# Patient Record
Sex: Female | Born: 1935 | Race: White | Hispanic: No | Smoking: Former smoker
Health system: Southern US, Community
[De-identification: ages and names within clinical notes are randomized; demographics above are authoritative.]

## PROBLEM LIST (undated history)

## (undated) DIAGNOSIS — I10 Essential (primary) hypertension: Secondary | ICD-10-CM

## (undated) DIAGNOSIS — M1990 Unspecified osteoarthritis, unspecified site: Secondary | ICD-10-CM

## (undated) DIAGNOSIS — E785 Hyperlipidemia, unspecified: Secondary | ICD-10-CM

## (undated) DIAGNOSIS — E119 Type 2 diabetes mellitus without complications: Secondary | ICD-10-CM

## (undated) DIAGNOSIS — R079 Chest pain, unspecified: Secondary | ICD-10-CM

## (undated) DIAGNOSIS — K529 Noninfective gastroenteritis and colitis, unspecified: Secondary | ICD-10-CM

## (undated) DIAGNOSIS — Z9889 Other specified postprocedural states: Secondary | ICD-10-CM

## (undated) DIAGNOSIS — R112 Nausea with vomiting, unspecified: Secondary | ICD-10-CM

## (undated) DIAGNOSIS — M109 Gout, unspecified: Secondary | ICD-10-CM

## (undated) HISTORY — DX: Chest pain, unspecified: R07.9

## (undated) HISTORY — PX: PARATHYROIDECTOMY: SHX19

## (undated) HISTORY — PX: VAGINAL HYSTERECTOMY: SUR661

## (undated) HISTORY — PX: APPENDECTOMY: SHX54

## (undated) HISTORY — PX: CHOLECYSTECTOMY: SHX55

## (undated) HISTORY — DX: Hyperlipidemia, unspecified: E78.5

## (undated) HISTORY — PX: CARPAL TUNNEL RELEASE: SHX101

## (undated) HISTORY — PX: TONSILLECTOMY: SUR1361

## (undated) HISTORY — PX: CATARACT EXTRACTION W/ INTRAOCULAR LENS  IMPLANT, BILATERAL: SHX1307

## (undated) HISTORY — PX: KNEE ARTHROSCOPY: SHX127

---

## 1997-07-05 ENCOUNTER — Other Ambulatory Visit: Admission: RE | Admit: 1997-07-05 | Discharge: 1997-07-05 | Payer: Self-pay | Admitting: Internal Medicine

## 1997-07-26 ENCOUNTER — Ambulatory Visit (HOSPITAL_COMMUNITY): Admission: RE | Admit: 1997-07-26 | Discharge: 1997-07-26 | Payer: Self-pay | Admitting: Gastroenterology

## 1998-04-03 ENCOUNTER — Encounter: Payer: Self-pay | Admitting: Surgery

## 1998-04-05 ENCOUNTER — Ambulatory Visit (HOSPITAL_COMMUNITY): Admission: RE | Admit: 1998-04-05 | Discharge: 1998-04-06 | Payer: Self-pay | Admitting: Surgery

## 1998-04-05 ENCOUNTER — Encounter: Payer: Self-pay | Admitting: Surgery

## 1998-04-07 ENCOUNTER — Emergency Department (HOSPITAL_COMMUNITY): Admission: EM | Admit: 1998-04-07 | Discharge: 1998-04-07 | Payer: Self-pay | Admitting: Internal Medicine

## 1999-05-01 ENCOUNTER — Other Ambulatory Visit: Admission: RE | Admit: 1999-05-01 | Discharge: 1999-05-01 | Payer: Self-pay | Admitting: Internal Medicine

## 1999-07-04 ENCOUNTER — Inpatient Hospital Stay (HOSPITAL_COMMUNITY): Admission: RE | Admit: 1999-07-04 | Discharge: 1999-07-05 | Payer: Self-pay | Admitting: Orthopedic Surgery

## 2000-08-22 ENCOUNTER — Emergency Department (HOSPITAL_COMMUNITY): Admission: EM | Admit: 2000-08-22 | Discharge: 2000-08-22 | Payer: Self-pay

## 2000-12-10 ENCOUNTER — Encounter: Payer: Self-pay | Admitting: Orthopedic Surgery

## 2000-12-17 ENCOUNTER — Inpatient Hospital Stay (HOSPITAL_COMMUNITY): Admission: RE | Admit: 2000-12-17 | Discharge: 2000-12-18 | Payer: Self-pay | Admitting: Orthopedic Surgery

## 2001-01-11 ENCOUNTER — Encounter: Admission: RE | Admit: 2001-01-11 | Discharge: 2001-02-24 | Payer: Self-pay | Admitting: Orthopedic Surgery

## 2008-01-14 ENCOUNTER — Emergency Department (HOSPITAL_COMMUNITY): Admission: EM | Admit: 2008-01-14 | Discharge: 2008-01-14 | Payer: Self-pay | Admitting: Emergency Medicine

## 2008-06-16 ENCOUNTER — Encounter: Payer: Self-pay | Admitting: Gastroenterology

## 2010-06-14 NOTE — H&P (Signed)
[facility]  Patient:    Allison Duffy, Allison Duffy. Visit Number: [id number] MRN: [id number]          Service Type: Attending:  Georges Lynch. Darrelyn Hillock, M.D. Dictated by:   Druscilla Brownie. Shela Nevin, P.A. Adm. Date:  12/17/00   CC:         Richard A. Jacky Kindle, M.D.   History and Physical  DATE OF BIRTH:  10-08-1926. DATE OF HISTORY AND PHYSICAL:  December 10, 2000.  CHIEF COMPLAINT:  "Pain in my right shoulder."  HISTORY OF PRESENT ILLNESS:  This 77 year old lady has been seen by Dr. Darrelyn Hillock for continuing and progressive problems concerning the right shoulder. She has had a fall some time ago and has had persistent pain to the right shoulder.  MRI has shown a severe impingement syndrome.  She has limitation of the range of motion, particularly on abduction.  She has had previous left shoulder surgery.  She also has knee problems.  We plan to do a total knee replacement arthroplasty in the future; however, she will need to have her shoulders in good shape in order to walk on a walker postoperatively, so it was decided to go ahead with decompression of the impingement of the right rotator cuff and examination of the cuff during surgery and then decide if repair is indicated.  The patient has been cleared preoperatively by Dr. Geoffry Paradise for surgery.  PAST MEDICAL HISTORY:  Hysterectomy in 1969.  Cholecystectomy in 1975. Parathyroid excision in 1995.  Medically, she has hypertension.  ALLERGIES:  CODEINE and SULFA.  MEDICATIONS:  Lotrel 5/10 one q.d., Premarin 0.625 mg one q.d., calcium, magnesium, and zinc 2000 units per day, Vioxx 25 mg one q.d.  SOCIAL HISTORY:  The patient neither smokes nor drinks.  FAMILY HISTORY:  Positive for heart disease in her mother and father, diabetes in her mother and father, emphysema in the father.  REVIEW OF SYSTEMS:  CENTRAL NERVOUS SYSTEM:  No seizure disorder, paralysis, numbness, or double vision.  RESPIRATORY:  No  productive cough or hemoptysis, no shortness of breath.  CARDIOVASCULAR:  No chest pain, no angina, no orthopnea.  GASTROINTESTINAL:  No nausea, vomiting, melena, or bloody stools. GENITOURINARY:  No discharge, dysuria, or hematuria.  MUSCULOSKELETAL: Primarily in present illness with her right shoulder and knee.  PHYSICAL EXAMINATION:  VITAL SIGNS:  Blood pressure 150/82, pulse 82, respirations are 12.  GENERAL:  Alert and cooperative, friendly 77 year old white female.  HEENT:  Normocephalic, PERRLA, EOMs intact.  Oropharynx is clear.  CHEST:  Clear to auscultation.  No rhonchi, no rales.  CARDIAC:  Regular rate and rhythm.  No murmurs are heard.  ABDOMEN:  Soft, nontender.  Liver and spleen not felt.  GENITALIA, RECTAL, PELVIC, BREASTS:  Not done, not pertinent to present illness.  EXTREMITIES:  Right shoulder as in present illness above.  ADMITTING DIAGNOSES: 1. Impingement, right shoulder, possible tear of the rotator cuff. 2. Hypertension.  PLAN:  The patient will undergo subacromial decompression with possible repair of the right rotator cuff.  If she has any medical problems, we will certainly ask Dr. Jacky Kindle to follow along with [place] during this patients hospitalization. Dictated by:   Druscilla Brownie. Shela Nevin, P.A. Attending:  Georges Lynch. Darrelyn Hillock, M.D. DD:  12/10/00 TD:  12/10/00 Job: [id number]

## 2010-06-14 NOTE — Op Note (Signed)
[facility]  Patient:    MAXINE, FREDMAN [facility] Visit Number: [id number] MRN: [id number]          Service Type: SUR Location: 4W [id number] 01 Attending Physician:  Skip Mayer Dictated by:   Georges Lynch Darrelyn Hillock, M.D. Proc. Date: 12/17/00 Admit Date:  12/17/2000                             Operative Report  PREOPERATIVE DIAGNOSES: 1. Severe impingement syndrome, right shoulder. 2. Rule out possibility of a tear of the rotator cuff. 3. Chronic subdeltoid bursitis, right shoulder.  POSTOPERATIVE DIAGNOSES: 1. Severe impingement syndrome, right shoulder. 2. Rule out possibility of tear of rotator cuff, rotator cuff intact. 3. Chronic subdeltoid bursitis, right shoulder.  OPERATION: 1. Open decompression of the right shoulder by performing an acromionectomy    and acromioplasty. 2. Exploration of rotator cuff on the right. 3. Excision of subdeltoid bursa on the right.  SURGEON:  Georges Lynch. Darrelyn Hillock, M.D.  ASSISTANTAlinda Money ________, P.A.  DESCRIPTION OF PROCEDURE:  Under general anesthesia, routine orthopedic prep and draping of the right shoulder was carried out.  The patient had 1 g of IV Ancef preoperative.  At this time, the incision was made over the anterior aspect of the right shoulder.  Bleeders were identified and cauterized.  I then stripped the deltoid tendon from the acromion in the usual fashion.  I split the deltoid muscle proximally.  I then went down and inserted my self-retaining retractors and cauterized the bleeders.  At this time, I noted that she had severe down sloping of her acromion.  There literally was no space between the acromion and the shoulder per se.  I then inserted a Bennett retractor and did a partial acromionectomy utilizing the oscillating saw.  I then utilized the bur to even out the acromion cut.  I thoroughly irrigated out the area.  I bone waxed the raw end of the acromion where we made our bone cut.  I  thoroughly irrigated out the area again.  I inserted some Gelfoam posteriorly.  I ______ the subdeltoid bursa and I did explore the rotator cuff and it was intact.  It was slightly abraded, but intact.  I then reapproximated the deltoid tendon and muscle in the usual fashion.  The remaining part of the wound was closed in the usual fashion.  Sterile dressings were applied and the patient was placed in a sling. Dictated by:   Georges Lynch Darrelyn Hillock, M.D. Attending Physician:  Skip Mayer DD:  12/17/00 TD:  12/18/00 Job: [id number]

## 2010-06-14 NOTE — Discharge Summary (Signed)
[facility]  Patient:    Allison Duffy, Allison Duffy                     MRN: [id number] Adm. Date:  [id number] Disc. Date: [id number] Attending:  Skip Mayer                           Discharge Summary  ADMISSION DIAGNOSES: 1.  Left rotator cuff tear. 2.  Hypertension. 3.  Osteoporosis.  DISCHARGE DIAGNOSES: 1.  Severe impingement syndrome, left shoulder with abrasion rotator cuff     tear. 2.  Severe chronic subdeltoid bursitis of the shoulder, status post partial     acromionectomy and acromioplasty and decompression of the left shoulder     and excision of large chronic subdeltoid bursa. 3.  Hypertension. 4.  Osteoporosis.  PROCEDURE:  The patient was taken to the operating room on July 04, 1999 and underwent a partial acromionectomy and acromioplasty in order to decompress the left shoulder.  She also underwent an excision of a large chronic subdeltoid bursa.  The surgeon was Dr. Worthy Rancher and assistant was Maud Deed, P.A.C.  The surgery was done under general anesthesia.  CONSULTS:  None.  BRIEF HISTORY:  The patient is a 77 year old female who has been having approximately one month history of left shoulder pain and dysfunction.  She denies any injury, accident or trauma.  She has complained of difficulty getting dressed with overhead activities.  MRI showed small tears of the rotator cuff.  It was felt that she would benefit from surgical intervention. The risks and benefits of the procedure were discussed with the patient.  She was subsequently admitted to [facility].  LABORATORY:  CBC on admission showed a hemoglobin 12.8, hematocrit 37.7, white blood cell count of 9.7, red blood cell count 3.97.  Differential showed monos slightly low at 5, otherwise differential was within normal limits.  PT and PTT on admission were 13.6 and 24 respectively with an INR of 1.1.  Chem panel on admission revealed a slightly elevated  glucose at 126, slightly decreased potassium at 3.2.  The remaining chem panel was all within normal limits.  Urinalysis was negative.  I do not see a chest x-ray or EKG report on this chart at this time.  HOSPITAL COURSE:  The patient was admitted to the [facility] and taken to the operating room and underwent the above stated procedure without complications.  The patient tolerated the procedure well.  Later, she was transferred to the recovery room and then to the orthopedic floor to continue postoperative care.  The patient was placed on p.o. and intramuscularly analgesics for pain control.  The patient was feeling quite comfortable following surgery.  On postoperative day number one, the patient was doing well, had been up and about, voiding okay and is ready for discharge home.  DISCHARGE PLAN:  The patient was discharged home on July 05, 1999.  MEDICATIONS ON DISCHARGE: 1.  Percocet, #40, p.r.n. pain. 2.  Robaxin 500 mg, #40, p.r.n. spasm.  DIET:  As tolerated.  ACTIVITY:  Up ad-lib.  Shower five days after surgery.  FOLLOW-UP:  The patient is to follow-up in two weeks.  DISPOSITION:  Home.  CONDITION ON DISCHARGE:  Improved. DD:  07/29/99 TD:  07/29/99 Job: [id number]

## 2010-06-14 NOTE — Op Note (Signed)
[name]  Patient:    Allison Duffy, Allison Duffy                     MRN: [id number] Proc. Date: 07/04/99 Adm. Date:  [id number] Disc. Date: [id number] Attending:  Skip Mayer CC:         Georges Lynch. Darrelyn Hillock, M.D.                           Operative Report  SURGEON:  Georges Lynch. Darrelyn Hillock, M.D.  ASSISTANT:  Della Goo, P.A.  PREOPERATIVE DIAGNOSES: 1. Severe impingement syndrome of the left shoulder with abrasion of her    rotator cuff tendon. 2. Severe chronic subdeltoid bursitis - left shoulder.  POSTOPERATIVE DIAGNOSES: 1. Severe impingement syndrome of the left shoulder with abrasion of her    rotator cuff tendon. 2. Severe chronic subdeltoid bursitis - left shoulder.  OPERATION: 1. Partial acromionectomy and acromioplasty in order to decompress her left    shoulder. 2. Excision of a large chronic subdeltoid bursa.  PROCEDURE:  Under general anesthesia, a routine orthopedic prep and draping of the left shoulder was carried out.  At this time, a incision was made over the anterior aspect of the left shoulder.  Bleeders were identified and cauterized.  She had 1 g of IV Ancef preoperatively.  Following this, I stripped the deltoid tendon by sharp dissection from the acromion.  At this particular time, we noted there was a severe impingement syndrome and she had literally abraded her rotator cuff, but did not tear the cuff.  So, we then protected the cuff, first identified the subdeltoid bursa.  She had a chronic bursitis.  I excised the subdeltoid bursa.  The bursa was then removed as I stated.  We then inserted the Bennett retractor.  Once the [facility] retractor was inserted up under the acromion, I did a partial acromionectomy with the oscillating saw.  I then utilized the bur to complete the acromioplasty.   We now had good freedom of motion of the shoulder.  The tendon was slightly abraded, but not torn and no repair was necessary.   Thoroughly irrigated out the area.  We bone waxed the acromion.  We then reapproximated the deltoid tendon and the muscle in the usual fashion.  Subcu was closed with 0 Vicryl and skin was closed with metal staples.  I injected about 10 cc of 0.5% Marcaine and epinephrine into the shoulder.  After sterile dressing was applied, she was placed in a sling. DD:  07/04/99 TD:  07/08/99 Job: [id number]

## 2010-06-14 NOTE — H&P (Signed)
[facility]  Patient:    Allison Duffy, Allison Duffy                       MRN: [id number] Adm. Date:  07/04/99 Attending:  Georges Lynch. Darrelyn Hillock, M.D. Dictator:   Grayland Jack, P.A.                         History and Physical  DATE OF BIRTH:  [birth year]  CHIEF COMPLAINT:  Left shoulder pain.  HISTORY OF PRESENT ILLNESS:  Allison Duffy is a 77 year old female with an approximately one-month history of left shoulder pain and dysfunction.  She denies any injury or trauma to the area.  She does complain of difficulty getting dressed and any overhead activities.  MRIs reveal multiple small tears of the rotator cuff. It is felt that the patient would best benefit from surgical intervention.  The  risks, benefits, and complications of surgery were discussed with the patient in detail, and she agreed to proceed.  ALLERGIES:  CODEINE.  CURRENT MEDICATIONS: 1. Lotrel 5/10 one tablet p.o. q.d. 2. Premarin 0.625 mg p.o. q.d. 3. Celebrex 200 mg p.o. q.d. 4. Fosamax 70 mg p.o. once q. week. 5. Calcium 1200 mg p.o. q.d. 6. Vitamin A, E, and D p.o. q.d. 7. Please note, the patient was recently on a dosepak for poison oak, taking    her last dose on Tuesday, Jun 25, 1999.  PAST MEDICAL HISTORY: 1. Significant for hypertension. 2. Osteoporosis.  PAST SURGICAL HISTORY: 1. Significant for a hysterectomy. 2. Cholecystectomy. 3. Parathyroid surgery. 4. A bladder tack.  SOCIAL HISTORY:  The patient is a widow.  She currently lives alone.  She states that her daughter will come and stay with her over the weekend.  She denies any  tobacco or alcohol use.  FAMILY HISTORY:  Significant diabetes mellitus and heart disease.  REVIEW OF SYSTEMS:   GENERAL:  The patient denies fever, chills, weight changes, or malaise.  HEENT:  The patient denies headaches, visual changes, ear pain or pressure, nasal congestion or discharge, sore throats, or dysphagia. RESPIRATORY: The  patient denies a productive cough, hemoptysis, or shortness of breath. CARDIAC:  The patient denies chest pain, palpitations, angina, or dyspnea on exertion.  GI:  The patient denies abdominal pain, nausea, vomiting, constipation, diarrhea, or bloody stools.  GU:  The patient denies dysuria, hematuria, increase in frequency or hesitancy.  MUSCULOSKELETAL:  See the HPI.  HEMATOLOGIC:  The patient denies anemia or bleeding tendencies.  NEURO:  The patient denies a history of TIAs or CVAs.  PHYSICAL EXAMINATION:  VITAL SIGNS:  Temperature afebrile, pulse 78, respirations 18, blood pressure 148/90.  GENERAL:  This is a well-developed, well-nourished 77 year old female, in no acute distress.  HEENT:  The patient is normocephalic, atraumatic.  The patient does use corrective lenses.  Pupils equal, round, reactive to light.  Extraocular muscles intact.  NECK:  Supple, no jugular venous distention, no lymphadenopathy, no carotid bruits appreciated.  CHEST:  Clear to auscultation.  No rales, no rhonchi, no wheezing bilaterally.  HEART:  A regular rate and rhythm, no rubs, gallops, or murmurs appreciated.  ABDOMEN:  Positive bowel sounds, soft, nontender.  No organomegaly appreciated.  BREASTS/RECTAL/GU:  Not done, not pertinent to the present illness.  EXTREMITIES:  Sensation intact to bilateral upper extremities.  Radial pulses 2+ bilaterally.  The patient has an extremely decreased active range of motion of he left shoulder, secondary  to pain.  IMPRESSION: 1. Left rotator cuff tear, multiple. 2. Hypertension. 3. Osteoporosis.  PLAN:  This patient will be admitted to [facility] on June , 2001, to undergo a left rotator cuff repair with Dr. Georges Lynch. Gioffre.  The postoperative course, including physical therapy, has been discussed with the patient in detail.  All questions have been encouraged and answered. DD:  06/27/99 TD:  06/27/99 Job:  [id number]

## 2010-07-22 NOTE — Procedures (Signed)
Summary: Recall / [place] [facility]  Recall / [facility]   Imported By: Lennie Odor 06/29/2009 15:48:12  _____________________________________________________________________  External Attachment:    Type:   Image     Comment:   External Document

## 2010-08-07 ENCOUNTER — Other Ambulatory Visit: Payer: Self-pay | Admitting: Dermatology

## 2010-11-01 LAB — DIFFERENTIAL
Eosinophils Absolute: 0.1 10*3/uL (ref 0.0–0.7)
Lymphs Abs: 1.7 10*3/uL (ref 0.7–4.0)
Neutro Abs: 5.3 10*3/uL (ref 1.7–7.7)
Neutrophils Relative %: 70 % (ref 43–77)

## 2010-11-01 LAB — POCT CARDIAC MARKERS
CKMB, poc: 6.8 ng/mL (ref 1.0–8.0)
Myoglobin, poc: 116 ng/mL (ref 12–200)

## 2010-11-01 LAB — POCT I-STAT, CHEM 8
Creatinine, Ser: 0.9 mg/dL (ref 0.4–1.2)
HCT: 44 % (ref 36.0–46.0)
Hemoglobin: 15 g/dL (ref 12.0–15.0)
Potassium: 3.5 mEq/L (ref 3.5–5.1)
Sodium: 140 mEq/L (ref 135–145)

## 2010-11-01 LAB — URINALYSIS, ROUTINE W REFLEX MICROSCOPIC
Hgb urine dipstick: NEGATIVE
Nitrite: NEGATIVE
Protein, ur: NEGATIVE mg/dL
Urobilinogen, UA: 0.2 mg/dL (ref 0.0–1.0)

## 2010-11-01 LAB — CBC
MCV: 98.2 fL (ref 78.0–100.0)
Platelets: 214 10*3/uL (ref 150–400)
WBC: 7.6 10*3/uL (ref 4.0–10.5)

## 2011-02-03 DIAGNOSIS — IMO0002 Reserved for concepts with insufficient information to code with codable children: Secondary | ICD-10-CM | POA: Diagnosis not present

## 2011-02-03 DIAGNOSIS — M5126 Other intervertebral disc displacement, lumbar region: Secondary | ICD-10-CM | POA: Diagnosis not present

## 2011-02-03 DIAGNOSIS — I10 Essential (primary) hypertension: Secondary | ICD-10-CM | POA: Diagnosis not present

## 2011-02-03 DIAGNOSIS — M549 Dorsalgia, unspecified: Secondary | ICD-10-CM | POA: Diagnosis not present

## 2011-02-03 DIAGNOSIS — N182 Chronic kidney disease, stage 2 (mild): Secondary | ICD-10-CM | POA: Diagnosis not present

## 2011-02-03 DIAGNOSIS — E1129 Type 2 diabetes mellitus with other diabetic kidney complication: Secondary | ICD-10-CM | POA: Diagnosis not present

## 2011-02-25 ENCOUNTER — Ambulatory Visit: Payer: BC Managed Care – PPO | Attending: Internal Medicine | Admitting: Physical Therapy

## 2011-02-25 DIAGNOSIS — M545 Low back pain, unspecified: Secondary | ICD-10-CM | POA: Insufficient documentation

## 2011-02-25 DIAGNOSIS — IMO0001 Reserved for inherently not codable concepts without codable children: Secondary | ICD-10-CM | POA: Insufficient documentation

## 2011-03-03 ENCOUNTER — Ambulatory Visit: Payer: Medicare Other | Attending: Internal Medicine | Admitting: Physical Therapy

## 2011-03-03 DIAGNOSIS — M545 Low back pain, unspecified: Secondary | ICD-10-CM | POA: Insufficient documentation

## 2011-03-03 DIAGNOSIS — IMO0001 Reserved for inherently not codable concepts without codable children: Secondary | ICD-10-CM | POA: Diagnosis not present

## 2011-03-05 ENCOUNTER — Ambulatory Visit: Payer: Medicare Other | Admitting: Physical Therapy

## 2011-03-10 ENCOUNTER — Ambulatory Visit: Payer: Medicare Other | Admitting: Physical Therapy

## 2011-03-12 ENCOUNTER — Encounter: Payer: BC Managed Care – PPO | Admitting: Physical Therapy

## 2011-05-05 DIAGNOSIS — E1129 Type 2 diabetes mellitus with other diabetic kidney complication: Secondary | ICD-10-CM | POA: Diagnosis not present

## 2011-05-05 DIAGNOSIS — N182 Chronic kidney disease, stage 2 (mild): Secondary | ICD-10-CM | POA: Diagnosis not present

## 2011-05-05 DIAGNOSIS — E785 Hyperlipidemia, unspecified: Secondary | ICD-10-CM | POA: Diagnosis not present

## 2011-05-05 DIAGNOSIS — I10 Essential (primary) hypertension: Secondary | ICD-10-CM | POA: Diagnosis not present

## 2011-05-12 DIAGNOSIS — Z1231 Encounter for screening mammogram for malignant neoplasm of breast: Secondary | ICD-10-CM | POA: Diagnosis not present

## 2011-09-22 DIAGNOSIS — E1129 Type 2 diabetes mellitus with other diabetic kidney complication: Secondary | ICD-10-CM | POA: Diagnosis not present

## 2011-09-22 DIAGNOSIS — E669 Obesity, unspecified: Secondary | ICD-10-CM | POA: Diagnosis not present

## 2011-09-22 DIAGNOSIS — N182 Chronic kidney disease, stage 2 (mild): Secondary | ICD-10-CM | POA: Diagnosis not present

## 2011-09-22 DIAGNOSIS — E785 Hyperlipidemia, unspecified: Secondary | ICD-10-CM | POA: Diagnosis not present

## 2011-09-30 DIAGNOSIS — H04129 Dry eye syndrome of unspecified lacrimal gland: Secondary | ICD-10-CM | POA: Diagnosis not present

## 2011-09-30 DIAGNOSIS — H3530 Unspecified macular degeneration: Secondary | ICD-10-CM | POA: Diagnosis not present

## 2011-09-30 DIAGNOSIS — Z961 Presence of intraocular lens: Secondary | ICD-10-CM | POA: Diagnosis not present

## 2011-11-18 DIAGNOSIS — Z23 Encounter for immunization: Secondary | ICD-10-CM | POA: Diagnosis not present

## 2012-01-27 DIAGNOSIS — E1129 Type 2 diabetes mellitus with other diabetic kidney complication: Secondary | ICD-10-CM | POA: Diagnosis not present

## 2012-01-27 DIAGNOSIS — E785 Hyperlipidemia, unspecified: Secondary | ICD-10-CM | POA: Diagnosis not present

## 2012-01-27 DIAGNOSIS — I10 Essential (primary) hypertension: Secondary | ICD-10-CM | POA: Diagnosis not present

## 2012-02-02 DIAGNOSIS — Z0000 Encounter for general adult medical examination without abnormal findings: Secondary | ICD-10-CM | POA: Diagnosis not present

## 2012-02-02 DIAGNOSIS — I10 Essential (primary) hypertension: Secondary | ICD-10-CM | POA: Diagnosis not present

## 2012-02-02 DIAGNOSIS — Z1212 Encounter for screening for malignant neoplasm of rectum: Secondary | ICD-10-CM | POA: Diagnosis not present

## 2012-02-02 DIAGNOSIS — E1129 Type 2 diabetes mellitus with other diabetic kidney complication: Secondary | ICD-10-CM | POA: Diagnosis not present

## 2012-02-02 DIAGNOSIS — E669 Obesity, unspecified: Secondary | ICD-10-CM | POA: Diagnosis not present

## 2012-04-30 DIAGNOSIS — M19039 Primary osteoarthritis, unspecified wrist: Secondary | ICD-10-CM | POA: Diagnosis not present

## 2012-05-08 ENCOUNTER — Emergency Department (HOSPITAL_COMMUNITY): Payer: Medicare Other

## 2012-05-08 ENCOUNTER — Emergency Department (HOSPITAL_COMMUNITY)
Admission: EM | Admit: 2012-05-08 | Discharge: 2012-05-09 | Disposition: A | Discharge status: Home / Self Care | Payer: Medicare Other | Attending: Emergency Medicine | Admitting: Emergency Medicine

## 2012-05-08 ENCOUNTER — Encounter (HOSPITAL_COMMUNITY): Payer: Self-pay | Admitting: Emergency Medicine

## 2012-05-08 DIAGNOSIS — I10 Essential (primary) hypertension: Secondary | ICD-10-CM | POA: Diagnosis not present

## 2012-05-08 DIAGNOSIS — E119 Type 2 diabetes mellitus without complications: Secondary | ICD-10-CM | POA: Diagnosis not present

## 2012-05-08 DIAGNOSIS — M79609 Pain in unspecified limb: Secondary | ICD-10-CM | POA: Diagnosis not present

## 2012-05-08 DIAGNOSIS — L03116 Cellulitis of left lower limb: Secondary | ICD-10-CM

## 2012-05-08 DIAGNOSIS — L03119 Cellulitis of unspecified part of limb: Secondary | ICD-10-CM | POA: Diagnosis not present

## 2012-05-08 DIAGNOSIS — S99919A Unspecified injury of unspecified ankle, initial encounter: Secondary | ICD-10-CM | POA: Diagnosis not present

## 2012-05-08 DIAGNOSIS — L02619 Cutaneous abscess of unspecified foot: Secondary | ICD-10-CM | POA: Diagnosis not present

## 2012-05-08 HISTORY — DX: Essential (primary) hypertension: I10

## 2012-05-08 LAB — CBC WITH DIFFERENTIAL/PLATELET
Basophils Absolute: 0.1 10*3/uL (ref 0.0–0.1)
Basophils Relative: 1 % (ref 0–1)
Eosinophils Absolute: 0.2 10*3/uL (ref 0.0–0.7)
Eosinophils Relative: 2 % (ref 0–5)
HCT: 40 % (ref 36.0–46.0)
Hemoglobin: 14.3 g/dL (ref 12.0–15.0)
Lymphocytes Relative: 37 % (ref 12–46)
Lymphs Abs: 3.5 10*3/uL (ref 0.7–4.0)
MCH: 33.3 pg (ref 26.0–34.0)
MCHC: 35.8 g/dL (ref 30.0–36.0)
MCV: 93 fL (ref 78.0–100.0)
Monocytes Absolute: 0.8 10*3/uL (ref 0.1–1.0)
Monocytes Relative: 9 % (ref 3–12)
Neutro Abs: 4.9 10*3/uL (ref 1.7–7.7)
Neutrophils Relative %: 52 % (ref 43–77)
Platelets: 230 10*3/uL (ref 150–400)
RBC: 4.3 MIL/uL (ref 3.87–5.11)
RDW: 12.1 % (ref 11.5–15.5)
WBC: 9.5 10*3/uL (ref 4.0–10.5)

## 2012-05-08 LAB — BASIC METABOLIC PANEL
BUN: 21 mg/dL (ref 6–23)
CO2: 25 mEq/L (ref 19–32)
Calcium: 9.8 mg/dL (ref 8.4–10.5)
Chloride: 98 mEq/L (ref 96–112)
Creatinine, Ser: 0.73 mg/dL (ref 0.50–1.10)
GFR calc Af Amer: 89 mL/min — ABNORMAL LOW (ref >90)
GFR calc non Af Amer: 77 mL/min — ABNORMAL LOW (ref >90)
Glucose, Bld: 156 mg/dL — ABNORMAL HIGH (ref 70–99)
Potassium: 4 mEq/L (ref 3.5–5.1)
Sodium: 138 mEq/L (ref 135–145)

## 2012-05-08 MED ORDER — CLINDAMYCIN PHOSPHATE 600 MG/50ML IV SOLN
600.0000 mg | Freq: Once | INTRAVENOUS | Status: AC
  Administered 2012-05-08: 600 mg via INTRAVENOUS
  Filled 2012-05-08: qty 50

## 2012-05-08 MED ORDER — ACETAMINOPHEN 325 MG PO TABS
650.0000 mg | ORAL_TABLET | Freq: Once | ORAL | Status: AC
  Administered 2012-05-08: 650 mg via ORAL
  Filled 2012-05-08: qty 2

## 2012-05-08 NOTE — ED Provider Notes (Signed)
History     CSN: [id number]  Arrival date & time 05/08/12  4098   First MD Initiated Contact with Patient 05/08/12 1859      Chief Complaint  Patient presents with  . Foot Injury    (Consider location/radiation/quality/duration/timing/severity/associated sxs/prior treatment) HPI Patient presents emergency department with left heel pain.  Patient, states, that she had a neighbor looked at her heel and there was a reddened area with a center area of fluid.  Patient called her doctor, who advised her to come to the emergency Department for evaluation.  Patient is a diabetic.  Patient denies chest pain, shortness of breath, fever, abdominal pain, nausea, vomiting, diarrhea, numbness, weakness, headache, blurred vision, or syncope.  Patient does not recall an injury to her foot.  She states that she does not recall stepping on any objects.  Patient, states, that palpation makes the pain, worse in standing on the area. Past Medical History  Diagnosis Date  . Hypertension   . Diabetes mellitus without complication     History reviewed. No pertinent past surgical history.  History reviewed. No pertinent family history.  History  Substance Use Topics  . Smoking status: Never Smoker   . Smokeless tobacco: Not on file  . Alcohol Use: No    OB History   Grav Para Term Preterm Abortions TAB SAB Ect Mult Living                  Review of Systems All other systems negative except as documented in the HPI. All pertinent positives and negatives as reviewed in the HPI. Allergies  Review of patient's allergies indicates not on file.  Home Medications  No current outpatient prescriptions on file.  BP 192/80  Pulse 82  Temp(Src) 99.3 F (37.4 C) (Oral)  Resp 20  SpO2 94%  Physical Exam  Nursing note and vitals reviewed. Constitutional: She is oriented to person, place, and time. She appears well-developed and well-nourished.  HENT:  Head: Normocephalic and atraumatic.  Eyes:  Pupils are equal, round, and reactive to light.  Neck: Normal range of motion. Neck supple.  Cardiovascular: Normal rate, regular rhythm and normal heart sounds.   Pulmonary/Chest: Effort normal and breath sounds normal.  Musculoskeletal:       Feet:  Neurological: She is alert and oriented to person, place, and time.  Skin: Skin is warm and dry.    ED Course  Procedures (including critical care time) Patient retreated for cellulitis of her left heel.  The area of the center portion that is somewhat fluid filled has drained out.  The patient will be advised to followup with her primary care as soon as possible.  Told to return here for any worsening in her condition.  Patient is advised of her heel in warm water.  Patient is a fairly healthy 79 year old lady.  She is agreeable to the plan and all questions were answered   MDM          Carlyle Dolly, PA-C 05/10/12 0123

## 2012-05-08 NOTE — ED Notes (Signed)
Pt states she has had pain on bottom of L heel for one week.  Pt states she had a friend look at the bottom of her foot.  Friend called Dr. Evlyn Kanner and was told to come here.  Pt has 1cm ulceration to bottom of L heel draining clear drainage.  Pt states she has hx of diabetes and has a hard time looking at the bottom of her foot.

## 2012-05-09 MED ORDER — CLINDAMYCIN HCL 300 MG PO CAPS
300.0000 mg | ORAL_CAPSULE | Freq: Three times a day (TID) | ORAL | Status: DC
Start: 2012-05-09 — End: 2013-07-05

## 2012-05-10 DIAGNOSIS — L089 Local infection of the skin and subcutaneous tissue, unspecified: Secondary | ICD-10-CM | POA: Diagnosis not present

## 2012-05-10 DIAGNOSIS — T148XXA Other injury of unspecified body region, initial encounter: Secondary | ICD-10-CM | POA: Diagnosis not present

## 2012-05-10 DIAGNOSIS — Z6831 Body mass index (BMI) 31.0-31.9, adult: Secondary | ICD-10-CM | POA: Diagnosis not present

## 2012-05-10 DIAGNOSIS — E1129 Type 2 diabetes mellitus with other diabetic kidney complication: Secondary | ICD-10-CM | POA: Diagnosis not present

## 2012-05-11 NOTE — ED Provider Notes (Signed)
Medical screening examination/treatment/procedure(s) were performed by non-physician practitioner and as supervising physician I was immediately available for consultation/collaboration.  Takiera Mayo, MD 05/11/12 1107 

## 2012-05-24 DIAGNOSIS — L84 Corns and callosities: Secondary | ICD-10-CM | POA: Diagnosis not present

## 2012-05-24 DIAGNOSIS — Z6831 Body mass index (BMI) 31.0-31.9, adult: Secondary | ICD-10-CM | POA: Diagnosis not present

## 2012-05-24 DIAGNOSIS — E1129 Type 2 diabetes mellitus with other diabetic kidney complication: Secondary | ICD-10-CM | POA: Diagnosis not present

## 2012-05-28 DIAGNOSIS — L84 Corns and callosities: Secondary | ICD-10-CM | POA: Diagnosis not present

## 2012-05-28 DIAGNOSIS — E1129 Type 2 diabetes mellitus with other diabetic kidney complication: Secondary | ICD-10-CM | POA: Diagnosis not present

## 2012-05-28 DIAGNOSIS — Z6831 Body mass index (BMI) 31.0-31.9, adult: Secondary | ICD-10-CM | POA: Diagnosis not present

## 2012-06-07 DIAGNOSIS — E1129 Type 2 diabetes mellitus with other diabetic kidney complication: Secondary | ICD-10-CM | POA: Diagnosis not present

## 2012-06-07 DIAGNOSIS — E785 Hyperlipidemia, unspecified: Secondary | ICD-10-CM | POA: Diagnosis not present

## 2012-06-07 DIAGNOSIS — I10 Essential (primary) hypertension: Secondary | ICD-10-CM | POA: Diagnosis not present

## 2012-06-07 DIAGNOSIS — M1990 Unspecified osteoarthritis, unspecified site: Secondary | ICD-10-CM | POA: Diagnosis not present

## 2012-06-07 DIAGNOSIS — N182 Chronic kidney disease, stage 2 (mild): Secondary | ICD-10-CM | POA: Diagnosis not present

## 2012-06-07 DIAGNOSIS — Z6831 Body mass index (BMI) 31.0-31.9, adult: Secondary | ICD-10-CM | POA: Diagnosis not present

## 2012-06-07 DIAGNOSIS — E213 Hyperparathyroidism, unspecified: Secondary | ICD-10-CM | POA: Diagnosis not present

## 2012-06-07 DIAGNOSIS — E669 Obesity, unspecified: Secondary | ICD-10-CM | POA: Diagnosis not present

## 2012-06-14 DIAGNOSIS — Z1231 Encounter for screening mammogram for malignant neoplasm of breast: Secondary | ICD-10-CM | POA: Diagnosis not present

## 2012-10-01 DIAGNOSIS — H18419 Arcus senilis, unspecified eye: Secondary | ICD-10-CM | POA: Diagnosis not present

## 2012-10-01 DIAGNOSIS — Z961 Presence of intraocular lens: Secondary | ICD-10-CM | POA: Diagnosis not present

## 2012-10-01 DIAGNOSIS — E119 Type 2 diabetes mellitus without complications: Secondary | ICD-10-CM | POA: Diagnosis not present

## 2012-10-01 DIAGNOSIS — H3530 Unspecified macular degeneration: Secondary | ICD-10-CM | POA: Diagnosis not present

## 2012-10-15 DIAGNOSIS — E213 Hyperparathyroidism, unspecified: Secondary | ICD-10-CM | POA: Diagnosis not present

## 2012-10-15 DIAGNOSIS — J309 Allergic rhinitis, unspecified: Secondary | ICD-10-CM | POA: Diagnosis not present

## 2012-10-15 DIAGNOSIS — Z1331 Encounter for screening for depression: Secondary | ICD-10-CM | POA: Diagnosis not present

## 2012-10-15 DIAGNOSIS — N182 Chronic kidney disease, stage 2 (mild): Secondary | ICD-10-CM | POA: Diagnosis not present

## 2012-10-15 DIAGNOSIS — E785 Hyperlipidemia, unspecified: Secondary | ICD-10-CM | POA: Diagnosis not present

## 2012-10-15 DIAGNOSIS — Z23 Encounter for immunization: Secondary | ICD-10-CM | POA: Diagnosis not present

## 2012-10-15 DIAGNOSIS — E1129 Type 2 diabetes mellitus with other diabetic kidney complication: Secondary | ICD-10-CM | POA: Diagnosis not present

## 2012-10-15 DIAGNOSIS — I10 Essential (primary) hypertension: Secondary | ICD-10-CM | POA: Diagnosis not present

## 2012-11-04 DIAGNOSIS — I10 Essential (primary) hypertension: Secondary | ICD-10-CM | POA: Diagnosis not present

## 2012-11-04 DIAGNOSIS — N182 Chronic kidney disease, stage 2 (mild): Secondary | ICD-10-CM | POA: Diagnosis not present

## 2012-11-04 DIAGNOSIS — E1129 Type 2 diabetes mellitus with other diabetic kidney complication: Secondary | ICD-10-CM | POA: Diagnosis not present

## 2012-11-04 DIAGNOSIS — Z6832 Body mass index (BMI) 32.0-32.9, adult: Secondary | ICD-10-CM | POA: Diagnosis not present

## 2013-02-04 DIAGNOSIS — I10 Essential (primary) hypertension: Secondary | ICD-10-CM | POA: Diagnosis not present

## 2013-02-04 DIAGNOSIS — E1129 Type 2 diabetes mellitus with other diabetic kidney complication: Secondary | ICD-10-CM | POA: Diagnosis not present

## 2013-02-04 DIAGNOSIS — E785 Hyperlipidemia, unspecified: Secondary | ICD-10-CM | POA: Diagnosis not present

## 2013-02-07 DIAGNOSIS — E213 Hyperparathyroidism, unspecified: Secondary | ICD-10-CM | POA: Diagnosis not present

## 2013-02-07 DIAGNOSIS — I10 Essential (primary) hypertension: Secondary | ICD-10-CM | POA: Diagnosis not present

## 2013-02-07 DIAGNOSIS — Z6832 Body mass index (BMI) 32.0-32.9, adult: Secondary | ICD-10-CM | POA: Diagnosis not present

## 2013-02-07 DIAGNOSIS — E669 Obesity, unspecified: Secondary | ICD-10-CM | POA: Diagnosis not present

## 2013-02-07 DIAGNOSIS — E1129 Type 2 diabetes mellitus with other diabetic kidney complication: Secondary | ICD-10-CM | POA: Diagnosis not present

## 2013-02-07 DIAGNOSIS — Z0000 Encounter for general adult medical examination without abnormal findings: Secondary | ICD-10-CM | POA: Diagnosis not present

## 2013-02-07 DIAGNOSIS — E785 Hyperlipidemia, unspecified: Secondary | ICD-10-CM | POA: Diagnosis not present

## 2013-02-07 DIAGNOSIS — Z1212 Encounter for screening for malignant neoplasm of rectum: Secondary | ICD-10-CM | POA: Diagnosis not present

## 2013-02-07 DIAGNOSIS — N182 Chronic kidney disease, stage 2 (mild): Secondary | ICD-10-CM | POA: Diagnosis not present

## 2013-03-03 ENCOUNTER — Other Ambulatory Visit: Payer: Self-pay | Admitting: Dermatology

## 2013-03-03 DIAGNOSIS — D485 Neoplasm of uncertain behavior of skin: Secondary | ICD-10-CM | POA: Diagnosis not present

## 2013-03-03 DIAGNOSIS — C4441 Basal cell carcinoma of skin of scalp and neck: Secondary | ICD-10-CM | POA: Diagnosis not present

## 2013-06-22 DIAGNOSIS — Z803 Family history of malignant neoplasm of breast: Secondary | ICD-10-CM | POA: Diagnosis not present

## 2013-06-22 DIAGNOSIS — Z1231 Encounter for screening mammogram for malignant neoplasm of breast: Secondary | ICD-10-CM | POA: Diagnosis not present

## 2013-06-27 DIAGNOSIS — Z6831 Body mass index (BMI) 31.0-31.9, adult: Secondary | ICD-10-CM | POA: Diagnosis not present

## 2013-06-27 DIAGNOSIS — E1129 Type 2 diabetes mellitus with other diabetic kidney complication: Secondary | ICD-10-CM | POA: Diagnosis not present

## 2013-06-27 DIAGNOSIS — M1990 Unspecified osteoarthritis, unspecified site: Secondary | ICD-10-CM | POA: Diagnosis not present

## 2013-06-27 DIAGNOSIS — E669 Obesity, unspecified: Secondary | ICD-10-CM | POA: Diagnosis not present

## 2013-06-27 DIAGNOSIS — N182 Chronic kidney disease, stage 2 (mild): Secondary | ICD-10-CM | POA: Diagnosis not present

## 2013-06-27 DIAGNOSIS — I10 Essential (primary) hypertension: Secondary | ICD-10-CM | POA: Diagnosis not present

## 2013-06-27 DIAGNOSIS — E785 Hyperlipidemia, unspecified: Secondary | ICD-10-CM | POA: Diagnosis not present

## 2013-06-27 DIAGNOSIS — E213 Hyperparathyroidism, unspecified: Secondary | ICD-10-CM | POA: Diagnosis not present

## 2013-06-28 DIAGNOSIS — Z803 Family history of malignant neoplasm of breast: Secondary | ICD-10-CM | POA: Diagnosis not present

## 2013-06-28 DIAGNOSIS — N6489 Other specified disorders of breast: Secondary | ICD-10-CM | POA: Diagnosis not present

## 2013-07-05 ENCOUNTER — Encounter: Payer: Self-pay | Admitting: Cardiovascular Disease

## 2013-07-05 ENCOUNTER — Ambulatory Visit (INDEPENDENT_AMBULATORY_CARE_PROVIDER_SITE_OTHER): Payer: Medicare Other | Admitting: Cardiovascular Disease

## 2013-07-05 VITALS — BP 142/82 | HR 70 | Ht 66.0 in | Wt 194.0 lb

## 2013-07-05 DIAGNOSIS — I10 Essential (primary) hypertension: Secondary | ICD-10-CM | POA: Insufficient documentation

## 2013-07-05 DIAGNOSIS — R0602 Shortness of breath: Secondary | ICD-10-CM

## 2013-07-05 DIAGNOSIS — E785 Hyperlipidemia, unspecified: Secondary | ICD-10-CM

## 2013-07-05 DIAGNOSIS — E119 Type 2 diabetes mellitus without complications: Secondary | ICD-10-CM

## 2013-07-05 DIAGNOSIS — R079 Chest pain, unspecified: Secondary | ICD-10-CM | POA: Insufficient documentation

## 2013-07-05 DIAGNOSIS — R0789 Other chest pain: Secondary | ICD-10-CM | POA: Diagnosis not present

## 2013-07-05 DIAGNOSIS — E118 Type 2 diabetes mellitus with unspecified complications: Secondary | ICD-10-CM | POA: Insufficient documentation

## 2013-07-05 DIAGNOSIS — E1169 Type 2 diabetes mellitus with other specified complication: Secondary | ICD-10-CM | POA: Insufficient documentation

## 2013-07-05 MED ORDER — ISOSORBIDE MONONITRATE ER 30 MG PO TB24: 15.0000 mg | ORAL_TABLET | Freq: Every day | ORAL | Status: DC

## 2013-07-05 NOTE — Progress Notes (Signed)
07/05/2013 Allison Duffy   [birth year]  [id number]  Primary Physician ARONSON,RICHARD A, MD Primary Cardiologist: Lorretta Harp MD [name]   HPI:  Ms. Borror is a playful 80 year old mildly overweight widowed Caucasian female mother of 3 living daughters (one deceased), grandmother a grandchildren. Her daughter Allison Duffy accompanies her today who I have seen this patient remotely as well. She was referred by Dr. Reynaldo Duffy for cardiovascular evaluation because of new onset effort angina or shortness of breath and fatigue.her cardiac risk factor profile is notable for treated hypertension, diabetes and hyperlipidemia. She does not smoke. There is no family history. She has never had a heart attack or stroke. Over the last 6 months she's noticed new onset effort angina, dyspnea and fatigue. The symptoms do not occur at rest. They do not awaken her from sleep.   Current Outpatient Prescriptions  Medication Sig Dispense Refill  . acetaminophen (TYLENOL) 325 MG tablet Take 650 mg by mouth every 6 (six) hours as needed for pain.      Marland Kitchen amLODipine (NORVASC) 10 MG tablet Take 10 mg by mouth daily.      Marland Kitchen aspirin 81 MG chewable tablet Chew 81 mg by mouth daily.      . Calcium-Magnesium-Vitamin D (CALCIUM 500 PO) Take 1 tablet by mouth 2 (two) times daily. Vitality Calcium      . glipiZIDE (GLUCOTROL XL) 10 MG 24 hr tablet Take 10 mg by mouth 2 (two) times daily.       . Glucosamine-Chondroitin (GLUCOSAMINE CHONDR COMPLEX PO) Take 2,000 mg by mouth daily.      Marland Kitchen lisinopril (PRINIVIL,ZESTRIL) 40 MG tablet Take 40 mg by mouth daily.      . metFORMIN (GLUCOPHAGE) 500 MG tablet Take 500 mg by mouth daily with breakfast.       . metoprolol tartrate (LOPRESSOR) 25 MG tablet Take 25 mg by mouth 2 (two) times daily.      . Multiple Vitamin (MULTIVITAMIN WITH MINERALS) TABS Take 1 tablet by mouth 2 (two) times daily. Vitality Multivitamin & mineral      . pravastatin (PRAVACHOL) 20 MG tablet  Take 20 mg by mouth daily.      Marland Kitchen Propylene Glycol (SYSTANE BALANCE OP) Apply 1 drop to eye daily as needed. For dry eyes as needed      . sitaGLIPtin (JANUVIA) 100 MG tablet Take 100 mg by mouth at bedtime.      . isosorbide mononitrate (IMDUR) 30 MG 24 hr tablet Take 0.5 tablets (15 mg total) by mouth daily.  15 tablet  6   No current facility-administered medications for this visit.    Allergies  Allergen Reactions  . Codeine Nausea Only  . Sulfa Antibiotics Itching and Nausea Only    History   Social History  . Marital Status: Widowed    Spouse Name: N/A    Number of Children: N/A  . Years of Education: N/A   Occupational History  . Not on file.   Social History Main Topics  . Smoking status: Former Smoker    Quit date: 07/05/1953  . Smokeless tobacco: Not on file  . Alcohol Use: No  . Drug Use: Not on file  . Sexual Activity: Not on file   Other Topics Concern  . Not on file   Social History Narrative  . No narrative on file     Review of Systems: General: negative for chills, fever, night sweats or weight changes.  Cardiovascular: negative for chest  pain, dyspnea on exertion, edema, orthopnea, palpitations, paroxysmal nocturnal dyspnea or shortness of breath Dermatological: negative for rash Respiratory: negative for cough or wheezing Urologic: negative for hematuria Abdominal: negative for nausea, vomiting, diarrhea, bright red blood per rectum, melena, or hematemesis Neurologic: negative for visual changes, syncope, or dizziness All other systems reviewed and are otherwise negative except as noted above.    Blood pressure 142/82, pulse 70, height 5\' 6"  (1.676 m), weight 194 lb (87.998 kg).  General appearance: alert and no distress Neck: no adenopathy, no carotid bruit, no JVD, supple, symmetrical, trachea midline and thyroid not enlarged, symmetric, no tenderness/mass/nodules Lungs: clear to auscultation bilaterally Heart: regular rate and rhythm, S1,  S2 normal, no murmur, click, rub or gallop Extremities: extremities normal, atraumatic, no cyanosis or edema and 2+ pedal pulses bilaterally  EKG normal sinus rhythm at 70 without ST or T wave changes  ASSESSMENT AND PLAN:   Chest pain 80 year old female with multiple cardiac risk factors notable for hypertension, hyperlipidemia and type 2 diabetes all medically treated. She had new onset exertional chest pain of the left 6 months associated with dyspnea and fatigue. These episodes occur weekly and go away with rest. She is already on a beta blocker and a calcium channel blocker. I'm going to put her on a low-dose oral long-acting nitrate and obtain a dermatologic Myoview stress test and 2-D echocardiogram. I will see her back after that for further evaluation  Hyperlipidemia On statin therapy followed by her PCP  Essential hypertension Controlled on current medications      Lorretta Harp MD [facility] 07/05/2013 8:24 AM

## 2013-07-05 NOTE — Patient Instructions (Signed)
  We will see you back in follow up after the tests.  Dr Gwenlyn Found has ordered : 1.  Echocardiogram. Echocardiography is a painless test that uses sound waves to create images of your heart. It provides your doctor with information about the size and shape of your heart and how well your heart's chambers and valves are working. This procedure takes approximately one hour. There are no restrictions for this procedure.   2. Lexiscan Myoview- this is a test that looks at the blood flow to your heart muscle.  It takes approximately 2 1/2 hours. Please follow instruction sheet, as given.   Start Imdur (isosorbide mononitrate) 30mg  1/2 tablet daily

## 2013-07-05 NOTE — Assessment & Plan Note (Signed)
Controlled on current medications 

## 2013-07-05 NOTE — Assessment & Plan Note (Signed)
81 year old female with multiple cardiac risk factors notable for hypertension, hyperlipidemia and type 2 diabetes all medically treated. She had new onset exertional chest pain of the left 6 months associated with dyspnea and fatigue. These episodes occur weekly and go away with rest. She is already on a beta blocker and a calcium channel blocker. I'm going to put her on a low-dose oral long-acting nitrate and obtain a dermatologic Myoview stress test and 2-D echocardiogram. I will see her back after that for further evaluation

## 2013-07-05 NOTE — Assessment & Plan Note (Signed)
On statin therapy followed by her PCP 

## 2013-07-06 ENCOUNTER — Encounter: Payer: Self-pay | Admitting: Cardiovascular Disease

## 2013-07-07 ENCOUNTER — Ambulatory Visit: Payer: Medicare Other | Admitting: Cardiovascular Disease

## 2013-07-15 ENCOUNTER — Telehealth (HOSPITAL_COMMUNITY): Payer: Self-pay

## 2013-07-19 ENCOUNTER — Telehealth (HOSPITAL_COMMUNITY): Payer: Self-pay

## 2013-07-20 ENCOUNTER — Ambulatory Visit (HOSPITAL_COMMUNITY)
Admission: RE | Admit: 2013-07-20 | Discharge: 2013-07-20 | Disposition: A | Discharge status: Home / Self Care | Payer: Medicare Other | Source: Ambulatory Visit | Attending: Cardiology | Admitting: Cardiology

## 2013-07-20 ENCOUNTER — Ambulatory Visit (HOSPITAL_BASED_OUTPATIENT_CLINIC_OR_DEPARTMENT_OTHER)
Admission: RE | Admit: 2013-07-20 | Discharge: 2013-07-20 | Disposition: A | Discharge status: Home / Self Care | Payer: Medicare Other | Source: Ambulatory Visit | Attending: Cardiovascular Disease | Admitting: Cardiovascular Disease

## 2013-07-20 DIAGNOSIS — R42 Dizziness and giddiness: Secondary | ICD-10-CM | POA: Insufficient documentation

## 2013-07-20 DIAGNOSIS — R5383 Other fatigue: Secondary | ICD-10-CM | POA: Diagnosis not present

## 2013-07-20 DIAGNOSIS — R0789 Other chest pain: Secondary | ICD-10-CM | POA: Diagnosis not present

## 2013-07-20 DIAGNOSIS — I517 Cardiomegaly: Secondary | ICD-10-CM | POA: Diagnosis not present

## 2013-07-20 DIAGNOSIS — R5381 Other malaise: Secondary | ICD-10-CM | POA: Insufficient documentation

## 2013-07-20 DIAGNOSIS — R0602 Shortness of breath: Secondary | ICD-10-CM | POA: Insufficient documentation

## 2013-07-20 MED ORDER — AMINOPHYLLINE 25 MG/ML IV SOLN
75.0000 mg | Freq: Once | INTRAVENOUS | Status: AC
  Administered 2013-07-20: 75 mg via INTRAVENOUS

## 2013-07-20 MED ORDER — REGADENOSON 0.4 MG/5ML IV SOLN
0.4000 mg | Freq: Once | INTRAVENOUS | Status: AC
  Administered 2013-07-20: 0.4 mg via INTRAVENOUS

## 2013-07-20 MED ORDER — TECHNETIUM TC 99M SESTAMIBI GENERIC - CARDIOLITE
10.5000 | Freq: Once | INTRAVENOUS | Status: AC | PRN
  Administered 2013-07-20: 11 via INTRAVENOUS

## 2013-07-20 MED ORDER — TECHNETIUM TC 99M SESTAMIBI GENERIC - CARDIOLITE
30.4000 | Freq: Once | INTRAVENOUS | Status: AC | PRN
  Administered 2013-07-20: 30 via INTRAVENOUS

## 2013-07-20 NOTE — Progress Notes (Signed)
2D Echo Performed 07/20/2013    Tammie Crouch, RCS  

## 2013-07-20 NOTE — Procedures (Addendum)
[facility] [address] [place] [address] [phone]  Cardiology Nuclear Med Study  Allison Duffy is a 80 y.o. female     MRN : [id number]     DOB: 1926/12/16  Procedure Date: 07/20/2013  Nuclear Med Background Indication for Stress Test:  Evaluation for Ischemia History:  No prior cardiac or respiratory history reported;No prior NUC MPI for comparison. Cardiac Risk Factors: Family History - CAD, History of Smoking, Hypertension, Lipids, NIDDM and Overweight  Symptoms:  Chest Pain, Dizziness, DOE, Fatigue, Light-Headedness and SOB   Nuclear Pre-Procedure Caffeine/Decaff Intake:  12:00am NPO After: 10am   IV Site: R Forearm  IV 0.9% NS with Angio Cath:  22g  Chest Size (in):  n/a IV Started by: Rolene Course, RN  Height: 5\' 6"  (1.676 m)  Cup Size: D  BMI:  Body mass index is 31.33 kg/(m^2). Weight:  194 lb (87.998 kg)   Tech Comments:  n/a    Nuclear Med Study 1 or 2 day study: 1 day  Stress Test Type:  [place]  Order Authorizing Provider:  Quay Burow, MD   Resting Radionuclide: Technetium 33m Sestamibi  Resting Radionuclide Dose: 10.5 mCi   Stress Radionuclide:  Technetium 50m Sestamibi  Stress Radionuclide Dose: 30.4 mCi           Stress Protocol Rest HR: 65 Stress HR: 72  Rest BP: 190/87 Stress BP: 165/74  Exercise Time (min): n/a METS: n/a   Predicted Max HR: 135 bpm % Max HR: 60.74 bpm Rate Pressure Product: 15580  Dose of Adenosine (mg):  n/a Dose of Lexiscan: 0.4 mg  Dose of Atropine (mg): n/a Dose of Dobutamine: n/a mcg/kg/min (at max HR)  Stress Test Technologist: Leane Para, CCT Nuclear Technologist: Imagene Riches, CNMT   Rest Procedure:  Myocardial perfusion imaging was performed at rest 45 minutes following the intravenous administration of Technetium 32m Sestamibi. Stress Procedure:  The patient received IV Lexiscan 0.4 mg over 15-seconds.  Technetium 42m Sestamibi injected IV at  30-seconds. Patient experienced SOB and 75 mg of Aminophylline IV was administered. There were no significant changes with Lexiscan.  Quantitative spect images were obtained after a 45 minute delay.  Transient Ischemic Dilatation (Normal <1.22):  1.18  QGS EDV:  85 ml QGS ESV:  32 ml LV Ejection Fraction: 62%    Rest ECG: NSR; Q wave in avL  Stress ECG: No significant change from baseline ECG  QPS Raw Data Images:  Normal; no motion artifact; normal heart/lung ratio. Stress Images:  Normal homogeneous uptake in all areas of the myocardium. Rest Images:  Normal homogeneous uptake in all areas of the myocardium. Subtraction (SDS):  Normal  Impression Exercise Capacity:  Lexiscan with no exercise. BP Response:  Normal blood pressure response. Clinical Symptoms:  No significant symptoms noted. ECG Impression:  No significant ST segment change suggestive of ischemia. Comparison with Prior Nuclear Study: No previous nuclear study performed  Overall Impression:  Normal stress nuclear study.  LV Wall Motion:  NL LV Function, EF 62%; NL Wall Motion   Allison Duffy A, MD  07/21/2013 7:48 AM

## 2013-07-28 NOTE — Telephone Encounter (Signed)
Encounter complete. 

## 2013-07-29 ENCOUNTER — Ambulatory Visit (INDEPENDENT_AMBULATORY_CARE_PROVIDER_SITE_OTHER): Payer: Medicare Other | Admitting: Family Medicine

## 2013-07-29 ENCOUNTER — Emergency Department (HOSPITAL_COMMUNITY): Payer: Medicare Other

## 2013-07-29 ENCOUNTER — Inpatient Hospital Stay (HOSPITAL_COMMUNITY)
Admission: EM | Admit: 2013-07-29 | Discharge: 2013-08-02 | DRG: 501 | Disposition: A | Discharge status: Skilled Nursing Facility | Payer: Medicare Other | Attending: Internal Medicine | Admitting: Internal Medicine

## 2013-07-29 ENCOUNTER — Encounter (HOSPITAL_COMMUNITY): Payer: Self-pay | Admitting: Emergency Medicine

## 2013-07-29 VITALS — BP 160/80 | HR 71 | Temp 97.9°F | Resp 16 | Ht 65.5 in | Wt 194.0 lb

## 2013-07-29 DIAGNOSIS — IMO0002 Reserved for concepts with insufficient information to code with codable children: Secondary | ICD-10-CM | POA: Diagnosis present

## 2013-07-29 DIAGNOSIS — Z87891 Personal history of nicotine dependence: Secondary | ICD-10-CM | POA: Diagnosis not present

## 2013-07-29 DIAGNOSIS — I10 Essential (primary) hypertension: Secondary | ICD-10-CM | POA: Diagnosis not present

## 2013-07-29 DIAGNOSIS — M71122 Other infective bursitis, left elbow: Secondary | ICD-10-CM

## 2013-07-29 DIAGNOSIS — Z79899 Other long term (current) drug therapy: Secondary | ICD-10-CM | POA: Diagnosis not present

## 2013-07-29 DIAGNOSIS — M25521 Pain in right elbow: Secondary | ICD-10-CM

## 2013-07-29 DIAGNOSIS — L03119 Cellulitis of unspecified part of limb: Secondary | ICD-10-CM

## 2013-07-29 DIAGNOSIS — M7020 Olecranon bursitis, unspecified elbow: Principal | ICD-10-CM | POA: Diagnosis present

## 2013-07-29 DIAGNOSIS — R5381 Other malaise: Secondary | ICD-10-CM | POA: Diagnosis present

## 2013-07-29 DIAGNOSIS — M25529 Pain in unspecified elbow: Secondary | ICD-10-CM

## 2013-07-29 DIAGNOSIS — M71129 Other infective bursitis, unspecified elbow: Secondary | ICD-10-CM | POA: Diagnosis present

## 2013-07-29 DIAGNOSIS — S51009A Unspecified open wound of unspecified elbow, initial encounter: Secondary | ICD-10-CM | POA: Diagnosis not present

## 2013-07-29 DIAGNOSIS — Z7982 Long term (current) use of aspirin: Secondary | ICD-10-CM | POA: Diagnosis not present

## 2013-07-29 DIAGNOSIS — M6281 Muscle weakness (generalized): Secondary | ICD-10-CM | POA: Diagnosis not present

## 2013-07-29 DIAGNOSIS — E785 Hyperlipidemia, unspecified: Secondary | ICD-10-CM | POA: Diagnosis present

## 2013-07-29 DIAGNOSIS — E119 Type 2 diabetes mellitus without complications: Secondary | ICD-10-CM | POA: Diagnosis not present

## 2013-07-29 DIAGNOSIS — E1129 Type 2 diabetes mellitus with other diabetic kidney complication: Secondary | ICD-10-CM | POA: Diagnosis not present

## 2013-07-29 DIAGNOSIS — I2510 Atherosclerotic heart disease of native coronary artery without angina pectoris: Secondary | ICD-10-CM | POA: Diagnosis not present

## 2013-07-29 DIAGNOSIS — R262 Difficulty in walking, not elsewhere classified: Secondary | ICD-10-CM | POA: Diagnosis not present

## 2013-07-29 HISTORY — DX: Nausea with vomiting, unspecified: Z98.890

## 2013-07-29 HISTORY — DX: Nausea with vomiting, unspecified: R11.2

## 2013-07-29 HISTORY — DX: Gout, unspecified: M10.9

## 2013-07-29 HISTORY — PX: OLECRANON BURSECTOMY: SHX2097

## 2013-07-29 HISTORY — DX: Type 2 diabetes mellitus without complications: E11.9

## 2013-07-29 HISTORY — DX: Unspecified osteoarthritis, unspecified site: M19.90

## 2013-07-29 LAB — BASIC METABOLIC PANEL
ANION GAP: 15 (ref 5–15)
BUN: 20 mg/dL (ref 6–23)
CHLORIDE: 99 meq/L (ref 96–112)
CO2: 25 mEq/L (ref 19–32)
Calcium: 10 mg/dL (ref 8.4–10.5)
Creatinine, Ser: 0.81 mg/dL (ref 0.50–1.10)
GFR calc non Af Amer: 64 mL/min — ABNORMAL LOW (ref >90)
GFR, EST AFRICAN AMERICAN: 75 mL/min — AB (ref >90)
Glucose, Bld: 216 mg/dL — ABNORMAL HIGH (ref 70–99)
Potassium: 3.8 mEq/L (ref 3.7–5.3)
Sodium: 139 mEq/L (ref 137–147)

## 2013-07-29 LAB — GLUCOSE, CAPILLARY: Glucose-Capillary: 272 mg/dL — ABNORMAL HIGH (ref 70–99)

## 2013-07-29 LAB — CBC
HCT: 41.2 % (ref 36.0–46.0)
HEMOGLOBIN: 15 g/dL (ref 12.0–15.0)
MCH: 33.6 pg (ref 26.0–34.0)
MCHC: 36.4 g/dL — ABNORMAL HIGH (ref 30.0–36.0)
MCV: 92.2 fL (ref 78.0–100.0)
Platelets: 217 10*3/uL (ref 150–400)
RBC: 4.47 MIL/uL (ref 3.87–5.11)
RDW: 11.9 % (ref 11.5–15.5)
WBC: 8.6 10*3/uL (ref 4.0–10.5)

## 2013-07-29 LAB — GRAM STAIN

## 2013-07-29 LAB — PHOSPHORUS: Phosphorus: 3.6 mg/dL (ref 2.3–4.6)

## 2013-07-29 LAB — CBG MONITORING, ED: GLUCOSE-CAPILLARY: 186 mg/dL — AB (ref 70–99)

## 2013-07-29 LAB — MAGNESIUM: Magnesium: 1.9 mg/dL (ref 1.5–2.5)

## 2013-07-29 MED ORDER — ASPIRIN EC 81 MG PO TBEC
81.0000 mg | DELAYED_RELEASE_TABLET | Freq: Every day | ORAL | Status: DC
  Administered 2013-07-29 – 2013-08-02 (×5): 81 mg via ORAL
  Filled 2013-07-29 (×6): qty 1

## 2013-07-29 MED ORDER — ENOXAPARIN SODIUM 40 MG/0.4ML ~~LOC~~ SOLN
40.0000 mg | SUBCUTANEOUS | Status: DC
  Administered 2013-07-29 – 2013-08-01 (×4): 40 mg via SUBCUTANEOUS
  Filled 2013-07-29 (×5): qty 0.4

## 2013-07-29 MED ORDER — VANCOMYCIN HCL IN DEXTROSE 750-5 MG/150ML-% IV SOLN
750.0000 mg | Freq: Two times a day (BID) | INTRAVENOUS | Status: DC
  Administered 2013-07-29 – 2013-08-02 (×8): 750 mg via INTRAVENOUS
  Filled 2013-07-29 (×9): qty 150

## 2013-07-29 MED ORDER — ISOSORBIDE MONONITRATE 15 MG HALF TABLET
15.0000 mg | ORAL_TABLET | Freq: Every day | ORAL | Status: DC
  Administered 2013-07-29 – 2013-08-02 (×5): 15 mg via ORAL
  Filled 2013-07-29 (×5): qty 1

## 2013-07-29 MED ORDER — ACETAMINOPHEN 325 MG PO TABS
650.0000 mg | ORAL_TABLET | Freq: Four times a day (QID) | ORAL | Status: DC | PRN
  Administered 2013-07-29 – 2013-08-01 (×4): 650 mg via ORAL
  Filled 2013-07-29 (×4): qty 2

## 2013-07-29 MED ORDER — AMLODIPINE BESYLATE 10 MG PO TABS
10.0000 mg | ORAL_TABLET | Freq: Every day | ORAL | Status: DC
  Administered 2013-07-29 – 2013-08-02 (×5): 10 mg via ORAL
  Filled 2013-07-29 (×5): qty 1

## 2013-07-29 MED ORDER — DEXTROSE 5 % IV SOLN
1.0000 g | INTRAVENOUS | Status: DC
Start: 2013-07-29 — End: 2013-07-31
  Administered 2013-07-29 – 2013-07-30 (×2): 1 g via INTRAVENOUS
  Filled 2013-07-29 (×3): qty 10

## 2013-07-29 MED ORDER — ASPIRIN 81 MG PO CHEW: 81.0000 mg | CHEWABLE_TABLET | Freq: Every day | ORAL | Status: DC

## 2013-07-29 MED ORDER — PROPYLENE GLYCOL 0.6 % OP SOLN: 1.0000 [drp] | Freq: Two times a day (BID) | OPHTHALMIC | Status: DC

## 2013-07-29 MED ORDER — ONDANSETRON HCL 4 MG PO TABS
4.0000 mg | ORAL_TABLET | Freq: Four times a day (QID) | ORAL | Status: DC | PRN
  Administered 2013-07-31 – 2013-08-02 (×2): 4 mg via ORAL
  Filled 2013-07-29 (×2): qty 1

## 2013-07-29 MED ORDER — SIMVASTATIN 10 MG PO TABS
10.0000 mg | ORAL_TABLET | Freq: Every day | ORAL | Status: DC
  Administered 2013-07-31 – 2013-08-01 (×2): 10 mg via ORAL
  Filled 2013-07-29 (×4): qty 1

## 2013-07-29 MED ORDER — ASPIRIN EC 81 MG PO TBEC: 81.0000 mg | DELAYED_RELEASE_TABLET | Freq: Every day | ORAL | Status: DC

## 2013-07-29 MED ORDER — ALPRAZOLAM 0.5 MG PO TABS
0.5000 mg | ORAL_TABLET | Freq: Four times a day (QID) | ORAL | Status: DC | PRN
  Administered 2013-07-29 – 2013-07-31 (×2): 0.5 mg via ORAL
  Filled 2013-07-29 (×2): qty 1

## 2013-07-29 MED ORDER — ONDANSETRON HCL 4 MG/2ML IJ SOLN
4.0000 mg | Freq: Four times a day (QID) | INTRAMUSCULAR | Status: DC | PRN
  Administered 2013-07-30 – 2013-07-31 (×2): 4 mg via INTRAVENOUS
  Filled 2013-07-29 (×2): qty 2

## 2013-07-29 MED ORDER — METOPROLOL TARTRATE 25 MG PO TABS
25.0000 mg | ORAL_TABLET | Freq: Two times a day (BID) | ORAL | Status: DC
  Administered 2013-07-29 – 2013-08-02 (×7): 25 mg via ORAL
  Filled 2013-07-29 (×10): qty 1

## 2013-07-29 MED ORDER — POLYVINYL ALCOHOL 1.4 % OP SOLN
1.0000 [drp] | Freq: Two times a day (BID) | OPHTHALMIC | Status: DC
  Administered 2013-07-29 – 2013-08-01 (×6): 1 [drp] via OPHTHALMIC
  Filled 2013-07-29 (×2): qty 15

## 2013-07-29 MED ORDER — CLINDAMYCIN PHOSPHATE 900 MG/50ML IV SOLN
900.0000 mg | Freq: Once | INTRAVENOUS | Status: AC
  Administered 2013-07-29: 900 mg via INTRAVENOUS
  Filled 2013-07-29: qty 50

## 2013-07-29 MED ORDER — INSULIN ASPART 100 UNIT/ML ~~LOC~~ SOLN
0.0000 [IU] | Freq: Three times a day (TID) | SUBCUTANEOUS | Status: DC
  Administered 2013-07-30: 2 [IU] via SUBCUTANEOUS
  Administered 2013-07-30: 3 [IU] via SUBCUTANEOUS
  Administered 2013-07-30 – 2013-07-31 (×3): 2 [IU] via SUBCUTANEOUS
  Administered 2013-07-31: 3 [IU] via SUBCUTANEOUS
  Administered 2013-08-01 (×2): 2 [IU] via SUBCUTANEOUS
  Administered 2013-08-01 – 2013-08-02 (×3): 3 [IU] via SUBCUTANEOUS

## 2013-07-29 MED ORDER — METHOCARBAMOL 500 MG PO TABS
500.0000 mg | ORAL_TABLET | Freq: Four times a day (QID) | ORAL | Status: DC | PRN
  Administered 2013-07-29 – 2013-07-30 (×2): 500 mg via ORAL
  Filled 2013-07-29 (×2): qty 1

## 2013-07-29 MED ORDER — LISINOPRIL 40 MG PO TABS
40.0000 mg | ORAL_TABLET | Freq: Every day | ORAL | Status: DC
  Administered 2013-07-29 – 2013-08-02 (×5): 40 mg via ORAL
  Filled 2013-07-29 (×5): qty 1

## 2013-07-29 MED ORDER — LINAGLIPTIN 5 MG PO TABS
5.0000 mg | ORAL_TABLET | Freq: Every day | ORAL | Status: DC
  Administered 2013-07-29 – 2013-08-02 (×5): 5 mg via ORAL
  Filled 2013-07-29 (×5): qty 1

## 2013-07-29 MED ORDER — INSULIN ASPART 100 UNIT/ML ~~LOC~~ SOLN
0.0000 [IU] | Freq: Every day | SUBCUTANEOUS | Status: DC
  Administered 2013-07-29 – 2013-07-31 (×2): 3 [IU] via SUBCUTANEOUS
  Administered 2013-08-01: 2 [IU] via SUBCUTANEOUS

## 2013-07-29 MED ORDER — HYDROCODONE-ACETAMINOPHEN 5-325 MG PO TABS
2.0000 | ORAL_TABLET | ORAL | Status: DC | PRN
  Administered 2013-07-29 – 2013-07-30 (×5): 2 via ORAL
  Filled 2013-07-29 (×5): qty 2

## 2013-07-29 MED ORDER — METFORMIN HCL 500 MG PO TABS
500.0000 mg | ORAL_TABLET | Freq: Every day | ORAL | Status: DC
  Administered 2013-07-30 – 2013-08-02 (×4): 500 mg via ORAL
  Filled 2013-07-29 (×6): qty 1

## 2013-07-29 MED ORDER — SODIUM CHLORIDE 0.9 % IJ SOLN
3.0000 mL | Freq: Two times a day (BID) | INTRAMUSCULAR | Status: DC
  Administered 2013-07-30 – 2013-08-01 (×3): 3 mL via INTRAVENOUS

## 2013-07-29 NOTE — ED Notes (Signed)
Pt, sent by [facility], c/o L elbow infection.

## 2013-07-29 NOTE — ED Provider Notes (Signed)
Medical screening examination/treatment/procedure(s) were conducted as a shared visit with non-physician practitioner(s) and myself.  I personally evaluated the patient during the encounter.  Atraumatic L elbow redness and pain.  FROM elbow joint, does not appear septic.  Redness and edema consistent with septic bursitis and surrounding erythema, edema, and induration.   EKG Interpretation None       Ezequiel Essex, MD 07/29/13 1919

## 2013-07-29 NOTE — ED Notes (Signed)
Per pt, states left elbow started itching and went to [facility] today and they said sore was infected and needed IV antibiotics

## 2013-07-29 NOTE — Consult Note (Signed)
Reason for Consult: Infection left elbow Referring Physician: Zoraida Havrilla is an 80 y.o. female.  HPI: A 55-year-old female with a 3 day history of left elbow infection.  She is left elbow with ascending lymphangitis/cellulitis. She has an area of skin breakdown which is early in nature over the olecranon bursa.  She's been seen and evaluated by emergency room staff as well as Dr. Joylene Draft She notes no other complaints.  She is a diabetic  She denies neck back chest or lower extremity pain. She denies right arm pain. I have reviewed the chart at length. Past Medical History  Diagnosis Date  . Hypertension   . Diabetes mellitus without complication   . Hyperlipidemia   . Chest pain     History reviewed. No pertinent past surgical history.  Family History  Problem Relation Age of Onset  . Heart disease Mother   . Diabetes Mother   . Hypertension Mother   . Hypertension Father   . Heart disease Father   . Heart disease Brother     both brothers  . Diabetes Brother   . Hypertension Brother     Social History:  reports that she quit smoking about 60 years ago. She does not have any smokeless tobacco history on file. She reports that she does not drink alcohol. Her drug history is not on file.  Allergies:  Allergies  Allergen Reactions  . Codeine Nausea And Vomiting  . Sulfa Antibiotics Itching and Nausea Only    Medications: I have reviewed the patient's current medications.  Results for orders placed during the hospital encounter of 07/29/13 (from the past 48 hour(s))  CBG MONITORING, ED     Status: Abnormal   Collection Time    07/29/13  1:11 PM      Result Value Ref Range   Glucose-Capillary 186 (*) 70 - 99 mg/dL  BASIC METABOLIC PANEL     Status: Abnormal   Collection Time    07/29/13  1:31 PM      Result Value Ref Range   Sodium 139  137 - 147 mEq/L   Potassium 3.8  3.7 - 5.3 mEq/L   Chloride 99  96 - 112 mEq/L   CO2 25  19 - 32 mEq/L   Glucose,  Bld 216 (*) 70 - 99 mg/dL   BUN 20  6 - 23 mg/dL   Creatinine, Ser 0.81  0.50 - 1.10 mg/dL   Calcium 10.0  8.4 - 10.5 mg/dL   GFR calc non Af Amer 64 (*) >90 mL/min   GFR calc Af Amer 75 (*) >90 mL/min   Comment: (NOTE)     The eGFR has been calculated using the CKD EPI equation.     This calculation has not been validated in all clinical situations.     eGFR's persistently <90 mL/min signify possible Chronic Kidney     Disease.   Anion gap 15  5 - 15  CBC     Status: Abnormal   Collection Time    07/29/13  1:31 PM      Result Value Ref Range   WBC 8.6  4.0 - 10.5 K/uL   RBC 4.47  3.87 - 5.11 MIL/uL   Hemoglobin 15.0  12.0 - 15.0 g/dL   HCT 41.2  36.0 - 46.0 %   MCV 92.2  78.0 - 100.0 fL   MCH 33.6  26.0 - 34.0 pg   MCHC 36.4 (*) 30.0 - 36.0 g/dL  RDW 11.9  11.5 - 15.5 %   Platelets 217  150 - 400 K/uL  MAGNESIUM     Status: None   Collection Time    07/29/13  1:31 PM      Result Value Ref Range   Magnesium 1.9  1.5 - 2.5 mg/dL  PHOSPHORUS     Status: None   Collection Time    07/29/13  1:31 PM      Result Value Ref Range   Phosphorus 3.6  2.3 - 4.6 mg/dL    Dg Elbow Complete Left  07/29/2013   CLINICAL DATA:  Localized wound infection  EXAM: LEFT ELBOW - COMPLETE 3+ VIEW  COMPARISON:  None.  FINDINGS: Generalized soft tissue swelling is noted. The underlying bony structures are within normal limits. No acute fracture is seen.  IMPRESSION: No acute bony abnormality is noted.  Generalized soft tissue changes are noted with focal swelling over the olecranon consistent with the given clinical history.   Electronically Signed   By: Inez Catalina M.D.   On: 07/29/2013 14:33    Review of Systems  HENT: Negative.   Eyes: Negative.   Cardiovascular: Negative.   Gastrointestinal: Negative.   Neurological: Negative.   Endo/Heme/Allergies: Negative.   Psychiatric/Behavioral: Negative.    Blood pressure 123/88, pulse 70, temperature 98.4 F (36.9 C), temperature source Oral,  resp. rate 20, height 5' 5.5" (1.664 m), weight 87.998 kg (194 lb), SpO2 98.00%. Physical Exam left elbow has a painful area over the olecranon. She has thin skin and early de-epithelialized skin tissue with breakdown. She is appears to be a small area of fluctuance over the posterior elbow. I've reviewed this. Chest the ascending cellulitis/lymphangitis. She is intact her vascular exam. Her pulses normal radial median and ulnar nerve function is intact.  She notes no locking popping catching or mechanical symptoms in the elbow. There is no evidence of a septic joint her x-rays are reviewed.  Wrist and shoulder are stable.  The patient is alert and oriented in no acute distress the patient complains of pain in the affected upper extremity.  The patient is noted to have a normal HEENT exam.  Lung fields show equal chest expansion and no shortness of breath  abdomen exam is nontender without distention.  Lower extremity examination does not show any fracture dislocation or blood clot symptoms.  Pelvis is stable neck and back are stable and nontender    Assessment/Plan: Infected left elbow/olecranon bursa with ascending cellulitis  We are planning surgery for your upper extremity. The risk and benefits of surgery include risk of bleeding infection anesthesia damage to normal structures and failure of the surgery to accomplish its intended goals of relieving symptoms and restoring function with this in mind we'll going to proceed. I have specifically discussed with the patient the pre-and postoperative regime and the does and don'ts and risk and benefits in great detail. Risk and benefits of surgery also include risk of dystrophy chronic nerve pain failure of the healing process to go onto completion and other inherent risks of surgery The relavent the pathophysiology of the disease/injury process, as well as the alternatives for treatment and postoperative course of action has been discussed in  great detail with the patient who desires to proceed.  We will do everything in our power to help you (the patient) restore function to the upper extremity. Is a pleasure to see this patient today.   Procedure note:  patient was consented following this she underwent a  block anesthetic with lidocaine with epinephrine. I was very careful to pre-scrub the area allowed Betadine to dry then anesthetized region.  Following this or if it was secured.  Following this final timeout was called and I made 2 incisions one 1/2 cm above and below the bursal pad. Following this with very careful dissection I performed decompression of the abscess and  cultures were taken.  Following this we performed a very careful olecranon bursectomy.  The olecranon bursa was removed via a window technique. This was done to my satisfaction.  Following bursectomy we then irrigated with a liter of saline followed by placement of a Penrose drain followed by placement of Adaptic Xeroform gauze Kerlix and a compressive bandage.  She tolerated she is well no complicating features   She'll be admitted for IV antibiotics general wound care. We'll see her daily and perform daily lavages until the processes stable. She understands this and the plans. She's delightful female in our quite well from prior experience in my office  Paulene Floor 07/29/2013, 8:26 PM

## 2013-07-29 NOTE — ED Notes (Signed)
Admitting doctor would like for Dr. Jonna Munro to see the pt in the ED before going to floor.

## 2013-07-29 NOTE — Progress Notes (Signed)
[facility] [address] [place] [address] [phone]  Date:  07/29/2013   Name:  Allison Duffy   DOB:  [birth year]   MRN:  [id number]  PCP:  Geoffery Lyons, MD    Chief Complaint: Insect Bite   History of Present Illness:  Allison Duffy is a 80 y.o. very pleasant female patient who presents with the following:  She is here today with a possible infection of her left elbow.  She noted an itchy spot on her elbow 2 days ago.  Yesterday it started to hurt and swell, and now is more painful  She has not noted a definite fever but has felt tired and slightly ill overall.  She is not sure if she might have had an injury to her elbow.    Patient Active Problem List   Diagnosis Date Noted  . Essential hypertension 07/05/2013  . Hyperlipidemia 07/05/2013  . Type 2 diabetes mellitus 07/05/2013  . Chest pain 07/05/2013    Past Medical History  Diagnosis Date  . Hypertension   . Diabetes mellitus without complication   . Hyperlipidemia   . Chest pain     History reviewed. No pertinent past surgical history.  History  Substance Use Topics  . Smoking status: Former Smoker    Quit date: 07/05/1953  . Smokeless tobacco: Not on file  . Alcohol Use: No    Family History  Problem Relation Age of Onset  . Heart disease Mother   . Diabetes Mother   . Hypertension Mother   . Hypertension Father   . Heart disease Father   . Heart disease Brother     both brothers  . Diabetes Brother   . Hypertension Brother     Allergies  Allergen Reactions  . Codeine Nausea Only  . Sulfa Antibiotics Itching and Nausea Only    Medication list has been reviewed and updated.  Current Outpatient Prescriptions on File Prior to Visit  Medication Sig Dispense Refill  . acetaminophen (TYLENOL) 325 MG tablet Take 650 mg by mouth every 6 (six) hours as needed for pain.      Marland Kitchen amLODipine (NORVASC) 10 MG tablet Take 10 mg by mouth daily.      Marland Kitchen aspirin 81 MG  chewable tablet Chew 81 mg by mouth daily.      . Calcium-Magnesium-Vitamin D (CALCIUM 500 PO) Take 1 tablet by mouth 2 (two) times daily. Vitality Calcium      . glipiZIDE (GLUCOTROL XL) 10 MG 24 hr tablet Take 10 mg by mouth 2 (two) times daily.       . Glucosamine-Chondroitin (GLUCOSAMINE CHONDR COMPLEX PO) Take 2,000 mg by mouth daily.      . isosorbide mononitrate (IMDUR) 30 MG 24 hr tablet Take 0.5 tablets (15 mg total) by mouth daily.  15 tablet  6  . lisinopril (PRINIVIL,ZESTRIL) 40 MG tablet Take 40 mg by mouth daily.      . metFORMIN (GLUCOPHAGE) 500 MG tablet Take 500 mg by mouth daily with breakfast.       . metoprolol tartrate (LOPRESSOR) 25 MG tablet Take 25 mg by mouth 2 (two) times daily.      . Multiple Vitamin (MULTIVITAMIN WITH MINERALS) TABS Take 1 tablet by mouth 2 (two) times daily. Vitality Multivitamin & mineral      . pravastatin (PRAVACHOL) 20 MG tablet Take 20 mg by mouth daily.      Marland Kitchen Propylene Glycol (SYSTANE BALANCE OP) Apply 1  drop to eye daily as needed. For dry eyes as needed      . sitaGLIPtin (JANUVIA) 100 MG tablet Take 100 mg by mouth at bedtime.       No current facility-administered medications on file prior to visit.    Review of Systems:  As per HPI- otherwise negative.   Physical Examination: Filed Vitals:   07/29/13 1131  BP: 160/80  Pulse: 71  Temp: 97.9 F (36.6 C)  Resp: 16   Filed Vitals:   07/29/13 1131  Height: 5' 5.5" (1.664 m)  Weight: 194 lb (87.998 kg)   Body mass index is 31.78 kg/(m^2). Ideal Body Weight: Weight in (lb) to have BMI = 25: 152.2  GEN: WDWN, NAD, Non-toxic, A & O x 3, overweight, looks well HEENT: Atraumatic, Normocephalic. Neck supple. No masses, No LAD. Ears and Nose: No external deformity. CV: RRR, No M/G/R. No JVD. No thrill. No extra heart sounds. PULM: CTA B, no wheezes, crackles, rhonchi. No retractions. No resp. distress. No accessory muscle use. EXTR: No c/c/e NEURO Normal gait.  PSYCH: Normally  interactive. Conversant. Not depressed or anxious appearing.  Calm demeanor.  Left elbow: she has an area of draining cellulitis over the left point of the elbow.  She has normal ROM with movement of the elbow joint but she does have pain with ROM.  Significant swelling, warmth, and tenderness over the posterior arm/ elbow.    Assessment and Plan: Cellulitis of elbow  Pain, elbow, right  Allison Duffy is here today with a 2 day history of left elbow pain and cellulitis.  I am concerned about the severity of her infection after such a short period and possible involvement of the joint.  Will have her proceed to the ER for further treatment- may need IV abx or admission. We are not able to do a CBC here today because our machine is out of service.  Appreciate care by ED staff.    Signed Lamar Blinks, MD

## 2013-07-29 NOTE — Progress Notes (Signed)
ANTIBIOTIC CONSULT NOTE - INITIAL  Pharmacy Consult for Vancomycin Indication: Septic olecranon bursitis  Allergies  Allergen Reactions  . Codeine Nausea And Vomiting  . Sulfa Antibiotics Itching and Nausea Only    Patient Measurements: Wt=88 kg  Vital Signs: Temp: 98.4 F (36.9 C) (07/03 1257) Temp src: Oral (07/03 1257) BP: 123/88 mmHg (07/03 1628) Pulse Rate: 70 (07/03 1628) Intake/Output from previous day:   Intake/Output from this shift:    Labs:  Recent Labs  07/29/13 1331  WBC 8.6  HGB 15.0  PLT 217  CREATININE 0.81   The CrCl is unknown because both a height and weight (above a minimum accepted value) are required for this calculation. No results found for this basename: VANCOTROUGH, VANCOPEAK, VANCORANDOM, GENTTROUGH, GENTPEAK, GENTRANDOM, TOBRATROUGH, TOBRAPEAK, TOBRARND, AMIKACINPEAK, AMIKACINTROU, AMIKACIN,  in the last 72 hours   Microbiology: No results found for this or any previous visit (from the past 720 hour(s)).  Medical History: Past Medical History  Diagnosis Date  . Hypertension   . Diabetes mellitus without complication   . Hyperlipidemia   . Chest pain     Medications:  Scheduled:  . amLODipine  10 mg Oral Daily  . aspirin  81 mg Oral Daily  . aspirin EC  81 mg Oral Daily  . enoxaparin (LOVENOX) injection  40 mg Subcutaneous Q24H  . insulin aspart  0-5 Units Subcutaneous QHS  . [START ON 07/30/2013] insulin aspart  0-9 Units Subcutaneous TID WC  . isosorbide mononitrate  15 mg Oral Daily  . linagliptin  5 mg Oral Daily  . lisinopril  40 mg Oral Daily  . [START ON 07/30/2013] metFORMIN  500 mg Oral Q breakfast  . metoprolol tartrate  25 mg Oral BID  . Propylene Glycol  1 drop Both Eyes BID  . [START ON 07/30/2013] simvastatin  10 mg Oral q1800  . sodium chloride  3 mL Intravenous Q12H  . vancomycin  750 mg Intravenous Q12H   Infusions:  . cefTRIAXone (ROCEPHIN)  IV     Assessment: 7 yoF with hx of HTN, DM, hyperlipidemia and  chest pain c/o left elbow itching and pain. Vancomycin per Rx/Rocephin per MD for probable septic olecranon bursitis.   Goal of Therapy:  Vancomycin trough level 15-20 mcg/ml  Plan:   Vancomycin 750mg  IV q12h  F/U SCr/levels/cultures as needed  Dorrene German 07/29/2013,6:24 PM

## 2013-07-29 NOTE — Patient Instructions (Signed)
Please go to the [facility] for further evaluation and treatment.

## 2013-07-29 NOTE — ED Notes (Signed)
Pt Daughter Glenice Bow contact # [phone] home [phone].

## 2013-07-29 NOTE — H&P (Signed)
Allison Duffy is an 80 y.o. female.   Chief Complaint: painful left elbow. HPI: Allison Duffy is a pleasant 80 yo female with pmhx as below.  2 days ago she noticed the left elbow was itching and painful.  She put some anti-itch cream on it.  Yesterday she put neosporin on it.  But, it kept getting worse. Last night it was very painful.  She feels nauseated.   No fever but maybe some subjective temp last night.  She has cp and sob a lot but she says this is no different than usual.  She has seen cards Dr. Gwenlyn Found for this in the past.   The elbow became more swollen yesterday and even more swollen today.  Past Medical History  Diagnosis Date  . Hypertension   . Diabetes mellitus without complication   . Hyperlipidemia   . Chest pain     History reviewed. S/p gb out, s/p uterus out,  S/p parathyroidectomy one out,  S/p surgery on both shoulders.  Multiple hand surgeries in the past.   She had arthroscopy to her right knee.  Family History  Problem Relation Age of Onset  . Heart disease Mother   . Diabetes Mother   . Hypertension Mother   . Hypertension Father   . Heart disease Father   . Heart disease Brother     both brothers  . Diabetes Brother   . Hypertension Brother    Social History:  reports that she quit smoking about 60 years ago. She does not have any smokeless tobacco history on file. She reports that she does not drink alcohol. Her drug history is not on file.  Widow 26 years ago,  Molli Knock.  Children 4,  Gc  8, GGC  3 and 2/3.  Lives by herself.  Lynnell Dike daughter.  Allergies:  Allergies  Allergen Reactions  . Codeine Nausea And Vomiting  . Sulfa Antibiotics Itching and Nausea Only    Home meds:   tylenol  325, 650 mg q6 hrs prn Amlodipine 10 mg one daily Asa 81 mg daily Calcium with d bid Glucotrol xL  10  Mg po bid Glucosamine Isosorbide  30  Mg daily Lisinopril  40 mg daily Metformin 500 mg once daily in a.m. Metoprolol 25 mg  bid mvi Pravastatin 20 mg daily systane eye gtt januvia 100 mg po daily at bedtime.   Results for orders placed during the hospital encounter of 07/29/13 (from the past 48 hour(s))  CBG MONITORING, ED     Status: Abnormal   Collection Time    07/29/13  1:11 PM      Result Value Ref Range   Glucose-Capillary 186 (*) 70 - 99 mg/dL  BASIC METABOLIC PANEL     Status: Abnormal   Collection Time    07/29/13  1:31 PM      Result Value Ref Range   Sodium 139  137 - 147 mEq/L   Potassium 3.8  3.7 - 5.3 mEq/L   Chloride 99  96 - 112 mEq/L   CO2 25  19 - 32 mEq/L   Glucose, Bld 216 (*) 70 - 99 mg/dL   BUN 20  6 - 23 mg/dL   Creatinine, Ser 0.81  0.50 - 1.10 mg/dL   Calcium 10.0  8.4 - 10.5 mg/dL   GFR calc non Af Amer 64 (*) >90 mL/min   GFR calc Af Amer 75 (*) >90 mL/min   Comment: (NOTE)     The eGFR  has been calculated using the CKD EPI equation.     This calculation has not been validated in all clinical situations.     eGFR's persistently <90 mL/min signify possible Chronic Kidney     Disease.   Anion gap 15  5 - 15  CBC     Status: Abnormal   Collection Time    07/29/13  1:31 PM      Result Value Ref Range   WBC 8.6  4.0 - 10.5 K/uL   RBC 4.47  3.87 - 5.11 MIL/uL   Hemoglobin 15.0  12.0 - 15.0 g/dL   HCT 41.2  36.0 - 46.0 %   MCV 92.2  78.0 - 100.0 fL   MCH 33.6  26.0 - 34.0 pg   MCHC 36.4 (*) 30.0 - 36.0 g/dL   RDW 11.9  11.5 - 15.5 %   Platelets 217  150 - 400 K/uL   Dg Elbow Complete Left  07/29/2013   CLINICAL DATA:  Localized wound infection  EXAM: LEFT ELBOW - COMPLETE 3+ VIEW  COMPARISON:  None.  FINDINGS: Generalized soft tissue swelling is noted. The underlying bony structures are within normal limits. No acute fracture is seen.  IMPRESSION: No acute bony abnormality is noted.  Generalized soft tissue changes are noted with focal swelling over the olecranon consistent with the given clinical history.   Electronically Signed   By: Inez Catalina M.D.   On: 07/29/2013  14:33    ROS:as per hpi   Blood pressure 123/88, pulse 70, temperature 98.4 F (36.9 C), temperature source Oral, resp. rate 20, SpO2 98.00%.  age appropriate female. semi-supine in bed.  alert and oriented times 4.  she has no jvd. lungs are cta bilat. no w/r/r.   heart is rrr no m/r/g.  abd is soft, nt, nd, no mass , no hsm.   LE normal, no c/c/e.  moe x4 and she is grossly neurologically intact. the left elbow is swollen , warm , there is redness and blistering over the olecranon bursa.  Assessment/Plan 80 yo female with dm2 presenting with what appears to be septic olecranon bursitis.  We will admit.  We will give vanco and rocephin iv.  We will await UE ortho consult.   Continue home meds.  Full code status.  Jerlyn Ly, MD 07/29/2013, 5:38 PM

## 2013-07-29 NOTE — ED Provider Notes (Signed)
CSN: [id number]     Arrival date & time 07/29/13  1245 History   First MD Initiated Contact with Patient 07/29/13 1304     Chief Complaint  Patient presents with  . Wound Infection     (Consider location/radiation/quality/duration/timing/severity/associated sxs/prior Treatment) HPI Patient presents to the emergency department with left elbow infection from urgent care the patient has a swollen area to her left posterior elbow with redness surrounding the area.  The patient, states she did not have any trauma or wounds to the area.  The patient, states, that nothing seems to make her condition, better.  Palpation makes the pain, worse patient denies fever, nausea, vomiting, weakness, dizziness, headache, blurred vision rash, chest pain, shortness of breath, fever, back pain, neck pain, or syncope. Past Medical History  Diagnosis Date  . Hypertension   . Diabetes mellitus without complication   . Hyperlipidemia   . Chest pain    History reviewed. No pertinent past surgical history. Family History  Problem Relation Age of Onset  . Heart disease Mother   . Diabetes Mother   . Hypertension Mother   . Hypertension Father   . Heart disease Father   . Heart disease Brother     both brothers  . Diabetes Brother   . Hypertension Brother    History  Substance Use Topics  . Smoking status: Former Smoker    Quit date: 07/05/1953  . Smokeless tobacco: Not on file  . Alcohol Use: No   OB History   Grav Para Term Preterm Abortions TAB SAB Ect Mult Living                 Review of Systems  All other systems negative except as documented in the HPI. All pertinent positives and negatives as reviewed in the HPI.  Allergies  Codeine and Sulfa antibiotics  Home Medications   Prior to Admission medications   Medication Sig Start Date End Date Taking? Authorizing Provider  acetaminophen (TYLENOL) 325 MG tablet Take 650 mg by mouth every 6 (six) hours as needed for pain.   Yes Historical  Provider, MD  amLODipine (NORVASC) 10 MG tablet Take 10 mg by mouth daily.   Yes Historical Provider, MD  aspirin 81 MG chewable tablet Chew 81 mg by mouth daily.   Yes Historical Provider, MD  Calcium-Magnesium-Vitamin D (CALCIUM 500 PO) Take 1 tablet by mouth 2 (two) times daily. Vitality Calcium   Yes Historical Provider, MD  glipiZIDE (GLUCOTROL XL) 10 MG 24 hr tablet Take 10 mg by mouth 2 (two) times daily.    Yes Historical Provider, MD  Glucosamine-Chondroitin (GLUCOSAMINE CHONDR COMPLEX PO) Take 2,000 mg by mouth daily.   Yes Historical Provider, MD  isosorbide mononitrate (IMDUR) 30 MG 24 hr tablet Take 0.5 tablets (15 mg total) by mouth daily. 07/05/13  Yes Lorretta Harp, MD  lisinopril (PRINIVIL,ZESTRIL) 40 MG tablet Take 40 mg by mouth daily.   Yes Historical Provider, MD  metFORMIN (GLUCOPHAGE) 500 MG tablet Take 500 mg by mouth daily with breakfast.    Yes Historical Provider, MD  metoprolol tartrate (LOPRESSOR) 25 MG tablet Take 25 mg by mouth 2 (two) times daily.   Yes Historical Provider, MD  Multiple Vitamin (MULTIVITAMIN WITH MINERALS) TABS Take 1 tablet by mouth 2 (two) times daily. Vitality Multivitamin & mineral   Yes Historical Provider, MD  pravastatin (PRAVACHOL) 20 MG tablet Take 20 mg by mouth daily.   Yes Historical Provider, MD  Propylene Glycol (SYSTANE BALANCE  OP) Apply 1 drop to eye 2 (two) times daily as needed (dry eyes). For dry eyes as needed   Yes Historical Provider, MD  sitaGLIPtin (JANUVIA) 100 MG tablet Take 100 mg by mouth at bedtime.   Yes Historical Provider, MD   BP 200/79  Pulse 63  Temp(Src) 98.4 F (36.9 C) (Oral)  Resp 16  SpO2 96% Physical Exam  Nursing note and vitals reviewed. Constitutional: She is oriented to person, place, and time. She appears well-developed and well-nourished. No distress.  HENT:  Head: Normocephalic and atraumatic.  Mouth/Throat: Oropharynx is clear and moist.  Eyes: Pupils are equal, round, and reactive to light.    Neck: Normal range of motion. Neck supple.  Cardiovascular: Normal rate, regular rhythm and normal heart sounds.  Exam reveals no gallop and no friction rub.   No murmur heard. Pulmonary/Chest: Effort normal and breath sounds normal. No respiratory distress. She has no wheezes.  Musculoskeletal:       Arms: Neurological: She is alert and oriented to person, place, and time. She exhibits normal muscle tone. Coordination normal.  Skin: Skin is warm and dry.    ED Course  Procedures (including critical care time) Labs Review Labs Reviewed  CBC - Abnormal; Notable for the following:    MCHC 36.4 (*)    All other components within normal limits  CBG MONITORING, ED - Abnormal; Notable for the following:    Glucose-Capillary 186 (*)    All other components within normal limits  [place], PA-C 07/29/13 1659

## 2013-07-29 NOTE — ED Notes (Signed)
[facility] called  

## 2013-07-30 LAB — GLUCOSE, CAPILLARY
GLUCOSE-CAPILLARY: 169 mg/dL — AB (ref 70–99)
GLUCOSE-CAPILLARY: 191 mg/dL — AB (ref 70–99)
Glucose-Capillary: 173 mg/dL — ABNORMAL HIGH (ref 70–99)
Glucose-Capillary: 216 mg/dL — ABNORMAL HIGH (ref 70–99)

## 2013-07-30 LAB — HEMOGLOBIN A1C
HEMOGLOBIN A1C: 7.5 % — AB (ref <5.7)
MEAN PLASMA GLUCOSE: 169 mg/dL — AB (ref <117)

## 2013-07-30 NOTE — Progress Notes (Signed)
Subjective: Feels a bit dizzy.  Pain adequately controlled. Ate well for breakfast.  Objective: Vital signs in last 24 hours: Temp:  [97.9 F (36.6 C)-98.4 F (36.9 C)] 98 F (36.7 C) (07/04 0627) Pulse Rate:  [63-80] 75 (07/04 0627) Resp:  [16-20] 20 (07/04 0627) BP: (123-200)/(58-88) 145/64 mmHg (07/04 0627) SpO2:  [92 %-98 %] 94 % (07/04 0627) Weight:  [87.998 kg (194 lb)] 87.998 kg (194 lb) (07/03 1700) Weight change:     Intake/Output from previous day: 07/03 0701 - 07/04 0700 In: 480 [P.O.:480] Out: -  Intake/Output this shift:    General appearance: alert and cooperative Resp: clear to auscultation bilaterally Cardio: regular rate and rhythm, S1, S2 normal, no murmur, click, rub or gallop GI: soft, non-tender; bowel sounds normal; no masses,  no organomegaly Extremities: extremities normal, atraumatic, no cyanosis or edema, Left arm dressed. Neurologic: Grossly normal   Lab Results:  Recent Labs  07/29/13 1331  WBC 8.6  HGB 15.0  HCT 41.2  PLT 217   BMET  Recent Labs  07/29/13 1331  NA 139  K 3.8  CL 99  CO2 25  GLUCOSE 216*  BUN 20  CREATININE 0.81  CALCIUM 10.0   CMET CMP     Component Value Date/Time   NA 139 07/29/2013 1331   K 3.8 07/29/2013 1331   CL 99 07/29/2013 1331   CO2 25 07/29/2013 1331   GLUCOSE 216* 07/29/2013 1331   BUN 20 07/29/2013 1331   CREATININE 0.81 07/29/2013 1331   CALCIUM 10.0 07/29/2013 1331   GFRNONAA 64* 07/29/2013 1331   GFRAA 75* 07/29/2013 1331     Studies/Results: Dg Elbow Complete Left  07/29/2013   CLINICAL DATA:  Localized wound infection  EXAM: LEFT ELBOW - COMPLETE 3+ VIEW  COMPARISON:  None.  FINDINGS: Generalized soft tissue swelling is noted. The underlying bony structures are within normal limits. No acute fracture is seen.  IMPRESSION: No acute bony abnormality is noted.  Generalized soft tissue changes are noted with focal swelling over the olecranon consistent with the given clinical history.   Electronically  Signed   By: Inez Catalina M.D.   On: 07/29/2013 14:33    Medications: I have reviewed the patient's current medications.  Marland Kitchen amLODipine  10 mg Oral Daily  . aspirin EC  81 mg Oral Daily  . cefTRIAXone (ROCEPHIN)  IV  1 g Intravenous Q24H  . enoxaparin (LOVENOX) injection  40 mg Subcutaneous Q24H  . insulin aspart  0-5 Units Subcutaneous QHS  . insulin aspart  0-9 Units Subcutaneous TID WC  . isosorbide mononitrate  15 mg Oral Daily  . linagliptin  5 mg Oral Daily  . lisinopril  40 mg Oral Daily  . metFORMIN  500 mg Oral Q breakfast  . metoprolol tartrate  25 mg Oral BID  . polyvinyl alcohol  1 drop Both Eyes BID  . simvastatin  10 mg Oral q1800  . sodium chloride  3 mL Intravenous Q12H  . vancomycin  750 mg Intravenous Q12H   CBG (last 3)   Recent Labs  07/29/13 1311 07/29/13 2202 07/30/13 0640  GLUCAP 186* 272* 173*      Assessment/Plan:  Active Problems:   Septic olecranon bursitis-s/p I and D.  On IV antibiotics.  Continue.   Appreciate Ortho help/care. DM2-stable overall HTN:  Cont. Current meds. Progressing as expected so far.   LOS: 1 day   Briya Lookabaugh A, MD 07/30/2013, 10:00 AM

## 2013-07-30 NOTE — Progress Notes (Signed)
Patient ID: Allison Duffy, female   DOB: October 15, 1926, 80 y.o.   MRN: [id number] We have discussed with the patient the issues regarding their infection to the extremity. We will continue antibiotics and await culture results. Often times it will take 3-5 days for cultures to become final. During this time we will typically have patients on intravenous antibiotics until we can find a parenteral route of antibiotic regime specific for the bacteria or organism isolated. We have discussed with the patient the need for daily irrigation and debridement as well as therapy to the area. We have discussed with the patient the necessity of range of motion to the involved joints as discussed today. We have discussed with the patient the unpredictability of infections at times. We'll continue to work towards good pain control and restoration of function. The patient understands the need for meticulous wound care and the necessity of proper followup.  The possible complications of stiffness (loss of motion), resistant infection, possible deep bone infection, possible chronic pain issues, possible need for multiple surgeries and even amputation.  With this in mind the patient understands our goal is to eradicate the infection to quiesence. We will continue to work towards these goals.   Procedure: Patient underwent irrigation debridement skin subcutaneous tissue with scissor and curet and knife blade this was an excisional debridement skin is a chance tissue at bedside with multiple rounds of saline lavage place. Following this she had a dressing placed similar to yesterday including Adaptic Xeroform gauze Kerlix and Ace wrap.  Overall she looks much better.  Cellulitis is still present but improved.  We'll continue IV antibiotics observation daily lavages and other measures to afford her the most aggressive infection care possible.  All questions have been encouraged and answered  Sharifah Champine M.D.

## 2013-07-31 LAB — COMPREHENSIVE METABOLIC PANEL
ALK PHOS: 87 U/L (ref 39–117)
ALT: 516 U/L — AB (ref 0–35)
AST: 747 U/L — ABNORMAL HIGH (ref 0–37)
Albumin: 3.3 g/dL — ABNORMAL LOW (ref 3.5–5.2)
Anion gap: 13 (ref 5–15)
BILIRUBIN TOTAL: 0.7 mg/dL (ref 0.3–1.2)
BUN: 17 mg/dL (ref 6–23)
CO2: 24 meq/L (ref 19–32)
Calcium: 8.6 mg/dL (ref 8.4–10.5)
Chloride: 97 mEq/L (ref 96–112)
Creatinine, Ser: 0.76 mg/dL (ref 0.50–1.10)
GFR calc Af Amer: 87 mL/min — ABNORMAL LOW (ref >90)
GFR, EST NON AFRICAN AMERICAN: 75 mL/min — AB (ref >90)
GLUCOSE: 225 mg/dL — AB (ref 70–99)
POTASSIUM: 4.3 meq/L (ref 3.7–5.3)
SODIUM: 134 meq/L — AB (ref 137–147)
Total Protein: 6.4 g/dL (ref 6.0–8.3)

## 2013-07-31 LAB — CBC WITH DIFFERENTIAL/PLATELET
BASOS PCT: 0 % (ref 0–1)
Basophils Absolute: 0 10*3/uL (ref 0.0–0.1)
EOS ABS: 0.1 10*3/uL (ref 0.0–0.7)
Eosinophils Relative: 1 % (ref 0–5)
HCT: 36.3 % (ref 36.0–46.0)
Hemoglobin: 12.3 g/dL (ref 12.0–15.0)
Lymphocytes Relative: 34 % (ref 12–46)
Lymphs Abs: 1.9 10*3/uL (ref 0.7–4.0)
MCH: 32 pg (ref 26.0–34.0)
MCHC: 33.9 g/dL (ref 30.0–36.0)
MCV: 94.5 fL (ref 78.0–100.0)
Monocytes Absolute: 0.3 10*3/uL (ref 0.1–1.0)
Monocytes Relative: 6 % (ref 3–12)
Neutro Abs: 3.4 10*3/uL (ref 1.7–7.7)
Neutrophils Relative %: 59 % (ref 43–77)
PLATELETS: 178 10*3/uL (ref 150–400)
RBC: 3.84 MIL/uL — ABNORMAL LOW (ref 3.87–5.11)
RDW: 12.2 % (ref 11.5–15.5)
WBC: 5.7 10*3/uL (ref 4.0–10.5)

## 2013-07-31 LAB — GLUCOSE, CAPILLARY
GLUCOSE-CAPILLARY: 253 mg/dL — AB (ref 70–99)
Glucose-Capillary: 228 mg/dL — ABNORMAL HIGH (ref 70–99)
Glucose-Capillary: 196 mg/dL — ABNORMAL HIGH (ref 70–99)
Glucose-Capillary: 192 mg/dL — ABNORMAL HIGH (ref 70–99)

## 2013-07-31 MED ORDER — SODIUM CHLORIDE 0.9 % IV SOLN
500.0000 mg | Freq: Three times a day (TID) | INTRAVENOUS | Status: DC
  Administered 2013-07-31 – 2013-08-02 (×6): 500 mg via INTRAVENOUS
  Filled 2013-07-31 (×8): qty 500

## 2013-07-31 MED ORDER — TRAMADOL HCL 50 MG PO TABS
50.0000 mg | ORAL_TABLET | Freq: Four times a day (QID) | ORAL | Status: DC | PRN
  Administered 2013-08-02: 50 mg via ORAL
  Filled 2013-07-31: qty 1

## 2013-07-31 NOTE — Progress Notes (Signed)
Patient ID: Allison Duffy, female   DOB: [birth year], 80 y.o.   MRN: [id number] Patient seen and examined at bedside.  Patient feels a bit tired today she states  She states her arm feels better and his pruritic  Procedure I've removed her dressing I performed irrigation and debridement of skin 16 his tissue this was an I&D at bedside excisional nature with curette knife blade and scissor. After lavage and remove the last bit of the Penrose drain. She tolerated the procedure well no comp dating features.  Overall wound conditions are much improved. Adaptic Xeroform and compressive wraps were placed.  I recommend elevation move and massage fingers  We'll check on her tomorrow hopefully home in the next day or so. She's made remarkable improvement but given her diabetes age and other factors this is usually a slow course in terms of complete resolution. Nevertheless I do feel we are head again now.  We have discussed with the patient the issues regarding their infection to the extremity. We will continue antibiotics and await culture results. Often times it will take 3-5 days for cultures to become final. During this time we will typically have patients on intravenous antibiotics until we can find a parenteral route of antibiotic regime specific for the bacteria or organism isolated. We have discussed with the patient the need for daily irrigation and debridement as well as therapy to the area. We have discussed with the patient the necessity of range of motion to the involved joints as discussed today. We have discussed with the patient the unpredictability of infections at times. We'll continue to work towards good pain control and restoration of function. The patient understands the need for meticulous wound care and the necessity of proper followup.  The possible complications of stiffness (loss of motion), resistant infection, possible deep bone infection, possible chronic pain issues, possible  need for multiple surgeries and even amputation.  With this in mind the patient understands our goal is to eradicate the infection to quiesence. We will continue to work towards these goals.  Results for orders placed during the hospital encounter of 07/29/13  GRAM STAIN     Status: None   Collection Time    07/29/13  8:38 PM      Result Value Ref Range Status   Specimen Description ABSCESS LEFT ELBOW BURSA   Final   Special Requests NONE   Final   Gram Stain     Final   Value: FEW WBC PRESENT, PREDOMINANTLY PMN     NO ORGANISMS SEEN     Gram Stain Report Called to,Read Back By and Verified With: ASHELL RN AT 2140 ON [id number] BY DLONG   Report Status 07/29/2013 FINAL   Final  ANAEROBIC CULTURE     Status: None   Collection Time    07/29/13  8:38 PM      Result Value Ref Range Status   Specimen Description ABSCESS LEFT ELBOW BURSA   Final   Special Requests NONE   Final   Gram Stain     Final   Value: FEW WBC PRESENT, PREDOMINANTLY PMN     NO ORGANISMS SEEN     Gram Stain Report Called to,Read Back By and Verified With: Gram Stain Report Called to,Read Back By and Verified With: ASHELL RN AT 2140 ON [id number] BY DLONG Performed by [facility]     Performed at [facility]   Culture     Final   Value: NO ANAEROBES  ISOLATED; CULTURE IN PROGRESS FOR 5 DAYS     Performed at [facility]   Report Status PENDING   Incomplete  CULTURE, ROUTINE-ABSCESS     Status: None   Collection Time    07/29/13  8:38 PM      Result Value Ref Range Status   Specimen Description ABSCESS   Final   Special Requests NONE LEFT ELBOW BURSA   Final   Gram Stain     Final   Value: FEW WBC PRESENT, PREDOMINANTLY PMN     NO ORGANISMS SEEN     Gram Stain Report Called to,Read Back By and Verified With: Gram Stain Report Called to,Read Back By and Verified With: ASHELL RN AT 2140 ON [id number] BY DLONG Performed by [facility]     Performed at [facility]   Culture      Final   Value: NO GROWTH 1 DAY     Performed at [facility]   Report Status PENDING   Incomplete

## 2013-07-31 NOTE — Progress Notes (Signed)
Subjective: Feels nauseated and not well.  Objective: Vital signs in last 24 hours: Temp:  [98.1 F (36.7 C)-99.7 F (37.6 C)] 99.3 F (37.4 C) (07/05 0552) Pulse Rate:  [59-86] 86 (07/05 0552) Resp:  [20] 20 (07/05 0552) BP: (111-152)/(36-59) 152/59 mmHg (07/05 0552) SpO2:  [92 %-95 %] 95 % (07/05 0552) FiO2 (%):  [1.5 %] 1.5 % (07/04 1457) Weight:  [88.179 kg (194 lb 6.4 oz)] 88.179 kg (194 lb 6.4 oz) (07/05 0552) Weight change: 0.181 kg (6.4 oz)    Intake/Output from previous day: 07/04 0701 - 07/05 0700 In: 890 [P.O.:840; IV Piggyback:50] Out: -  Intake/Output this shift: Total I/O In: 240 [P.O.:240] Out: -   General appearance: alert, cooperative and appears stated age Resp: clear to auscultation bilaterally Cardio: regular rate and rhythm, S1, S2 normal, no murmur, click, rub or gallop GI: soft, non-tender; bowel sounds normal; no masses,  no organomegaly Extremities: extremities normal, atraumatic, no cyanosis or edema Neurologic: Alert and oriented X 3, normal strength and tone. Normal symmetric reflexes. Normal coordination and gait   Lab Results:  Recent Labs  07/29/13 1331 07/31/13 0410  WBC 8.6 5.7  HGB 15.0 12.3  HCT 41.2 36.3  PLT 217 178   BMET  Recent Labs  07/29/13 1331 07/31/13 0410  NA 139 134*  K 3.8 4.3  CL 99 97  CO2 25 24  GLUCOSE 216* 225*  BUN 20 17  CREATININE 0.81 0.76  CALCIUM 10.0 8.6   CMET CMP     Component Value Date/Time   NA 134* 07/31/2013 0410   K 4.3 07/31/2013 0410   CL 97 07/31/2013 0410   CO2 24 07/31/2013 0410   GLUCOSE 225* 07/31/2013 0410   BUN 17 07/31/2013 0410   CREATININE 0.76 07/31/2013 0410   CALCIUM 8.6 07/31/2013 0410   PROT 6.4 07/31/2013 0410   ALBUMIN 3.3* 07/31/2013 0410   AST 747* 07/31/2013 0410   ALT 516* 07/31/2013 0410   ALKPHOS 87 07/31/2013 0410   BILITOT 0.7 07/31/2013 0410   GFRNONAA 75* 07/31/2013 0410   GFRAA 87* 07/31/2013 0410     Studies/Results: Dg Elbow Complete Left  07/29/2013   CLINICAL  DATA:  Localized wound infection  EXAM: LEFT ELBOW - COMPLETE 3+ VIEW  COMPARISON:  None.  FINDINGS: Generalized soft tissue swelling is noted. The underlying bony structures are within normal limits. No acute fracture is seen.  IMPRESSION: No acute bony abnormality is noted.  Generalized soft tissue changes are noted with focal swelling over the olecranon consistent with the given clinical history.   Electronically Signed   By: Inez Catalina M.D.   On: 07/29/2013 14:33    Medications: I have reviewed the patient's current medications.  Marland Kitchen amLODipine  10 mg Oral Daily  . aspirin EC  81 mg Oral Daily  . enoxaparin (LOVENOX) injection  40 mg Subcutaneous Q24H  . insulin aspart  0-5 Units Subcutaneous QHS  . insulin aspart  0-9 Units Subcutaneous TID WC  . isosorbide mononitrate  15 mg Oral Daily  . linagliptin  5 mg Oral Daily  . lisinopril  40 mg Oral Daily  . metFORMIN  500 mg Oral Q breakfast  . metoprolol tartrate  25 mg Oral BID  . polyvinyl alcohol  1 drop Both Eyes BID  . simvastatin  10 mg Oral q1800  . sodium chloride  3 mL Intravenous Q12H  . vancomycin  750 mg Intravenous Q12H   CBG (last 3)   Recent  Labs  07/30/13 1635 07/30/13 2232 07/31/13 0636  GLUCAP 169* 191* 228*      Assessment/Plan:  Active Problems:   Septic olecranon bursitis-change the rocephin to primaxin given her nausea.  Cont. vanco too for now.  We will see what further direction ortho gives. Not ready for d/c home today. Maybe in next day or too. DM2-stable. HTN-stable. Pain:  Take vicodin off her list. Does not tolerate vicodin or codeine. She has tylenol available. i have added tramadol at lower dose to see if she can tolerate that if needed.    LOS: 2 days   Jerlyn Ly, MD 07/31/2013, 12:20 PM

## 2013-07-31 NOTE — Progress Notes (Signed)
ANTIBIOTIC CONSULT NOTE - INITIAL  Pharmacy Consult for Primaxin Indication: wound infection  Allergies  Allergen Reactions  . Codeine Nausea And Vomiting  . Hydrocodone     Nausea and vomiting  . Sulfa Antibiotics Itching and Nausea Only    Patient Measurements: Height: 5' 5.5" (166.4 cm) (taken 07/20/13) Weight: 194 lb 6.4 oz (88.179 kg) IBW/kg (Calculated) : 58.15  Vital Signs: Temp: 99.3 F (37.4 C) (07/05 0552) Temp src: Oral (07/05 0552) BP: 152/59 mmHg (07/05 0552) Pulse Rate: 86 (07/05 0552) Intake/Output from previous day: 07/04 0701 - 07/05 0700 In: 890 [P.O.:840; IV Piggyback:50] Out: -  Intake/Output from this shift: Total I/O In: 240 [P.O.:240] Out: -   Labs:  Recent Labs  07/29/13 1331 07/31/13 0410  WBC 8.6 5.7  HGB 15.0 12.3  PLT 217 178  CREATININE 0.81 0.76   Estimated Creatinine Clearance: 57 ml/min (by C-G formula based on Cr of 0.76).    Microbiology: Recent Results (from the past 720 hour(s))  GRAM STAIN     Status: None   Collection Time    07/29/13  8:38 PM      Result Value Ref Range Status   Specimen Description ABSCESS LEFT ELBOW BURSA   Final   Special Requests NONE   Final   Gram Stain     Final   Value: FEW WBC PRESENT, PREDOMINANTLY PMN     NO ORGANISMS SEEN     Gram Stain Report Called to,Read Back By and Verified With: ASHELL RN AT 2140 ON [id number] BY DLONG   Report Status 07/29/2013 FINAL   Final  ANAEROBIC CULTURE     Status: None   Collection Time    07/29/13  8:38 PM      Result Value Ref Range Status   Specimen Description ABSCESS LEFT ELBOW BURSA   Final   Special Requests NONE   Final   Gram Stain     Final   Value: FEW WBC PRESENT, PREDOMINANTLY PMN     NO ORGANISMS SEEN     Gram Stain Report Called to,Read Back By and Verified With: Gram Stain Report Called to,Read Back By and Verified With: ASHELL RN AT 2140 ON [id number] BY DLONG Performed by [facility]     Performed at [facility]   Culture     Final   Value: NO ANAEROBES ISOLATED; CULTURE IN PROGRESS FOR 5 DAYS     Performed at [facility]   Report Status PENDING   Incomplete  CULTURE, ROUTINE-ABSCESS     Status: None   Collection Time    07/29/13  8:38 PM      Result Value Ref Range Status   Specimen Description ABSCESS   Final   Special Requests NONE LEFT ELBOW BURSA   Final   Gram Stain     Final   Value: FEW WBC PRESENT, PREDOMINANTLY PMN     NO ORGANISMS SEEN     Gram Stain Report Called to,Read Back By and Verified With: Gram Stain Report Called to,Read Back By and Verified With: ASHELL RN AT 2140 ON [id number] BY DLONG Performed by [facility]     Performed at [facility]   Culture     Final   Value: NO GROWTH 1 DAY     Performed at [facility]   Report Status PENDING   Incomplete    Medical History: Past Medical History  Diagnosis Date  . Hypertension   . Hyperlipidemia   .  Chest pain   . PONV (postoperative nausea and vomiting)   . Type II diabetes mellitus   . Arthritis     "scattered joints" (07/29/2013)  . Gout attack ?1980's    Medications:  Scheduled:  . amLODipine  10 mg Oral Daily  . aspirin EC  81 mg Oral Daily  . enoxaparin (LOVENOX) injection  40 mg Subcutaneous Q24H  . insulin aspart  0-5 Units Subcutaneous QHS  . insulin aspart  0-9 Units Subcutaneous TID WC  . isosorbide mononitrate  15 mg Oral Daily  . linagliptin  5 mg Oral Daily  . lisinopril  40 mg Oral Daily  . metFORMIN  500 mg Oral Q breakfast  . metoprolol tartrate  25 mg Oral BID  . polyvinyl alcohol  1 drop Both Eyes BID  . simvastatin  10 mg Oral q1800  . sodium chloride  3 mL Intravenous Q12H  . vancomycin  750 mg Intravenous Q12H   Assessment: 80 yo F on day #3 of IV antibiotics for septic olecranon bursitis and cellulitis.  Pt has been receiving Vancomycin and Rocephin but recently developed some nausea.  MD has asked pharmacy to change Rocephin to Primaxin.  SCr 0.8 with  CrCl ~ 60 ml/min.  WBC wnl.  Awaiting ortho recommendations re: further I&D.  Goal of Therapy:  Renal dose adjustment of medication  Plan:  Primaxin 500 mg IV q8h  [employer], Pharm.D., BCPS Clinical Pharmacist Pager [phone] 07/31/2013 1:07 PM

## 2013-08-01 LAB — CBC WITH DIFFERENTIAL/PLATELET
BASOS ABS: 0 10*3/uL (ref 0.0–0.1)
Basophils Relative: 0 % (ref 0–1)
EOS ABS: 0.2 10*3/uL (ref 0.0–0.7)
EOS PCT: 2 % (ref 0–5)
HCT: 34.8 % — ABNORMAL LOW (ref 36.0–46.0)
Hemoglobin: 11.8 g/dL — ABNORMAL LOW (ref 12.0–15.0)
LYMPHS ABS: 2 10*3/uL (ref 0.7–4.0)
Lymphocytes Relative: 24 % (ref 12–46)
MCH: 32.3 pg (ref 26.0–34.0)
MCHC: 33.9 g/dL (ref 30.0–36.0)
MCV: 95.3 fL (ref 78.0–100.0)
Monocytes Absolute: 0.7 10*3/uL (ref 0.1–1.0)
Monocytes Relative: 8 % (ref 3–12)
NEUTROS PCT: 66 % (ref 43–77)
Neutro Abs: 5.6 10*3/uL (ref 1.7–7.7)
Platelets: 167 10*3/uL (ref 150–400)
RBC: 3.65 MIL/uL — AB (ref 3.87–5.11)
RDW: 12.1 % (ref 11.5–15.5)
WBC: 8.6 10*3/uL (ref 4.0–10.5)

## 2013-08-01 LAB — COMPREHENSIVE METABOLIC PANEL
ALT: 285 U/L — AB (ref 0–35)
AST: 153 U/L — ABNORMAL HIGH (ref 0–37)
Albumin: 3.3 g/dL — ABNORMAL LOW (ref 3.5–5.2)
Alkaline Phosphatase: 71 U/L (ref 39–117)
Anion gap: 13 (ref 5–15)
BUN: 13 mg/dL (ref 6–23)
CALCIUM: 8.5 mg/dL (ref 8.4–10.5)
CHLORIDE: 101 meq/L (ref 96–112)
CO2: 23 meq/L (ref 19–32)
Creatinine, Ser: 0.71 mg/dL (ref 0.50–1.10)
GFR, EST AFRICAN AMERICAN: 89 mL/min — AB (ref >90)
GFR, EST NON AFRICAN AMERICAN: 76 mL/min — AB (ref >90)
GLUCOSE: 212 mg/dL — AB (ref 70–99)
Potassium: 4 mEq/L (ref 3.7–5.3)
SODIUM: 137 meq/L (ref 137–147)
Total Bilirubin: 0.5 mg/dL (ref 0.3–1.2)
Total Protein: 6.5 g/dL (ref 6.0–8.3)

## 2013-08-01 LAB — GLUCOSE, CAPILLARY
Glucose-Capillary: 221 mg/dL — ABNORMAL HIGH (ref 70–99)
Glucose-Capillary: 193 mg/dL — ABNORMAL HIGH (ref 70–99)
Glucose-Capillary: 189 mg/dL — ABNORMAL HIGH (ref 70–99)
Glucose-Capillary: 237 mg/dL — ABNORMAL HIGH (ref 70–99)

## 2013-08-01 LAB — VANCOMYCIN, TROUGH: VANCOMYCIN TR: 11.3 ug/mL (ref 10.0–20.0)

## 2013-08-01 MED ORDER — DOXYCYCLINE HYCLATE 100 MG PO TABS
100.0000 mg | ORAL_TABLET | Freq: Two times a day (BID) | ORAL | Status: DC
  Administered 2013-08-01 – 2013-08-02 (×2): 100 mg via ORAL
  Filled 2013-08-01 (×3): qty 1

## 2013-08-01 NOTE — Progress Notes (Signed)
Patient ID: Allison Duffy, female   DOB: [birth year], 80 y.o.   MRN: [id number] Patient is doing well.  I changed her bandage she looks quite well.  We can go ahead and water should the elbow with foam soap and water followed by Neosporin and protective wrap.  Elevate move and massage will be the goals. She is in a keep her elbow off hard surfaces  I discussed her all issues.  Dr. Reynaldo Minium was here and is going over all the planning.  If any problems arise I'll be immediately available.  W Nataliee Shurtz M.D.

## 2013-08-01 NOTE — Discharge Summary (Signed)
DISCHARGE SUMMARY  Allison Duffy  MR#: [id number]  DOB:1926-05-08  Date of Admission: 07/29/2013 Date of Discharge: 08/01/2013  Attending Physician:Aleese Kamps A  Patient's KDX:[id number] A, MD  Consults:  ortho, dr Amedeo Plenty  Discharge Diagnoses: Active Problems:   Septic olecranon bursitis   DM2   Essential hypertension   CAD   hyperlipidemia  Discharge Medications:   Medication List    ASK your doctor about these medications       acetaminophen 325 MG tablet  Commonly known as:  TYLENOL  Take 650 mg by mouth every 6 (six) hours as needed for pain.     amLODipine 10 MG tablet  Commonly known as:  NORVASC  Take 10 mg by mouth daily.     aspirin 81 MG chewable tablet  Chew 81 mg by mouth daily.     CALCIUM 500 PO  Take 1 tablet by mouth 2 (two) times daily. Vitality Calcium     glipiZIDE 10 MG 24 hr tablet  Commonly known as:  GLUCOTROL XL  Take 10 mg by mouth 2 (two) times daily.     GLUCOSAMINE CHONDR COMPLEX PO  Take 2,000 mg by mouth daily.     isosorbide mononitrate 30 MG 24 hr tablet  Commonly known as:  IMDUR  Take 0.5 tablets (15 mg total) by mouth daily.     lisinopril 40 MG tablet  Commonly known as:  PRINIVIL,ZESTRIL  Take 40 mg by mouth daily.     metFORMIN 500 MG tablet  Commonly known as:  GLUCOPHAGE  Take 500 mg by mouth daily with breakfast.     metoprolol tartrate 25 MG tablet  Commonly known as:  LOPRESSOR  Take 25 mg by mouth 2 (two) times daily.     multivitamin with minerals Tabs tablet  Take 1 tablet by mouth 2 (two) times daily. Vitality Multivitamin & mineral     pravastatin 20 MG tablet  Commonly known as:  PRAVACHOL  Take 20 mg by mouth daily.     sitaGLIPtin 100 MG tablet  Commonly known as:  JANUVIA  Take 100 mg by mouth at bedtime.     SYSTANE BALANCE OP  Apply 1 drop to eye 2 (two) times daily as needed (dry eyes). For dry eyes as needed        Hospital Procedures: Dg Elbow Complete Left  07/29/2013    CLINICAL DATA:  Localized wound infection  EXAM: LEFT ELBOW - COMPLETE 3+ VIEW  COMPARISON:  None.  FINDINGS: Generalized soft tissue swelling is noted. The underlying bony structures are within normal limits. No acute fracture is seen.  IMPRESSION: No acute bony abnormality is noted.  Generalized soft tissue changes are noted with focal swelling over the olecranon consistent with the given clinical history.   Electronically Signed   By: Inez Catalina M.D.   On: 07/29/2013 14:33    History of Present Illness:  Allison Duffy is a pleasant 80 yo female with pmhx as below. 2 days ago she noticed the left elbow was itching and painful. She put some anti-itch cream on it. Yesterday she put neosporin on it. But, it kept getting worse. Last night it was very painful. She feels nauseated. No fever but maybe some subjective temp last night. She has cp and sob a lot but she says this is no different than usual. She has seen cards Dr. Gwenlyn Found for this in the past.  The elbow became more swollen yesterday and even more swollen today  Hospital Course: Admitted  and placed on broad spectrum antibiotics.  Dr. Amedeo Plenty consulted, performed I$D and daily dressing changes.  Elbow improved. At discharge minimal drainiage, stil residual induration and erythema--no fluctuance.  Stable medically.  Severely limited mobility with one arm, unable to get up and down and lives alone.  Gait unstable--weak and deconditioned, two person assist. D/C to snf for wound care and PT/OT.  Diet is regular. Elbow is cleansed with soapy water daily, neorsporin applied, bulk protective wrap daily  Day of Discharge Exam BP 111/83  Pulse 79  Temp(Src) 98.1 F (36.7 C) (Oral)  Resp 18  Ht 5' 5.5" (1.664 m)  Wt 87.454 kg (192 lb 12.8 oz)  BMI 31.58 kg/m2  SpO2 92%  Physical Exam: General appearance: alert, cooperative and no distress Eyes: no scleral icterus Throat: oropharynx moist without erythema Resp: clear to auscultation  bilaterally Cardio: regular rate and rhythm Extremities: no clubbing, cyanosis or edema Neuro normal Left elbow, eryhtema, warnth, induration  Discharge Labs:  Recent Labs  07/31/13 0410 08/01/13 0424  NA 134* 137  K 4.3 4.0  CL 97 101  CO2 24 23  GLUCOSE 225* 212*  BUN 17 13  CREATININE 0.76 0.71  CALCIUM 8.6 8.5    Recent Labs  07/31/13 0410 08/01/13 0424  AST 747* 153*  ALT 516* 285*  ALKPHOS 87 71  BILITOT 0.7 0.5  PROT 6.4 6.5  ALBUMIN 3.3* 3.3*    Recent Labs  07/31/13 0410 08/01/13 0424  WBC 5.7 8.6  NEUTROABS 3.4 5.6  HGB 12.3 11.8*  HCT 36.3 34.8*  MCV 94.5 95.3  PLT 178 167   No results found for this basename: CKTOTAL, CKMB, CKMBINDEX, TROPONINI,  in the last 72 hours No results found for this basename: TSH, T4TOTAL, FREET3, T3FREE, THYROIDAB,  in the last 72 hours No results found for this basename: VITAMINB12, FOLATE, FERRITIN, TIBC, IRON, RETICCTPCT,  in the last 72 hours  Discharge instructions:   Disposition: snd  Follow-up Appts: Follow-up with Dr. Reynaldo Minium at [facility] in 1.  Call for appointment.  Condition on Discharge: stable  Tests Needing Follow-up: none  Signed: Syriana Croslin A 08/01/2013, 7:03 PM

## 2013-08-01 NOTE — Progress Notes (Signed)
ANTIBIOTIC CONSULT NOTE   Pharmacy Consult for Vancomycin Indication: Septic olecranon bursitis  Allergies  Allergen Reactions  . Codeine Nausea And Vomiting  . Hydrocodone     Nausea and vomiting  . Sulfa Antibiotics Itching and Nausea Only    Patient Measurements: Wt=88 kg  Vital Signs: Temp: 98.9 F (37.2 C) (07/06 0601) Temp src: Oral (07/06 0601) BP: 152/100 mmHg (07/06 0601) Pulse Rate: 89 (07/06 0601) Intake/Output from previous day: 07/05 0701 - 07/06 0700 In: 720 [P.O.:720] Out: -  Intake/Output from this shift:    Labs:  Recent Labs  07/29/13 1331 07/31/13 0410 08/01/13 0424  WBC 8.6 5.7 8.6  HGB 15.0 12.3 11.8*  PLT 217 178 167  CREATININE 0.81 0.76 0.71   Estimated Creatinine Clearance: 56.7 ml/min (by C-G formula based on Cr of 0.71).  Recent Labs  08/01/13 0424  [place] 11.3     Microbiology: Recent Results (from the past 720 hour(s))  GRAM STAIN     Status: None   Collection Time    07/29/13  8:38 PM      Result Value Ref Range Status   Specimen Description ABSCESS LEFT ELBOW BURSA   Final   Special Requests NONE   Final   Gram Stain     Final   Value: FEW WBC PRESENT, PREDOMINANTLY PMN     NO ORGANISMS SEEN     Gram Stain Report Called to,Read Back By and Verified With: ASHELL RN AT 2140 ON [id number] BY DLONG   Report Status 07/29/2013 FINAL   Final  ANAEROBIC CULTURE     Status: None   Collection Time    07/29/13  8:38 PM      Result Value Ref Range Status   Specimen Description ABSCESS LEFT ELBOW BURSA   Final   Special Requests NONE   Final   Gram Stain     Final   Value: FEW WBC PRESENT, PREDOMINANTLY PMN     NO ORGANISMS SEEN     Gram Stain Report Called to,Read Back By and Verified With: Gram Stain Report Called to,Read Back By and Verified With: ASHELL RN AT 2140 ON [id number] BY DLONG Performed by [facility]     Performed at [facility]   Culture     Final   Value: NO ANAEROBES ISOLATED; CULTURE IN  PROGRESS FOR 5 DAYS     Performed at [facility]   Report Status PENDING   Incomplete  CULTURE, ROUTINE-ABSCESS     Status: None   Collection Time    07/29/13  8:38 PM      Result Value Ref Range Status   Specimen Description ABSCESS   Final   Special Requests NONE LEFT ELBOW BURSA   Final   Gram Stain     Final   Value: FEW WBC PRESENT, PREDOMINANTLY PMN     NO ORGANISMS SEEN     Gram Stain Report Called to,Read Back By and Verified With: Gram Stain Report Called to,Read Back By and Verified With: ASHELL RN AT 2140 ON [id number] BY DLONG Performed by [facility]     Performed at [facility]   Culture     Final   Value: NO GROWTH 1 DAY     Performed at [facility]   Report Status PENDING   Incomplete    Medical History: Past Medical History  Diagnosis Date  . Hypertension   . Hyperlipidemia   . Chest pain   .  PONV (postoperative nausea and vomiting)   . Type II diabetes mellitus   . Arthritis     "scattered joints" (07/29/2013)  . Gout attack ?1980's    Medications:  Scheduled:  . amLODipine  10 mg Oral Daily  . aspirin EC  81 mg Oral Daily  . enoxaparin (LOVENOX) injection  40 mg Subcutaneous Q24H  . imipenem-cilastatin  500 mg Intravenous 3 times per day  . insulin aspart  0-5 Units Subcutaneous QHS  . insulin aspart  0-9 Units Subcutaneous TID WC  . isosorbide mononitrate  15 mg Oral Daily  . linagliptin  5 mg Oral Daily  . lisinopril  40 mg Oral Daily  . metFORMIN  500 mg Oral Q breakfast  . metoprolol tartrate  25 mg Oral BID  . polyvinyl alcohol  1 drop Both Eyes BID  . simvastatin  10 mg Oral q1800  . sodium chloride  3 mL Intravenous Q12H  . vancomycin  750 mg Intravenous Q12H   Infusions:    Assessment: 29 yoF with hx of HTN, DM, hyperlipidemia and chest pain c/o left elbow itching and pain. Vancomycin and primaxin  per Rx for probable septic olecranon bursitis. Vanc trough this am less than goal 15-20 mg/L but level is in  therapeutic range of 10-20 mg/L.  Given advanced age and risk for accumulation and significant improvement on current dose, will not change dose at this time.  Goal of Therapy:  Vancomycin trough level 15-20 mcg/ml  Plan:   Cont Vancomycin 750mg  IV q12h  F/U SCr/levels/cultures as needed  Allison Duffy 08/01/2013,7:45 AM

## 2013-08-01 NOTE — Progress Notes (Signed)
Inpatient Diabetes Program Recommendations  AACE/ADA: New Consensus Statement on Inpatient Glycemic Control (2013)  Target Ranges:  Prepandial:   less than 140 mg/dL      Peak postprandial:   less than 180 mg/dL (1-2 hours)      Critically ill patients:  140 - 180 mg/dL   Reason for Assessment:  Results for Allison Duffy, [name] (MRN [id number]) as of 08/01/2013 11:00  Ref. Range 07/31/2013 06:36 07/31/2013 12:04 07/31/2013 16:23 07/31/2013 21:48 08/01/2013 07:04  Glucose-Capillary Latest Range: 70-99 mg/dL 228 (H) 196 (H) 192 (H) 253 (H) 221 (H)   Diabetes history: Type 2 diabetes Outpatient Diabetes medications:  Glipizide 10 mg bid, Metformin 500 mg daily, and Januvia 100 mg q HS Current orders for Inpatient glycemic control:  Consider adding low dose basal insulin, while patient is in the hospital.  Consider Levemir 8 units q HS.    Adah Perl, RN, BC-ADM Inpatient Diabetes Coordinator Pager [phone]

## 2013-08-02 DIAGNOSIS — K5900 Constipation, unspecified: Secondary | ICD-10-CM | POA: Diagnosis not present

## 2013-08-02 DIAGNOSIS — I2510 Atherosclerotic heart disease of native coronary artery without angina pectoris: Secondary | ICD-10-CM | POA: Diagnosis not present

## 2013-08-02 DIAGNOSIS — E785 Hyperlipidemia, unspecified: Secondary | ICD-10-CM | POA: Diagnosis not present

## 2013-08-02 DIAGNOSIS — M25429 Effusion, unspecified elbow: Secondary | ICD-10-CM | POA: Diagnosis not present

## 2013-08-02 DIAGNOSIS — L089 Local infection of the skin and subcutaneous tissue, unspecified: Secondary | ICD-10-CM | POA: Diagnosis not present

## 2013-08-02 DIAGNOSIS — R262 Difficulty in walking, not elsewhere classified: Secondary | ICD-10-CM | POA: Diagnosis not present

## 2013-08-02 DIAGNOSIS — M7020 Olecranon bursitis, unspecified elbow: Secondary | ICD-10-CM | POA: Diagnosis not present

## 2013-08-02 DIAGNOSIS — E119 Type 2 diabetes mellitus without complications: Secondary | ICD-10-CM | POA: Diagnosis not present

## 2013-08-02 DIAGNOSIS — M25529 Pain in unspecified elbow: Secondary | ICD-10-CM | POA: Diagnosis not present

## 2013-08-02 DIAGNOSIS — M6281 Muscle weakness (generalized): Secondary | ICD-10-CM | POA: Diagnosis not present

## 2013-08-02 DIAGNOSIS — IMO0002 Reserved for concepts with insufficient information to code with codable children: Secondary | ICD-10-CM | POA: Diagnosis not present

## 2013-08-02 DIAGNOSIS — E1129 Type 2 diabetes mellitus with other diabetic kidney complication: Secondary | ICD-10-CM | POA: Diagnosis not present

## 2013-08-02 DIAGNOSIS — T148XXA Other injury of unspecified body region, initial encounter: Secondary | ICD-10-CM | POA: Diagnosis not present

## 2013-08-02 DIAGNOSIS — R269 Unspecified abnormalities of gait and mobility: Secondary | ICD-10-CM | POA: Diagnosis not present

## 2013-08-02 DIAGNOSIS — I10 Essential (primary) hypertension: Secondary | ICD-10-CM | POA: Diagnosis not present

## 2013-08-02 LAB — COMPREHENSIVE METABOLIC PANEL
ALK PHOS: 68 U/L (ref 39–117)
ALT: 185 U/L — AB (ref 0–35)
ANION GAP: 16 — AB (ref 5–15)
AST: 58 U/L — ABNORMAL HIGH (ref 0–37)
Albumin: 3.4 g/dL — ABNORMAL LOW (ref 3.5–5.2)
BILIRUBIN TOTAL: 0.6 mg/dL (ref 0.3–1.2)
BUN: 8 mg/dL (ref 6–23)
CHLORIDE: 99 meq/L (ref 96–112)
CO2: 22 mEq/L (ref 19–32)
Calcium: 8.6 mg/dL (ref 8.4–10.5)
Creatinine, Ser: 0.56 mg/dL (ref 0.50–1.10)
GFR calc non Af Amer: 83 mL/min — ABNORMAL LOW (ref >90)
GLUCOSE: 247 mg/dL — AB (ref 70–99)
POTASSIUM: 3.7 meq/L (ref 3.7–5.3)
Sodium: 137 mEq/L (ref 137–147)
Total Protein: 6.7 g/dL (ref 6.0–8.3)

## 2013-08-02 LAB — CULTURE, ROUTINE-ABSCESS: CULTURE: NO GROWTH

## 2013-08-02 LAB — CBC WITH DIFFERENTIAL/PLATELET
BASOS ABS: 0 10*3/uL (ref 0.0–0.1)
BASOS PCT: 0 % (ref 0–1)
EOS ABS: 0.2 10*3/uL (ref 0.0–0.7)
Eosinophils Relative: 2 % (ref 0–5)
HCT: 36.5 % (ref 36.0–46.0)
Hemoglobin: 12.5 g/dL (ref 12.0–15.0)
Lymphocytes Relative: 18 % (ref 12–46)
Lymphs Abs: 1.6 10*3/uL (ref 0.7–4.0)
MCH: 33.2 pg (ref 26.0–34.0)
MCHC: 34.2 g/dL (ref 30.0–36.0)
MCV: 96.8 fL (ref 78.0–100.0)
MONO ABS: 0.8 10*3/uL (ref 0.1–1.0)
Monocytes Relative: 9 % (ref 3–12)
NEUTROS PCT: 71 % (ref 43–77)
Neutro Abs: 6.5 10*3/uL (ref 1.7–7.7)
Platelets: 172 10*3/uL (ref 150–400)
RBC: 3.77 MIL/uL — ABNORMAL LOW (ref 3.87–5.11)
RDW: 12.2 % (ref 11.5–15.5)
WBC: 9.2 10*3/uL (ref 4.0–10.5)

## 2013-08-02 LAB — GLUCOSE, CAPILLARY
GLUCOSE-CAPILLARY: 246 mg/dL — AB (ref 70–99)
Glucose-Capillary: 208 mg/dL — ABNORMAL HIGH (ref 70–99)

## 2013-08-02 MED ORDER — DOXYCYCLINE HYCLATE 100 MG PO TABS: 100.0000 mg | ORAL_TABLET | Freq: Two times a day (BID) | ORAL | Status: DC

## 2013-08-02 MED ORDER — INSULIN ASPART 100 UNIT/ML ~~LOC~~ SOLN: 0.0000 [IU] | Freq: Three times a day (TID) | SUBCUTANEOUS | Status: DC

## 2013-08-02 MED ORDER — ENOXAPARIN SODIUM 40 MG/0.4ML ~~LOC~~ SOLN: 40.0000 mg | SUBCUTANEOUS | Status: DC

## 2013-08-02 MED ORDER — TRAMADOL HCL 50 MG PO TABS: 50.0000 mg | ORAL_TABLET | Freq: Four times a day (QID) | ORAL | Status: DC | PRN

## 2013-08-02 NOTE — Progress Notes (Addendum)
Patient's O2 sats range from 88-92 percent on RA. IM notified ([facility].) No new orders.

## 2013-08-02 NOTE — Progress Notes (Signed)
Clinical Social Work Department BRIEF PSYCHOSOCIAL ASSESSMENT 08/02/2013  Patient:  Allison Duffy, Allison Duffy     Account Number:  [id number]     [place] date:  07/29/2013  Clinical Social Worker:  Valda Lamb  Date/Time:  08/02/2013 10:11 AM  Referred by:  Physician  Date Referred:  08/02/2013 Referred for  SNF Placement   Other Referral:   Interview type:  Patient Other interview type:    PSYCHOSOCIAL DATA Living Status:  ALONE Admitted from facility:   Level of care:   Primary support name:  Glenice Bow [phone] Primary support relationship to patient:  CHILD, ADULT Degree of support available:   Pt lives home alone however appears to have significant family support.    CURRENT CONCERNS Current Concerns  Post-Acute Placement   Other Concerns:    SOCIAL WORK ASSESSMENT / PLAN CSW informed that pt is ready for discharge to [facility] today. Pt MD has assisted in pt being admitted into the facility, facility aware and agreeable.    CSW confirmed dc plans with pt who is agreeable and appreciative of plan. No additional concerns.   Assessment/plan status:  Psychosocial Support/Ongoing Assessment of Needs Other assessment/ plan:   Information/referral to community resources:   No resources needed as pt and MD planned for pt to dc to [facility].    PATIENT'S/FAMILY'S RESPONSE TO PLAN OF CARE: Pt presents alert and oriented and agreeable to dc to [facility] today.    No additional needs, CSW signing off.

## 2013-08-02 NOTE — Progress Notes (Signed)
Subjective: Awake, restless, no concerns  Objective: Vital signs in last 24 hours: Temp:  [98.1 F (36.7 C)-98.9 F (37.2 C)] 98.9 F (37.2 C) (07/07 0513) Pulse Rate:  [79-85] 81 (07/07 0513) Resp:  [18] 18 (07/07 0513) BP: (111-163)/(71-83) 159/71 mmHg (07/07 0513) SpO2:  [91 %-100 %] 91 % (07/07 0513) Weight:  [92.897 kg (204 lb 12.8 oz)] 92.897 kg (204 lb 12.8 oz) (07/07 0413) Weight change: 5.443 kg (12 lb)  CBG (last 3)   Recent Labs  08/01/13 1147 08/01/13 1641 08/01/13 2111  GLUCAP 193* 189* 237*    Intake/Output from previous day: 07/06 0701 - 07/07 0700 In: 2230 [P.O.:680; IV Piggyback:1550] Out: -   Physical Exam: Awake, alert No jvd, good facial symmetry Chest clear RRR, normal s1s2, 1/6 SEM Abdomen soft nontender Calves soft, no edema, good pul;ses Neuro normal Left elbow no drain no fluctuance, minimal warmth induration all improved   Lab Results:  Recent Labs  07/31/13 0410 08/01/13 0424  NA 134* 137  K 4.3 4.0  CL 97 101  CO2 24 23  GLUCOSE 225* 212*  BUN 17 13  CREATININE 0.76 0.71  CALCIUM 8.6 8.5    Recent Labs  07/31/13 0410 08/01/13 0424  AST 747* 153*  ALT 516* 285*  ALKPHOS 87 71  BILITOT 0.7 0.5  PROT 6.4 6.5  ALBUMIN 3.3* 3.3*    Recent Labs  08/01/13 0424 08/02/13 0520  WBC 8.6 9.2  NEUTROABS 5.6 6.5  HGB 11.8* 12.5  HCT 34.8* 36.5  MCV 95.3 96.8  PLT 167 172   No results found for this basename: INR, PROTIME   No results found for this basename: CKTOTAL, CKMB, CKMBINDEX, TROPONINI,  in the last 72 hours No results found for this basename: TSH, T4TOTAL, FREET3, T3FREE, THYROIDAB,  in the last 72 hours No results found for this basename: VITAMINB12, FOLATE, FERRITIN, TIBC, IRON, RETICCTPCT,  in the last 72 hours  Studies/Results: No results found.   Assessment/Plan: 1. Olecranon bursitis, infected-improved will need 3 weeks doxy 2. DM2 stable 3. CAD stable 4. HTN stable  To snf today    LOS: 4  days   Allison Duffy A 08/02/2013, 6:54 AM

## 2013-08-02 NOTE — Progress Notes (Signed)
CSW spoke with pt daughter [name] who confirmed that she would like to take pt to the facility by car. CSW has placed dc packet in pt shadow chart. RN and daughter aware that pt will need to have packet with her when she dc's. No additional needs, CSW signing off.  Hunt Oris, MSW, [place]

## 2013-08-03 LAB — ANAEROBIC CULTURE

## 2013-08-04 ENCOUNTER — Telehealth: Payer: Self-pay | Admitting: Clinical

## 2013-08-04 DIAGNOSIS — T148XXA Other injury of unspecified body region, initial encounter: Secondary | ICD-10-CM | POA: Diagnosis not present

## 2013-08-04 DIAGNOSIS — M6281 Muscle weakness (generalized): Secondary | ICD-10-CM | POA: Diagnosis not present

## 2013-08-04 DIAGNOSIS — R269 Unspecified abnormalities of gait and mobility: Secondary | ICD-10-CM | POA: Diagnosis not present

## 2013-08-04 DIAGNOSIS — L089 Local infection of the skin and subcutaneous tissue, unspecified: Secondary | ICD-10-CM | POA: Diagnosis not present

## 2013-08-04 DIAGNOSIS — E1129 Type 2 diabetes mellitus with other diabetic kidney complication: Secondary | ICD-10-CM | POA: Diagnosis not present

## 2013-08-04 NOTE — Telephone Encounter (Signed)
CSW received a call from pt daughter Jacqualine Code. Daughter expressed concern that her mother was not discharged with an antibiotic that she was told pt would need at [facility]. CSW informed daughter that CSW would notify Dr. Reynaldo Minium of daughter's concern. Daughter appreciative and states she too has left a message for MD at his clinic and will await a returned call. Daughter confirmed that pt is now receiving a/b after call was made to MD's office.  CSW has left a message for MD to contact daughter.  Hunt Oris, MSW, [place]

## 2013-08-04 NOTE — Telephone Encounter (Signed)
Encounter complete. 

## 2013-08-05 DIAGNOSIS — K5900 Constipation, unspecified: Secondary | ICD-10-CM | POA: Diagnosis not present

## 2013-08-05 DIAGNOSIS — M6281 Muscle weakness (generalized): Secondary | ICD-10-CM | POA: Diagnosis not present

## 2013-08-05 DIAGNOSIS — M7020 Olecranon bursitis, unspecified elbow: Secondary | ICD-10-CM | POA: Diagnosis not present

## 2013-08-05 DIAGNOSIS — L089 Local infection of the skin and subcutaneous tissue, unspecified: Secondary | ICD-10-CM | POA: Diagnosis not present

## 2013-08-05 DIAGNOSIS — M25529 Pain in unspecified elbow: Secondary | ICD-10-CM | POA: Diagnosis not present

## 2013-08-05 DIAGNOSIS — E1129 Type 2 diabetes mellitus with other diabetic kidney complication: Secondary | ICD-10-CM | POA: Diagnosis not present

## 2013-08-05 DIAGNOSIS — R269 Unspecified abnormalities of gait and mobility: Secondary | ICD-10-CM | POA: Diagnosis not present

## 2013-08-11 DIAGNOSIS — M25529 Pain in unspecified elbow: Secondary | ICD-10-CM | POA: Diagnosis not present

## 2013-08-11 DIAGNOSIS — M25429 Effusion, unspecified elbow: Secondary | ICD-10-CM | POA: Diagnosis not present

## 2013-08-11 DIAGNOSIS — IMO0002 Reserved for concepts with insufficient information to code with codable children: Secondary | ICD-10-CM | POA: Diagnosis not present

## 2013-08-11 DIAGNOSIS — M7020 Olecranon bursitis, unspecified elbow: Secondary | ICD-10-CM | POA: Diagnosis not present

## 2013-08-18 DIAGNOSIS — I2510 Atherosclerotic heart disease of native coronary artery without angina pectoris: Secondary | ICD-10-CM | POA: Diagnosis not present

## 2013-08-18 DIAGNOSIS — Z5189 Encounter for other specified aftercare: Secondary | ICD-10-CM | POA: Diagnosis not present

## 2013-08-18 DIAGNOSIS — E785 Hyperlipidemia, unspecified: Secondary | ICD-10-CM | POA: Diagnosis not present

## 2013-08-18 DIAGNOSIS — R269 Unspecified abnormalities of gait and mobility: Secondary | ICD-10-CM | POA: Diagnosis not present

## 2013-08-18 DIAGNOSIS — M6281 Muscle weakness (generalized): Secondary | ICD-10-CM | POA: Diagnosis not present

## 2013-08-18 DIAGNOSIS — I10 Essential (primary) hypertension: Secondary | ICD-10-CM | POA: Diagnosis not present

## 2013-08-18 DIAGNOSIS — E119 Type 2 diabetes mellitus without complications: Secondary | ICD-10-CM | POA: Diagnosis not present

## 2013-08-18 DIAGNOSIS — M7020 Olecranon bursitis, unspecified elbow: Secondary | ICD-10-CM | POA: Diagnosis not present

## 2013-08-18 DIAGNOSIS — G609 Hereditary and idiopathic neuropathy, unspecified: Secondary | ICD-10-CM | POA: Diagnosis not present

## 2013-08-23 DIAGNOSIS — M7020 Olecranon bursitis, unspecified elbow: Secondary | ICD-10-CM | POA: Diagnosis not present

## 2013-08-23 DIAGNOSIS — E119 Type 2 diabetes mellitus without complications: Secondary | ICD-10-CM | POA: Diagnosis not present

## 2013-08-23 DIAGNOSIS — Z5189 Encounter for other specified aftercare: Secondary | ICD-10-CM | POA: Diagnosis not present

## 2013-08-23 DIAGNOSIS — I10 Essential (primary) hypertension: Secondary | ICD-10-CM | POA: Diagnosis not present

## 2013-08-23 DIAGNOSIS — G609 Hereditary and idiopathic neuropathy, unspecified: Secondary | ICD-10-CM | POA: Diagnosis not present

## 2013-08-23 DIAGNOSIS — R269 Unspecified abnormalities of gait and mobility: Secondary | ICD-10-CM | POA: Diagnosis not present

## 2013-08-29 DIAGNOSIS — M7020 Olecranon bursitis, unspecified elbow: Secondary | ICD-10-CM | POA: Diagnosis not present

## 2013-08-29 DIAGNOSIS — G609 Hereditary and idiopathic neuropathy, unspecified: Secondary | ICD-10-CM | POA: Diagnosis not present

## 2013-08-29 DIAGNOSIS — E119 Type 2 diabetes mellitus without complications: Secondary | ICD-10-CM | POA: Diagnosis not present

## 2013-08-29 DIAGNOSIS — I10 Essential (primary) hypertension: Secondary | ICD-10-CM | POA: Diagnosis not present

## 2013-08-29 DIAGNOSIS — R269 Unspecified abnormalities of gait and mobility: Secondary | ICD-10-CM | POA: Diagnosis not present

## 2013-08-29 DIAGNOSIS — Z5189 Encounter for other specified aftercare: Secondary | ICD-10-CM | POA: Diagnosis not present

## 2013-08-31 DIAGNOSIS — Z5189 Encounter for other specified aftercare: Secondary | ICD-10-CM | POA: Diagnosis not present

## 2013-08-31 DIAGNOSIS — R269 Unspecified abnormalities of gait and mobility: Secondary | ICD-10-CM | POA: Diagnosis not present

## 2013-08-31 DIAGNOSIS — E119 Type 2 diabetes mellitus without complications: Secondary | ICD-10-CM | POA: Diagnosis not present

## 2013-08-31 DIAGNOSIS — M7020 Olecranon bursitis, unspecified elbow: Secondary | ICD-10-CM | POA: Diagnosis not present

## 2013-08-31 DIAGNOSIS — I10 Essential (primary) hypertension: Secondary | ICD-10-CM | POA: Diagnosis not present

## 2013-08-31 DIAGNOSIS — G609 Hereditary and idiopathic neuropathy, unspecified: Secondary | ICD-10-CM | POA: Diagnosis not present

## 2013-09-05 DIAGNOSIS — E119 Type 2 diabetes mellitus without complications: Secondary | ICD-10-CM | POA: Diagnosis not present

## 2013-09-05 DIAGNOSIS — I10 Essential (primary) hypertension: Secondary | ICD-10-CM | POA: Diagnosis not present

## 2013-09-05 DIAGNOSIS — G609 Hereditary and idiopathic neuropathy, unspecified: Secondary | ICD-10-CM | POA: Diagnosis not present

## 2013-09-05 DIAGNOSIS — R269 Unspecified abnormalities of gait and mobility: Secondary | ICD-10-CM | POA: Diagnosis not present

## 2013-09-05 DIAGNOSIS — M7020 Olecranon bursitis, unspecified elbow: Secondary | ICD-10-CM | POA: Diagnosis not present

## 2013-09-05 DIAGNOSIS — Z5189 Encounter for other specified aftercare: Secondary | ICD-10-CM | POA: Diagnosis not present

## 2013-09-06 DIAGNOSIS — Z85828 Personal history of other malignant neoplasm of skin: Secondary | ICD-10-CM | POA: Diagnosis not present

## 2013-09-15 DIAGNOSIS — I10 Essential (primary) hypertension: Secondary | ICD-10-CM | POA: Diagnosis not present

## 2013-09-15 DIAGNOSIS — R269 Unspecified abnormalities of gait and mobility: Secondary | ICD-10-CM | POA: Diagnosis not present

## 2013-09-15 DIAGNOSIS — Z6830 Body mass index (BMI) 30.0-30.9, adult: Secondary | ICD-10-CM | POA: Diagnosis not present

## 2013-09-15 DIAGNOSIS — E1129 Type 2 diabetes mellitus with other diabetic kidney complication: Secondary | ICD-10-CM | POA: Diagnosis not present

## 2013-09-15 DIAGNOSIS — M7020 Olecranon bursitis, unspecified elbow: Secondary | ICD-10-CM | POA: Diagnosis not present

## 2013-09-26 DIAGNOSIS — M7020 Olecranon bursitis, unspecified elbow: Secondary | ICD-10-CM | POA: Diagnosis not present

## 2013-09-26 DIAGNOSIS — E1129 Type 2 diabetes mellitus with other diabetic kidney complication: Secondary | ICD-10-CM | POA: Diagnosis not present

## 2013-09-26 DIAGNOSIS — Z6829 Body mass index (BMI) 29.0-29.9, adult: Secondary | ICD-10-CM | POA: Diagnosis not present

## 2013-09-26 DIAGNOSIS — I10 Essential (primary) hypertension: Secondary | ICD-10-CM | POA: Diagnosis not present

## 2013-11-10 DIAGNOSIS — Z23 Encounter for immunization: Secondary | ICD-10-CM | POA: Diagnosis not present

## 2013-11-29 DIAGNOSIS — Z961 Presence of intraocular lens: Secondary | ICD-10-CM | POA: Diagnosis not present

## 2013-11-29 DIAGNOSIS — H04121 Dry eye syndrome of right lacrimal gland: Secondary | ICD-10-CM | POA: Diagnosis not present

## 2013-11-29 DIAGNOSIS — H04122 Dry eye syndrome of left lacrimal gland: Secondary | ICD-10-CM | POA: Diagnosis not present

## 2013-11-29 DIAGNOSIS — H3531 Nonexudative age-related macular degeneration: Secondary | ICD-10-CM | POA: Diagnosis not present

## 2013-12-26 DIAGNOSIS — E1129 Type 2 diabetes mellitus with other diabetic kidney complication: Secondary | ICD-10-CM | POA: Diagnosis not present

## 2013-12-26 DIAGNOSIS — E785 Hyperlipidemia, unspecified: Secondary | ICD-10-CM | POA: Diagnosis not present

## 2013-12-26 DIAGNOSIS — I10 Essential (primary) hypertension: Secondary | ICD-10-CM | POA: Diagnosis not present

## 2013-12-26 DIAGNOSIS — Z6830 Body mass index (BMI) 30.0-30.9, adult: Secondary | ICD-10-CM | POA: Diagnosis not present

## 2013-12-26 DIAGNOSIS — E669 Obesity, unspecified: Secondary | ICD-10-CM | POA: Diagnosis not present

## 2013-12-26 DIAGNOSIS — Z1389 Encounter for screening for other disorder: Secondary | ICD-10-CM | POA: Diagnosis not present

## 2013-12-26 DIAGNOSIS — N182 Chronic kidney disease, stage 2 (mild): Secondary | ICD-10-CM | POA: Diagnosis not present

## 2014-02-09 DIAGNOSIS — E785 Hyperlipidemia, unspecified: Secondary | ICD-10-CM | POA: Diagnosis not present

## 2014-02-09 DIAGNOSIS — I10 Essential (primary) hypertension: Secondary | ICD-10-CM | POA: Diagnosis not present

## 2014-02-09 DIAGNOSIS — R8299 Other abnormal findings in urine: Secondary | ICD-10-CM | POA: Diagnosis not present

## 2014-02-09 DIAGNOSIS — E119 Type 2 diabetes mellitus without complications: Secondary | ICD-10-CM | POA: Diagnosis not present

## 2014-02-09 DIAGNOSIS — R8290 Unspecified abnormal findings in urine: Secondary | ICD-10-CM | POA: Diagnosis not present

## 2014-02-15 DIAGNOSIS — I10 Essential (primary) hypertension: Secondary | ICD-10-CM | POA: Diagnosis not present

## 2014-02-15 DIAGNOSIS — E1129 Type 2 diabetes mellitus with other diabetic kidney complication: Secondary | ICD-10-CM | POA: Diagnosis not present

## 2014-02-15 DIAGNOSIS — E785 Hyperlipidemia, unspecified: Secondary | ICD-10-CM | POA: Diagnosis not present

## 2014-02-15 DIAGNOSIS — N182 Chronic kidney disease, stage 2 (mild): Secondary | ICD-10-CM | POA: Diagnosis not present

## 2014-02-15 DIAGNOSIS — E669 Obesity, unspecified: Secondary | ICD-10-CM | POA: Diagnosis not present

## 2014-02-15 DIAGNOSIS — Z6830 Body mass index (BMI) 30.0-30.9, adult: Secondary | ICD-10-CM | POA: Diagnosis not present

## 2014-02-15 DIAGNOSIS — Z23 Encounter for immunization: Secondary | ICD-10-CM | POA: Diagnosis not present

## 2014-02-15 DIAGNOSIS — Z0000 Encounter for general adult medical examination without abnormal findings: Secondary | ICD-10-CM | POA: Diagnosis not present

## 2014-02-15 DIAGNOSIS — E213 Hyperparathyroidism, unspecified: Secondary | ICD-10-CM | POA: Diagnosis not present

## 2014-02-15 DIAGNOSIS — E119 Type 2 diabetes mellitus without complications: Secondary | ICD-10-CM | POA: Diagnosis not present

## 2014-02-15 DIAGNOSIS — Z1389 Encounter for screening for other disorder: Secondary | ICD-10-CM | POA: Diagnosis not present

## 2014-02-21 DIAGNOSIS — Z1212 Encounter for screening for malignant neoplasm of rectum: Secondary | ICD-10-CM | POA: Diagnosis not present

## 2014-04-11 DIAGNOSIS — Z6829 Body mass index (BMI) 29.0-29.9, adult: Secondary | ICD-10-CM | POA: Diagnosis not present

## 2014-04-11 DIAGNOSIS — R05 Cough: Secondary | ICD-10-CM | POA: Diagnosis not present

## 2014-04-11 DIAGNOSIS — J209 Acute bronchitis, unspecified: Secondary | ICD-10-CM | POA: Diagnosis not present

## 2014-06-16 DIAGNOSIS — E785 Hyperlipidemia, unspecified: Secondary | ICD-10-CM | POA: Diagnosis not present

## 2014-06-16 DIAGNOSIS — Z6830 Body mass index (BMI) 30.0-30.9, adult: Secondary | ICD-10-CM | POA: Diagnosis not present

## 2014-06-16 DIAGNOSIS — N182 Chronic kidney disease, stage 2 (mild): Secondary | ICD-10-CM | POA: Diagnosis not present

## 2014-06-16 DIAGNOSIS — M1990 Unspecified osteoarthritis, unspecified site: Secondary | ICD-10-CM | POA: Diagnosis not present

## 2014-06-16 DIAGNOSIS — E119 Type 2 diabetes mellitus without complications: Secondary | ICD-10-CM | POA: Diagnosis not present

## 2014-06-16 DIAGNOSIS — I10 Essential (primary) hypertension: Secondary | ICD-10-CM | POA: Diagnosis not present

## 2014-06-16 DIAGNOSIS — E1129 Type 2 diabetes mellitus with other diabetic kidney complication: Secondary | ICD-10-CM | POA: Diagnosis not present

## 2014-06-23 ENCOUNTER — Emergency Department (HOSPITAL_COMMUNITY): Payer: Medicare Other

## 2014-06-23 ENCOUNTER — Inpatient Hospital Stay (HOSPITAL_COMMUNITY)
Admission: EM | Admit: 2014-06-23 | Discharge: 2014-06-26 | DRG: 392 | Disposition: A | Discharge status: Home / Self Care | Payer: Medicare Other | Attending: Internal Medicine | Admitting: Internal Medicine

## 2014-06-23 ENCOUNTER — Encounter (HOSPITAL_COMMUNITY): Payer: Self-pay | Admitting: Emergency Medicine

## 2014-06-23 DIAGNOSIS — Z9049 Acquired absence of other specified parts of digestive tract: Secondary | ICD-10-CM | POA: Diagnosis present

## 2014-06-23 DIAGNOSIS — R911 Solitary pulmonary nodule: Secondary | ICD-10-CM | POA: Diagnosis not present

## 2014-06-23 DIAGNOSIS — R112 Nausea with vomiting, unspecified: Secondary | ICD-10-CM | POA: Diagnosis not present

## 2014-06-23 DIAGNOSIS — E119 Type 2 diabetes mellitus without complications: Secondary | ICD-10-CM | POA: Diagnosis present

## 2014-06-23 DIAGNOSIS — E118 Type 2 diabetes mellitus with unspecified complications: Secondary | ICD-10-CM

## 2014-06-23 DIAGNOSIS — Z7982 Long term (current) use of aspirin: Secondary | ICD-10-CM

## 2014-06-23 DIAGNOSIS — Z9841 Cataract extraction status, right eye: Secondary | ICD-10-CM

## 2014-06-23 DIAGNOSIS — Z66 Do not resuscitate: Secondary | ICD-10-CM | POA: Diagnosis present

## 2014-06-23 DIAGNOSIS — A084 Viral intestinal infection, unspecified: Secondary | ICD-10-CM | POA: Diagnosis not present

## 2014-06-23 DIAGNOSIS — R1110 Vomiting, unspecified: Secondary | ICD-10-CM

## 2014-06-23 DIAGNOSIS — Z87891 Personal history of nicotine dependence: Secondary | ICD-10-CM

## 2014-06-23 DIAGNOSIS — Z9842 Cataract extraction status, left eye: Secondary | ICD-10-CM

## 2014-06-23 DIAGNOSIS — E860 Dehydration: Secondary | ICD-10-CM | POA: Diagnosis present

## 2014-06-23 DIAGNOSIS — E785 Hyperlipidemia, unspecified: Secondary | ICD-10-CM | POA: Diagnosis present

## 2014-06-23 DIAGNOSIS — M1990 Unspecified osteoarthritis, unspecified site: Secondary | ICD-10-CM | POA: Diagnosis present

## 2014-06-23 DIAGNOSIS — I10 Essential (primary) hypertension: Secondary | ICD-10-CM | POA: Diagnosis not present

## 2014-06-23 DIAGNOSIS — Z882 Allergy status to sulfonamides status: Secondary | ICD-10-CM

## 2014-06-23 DIAGNOSIS — E1169 Type 2 diabetes mellitus with other specified complication: Secondary | ICD-10-CM | POA: Diagnosis present

## 2014-06-23 DIAGNOSIS — Z961 Presence of intraocular lens: Secondary | ICD-10-CM | POA: Diagnosis present

## 2014-06-23 DIAGNOSIS — K838 Other specified diseases of biliary tract: Secondary | ICD-10-CM | POA: Diagnosis not present

## 2014-06-23 DIAGNOSIS — K2970 Gastritis, unspecified, without bleeding: Secondary | ICD-10-CM | POA: Diagnosis present

## 2014-06-23 DIAGNOSIS — Z885 Allergy status to narcotic agent status: Secondary | ICD-10-CM

## 2014-06-23 DIAGNOSIS — M109 Gout, unspecified: Secondary | ICD-10-CM | POA: Diagnosis present

## 2014-06-23 DIAGNOSIS — R197 Diarrhea, unspecified: Secondary | ICD-10-CM | POA: Diagnosis not present

## 2014-06-23 DIAGNOSIS — Z794 Long term (current) use of insulin: Secondary | ICD-10-CM

## 2014-06-23 LAB — COMPREHENSIVE METABOLIC PANEL
ALK PHOS: 59 U/L (ref 38–126)
ALT: 43 U/L (ref 14–54)
AST: 42 U/L — AB (ref 15–41)
Albumin: 4.2 g/dL (ref 3.5–5.0)
Anion gap: 13 (ref 5–15)
BUN: 15 mg/dL (ref 6–20)
CO2: 23 mmol/L (ref 22–32)
Calcium: 9.1 mg/dL (ref 8.9–10.3)
Chloride: 104 mmol/L (ref 101–111)
Creatinine, Ser: 0.76 mg/dL (ref 0.44–1.00)
GFR calc non Af Amer: >60 mL/min (ref >60)
Glucose, Bld: 159 mg/dL — ABNORMAL HIGH (ref 65–99)
POTASSIUM: 3.8 mmol/L (ref 3.5–5.1)
Sodium: 140 mmol/L (ref 135–145)
Total Bilirubin: 0.4 mg/dL (ref 0.3–1.2)
Total Protein: 7.7 g/dL (ref 6.5–8.1)

## 2014-06-23 LAB — CBC WITH DIFFERENTIAL/PLATELET
BASOS ABS: 0 10*3/uL (ref 0.0–0.1)
BASOS PCT: 1 % (ref 0–1)
Eosinophils Absolute: 0.1 10*3/uL (ref 0.0–0.7)
Eosinophils Relative: 2 % (ref 0–5)
HCT: 42.5 % (ref 36.0–46.0)
HEMOGLOBIN: 14.8 g/dL (ref 12.0–15.0)
Lymphocytes Relative: 34 % (ref 12–46)
Lymphs Abs: 2.3 10*3/uL (ref 0.7–4.0)
MCH: 32.7 pg (ref 26.0–34.0)
MCHC: 34.8 g/dL (ref 30.0–36.0)
MCV: 93.8 fL (ref 78.0–100.0)
MONOS PCT: 8 % (ref 3–12)
Monocytes Absolute: 0.5 10*3/uL (ref 0.1–1.0)
NEUTROS ABS: 3.7 10*3/uL (ref 1.7–7.7)
Neutrophils Relative %: 55 % (ref 43–77)
PLATELETS: 206 10*3/uL (ref 150–400)
RBC: 4.53 MIL/uL (ref 3.87–5.11)
RDW: 12.3 % (ref 11.5–15.5)
WBC: 6.6 10*3/uL (ref 4.0–10.5)

## 2014-06-23 LAB — URINALYSIS, ROUTINE W REFLEX MICROSCOPIC
Bilirubin Urine: NEGATIVE
Glucose, UA: NEGATIVE mg/dL
Hgb urine dipstick: NEGATIVE
Ketones, ur: NEGATIVE mg/dL
Leukocytes, UA: NEGATIVE
NITRITE: NEGATIVE
PROTEIN: NEGATIVE mg/dL
SPECIFIC GRAVITY, URINE: 1.006 (ref 1.005–1.030)
Urobilinogen, UA: 0.2 mg/dL (ref 0.0–1.0)
pH: 6 (ref 5.0–8.0)

## 2014-06-23 LAB — GLUCOSE, CAPILLARY: Glucose-Capillary: 81 mg/dL (ref 65–99)

## 2014-06-23 LAB — I-STAT CG4 LACTIC ACID, ED
LACTIC ACID, VENOUS: 2.23 mmol/L — AB (ref 0.5–2.0)
LACTIC ACID, VENOUS: 1.2 mmol/L (ref 0.5–2.0)

## 2014-06-23 LAB — MAGNESIUM: Magnesium: 1.6 mg/dL — ABNORMAL LOW (ref 1.7–2.4)

## 2014-06-23 LAB — LIPASE, BLOOD: Lipase: 25 U/L (ref 22–51)

## 2014-06-23 LAB — PHOSPHORUS: PHOSPHORUS: 2.6 mg/dL (ref 2.5–4.6)

## 2014-06-23 MED ORDER — ONDANSETRON HCL 4 MG PO TABS
4.0000 mg | ORAL_TABLET | Freq: Four times a day (QID) | ORAL | Status: DC | PRN
  Administered 2014-06-24 – 2014-06-25 (×3): 4 mg via ORAL
  Filled 2014-06-23 (×3): qty 1

## 2014-06-23 MED ORDER — HEPARIN SODIUM (PORCINE) 5000 UNIT/ML IJ SOLN
5000.0000 [IU] | Freq: Three times a day (TID) | INTRAMUSCULAR | Status: DC
  Administered 2014-06-23 – 2014-06-26 (×8): 5000 [IU] via SUBCUTANEOUS
  Filled 2014-06-23 (×6): qty 1

## 2014-06-23 MED ORDER — KCL IN DEXTROSE-NACL 20-5-0.45 MEQ/L-%-% IV SOLN
INTRAVENOUS | Status: DC
  Administered 2014-06-23 – 2014-06-25 (×4): via INTRAVENOUS
  Filled 2014-06-23 (×5): qty 1000

## 2014-06-23 MED ORDER — ASPIRIN 81 MG PO CHEW
81.0000 mg | CHEWABLE_TABLET | Freq: Every day | ORAL | Status: DC
  Administered 2014-06-24 – 2014-06-26 (×3): 81 mg via ORAL
  Filled 2014-06-23 (×3): qty 1

## 2014-06-23 MED ORDER — SODIUM CHLORIDE 0.9 % IV SOLN
INTRAVENOUS | Status: DC
  Administered 2014-06-23: 15:00:00 via INTRAVENOUS

## 2014-06-23 MED ORDER — ONDANSETRON HCL 4 MG/2ML IJ SOLN
4.0000 mg | Freq: Once | INTRAMUSCULAR | Status: AC
  Administered 2014-06-23: 4 mg via INTRAVENOUS
  Filled 2014-06-23: qty 2

## 2014-06-23 MED ORDER — AMLODIPINE BESYLATE 10 MG PO TABS
10.0000 mg | ORAL_TABLET | Freq: Every day | ORAL | Status: DC
  Administered 2014-06-24 – 2014-06-26 (×3): 10 mg via ORAL
  Filled 2014-06-23 (×3): qty 1

## 2014-06-23 MED ORDER — SODIUM CHLORIDE 0.9 % IV BOLUS (SEPSIS)
500.0000 mL | Freq: Once | INTRAVENOUS | Status: AC
  Administered 2014-06-23: 500 mL via INTRAVENOUS

## 2014-06-23 MED ORDER — PRAVASTATIN SODIUM 10 MG PO TABS
20.0000 mg | ORAL_TABLET | Freq: Every day | ORAL | Status: DC
  Administered 2014-06-24 – 2014-06-26 (×3): 20 mg via ORAL
  Filled 2014-06-23 (×4): qty 2

## 2014-06-23 MED ORDER — IOHEXOL 300 MG/ML  SOLN
100.0000 mL | Freq: Once | INTRAMUSCULAR | Status: AC | PRN
  Administered 2014-06-23: 100 mL via INTRAVENOUS

## 2014-06-23 MED ORDER — ONDANSETRON HCL 4 MG/2ML IJ SOLN
4.0000 mg | Freq: Four times a day (QID) | INTRAMUSCULAR | Status: DC | PRN
  Administered 2014-06-23 – 2014-06-25 (×2): 4 mg via INTRAVENOUS
  Filled 2014-06-23 (×2): qty 2

## 2014-06-23 MED ORDER — IOHEXOL 300 MG/ML  SOLN: 25.0000 mL | Freq: Once | INTRAMUSCULAR | Status: DC | PRN

## 2014-06-23 MED ORDER — METOPROLOL TARTRATE 25 MG PO TABS
25.0000 mg | ORAL_TABLET | Freq: Two times a day (BID) | ORAL | Status: DC
  Administered 2014-06-23 – 2014-06-26 (×6): 25 mg via ORAL
  Filled 2014-06-23 (×6): qty 1

## 2014-06-23 MED ORDER — INSULIN ASPART 100 UNIT/ML ~~LOC~~ SOLN
0.0000 [IU] | Freq: Three times a day (TID) | SUBCUTANEOUS | Status: DC
  Administered 2014-06-24: 2 [IU] via SUBCUTANEOUS
  Administered 2014-06-24 (×2): 3 [IU] via SUBCUTANEOUS
  Administered 2014-06-25: 2 [IU] via SUBCUTANEOUS
  Administered 2014-06-25 (×2): 3 [IU] via SUBCUTANEOUS
  Administered 2014-06-26: 2 [IU] via SUBCUTANEOUS
  Administered 2014-06-26: 3 [IU] via SUBCUTANEOUS

## 2014-06-23 NOTE — ED Notes (Signed)
Pt c/o N/V/D onset Monday. Pt has vomited x 2 today and had 2 episodes of diarrhea.

## 2014-06-23 NOTE — ED Notes (Signed)
Attempted report 

## 2014-06-23 NOTE — H&P (Signed)
[facility] History and Physical  Allison Duffy OQH:[id number] DOB: Sep 19, 1926 DOA: 06/23/2014  Referring physician: Dr. Audie Pinto PCP: Geoffery Lyons, MD   Chief Complaint: Nausea, vomiting, diarrhea  HPI: Allison Duffy is a 81 y.o. female  With history of hypertension, hyperlipidemia, and diabetes mellitus. Who presents to the hospital complaining of one-day complaint of nausea vomiting and diarrhea. She reports that she was around sick contacts and has had fevers and chills. Denies any hematemesis. She did say that her emesis was a green color and ER physician subsequently obtained CT scan which did not report any gut obstruction.  The patient does report dizziness with standing. Initially had elevated lactic acid which improved after IV fluid hydration. We were consulted for further medical evaluation recommendations   Review of Systems:  Constitutional:  No weight loss, night sweats, Fevers, + chills, fatigue.  HEENT:  No headaches, Difficulty swallowing,Tooth/dental problems,Sore throat,  No sneezing, itching, ear ache, nasal congestion, post nasal drip,  Cardio-vascular:  No chest pain,  PND, swelling in lower extremities, anasarca, dizziness, palpitations  GI:  No heartburn, indigestion, abdominal pain, nausea, vomiting, diarrhea, change in bowel habits, loss of appetite  Resp:  No shortness of breath with exertion or at rest. No excess mucus, no productive cough, No non-productive cough, No coughing up of blood.No change in color of mucus.No wheezing.No chest wall deformity  Skin:  no rash or lesions.  GU:  no dysuria, change in color of urine, no urgency or frequency. No flank pain.  Musculoskeletal:  No joint pain or swelling. No decreased range of motion. No back pain.  Psych:  No change in mood or affect. No depression or anxiety. No memory loss.   Past Medical History  Diagnosis Date  . Hypertension   . Hyperlipidemia   . Chest pain   . PONV  (postoperative nausea and vomiting)   . Type II diabetes mellitus   . Arthritis     "scattered joints" (07/29/2013)  . Gout attack ?5465'K   Past Surgical History  Procedure Laterality Date  . Olecranon bursectomy Left 07/29/2013    "in ER"  . Tonsillectomy    . Appendectomy    . Cholecystectomy    . Carpal tunnel release Right   . Knee arthroscopy Right   . Cataract extraction w/ intraocular lens  implant, bilateral Bilateral   . Vaginal hysterectomy    . Parathyroidectomy     Social History:  reports that she quit smoking about 61 years ago. Her smoking use included Cigarettes. She quit after 5 years of use. She has never used smokeless tobacco. She reports that she does not drink alcohol or use illicit drugs.  Allergies  Allergen Reactions  . Codeine Nausea And Vomiting  . Hydrocodone     Nausea and vomiting  . Sulfa Antibiotics Itching and Nausea Only    Family History  Problem Relation Age of Onset  . Heart disease Mother   . Diabetes Mother   . Hypertension Mother   . Hypertension Father   . Heart disease Father   . Heart disease Brother     both brothers  . Diabetes Brother   . Hypertension Brother     Prior to Admission medications   Medication Sig Start Date End Date Taking? Authorizing Provider  acetaminophen (TYLENOL) 325 MG tablet Take 650 mg by mouth every 6 (six) hours as needed for pain.   Yes Historical Provider, MD  amLODipine (NORVASC) 10 MG tablet Take 10 mg by  mouth daily.   Yes Historical Provider, MD  aspirin 81 MG chewable tablet Chew 81 mg by mouth daily.   Yes Historical Provider, MD  CALCIUM PO Take 1 tablet by mouth daily.    Yes Historical Provider, MD  furosemide (LASIX) 40 MG tablet Take 40 mg by mouth.   Yes Historical Provider, MD  glipiZIDE (GLUCOTROL XL) 10 MG 24 hr tablet Take 10 mg by mouth 2 (two) times daily.    Yes Historical Provider, MD  lisinopril (PRINIVIL,ZESTRIL) 40 MG tablet Take 40 mg by mouth daily.   Yes Historical  Provider, MD  metFORMIN (GLUCOPHAGE) 500 MG tablet Take 500 mg by mouth daily with breakfast.    Yes Historical Provider, MD  metoprolol tartrate (LOPRESSOR) 25 MG tablet Take 25 mg by mouth 2 (two) times daily.   Yes Historical Provider, MD  Multiple Vitamin (MULTIVITAMIN WITH MINERALS) TABS Take 1 tablet by mouth 2 (two) times daily. Vitality Multivitamin & mineral   Yes Historical Provider, MD  pravastatin (PRAVACHOL) 20 MG tablet Take 20 mg by mouth daily.   Yes Historical Provider, MD  Propylene Glycol (SYSTANE BALANCE OP) Apply 1 drop to eye 2 (two) times daily as needed (dry eyes). For dry eyes as needed   Yes Historical Provider, MD  sitaGLIPtin (JANUVIA) 100 MG tablet Take 100 mg by mouth at bedtime.   Yes Historical Provider, MD  doxycycline (VIBRA-TABS) 100 MG tablet Take 1 tablet (100 mg total) by mouth 2 (two) times daily. Patient not taking: Reported on 06/23/2014 08/02/13   Burnard Bunting, MD  enoxaparin (LOVENOX) 40 MG/0.4ML injection Inject 0.4 mLs (40 mg total) into the skin daily. Patient not taking: Reported on 06/23/2014 08/02/13   Burnard Bunting, MD  insulin aspart (NOVOLOG) 100 UNIT/ML injection Inject 0-9 Units into the skin 3 (three) times daily with meals. Patient not taking: Reported on 06/23/2014 08/02/13   Burnard Bunting, MD  isosorbide mononitrate (IMDUR) 30 MG 24 hr tablet Take 0.5 tablets (15 mg total) by mouth daily. Patient not taking: Reported on 06/23/2014 07/05/13   Lorretta Harp, MD  traMADol (ULTRAM) 50 MG tablet Take 1 tablet (50 mg total) by mouth every 6 (six) hours as needed for moderate pain or severe pain. Patient not taking: Reported on 06/23/2014 08/02/13   Burnard Bunting, MD   Physical Exam: Filed Vitals:   06/23/14 1300 06/23/14 1440 06/23/14 1530 06/23/14 1600  BP: 161/80 155/61 150/75 138/78  Pulse: 71 70 71 71  Temp:      Resp:  16    Height:      Weight:      SpO2: 93% 99% 94% 91%    Wt Readings from Last 3 Encounters:  06/23/14 86.183 kg  (190 lb)  08/02/13 92.897 kg (204 lb 12.8 oz)  07/29/13 87.998 kg (194 lb)    General:  Appears calm and comfortable Eyes: PERRL, normal lids, irises & conjunctiva ENT: grossly normal hearing, lips & tongue, dry mucous membranes Neck: no LAD, masses or thyromegaly Cardiovascular: RRR, no m/r/g.  Respiratory: CTA bilaterally, no w/r/r. Normal respiratory effort. Abdomen: soft, nt,nd, negative Murphy sign Skin: no rash or induration seen on limited exam Musculoskeletal: grossly normal tone BUE/BLE Psychiatric: grossly normal mood and affect, speech fluent and appropriate Neurologic: grossly non-focal.          Labs on Admission:  Basic Metabolic Panel:  Recent Labs Lab 06/23/14 1138  NA 140  K 3.8  CL 104  CO2 23  GLUCOSE 159*  BUN 15  CREATININE 0.76  CALCIUM 9.1   Liver Function Tests:  Recent Labs Lab 06/23/14 1138  AST 42*  ALT 43  ALKPHOS 59  BILITOT 0.4  PROT 7.7  ALBUMIN 4.2    Recent Labs Lab 06/23/14 1138  LIPASE 25   No results for input(s): AMMONIA in the last 168 hours. CBC:  Recent Labs Lab 06/23/14 1138  WBC 6.6  NEUTROABS 3.7  HGB 14.8  HCT 42.5  MCV 93.8  PLT 206   Cardiac Enzymes: No results for input(s): CKTOTAL, CKMB, CKMBINDEX, TROPONINI in the last 168 hours.  BNP (last 3 results) No results for input(s): BNP in the last 8760 hours.  ProBNP (last 3 results) No results for input(s): PROBNP in the last 8760 hours.  CBG: No results for input(s): GLUCAP in the last 168 hours.  Radiological Exams on Admission: Ct Abdomen Pelvis W Contrast  06/23/2014   CLINICAL DATA:  81 yr old female that has been having nausea, vomiting, and diarrhea x 5 days. Today she has vomited in ER. Tenderness to RLQ. Hx: HTN, DM Surg. Hx: GB, Hysterectomy, appyInstructed to stop Metformin 48 hours.  EXAM: CT ABDOMEN AND PELVIS WITH CONTRAST  TECHNIQUE: Multidetector CT imaging of the abdomen and pelvis was performed using the standard protocol  following bolus administration of intravenous contrast.  CONTRAST:  132mL OMNIPAQUE IOHEXOL 300 MG/ML  SOLN  COMPARISON:  None.  FINDINGS: Lung bases: 11 mm nodule at the base of the right lower lobe. Smaller nodular opacity lies just lateral to this. Mild lung base reticular scarring and/ or subsegmental atelectasis. Heart is mildly enlarged.  Liver: There is no mass or focal lesion. There is intrahepatic bile duct dilation.  Gallbladder: Surgically absent. There is dilation of common bile duct with distal tapering. There is mild dilation of the distal pancreatic duct. No evidence of a duct stone or pancreatic mass.  Spleen: Multiple calcifications consistent with healed granulomatous disease. No other abnormality.  Pancreas: No mass or inflammation.  Adrenal glands: No masses.  Kidneys, ureters and bladder: No renal masses or stones. No hydronephrosis. Ureters normal course and in caliber. Bladder is unremarkable.  Uterus and ovaries:  Uterus surgically absent.  No adnexal mass.  Lymph nodes:  No pathologically enlarged lymph nodes.  Ascites:  None  Bowel: Scattered colonic diverticula mostly along the left colon. No colon wall thickening or mesenteric inflammation. No bowel dilation seen to suggest ileus or obstruction. Small bowel is unremarkable. Stomach is unremarkable. Short portion of the appendix is seen, normal in size.  Musculoskeletal: Bones are demineralized. There are degenerative changes of the spine most evident at L4-L5. No osteoblastic or osteolytic lesions.  IMPRESSION: 1. No acute findings. No findings to explain this patient's symptoms. 2. The short portion of a normal-sized appendix is seen. This may be an appendiceal stump. 3. Intra and extrahepatic bile duct dilation, presumed chronic. Status post cholecystectomy and hysterectomy. 4. 11 mm nodule in the right lower lobe. The chronicity of this is unclear. Given the size, biopsy or follow-up PET-CT is suggested.   Electronically Signed   By:  Lajean Manes M.D.   On: 06/23/2014 14:03     Assessment/Plan  Viral gastroenteritis -I suspect this is most likely cause of principal problem - Reportedly patient was around sick contacts - GI pathogen panel - Continue supportive therapy and we'll place on maintenance IV fluids - Place on clear liquid diet   Active Problems:   Essential hypertension - We'll hold  furosemide and lisinopril - Continue metoprolol    Hyperlipidemia - No chest pain reported continue aspirin - Continue statin    Type 2 diabetes mellitus - We'll hold oral hypoglycemic agents - Place on sliding scale insulin    Pulmonary nodule - We'll discuss with family to see if this is a known problem. If not patient will require further workup most likely as an outpatient should patient wish to pursue further workup.   Code Status: DNR DVT Prophylaxis: Heparin Family Communication: discussed with daughter at bedside Disposition Plan: Pending improvement in condition. Observation. With improvement in condition may consider d/c in 1-2 weeks  Time spent: > 55 minutes  Velvet Bathe [facility] Pager [phone]

## 2014-06-23 NOTE — ED Provider Notes (Signed)
CSN: [id number]     Arrival date & time 06/23/14  1045 History   First MD Initiated Contact with Patient 06/23/14 1102     Chief Complaint  Patient presents with  . Nausea  . Emesis  . Diarrhea     HPI  Patient presents with nausea vomiting and some diarrhea.  Diarrhea ended on Monday.  Vomiting and nausea discontinued.  Patient states that the vomitus smells like feces.  Denies any blood.  Past   surgical history includes cholecystectomy appendectomy and hysterectomy. Past Medical History  Diagnosis Date  . Hypertension   . Hyperlipidemia   . Chest pain   . PONV (postoperative nausea and vomiting)   . Type II diabetes mellitus   . Arthritis     "scattered joints" (07/29/2013)  . Gout attack ?6144'R   Past Surgical History  Procedure Laterality Date  . Olecranon bursectomy Left 07/29/2013    "in ER"  . Tonsillectomy    . Appendectomy    . Cholecystectomy    . Carpal tunnel release Right   . Knee arthroscopy Right   . Cataract extraction w/ intraocular lens  implant, bilateral Bilateral   . Vaginal hysterectomy    . Parathyroidectomy     Family History  Problem Relation Age of Onset  . Heart disease Mother   . Diabetes Mother   . Hypertension Mother   . Hypertension Father   . Heart disease Father   . Heart disease Brother     both brothers  . Diabetes Brother   . Hypertension Brother    History  Substance Use Topics  . Smoking status: Former Smoker -- 5 years    Types: Cigarettes    Quit date: 07/05/1953  . Smokeless tobacco: Never Used  . Alcohol Use: No   OB History    No data available     Review of Systems  Constitutional: Negative for fever and unexpected weight change.  Gastrointestinal: Positive for nausea, vomiting and diarrhea. Negative for blood in stool.      Allergies  Codeine; Hydrocodone; and Sulfa antibiotics  Home Medications   Prior to Admission medications   Medication Sig Start Date End Date Taking? Authorizing Provider   acetaminophen (TYLENOL) 325 MG tablet Take 650 mg by mouth every 6 (six) hours as needed for pain.    Historical Provider, MD  amLODipine (NORVASC) 10 MG tablet Take 10 mg by mouth daily.    Historical Provider, MD  aspirin 81 MG chewable tablet Chew 81 mg by mouth daily.    Historical Provider, MD  doxycycline (VIBRA-TABS) 100 MG tablet Take 1 tablet (100 mg total) by mouth 2 (two) times daily. 08/02/13   Burnard Bunting, MD  enoxaparin (LOVENOX) 40 MG/0.4ML injection Inject 0.4 mLs (40 mg total) into the skin daily. 08/02/13   Burnard Bunting, MD  glipiZIDE (GLUCOTROL XL) 10 MG 24 hr tablet Take 10 mg by mouth 2 (two) times daily.     Historical Provider, MD  insulin aspart (NOVOLOG) 100 UNIT/ML injection Inject 0-9 Units into the skin 3 (three) times daily with meals. 08/02/13   Burnard Bunting, MD  isosorbide mononitrate (IMDUR) 30 MG 24 hr tablet Take 0.5 tablets (15 mg total) by mouth daily. 07/05/13   Lorretta Harp, MD  lisinopril (PRINIVIL,ZESTRIL) 40 MG tablet Take 40 mg by mouth daily.    Historical Provider, MD  metFORMIN (GLUCOPHAGE) 500 MG tablet Take 500 mg by mouth daily with breakfast.     Historical Provider, MD  metoprolol tartrate (LOPRESSOR) 25 MG tablet Take 25 mg by mouth 2 (two) times daily.    Historical Provider, MD  Multiple Vitamin (MULTIVITAMIN WITH MINERALS) TABS Take 1 tablet by mouth 2 (two) times daily. Vitality Multivitamin & mineral    Historical Provider, MD  Propylene Glycol (SYSTANE BALANCE OP) Apply 1 drop to eye 2 (two) times daily as needed (dry eyes). For dry eyes as needed    Historical Provider, MD  sitaGLIPtin (JANUVIA) 100 MG tablet Take 100 mg by mouth at bedtime.    Historical Provider, MD  traMADol (ULTRAM) 50 MG tablet Take 1 tablet (50 mg total) by mouth every 6 (six) hours as needed for moderate pain or severe pain. 08/02/13   Burnard Bunting, MD   BP 155/61 mmHg  Pulse 70  Temp(Src) 97.8 F (36.6 C)  Resp 16  Ht 5\' 6"  (1.676 m)  Wt 190 lb (86.183  kg)  BMI 30.68 kg/m2  SpO2 99% Physical Exam  Constitutional: She is oriented to person, place, and time. She appears well-developed and well-nourished. No distress.  HENT:  Head: Normocephalic and atraumatic.  Eyes: Pupils are equal, round, and reactive to light.  Neck: Normal range of motion.  Cardiovascular: Normal rate and intact distal pulses.   Pulmonary/Chest: No respiratory distress.  Abdominal: Normal appearance. She exhibits no distension. There is generalized tenderness. There is no rigidity, no rebound and no guarding.  Musculoskeletal: Normal range of motion.  Neurological: She is alert and oriented to person, place, and time. No cranial nerve deficit.  Skin: Skin is warm and dry. No rash noted.  Psychiatric: She has a normal mood and affect. Her behavior is normal.  Nursing note and vitals reviewed.   ED Course  Procedures (including critical care time) Medications  iohexol (OMNIPAQUE) 300 MG/ML solution 25 mL (not administered)  0.9 %  sodium chloride infusion ( Intravenous New Bag/Given 06/23/14 1508)  sodium chloride 0.9 % bolus 500 mL (0 mLs Intravenous Stopped 06/23/14 1230)  ondansetron (ZOFRAN) injection 4 mg (4 mg Intravenous Given 06/23/14 1144)  iohexol (OMNIPAQUE) 300 MG/ML solution 100 mL (100 mLs Intravenous Contrast Given 06/23/14 1333)    Labs Review Labs Reviewed  COMPREHENSIVE METABOLIC PANEL - Abnormal; Notable for the following:    Glucose, Bld 159 (*)    AST 42 (*)    All other components within normal limits  I-STAT CG4 LACTIC ACID, ED - Abnormal; Notable for the following:    Lactic Acid, Venous 2.23 (*)    All other components within normal limits  CBC WITH DIFFERENTIAL/PLATELET  LIPASE, BLOOD  URINALYSIS, ROUTINE W REFLEX MICROSCOPIC (NOT AT [facility])  I-STAT CG4 LACTIC ACID, ED  I-STAT CG4 LACTIC ACID, ED    Imaging Review Ct Abdomen Pelvis W Contrast  06/23/2014   CLINICAL DATA:  81 yr old female that has been having nausea, vomiting, and  diarrhea x 5 days. Today she has vomited in ER. Tenderness to RLQ. Hx: HTN, DM Surg. Hx: GB, Hysterectomy, appyInstructed to stop Metformin 48 hours.  EXAM: CT ABDOMEN AND PELVIS WITH CONTRAST  TECHNIQUE: Multidetector CT imaging of the abdomen and pelvis was performed using the standard protocol following bolus administration of intravenous contrast.  CONTRAST:  134mL OMNIPAQUE IOHEXOL 300 MG/ML  SOLN  COMPARISON:  None.  FINDINGS: Lung bases: 11 mm nodule at the base of the right lower lobe. Smaller nodular opacity lies just lateral to this. Mild lung base reticular scarring and/ or subsegmental atelectasis. Heart is mildly enlarged.  Liver: There is no mass or focal lesion. There is intrahepatic bile duct dilation.  Gallbladder: Surgically absent. There is dilation of common bile duct with distal tapering. There is mild dilation of the distal pancreatic duct. No evidence of a duct stone or pancreatic mass.  Spleen: Multiple calcifications consistent with healed granulomatous disease. No other abnormality.  Pancreas: No mass or inflammation.  Adrenal glands: No masses.  Kidneys, ureters and bladder: No renal masses or stones. No hydronephrosis. Ureters normal course and in caliber. Bladder is unremarkable.  Uterus and ovaries:  Uterus surgically absent.  No adnexal mass.  Lymph nodes:  No pathologically enlarged lymph nodes.  Ascites:  None  Bowel: Scattered colonic diverticula mostly along the left colon. No colon wall thickening or mesenteric inflammation. No bowel dilation seen to suggest ileus or obstruction. Small bowel is unremarkable. Stomach is unremarkable. Short portion of the appendix is seen, normal in size.  Musculoskeletal: Bones are demineralized. There are degenerative changes of the spine most evident at L4-L5. No osteoblastic or osteolytic lesions.  IMPRESSION: 1. No acute findings. No findings to explain this patient's symptoms. 2. The short portion of a normal-sized appendix is seen. This may  be an appendiceal stump. 3. Intra and extrahepatic bile duct dilation, presumed chronic. Status post cholecystectomy and hysterectomy. 4. 11 mm nodule in the right lower lobe. The chronicity of this is unclear. Given the size, biopsy or follow-up PET-CT is suggested.   Electronically Signed   By: Lajean Manes M.D.   On: 06/23/2014 14:03     Patient was given IV fluids and Zofran.  The hospitalist was contacted and will come to evaluate the patient. MDM   Final diagnoses:  Vomiting        Leonard Schwartz, MD 07/05/14 1312

## 2014-06-24 DIAGNOSIS — K2970 Gastritis, unspecified, without bleeding: Secondary | ICD-10-CM | POA: Diagnosis not present

## 2014-06-24 DIAGNOSIS — Z882 Allergy status to sulfonamides status: Secondary | ICD-10-CM | POA: Diagnosis not present

## 2014-06-24 DIAGNOSIS — I10 Essential (primary) hypertension: Secondary | ICD-10-CM | POA: Diagnosis not present

## 2014-06-24 DIAGNOSIS — Z9842 Cataract extraction status, left eye: Secondary | ICD-10-CM | POA: Diagnosis not present

## 2014-06-24 DIAGNOSIS — Z66 Do not resuscitate: Secondary | ICD-10-CM | POA: Diagnosis present

## 2014-06-24 DIAGNOSIS — E119 Type 2 diabetes mellitus without complications: Secondary | ICD-10-CM

## 2014-06-24 DIAGNOSIS — Z9049 Acquired absence of other specified parts of digestive tract: Secondary | ICD-10-CM | POA: Diagnosis present

## 2014-06-24 DIAGNOSIS — M109 Gout, unspecified: Secondary | ICD-10-CM | POA: Diagnosis present

## 2014-06-24 DIAGNOSIS — Z87891 Personal history of nicotine dependence: Secondary | ICD-10-CM | POA: Diagnosis not present

## 2014-06-24 DIAGNOSIS — Z7982 Long term (current) use of aspirin: Secondary | ICD-10-CM | POA: Diagnosis not present

## 2014-06-24 DIAGNOSIS — A084 Viral intestinal infection, unspecified: Principal | ICD-10-CM

## 2014-06-24 DIAGNOSIS — Z885 Allergy status to narcotic agent status: Secondary | ICD-10-CM | POA: Diagnosis not present

## 2014-06-24 DIAGNOSIS — E785 Hyperlipidemia, unspecified: Secondary | ICD-10-CM | POA: Diagnosis not present

## 2014-06-24 DIAGNOSIS — R112 Nausea with vomiting, unspecified: Secondary | ICD-10-CM | POA: Diagnosis not present

## 2014-06-24 DIAGNOSIS — Z9841 Cataract extraction status, right eye: Secondary | ICD-10-CM | POA: Diagnosis not present

## 2014-06-24 DIAGNOSIS — M1990 Unspecified osteoarthritis, unspecified site: Secondary | ICD-10-CM | POA: Diagnosis present

## 2014-06-24 DIAGNOSIS — Z961 Presence of intraocular lens: Secondary | ICD-10-CM | POA: Diagnosis present

## 2014-06-24 DIAGNOSIS — Z794 Long term (current) use of insulin: Secondary | ICD-10-CM | POA: Diagnosis not present

## 2014-06-24 DIAGNOSIS — E860 Dehydration: Secondary | ICD-10-CM | POA: Diagnosis present

## 2014-06-24 DIAGNOSIS — R911 Solitary pulmonary nodule: Secondary | ICD-10-CM | POA: Diagnosis not present

## 2014-06-24 LAB — BASIC METABOLIC PANEL
ANION GAP: 7 (ref 5–15)
BUN: 9 mg/dL (ref 6–20)
CO2: 26 mmol/L (ref 22–32)
CREATININE: 0.76 mg/dL (ref 0.44–1.00)
Calcium: 8.1 mg/dL — ABNORMAL LOW (ref 8.9–10.3)
Chloride: 107 mmol/L (ref 101–111)
GFR calc Af Amer: >60 mL/min (ref >60)
GFR calc non Af Amer: >60 mL/min (ref >60)
GLUCOSE: 147 mg/dL — AB (ref 65–99)
POTASSIUM: 3.4 mmol/L — AB (ref 3.5–5.1)
Sodium: 140 mmol/L (ref 135–145)

## 2014-06-24 LAB — GLUCOSE, CAPILLARY
GLUCOSE-CAPILLARY: 165 mg/dL — AB (ref 65–99)
GLUCOSE-CAPILLARY: 134 mg/dL — AB (ref 65–99)
GLUCOSE-CAPILLARY: 140 mg/dL — AB (ref 65–99)
Glucose-Capillary: 185 mg/dL — ABNORMAL HIGH (ref 65–99)

## 2014-06-24 LAB — CBC
HCT: 36.4 % (ref 36.0–46.0)
Hemoglobin: 12.5 g/dL (ref 12.0–15.0)
MCH: 32.4 pg (ref 26.0–34.0)
MCHC: 34.3 g/dL (ref 30.0–36.0)
MCV: 94.3 fL (ref 78.0–100.0)
Platelets: 186 10*3/uL (ref 150–400)
RBC: 3.86 MIL/uL — ABNORMAL LOW (ref 3.87–5.11)
RDW: 12.4 % (ref 11.5–15.5)
WBC: 5.8 10*3/uL (ref 4.0–10.5)

## 2014-06-24 LAB — CLOSTRIDIUM DIFFICILE BY PCR: CDIFFPCR: NEGATIVE

## 2014-06-24 MED ORDER — POTASSIUM CHLORIDE CRYS ER 20 MEQ PO TBCR
40.0000 meq | EXTENDED_RELEASE_TABLET | Freq: Once | ORAL | Status: AC
  Administered 2014-06-24: 40 meq via ORAL
  Filled 2014-06-24: qty 2

## 2014-06-24 MED ORDER — LOPERAMIDE HCL 1 MG/5ML PO LIQD
2.0000 mg | ORAL | Status: DC | PRN
  Administered 2014-06-25: 2 mg via ORAL
  Filled 2014-06-24 (×3): qty 10

## 2014-06-24 NOTE — Evaluation (Signed)
Physical Therapy Evaluation Patient Details Name: Allison Duffy MRN: [id number] DOB: Mar 04, 1926 Today's Date: 06/24/2014   History of Present Illness  Patient is an 81 yo female admitted 06/23/14 with nausea, vomiting, diarrhea.  Patient with viral gastroenteritis.  PMH:  HTN, HLD, DM  Clinical Impression  Patient presents with problems listed below.  Will benefit from acute PT to maximize functional independence prior to discharge.  Patient was very independent prior to admission.  Anticipate she will progress well with PT.     Follow Up Recommendations Home health PT;Supervision - Intermittent    Equipment Recommendations  None recommended by PT    Recommendations for Other Services       Precautions / Restrictions Precautions Precautions: Fall Restrictions Weight Bearing Restrictions: No      Mobility  Bed Mobility               General bed mobility comments: Patient in chair as PT entered room  Transfers Overall transfer level: Needs assistance Equipment used: Rolling walker (2 wheeled) Transfers: Sit to/from Stand Sit to Stand: Min guard         General transfer comment: Verbal cues for hand placement.  Assist for safety only.  Ambulation/Gait Ambulation/Gait assistance: Min guard Ambulation Distance (Feet): 60 Feet Assistive device: Rolling walker (2 wheeled) Gait Pattern/deviations: Step-through pattern;Decreased stride length;Trunk flexed Gait velocity: Decreased Gait velocity interpretation: Below normal speed for age/gender General Gait Details: Verbal cues for safe use of RW.  Patient with slow gait, with flexed posture.  Cues to stand upright.  Stairs            Wheelchair Mobility    Modified Rankin (Stroke Patients Only)       Balance                                             Pertinent Vitals/Pain Pain Assessment: 0-10 Pain Score: 2  Pain Location: abdomen Pain Descriptors / Indicators: Cramping Pain  Intervention(s): Monitored during session;Repositioned    Home Living Family/patient expects to be discharged to:: Private residence Living Arrangements: Alone Available Help at Discharge: Family;Available PRN/intermittently Type of Home: House Home Access: Stairs to enter Entrance Stairs-Rails: Right Entrance Stairs-Number of Steps: 3 Home Layout: One level Home Equipment: Cane - single point      Prior Function Level of Independence: Independent with assistive device(s)         Comments: Uses cane for ambulation     Hand Dominance        Extremity/Trunk Assessment   Upper Extremity Assessment: Generalized weakness;RUE deficits/detail RUE Deficits / Details: Decreased shoulder ROM - < 90* flexion         Lower Extremity Assessment: Generalized weakness (History of neuropathy bilaterally)         Communication   Communication: No difficulties  Cognition Arousal/Alertness: Awake/alert Behavior During Therapy: WFL for tasks assessed/performed Overall Cognitive Status: Within Functional Limits for tasks assessed                      General Comments      Exercises        Assessment/Plan    PT Assessment Patient needs continued PT services  PT Diagnosis Difficulty walking;Generalized weakness;Acute pain   PT Problem List Decreased strength;Decreased activity tolerance;Decreased balance;Decreased mobility;Decreased knowledge of use of DME;Impaired sensation;Pain  PT Treatment Interventions DME  instruction;Gait training;Functional mobility training;Therapeutic activities;Patient/family education   PT Goals (Current goals can be found in the Care Plan section) Acute Rehab PT Goals Patient Stated Goal: To return home PT Goal Formulation: With patient Time For Goal Achievement: 07/01/14 Potential to Achieve Goals: Good    Frequency Min 3X/week   Barriers to discharge Decreased caregiver support Patient lives alone    Co-evaluation                End of Session Equipment Utilized During Treatment: Gait belt Activity Tolerance: Patient tolerated treatment well;Patient limited by fatigue Patient left: in chair;with call bell/phone within reach;with family/visitor present Nurse Communication: Mobility status         Time: 1204-1219 PT Time Calculation (min) (ACUTE ONLY): 15 min   Charges:   PT Evaluation $Initial PT Evaluation Tier I: 1 Procedure     PT G CodesDespina Pole 07/14/14, 9:02 PM Carita Pian. Sanjuana Kava, [place] Pager [phone]

## 2014-06-24 NOTE — Progress Notes (Addendum)
[facility] PROGRESS NOTE  Allison Duffy YBW:[id number] DOB: 15-Jul-1926 DOA: 06/23/2014 PCP: Geoffery Lyons, MD  Assessment/Plan: 1. Probable viral gastroenteritis -Patient presenting with nausea, vomiting, diarrhea, dehydration likely secondary to viral syndrome. She reports being in contact with members of her church having similar symptoms. -CT scan of abdomen and pelvis did not show acute changes/intra-abdominal pathology -She continued to have multiple episodes of nausea vomiting diarrhea overnight however this morning think she's feeling a little better -Will continue providing IV fluid resuscitation and supportive care -Repeat labs in morning  2.  Pulmonary nodule -CT scan of lungs showing an 11 mm nodule at the base of the right lower lobe. She does not have a significant smoking history.  -Radiology recommending biopsy or follow-up PET CT -This was discussed with patient and family who expressed wishes to consider further workup in the outpatient setting. She will follow-up with her primary care provider.   3.  Diabetes mellitus -Her Januvia was held due to decreased by mouth intake from nausea and vomiting. -Will continue Accu-Cheks with sliding scale coverage  4.  Hypertension. -Blood pressures are elevated with systolic blood pressures in the 160 range, will continue metoprolol 25 mg by mouth twice a day and Norvasc 10 mg by mouth daily, elevated blood pressures may be pain driven, will follow blood pressures over the course of the day  Code Status: DO NOT RESUSCITATE Family Communication: Spoke with her daughter was present at bedside Disposition Plan: Anticipate discharge on 4 hours if she continues to improve    HPI/Subjective: Patient is a pleasant 81 year old female with a past medical history of hypertension, dyslipidemia, diabetes mellitus, who was admitted to the medicine service on 06/23/2014 presenting with complaints of nausea vomiting and diarrhea.  She reported having sick contacts from members of her church who had similar symptoms. Initial workup included a CT scan of abdomen and pelvis that did not show acute findings. CT did reveal an 11 mm nodule in the right lower lobe. This was discussed with patient and her daughter who were aware of pulmonary nodule. Expressed wishes to pursue further workup in the outpatient setting.  Objective: Filed Vitals:   06/24/14 0549  BP: 169/63  Pulse: 68  Temp: 97.7 F (36.5 C)  Resp: 16    Intake/Output Summary (Last 24 hours) at 06/24/14 1338 Last data filed at 06/24/14 0600  Gross per 24 hour  Intake   1190 ml  Output      0 ml  Net   1190 ml   Filed Weights   06/23/14 1052 06/23/14 2036  Weight: 86.183 kg (190 lb) 83 kg (182 lb 15.7 oz)    Exam:   General:  Patient states feeling a little better this morning however continues to feel sick, she is awake and alert  Cardiovascular: Regular rate and rhythm normal S1-S2  Respiratory: Normal respiratory effort lungs are clear to auscultation bilaterally  Abdomen: Soft nontender nondistended  Musculoskeletal: No edema  Data Reviewed: Basic Metabolic Panel:  Recent Labs Lab 06/23/14 1138 06/23/14 2119 06/24/14 0352  NA 140  --  140  K 3.8  --  3.4*  CL 104  --  107  CO2 23  --  26  GLUCOSE 159*  --  147*  BUN 15  --  9  CREATININE 0.76  --  0.76  CALCIUM 9.1  --  8.1*  MG  --  1.6*  --   PHOS  --  2.6  --  Liver Function Tests:  Recent Labs Lab 06/23/14 1138  AST 42*  ALT 43  ALKPHOS 59  BILITOT 0.4  PROT 7.7  ALBUMIN 4.2    Recent Labs Lab 06/23/14 1138  LIPASE 25   No results for input(s): AMMONIA in the last 168 hours. CBC:  Recent Labs Lab 06/23/14 1138 06/24/14 0352  WBC 6.6 5.8  NEUTROABS 3.7  --   HGB 14.8 12.5  HCT 42.5 36.4  MCV 93.8 94.3  PLT 206 186   Cardiac Enzymes: No results for input(s): CKTOTAL, CKMB, CKMBINDEX, TROPONINI in the last 168 hours. BNP (last 3 results) No  results for input(s): BNP in the last 8760 hours.  ProBNP (last 3 results) No results for input(s): PROBNP in the last 8760 hours.  CBG:  Recent Labs Lab 06/23/14 2142 06/24/14 0756 06/24/14 1218  GLUCAP 81 185* 165*    Recent Results (from the past 240 hour(s))  Clostridium Difficile by PCR     Status: None   Collection Time: 06/23/14 11:52 PM  Result Value Ref Range Status   C difficile by pcr NEGATIVE NEGATIVE Final     Studies: Ct Abdomen Pelvis W Contrast  06/23/2014   CLINICAL DATA:  81 yr old female that has been having nausea, vomiting, and diarrhea x 5 days. Today she has vomited in ER. Tenderness to RLQ. Hx: HTN, DM Surg. Hx: GB, Hysterectomy, appyInstructed to stop Metformin 48 hours.  EXAM: CT ABDOMEN AND PELVIS WITH CONTRAST  TECHNIQUE: Multidetector CT imaging of the abdomen and pelvis was performed using the standard protocol following bolus administration of intravenous contrast.  CONTRAST:  1106mL OMNIPAQUE IOHEXOL 300 MG/ML  SOLN  COMPARISON:  None.  FINDINGS: Lung bases: 11 mm nodule at the base of the right lower lobe. Smaller nodular opacity lies just lateral to this. Mild lung base reticular scarring and/ or subsegmental atelectasis. Heart is mildly enlarged.  Liver: There is no mass or focal lesion. There is intrahepatic bile duct dilation.  Gallbladder: Surgically absent. There is dilation of common bile duct with distal tapering. There is mild dilation of the distal pancreatic duct. No evidence of a duct stone or pancreatic mass.  Spleen: Multiple calcifications consistent with healed granulomatous disease. No other abnormality.  Pancreas: No mass or inflammation.  Adrenal glands: No masses.  Kidneys, ureters and bladder: No renal masses or stones. No hydronephrosis. Ureters normal course and in caliber. Bladder is unremarkable.  Uterus and ovaries:  Uterus surgically absent.  No adnexal mass.  Lymph nodes:  No pathologically enlarged lymph nodes.  Ascites:  None   Bowel: Scattered colonic diverticula mostly along the left colon. No colon wall thickening or mesenteric inflammation. No bowel dilation seen to suggest ileus or obstruction. Small bowel is unremarkable. Stomach is unremarkable. Short portion of the appendix is seen, normal in size.  Musculoskeletal: Bones are demineralized. There are degenerative changes of the spine most evident at L4-L5. No osteoblastic or osteolytic lesions.  IMPRESSION: 1. No acute findings. No findings to explain this patient's symptoms. 2. The short portion of a normal-sized appendix is seen. This may be an appendiceal stump. 3. Intra and extrahepatic bile duct dilation, presumed chronic. Status post cholecystectomy and hysterectomy. 4. 11 mm nodule in the right lower lobe. The chronicity of this is unclear. Given the size, biopsy or follow-up PET-CT is suggested.   Electronically Signed   By: Lajean Manes M.D.   On: 06/23/2014 14:03    Scheduled Meds: . amLODipine  10 mg  Oral Daily  . aspirin  81 mg Oral Daily  . heparin  5,000 Units Subcutaneous 3 times per day  . insulin aspart  0-15 Units Subcutaneous TID WC  . metoprolol tartrate  25 mg Oral BID  . pravastatin  20 mg Oral Daily   Continuous Infusions: . dextrose 5 % and 0.45 % NaCl with KCl 20 mEq/L 100 mL/hr at 06/24/14 4854    Active Problems:   Essential hypertension   Hyperlipidemia   Type 2 diabetes mellitus   Pulmonary nodule   Viral gastroenteritis   Viral gastritis    Time spent: 55 min    Kelvin Cellar  [facility] Pager [phone]. If 7PM-7AM, please contact night-coverage at [email or web address], password [email or web address] 06/24/2014, 1:38 PM  LOS: 1 day

## 2014-06-25 DIAGNOSIS — K2970 Gastritis, unspecified, without bleeding: Secondary | ICD-10-CM

## 2014-06-25 DIAGNOSIS — E785 Hyperlipidemia, unspecified: Secondary | ICD-10-CM

## 2014-06-25 LAB — GLUCOSE, CAPILLARY
GLUCOSE-CAPILLARY: 179 mg/dL — AB (ref 65–99)
GLUCOSE-CAPILLARY: 130 mg/dL — AB (ref 65–99)
Glucose-Capillary: 178 mg/dL — ABNORMAL HIGH (ref 65–99)
Glucose-Capillary: 92 mg/dL (ref 65–99)

## 2014-06-25 LAB — CBC
HCT: 36.9 % (ref 36.0–46.0)
Hemoglobin: 12.5 g/dL (ref 12.0–15.0)
MCH: 32.4 pg (ref 26.0–34.0)
MCHC: 33.9 g/dL (ref 30.0–36.0)
MCV: 95.6 fL (ref 78.0–100.0)
Platelets: 180 10*3/uL (ref 150–400)
RBC: 3.86 MIL/uL — ABNORMAL LOW (ref 3.87–5.11)
RDW: 12.4 % (ref 11.5–15.5)
WBC: 5.6 10*3/uL (ref 4.0–10.5)

## 2014-06-25 LAB — BASIC METABOLIC PANEL
Anion gap: 6 (ref 5–15)
BUN: <5 mg/dL — ABNORMAL LOW (ref 6–20)
CALCIUM: 7.9 mg/dL — AB (ref 8.9–10.3)
CO2: 22 mmol/L (ref 22–32)
Chloride: 112 mmol/L — ABNORMAL HIGH (ref 101–111)
Creatinine, Ser: 0.69 mg/dL (ref 0.44–1.00)
GFR calc Af Amer: >60 mL/min (ref >60)
GFR calc non Af Amer: >60 mL/min (ref >60)
Glucose, Bld: 161 mg/dL — ABNORMAL HIGH (ref 65–99)
Potassium: 3.8 mmol/L (ref 3.5–5.1)
Sodium: 140 mmol/L (ref 135–145)

## 2014-06-25 MED ORDER — ISOSORBIDE MONONITRATE ER 30 MG PO TB24
15.0000 mg | ORAL_TABLET | Freq: Every day | ORAL | Status: DC
  Administered 2014-06-25 – 2014-06-26 (×2): 15 mg via ORAL
  Filled 2014-06-25 (×2): qty 1

## 2014-06-25 MED ORDER — SODIUM CHLORIDE 0.9 % IV SOLN
INTRAVENOUS | Status: DC
  Administered 2014-06-25: 14:00:00 via INTRAVENOUS

## 2014-06-25 MED ORDER — ACETAMINOPHEN 325 MG PO TABS
650.0000 mg | ORAL_TABLET | Freq: Four times a day (QID) | ORAL | Status: DC | PRN
  Administered 2014-06-25 – 2014-06-26 (×3): 650 mg via ORAL
  Filled 2014-06-25 (×2): qty 2

## 2014-06-25 NOTE — Progress Notes (Signed)
[facility] PROGRESS NOTE  Allison Duffy [id number]:[id number] DOB: [birth year] DOA: 06/23/2014 PCP: Geoffery Lyons, MD  Assessment/Plan: 1. Probable viral gastroenteritis -Patient presenting with nausea, vomiting, diarrhea, dehydration likely secondary to viral syndrome. She reports being in contact with members of her church having similar symptoms. -CT scan of abdomen and pelvis did not show acute changes/intra-abdominal pathology -She reports having nausea although has not vomited. Diarrhea slowing down as well -Will advance diet to full liquids today   2.  Pulmonary nodule -CT scan of lungs showing an 11 mm nodule at the base of the right lower lobe. She does not have a significant smoking history.  -Radiology recommending biopsy or follow-up PET CT -This was discussed with patient and family who expressed wishes to consider further workup in the outpatient setting. She will follow-up with her primary care provider.   3.  Diabetes mellitus -Her Januvia was held due to decreased by mouth intake from nausea and vomiting. -Continue Accu-Cheks with sliding scale coverage, place her back on her diabetic regime once tolerating PO   4.  Hypertension. -Blood pressures are elevated with systolic blood pressures in the 160's range. HR's remain in the 60's range thus will keep metoprolol at the present dose. Continue Norvasc at 10 mg PO q daily.  -Will add Imdur 15 mg PO q daily  Code Status: DO NOT RESUSCITATE Family Communication: Spoke with her daughter was present at bedside Disposition Plan: Anticipate discharge in the next 24 -48 hours if she continues to improve    HPI/Subjective: Patient is a pleasant 81 year old female with a past medical history of hypertension, dyslipidemia, diabetes mellitus, who was admitted to the medicine service on 06/23/2014 presenting with complaints of nausea vomiting and diarrhea. She reported having sick contacts from members of her church who had  similar symptoms. Initial workup included a CT scan of abdomen and pelvis that did not show acute findings. CT did reveal an 11 mm nodule in the right lower lobe. This was discussed with patient and her daughter who were aware of pulmonary nodule. Expressed wishes to pursue further workup in the outpatient setting.  Objective: Filed Vitals:   06/25/14 0600  BP: 147/67  Pulse: 65  Temp: 97.7 F (36.5 C)  Resp: 18    Intake/Output Summary (Last 24 hours) at 06/25/14 1133 Last data filed at 06/25/14 0825  Gross per 24 hour  Intake    980 ml  Output      0 ml  Net    980 ml   Filed Weights   06/23/14 1052 06/23/14 2036  Weight: 86.183 kg (190 lb) 83 kg (182 lb 15.7 oz)    Exam:   General:  She was ambulated down the hall way today, states having nausea  Cardiovascular: Regular rate and rhythm normal S1-S2  Respiratory: Normal respiratory effort lungs are clear to auscultation bilaterally  Abdomen: Soft nontender nondistended  Musculoskeletal: No edema  Data Reviewed: Basic Metabolic Panel:  Recent Labs Lab 06/23/14 1138 06/23/14 2119 06/24/14 0352 06/25/14 0412  NA 140  --  140 140  K 3.8  --  3.4* 3.8  CL 104  --  107 112*  CO2 23  --  26 22  GLUCOSE 159*  --  147* 161*  BUN 15  --  9 <5*  CREATININE 0.76  --  0.76 0.69  CALCIUM 9.1  --  8.1* 7.9*  MG  --  1.6*  --   --   PHOS  --  2.6  --   --    Liver Function Tests:  Recent Labs Lab 06/23/14 1138  AST 42*  ALT 43  ALKPHOS 59  BILITOT 0.4  PROT 7.7  ALBUMIN 4.2    Recent Labs Lab 06/23/14 1138  LIPASE 25   No results for input(s): AMMONIA in the last 168 hours. CBC:  Recent Labs Lab 06/23/14 1138 06/24/14 0352 06/25/14 0412  WBC 6.6 5.8 5.6  NEUTROABS 3.7  --   --   HGB 14.8 12.5 12.5  HCT 42.5 36.4 36.9  MCV 93.8 94.3 95.6  PLT 206 186 180   Cardiac Enzymes: No results for input(s): CKTOTAL, CKMB, CKMBINDEX, TROPONINI in the last 168 hours. BNP (last 3 results) No results  for input(s): BNP in the last 8760 hours.  ProBNP (last 3 results) No results for input(s): PROBNP in the last 8760 hours.  CBG:  Recent Labs Lab 06/24/14 0756 06/24/14 1218 06/24/14 1725 06/24/14 2156 06/25/14 0819  GLUCAP 185* 165* 134* 140* 179*    Recent Results (from the past 240 hour(s))  Clostridium Difficile by PCR     Status: None   Collection Time: 06/23/14 11:52 PM  Result Value Ref Range Status   C difficile by pcr NEGATIVE NEGATIVE Final     Studies: Ct Abdomen Pelvis W Contrast  06/23/2014   CLINICAL DATA:  81 yr old female that has been having nausea, vomiting, and diarrhea x 5 days. Today she has vomited in ER. Tenderness to RLQ. Hx: HTN, DM Surg. Hx: GB, Hysterectomy, appyInstructed to stop Metformin 48 hours.  EXAM: CT ABDOMEN AND PELVIS WITH CONTRAST  TECHNIQUE: Multidetector CT imaging of the abdomen and pelvis was performed using the standard protocol following bolus administration of intravenous contrast.  CONTRAST:  110mL OMNIPAQUE IOHEXOL 300 MG/ML  SOLN  COMPARISON:  None.  FINDINGS: Lung bases: 11 mm nodule at the base of the right lower lobe. Smaller nodular opacity lies just lateral to this. Mild lung base reticular scarring and/ or subsegmental atelectasis. Heart is mildly enlarged.  Liver: There is no mass or focal lesion. There is intrahepatic bile duct dilation.  Gallbladder: Surgically absent. There is dilation of common bile duct with distal tapering. There is mild dilation of the distal pancreatic duct. No evidence of a duct stone or pancreatic mass.  Spleen: Multiple calcifications consistent with healed granulomatous disease. No other abnormality.  Pancreas: No mass or inflammation.  Adrenal glands: No masses.  Kidneys, ureters and bladder: No renal masses or stones. No hydronephrosis. Ureters normal course and in caliber. Bladder is unremarkable.  Uterus and ovaries:  Uterus surgically absent.  No adnexal mass.  Lymph nodes:  No pathologically enlarged  lymph nodes.  Ascites:  None  Bowel: Scattered colonic diverticula mostly along the left colon. No colon wall thickening or mesenteric inflammation. No bowel dilation seen to suggest ileus or obstruction. Small bowel is unremarkable. Stomach is unremarkable. Short portion of the appendix is seen, normal in size.  Musculoskeletal: Bones are demineralized. There are degenerative changes of the spine most evident at L4-L5. No osteoblastic or osteolytic lesions.  IMPRESSION: 1. No acute findings. No findings to explain this patient's symptoms. 2. The short portion of a normal-sized appendix is seen. This may be an appendiceal stump. 3. Intra and extrahepatic bile duct dilation, presumed chronic. Status post cholecystectomy and hysterectomy. 4. 11 mm nodule in the right lower lobe. The chronicity of this is unclear. Given the size, biopsy or follow-up PET-CT is suggested.  Electronically Signed   By: Lajean Manes M.D.   On: 06/23/2014 14:03    Scheduled Meds: . amLODipine  10 mg Oral Daily  . aspirin  81 mg Oral Daily  . heparin  5,000 Units Subcutaneous 3 times per day  . insulin aspart  0-15 Units Subcutaneous TID WC  . isosorbide mononitrate  15 mg Oral Daily  . metoprolol tartrate  25 mg Oral BID  . pravastatin  20 mg Oral Daily   Continuous Infusions: . sodium chloride      Active Problems:   Essential hypertension   Hyperlipidemia   Type 2 diabetes mellitus   Pulmonary nodule   Viral gastroenteritis   Viral gastritis    Time spent: 25 min    Kelvin Cellar  [facility] Pager [phone]. If 7PM-7AM, please contact night-coverage at [email or web address], password [facility] 06/25/2014, 11:33 AM  LOS: 2 days

## 2014-06-26 LAB — GLUCOSE, CAPILLARY
GLUCOSE-CAPILLARY: 148 mg/dL — AB (ref 65–99)
Glucose-Capillary: 152 mg/dL — ABNORMAL HIGH (ref 65–99)

## 2014-06-26 MED ORDER — MAGNESIUM SULFATE IN D5W 10-5 MG/ML-% IV SOLN
1.0000 g | Freq: Once | INTRAVENOUS | Status: AC
  Administered 2014-06-26: 1 g via INTRAVENOUS
  Filled 2014-06-26: qty 100

## 2014-06-26 NOTE — Discharge Summary (Signed)
Physician Discharge Summary  Allison Duffy TFT:[id number] DOB: 26-Jul-1926 DOA: 06/23/2014  PCP: Geoffery Lyons, MD  Admit date: 06/23/2014 Discharge date: 06/26/2014  Time spent: 35 minutes  Recommendations for Outpatient Follow-up:   1. Please follow up on her blood sugars, Januvia was stopped at discharge as I was concerned for hypoglycemia. Blood sugars ranged between 92 and 140. She was discharged on Metformin and Glypizide   2. Please follow up on blood pressures, she had a blood pressure of 118/53 on morning of discharge. Lisinopril was discontinued on discharge 3. Please follow up on Pulmonary Nodule. Radiology recommending PET CT 4. Home Health Services were set up for patient prior to discharge for PT  Discharge Diagnoses:  Active Problems:   Essential hypertension   Hyperlipidemia   Type 2 diabetes mellitus   Pulmonary nodule   Viral gastroenteritis   Viral gastritis   Discharge Condition: Stable  Diet recommendation: Carb Mod Diet  Filed Weights   06/23/14 1052 06/23/14 2036  Weight: 86.183 kg (190 lb) 83 kg (182 lb 15.7 oz)    History of present illness:  Allison Duffy is a 81 y.o. female  With history of hypertension, hyperlipidemia, and diabetes mellitus. Who presents to the hospital complaining of one-day complaint of nausea vomiting and diarrhea. She reports that she was around sick contacts and has had fevers and chills. Denies any hematemesis. She did say that her emesis was a green color and ER physician subsequently obtained CT scan which did not report any gut obstruction.  The patient does report dizziness with standing. Initially had elevated lactic acid which improved after IV fluid hydration. We were consulted for further medical evaluation recommendations  Hospital Course:  Patient is a pleasant 81 year old female with a past medical history of hypertension, dyslipidemia, diabetes mellitus, who was admitted to the medicine service on  06/23/2014 presenting with complaints of nausea vomiting and diarrhea. She reported having sick contacts from members of her church who had similar symptoms. Initial workup included a CT scan of abdomen and pelvis that did not show acute findings. CT did reveal an 11 mm nodule in the right lower lobe. This was discussed with patient and her daughter who were aware of pulmonary nodule. Expressed wishes to pursue further workup in the outpatient setting.  1. Probable viral gastroenteritis -Patient presenting with nausea, vomiting, diarrhea, dehydration likely secondary to viral syndrome. She reports being in contact with members of her church having similar symptoms. -CT scan of abdomen and pelvis did not show acute changes/intra-abdominal pathology -Patient having clinical improvement, by 06/26/2014 was tolerating regular diet  2. Pulmonary nodule -CT scan of lungs showing an 11 mm nodule at the base of the right lower lobe. She does not have a significant smoking history.  -Radiology recommending biopsy or follow-up PET CT -This was discussed with patient and family who expressed wishes to consider further workup in the outpatient setting. She will follow-up with her primary care provider.   3. Diabetes mellitus -Will discharge on Metformin and Glypizide, will hold Januvia for now given concerns for precipitating hypoglycemia -Please follow up on her blood sugars.   4. Hypertension. -Will discharge on Metoprolol, Imdur and Norvasc, lisinopril was discontinued on discharge given low blood pressures, had a BP of  118/53 on morning of discharge.    Discharge Exam: Filed Vitals:   06/26/14 0522  BP: 118/53  Pulse: 61  Temp: 98.4 F (36.9 C)  Resp: 18    General: Patient was ambulated down  the hallway with her walker, looks better Cardiovascular: RRR normal S1S2, no edema to extremities Respiratory: Normal Inspiratory effort Abdomen: Soft, nontender nondistended  Discharge  Instructions   Discharge Instructions    Call MD for:  difficulty breathing, headache or visual disturbances    Complete by:  As directed      Call MD for:  extreme fatigue    Complete by:  As directed      Call MD for:  hives    Complete by:  As directed      Call MD for:  persistant dizziness or light-headedness    Complete by:  As directed      Call MD for:  persistant nausea and vomiting    Complete by:  As directed      Call MD for:  redness, tenderness, or signs of infection (pain, swelling, redness, odor or green/yellow discharge around incision site)    Complete by:  As directed      Call MD for:  severe uncontrolled pain    Complete by:  As directed      Call MD for:  temperature >100.4    Complete by:  As directed      Diet - low sodium heart healthy    Complete by:  As directed      Increase activity slowly    Complete by:  As directed           Current Discharge Medication List    CONTINUE these medications which have NOT CHANGED   Details  acetaminophen (TYLENOL) 325 MG tablet Take 650 mg by mouth every 6 (six) hours as needed for pain.    amLODipine (NORVASC) 10 MG tablet Take 10 mg by mouth daily.    aspirin 81 MG chewable tablet Chew 81 mg by mouth daily.    CALCIUM PO Take 1 tablet by mouth daily.     glipiZIDE (GLUCOTROL XL) 10 MG 24 hr tablet Take 10 mg by mouth 2 (two) times daily.     metFORMIN (GLUCOPHAGE) 500 MG tablet Take 500 mg by mouth daily with breakfast.     metoprolol tartrate (LOPRESSOR) 25 MG tablet Take 25 mg by mouth 2 (two) times daily.    Multiple Vitamin (MULTIVITAMIN WITH MINERALS) TABS Take 1 tablet by mouth 2 (two) times daily. Vitality Multivitamin & mineral    pravastatin (PRAVACHOL) 20 MG tablet Take 20 mg by mouth daily.    Propylene Glycol (SYSTANE BALANCE OP) Apply 1 drop to eye 2 (two) times daily as needed (dry eyes). For dry eyes as needed    insulin aspart (NOVOLOG) 100 UNIT/ML injection Inject 0-9 Units into the  skin 3 (three) times daily with meals. Qty: 10 mL, Refills: 11    isosorbide mononitrate (IMDUR) 30 MG 24 hr tablet Take 0.5 tablets (15 mg total) by mouth daily. Qty: 15 tablet, Refills: 6    traMADol (ULTRAM) 50 MG tablet Take 1 tablet (50 mg total) by mouth every 6 (six) hours as needed for moderate pain or severe pain. Qty: 30 tablet, Refills: 3      STOP taking these medications     furosemide (LASIX) 40 MG tablet      lisinopril (PRINIVIL,ZESTRIL) 40 MG tablet      sitaGLIPtin (JANUVIA) 100 MG tablet      doxycycline (VIBRA-TABS) 100 MG tablet      enoxaparin (LOVENOX) 40 MG/0.4ML injection        Allergies  Allergen Reactions  . Codeine Nausea  And Vomiting  . Hydrocodone     Nausea and vomiting  . Sulfa Antibiotics Itching and Nausea Only   Follow-up Information    Follow up with ARONSON,RICHARD A, MD In 1 week.   Specialty:  Internal Medicine   Contact information:   [address] [id number] [phone]        The results of significant diagnostics from this hospitalization (including imaging, microbiology, ancillary and laboratory) are listed below for reference.    Significant Diagnostic Studies: Ct Abdomen Pelvis W Contrast  06/23/2014   CLINICAL DATA:  81 yr old female that has been having nausea, vomiting, and diarrhea x 5 days. Today she has vomited in ER. Tenderness to RLQ. Hx: HTN, DM Surg. Hx: GB, Hysterectomy, appyInstructed to stop Metformin 48 hours.  EXAM: CT ABDOMEN AND PELVIS WITH CONTRAST  TECHNIQUE: Multidetector CT imaging of the abdomen and pelvis was performed using the standard protocol following bolus administration of intravenous contrast.  CONTRAST:  159mL OMNIPAQUE IOHEXOL 300 MG/ML  SOLN  COMPARISON:  None.  FINDINGS: Lung bases: 11 mm nodule at the base of the right lower lobe. Smaller nodular opacity lies just lateral to this. Mild lung base reticular scarring and/ or subsegmental atelectasis. Heart is mildly enlarged.   Liver: There is no mass or focal lesion. There is intrahepatic bile duct dilation.  Gallbladder: Surgically absent. There is dilation of common bile duct with distal tapering. There is mild dilation of the distal pancreatic duct. No evidence of a duct stone or pancreatic mass.  Spleen: Multiple calcifications consistent with healed granulomatous disease. No other abnormality.  Pancreas: No mass or inflammation.  Adrenal glands: No masses.  Kidneys, ureters and bladder: No renal masses or stones. No hydronephrosis. Ureters normal course and in caliber. Bladder is unremarkable.  Uterus and ovaries:  Uterus surgically absent.  No adnexal mass.  Lymph nodes:  No pathologically enlarged lymph nodes.  Ascites:  None  Bowel: Scattered colonic diverticula mostly along the left colon. No colon wall thickening or mesenteric inflammation. No bowel dilation seen to suggest ileus or obstruction. Small bowel is unremarkable. Stomach is unremarkable. Short portion of the appendix is seen, normal in size.  Musculoskeletal: Bones are demineralized. There are degenerative changes of the spine most evident at L4-L5. No osteoblastic or osteolytic lesions.  IMPRESSION: 1. No acute findings. No findings to explain this patient's symptoms. 2. The short portion of a normal-sized appendix is seen. This may be an appendiceal stump. 3. Intra and extrahepatic bile duct dilation, presumed chronic. Status post cholecystectomy and hysterectomy. 4. 11 mm nodule in the right lower lobe. The chronicity of this is unclear. Given the size, biopsy or follow-up PET-CT is suggested.   Electronically Signed   By: Lajean Manes M.D.   On: 06/23/2014 14:03    Microbiology: Recent Results (from the past 240 hour(s))  Clostridium Difficile by PCR     Status: None   Collection Time: 06/23/14 11:52 PM  Result Value Ref Range Status   C difficile by pcr NEGATIVE NEGATIVE Final     Labs: Basic Metabolic Panel:  Recent Labs Lab 06/23/14 1138  06/23/14 2119 06/24/14 0352 06/25/14 0412  NA 140  --  140 140  K 3.8  --  3.4* 3.8  CL 104  --  107 112*  CO2 23  --  26 22  GLUCOSE 159*  --  147* 161*  BUN 15  --  9 <5*  CREATININE 0.76  --  0.76  0.69  CALCIUM 9.1  --  8.1* 7.9*  MG  --  1.6*  --   --   PHOS  --  2.6  --   --    Liver Function Tests:  Recent Labs Lab 06/23/14 1138  AST 42*  ALT 43  ALKPHOS 59  BILITOT 0.4  PROT 7.7  ALBUMIN 4.2    Recent Labs Lab 06/23/14 1138  LIPASE 25   No results for input(s): AMMONIA in the last 168 hours. CBC:  Recent Labs Lab 06/23/14 1138 06/24/14 0352 06/25/14 0412  WBC 6.6 5.8 5.6  NEUTROABS 3.7  --   --   HGB 14.8 12.5 12.5  HCT 42.5 36.4 36.9  MCV 93.8 94.3 95.6  PLT 206 186 180   Cardiac Enzymes: No results for input(s): CKTOTAL, CKMB, CKMBINDEX, TROPONINI in the last 168 hours. BNP: BNP (last 3 results) No results for input(s): BNP in the last 8760 hours.  ProBNP (last 3 results) No results for input(s): PROBNP in the last 8760 hours.  CBG:  Recent Labs Lab 06/25/14 0819 06/25/14 1220 06/25/14 1713 06/25/14 2154 06/26/14 0815  GLUCAP 179* 130* 178* 92 148*       Signed:  Geri Hepler  [facility] 06/26/2014, 9:48 AM

## 2014-06-26 NOTE — Care Management Note (Signed)
Case Management Note  Patient Details  Name: Allison Duffy MRN: [id number] Date of Birth: 02-05-1926  Subjective/Objective:  Pt admitted on 06/23/14 with likely viral gastroenteritis.  PTA, pt resided at home and was independent.                   Action/Plan: Met with pt and daughter to discuss dc plans.  Pt needs HH follow up at dc.  Referral to [facility], per pt choice.  Start of care 24-48h post dc date.  RW to be delivered to pt room prior to dc.    Expected Discharge Date:    06/26/14              Expected Discharge Plan:  [place]  In-House Referral:     Discharge planning Services  CM Consult  Post Acute Care Choice:    Choice offered to:  Patient  DME Arranged:  Walker rolling DME Agency:  [place]:  PT [facility] Agency:  [place]  Status of Service:  Completed, signed off  Medicare Important Message Given:  Yes Date Medicare IM Given:  06/26/14 Medicare IM give by:  Ellan Lambert, RN, BSN  Date Additional Medicare IM Given:    Additional Medicare Important Message give by:     If discussed at [place] of Stay Meetings, dates discussed:    Additional Comments:  Reinaldo Raddle, RN, BSN  Trauma/Neuro ICU Case Manager [phone]

## 2014-06-26 NOTE — Progress Notes (Signed)
Physical Therapy Treatment Patient Details Name: Allison Duffy MRN: [id number] DOB: 12/02/1926 Today's Date: 07/02/2014    History of Present Illness Patient is an 81 yo female admitted 06/23/14 with nausea, vomiting, diarrhea.  Patient with viral gastroenteritis.  PMH:  HTN, HLD, DM    PT Comments    Patient doing well with mobility and gait.  Ready for d/c from PT perspective.  Follow Up Recommendations  Home health PT;Supervision - Intermittent     Equipment Recommendations  None recommended by PT    Recommendations for Other Services       Precautions / Restrictions Precautions Precautions: Fall Restrictions Weight Bearing Restrictions: No    Mobility  Bed Mobility               General bed mobility comments: Patient in chair as PT entered room  Transfers Overall transfer level: Modified independent Equipment used: Rolling walker (2 wheeled) Transfers: Sit to/from Stand Sit to Stand: Modified independent (Device/Increase time)         General transfer comment: Patient using good technique to move to standing.  Required cues for hand placement and to keep RW with her until safe to sit down.  Ambulation/Gait Ambulation/Gait assistance: Supervision Ambulation Distance (Feet): 140 Feet Assistive device: Rolling walker (2 wheeled) Gait Pattern/deviations: Step-through pattern;Decreased stride length Gait velocity: Decreased Gait velocity interpretation: Below normal speed for age/gender General Gait Details: Patient using safe technique with RW.  Good balance with RW.  Slow gait pattern.  Supervision for safety   Stairs            Wheelchair Mobility    Modified Rankin (Stroke Patients Only)       Balance                                    Cognition Arousal/Alertness: Awake/alert Behavior During Therapy: WFL for tasks assessed/performed Overall Cognitive Status: Within Functional Limits for tasks assessed                      Exercises      General Comments        Pertinent Vitals/Pain Pain Assessment: 0-10 Pain Score: 2  Pain Location: abdomen Pain Descriptors / Indicators:  ("Griping") Pain Intervention(s): Monitored during session    Home Living                      Prior Function            PT Goals (current goals can now be found in the care plan section) Progress towards PT goals: Progressing toward goals    Frequency  Min 3X/week    PT Plan Current plan remains appropriate    Co-evaluation             End of Session Equipment Utilized During Treatment: Gait belt Activity Tolerance: Patient tolerated treatment well Patient left: in chair;with call bell/phone within reach;with family/visitor present     Time: 1005-1017 PT Time Calculation (min) (ACUTE ONLY): 12 min  Charges:  $Gait Training: 8-22 mins                    G Codes:      Despina Pole 07-02-14, 10:23 AM Carita Pian. Sanjuana Kava, [place] Pager [phone]

## 2014-06-26 NOTE — Progress Notes (Signed)
Patient discharged to home with instructions. 

## 2014-06-27 DIAGNOSIS — R269 Unspecified abnormalities of gait and mobility: Secondary | ICD-10-CM | POA: Diagnosis not present

## 2014-06-27 DIAGNOSIS — E119 Type 2 diabetes mellitus without complications: Secondary | ICD-10-CM | POA: Diagnosis not present

## 2014-06-27 DIAGNOSIS — R911 Solitary pulmonary nodule: Secondary | ICD-10-CM | POA: Diagnosis not present

## 2014-06-27 DIAGNOSIS — I10 Essential (primary) hypertension: Secondary | ICD-10-CM | POA: Diagnosis not present

## 2014-06-27 DIAGNOSIS — E785 Hyperlipidemia, unspecified: Secondary | ICD-10-CM | POA: Diagnosis not present

## 2014-06-28 DIAGNOSIS — I10 Essential (primary) hypertension: Secondary | ICD-10-CM | POA: Diagnosis not present

## 2014-06-28 DIAGNOSIS — R911 Solitary pulmonary nodule: Secondary | ICD-10-CM | POA: Diagnosis not present

## 2014-06-28 DIAGNOSIS — R269 Unspecified abnormalities of gait and mobility: Secondary | ICD-10-CM | POA: Diagnosis not present

## 2014-06-28 DIAGNOSIS — E785 Hyperlipidemia, unspecified: Secondary | ICD-10-CM | POA: Diagnosis not present

## 2014-06-28 DIAGNOSIS — E119 Type 2 diabetes mellitus without complications: Secondary | ICD-10-CM | POA: Diagnosis not present

## 2014-06-28 LAB — GI PATHOGEN PANEL BY PCR, STOOL
C difficile toxin A/B: NOT DETECTED
Campylobacter by PCR: NOT DETECTED
Cryptosporidium by PCR: NOT DETECTED
E COLI (STEC): NOT DETECTED
E coli (ETEC) LT/ST: NOT DETECTED
E coli 0157 by PCR: NOT DETECTED
G LAMBLIA BY PCR: NOT DETECTED
Rotavirus A by PCR: NOT DETECTED
SALMONELLA BY PCR: NOT DETECTED
Shigella by PCR: NOT DETECTED

## 2014-06-29 DIAGNOSIS — I10 Essential (primary) hypertension: Secondary | ICD-10-CM | POA: Diagnosis not present

## 2014-06-29 DIAGNOSIS — E119 Type 2 diabetes mellitus without complications: Secondary | ICD-10-CM | POA: Diagnosis not present

## 2014-06-29 DIAGNOSIS — R911 Solitary pulmonary nodule: Secondary | ICD-10-CM | POA: Diagnosis not present

## 2014-06-29 DIAGNOSIS — E785 Hyperlipidemia, unspecified: Secondary | ICD-10-CM | POA: Diagnosis not present

## 2014-06-29 DIAGNOSIS — R269 Unspecified abnormalities of gait and mobility: Secondary | ICD-10-CM | POA: Diagnosis not present

## 2014-07-04 DIAGNOSIS — R269 Unspecified abnormalities of gait and mobility: Secondary | ICD-10-CM | POA: Diagnosis not present

## 2014-07-04 DIAGNOSIS — E119 Type 2 diabetes mellitus without complications: Secondary | ICD-10-CM | POA: Diagnosis not present

## 2014-07-04 DIAGNOSIS — I10 Essential (primary) hypertension: Secondary | ICD-10-CM | POA: Diagnosis not present

## 2014-07-04 DIAGNOSIS — E785 Hyperlipidemia, unspecified: Secondary | ICD-10-CM | POA: Diagnosis not present

## 2014-07-04 DIAGNOSIS — R911 Solitary pulmonary nodule: Secondary | ICD-10-CM | POA: Diagnosis not present

## 2014-07-25 DIAGNOSIS — I10 Essential (primary) hypertension: Secondary | ICD-10-CM | POA: Diagnosis not present

## 2014-07-25 DIAGNOSIS — E785 Hyperlipidemia, unspecified: Secondary | ICD-10-CM | POA: Diagnosis not present

## 2014-07-25 DIAGNOSIS — E669 Obesity, unspecified: Secondary | ICD-10-CM | POA: Diagnosis not present

## 2014-07-25 DIAGNOSIS — E1129 Type 2 diabetes mellitus with other diabetic kidney complication: Secondary | ICD-10-CM | POA: Diagnosis not present

## 2014-07-25 DIAGNOSIS — E213 Hyperparathyroidism, unspecified: Secondary | ICD-10-CM | POA: Diagnosis not present

## 2014-07-25 DIAGNOSIS — M1990 Unspecified osteoarthritis, unspecified site: Secondary | ICD-10-CM | POA: Diagnosis not present

## 2014-07-25 DIAGNOSIS — Z6828 Body mass index (BMI) 28.0-28.9, adult: Secondary | ICD-10-CM | POA: Diagnosis not present

## 2014-07-25 DIAGNOSIS — N182 Chronic kidney disease, stage 2 (mild): Secondary | ICD-10-CM | POA: Diagnosis not present

## 2014-08-03 DIAGNOSIS — Z1231 Encounter for screening mammogram for malignant neoplasm of breast: Secondary | ICD-10-CM | POA: Diagnosis not present

## 2014-09-19 DIAGNOSIS — M25511 Pain in right shoulder: Secondary | ICD-10-CM | POA: Diagnosis not present

## 2014-09-19 DIAGNOSIS — G8929 Other chronic pain: Secondary | ICD-10-CM | POA: Diagnosis not present

## 2014-10-10 DIAGNOSIS — M19011 Primary osteoarthritis, right shoulder: Secondary | ICD-10-CM | POA: Diagnosis not present

## 2014-10-16 DIAGNOSIS — M19011 Primary osteoarthritis, right shoulder: Secondary | ICD-10-CM | POA: Diagnosis not present

## 2014-10-16 DIAGNOSIS — Z87891 Personal history of nicotine dependence: Secondary | ICD-10-CM | POA: Diagnosis not present

## 2014-10-16 DIAGNOSIS — M1990 Unspecified osteoarthritis, unspecified site: Secondary | ICD-10-CM | POA: Diagnosis not present

## 2014-10-20 DIAGNOSIS — N182 Chronic kidney disease, stage 2 (mild): Secondary | ICD-10-CM | POA: Diagnosis not present

## 2014-10-20 DIAGNOSIS — I129 Hypertensive chronic kidney disease with stage 1 through stage 4 chronic kidney disease, or unspecified chronic kidney disease: Secondary | ICD-10-CM | POA: Diagnosis not present

## 2014-10-20 DIAGNOSIS — Z23 Encounter for immunization: Secondary | ICD-10-CM | POA: Diagnosis not present

## 2014-10-20 DIAGNOSIS — E669 Obesity, unspecified: Secondary | ICD-10-CM | POA: Diagnosis not present

## 2014-10-20 DIAGNOSIS — M1990 Unspecified osteoarthritis, unspecified site: Secondary | ICD-10-CM | POA: Diagnosis not present

## 2014-10-20 DIAGNOSIS — Z6830 Body mass index (BMI) 30.0-30.9, adult: Secondary | ICD-10-CM | POA: Diagnosis not present

## 2014-10-20 DIAGNOSIS — E785 Hyperlipidemia, unspecified: Secondary | ICD-10-CM | POA: Diagnosis not present

## 2014-10-20 DIAGNOSIS — E1129 Type 2 diabetes mellitus with other diabetic kidney complication: Secondary | ICD-10-CM | POA: Diagnosis not present

## 2014-10-20 DIAGNOSIS — E213 Hyperparathyroidism, unspecified: Secondary | ICD-10-CM | POA: Diagnosis not present

## 2014-10-20 DIAGNOSIS — I10 Essential (primary) hypertension: Secondary | ICD-10-CM | POA: Diagnosis not present

## 2014-10-24 DIAGNOSIS — M75101 Unspecified rotator cuff tear or rupture of right shoulder, not specified as traumatic: Secondary | ICD-10-CM | POA: Diagnosis not present

## 2014-11-06 DIAGNOSIS — Z87891 Personal history of nicotine dependence: Secondary | ICD-10-CM | POA: Diagnosis not present

## 2014-11-06 DIAGNOSIS — M19011 Primary osteoarthritis, right shoulder: Secondary | ICD-10-CM | POA: Diagnosis not present

## 2014-11-06 DIAGNOSIS — Z7982 Long term (current) use of aspirin: Secondary | ICD-10-CM | POA: Diagnosis not present

## 2014-11-27 DIAGNOSIS — I10 Essential (primary) hypertension: Secondary | ICD-10-CM | POA: Diagnosis not present

## 2014-11-27 DIAGNOSIS — I129 Hypertensive chronic kidney disease with stage 1 through stage 4 chronic kidney disease, or unspecified chronic kidney disease: Secondary | ICD-10-CM | POA: Diagnosis not present

## 2014-11-27 DIAGNOSIS — E1129 Type 2 diabetes mellitus with other diabetic kidney complication: Secondary | ICD-10-CM | POA: Diagnosis not present

## 2014-11-27 DIAGNOSIS — M19011 Primary osteoarthritis, right shoulder: Secondary | ICD-10-CM | POA: Diagnosis not present

## 2014-11-27 DIAGNOSIS — R42 Dizziness and giddiness: Secondary | ICD-10-CM | POA: Diagnosis not present

## 2014-11-30 DIAGNOSIS — M19011 Primary osteoarthritis, right shoulder: Secondary | ICD-10-CM | POA: Diagnosis not present

## 2014-11-30 DIAGNOSIS — I129 Hypertensive chronic kidney disease with stage 1 through stage 4 chronic kidney disease, or unspecified chronic kidney disease: Secondary | ICD-10-CM | POA: Diagnosis not present

## 2014-11-30 DIAGNOSIS — Z6830 Body mass index (BMI) 30.0-30.9, adult: Secondary | ICD-10-CM | POA: Diagnosis not present

## 2014-12-04 DIAGNOSIS — E119 Type 2 diabetes mellitus without complications: Secondary | ICD-10-CM | POA: Diagnosis not present

## 2014-12-04 DIAGNOSIS — E785 Hyperlipidemia, unspecified: Secondary | ICD-10-CM | POA: Diagnosis not present

## 2014-12-04 DIAGNOSIS — I10 Essential (primary) hypertension: Secondary | ICD-10-CM | POA: Diagnosis not present

## 2014-12-04 DIAGNOSIS — Z01812 Encounter for preprocedural laboratory examination: Secondary | ICD-10-CM | POA: Diagnosis not present

## 2014-12-12 DIAGNOSIS — G8929 Other chronic pain: Secondary | ICD-10-CM | POA: Diagnosis present

## 2014-12-12 DIAGNOSIS — Z471 Aftercare following joint replacement surgery: Secondary | ICD-10-CM | POA: Diagnosis not present

## 2014-12-12 DIAGNOSIS — E569 Vitamin deficiency, unspecified: Secondary | ICD-10-CM | POA: Diagnosis not present

## 2014-12-12 DIAGNOSIS — Z9889 Other specified postprocedural states: Secondary | ICD-10-CM | POA: Diagnosis not present

## 2014-12-12 DIAGNOSIS — M1990 Unspecified osteoarthritis, unspecified site: Secondary | ICD-10-CM | POA: Diagnosis not present

## 2014-12-12 DIAGNOSIS — Z885 Allergy status to narcotic agent status: Secondary | ICD-10-CM | POA: Diagnosis not present

## 2014-12-12 DIAGNOSIS — I10 Essential (primary) hypertension: Secondary | ICD-10-CM | POA: Diagnosis not present

## 2014-12-12 DIAGNOSIS — J90 Pleural effusion, not elsewhere classified: Secondary | ICD-10-CM | POA: Diagnosis not present

## 2014-12-12 DIAGNOSIS — R9431 Abnormal electrocardiogram [ECG] [EKG]: Secondary | ICD-10-CM | POA: Diagnosis not present

## 2014-12-12 DIAGNOSIS — Z9049 Acquired absence of other specified parts of digestive tract: Secondary | ICD-10-CM | POA: Diagnosis not present

## 2014-12-12 DIAGNOSIS — I081 Rheumatic disorders of both mitral and tricuspid valves: Secondary | ICD-10-CM | POA: Diagnosis not present

## 2014-12-12 DIAGNOSIS — J9691 Respiratory failure, unspecified with hypoxia: Secondary | ICD-10-CM | POA: Diagnosis not present

## 2014-12-12 DIAGNOSIS — M25511 Pain in right shoulder: Secondary | ICD-10-CM | POA: Diagnosis not present

## 2014-12-12 DIAGNOSIS — E119 Type 2 diabetes mellitus without complications: Secondary | ICD-10-CM | POA: Diagnosis not present

## 2014-12-12 DIAGNOSIS — Z7982 Long term (current) use of aspirin: Secondary | ICD-10-CM | POA: Diagnosis not present

## 2014-12-12 DIAGNOSIS — M79601 Pain in right arm: Secondary | ICD-10-CM | POA: Diagnosis not present

## 2014-12-12 DIAGNOSIS — E785 Hyperlipidemia, unspecified: Secondary | ICD-10-CM | POA: Diagnosis present

## 2014-12-12 DIAGNOSIS — R0602 Shortness of breath: Secondary | ICD-10-CM | POA: Diagnosis not present

## 2014-12-12 DIAGNOSIS — M19011 Primary osteoarthritis, right shoulder: Secondary | ICD-10-CM | POA: Diagnosis not present

## 2014-12-12 DIAGNOSIS — M13811 Other specified arthritis, right shoulder: Secondary | ICD-10-CM | POA: Diagnosis not present

## 2014-12-12 DIAGNOSIS — Z794 Long term (current) use of insulin: Secondary | ICD-10-CM | POA: Diagnosis not present

## 2014-12-12 DIAGNOSIS — Z90710 Acquired absence of both cervix and uterus: Secondary | ICD-10-CM | POA: Diagnosis not present

## 2014-12-12 DIAGNOSIS — J449 Chronic obstructive pulmonary disease, unspecified: Secondary | ICD-10-CM | POA: Diagnosis present

## 2014-12-12 DIAGNOSIS — I272 Other secondary pulmonary hypertension: Secondary | ICD-10-CM | POA: Diagnosis not present

## 2014-12-12 DIAGNOSIS — J811 Chronic pulmonary edema: Secondary | ICD-10-CM | POA: Diagnosis not present

## 2014-12-12 DIAGNOSIS — Z96611 Presence of right artificial shoulder joint: Secondary | ICD-10-CM | POA: Diagnosis not present

## 2014-12-12 DIAGNOSIS — G8918 Other acute postprocedural pain: Secondary | ICD-10-CM | POA: Diagnosis not present

## 2014-12-12 DIAGNOSIS — M6281 Muscle weakness (generalized): Secondary | ICD-10-CM | POA: Diagnosis not present

## 2014-12-12 DIAGNOSIS — E876 Hypokalemia: Secondary | ICD-10-CM | POA: Diagnosis not present

## 2014-12-12 DIAGNOSIS — Z87891 Personal history of nicotine dependence: Secondary | ICD-10-CM | POA: Diagnosis not present

## 2014-12-12 DIAGNOSIS — Z882 Allergy status to sulfonamides status: Secondary | ICD-10-CM | POA: Diagnosis not present

## 2014-12-12 DIAGNOSIS — I517 Cardiomegaly: Secondary | ICD-10-CM | POA: Diagnosis not present

## 2014-12-12 DIAGNOSIS — J9811 Atelectasis: Secondary | ICD-10-CM | POA: Diagnosis not present

## 2014-12-12 DIAGNOSIS — R0902 Hypoxemia: Secondary | ICD-10-CM | POA: Diagnosis not present

## 2014-12-12 DIAGNOSIS — J159 Unspecified bacterial pneumonia: Secondary | ICD-10-CM | POA: Diagnosis not present

## 2014-12-12 DIAGNOSIS — R2681 Unsteadiness on feet: Secondary | ICD-10-CM | POA: Diagnosis not present

## 2014-12-12 DIAGNOSIS — J810 Acute pulmonary edema: Secondary | ICD-10-CM | POA: Diagnosis not present

## 2014-12-12 LAB — BASIC METABOLIC PANEL
BUN: 17 mg/dL (ref 4–21)
Creatinine: 0.8 mg/dL (ref <=1.1)
GLUCOSE: 218 mg/dL
POTASSIUM: 3.4 mmol/L (ref 3.4–5.3)
SODIUM: 141 mmol/L (ref 137–147)

## 2014-12-12 LAB — CBC AND DIFFERENTIAL
HCT: 29 % — AB (ref 36–46)
Hemoglobin: 9.9 g/dL — AB (ref 12.0–16.0)
WBC: 8.6 10^3/mL

## 2014-12-19 DIAGNOSIS — K5900 Constipation, unspecified: Secondary | ICD-10-CM | POA: Diagnosis not present

## 2014-12-19 DIAGNOSIS — M19011 Primary osteoarthritis, right shoulder: Secondary | ICD-10-CM | POA: Diagnosis not present

## 2014-12-19 DIAGNOSIS — Z96611 Presence of right artificial shoulder joint: Secondary | ICD-10-CM | POA: Diagnosis not present

## 2014-12-19 DIAGNOSIS — E876 Hypokalemia: Secondary | ICD-10-CM | POA: Diagnosis not present

## 2014-12-19 DIAGNOSIS — M25511 Pain in right shoulder: Secondary | ICD-10-CM | POA: Diagnosis not present

## 2014-12-19 DIAGNOSIS — J3489 Other specified disorders of nose and nasal sinuses: Secondary | ICD-10-CM | POA: Diagnosis not present

## 2014-12-19 DIAGNOSIS — R5381 Other malaise: Secondary | ICD-10-CM | POA: Diagnosis not present

## 2014-12-19 DIAGNOSIS — D62 Acute posthemorrhagic anemia: Secondary | ICD-10-CM | POA: Diagnosis not present

## 2014-12-19 DIAGNOSIS — J189 Pneumonia, unspecified organism: Secondary | ICD-10-CM | POA: Diagnosis not present

## 2014-12-19 DIAGNOSIS — R2681 Unsteadiness on feet: Secondary | ICD-10-CM | POA: Diagnosis not present

## 2014-12-19 DIAGNOSIS — E119 Type 2 diabetes mellitus without complications: Secondary | ICD-10-CM | POA: Diagnosis not present

## 2014-12-19 DIAGNOSIS — J810 Acute pulmonary edema: Secondary | ICD-10-CM | POA: Diagnosis not present

## 2014-12-19 DIAGNOSIS — E569 Vitamin deficiency, unspecified: Secondary | ICD-10-CM | POA: Diagnosis not present

## 2014-12-19 DIAGNOSIS — M6281 Muscle weakness (generalized): Secondary | ICD-10-CM | POA: Diagnosis not present

## 2014-12-19 DIAGNOSIS — E785 Hyperlipidemia, unspecified: Secondary | ICD-10-CM | POA: Diagnosis not present

## 2014-12-19 DIAGNOSIS — I10 Essential (primary) hypertension: Secondary | ICD-10-CM | POA: Diagnosis not present

## 2014-12-19 DIAGNOSIS — M13811 Other specified arthritis, right shoulder: Secondary | ICD-10-CM | POA: Diagnosis not present

## 2014-12-19 DIAGNOSIS — R0602 Shortness of breath: Secondary | ICD-10-CM | POA: Diagnosis not present

## 2014-12-19 DIAGNOSIS — M79601 Pain in right arm: Secondary | ICD-10-CM | POA: Diagnosis not present

## 2014-12-19 DIAGNOSIS — M1990 Unspecified osteoarthritis, unspecified site: Secondary | ICD-10-CM | POA: Diagnosis not present

## 2014-12-19 DIAGNOSIS — J159 Unspecified bacterial pneumonia: Secondary | ICD-10-CM | POA: Diagnosis not present

## 2014-12-19 DIAGNOSIS — Z471 Aftercare following joint replacement surgery: Secondary | ICD-10-CM | POA: Diagnosis not present

## 2014-12-20 ENCOUNTER — Non-Acute Institutional Stay (SKILLED_NURSING_FACILITY): Payer: Medicare Other | Admitting: Internal Medicine

## 2014-12-20 DIAGNOSIS — J810 Acute pulmonary edema: Secondary | ICD-10-CM

## 2014-12-20 DIAGNOSIS — D62 Acute posthemorrhagic anemia: Secondary | ICD-10-CM

## 2014-12-20 DIAGNOSIS — J3489 Other specified disorders of nose and nasal sinuses: Secondary | ICD-10-CM | POA: Diagnosis not present

## 2014-12-20 DIAGNOSIS — M19011 Primary osteoarthritis, right shoulder: Secondary | ICD-10-CM | POA: Diagnosis not present

## 2014-12-20 DIAGNOSIS — R5381 Other malaise: Secondary | ICD-10-CM | POA: Diagnosis not present

## 2014-12-20 DIAGNOSIS — E119 Type 2 diabetes mellitus without complications: Secondary | ICD-10-CM

## 2014-12-20 DIAGNOSIS — E785 Hyperlipidemia, unspecified: Secondary | ICD-10-CM

## 2014-12-20 DIAGNOSIS — K5900 Constipation, unspecified: Secondary | ICD-10-CM | POA: Diagnosis not present

## 2014-12-20 DIAGNOSIS — J189 Pneumonia, unspecified organism: Secondary | ICD-10-CM

## 2014-12-20 DIAGNOSIS — I10 Essential (primary) hypertension: Secondary | ICD-10-CM | POA: Diagnosis not present

## 2014-12-20 NOTE — Progress Notes (Signed)
Patient ID: Allison Duffy, female   DOB: 03-26-1926, 81 y.o.   MRN: [id number]     [facility]   PCP: ARONSON,RICHARD A, MD  Code Status: full code  Allergies  Allergen Reactions  . Codeine Nausea And Vomiting  . Hydrocodone     Nausea and vomiting  . Sulfa Antibiotics Itching and Nausea Only    Chief Complaint  Patient presents with  . New Admit To SNF     HPI:  81 y.o. patient is here for short term rehabilitation post hospital admission from 12/12/14-12/19/14 with pulmonary edema with pneumonia and right shoulder replacement surgery. She was started on iv antibiotics and then switched to po levaquin. She required diuresis. She is seen in her room today. She is on o2 by nasal canula and her breathing is stable. Her pain is under control with current regimen. She has occasional dizziness with position change. She has cough with clear phlegm at present. She has dryness in her nostrils. She has PMH of HTN, tpe 2 DM, HLD among others.  Review of Systems:  Constitutional: Negative for fever, chills, diaphoresis.  HENT: Negative for headache, congestion, nasal discharge, difficulty swallowing.   Eyes: Negative for eye pain, blurred vision, double vision and discharge.  Respiratory: positive for dyspnea with exertion. Negative for wheezing.   Cardiovascular: Negative for chest pain, palpitations, leg swelling.  Gastrointestinal: Negative for heartburn, nausea, vomiting, abdominal pain. Had bowel movement yesterday Genitourinary: Negative for dysuria, flank pain.  Musculoskeletal: Negative for back pain, falls. Skin: Negative for itching, rash.  Neurological: Negative for tingling, focal weakness Psychiatric/Behavioral: Negative for depression    Past Medical History  Diagnosis Date  . Hypertension   . Hyperlipidemia   . Chest pain   . PONV (postoperative nausea and vomiting)   . Type II diabetes mellitus   . Arthritis     "scattered  joints" (07/29/2013)  . Gout attack ?[id number]   Past Surgical History  Procedure Laterality Date  . Olecranon bursectomy Left 07/29/2013    "in ER"  . Tonsillectomy    . Appendectomy    . Cholecystectomy    . Carpal tunnel release Right   . Knee arthroscopy Right   . Cataract extraction w/ intraocular lens  implant, bilateral Bilateral   . Vaginal hysterectomy    . Parathyroidectomy     Social History:   reports that she quit smoking about 61 years ago. Her smoking use included Cigarettes. She quit after 5 years of use. She has never used smokeless tobacco. She reports that she does not drink alcohol or use illicit drugs.  Family History  Problem Relation Age of Onset  . Heart disease Mother   . Diabetes Mother   . Hypertension Mother   . Hypertension Father   . Heart disease Father   . Heart disease Brother     both brothers  . Diabetes Brother   . Hypertension Brother     Medications: Medication reviewed. See MAR  Physical Exam: Filed Vitals:   12/20/14 2007  BP: 148/67  Pulse: 67  Temp: 97.5 F (36.4 C)  Resp: 19  SpO2: 95%    General- elderly female, obese, in no acute distress Head- normocephalic, atraumatic Nose- normal nasal mucosa, no maxillary or frontal sinus tenderness, no nasal discharge Throat- moist mucus membrane Eyes- PERRLA, EOMI, no pallor, no icterus, no discharge, normal conjunctiva, normal sclera Neck- no cervical lymphadenopathy, no JVD Cardiovascular- normal s1,s2, no murmurs, good radial pulses,  1+ leg edema Respiratory- bilateral decreased air entry, no wheeze, no rhonchi, no crackles, no use of accessory muscles, on o2 Abdomen- bowel sounds present, soft, non tender Musculoskeletal- right arm in sling, able to move her fingers and has good pulse and circulation. Able to move other extremities.  Neurological- no focal deficit, alert and oriented to person, place and time Skin- warm and dry Psychiatry- normal mood and affect    Labs and  imaging reviewed   Assessment/Pla  Physical deconditioning Will have her work with physical therapy and occupational therapy team to help with gait training and muscle strengthening exercises.fall precautions. Skin care. Encourage to be out of bed.   Pulmonary edema S/p diuresis. Continue lasix 40 mg daily and lisinopril 40 mg daily with kcl supple,ent. Add ted hose to help with leg edema. Check bmp  Right shoulder OA S/p shoulder replacement. NWB to RUE. Continue sling. Has follow up with orthopedics. Continue tylenol 1000 mg q6h for pain with tramadol 50 mg q8h prn pain. Continue calcium and vitamin d. Continue aspirin 325 mg bid for dvt prophylaxis  CAP Continue and complete her antibiotic course of levaquin x 10 days, continue o2 for now. to wean off oxygen as tolerated to keep o2 sat > 90%  Dry nares Start nasal saline spray to each nostril  Blood loss anemia Monitor h&h. Continue nu-iron 150 mg daily for now  Constipation Continue miralax daily and senokot s 2 tab bid prn  HTN Monitor BP reading, continue norvasc 10 mg daily, clonidine 0.1 mg bid, lisinopril 40 mg daily and lasix 40 mg daily, check bmp  DM Monitor cbg, continue glipizide 10 mg daily, januvia 100 mg daily, metformin 500 mg bid  HLD Continue her pravastatib  Goals of care: short term rehabilitation   Labs/tests ordered: cbc with diff, cmp 12/25/14  Family/ staff Communication: reviewed care plan with patient and nursing supervisor    Blanchie Serve, MD  [facility] [phone] (Monday-Friday 8 am - 5 pm) [phone] (afterhours)

## 2014-12-26 ENCOUNTER — Encounter: Payer: Self-pay | Admitting: *Deleted

## 2014-12-26 DIAGNOSIS — Z471 Aftercare following joint replacement surgery: Secondary | ICD-10-CM | POA: Diagnosis not present

## 2014-12-26 DIAGNOSIS — M19011 Primary osteoarthritis, right shoulder: Secondary | ICD-10-CM | POA: Diagnosis not present

## 2014-12-26 DIAGNOSIS — Z96611 Presence of right artificial shoulder joint: Secondary | ICD-10-CM | POA: Diagnosis not present

## 2014-12-26 LAB — CBC AND DIFFERENTIAL: Platelets: 259 10*3/uL (ref 150–399)

## 2014-12-27 DIAGNOSIS — M19011 Primary osteoarthritis, right shoulder: Secondary | ICD-10-CM | POA: Insufficient documentation

## 2015-01-12 ENCOUNTER — Encounter: Payer: Self-pay | Admitting: Adult Health

## 2015-01-15 DIAGNOSIS — Z471 Aftercare following joint replacement surgery: Secondary | ICD-10-CM | POA: Diagnosis not present

## 2015-01-15 DIAGNOSIS — Z96611 Presence of right artificial shoulder joint: Secondary | ICD-10-CM | POA: Diagnosis not present

## 2015-01-15 DIAGNOSIS — Z4789 Encounter for other orthopedic aftercare: Secondary | ICD-10-CM | POA: Diagnosis not present

## 2015-01-16 DIAGNOSIS — E785 Hyperlipidemia, unspecified: Secondary | ICD-10-CM | POA: Diagnosis not present

## 2015-01-16 DIAGNOSIS — Z471 Aftercare following joint replacement surgery: Secondary | ICD-10-CM | POA: Diagnosis not present

## 2015-01-16 DIAGNOSIS — Z96611 Presence of right artificial shoulder joint: Secondary | ICD-10-CM | POA: Diagnosis not present

## 2015-01-16 DIAGNOSIS — E119 Type 2 diabetes mellitus without complications: Secondary | ICD-10-CM | POA: Diagnosis not present

## 2015-01-16 DIAGNOSIS — I10 Essential (primary) hypertension: Secondary | ICD-10-CM | POA: Diagnosis not present

## 2015-01-16 DIAGNOSIS — Z8701 Personal history of pneumonia (recurrent): Secondary | ICD-10-CM | POA: Diagnosis not present

## 2015-01-18 DIAGNOSIS — Z6829 Body mass index (BMI) 29.0-29.9, adult: Secondary | ICD-10-CM | POA: Diagnosis not present

## 2015-01-18 DIAGNOSIS — E876 Hypokalemia: Secondary | ICD-10-CM | POA: Diagnosis not present

## 2015-01-18 DIAGNOSIS — Z471 Aftercare following joint replacement surgery: Secondary | ICD-10-CM | POA: Diagnosis not present

## 2015-01-18 DIAGNOSIS — E119 Type 2 diabetes mellitus without complications: Secondary | ICD-10-CM | POA: Diagnosis not present

## 2015-01-18 DIAGNOSIS — E785 Hyperlipidemia, unspecified: Secondary | ICD-10-CM | POA: Diagnosis not present

## 2015-01-18 DIAGNOSIS — I10 Essential (primary) hypertension: Secondary | ICD-10-CM | POA: Diagnosis not present

## 2015-01-18 DIAGNOSIS — E1129 Type 2 diabetes mellitus with other diabetic kidney complication: Secondary | ICD-10-CM | POA: Diagnosis not present

## 2015-01-18 DIAGNOSIS — Z8701 Personal history of pneumonia (recurrent): Secondary | ICD-10-CM | POA: Diagnosis not present

## 2015-01-18 DIAGNOSIS — N183 Chronic kidney disease, stage 3 (moderate): Secondary | ICD-10-CM | POA: Diagnosis not present

## 2015-01-18 DIAGNOSIS — R6889 Other general symptoms and signs: Secondary | ICD-10-CM | POA: Diagnosis not present

## 2015-01-18 DIAGNOSIS — I129 Hypertensive chronic kidney disease with stage 1 through stage 4 chronic kidney disease, or unspecified chronic kidney disease: Secondary | ICD-10-CM | POA: Diagnosis not present

## 2015-01-18 DIAGNOSIS — Z0289 Encounter for other administrative examinations: Secondary | ICD-10-CM | POA: Diagnosis not present

## 2015-01-18 DIAGNOSIS — Z96611 Presence of right artificial shoulder joint: Secondary | ICD-10-CM | POA: Diagnosis not present

## 2015-01-22 DIAGNOSIS — I10 Essential (primary) hypertension: Secondary | ICD-10-CM | POA: Diagnosis not present

## 2015-01-22 DIAGNOSIS — E119 Type 2 diabetes mellitus without complications: Secondary | ICD-10-CM | POA: Diagnosis not present

## 2015-01-22 DIAGNOSIS — Z8701 Personal history of pneumonia (recurrent): Secondary | ICD-10-CM | POA: Diagnosis not present

## 2015-01-22 DIAGNOSIS — Z471 Aftercare following joint replacement surgery: Secondary | ICD-10-CM | POA: Diagnosis not present

## 2015-01-22 DIAGNOSIS — Z96611 Presence of right artificial shoulder joint: Secondary | ICD-10-CM | POA: Diagnosis not present

## 2015-01-22 DIAGNOSIS — E785 Hyperlipidemia, unspecified: Secondary | ICD-10-CM | POA: Diagnosis not present

## 2015-01-23 DIAGNOSIS — Z8701 Personal history of pneumonia (recurrent): Secondary | ICD-10-CM | POA: Diagnosis not present

## 2015-01-23 DIAGNOSIS — E785 Hyperlipidemia, unspecified: Secondary | ICD-10-CM | POA: Diagnosis not present

## 2015-01-23 DIAGNOSIS — I10 Essential (primary) hypertension: Secondary | ICD-10-CM | POA: Diagnosis not present

## 2015-01-23 DIAGNOSIS — E119 Type 2 diabetes mellitus without complications: Secondary | ICD-10-CM | POA: Diagnosis not present

## 2015-01-23 DIAGNOSIS — Z96611 Presence of right artificial shoulder joint: Secondary | ICD-10-CM | POA: Diagnosis not present

## 2015-01-23 DIAGNOSIS — Z471 Aftercare following joint replacement surgery: Secondary | ICD-10-CM | POA: Diagnosis not present

## 2015-01-24 DIAGNOSIS — Z8701 Personal history of pneumonia (recurrent): Secondary | ICD-10-CM | POA: Diagnosis not present

## 2015-01-24 DIAGNOSIS — E119 Type 2 diabetes mellitus without complications: Secondary | ICD-10-CM | POA: Diagnosis not present

## 2015-01-24 DIAGNOSIS — I10 Essential (primary) hypertension: Secondary | ICD-10-CM | POA: Diagnosis not present

## 2015-01-24 DIAGNOSIS — Z96611 Presence of right artificial shoulder joint: Secondary | ICD-10-CM | POA: Diagnosis not present

## 2015-01-24 DIAGNOSIS — Z471 Aftercare following joint replacement surgery: Secondary | ICD-10-CM | POA: Diagnosis not present

## 2015-01-24 DIAGNOSIS — E785 Hyperlipidemia, unspecified: Secondary | ICD-10-CM | POA: Diagnosis not present

## 2015-01-25 DIAGNOSIS — Z6830 Body mass index (BMI) 30.0-30.9, adult: Secondary | ICD-10-CM | POA: Diagnosis not present

## 2015-01-25 DIAGNOSIS — I129 Hypertensive chronic kidney disease with stage 1 through stage 4 chronic kidney disease, or unspecified chronic kidney disease: Secondary | ICD-10-CM | POA: Diagnosis not present

## 2015-01-25 DIAGNOSIS — E119 Type 2 diabetes mellitus without complications: Secondary | ICD-10-CM | POA: Diagnosis not present

## 2015-01-25 DIAGNOSIS — Z96611 Presence of right artificial shoulder joint: Secondary | ICD-10-CM | POA: Diagnosis not present

## 2015-01-25 DIAGNOSIS — R6889 Other general symptoms and signs: Secondary | ICD-10-CM | POA: Diagnosis not present

## 2015-01-25 DIAGNOSIS — N183 Chronic kidney disease, stage 3 (moderate): Secondary | ICD-10-CM | POA: Diagnosis not present

## 2015-01-25 DIAGNOSIS — I10 Essential (primary) hypertension: Secondary | ICD-10-CM | POA: Diagnosis not present

## 2015-01-25 DIAGNOSIS — E876 Hypokalemia: Secondary | ICD-10-CM | POA: Diagnosis not present

## 2015-01-25 DIAGNOSIS — E785 Hyperlipidemia, unspecified: Secondary | ICD-10-CM | POA: Diagnosis not present

## 2015-01-25 DIAGNOSIS — Z471 Aftercare following joint replacement surgery: Secondary | ICD-10-CM | POA: Diagnosis not present

## 2015-01-25 DIAGNOSIS — Z8701 Personal history of pneumonia (recurrent): Secondary | ICD-10-CM | POA: Diagnosis not present

## 2015-01-25 DIAGNOSIS — E1129 Type 2 diabetes mellitus with other diabetic kidney complication: Secondary | ICD-10-CM | POA: Diagnosis not present

## 2015-01-26 DIAGNOSIS — Z96611 Presence of right artificial shoulder joint: Secondary | ICD-10-CM | POA: Diagnosis not present

## 2015-01-26 DIAGNOSIS — Z471 Aftercare following joint replacement surgery: Secondary | ICD-10-CM | POA: Diagnosis not present

## 2015-01-26 DIAGNOSIS — Z8701 Personal history of pneumonia (recurrent): Secondary | ICD-10-CM | POA: Diagnosis not present

## 2015-01-26 DIAGNOSIS — E119 Type 2 diabetes mellitus without complications: Secondary | ICD-10-CM | POA: Diagnosis not present

## 2015-01-26 DIAGNOSIS — I10 Essential (primary) hypertension: Secondary | ICD-10-CM | POA: Diagnosis not present

## 2015-01-26 DIAGNOSIS — E785 Hyperlipidemia, unspecified: Secondary | ICD-10-CM | POA: Diagnosis not present

## 2015-01-28 HISTORY — PX: TOTAL SHOULDER REPLACEMENT: SUR1217

## 2015-01-29 DIAGNOSIS — I10 Essential (primary) hypertension: Secondary | ICD-10-CM | POA: Diagnosis not present

## 2015-01-29 DIAGNOSIS — Z96611 Presence of right artificial shoulder joint: Secondary | ICD-10-CM | POA: Diagnosis not present

## 2015-01-29 DIAGNOSIS — Z471 Aftercare following joint replacement surgery: Secondary | ICD-10-CM | POA: Diagnosis not present

## 2015-01-29 DIAGNOSIS — E119 Type 2 diabetes mellitus without complications: Secondary | ICD-10-CM | POA: Diagnosis not present

## 2015-01-29 DIAGNOSIS — E785 Hyperlipidemia, unspecified: Secondary | ICD-10-CM | POA: Diagnosis not present

## 2015-01-29 DIAGNOSIS — Z8701 Personal history of pneumonia (recurrent): Secondary | ICD-10-CM | POA: Diagnosis not present

## 2015-01-30 DIAGNOSIS — E785 Hyperlipidemia, unspecified: Secondary | ICD-10-CM | POA: Diagnosis not present

## 2015-01-30 DIAGNOSIS — E119 Type 2 diabetes mellitus without complications: Secondary | ICD-10-CM | POA: Diagnosis not present

## 2015-01-30 DIAGNOSIS — Z471 Aftercare following joint replacement surgery: Secondary | ICD-10-CM | POA: Diagnosis not present

## 2015-01-30 DIAGNOSIS — I10 Essential (primary) hypertension: Secondary | ICD-10-CM | POA: Diagnosis not present

## 2015-01-30 DIAGNOSIS — Z8701 Personal history of pneumonia (recurrent): Secondary | ICD-10-CM | POA: Diagnosis not present

## 2015-01-30 DIAGNOSIS — Z96611 Presence of right artificial shoulder joint: Secondary | ICD-10-CM | POA: Diagnosis not present

## 2015-01-31 DIAGNOSIS — Z471 Aftercare following joint replacement surgery: Secondary | ICD-10-CM | POA: Diagnosis not present

## 2015-01-31 DIAGNOSIS — I10 Essential (primary) hypertension: Secondary | ICD-10-CM | POA: Diagnosis not present

## 2015-01-31 DIAGNOSIS — Z8701 Personal history of pneumonia (recurrent): Secondary | ICD-10-CM | POA: Diagnosis not present

## 2015-01-31 DIAGNOSIS — E785 Hyperlipidemia, unspecified: Secondary | ICD-10-CM | POA: Diagnosis not present

## 2015-01-31 DIAGNOSIS — Z96611 Presence of right artificial shoulder joint: Secondary | ICD-10-CM | POA: Diagnosis not present

## 2015-01-31 DIAGNOSIS — E119 Type 2 diabetes mellitus without complications: Secondary | ICD-10-CM | POA: Diagnosis not present

## 2015-02-01 DIAGNOSIS — E785 Hyperlipidemia, unspecified: Secondary | ICD-10-CM | POA: Diagnosis not present

## 2015-02-01 DIAGNOSIS — Z8701 Personal history of pneumonia (recurrent): Secondary | ICD-10-CM | POA: Diagnosis not present

## 2015-02-01 DIAGNOSIS — Z96611 Presence of right artificial shoulder joint: Secondary | ICD-10-CM | POA: Diagnosis not present

## 2015-02-01 DIAGNOSIS — Z471 Aftercare following joint replacement surgery: Secondary | ICD-10-CM | POA: Diagnosis not present

## 2015-02-01 DIAGNOSIS — E119 Type 2 diabetes mellitus without complications: Secondary | ICD-10-CM | POA: Diagnosis not present

## 2015-02-01 DIAGNOSIS — I10 Essential (primary) hypertension: Secondary | ICD-10-CM | POA: Diagnosis not present

## 2015-02-07 DIAGNOSIS — Z8701 Personal history of pneumonia (recurrent): Secondary | ICD-10-CM | POA: Diagnosis not present

## 2015-02-07 DIAGNOSIS — I10 Essential (primary) hypertension: Secondary | ICD-10-CM | POA: Diagnosis not present

## 2015-02-07 DIAGNOSIS — Z96611 Presence of right artificial shoulder joint: Secondary | ICD-10-CM | POA: Diagnosis not present

## 2015-02-07 DIAGNOSIS — Z471 Aftercare following joint replacement surgery: Secondary | ICD-10-CM | POA: Diagnosis not present

## 2015-02-07 DIAGNOSIS — E785 Hyperlipidemia, unspecified: Secondary | ICD-10-CM | POA: Diagnosis not present

## 2015-02-07 DIAGNOSIS — E119 Type 2 diabetes mellitus without complications: Secondary | ICD-10-CM | POA: Diagnosis not present

## 2015-02-08 DIAGNOSIS — I10 Essential (primary) hypertension: Secondary | ICD-10-CM | POA: Diagnosis not present

## 2015-02-08 DIAGNOSIS — E785 Hyperlipidemia, unspecified: Secondary | ICD-10-CM | POA: Diagnosis not present

## 2015-02-08 DIAGNOSIS — Z471 Aftercare following joint replacement surgery: Secondary | ICD-10-CM | POA: Diagnosis not present

## 2015-02-08 DIAGNOSIS — Z8701 Personal history of pneumonia (recurrent): Secondary | ICD-10-CM | POA: Diagnosis not present

## 2015-02-08 DIAGNOSIS — Z96611 Presence of right artificial shoulder joint: Secondary | ICD-10-CM | POA: Diagnosis not present

## 2015-02-08 DIAGNOSIS — E119 Type 2 diabetes mellitus without complications: Secondary | ICD-10-CM | POA: Diagnosis not present

## 2015-02-12 DIAGNOSIS — Z471 Aftercare following joint replacement surgery: Secondary | ICD-10-CM | POA: Diagnosis not present

## 2015-02-12 DIAGNOSIS — E785 Hyperlipidemia, unspecified: Secondary | ICD-10-CM | POA: Diagnosis not present

## 2015-02-12 DIAGNOSIS — Z96611 Presence of right artificial shoulder joint: Secondary | ICD-10-CM | POA: Diagnosis not present

## 2015-02-12 DIAGNOSIS — E119 Type 2 diabetes mellitus without complications: Secondary | ICD-10-CM | POA: Diagnosis not present

## 2015-02-12 DIAGNOSIS — I10 Essential (primary) hypertension: Secondary | ICD-10-CM | POA: Diagnosis not present

## 2015-02-12 DIAGNOSIS — Z8701 Personal history of pneumonia (recurrent): Secondary | ICD-10-CM | POA: Diagnosis not present

## 2015-02-15 DIAGNOSIS — Z96611 Presence of right artificial shoulder joint: Secondary | ICD-10-CM | POA: Diagnosis not present

## 2015-02-15 DIAGNOSIS — E785 Hyperlipidemia, unspecified: Secondary | ICD-10-CM | POA: Diagnosis not present

## 2015-02-15 DIAGNOSIS — I10 Essential (primary) hypertension: Secondary | ICD-10-CM | POA: Diagnosis not present

## 2015-02-15 DIAGNOSIS — Z8701 Personal history of pneumonia (recurrent): Secondary | ICD-10-CM | POA: Diagnosis not present

## 2015-02-15 DIAGNOSIS — Z471 Aftercare following joint replacement surgery: Secondary | ICD-10-CM | POA: Diagnosis not present

## 2015-02-15 DIAGNOSIS — E119 Type 2 diabetes mellitus without complications: Secondary | ICD-10-CM | POA: Diagnosis not present

## 2015-02-19 DIAGNOSIS — I10 Essential (primary) hypertension: Secondary | ICD-10-CM | POA: Diagnosis not present

## 2015-02-19 DIAGNOSIS — Z471 Aftercare following joint replacement surgery: Secondary | ICD-10-CM | POA: Diagnosis not present

## 2015-02-19 DIAGNOSIS — Z8701 Personal history of pneumonia (recurrent): Secondary | ICD-10-CM | POA: Diagnosis not present

## 2015-02-19 DIAGNOSIS — E785 Hyperlipidemia, unspecified: Secondary | ICD-10-CM | POA: Diagnosis not present

## 2015-02-19 DIAGNOSIS — Z96611 Presence of right artificial shoulder joint: Secondary | ICD-10-CM | POA: Diagnosis not present

## 2015-02-19 DIAGNOSIS — E119 Type 2 diabetes mellitus without complications: Secondary | ICD-10-CM | POA: Diagnosis not present

## 2015-02-22 DIAGNOSIS — E785 Hyperlipidemia, unspecified: Secondary | ICD-10-CM | POA: Diagnosis not present

## 2015-02-22 DIAGNOSIS — Z96611 Presence of right artificial shoulder joint: Secondary | ICD-10-CM | POA: Diagnosis not present

## 2015-02-22 DIAGNOSIS — I10 Essential (primary) hypertension: Secondary | ICD-10-CM | POA: Diagnosis not present

## 2015-02-22 DIAGNOSIS — E119 Type 2 diabetes mellitus without complications: Secondary | ICD-10-CM | POA: Diagnosis not present

## 2015-02-22 DIAGNOSIS — Z471 Aftercare following joint replacement surgery: Secondary | ICD-10-CM | POA: Diagnosis not present

## 2015-02-22 DIAGNOSIS — Z8701 Personal history of pneumonia (recurrent): Secondary | ICD-10-CM | POA: Diagnosis not present

## 2015-02-26 DIAGNOSIS — I10 Essential (primary) hypertension: Secondary | ICD-10-CM | POA: Diagnosis not present

## 2015-02-26 DIAGNOSIS — Z471 Aftercare following joint replacement surgery: Secondary | ICD-10-CM | POA: Diagnosis not present

## 2015-02-26 DIAGNOSIS — Z8701 Personal history of pneumonia (recurrent): Secondary | ICD-10-CM | POA: Diagnosis not present

## 2015-02-26 DIAGNOSIS — Z96611 Presence of right artificial shoulder joint: Secondary | ICD-10-CM | POA: Diagnosis not present

## 2015-02-26 DIAGNOSIS — E785 Hyperlipidemia, unspecified: Secondary | ICD-10-CM | POA: Diagnosis not present

## 2015-02-26 DIAGNOSIS — E119 Type 2 diabetes mellitus without complications: Secondary | ICD-10-CM | POA: Diagnosis not present

## 2015-02-28 DIAGNOSIS — E784 Other hyperlipidemia: Secondary | ICD-10-CM | POA: Diagnosis not present

## 2015-02-28 DIAGNOSIS — R8290 Unspecified abnormal findings in urine: Secondary | ICD-10-CM | POA: Diagnosis not present

## 2015-02-28 DIAGNOSIS — E1129 Type 2 diabetes mellitus with other diabetic kidney complication: Secondary | ICD-10-CM | POA: Diagnosis not present

## 2015-02-28 DIAGNOSIS — N390 Urinary tract infection, site not specified: Secondary | ICD-10-CM | POA: Diagnosis not present

## 2015-03-01 DIAGNOSIS — Z96611 Presence of right artificial shoulder joint: Secondary | ICD-10-CM | POA: Diagnosis not present

## 2015-03-01 DIAGNOSIS — E119 Type 2 diabetes mellitus without complications: Secondary | ICD-10-CM | POA: Diagnosis not present

## 2015-03-01 DIAGNOSIS — Z471 Aftercare following joint replacement surgery: Secondary | ICD-10-CM | POA: Diagnosis not present

## 2015-03-01 DIAGNOSIS — E785 Hyperlipidemia, unspecified: Secondary | ICD-10-CM | POA: Diagnosis not present

## 2015-03-01 DIAGNOSIS — I10 Essential (primary) hypertension: Secondary | ICD-10-CM | POA: Diagnosis not present

## 2015-03-01 DIAGNOSIS — Z8701 Personal history of pneumonia (recurrent): Secondary | ICD-10-CM | POA: Diagnosis not present

## 2015-03-05 DIAGNOSIS — Z96611 Presence of right artificial shoulder joint: Secondary | ICD-10-CM | POA: Diagnosis not present

## 2015-03-05 DIAGNOSIS — Z87891 Personal history of nicotine dependence: Secondary | ICD-10-CM | POA: Diagnosis not present

## 2015-03-05 DIAGNOSIS — M19011 Primary osteoarthritis, right shoulder: Secondary | ICD-10-CM | POA: Diagnosis not present

## 2015-03-05 DIAGNOSIS — Z471 Aftercare following joint replacement surgery: Secondary | ICD-10-CM | POA: Diagnosis not present

## 2015-03-06 DIAGNOSIS — Z8701 Personal history of pneumonia (recurrent): Secondary | ICD-10-CM | POA: Diagnosis not present

## 2015-03-06 DIAGNOSIS — Z471 Aftercare following joint replacement surgery: Secondary | ICD-10-CM | POA: Diagnosis not present

## 2015-03-06 DIAGNOSIS — E785 Hyperlipidemia, unspecified: Secondary | ICD-10-CM | POA: Diagnosis not present

## 2015-03-06 DIAGNOSIS — E119 Type 2 diabetes mellitus without complications: Secondary | ICD-10-CM | POA: Diagnosis not present

## 2015-03-06 DIAGNOSIS — Z96611 Presence of right artificial shoulder joint: Secondary | ICD-10-CM | POA: Diagnosis not present

## 2015-03-06 DIAGNOSIS — I10 Essential (primary) hypertension: Secondary | ICD-10-CM | POA: Diagnosis not present

## 2015-03-07 DIAGNOSIS — R269 Unspecified abnormalities of gait and mobility: Secondary | ICD-10-CM | POA: Diagnosis not present

## 2015-03-07 DIAGNOSIS — R6889 Other general symptoms and signs: Secondary | ICD-10-CM | POA: Diagnosis not present

## 2015-03-07 DIAGNOSIS — E1129 Type 2 diabetes mellitus with other diabetic kidney complication: Secondary | ICD-10-CM | POA: Diagnosis not present

## 2015-03-07 DIAGNOSIS — E876 Hypokalemia: Secondary | ICD-10-CM | POA: Diagnosis not present

## 2015-03-07 DIAGNOSIS — E663 Overweight: Secondary | ICD-10-CM | POA: Diagnosis not present

## 2015-03-07 DIAGNOSIS — R911 Solitary pulmonary nodule: Secondary | ICD-10-CM | POA: Diagnosis not present

## 2015-03-07 DIAGNOSIS — Z1212 Encounter for screening for malignant neoplasm of rectum: Secondary | ICD-10-CM | POA: Diagnosis not present

## 2015-03-07 DIAGNOSIS — I129 Hypertensive chronic kidney disease with stage 1 through stage 4 chronic kidney disease, or unspecified chronic kidney disease: Secondary | ICD-10-CM | POA: Diagnosis not present

## 2015-03-07 DIAGNOSIS — N183 Chronic kidney disease, stage 3 (moderate): Secondary | ICD-10-CM | POA: Diagnosis not present

## 2015-03-07 DIAGNOSIS — Z1389 Encounter for screening for other disorder: Secondary | ICD-10-CM | POA: Diagnosis not present

## 2015-03-07 DIAGNOSIS — Z6829 Body mass index (BMI) 29.0-29.9, adult: Secondary | ICD-10-CM | POA: Diagnosis not present

## 2015-03-07 DIAGNOSIS — M19011 Primary osteoarthritis, right shoulder: Secondary | ICD-10-CM | POA: Diagnosis not present

## 2015-03-08 DIAGNOSIS — Z96611 Presence of right artificial shoulder joint: Secondary | ICD-10-CM | POA: Diagnosis not present

## 2015-03-08 DIAGNOSIS — E785 Hyperlipidemia, unspecified: Secondary | ICD-10-CM | POA: Diagnosis not present

## 2015-03-08 DIAGNOSIS — Z8701 Personal history of pneumonia (recurrent): Secondary | ICD-10-CM | POA: Diagnosis not present

## 2015-03-08 DIAGNOSIS — E119 Type 2 diabetes mellitus without complications: Secondary | ICD-10-CM | POA: Diagnosis not present

## 2015-03-08 DIAGNOSIS — I10 Essential (primary) hypertension: Secondary | ICD-10-CM | POA: Diagnosis not present

## 2015-03-08 DIAGNOSIS — Z471 Aftercare following joint replacement surgery: Secondary | ICD-10-CM | POA: Diagnosis not present

## 2015-03-12 DIAGNOSIS — Z96611 Presence of right artificial shoulder joint: Secondary | ICD-10-CM | POA: Diagnosis not present

## 2015-03-12 DIAGNOSIS — M25611 Stiffness of right shoulder, not elsewhere classified: Secondary | ICD-10-CM | POA: Diagnosis not present

## 2015-03-12 DIAGNOSIS — M6281 Muscle weakness (generalized): Secondary | ICD-10-CM | POA: Diagnosis not present

## 2015-03-15 DIAGNOSIS — Z96611 Presence of right artificial shoulder joint: Secondary | ICD-10-CM | POA: Diagnosis not present

## 2015-03-15 DIAGNOSIS — M1990 Unspecified osteoarthritis, unspecified site: Secondary | ICD-10-CM | POA: Diagnosis not present

## 2015-03-15 DIAGNOSIS — M6281 Muscle weakness (generalized): Secondary | ICD-10-CM | POA: Diagnosis not present

## 2015-03-15 DIAGNOSIS — M25611 Stiffness of right shoulder, not elsewhere classified: Secondary | ICD-10-CM | POA: Diagnosis not present

## 2015-03-19 DIAGNOSIS — M6281 Muscle weakness (generalized): Secondary | ICD-10-CM | POA: Diagnosis not present

## 2015-03-19 DIAGNOSIS — Z96611 Presence of right artificial shoulder joint: Secondary | ICD-10-CM | POA: Diagnosis not present

## 2015-03-19 DIAGNOSIS — M25611 Stiffness of right shoulder, not elsewhere classified: Secondary | ICD-10-CM | POA: Diagnosis not present

## 2015-03-20 DIAGNOSIS — H353130 Nonexudative age-related macular degeneration, bilateral, stage unspecified: Secondary | ICD-10-CM | POA: Diagnosis not present

## 2015-03-20 DIAGNOSIS — H04121 Dry eye syndrome of right lacrimal gland: Secondary | ICD-10-CM | POA: Diagnosis not present

## 2015-03-20 DIAGNOSIS — Z961 Presence of intraocular lens: Secondary | ICD-10-CM | POA: Diagnosis not present

## 2015-03-20 DIAGNOSIS — H04122 Dry eye syndrome of left lacrimal gland: Secondary | ICD-10-CM | POA: Diagnosis not present

## 2015-03-21 DIAGNOSIS — Z96611 Presence of right artificial shoulder joint: Secondary | ICD-10-CM | POA: Diagnosis not present

## 2015-03-21 DIAGNOSIS — M25611 Stiffness of right shoulder, not elsewhere classified: Secondary | ICD-10-CM | POA: Diagnosis not present

## 2015-03-21 DIAGNOSIS — M6281 Muscle weakness (generalized): Secondary | ICD-10-CM | POA: Diagnosis not present

## 2015-03-26 DIAGNOSIS — M6281 Muscle weakness (generalized): Secondary | ICD-10-CM | POA: Diagnosis not present

## 2015-03-26 DIAGNOSIS — M25611 Stiffness of right shoulder, not elsewhere classified: Secondary | ICD-10-CM | POA: Diagnosis not present

## 2015-03-26 DIAGNOSIS — Z96611 Presence of right artificial shoulder joint: Secondary | ICD-10-CM | POA: Diagnosis not present

## 2015-03-28 DIAGNOSIS — M6281 Muscle weakness (generalized): Secondary | ICD-10-CM | POA: Diagnosis not present

## 2015-03-28 DIAGNOSIS — Z96611 Presence of right artificial shoulder joint: Secondary | ICD-10-CM | POA: Diagnosis not present

## 2015-03-28 DIAGNOSIS — M25611 Stiffness of right shoulder, not elsewhere classified: Secondary | ICD-10-CM | POA: Diagnosis not present

## 2015-04-02 DIAGNOSIS — Z96611 Presence of right artificial shoulder joint: Secondary | ICD-10-CM | POA: Diagnosis not present

## 2015-04-02 DIAGNOSIS — M25611 Stiffness of right shoulder, not elsewhere classified: Secondary | ICD-10-CM | POA: Diagnosis not present

## 2015-04-02 DIAGNOSIS — M6281 Muscle weakness (generalized): Secondary | ICD-10-CM | POA: Diagnosis not present

## 2015-04-04 DIAGNOSIS — M6281 Muscle weakness (generalized): Secondary | ICD-10-CM | POA: Diagnosis not present

## 2015-04-04 DIAGNOSIS — M25611 Stiffness of right shoulder, not elsewhere classified: Secondary | ICD-10-CM | POA: Diagnosis not present

## 2015-04-04 DIAGNOSIS — Z96611 Presence of right artificial shoulder joint: Secondary | ICD-10-CM | POA: Diagnosis not present

## 2015-04-09 DIAGNOSIS — Z96611 Presence of right artificial shoulder joint: Secondary | ICD-10-CM | POA: Diagnosis not present

## 2015-04-09 DIAGNOSIS — M25611 Stiffness of right shoulder, not elsewhere classified: Secondary | ICD-10-CM | POA: Diagnosis not present

## 2015-04-09 DIAGNOSIS — M6281 Muscle weakness (generalized): Secondary | ICD-10-CM | POA: Diagnosis not present

## 2015-04-11 DIAGNOSIS — Z96611 Presence of right artificial shoulder joint: Secondary | ICD-10-CM | POA: Diagnosis not present

## 2015-04-11 DIAGNOSIS — M6281 Muscle weakness (generalized): Secondary | ICD-10-CM | POA: Diagnosis not present

## 2015-04-11 DIAGNOSIS — M25611 Stiffness of right shoulder, not elsewhere classified: Secondary | ICD-10-CM | POA: Diagnosis not present

## 2015-04-16 DIAGNOSIS — M25611 Stiffness of right shoulder, not elsewhere classified: Secondary | ICD-10-CM | POA: Diagnosis not present

## 2015-04-16 DIAGNOSIS — Z96611 Presence of right artificial shoulder joint: Secondary | ICD-10-CM | POA: Diagnosis not present

## 2015-04-16 DIAGNOSIS — M6281 Muscle weakness (generalized): Secondary | ICD-10-CM | POA: Diagnosis not present

## 2015-04-23 DIAGNOSIS — Z96611 Presence of right artificial shoulder joint: Secondary | ICD-10-CM | POA: Diagnosis not present

## 2015-04-23 DIAGNOSIS — M6281 Muscle weakness (generalized): Secondary | ICD-10-CM | POA: Diagnosis not present

## 2015-04-23 DIAGNOSIS — M25611 Stiffness of right shoulder, not elsewhere classified: Secondary | ICD-10-CM | POA: Diagnosis not present

## 2015-04-30 DIAGNOSIS — Z96611 Presence of right artificial shoulder joint: Secondary | ICD-10-CM | POA: Diagnosis not present

## 2015-04-30 DIAGNOSIS — M25611 Stiffness of right shoulder, not elsewhere classified: Secondary | ICD-10-CM | POA: Diagnosis not present

## 2015-04-30 DIAGNOSIS — R29898 Other symptoms and signs involving the musculoskeletal system: Secondary | ICD-10-CM | POA: Diagnosis not present

## 2015-05-07 DIAGNOSIS — Z96611 Presence of right artificial shoulder joint: Secondary | ICD-10-CM | POA: Diagnosis not present

## 2015-05-07 DIAGNOSIS — M25611 Stiffness of right shoulder, not elsewhere classified: Secondary | ICD-10-CM | POA: Diagnosis not present

## 2015-05-07 DIAGNOSIS — R29898 Other symptoms and signs involving the musculoskeletal system: Secondary | ICD-10-CM | POA: Diagnosis not present

## 2015-05-21 DIAGNOSIS — M25611 Stiffness of right shoulder, not elsewhere classified: Secondary | ICD-10-CM | POA: Diagnosis not present

## 2015-05-21 DIAGNOSIS — R29898 Other symptoms and signs involving the musculoskeletal system: Secondary | ICD-10-CM | POA: Diagnosis not present

## 2015-05-21 DIAGNOSIS — Z96611 Presence of right artificial shoulder joint: Secondary | ICD-10-CM | POA: Diagnosis not present

## 2015-05-30 DIAGNOSIS — Z96611 Presence of right artificial shoulder joint: Secondary | ICD-10-CM | POA: Diagnosis not present

## 2015-05-30 DIAGNOSIS — R29898 Other symptoms and signs involving the musculoskeletal system: Secondary | ICD-10-CM | POA: Diagnosis not present

## 2015-05-30 DIAGNOSIS — M25611 Stiffness of right shoulder, not elsewhere classified: Secondary | ICD-10-CM | POA: Diagnosis not present

## 2015-06-04 DIAGNOSIS — M25611 Stiffness of right shoulder, not elsewhere classified: Secondary | ICD-10-CM | POA: Diagnosis not present

## 2015-06-04 DIAGNOSIS — R29898 Other symptoms and signs involving the musculoskeletal system: Secondary | ICD-10-CM | POA: Diagnosis not present

## 2015-06-04 DIAGNOSIS — Z96611 Presence of right artificial shoulder joint: Secondary | ICD-10-CM | POA: Diagnosis not present

## 2015-06-11 DIAGNOSIS — I10 Essential (primary) hypertension: Secondary | ICD-10-CM | POA: Diagnosis not present

## 2015-06-11 DIAGNOSIS — Z96611 Presence of right artificial shoulder joint: Secondary | ICD-10-CM | POA: Diagnosis not present

## 2015-06-11 DIAGNOSIS — Z87891 Personal history of nicotine dependence: Secondary | ICD-10-CM | POA: Diagnosis not present

## 2015-06-11 DIAGNOSIS — Z7984 Long term (current) use of oral hypoglycemic drugs: Secondary | ICD-10-CM | POA: Diagnosis not present

## 2015-06-11 DIAGNOSIS — Z885 Allergy status to narcotic agent status: Secondary | ICD-10-CM | POA: Diagnosis not present

## 2015-06-11 DIAGNOSIS — Z882 Allergy status to sulfonamides status: Secondary | ICD-10-CM | POA: Diagnosis not present

## 2015-06-11 DIAGNOSIS — Z7982 Long term (current) use of aspirin: Secondary | ICD-10-CM | POA: Diagnosis not present

## 2015-06-11 DIAGNOSIS — Z471 Aftercare following joint replacement surgery: Secondary | ICD-10-CM | POA: Diagnosis not present

## 2015-06-11 DIAGNOSIS — M19011 Primary osteoarthritis, right shoulder: Secondary | ICD-10-CM | POA: Diagnosis not present

## 2015-06-11 DIAGNOSIS — Z79899 Other long term (current) drug therapy: Secondary | ICD-10-CM | POA: Diagnosis not present

## 2015-06-11 DIAGNOSIS — E119 Type 2 diabetes mellitus without complications: Secondary | ICD-10-CM | POA: Diagnosis not present

## 2015-06-12 DIAGNOSIS — R29898 Other symptoms and signs involving the musculoskeletal system: Secondary | ICD-10-CM | POA: Diagnosis not present

## 2015-06-12 DIAGNOSIS — Z96611 Presence of right artificial shoulder joint: Secondary | ICD-10-CM | POA: Diagnosis not present

## 2015-06-12 DIAGNOSIS — M25611 Stiffness of right shoulder, not elsewhere classified: Secondary | ICD-10-CM | POA: Diagnosis not present

## 2015-06-18 DIAGNOSIS — R29898 Other symptoms and signs involving the musculoskeletal system: Secondary | ICD-10-CM | POA: Diagnosis not present

## 2015-06-18 DIAGNOSIS — Z96611 Presence of right artificial shoulder joint: Secondary | ICD-10-CM | POA: Diagnosis not present

## 2015-06-18 DIAGNOSIS — M25611 Stiffness of right shoulder, not elsewhere classified: Secondary | ICD-10-CM | POA: Diagnosis not present

## 2015-06-28 DIAGNOSIS — R29898 Other symptoms and signs involving the musculoskeletal system: Secondary | ICD-10-CM | POA: Diagnosis not present

## 2015-06-28 DIAGNOSIS — Z96611 Presence of right artificial shoulder joint: Secondary | ICD-10-CM | POA: Diagnosis not present

## 2015-06-28 DIAGNOSIS — M25611 Stiffness of right shoulder, not elsewhere classified: Secondary | ICD-10-CM | POA: Diagnosis not present

## 2015-07-25 DIAGNOSIS — E663 Overweight: Secondary | ICD-10-CM | POA: Diagnosis not present

## 2015-07-25 DIAGNOSIS — E876 Hypokalemia: Secondary | ICD-10-CM | POA: Diagnosis not present

## 2015-07-25 DIAGNOSIS — J309 Allergic rhinitis, unspecified: Secondary | ICD-10-CM | POA: Diagnosis not present

## 2015-07-25 DIAGNOSIS — Z6829 Body mass index (BMI) 29.0-29.9, adult: Secondary | ICD-10-CM | POA: Diagnosis not present

## 2015-07-25 DIAGNOSIS — E1129 Type 2 diabetes mellitus with other diabetic kidney complication: Secondary | ICD-10-CM | POA: Diagnosis not present

## 2015-07-25 DIAGNOSIS — M19011 Primary osteoarthritis, right shoulder: Secondary | ICD-10-CM | POA: Diagnosis not present

## 2015-07-25 DIAGNOSIS — M7022 Olecranon bursitis, left elbow: Secondary | ICD-10-CM | POA: Diagnosis not present

## 2015-07-25 DIAGNOSIS — N183 Chronic kidney disease, stage 3 (moderate): Secondary | ICD-10-CM | POA: Diagnosis not present

## 2015-07-25 DIAGNOSIS — I129 Hypertensive chronic kidney disease with stage 1 through stage 4 chronic kidney disease, or unspecified chronic kidney disease: Secondary | ICD-10-CM | POA: Diagnosis not present

## 2015-07-25 DIAGNOSIS — Z96611 Presence of right artificial shoulder joint: Secondary | ICD-10-CM | POA: Diagnosis not present

## 2015-08-06 DIAGNOSIS — Z1231 Encounter for screening mammogram for malignant neoplasm of breast: Secondary | ICD-10-CM | POA: Diagnosis not present

## 2015-08-06 DIAGNOSIS — Z803 Family history of malignant neoplasm of breast: Secondary | ICD-10-CM | POA: Diagnosis not present

## 2015-10-16 DIAGNOSIS — E1129 Type 2 diabetes mellitus with other diabetic kidney complication: Secondary | ICD-10-CM | POA: Diagnosis not present

## 2015-10-16 DIAGNOSIS — Z6829 Body mass index (BMI) 29.0-29.9, adult: Secondary | ICD-10-CM | POA: Diagnosis not present

## 2015-10-16 DIAGNOSIS — N183 Chronic kidney disease, stage 3 (moderate): Secondary | ICD-10-CM | POA: Diagnosis not present

## 2015-11-26 DIAGNOSIS — M19011 Primary osteoarthritis, right shoulder: Secondary | ICD-10-CM | POA: Diagnosis not present

## 2015-11-26 DIAGNOSIS — E213 Hyperparathyroidism, unspecified: Secondary | ICD-10-CM | POA: Diagnosis not present

## 2015-11-26 DIAGNOSIS — E1129 Type 2 diabetes mellitus with other diabetic kidney complication: Secondary | ICD-10-CM | POA: Diagnosis not present

## 2015-11-26 DIAGNOSIS — M7022 Olecranon bursitis, left elbow: Secondary | ICD-10-CM | POA: Diagnosis not present

## 2015-11-26 DIAGNOSIS — R911 Solitary pulmonary nodule: Secondary | ICD-10-CM | POA: Diagnosis not present

## 2015-11-26 DIAGNOSIS — E784 Other hyperlipidemia: Secondary | ICD-10-CM | POA: Diagnosis not present

## 2015-11-26 DIAGNOSIS — E669 Obesity, unspecified: Secondary | ICD-10-CM | POA: Diagnosis not present

## 2015-11-26 DIAGNOSIS — I10 Essential (primary) hypertension: Secondary | ICD-10-CM | POA: Diagnosis not present

## 2015-11-26 DIAGNOSIS — R269 Unspecified abnormalities of gait and mobility: Secondary | ICD-10-CM | POA: Diagnosis not present

## 2015-11-26 DIAGNOSIS — Z23 Encounter for immunization: Secondary | ICD-10-CM | POA: Diagnosis not present

## 2015-11-26 DIAGNOSIS — J309 Allergic rhinitis, unspecified: Secondary | ICD-10-CM | POA: Diagnosis not present

## 2015-11-26 DIAGNOSIS — M1990 Unspecified osteoarthritis, unspecified site: Secondary | ICD-10-CM | POA: Diagnosis not present

## 2015-12-17 DIAGNOSIS — Z471 Aftercare following joint replacement surgery: Secondary | ICD-10-CM | POA: Diagnosis not present

## 2015-12-17 DIAGNOSIS — S62631A Displaced fracture of distal phalanx of left index finger, initial encounter for closed fracture: Secondary | ICD-10-CM | POA: Diagnosis not present

## 2015-12-17 DIAGNOSIS — M19042 Primary osteoarthritis, left hand: Secondary | ICD-10-CM | POA: Diagnosis not present

## 2015-12-17 DIAGNOSIS — S63631A Sprain of interphalangeal joint of left index finger, initial encounter: Secondary | ICD-10-CM | POA: Diagnosis not present

## 2015-12-17 DIAGNOSIS — Z96611 Presence of right artificial shoulder joint: Secondary | ICD-10-CM | POA: Diagnosis not present

## 2015-12-17 DIAGNOSIS — M19011 Primary osteoarthritis, right shoulder: Secondary | ICD-10-CM | POA: Diagnosis not present

## 2016-01-29 DIAGNOSIS — I10 Essential (primary) hypertension: Secondary | ICD-10-CM | POA: Diagnosis not present

## 2016-01-29 DIAGNOSIS — R110 Nausea: Secondary | ICD-10-CM | POA: Diagnosis not present

## 2016-01-29 DIAGNOSIS — Z6830 Body mass index (BMI) 30.0-30.9, adult: Secondary | ICD-10-CM | POA: Diagnosis not present

## 2016-01-29 DIAGNOSIS — R509 Fever, unspecified: Secondary | ICD-10-CM | POA: Diagnosis not present

## 2016-01-29 DIAGNOSIS — R1084 Generalized abdominal pain: Secondary | ICD-10-CM | POA: Diagnosis not present

## 2016-01-29 DIAGNOSIS — R0789 Other chest pain: Secondary | ICD-10-CM | POA: Diagnosis not present

## 2016-03-25 DIAGNOSIS — Z961 Presence of intraocular lens: Secondary | ICD-10-CM | POA: Diagnosis not present

## 2016-03-25 DIAGNOSIS — H26492 Other secondary cataract, left eye: Secondary | ICD-10-CM | POA: Diagnosis not present

## 2016-03-25 DIAGNOSIS — H18413 Arcus senilis, bilateral: Secondary | ICD-10-CM | POA: Diagnosis not present

## 2016-03-25 DIAGNOSIS — H02411 Mechanical ptosis of right eyelid: Secondary | ICD-10-CM | POA: Diagnosis not present

## 2016-03-25 DIAGNOSIS — H353130 Nonexudative age-related macular degeneration, bilateral, stage unspecified: Secondary | ICD-10-CM | POA: Diagnosis not present

## 2016-04-11 NOTE — Progress Notes (Signed)
This encounter was created in error - please disregard.

## 2016-05-31 ENCOUNTER — Emergency Department (HOSPITAL_COMMUNITY): Payer: Medicare Other

## 2016-05-31 ENCOUNTER — Encounter (HOSPITAL_COMMUNITY): Payer: Self-pay

## 2016-05-31 ENCOUNTER — Inpatient Hospital Stay (HOSPITAL_COMMUNITY)
Admission: EM | Admit: 2016-05-31 | Discharge: 2016-06-06 | DRG: 393 | Disposition: A | Discharge status: Skilled Nursing Facility | Payer: Medicare Other | Attending: Family Medicine | Admitting: Family Medicine

## 2016-05-31 DIAGNOSIS — K921 Melena: Secondary | ICD-10-CM | POA: Diagnosis not present

## 2016-05-31 DIAGNOSIS — R278 Other lack of coordination: Secondary | ICD-10-CM | POA: Diagnosis not present

## 2016-05-31 DIAGNOSIS — J449 Chronic obstructive pulmonary disease, unspecified: Secondary | ICD-10-CM | POA: Diagnosis not present

## 2016-05-31 DIAGNOSIS — R41841 Cognitive communication deficit: Secondary | ICD-10-CM | POA: Diagnosis not present

## 2016-05-31 DIAGNOSIS — J810 Acute pulmonary edema: Secondary | ICD-10-CM | POA: Diagnosis present

## 2016-05-31 DIAGNOSIS — Z79899 Other long term (current) drug therapy: Secondary | ICD-10-CM | POA: Diagnosis not present

## 2016-05-31 DIAGNOSIS — K5229 Other allergic and dietetic gastroenteritis and colitis: Secondary | ICD-10-CM | POA: Diagnosis not present

## 2016-05-31 DIAGNOSIS — Z87891 Personal history of nicotine dependence: Secondary | ICD-10-CM

## 2016-05-31 DIAGNOSIS — E1165 Type 2 diabetes mellitus with hyperglycemia: Secondary | ICD-10-CM | POA: Diagnosis present

## 2016-05-31 DIAGNOSIS — M1990 Unspecified osteoarthritis, unspecified site: Secondary | ICD-10-CM | POA: Diagnosis present

## 2016-05-31 DIAGNOSIS — J811 Chronic pulmonary edema: Secondary | ICD-10-CM | POA: Diagnosis not present

## 2016-05-31 DIAGNOSIS — K559 Vascular disorder of intestine, unspecified: Principal | ICD-10-CM | POA: Diagnosis present

## 2016-05-31 DIAGNOSIS — J90 Pleural effusion, not elsewhere classified: Secondary | ICD-10-CM | POA: Diagnosis not present

## 2016-05-31 DIAGNOSIS — I10 Essential (primary) hypertension: Secondary | ICD-10-CM | POA: Diagnosis not present

## 2016-05-31 DIAGNOSIS — Z7982 Long term (current) use of aspirin: Secondary | ICD-10-CM | POA: Diagnosis not present

## 2016-05-31 DIAGNOSIS — Z9049 Acquired absence of other specified parts of digestive tract: Secondary | ICD-10-CM

## 2016-05-31 DIAGNOSIS — E785 Hyperlipidemia, unspecified: Secondary | ICD-10-CM | POA: Diagnosis present

## 2016-05-31 DIAGNOSIS — K529 Noninfective gastroenteritis and colitis, unspecified: Secondary | ICD-10-CM | POA: Diagnosis not present

## 2016-05-31 DIAGNOSIS — E876 Hypokalemia: Secondary | ICD-10-CM | POA: Diagnosis not present

## 2016-05-31 DIAGNOSIS — R2681 Unsteadiness on feet: Secondary | ICD-10-CM | POA: Diagnosis not present

## 2016-05-31 DIAGNOSIS — R54 Age-related physical debility: Secondary | ICD-10-CM | POA: Diagnosis not present

## 2016-05-31 DIAGNOSIS — E118 Type 2 diabetes mellitus with unspecified complications: Secondary | ICD-10-CM

## 2016-05-31 DIAGNOSIS — Z885 Allergy status to narcotic agent status: Secondary | ICD-10-CM | POA: Diagnosis not present

## 2016-05-31 DIAGNOSIS — R1030 Lower abdominal pain, unspecified: Secondary | ICD-10-CM | POA: Diagnosis not present

## 2016-05-31 DIAGNOSIS — D62 Acute posthemorrhagic anemia: Secondary | ICD-10-CM | POA: Diagnosis present

## 2016-05-31 DIAGNOSIS — Z882 Allergy status to sulfonamides status: Secondary | ICD-10-CM | POA: Diagnosis not present

## 2016-05-31 DIAGNOSIS — R509 Fever, unspecified: Secondary | ICD-10-CM

## 2016-05-31 DIAGNOSIS — R05 Cough: Secondary | ICD-10-CM | POA: Diagnosis not present

## 2016-05-31 DIAGNOSIS — Z7984 Long term (current) use of oral hypoglycemic drugs: Secondary | ICD-10-CM | POA: Diagnosis not present

## 2016-05-31 DIAGNOSIS — M6281 Muscle weakness (generalized): Secondary | ICD-10-CM | POA: Diagnosis not present

## 2016-05-31 DIAGNOSIS — E119 Type 2 diabetes mellitus without complications: Secondary | ICD-10-CM

## 2016-05-31 DIAGNOSIS — R911 Solitary pulmonary nodule: Secondary | ICD-10-CM | POA: Diagnosis present

## 2016-05-31 DIAGNOSIS — E1169 Type 2 diabetes mellitus with other specified complication: Secondary | ICD-10-CM | POA: Diagnosis present

## 2016-05-31 DIAGNOSIS — R0602 Shortness of breath: Secondary | ICD-10-CM | POA: Diagnosis not present

## 2016-05-31 HISTORY — DX: Noninfective gastroenteritis and colitis, unspecified: K52.9

## 2016-05-31 LAB — TYPE AND SCREEN
ABO/RH(D): O NEG
Antibody Screen: NEGATIVE

## 2016-05-31 LAB — COMPREHENSIVE METABOLIC PANEL
ALK PHOS: 53 U/L (ref 38–126)
ALT: 20 U/L (ref 14–54)
AST: 28 U/L (ref 15–41)
Albumin: 4.4 g/dL (ref 3.5–5.0)
Anion gap: 13 (ref 5–15)
BUN: 23 mg/dL — ABNORMAL HIGH (ref 6–20)
CALCIUM: 9.6 mg/dL (ref 8.9–10.3)
CO2: 22 mmol/L (ref 22–32)
CREATININE: 0.98 mg/dL (ref 0.44–1.00)
Chloride: 102 mmol/L (ref 101–111)
GFR, EST AFRICAN AMERICAN: 58 mL/min — AB (ref >60)
GFR, EST NON AFRICAN AMERICAN: 50 mL/min — AB (ref >60)
Glucose, Bld: 213 mg/dL — ABNORMAL HIGH (ref 65–99)
Potassium: 4.2 mmol/L (ref 3.5–5.1)
Sodium: 137 mmol/L (ref 135–145)
TOTAL PROTEIN: 7.5 g/dL (ref 6.5–8.1)
Total Bilirubin: 0.7 mg/dL (ref 0.3–1.2)

## 2016-05-31 LAB — CBC
HCT: 41.2 % (ref 36.0–46.0)
Hemoglobin: 14.3 g/dL (ref 12.0–15.0)
MCH: 32.3 pg (ref 26.0–34.0)
MCHC: 34.7 g/dL (ref 30.0–36.0)
MCV: 93 fL (ref 78.0–100.0)
PLATELETS: 223 10*3/uL (ref 150–400)
RBC: 4.43 MIL/uL (ref 3.87–5.11)
RDW: 12.3 % (ref 11.5–15.5)
WBC: 10.7 10*3/uL — AB (ref 4.0–10.5)

## 2016-05-31 LAB — ABO/RH: ABO/RH(D): O NEG

## 2016-05-31 LAB — POC OCCULT BLOOD, ED: FECAL OCCULT BLD: POSITIVE — AB

## 2016-05-31 MED ORDER — CIPROFLOXACIN IN D5W 400 MG/200ML IV SOLN
400.0000 mg | Freq: Once | INTRAVENOUS | Status: AC
  Administered 2016-05-31: 400 mg via INTRAVENOUS
  Filled 2016-05-31: qty 200

## 2016-05-31 MED ORDER — FENTANYL CITRATE (PF) 100 MCG/2ML IJ SOLN
25.0000 ug | INTRAMUSCULAR | Status: DC | PRN
  Administered 2016-05-31: 25 ug via INTRAVENOUS
  Filled 2016-05-31: qty 2

## 2016-05-31 MED ORDER — ONDANSETRON HCL 4 MG/2ML IJ SOLN
4.0000 mg | Freq: Once | INTRAMUSCULAR | Status: AC
  Administered 2016-05-31: 4 mg via INTRAVENOUS
  Filled 2016-05-31: qty 2

## 2016-05-31 MED ORDER — IOPAMIDOL (ISOVUE-300) INJECTION 61%
INTRAVENOUS | Status: AC
  Administered 2016-05-31: 100 mL
  Filled 2016-05-31: qty 100

## 2016-05-31 MED ORDER — SODIUM CHLORIDE 0.9 % IV BOLUS (SEPSIS)
500.0000 mL | Freq: Once | INTRAVENOUS | Status: AC
  Administered 2016-05-31: 500 mL via INTRAVENOUS

## 2016-05-31 MED ORDER — METRONIDAZOLE IN NACL 5-0.79 MG/ML-% IV SOLN
500.0000 mg | Freq: Once | INTRAVENOUS | Status: AC
  Administered 2016-05-31: 500 mg via INTRAVENOUS
  Filled 2016-05-31: qty 100

## 2016-05-31 MED ORDER — SODIUM CHLORIDE 0.9 % IV SOLN
INTRAVENOUS | Status: DC
  Administered 2016-05-31: 21:00:00 via INTRAVENOUS

## 2016-05-31 NOTE — ED Notes (Signed)
Patient transported to CT scan . 

## 2016-05-31 NOTE — ED Notes (Signed)
Pharmacist notified on pt.'s Flagyl and Cipro IV orders ( not showing at [place]) .

## 2016-05-31 NOTE — ED Provider Notes (Signed)
[facility] DEPT Provider Note   CSN: [id number] Arrival date & time: 05/31/16  1751     History   Chief Complaint No chief complaint on file.   HPI Allison Duffy is a 83 y.o. female.  She presents for evaluation of nausea vomiting and diarrhea followed by rectal bleeding and abdominal pain.  Symptoms started abruptly several hours ago.  She states that she "felt great" earlier this morning.  No known sick contacts, or abnormal food ingestions.  No prior similar problem.  She is taking her usual medications.  There are no other known modifying factors.  HPI  Past Medical History:  Diagnosis Date  . Arthritis    "scattered joints" (07/29/2013)  . Chest pain   . Gout attack ?1980's  . Hyperlipidemia   . Hypertension   . PONV (postoperative nausea and vomiting)   . Type II diabetes mellitus [facility])     Patient Active Problem List   Diagnosis Date Noted  . Primary osteoarthritis of right shoulder 12/27/2014  . Pulmonary nodule 06/23/2014  . Viral gastroenteritis 06/23/2014  . Viral gastritis 06/23/2014  . Septic olecranon bursitis 07/29/2013  . Essential hypertension 07/05/2013  . Hyperlipidemia 07/05/2013  . Type 2 diabetes mellitus ([place]) 07/05/2013  . Chest pain 07/05/2013    Past Surgical History:  Procedure Laterality Date  . APPENDECTOMY    . CARPAL TUNNEL RELEASE Right   . CATARACT EXTRACTION W/ INTRAOCULAR LENS  IMPLANT, BILATERAL Bilateral   . CHOLECYSTECTOMY    . KNEE ARTHROSCOPY Right   . OLECRANON BURSECTOMY Left 07/29/2013   "in ER"  . PARATHYROIDECTOMY    . TONSILLECTOMY    . VAGINAL HYSTERECTOMY      OB History    No data available       Home Medications    Prior to Admission medications   Medication Sig Start Date End Date Taking? Authorizing Provider  acetaminophen (TYLENOL) 500 MG tablet Take 1,000 mg by mouth every 6 (six) hours as needed.   Yes [provider]  aluminum-magnesium hydroxide-simethicone (MAALOX) 200-200-20  MG/5ML SUSP Take 30 mLs by mouth 4 (four) times daily -  before meals and at bedtime.   Yes [provider]  amLODipine (NORVASC) 10 MG tablet Take 10 mg by mouth daily.   Yes [provider]  ascorbic acid (VITAMIN C) 500 MG tablet Take 500 mg by mouth 3 (three) times daily.   Yes [provider]  aspirin 81 MG tablet Take 81 mg by mouth 2 (two) times daily.    Yes [provider]  Calcium Carbonate (CALCIUM 600 PO) Take 1,200 mg by mouth daily.   Yes [provider]  cholecalciferol (VITAMIN D) 1000 UNITS tablet Take 1,000 Units by mouth daily.   Yes [provider]  cloNIDine (CATAPRES) 0.1 MG tablet Take 0.1 mg by mouth 2 (two) times daily.   Yes [provider]  furosemide (LASIX) 40 MG tablet Take 40 mg by mouth daily.   Yes [provider]  glipiZIDE (GLUCOTROL XL) 10 MG 24 hr tablet Take 10 mg by mouth 2 (two) times daily.    Yes [provider]  Glucosamine HCl (GLUCOSAMINE PO) Take 2,000 mg by mouth daily.   Yes [provider]  lisinopril (PRINIVIL,ZESTRIL) 40 MG tablet Take 40 mg by mouth daily.   Yes [provider]  magnesium oxide (MAG-OX) 400 MG tablet Take 400 mg by mouth daily.   Yes [provider]  metFORMIN (GLUCOPHAGE) 500  MG tablet Take 500 mg by mouth daily with breakfast.    Yes [provider]  metoprolol succinate (TOPROL-XL) 50 MG 24 hr tablet Take 50 mg by mouth 2 (two) times daily. 05/05/16  Yes [provider]  Multiple Vitamin (MULTIVITAMIN WITH MINERALS) TABS Take 1 tablet by mouth 2 (two) times daily. Vitality Multivitamin & mineral   Yes [provider]  Potassium 99 MG TABS Take 99 mg by mouth daily.   Yes [provider]  pravastatin (PRAVACHOL) 20 MG tablet Take 20 mg by mouth daily.   Yes [provider]  sitaGLIPtin (JANUVIA) 100 MG tablet Take 100 mg by mouth daily.   Yes [provider]  vitamin E  400 UNIT capsule Take 400 Units by mouth daily.   Yes [provider]  traMADol (ULTRAM) 50 MG tablet Take 1 tablet (50 mg total) by mouth every 6 (six) hours as needed for moderate pain or severe pain. 08/02/13   Burnard Bunting, MD    Family History Family History  Problem Relation Age of Onset  . Hypertension Father   . Heart disease Father   . Heart disease Mother   . Diabetes Mother   . Hypertension Mother   . Heart disease Brother     both brothers  . Diabetes Brother   . Hypertension Brother     Social History Social History  Substance Use Topics  . Smoking status: Former Smoker    Years: 5.00    Types: Cigarettes    Quit date: 07/05/1953  . Smokeless tobacco: Never Used  . Alcohol use No     Allergies   Codeine; Hydrocodone; and Sulfa antibiotics   Review of Systems Review of Systems  All other systems reviewed and are negative.    Physical Exam Updated Vital Signs BP (!) 171/73 (BP Location: Left Arm)   Pulse 74   Temp 98.3 F (36.8 C)   Resp 16   Ht 5\' 6"  (1.676 m)   Wt 180 lb (81.6 kg)   SpO2 99%   BMI 29.05 kg/m   Physical Exam  Constitutional: She is oriented to person, place, and time. She appears well-developed.  Elderly, frail  HENT:  Head: Normocephalic and atraumatic.  Eyes: Conjunctivae and EOM are normal. Pupils are equal, round, and reactive to light.  Neck: Normal range of motion and phonation normal. Neck supple.  Cardiovascular: Normal rate and regular rhythm.   Pulmonary/Chest: Effort normal and breath sounds normal. She exhibits no tenderness.  Abdominal: Soft. She exhibits no distension and no mass. There is tenderness (Left and right lower quadrants, mild). There is no rebound and no guarding. No hernia.  Musculoskeletal: Normal range of motion.  Neurological: She is alert and oriented to person, place, and time. She exhibits normal muscle tone.  Skin: Skin is warm and dry.  Psychiatric: She has a normal mood and  affect. Her behavior is normal. Judgment and thought content normal.  Nursing note and vitals reviewed.    ED Treatments / Results  Labs (all labs ordered are listed, but only abnormal results are displayed) Labs Reviewed  COMPREHENSIVE METABOLIC PANEL - Abnormal; Notable for the following:       Result Value   Glucose, Bld 213 (*)    BUN 23 (*)    GFR calc non Af Amer 50 (*)    GFR calc Af Amer 58 (*)    All other components within normal limits  CBC - Abnormal; Notable for the  following:    WBC 10.7 (*)    All other components within normal limits  POC OCCULT BLOOD, ED - Abnormal; Notable for the following:    Fecal Occult Bld POSITIVE (*)    All other components within normal limits  TYPE AND SCREEN  ABO/RH    EKG  EKG Interpretation None       Radiology Ct Abdomen Pelvis W Contrast  Result Date: 05/31/2016 CLINICAL DATA:  Lower abdominal pain in cramping with multiple episodes of vomiting and diarrhea frank blood in the stool EXAM: CT ABDOMEN AND PELVIS WITH CONTRAST TECHNIQUE: Multidetector CT imaging of the abdomen and pelvis was performed using the standard protocol following bolus administration of intravenous contrast. CONTRAST:  164mL ISOVUE-300 IOPAMIDOL (ISOVUE-300) INJECTION 61% COMPARISON:  06/23/2014 FINDINGS: Lower chest: 11 mm pulmonary nodule in the right lower lobe unchanged compared with 2016. Mild dependent atelectasis. No focal consolidation or pleural effusion. Coronary artery calcification. Hepatobiliary: Postsurgical clips in the gallbladder fossa. Stable mild intra and extrahepatic biliary dilatation, likely due to postcholecystectomy change. Pancreas: Unremarkable. No pancreatic ductal dilatation or surrounding inflammatory changes. Spleen: Normal in size without focal abnormality. Multiple calcified granuloma Adrenals/Urinary Tract: Adrenal glands are within normal limits. No hydronephrosis. Bladder within normal limits Stomach/Bowel: Stomach is  nonenlarged. No dilated small bowel. Appendix is non identified. Mild wall thickening of the transverse colon, splenic flexure, and descending colon, and rectosigmoid colon with mild inflammation in the fat surrounding the descending and sigmoid colon. No pneumatosis. Few diverticula are present. Vascular/Lymphatic: Aortic atherosclerosis. No aneurysm. No significantly enlarged lymph nodes Reproductive: Status post hysterectomy. No adnexal masses. Other: No free air or free fluid. Musculoskeletal: No acute or suspicious bone lesion. Degenerative changes. IMPRESSION: 1. There is wall thickening involving the transverse: , descending colon and rectosigmoid colon, consistent with colitis of infectious, inflammatory, or ischemic etiologies. No pneumatosis. No evidence for a bowel obstruction. 2. 11 mm pulmonary nodule in the right lower lobe, stable since 2016. 3. Stable mild intra and extrahepatic biliary dilatation, suspected to be secondary to post cholecystectomy changes. Electronically Signed   By: Donavan Foil M.D.   On: 05/31/2016 21:05    Procedures Procedures (including critical care time)  Medications Ordered in ED Medications  0.9 %  sodium chloride infusion ( Intravenous New Bag/Given 05/31/16 2129)  fentaNYL (SUBLIMAZE) injection 25 mcg (25 mcg Intravenous Given 05/31/16 2007)  metroNIDAZOLE (FLAGYL) IVPB 500 mg (not administered)  ciprofloxacin (CIPRO) IVPB 400 mg (400 mg Intravenous New Bag/Given 05/31/16 2206)  sodium chloride 0.9 % bolus 500 mL (0 mLs Intravenous Stopped 05/31/16 2125)  ondansetron (ZOFRAN) injection 4 mg (4 mg Intravenous Given 05/31/16 2007)  iopamidol (ISOVUE-300) 61 % injection (100 mLs  Contrast Given 05/31/16 2033)     Initial Impression / Assessment and Plan / ED Course  I have reviewed the triage vital signs and the nursing notes.  Pertinent labs & imaging results that were available during my care of the patient were reviewed by me and considered in my medical  decision making (see chart for details).  Clinical Course as of May 31 2249  Sat May 31, 2016  1930 MCHC: 34.7 [EW]  1931 MCHC: 34.7 [EW]  1931 MCHC: 34.7 [EW]  1931 MCHC: 34.7 [EW]  1931 MCHC: 34.7 [EW]    Clinical Course User Index [EW] Daleen Bo, MD    Medications  0.9 %  sodium chloride infusion ( Intravenous New Bag/Given 05/31/16 2129)  fentaNYL (SUBLIMAZE) injection 25 mcg (25 mcg Intravenous Given  05/31/16 2007)  metroNIDAZOLE (FLAGYL) IVPB 500 mg (not administered)  ciprofloxacin (CIPRO) IVPB 400 mg (400 mg Intravenous New Bag/Given 05/31/16 2206)  sodium chloride 0.9 % bolus 500 mL (0 mLs Intravenous Stopped 05/31/16 2125)  ondansetron (ZOFRAN) injection 4 mg (4 mg Intravenous Given 05/31/16 2007)  iopamidol (ISOVUE-300) 61 % injection (100 mLs  Contrast Given 05/31/16 2033)    Patient Vitals for the past 24 hrs:  BP Temp Pulse Resp SpO2 Height Weight  05/31/16 2053 (!) 171/73 - 74 16 99 % - -  05/31/16 2008 (!) 219/82 - 68 16 98 % - -  05/31/16 1800 - - - - - 5\' 6"  (1.676 m) 180 lb (81.6 kg)  05/31/16 1759 (!) 187/83 98.3 F (36.8 C) 78 18 96 % - -    10:37 PM Reevaluation with update and discussion. After initial assessment and treatment, an updated evaluation reveals patient continues to be uncomfortable and is currently sitting on the commode and has passed a small amount of blood again.  She states that her abdomen still hurts.  Repeat vital signs indicate improved blood pressure.  She reports that her last colonoscopy was in 1988.  Findings discussed with patient and family member, all questions answered. Sharnell Knight L    10:53 PM-Consult complete with hospitalist. Patient case explained and discussed.  She agrees to admit patient for further evaluation and treatment. Call ended at 49: 05  Final Clinical Impressions(s) / ED Diagnoses   Final diagnoses:  Colitis    Abdominal pain with diarrhea, and rectal bleeding.  CT images are consistent with nonspecific  colitis.  Patient is nontoxic, with a somewhat elevated blood pressure.  Doubt impending vascular collapse.  Doubt bacteremia or sepsis.  Patient continues to be uncomfortable in the emergency department after treatment, with IV fluids, IV antibiotics and IV analgesia.  She has continued to pass a small amount of blood.  Therefore, the patient will be admitted for observation, and serial evaluations.  Nursing Notes Reviewed/ Care Coordinated Applicable Imaging Reviewed Interpretation of Laboratory Data incorporated into ED treatment  Plan: Admit  New Prescriptions New Prescriptions   No medications on file     Daleen Bo, MD 05/31/16 2307

## 2016-05-31 NOTE — H&P (Signed)
History and Physical    Allison Duffy KXF:[id number] DOB: 12/03/1926 DOA: 05/31/2016  PCP: Burnard Bunting, MD  Patient coming from:  home  Chief Complaint:   Abdominal pain, bleeding rectally  HPI: Allison Duffy is a 83 y.o. female with medical history significant of HTN, HLD, NIDDM comes in with sudden onset of nausea, vomiting, and diarrhea with blood associated with lower abdominal cramps.  She denies any fevers or chills.  No sick contacts.  No colonoscopy since the 1980s.  Vomit bilious without any blood.  Pt found to have colitis, referred for admission for infection and gib.    Review of Systems: As per HPI otherwise 10 point review of systems negative.   Past Medical History:  Diagnosis Date  . Arthritis    "scattered joints" (07/29/2013)  . Chest pain   . Gout attack ?1980's  . Hyperlipidemia   . Hypertension   . PONV (postoperative nausea and vomiting)   . Type II diabetes mellitus ([place])     Past Surgical History:  Procedure Laterality Date  . APPENDECTOMY    . CARPAL TUNNEL RELEASE Right   . CATARACT EXTRACTION W/ INTRAOCULAR LENS  IMPLANT, BILATERAL Bilateral   . CHOLECYSTECTOMY    . KNEE ARTHROSCOPY Right   . OLECRANON BURSECTOMY Left 07/29/2013   "in ER"  . PARATHYROIDECTOMY    . TONSILLECTOMY    . VAGINAL HYSTERECTOMY       reports that she quit smoking about 62 years ago. Her smoking use included Cigarettes. She quit after 5.00 years of use. She has never used smokeless tobacco. She reports that she does not drink alcohol or use drugs.  Allergies  Allergen Reactions  . Codeine Nausea And Vomiting  . Hydrocodone     Nausea and vomiting  . Sulfa Antibiotics Itching and Nausea Only    Family History  Problem Relation Age of Onset  . Hypertension Father   . Heart disease Father   . Heart disease Mother   . Diabetes Mother   . Hypertension Mother   . Heart disease Brother     both brothers  . Diabetes Brother   . Hypertension Brother      Prior to Admission medications   Medication Sig Start Date End Date Taking? Authorizing Provider  acetaminophen (TYLENOL) 500 MG tablet Take 1,000 mg by mouth every 6 (six) hours as needed.   Yes [provider]  aluminum-magnesium hydroxide-simethicone (MAALOX) 200-200-20 MG/5ML SUSP Take 30 mLs by mouth 4 (four) times daily -  before meals and at bedtime.   Yes [provider]  amLODipine (NORVASC) 10 MG tablet Take 10 mg by mouth daily.   Yes [provider]  ascorbic acid (VITAMIN C) 500 MG tablet Take 500 mg by mouth 3 (three) times daily.   Yes [provider]  aspirin 81 MG tablet Take 81 mg by mouth 2 (two) times daily.    Yes [provider]  Calcium Carbonate (CALCIUM 600 PO) Take 1,200 mg by mouth daily.   Yes [provider]  cholecalciferol (VITAMIN D) 1000 UNITS tablet Take 1,000 Units by mouth daily.   Yes [provider]  cloNIDine (CATAPRES) 0.1 MG tablet Take 0.1 mg by mouth 2 (two) times daily.   Yes [provider]  furosemide (LASIX) 40 MG tablet Take 40 mg by mouth daily.   Yes [provider]  glipiZIDE (GLUCOTROL XL) 10 MG 24 hr tablet Take 10 mg by mouth 2 (two) times daily.  Yes [provider]  Glucosamine HCl (GLUCOSAMINE PO) Take 2,000 mg by mouth daily.   Yes [provider]  lisinopril (PRINIVIL,ZESTRIL) 40 MG tablet Take 40 mg by mouth daily.   Yes [provider]  magnesium oxide (MAG-OX) 400 MG tablet Take 400 mg by mouth daily.   Yes [provider]  metFORMIN (GLUCOPHAGE) 500 MG tablet Take 500 mg by mouth daily with breakfast.    Yes [provider]  metoprolol succinate (TOPROL-XL) 50 MG 24 hr tablet Take 50 mg by mouth 2 (two) times daily. 05/05/16  Yes [provider]  Multiple Vitamin (MULTIVITAMIN WITH MINERALS) TABS Take 1 tablet by mouth 2 (two) times daily. Vitality Multivitamin & mineral   Yes [provider]  Potassium 99 MG TABS Take 99 mg by mouth daily.   Yes [provider]  pravastatin (PRAVACHOL) 20 MG tablet Take 20 mg by mouth daily.   Yes [provider]  sitaGLIPtin (JANUVIA) 100 MG tablet Take 100 mg by mouth daily.   Yes [provider]  vitamin E 400 UNIT capsule Take 400 Units by mouth daily.   Yes [provider]  traMADol (ULTRAM) 50 MG tablet Take 1 tablet (50 mg total) by mouth every 6 (six) hours as needed for moderate pain or severe pain. 08/02/13   Burnard Bunting, MD    Physical Exam: Vitals:   05/31/16 2053 05/31/16 2100 05/31/16 2115 05/31/16 2259  BP: (!) 171/73 (!) 159/64 (!) 156/70 (!) 148/96  Pulse: 74 70 73 73  Resp: 16 (!) 8 10 18   Temp:      SpO2: 99% 97% 96% 97%  Weight:      Height:        Constitutional: NAD, calm, comfortable Vitals:   05/31/16 2053 05/31/16 2100 05/31/16 2115 05/31/16 2259  BP: (!) 171/73 (!) 159/64 (!) 156/70 (!) 148/96  Pulse: 74 70 73 73  Resp: 16 (!) 8 10 18   Temp:      SpO2: 99% 97% 96% 97%  Weight:      Height:       Eyes: PERRL, lids and conjunctivae normal ENMT: Mucous membranes are moist. Posterior pharynx clear of any exudate or lesions.Normal dentition.  Neck: normal, supple, no masses, no thyromegaly Respiratory: clear to auscultation bilaterally, no wheezing, no crackles. Normal respiratory effort. No accessory muscle use.  Cardiovascular: Regular rate and rhythm, no murmurs / rubs / gallops. No extremity edema. 2+ pedal pulses. No carotid bruits.  Abdomen: no tenderness, no masses palpated. No hepatosplenomegaly. Bowel sounds positive.  Musculoskeletal: no clubbing / cyanosis. No joint deformity upper and lower extremities. Good ROM, no contractures. Normal muscle tone.  Skin: no rashes, lesions, ulcers. No induration Neurologic: CN 2-12 grossly intact. Sensation intact, DTR normal. Strength 5/5 in all 4.  Psychiatric: Normal judgment and insight. Alert and oriented x 3. Normal  mood.    Labs on Admission: I have personally reviewed following labs and imaging studies  CBC:  Recent Labs Lab 05/31/16 1816  WBC 10.7*  HGB 14.3  HCT 41.2  MCV 93.0  PLT 062   Basic Metabolic Panel:  Recent Labs Lab 05/31/16 1816  NA 137  K 4.2  CL 102  CO2 22  GLUCOSE 213*  BUN 23*  CREATININE 0.98  CALCIUM 9.6   GFR: Estimated Creatinine Clearance: 43.5 mL/min (by C-G formula based on SCr of 0.98 mg/dL). Liver Function Tests:  Recent Labs Lab 05/31/16 1816  AST 28  ALT  20  ALKPHOS 53  BILITOT 0.7  PROT 7.5  ALBUMIN 4.4    Radiological Exams on Admission: Ct Abdomen Pelvis W Contrast  Result Date: 05/31/2016 CLINICAL DATA:  Lower abdominal pain in cramping with multiple episodes of vomiting and diarrhea frank blood in the stool EXAM: CT ABDOMEN AND PELVIS WITH CONTRAST TECHNIQUE: Multidetector CT imaging of the abdomen and pelvis was performed using the standard protocol following bolus administration of intravenous contrast. CONTRAST:  150mL ISOVUE-300 IOPAMIDOL (ISOVUE-300) INJECTION 61% COMPARISON:  06/23/2014 FINDINGS: Lower chest: 11 mm pulmonary nodule in the right lower lobe unchanged compared with 2016. Mild dependent atelectasis. No focal consolidation or pleural effusion. Coronary artery calcification. Hepatobiliary: Postsurgical clips in the gallbladder fossa. Stable mild intra and extrahepatic biliary dilatation, likely due to postcholecystectomy change. Pancreas: Unremarkable. No pancreatic ductal dilatation or surrounding inflammatory changes. Spleen: Normal in size without focal abnormality. Multiple calcified granuloma Adrenals/Urinary Tract: Adrenal glands are within normal limits. No hydronephrosis. Bladder within normal limits Stomach/Bowel: Stomach is nonenlarged. No dilated small bowel. Appendix is non identified. Mild wall thickening of the transverse colon, splenic flexure, and descending colon, and rectosigmoid colon with mild inflammation in  the fat surrounding the descending and sigmoid colon. No pneumatosis. Few diverticula are present. Vascular/Lymphatic: Aortic atherosclerosis. No aneurysm. No significantly enlarged lymph nodes Reproductive: Status post hysterectomy. No adnexal masses. Other: No free air or free fluid. Musculoskeletal: No acute or suspicious bone lesion. Degenerative changes. IMPRESSION: 1. There is wall thickening involving the transverse: , descending colon and rectosigmoid colon, consistent with colitis of infectious, inflammatory, or ischemic etiologies. No pneumatosis. No evidence for a bowel obstruction. 2. 11 mm pulmonary nodule in the right lower lobe, stable since 2016. 3. Stable mild intra and extrahepatic biliary dilatation, suspected to be secondary to post cholecystectomy changes. Electronically Signed   By: Donavan Foil M.D.   On: 05/31/2016 21:05     Assessment/Plan 83 yo female with sudden onset of n/v/d bloody with colitis on ctscan  Principal Problem:   Colitis, acute-  Lactic acid level is pending.  Unclear if infectious or ischemic.  Empiric coverage with abx flagyl and cipro at this time.  Stool culture.  Abdominal exam is benign.  Will need scoping at some point.  Active Problems:   Essential hypertension-  Cont home bblocker, norvasc but holding lasix   Hyperlipidemia- cont statin   Type 2 diabetes mellitus ([place])- holding  Metformin and junuvia, place on ssi   Hematochezia- monitor H/H     DVT prophylaxis:  scds Code Status:  full Family Communication:  none Disposition Plan:  Per day team Consults called:   none Admission status:   admission   Arlenis Blaydes A MD [facility]  If 7PM-7AM, please contact night-coverage [email or web address] Password [email or web address]  05/31/2016, 11:22 PM

## 2016-05-31 NOTE — ED Notes (Signed)
Patient transported to CT 

## 2016-05-31 NOTE — ED Triage Notes (Signed)
Patient complains of lower abdominal and cramping that started mid morning with multiple episodes of vomiting and diarrhea. On arrival reports that she is now passing frank blood in stool and the pain has resolved in abdomen. No hx of GI problems. Patient alert and oriented on arrival.

## 2016-06-01 DIAGNOSIS — E876 Hypokalemia: Secondary | ICD-10-CM

## 2016-06-01 DIAGNOSIS — E118 Type 2 diabetes mellitus with unspecified complications: Secondary | ICD-10-CM

## 2016-06-01 LAB — CBC
HCT: 37.3 % (ref 36.0–46.0)
HCT: 35.9 % — ABNORMAL LOW (ref 36.0–46.0)
Hemoglobin: 12.9 g/dL (ref 12.0–15.0)
Hemoglobin: 12.1 g/dL (ref 12.0–15.0)
MCH: 32.3 pg (ref 26.0–34.0)
MCH: 31.8 pg (ref 26.0–34.0)
MCHC: 34.6 g/dL (ref 30.0–36.0)
MCHC: 33.7 g/dL (ref 30.0–36.0)
MCV: 93.5 fL (ref 78.0–100.0)
MCV: 94.5 fL (ref 78.0–100.0)
PLATELETS: 193 10*3/uL (ref 150–400)
Platelets: 199 10*3/uL (ref 150–400)
RBC: 3.99 MIL/uL (ref 3.87–5.11)
RBC: 3.8 MIL/uL — ABNORMAL LOW (ref 3.87–5.11)
RDW: 12.2 % (ref 11.5–15.5)
RDW: 12.3 % (ref 11.5–15.5)
WBC: 12.3 10*3/uL — AB (ref 4.0–10.5)
WBC: 11 10*3/uL — AB (ref 4.0–10.5)

## 2016-06-01 LAB — GLUCOSE, CAPILLARY
GLUCOSE-CAPILLARY: 175 mg/dL — AB (ref 65–99)
GLUCOSE-CAPILLARY: 191 mg/dL — AB (ref 65–99)
GLUCOSE-CAPILLARY: 130 mg/dL — AB (ref 65–99)
Glucose-Capillary: 154 mg/dL — ABNORMAL HIGH (ref 65–99)
Glucose-Capillary: 168 mg/dL — ABNORMAL HIGH (ref 65–99)

## 2016-06-01 LAB — BASIC METABOLIC PANEL
Anion gap: 10 (ref 5–15)
BUN: 15 mg/dL (ref 6–20)
CO2: 22 mmol/L (ref 22–32)
CREATININE: 0.86 mg/dL (ref 0.44–1.00)
Calcium: 8.4 mg/dL — ABNORMAL LOW (ref 8.9–10.3)
Chloride: 105 mmol/L (ref 101–111)
GFR calc Af Amer: >60 mL/min (ref >60)
GFR, EST NON AFRICAN AMERICAN: 59 mL/min — AB (ref >60)
GLUCOSE: 219 mg/dL — AB (ref 65–99)
Potassium: 3.3 mmol/L — ABNORMAL LOW (ref 3.5–5.1)
SODIUM: 137 mmol/L (ref 135–145)

## 2016-06-01 MED ORDER — METRONIDAZOLE IN NACL 5-0.79 MG/ML-% IV SOLN
500.0000 mg | Freq: Three times a day (TID) | INTRAVENOUS | Status: DC
  Administered 2016-06-01 – 2016-06-05 (×14): 500 mg via INTRAVENOUS
  Filled 2016-06-01 (×14): qty 100

## 2016-06-01 MED ORDER — CLONIDINE HCL 0.1 MG PO TABS
0.1000 mg | ORAL_TABLET | Freq: Two times a day (BID) | ORAL | Status: DC
  Filled 2016-06-01: qty 1

## 2016-06-01 MED ORDER — SODIUM CHLORIDE 0.9% FLUSH
3.0000 mL | Freq: Two times a day (BID) | INTRAVENOUS | Status: DC
  Administered 2016-06-01 – 2016-06-06 (×6): 3 mL via INTRAVENOUS

## 2016-06-01 MED ORDER — SODIUM CHLORIDE 0.9 % IV SOLN: 250.0000 mL | INTRAVENOUS | Status: DC | PRN

## 2016-06-01 MED ORDER — MORPHINE SULFATE (PF) 4 MG/ML IV SOLN
2.0000 mg | INTRAVENOUS | Status: DC | PRN
  Administered 2016-06-01 – 2016-06-02 (×4): 2 mg via INTRAVENOUS
  Filled 2016-06-01 (×5): qty 1

## 2016-06-01 MED ORDER — PRAVASTATIN SODIUM 20 MG PO TABS
20.0000 mg | ORAL_TABLET | Freq: Every day | ORAL | Status: DC
  Administered 2016-06-01 – 2016-06-06 (×6): 20 mg via ORAL
  Filled 2016-06-01 (×6): qty 1

## 2016-06-01 MED ORDER — METOPROLOL SUCCINATE ER 50 MG PO TB24
50.0000 mg | ORAL_TABLET | Freq: Two times a day (BID) | ORAL | Status: DC
  Administered 2016-06-01 – 2016-06-06 (×12): 50 mg via ORAL
  Filled 2016-06-01 (×11): qty 1

## 2016-06-01 MED ORDER — METOPROLOL SUCCINATE ER 50 MG PO TB24
50.0000 mg | ORAL_TABLET | Freq: Two times a day (BID) | ORAL | Status: DC
  Filled 2016-06-01: qty 1

## 2016-06-01 MED ORDER — CIPROFLOXACIN IN D5W 400 MG/200ML IV SOLN
400.0000 mg | Freq: Two times a day (BID) | INTRAVENOUS | Status: DC
  Administered 2016-06-01 – 2016-06-05 (×9): 400 mg via INTRAVENOUS
  Filled 2016-06-01 (×9): qty 200

## 2016-06-01 MED ORDER — POTASSIUM CHLORIDE CRYS ER 20 MEQ PO TBCR
40.0000 meq | EXTENDED_RELEASE_TABLET | Freq: Once | ORAL | Status: AC
  Administered 2016-06-01: 40 meq via ORAL
  Filled 2016-06-01: qty 2

## 2016-06-01 MED ORDER — AMLODIPINE BESYLATE 10 MG PO TABS
10.0000 mg | ORAL_TABLET | Freq: Every day | ORAL | Status: DC
  Administered 2016-06-01 – 2016-06-06 (×6): 10 mg via ORAL
  Filled 2016-06-01 (×6): qty 1

## 2016-06-01 MED ORDER — SODIUM CHLORIDE 0.9 % IV SOLN
INTRAVENOUS | Status: AC
  Administered 2016-06-01: 16:00:00 via INTRAVENOUS

## 2016-06-01 MED ORDER — ONDANSETRON HCL 4 MG/2ML IJ SOLN
4.0000 mg | Freq: Four times a day (QID) | INTRAMUSCULAR | Status: DC | PRN
  Administered 2016-06-01 – 2016-06-04 (×4): 4 mg via INTRAVENOUS
  Filled 2016-06-01 (×4): qty 2

## 2016-06-01 MED ORDER — ONDANSETRON HCL 4 MG PO TABS
4.0000 mg | ORAL_TABLET | Freq: Four times a day (QID) | ORAL | Status: DC | PRN
  Administered 2016-06-05: 4 mg via ORAL
  Filled 2016-06-01: qty 1

## 2016-06-01 MED ORDER — CLONIDINE HCL 0.1 MG PO TABS
0.1000 mg | ORAL_TABLET | Freq: Two times a day (BID) | ORAL | Status: DC
  Administered 2016-06-01 – 2016-06-06 (×12): 0.1 mg via ORAL
  Filled 2016-06-01 (×11): qty 1

## 2016-06-01 MED ORDER — ACETAMINOPHEN 325 MG PO TABS
650.0000 mg | ORAL_TABLET | Freq: Four times a day (QID) | ORAL | Status: DC | PRN
  Administered 2016-06-01 – 2016-06-05 (×8): 650 mg via ORAL
  Filled 2016-06-01 (×8): qty 2

## 2016-06-01 MED ORDER — LISINOPRIL 40 MG PO TABS
40.0000 mg | ORAL_TABLET | Freq: Every day | ORAL | Status: DC
  Administered 2016-06-01 – 2016-06-06 (×6): 40 mg via ORAL
  Filled 2016-06-01 (×6): qty 1

## 2016-06-01 MED ORDER — SODIUM CHLORIDE 0.9% FLUSH: 3.0000 mL | INTRAVENOUS | Status: DC | PRN

## 2016-06-01 MED ORDER — INSULIN ASPART 100 UNIT/ML ~~LOC~~ SOLN
0.0000 [IU] | Freq: Three times a day (TID) | SUBCUTANEOUS | Status: DC
  Administered 2016-06-01: 2 [IU] via SUBCUTANEOUS
  Administered 2016-06-01: 1 [IU] via SUBCUTANEOUS
  Administered 2016-06-01: 2 [IU] via SUBCUTANEOUS
  Administered 2016-06-02: 3 [IU] via SUBCUTANEOUS
  Administered 2016-06-02: 2 [IU] via SUBCUTANEOUS

## 2016-06-01 NOTE — Progress Notes (Signed)
PROGRESS NOTE   Allison Duffy  TIW:[id number]    DOB: November 06, 1926    DOA: 05/31/2016  PCP: Burnard Bunting, MD   I have briefly reviewed patients previous medical records in [facility].  Brief Narrative:  83 year old female, single, IADL, PMH of HTN, DM 2, HLD, gout, remote colonoscopy with [facility], did extensive housecleaning work on day of admission then abruptly developed lower abdominal pain for cramps that approximately 10:30 AM followed by 3-4 episodes of loose stools and then blood in stools, nausea and nonbloody emesis. Denies fever or chills. No sick contacts with similar symptoms. Did not eat anything unusual. CT abdomen confirms colitis involving transverse, descending and rectosigmoid colon without complicating features.   Assessment & Plan:   Principal Problem:   Colitis, acute Active Problems:   Essential hypertension   Hyperlipidemia   Type 2 diabetes mellitus ([place])   Hematochezia   1. Acute colitis: Involving the transverse, descending and rectosigmoid colon. Likely ischemic colitis based on abruptness of onset. DD: Infectious versus inflammatory. Treating supportively with bowel rest/clear liquids, IV fluids, IV Cipro and Flagyl, pain management. Discussed with [facility] who agreed with above and recommend getting GI pathogen panel PCR to rule out infectious etiology. Low index of suspicion for C. difficile. If does not improve or worsens, consider GI consultation for sigmoidoscopy/colonoscopy. 2. Hypokalemia: Replace and follow. 3. Essential hypertension: Mildly uncontrolled. Continue amlodipine, clonidine, lisinopril and metoprolol. Probably difficult to control based on polypharmacy. 4. Type II DM: Continue SSI. 5. HLD 6. 11 mm RLL pulmonary nodule: Seen on CT and reported stable.   DVT prophylaxis: SCDs Code Status: Full Family Communication: None at bedside Disposition: DC home when medically stable   Consultants:  None   Procedures:   None  Antimicrobials:  IV Cipro and Flagyl    Subjective: Feels slightly better. Abdominal pain improved. Reports lower abdominal pain, intermittent with episodes of cramping/severe pain that lasts a few minutes and then resolves. Couple of BMs this morning, witnessed one in the commode which was basically small amount of dark blood and no stools. Nausea without vomiting.   ROS: No chest pain, dyspnea, fever, chills, dizziness or lightheadedness.  Objective:  Vitals:   06/01/16 0506 06/01/16 0856 06/01/16 0900 06/01/16 1426  BP: (!) 130/48 (!) 160/67  (!) 144/86  Pulse: 66  63 61  Resp: 18   18  Temp: 98.8 F (37.1 C)   98.1 F (36.7 C)  TempSrc: Oral   Oral  SpO2: 94%   95%  Weight:      Height:        Examination:  General exam: Pleasant elderly female sitting up comfortably in bed. Respiratory system: Clear to auscultation. Respiratory effort normal. Cardiovascular system: S1 & S2 heard, RRR. No JVD, murmurs, rubs, gallops or clicks. No pedal edema. Not on telemetry. Gastrointestinal system: Abdomen is nondistended, soft. Mild tenderness in the lower quadrants without peritoneal signs. No organomegaly or masses felt. Normal bowel sounds heard. Central nervous system: Alert and oriented. No focal neurological deficits. Extremities: Symmetric 5 x 5 power. Skin: No rashes, lesions or ulcers Psychiatry: Judgement and insight appear normal. Mood & affect appropriate.     Data Reviewed: I have personally reviewed following labs and imaging studies  CBC:  Recent Labs Lab 05/31/16 1816 06/01/16 0326  WBC 10.7* 12.3*  HGB 14.3 12.9  HCT 41.2 37.3  MCV 93.0 93.5  PLT 223 250   Basic Metabolic Panel:  Recent Labs Lab 05/31/16 1816  06/01/16 0326  NA 137 137  K 4.2 3.3*  CL 102 105  CO2 22 22  GLUCOSE 213* 219*  BUN 23* 15  CREATININE 0.98 0.86  CALCIUM 9.6 8.4*   Liver Function Tests:  Recent Labs Lab 05/31/16 1816  AST 28  ALT 20  ALKPHOS 53   BILITOT 0.7  PROT 7.5  ALBUMIN 4.4   CBG:  Recent Labs Lab 06/01/16 0047 06/01/16 0855 06/01/16 1216  GLUCAP 154* 175* 191*    No results found for this or any previous visit (from the past 240 hour(s)).       Radiology Studies: Ct Abdomen Pelvis W Contrast  Result Date: 05/31/2016 CLINICAL DATA:  Lower abdominal pain in cramping with multiple episodes of vomiting and diarrhea frank blood in the stool EXAM: CT ABDOMEN AND PELVIS WITH CONTRAST TECHNIQUE: Multidetector CT imaging of the abdomen and pelvis was performed using the standard protocol following bolus administration of intravenous contrast. CONTRAST:  127mL ISOVUE-300 IOPAMIDOL (ISOVUE-300) INJECTION 61% COMPARISON:  06/23/2014 FINDINGS: Lower chest: 11 mm pulmonary nodule in the right lower lobe unchanged compared with 2016. Mild dependent atelectasis. No focal consolidation or pleural effusion. Coronary artery calcification. Hepatobiliary: Postsurgical clips in the gallbladder fossa. Stable mild intra and extrahepatic biliary dilatation, likely due to postcholecystectomy change. Pancreas: Unremarkable. No pancreatic ductal dilatation or surrounding inflammatory changes. Spleen: Normal in size without focal abnormality. Multiple calcified granuloma Adrenals/Urinary Tract: Adrenal glands are within normal limits. No hydronephrosis. Bladder within normal limits Stomach/Bowel: Stomach is nonenlarged. No dilated small bowel. Appendix is non identified. Mild wall thickening of the transverse colon, splenic flexure, and descending colon, and rectosigmoid colon with mild inflammation in the fat surrounding the descending and sigmoid colon. No pneumatosis. Few diverticula are present. Vascular/Lymphatic: Aortic atherosclerosis. No aneurysm. No significantly enlarged lymph nodes Reproductive: Status post hysterectomy. No adnexal masses. Other: No free air or free fluid. Musculoskeletal: No acute or suspicious bone lesion. Degenerative  changes. IMPRESSION: 1. There is wall thickening involving the transverse: , descending colon and rectosigmoid colon, consistent with colitis of infectious, inflammatory, or ischemic etiologies. No pneumatosis. No evidence for a bowel obstruction. 2. 11 mm pulmonary nodule in the right lower lobe, stable since 2016. 3. Stable mild intra and extrahepatic biliary dilatation, suspected to be secondary to post cholecystectomy changes. Electronically Signed   By: Donavan Foil M.D.   On: 05/31/2016 21:05        Scheduled Meds: . amLODipine  10 mg Oral Daily  . cloNIDine  0.1 mg Oral BID  . insulin aspart  0-9 Units Subcutaneous TID WC  . lisinopril  40 mg Oral Daily  . metoprolol succinate  50 mg Oral BID  . pravastatin  20 mg Oral Daily  . sodium chloride flush  3 mL Intravenous Q12H   Continuous Infusions: . sodium chloride    . ciprofloxacin Stopped (06/01/16 0956)  . metronidazole 500 mg (06/01/16 1424)     LOS: 1 day     Kayslee Furey, MD, FACP, FHM. [facility] Pager [phone]  If 7PM-7AM, please contact night-coverage [email or web address] Password [email or web address] 06/01/2016, 2:36 PM

## 2016-06-02 DIAGNOSIS — E785 Hyperlipidemia, unspecified: Secondary | ICD-10-CM

## 2016-06-02 DIAGNOSIS — K559 Vascular disorder of intestine, unspecified: Principal | ICD-10-CM

## 2016-06-02 LAB — BASIC METABOLIC PANEL
Anion gap: 8 (ref 5–15)
BUN: 9 mg/dL (ref 6–20)
CALCIUM: 8.2 mg/dL — AB (ref 8.9–10.3)
CHLORIDE: 107 mmol/L (ref 101–111)
CO2: 23 mmol/L (ref 22–32)
CREATININE: 0.88 mg/dL (ref 0.44–1.00)
GFR calc Af Amer: >60 mL/min (ref >60)
GFR, EST NON AFRICAN AMERICAN: 57 mL/min — AB (ref >60)
Glucose, Bld: 188 mg/dL — ABNORMAL HIGH (ref 65–99)
Potassium: 4 mmol/L (ref 3.5–5.1)
SODIUM: 138 mmol/L (ref 135–145)

## 2016-06-02 LAB — CBC
HCT: 34 % — ABNORMAL LOW (ref 36.0–46.0)
HEMOGLOBIN: 11.6 g/dL — AB (ref 12.0–15.0)
MCH: 32.3 pg (ref 26.0–34.0)
MCHC: 34.1 g/dL (ref 30.0–36.0)
MCV: 94.7 fL (ref 78.0–100.0)
PLATELETS: 179 10*3/uL (ref 150–400)
RBC: 3.59 MIL/uL — ABNORMAL LOW (ref 3.87–5.11)
RDW: 12.3 % (ref 11.5–15.5)
WBC: 9.7 10*3/uL (ref 4.0–10.5)

## 2016-06-02 LAB — GLUCOSE, CAPILLARY
GLUCOSE-CAPILLARY: 178 mg/dL — AB (ref 65–99)
GLUCOSE-CAPILLARY: 240 mg/dL — AB (ref 65–99)
Glucose-Capillary: 140 mg/dL — ABNORMAL HIGH (ref 65–99)
Glucose-Capillary: 189 mg/dL — ABNORMAL HIGH (ref 65–99)

## 2016-06-02 MED ORDER — INSULIN ASPART 100 UNIT/ML ~~LOC~~ SOLN
0.0000 [IU] | Freq: Every day | SUBCUTANEOUS | Status: DC
  Administered 2016-06-04 – 2016-06-05 (×2): 2 [IU] via SUBCUTANEOUS

## 2016-06-02 MED ORDER — SODIUM CHLORIDE 0.9 % IV SOLN
INTRAVENOUS | Status: AC
  Administered 2016-06-02: 17:00:00 via INTRAVENOUS

## 2016-06-02 MED ORDER — INSULIN ASPART 100 UNIT/ML ~~LOC~~ SOLN
0.0000 [IU] | Freq: Three times a day (TID) | SUBCUTANEOUS | Status: DC
  Administered 2016-06-02: 1 [IU] via SUBCUTANEOUS
  Administered 2016-06-03 – 2016-06-04 (×6): 3 [IU] via SUBCUTANEOUS
  Administered 2016-06-05: 5 [IU] via SUBCUTANEOUS
  Administered 2016-06-05 (×2): 3 [IU] via SUBCUTANEOUS
  Administered 2016-06-06: 5 [IU] via SUBCUTANEOUS
  Administered 2016-06-06: 2 [IU] via SUBCUTANEOUS

## 2016-06-02 MED ORDER — NAPHAZOLINE-GLYCERIN 0.012-0.2 % OP SOLN
1.0000 [drp] | Freq: Four times a day (QID) | OPHTHALMIC | Status: DC | PRN
  Administered 2016-06-02: 2 [drp] via OPHTHALMIC
  Filled 2016-06-02: qty 15

## 2016-06-02 NOTE — Progress Notes (Signed)
PROGRESS NOTE   Allison Duffy  JSE:[id number]    DOB: Feb 03, 1926    DOA: 05/31/2016  PCP: Burnard Bunting, MD   I have briefly reviewed patients previous medical records in [facility].  Brief Narrative:  83 year old female, single, IADL, PMH of HTN, DM 2, HLD, gout, remote colonoscopy with [facility], did extensive housecleaning work on day of admission then abruptly developed lower abdominal pain for cramps that approximately 10:30 AM followed by 3-4 episodes of loose stools and then blood in stools, nausea and nonbloody emesis. Denies fever or chills. No sick contacts with similar symptoms. Did not eat anything unusual. CT abdomen confirms colitis involving transverse, descending and rectosigmoid colon without complicating features.Improving.   Assessment & Plan:   Principal Problem:   Colitis, acute Active Problems:   Essential hypertension   Hyperlipidemia   Type 2 diabetes mellitus ([place])   Hematochezia   1. Acute colitis: Involving the transverse, descending and rectosigmoid colon. Likely ischemic colitis based on abruptness of onset. DD: Infectious versus inflammatory. Treating supportively with bowel rest/clear liquids, IV fluids, IV Cipro and Flagyl, pain management. Discussed with [facility] on 06/01/16, who agreed with above and recommend getting GI pathogen panel PCR to rule out infectious etiology. Low index of suspicion for C. difficile. If does not improve or worsens, consider GI consultation for sigmoidoscopy/colonoscopy otherwise outpatient follow-up. Slowly improving. Continue management. Clinically in sinus rhythm. Will obtain EKG to review. Advanced to full liquids. 2. Anemia: Follow CBCs. Rectal bleeding is mild and has decreased. 3. Hypokalemia: Replaced. 4. Essential hypertension: Mildly uncontrolled. Continue amlodipine, clonidine, lisinopril and metoprolol. Probably difficult to control based on polypharmacy. 5. Type II DM: Continue SSI. Fluctuating and mildly  uncontrolled. 6. HLD 7. 11 mm RLL pulmonary nodule: Seen on CT and reported stable.   DVT prophylaxis: SCDs Code Status: Full Family Communication: Discussed with daughter, updated care and answered questions. Disposition: DC home when medically stable   Consultants:  None   Procedures:  None  Antimicrobials:  IV Cipro and Flagyl    Subjective: Lower abdominal pain better, rated at 5/10 and intermittent. Occasional nausea but tolerating diet. Diarrhea resolved and bleeding from rectum has significantly improved. Noted 2 small areas of blood without stools, approximately less than 5 mL in commode.  ROS: No chest pain, dyspnea, fever, chills, dizziness or lightheadedness.  Objective:  Vitals:   06/01/16 1426 06/01/16 2129 06/02/16 0608 06/02/16 1451  BP: (!) 144/86 (!) 145/63 (!) 135/54 (!) 127/48  Pulse: 61 67 66 62  Resp: 18 20 18    Temp: 98.1 F (36.7 C) 99 F (37.2 C) 97.5 F (36.4 C) 97.7 F (36.5 C)  TempSrc: Oral Oral Oral Oral  SpO2: 95% 96% 95% 95%  Weight:      Height:        Examination:  General exam: Pleasant elderly female lying comfortably supine in bed. Respiratory system: Clear to auscultation. Respiratory effort normal. Cardiovascular system: S1 & S2 heard, RRR. No JVD, murmurs, rubs, gallops or clicks. No pedal edema. Not on telemetry. Gastrointestinal system: Abdomen is nondistended, soft. Mild tenderness in the lower quadrants without peritoneal signs. No organomegaly or masses felt. Normal bowel sounds heard. Central nervous system: Alert and oriented. No focal neurological deficits. Extremities: Symmetric 5 x 5 power. Skin: No rashes, lesions or ulcers Psychiatry: Judgement and insight appear normal. Mood & affect appropriate.     Data Reviewed: I have personally reviewed following labs and imaging studies  CBC:  Recent Labs  Lab 05/31/16 1816 06/01/16 0326 06/01/16 1648 06/02/16 0526  WBC 10.7* 12.3* 11.0* 9.7  HGB 14.3 12.9  12.1 11.6*  HCT 41.2 37.3 35.9* 34.0*  MCV 93.0 93.5 94.5 94.7  PLT 223 199 193 810   Basic Metabolic Panel:  Recent Labs Lab 05/31/16 1816 06/01/16 0326 06/02/16 0526  NA 137 137 138  K 4.2 3.3* 4.0  CL 102 105 107  CO2 22 22 23   GLUCOSE 213* 219* 188*  BUN 23* 15 9  CREATININE 0.98 0.86 0.88  CALCIUM 9.6 8.4* 8.2*   Liver Function Tests:  Recent Labs Lab 05/31/16 1816  AST 28  ALT 20  ALKPHOS 53  BILITOT 0.7  PROT 7.5  ALBUMIN 4.4   CBG:  Recent Labs Lab 06/01/16 1216 06/01/16 1739 06/01/16 2241 06/02/16 0902 06/02/16 1214  GLUCAP 191* 130* 168* 178* 240*    No results found for this or any previous visit (from the past 240 hour(s)).       Radiology Studies: Ct Abdomen Pelvis W Contrast  Result Date: 05/31/2016 CLINICAL DATA:  Lower abdominal pain in cramping with multiple episodes of vomiting and diarrhea frank blood in the stool EXAM: CT ABDOMEN AND PELVIS WITH CONTRAST TECHNIQUE: Multidetector CT imaging of the abdomen and pelvis was performed using the standard protocol following bolus administration of intravenous contrast. CONTRAST:  110mL ISOVUE-300 IOPAMIDOL (ISOVUE-300) INJECTION 61% COMPARISON:  06/23/2014 FINDINGS: Lower chest: 11 mm pulmonary nodule in the right lower lobe unchanged compared with 2016. Mild dependent atelectasis. No focal consolidation or pleural effusion. Coronary artery calcification. Hepatobiliary: Postsurgical clips in the gallbladder fossa. Stable mild intra and extrahepatic biliary dilatation, likely due to postcholecystectomy change. Pancreas: Unremarkable. No pancreatic ductal dilatation or surrounding inflammatory changes. Spleen: Normal in size without focal abnormality. Multiple calcified granuloma Adrenals/Urinary Tract: Adrenal glands are within normal limits. No hydronephrosis. Bladder within normal limits Stomach/Bowel: Stomach is nonenlarged. No dilated small bowel. Appendix is non identified. Mild wall thickening of  the transverse colon, splenic flexure, and descending colon, and rectosigmoid colon with mild inflammation in the fat surrounding the descending and sigmoid colon. No pneumatosis. Few diverticula are present. Vascular/Lymphatic: Aortic atherosclerosis. No aneurysm. No significantly enlarged lymph nodes Reproductive: Status post hysterectomy. No adnexal masses. Other: No free air or free fluid. Musculoskeletal: No acute or suspicious bone lesion. Degenerative changes. IMPRESSION: 1. There is wall thickening involving the transverse: , descending colon and rectosigmoid colon, consistent with colitis of infectious, inflammatory, or ischemic etiologies. No pneumatosis. No evidence for a bowel obstruction. 2. 11 mm pulmonary nodule in the right lower lobe, stable since 2016. 3. Stable mild intra and extrahepatic biliary dilatation, suspected to be secondary to post cholecystectomy changes. Electronically Signed   By: Donavan Foil M.D.   On: 05/31/2016 21:05        Scheduled Meds: . amLODipine  10 mg Oral Daily  . cloNIDine  0.1 mg Oral BID  . insulin aspart  0-9 Units Subcutaneous TID WC  . lisinopril  40 mg Oral Daily  . metoprolol succinate  50 mg Oral BID  . pravastatin  20 mg Oral Daily  . sodium chloride flush  3 mL Intravenous Q12H   Continuous Infusions: . sodium chloride    . ciprofloxacin Stopped (06/02/16 1110)  . metronidazole 500 mg (06/02/16 1427)     LOS: 2 days     Nadeem Romanoski, MD, FACP, FHM. [facility] Pager [phone]  If 7PM-7AM, please contact night-coverage [email or web address] Password [email or web address] 06/02/2016, 4:07 PM

## 2016-06-03 LAB — CBC
HCT: 34.1 % — ABNORMAL LOW (ref 36.0–46.0)
HEMOGLOBIN: 11.4 g/dL — AB (ref 12.0–15.0)
MCH: 31.8 pg (ref 26.0–34.0)
MCHC: 33.4 g/dL (ref 30.0–36.0)
MCV: 95.3 fL (ref 78.0–100.0)
Platelets: 176 10*3/uL (ref 150–400)
RBC: 3.58 MIL/uL — AB (ref 3.87–5.11)
RDW: 12.2 % (ref 11.5–15.5)
WBC: 8.4 10*3/uL (ref 4.0–10.5)

## 2016-06-03 LAB — GLUCOSE, CAPILLARY
GLUCOSE-CAPILLARY: 207 mg/dL — AB (ref 65–99)
GLUCOSE-CAPILLARY: 209 mg/dL — AB (ref 65–99)
GLUCOSE-CAPILLARY: 183 mg/dL — AB (ref 65–99)
Glucose-Capillary: 219 mg/dL — ABNORMAL HIGH (ref 65–99)

## 2016-06-03 NOTE — Progress Notes (Signed)
PROGRESS NOTE   Allison Duffy  QXI:[id number]    DOB: Oct 03, 1926    DOA: 05/31/2016  PCP: Burnard Bunting, MD   I have briefly reviewed patients previous medical records in [facility].  Brief Narrative:  83 year old female, single, IADL, PMH of HTN, DM 2, HLD, gout, remote colonoscopy with [facility], did extensive housecleaning work on day of admission then abruptly developed lower abdominal pain for cramps that approximately 10:30 AM followed by 3-4 episodes of loose stools and then blood in stools, nausea and nonbloody emesis. Denies fever or chills. No sick contacts with similar symptoms. Did not eat anything unusual. CT abdomen confirms colitis involving transverse, descending and rectosigmoid colon without complicating features.Improving.   Assessment & Plan:   Principal Problem:   Colitis, acute Active Problems:   Essential hypertension   Hyperlipidemia   Type 2 diabetes mellitus ([place])   Hematochezia   1. Acute colitis: Including transverse, descending and rectosigmoid colon  On imaging, differential includes ischemic, inflammatory versus infectious, continue with IV Cipro and Flagyl for presumed infectious etiology, ischemic is on differential, but she reports her abdominal pain is unrelated to oral intake, further blood in stools, no further diarrhea, Dr Algis Liming  Discussed with  GI on 06/01/16, who agreed with above and recommend getting GI pathogen panel PCR to rule out infectious etiology. Low index of suspicion for C. difficile. Patient reports abdominal pain significantly improved, will advance her to soft diet and monitor, she is with temperature 100.4 today, will continue to monitor closely 2.  Anemia: Follow CBCs, no further evidence of GI bleed  3. Hypokalemia: Replaced. 4. Essential hypertension: Acceptable. Continue amlodipine, clonidine, lisinopril and metoprolol.  5. Type II DM: Continue SSI. Fluctuating and mildly uncontrolled. 6. HLD 7. 11 mm RLL  pulmonary nodule: Seen on CT and reported stable.   DVT prophylaxis: SCDs Code Status: Full Family Communication: Discussed with daughter, updated care and answered questions. Disposition: DC home when medically stable   Consultants:  None   Procedures:  None  Antimicrobials:  IV Cipro and Flagyl    Subjective: Reports her lower abdominal pain is better, tolerating full liquid diet with no nausea or vomiting, abdominal pain is not related to oral intake, reports no bowel movements, or  further blood in stool since Sunday evening .   ROS: No chest pain, dyspnea, fever, chills, dizziness or lightheadedness.  Objective:  Vitals:   06/03/16 0048 06/03/16 0631 06/03/16 0632 06/03/16 1317  BP: 122/83 (!) 149/41  (!) 130/50  Pulse: 69 70  61  Resp: 18 18  18   Temp: 99.6 F (37.6 C) (!) 100.4 F (38 C)  98 F (36.7 C)  TempSrc: Oral Oral  Oral  SpO2: 96% (!) 84% 96% 95%  Weight:   84.6 kg (186 lb 8 oz)   Height:        Examination:  General exam: Frail elderly female , sitting on   chair and apparent distress . Respiratory system: Good air entry bilaterally, no wheezing, no use of accessory muscle  Cardiovascular system: S1 & S2 heard, RRR. No JVD, murmurs, rubs, gallops or clicks. No pedal edema. Not on telemetry. Gastrointestinal system: Abdomen soft, nondistended, mild tenderness and lower abdomen, no rebound no guarding, no  peritoneal signs. BS+. Central nervous system: Alert and oriented. No focal neurological deficits. Extremities: Symmetric 5 x 5 power. Skin: No rashes, lesions or ulcers Psychiatry: Judgement and insight appear normal. Mood & affect appropriate.     Data Reviewed:  I have personally reviewed following labs and imaging studies  CBC:  Recent Labs Lab 05/31/16 1816 06/01/16 0326 06/01/16 1648 06/02/16 0526 06/03/16 0535  WBC 10.7* 12.3* 11.0* 9.7 8.4  HGB 14.3 12.9 12.1 11.6* 11.4*  HCT 41.2 37.3 35.9* 34.0* 34.1*  MCV 93.0 93.5 94.5  94.7 95.3  PLT 223 199 193 179 559   Basic Metabolic Panel:  Recent Labs Lab 05/31/16 1816 06/01/16 0326 06/02/16 0526  NA 137 137 138  K 4.2 3.3* 4.0  CL 102 105 107  CO2 22 22 23   GLUCOSE 213* 219* 188*  BUN 23* 15 9  CREATININE 0.98 0.86 0.88  CALCIUM 9.6 8.4* 8.2*   Liver Function Tests:  Recent Labs Lab 05/31/16 1816  AST 28  ALT 20  ALKPHOS 53  BILITOT 0.7  PROT 7.5  ALBUMIN 4.4   CBG:  Recent Labs Lab 06/02/16 1214 06/02/16 1726 06/02/16 2229 06/03/16 0815 06/03/16 1129  GLUCAP 240* 140* 189* 207* 219*    No results found for this or any previous visit (from the past 240 hour(s)).       Radiology Studies: No results found.      Scheduled Meds: . amLODipine  10 mg Oral Daily  . cloNIDine  0.1 mg Oral BID  . insulin aspart  0-5 Units Subcutaneous QHS  . insulin aspart  0-9 Units Subcutaneous TID WC  . lisinopril  40 mg Oral Daily  . metoprolol succinate  50 mg Oral BID  . pravastatin  20 mg Oral Daily  . sodium chloride flush  3 mL Intravenous Q12H   Continuous Infusions: . sodium chloride    . sodium chloride 75 mL/hr at 06/02/16 1651  . ciprofloxacin Stopped (06/03/16 1017)  . metronidazole Stopped (06/03/16 0612)     LOS: 3 days     Phillips Climes, MD [facility] Pager [phone]  If 7PM-7AM, please contact night-coverage [email or web address] Password [email or web address] 06/03/2016, 2:28 PM

## 2016-06-03 NOTE — Consult Note (Signed)
           [facility] CM Primary Care Navigator  06/03/2016  FAVOUR ALESHIRE [birth year] [phone]    Patient seen at the bedside to identify possibledischarge needs. Patient reports having diarrhea with blood, increased abdominal cramping, nausea and vomiting that hadled to this admission. Patient endorses Dr. Burnard Bunting with [facility] as her primary care provider.   [facility] [place] to obtain medications without difficulty.  Patient reports managing her own medications at home using "pill box" system weekly.   Shereports being able to drive prior to admission, but daughter [name]) who lives close by can provide transportation to herdoctors'appointments after discharge as needed.  Patient is living alone and independent with self care. Daughter will be "in and out" to assist her with care needs at home after discharge. Patient's grandson Edison Nasuti) lives next door to her as well.   Anticipated discharge plan is home per patient.  She voiced understanding to call primary care provider's office, when she returns home,for a post discharge follow-up appointment within a week or sooner if needs arise.Patient letter (with PCP's contact number) was provided as a reminder.   Discussed with patient regarding [facility] CMservices available for health management and patient states she is "able to manage diabetes pretty good so far". Patient mentioned checking blood sugar twice a day at home, taking three oral DM medications and PCP is monitoring her about every 4 months with recent A1c of 7.5.  For questions, please contact:  Dannielle Huh, BSN, RN- [facility] Primary Care Navigator  Telephone: [phone] [place]

## 2016-06-03 NOTE — Progress Notes (Signed)
Inpatient Diabetes Program Recommendations  AACE/ADA: New Consensus Statement on Inpatient Glycemic Control (2015)  Target Ranges:  Prepandial:   less than 140 mg/dL      Peak postprandial:   less than 180 mg/dL (1-2 hours)      Critically ill patients:  140 - 180 mg/dL   Lab Results  Component Value Date   GLUCAP 207 (H) 06/03/2016   HGBA1C 7.5 (H) 07/29/2013    Review of Glycemic Control:  Results for REMMI, ARMENTEROS (MRN [id number]) as of 06/03/2016 10:20  Ref. Range 06/02/2016 09:02 06/02/2016 12:14 06/02/2016 17:26 06/02/2016 22:29 06/03/2016 08:15  Glucose-Capillary Latest Ref Range: 65 - 99 mg/dL 178 (H) 240 (H) 140 (H) 189 (H) 207 (H)   Diabetes history: Type 2 diabetes Outpatient Diabetes medications: Glipizide XL 10 mg bid, Metformin 500 mg with breakfast, Januvia 100 mg daily Current orders for Inpatient glycemic control:  Novolog sensitive tid with meals  Inpatient Diabetes Program Recommendations:   May consider adding Lantus 8 units daily while patient is in the hospital.   Thanks, Adah Perl, RN, BC-ADM Inpatient Diabetes Coordinator Pager [phone] (8a-5p)

## 2016-06-04 LAB — CBC
HCT: 36.4 % (ref 36.0–46.0)
Hemoglobin: 12.4 g/dL (ref 12.0–15.0)
MCH: 32.1 pg (ref 26.0–34.0)
MCHC: 34.1 g/dL (ref 30.0–36.0)
MCV: 94.3 fL (ref 78.0–100.0)
PLATELETS: 195 10*3/uL (ref 150–400)
RBC: 3.86 MIL/uL — AB (ref 3.87–5.11)
RDW: 12.3 % (ref 11.5–15.5)
WBC: 10.5 10*3/uL (ref 4.0–10.5)

## 2016-06-04 LAB — BASIC METABOLIC PANEL
ANION GAP: 9 (ref 5–15)
BUN: 5 mg/dL — ABNORMAL LOW (ref 6–20)
CALCIUM: 8.5 mg/dL — AB (ref 8.9–10.3)
CO2: 22 mmol/L (ref 22–32)
Chloride: 108 mmol/L (ref 101–111)
Creatinine, Ser: 0.81 mg/dL (ref 0.44–1.00)
GLUCOSE: 228 mg/dL — AB (ref 65–99)
POTASSIUM: 3.4 mmol/L — AB (ref 3.5–5.1)
SODIUM: 139 mmol/L (ref 135–145)

## 2016-06-04 LAB — C DIFFICILE QUICK SCREEN W PCR REFLEX
C DIFFICLE (CDIFF) ANTIGEN: POSITIVE — AB
C Diff toxin: NEGATIVE

## 2016-06-04 LAB — GLUCOSE, CAPILLARY
GLUCOSE-CAPILLARY: 219 mg/dL — AB (ref 65–99)
Glucose-Capillary: 216 mg/dL — ABNORMAL HIGH (ref 65–99)
Glucose-Capillary: 242 mg/dL — ABNORMAL HIGH (ref 65–99)
Glucose-Capillary: 234 mg/dL — ABNORMAL HIGH (ref 65–99)

## 2016-06-04 LAB — GASTROINTESTINAL PANEL BY PCR, STOOL (REPLACES STOOL CULTURE)

## 2016-06-04 LAB — CLOSTRIDIUM DIFFICILE BY PCR: Toxigenic C. Difficile by PCR: NEGATIVE

## 2016-06-04 MED ORDER — INSULIN GLARGINE 100 UNIT/ML ~~LOC~~ SOLN
8.0000 [IU] | Freq: Every morning | SUBCUTANEOUS | Status: DC
  Administered 2016-06-04 – 2016-06-05 (×2): 8 [IU] via SUBCUTANEOUS
  Filled 2016-06-04 (×2): qty 0.08

## 2016-06-04 MED ORDER — INSULIN ASPART 100 UNIT/ML ~~LOC~~ SOLN
2.0000 [IU] | Freq: Three times a day (TID) | SUBCUTANEOUS | Status: DC
  Administered 2016-06-04: 2 [IU] via SUBCUTANEOUS

## 2016-06-04 MED ORDER — INSULIN ASPART 100 UNIT/ML ~~LOC~~ SOLN
4.0000 [IU] | Freq: Three times a day (TID) | SUBCUTANEOUS | Status: DC
  Administered 2016-06-05: 4 [IU] via SUBCUTANEOUS

## 2016-06-04 NOTE — Progress Notes (Signed)
PROGRESS NOTE   Allison Duffy  WYO:[id number]    DOB: 1926-11-15    DOA: 05/31/2016  PCP: Burnard Bunting, MD   I have briefly reviewed patients previous medical records in [facility].  Brief Narrative:  83 year old female, single, IADL, PMH of HTN, DM 2, HLD, gout, remote colonoscopy with [facility], did extensive housecleaning work on day of admission then abruptly developed lower abdominal pain for cramps that approximately 10:30 AM followed by 3-4 episodes of loose stools and then blood in stools, nausea and nonbloody emesis. Denies fever or chills. No sick contacts with similar symptoms. Did not eat anything unusual. CT abdomen confirms colitis involving transverse, descending and rectosigmoid colon without complicating features.Improving.  Assessment & Plan:   Principal Problem:   Colitis, acute Active Problems:   Essential hypertension   Hyperlipidemia   Type 2 diabetes mellitus ([place])   Hematochezia  1. Acute colitis: Including transverse, descending and rectosigmoid colon  On imaging, differential includes ischemic, inflammatory versus infectious, continue with IV Cipro and Flagyl for presumed infectious etiology but so far stool studies negative,  ischemic is on differential, but she reports her abdominal pain is unrelated to oral intake, further blood in stools, no further diarrhea, Dr Algis Liming  Discussed with [facility] on 06/01/16, who agreed with above and recommend getting GI pathogen panel PCR to rule out infectious etiology. Low index of suspicion for C. difficile. Patient reports abdominal pain significantly improved, will advance diet and monitor.  2.  Anemia: Follow CBCs, no further evidence of GI bleed  3. Hypokalemia: Replaced. 4. Essential hypertension: Acceptable. Continue amlodipine, clonidine, lisinopril and metoprolol.  5. Type II DM: Continue SSI. Poorly controlled, added lantus 8 units daily.  Follow.  6. HLD: stable  7. 11 mm RLL pulmonary nodule: Seen on  CT and reported stable.  DVT prophylaxis: SCDs Code Status: Full Family Communication: Discussed with daughter, updated care and answered questions. Disposition: DC home when medically stable  Consultants:  None   Procedures:  None  Antimicrobials:  IV Cipro and Flagyl    Subjective: Reports her lower abdominal pain is better but had BM with some blood seen yesterday.    ROS: No chest pain, dyspnea, fever, chills, dizziness or lightheadedness.  Objective:  Vitals:   06/03/16 0632 06/03/16 1317 06/03/16 2121 06/04/16 0630  BP:  (!) 130/50 (!) 156/65 (!) 153/57  Pulse:  61 78 76  Resp:  18 18 18   Temp:  98 F (36.7 C) 99.2 F (37.3 C) 99.6 F (37.6 C)  TempSrc:  Oral Oral Oral  SpO2: 96% 95% 94% 90%  Weight: 84.6 kg (186 lb 8 oz)   87.2 kg (192 lb 3.2 oz)  Height:        Examination:  General exam: Frail elderly female , sitting on   chair and apparent distress . Respiratory system: Good air entry bilaterally, no wheezing, no use of accessory muscle  Cardiovascular system: S1 & S2 heard, RRR. No JVD, murmurs, rubs, gallops or clicks. No pedal edema. Not on telemetry. Gastrointestinal system: Abdomen soft, nondistended, mild tenderness and lower abdomen, no rebound no guarding, no  peritoneal signs. BS+. Central nervous system: Alert and oriented. No focal neurological deficits. Extremities: Symmetric 5 x 5 power. Skin: No rashes, lesions or ulcers Psychiatry: Judgement and insight appear normal. Mood & affect appropriate.   Data Reviewed: I have personally reviewed following labs and imaging studies  CBC:  Recent Labs Lab 06/01/16 0326 06/01/16 1648 06/02/16 0526 06/03/16  0535 06/04/16 0742  WBC 12.3* 11.0* 9.7 8.4 10.5  HGB 12.9 12.1 11.6* 11.4* 12.4  HCT 37.3 35.9* 34.0* 34.1* 36.4  MCV 93.5 94.5 94.7 95.3 94.3  PLT 199 193 179 176 865   Basic Metabolic Panel:  Recent Labs Lab 05/31/16 1816 06/01/16 0326 06/02/16 0526 06/04/16 0742  NA 137  137 138 139  K 4.2 3.3* 4.0 3.4*  CL 102 105 107 108  CO2 22 22 23 22   GLUCOSE 213* 219* 188* 228*  BUN 23* 15 9 5*  CREATININE 0.98 0.86 0.88 0.81  CALCIUM 9.6 8.4* 8.2* 8.5*   Liver Function Tests:  Recent Labs Lab 05/31/16 1816  AST 28  ALT 20  ALKPHOS 53  BILITOT 0.7  PROT 7.5  ALBUMIN 4.4   CBG:  Recent Labs Lab 06/03/16 1129 06/03/16 1635 06/03/16 2116 06/04/16 0807 06/04/16 1155  GLUCAP 219* 209* 183* 216* 242*    Recent Results (from the past 240 hour(s))  C difficile quick scan w PCR reflex     Status: Abnormal   Collection Time: 06/04/16  4:45 AM  Result Value Ref Range Status   C Diff antigen POSITIVE (A) NEGATIVE Final   C Diff toxin NEGATIVE NEGATIVE Final   C Diff interpretation Results are indeterminate. See PCR results.  Final  Clostridium Difficile by PCR     Status: None   Collection Time: 06/04/16  4:45 AM  Result Value Ref Range Status   Toxigenic C Difficile by pcr NEGATIVE NEGATIVE Final    Comment: Patient is colonized with non toxigenic C. difficile. May not need treatment unless significant symptoms are present.    Radiology Studies: No results found.  Scheduled Meds: . amLODipine  10 mg Oral Daily  . cloNIDine  0.1 mg Oral BID  . insulin aspart  0-5 Units Subcutaneous QHS  . insulin aspart  0-9 Units Subcutaneous TID WC  . insulin glargine  8 Units Subcutaneous q morning - 10a  . lisinopril  40 mg Oral Daily  . metoprolol succinate  50 mg Oral BID  . pravastatin  20 mg Oral Daily  . sodium chloride flush  3 mL Intravenous Q12H   Continuous Infusions: . sodium chloride    . ciprofloxacin Stopped (06/04/16 7846)  . metronidazole 500 mg (06/04/16 0512)    LOS: 4 days   Irwin Brakeman, MD [facility] Pager [phone]  If 7PM-7AM, please contact night-coverage [email or web address] Password [email or web address] 06/04/2016, 12:13 PM

## 2016-06-04 NOTE — Evaluation (Signed)
Physical Therapy Evaluation Patient Details Name: Allison Duffy MRN: [id number] DOB: 23-Sep-1926 Today's Date: 06/04/2016   History of Present Illness  Pt is an 83 y/o female admitted secondary to lower abdominal cramping, vomiting and diarrhea. CT of the abdomen was positive for collitis. PMH includes DM, arthritis, gout, HTN, and R knee arthroscopy.    Clinical Impression  Pt admitted secondary to problem above with deficits below. PTA, pt was independent using cane for functional mobility, however, reports she feels unsteady. Upon evaluation, pt with limited activity tolerance, decreased balance and strength (LLE>RLE), and required from min guard to min A for functional mobility tasks. Ambulation distance limited secondary to fatigue. Pt and daughter with concerns about d/c home given current deficits. Pt currently lives alone and daughter only available intermittently. Recommending SNF at this time to increase independence with functional mobility. Will update recommendations as necessary according to pt progress. Will continue to follow acutely.     Follow Up Recommendations SNF;Supervision/Assistance - 24 hour    Equipment Recommendations  None recommended by PT    Recommendations for Other Services       Precautions / Restrictions Precautions Precautions: Fall Restrictions Weight Bearing Restrictions: No      Mobility  Bed Mobility               General bed mobility comments: In chair upon entry.   Transfers Overall transfer level: Needs assistance   Transfers: Sit to/from Stand Sit to Stand: Min assist         General transfer comment: Min A for lift assist and for steadying upon full upright. Verbal cues for hand placement with use of RW.   Ambulation/Gait Ambulation/Gait assistance: Min assist;Min guard Ambulation Distance (Feet): 25 Feet Assistive device: Rolling walker (2 wheeled) Gait Pattern/deviations: Step-through pattern;Decreased stride  length;Trunk flexed;Wide base of support Gait velocity: Decreased Gait velocity interpretation: Below normal speed for age/gender General Gait Details: Slow, unsteady gait. Pt easily fatigued and ambulation distance limited. Min guard initially for safety, however, required min A when fatigued for steadying. Verbal cues for upright posture throughout.   Stairs            Wheelchair Mobility    Modified Rankin (Stroke Patients Only)       Balance Overall balance assessment: Needs assistance Sitting-balance support: No upper extremity supported;Feet supported Sitting balance-Leahy Scale: Fair     Standing balance support: Bilateral upper extremity supported;During functional activity Standing balance-Leahy Scale: Poor Standing balance comment: Reliant on RW for stability.                              Pertinent Vitals/Pain Pain Assessment: No/denies pain    Home Living Family/patient expects to be discharged to:: Private residence Living Arrangements: Alone Available Help at Discharge: Family;Available PRN/intermittently Type of Home: House Home Access: Stairs to enter Entrance Stairs-Rails: Right Entrance Stairs-Number of Steps: 5 Home Layout: One level Home Equipment: Tub bench;Hand held shower head;Walker - 2 wheels;Cane - single point      Prior Function Level of Independence: Independent with assistive device(s)         Comments: Uses cane because she feels off balance      Hand Dominance   Dominant Hand: Right    Extremity/Trunk Assessment   Upper Extremity Assessment Upper Extremity Assessment: Generalized weakness    Lower Extremity Assessment Lower Extremity Assessment: LLE deficits/detail;RLE deficits/detail RLE Deficits / Details: Has neuropathy in feet at  baseline. Grossly 4-/5 throughout RLE.  LLE Deficits / Details: Has neuropathy in feet at baseline. Reports more weakness in L. Grossly 3/5 in LLE.     Cervical / Trunk  Assessment Cervical / Trunk Assessment: Kyphotic  Communication   Communication: No difficulties  Cognition Arousal/Alertness: Awake/alert Behavior During Therapy: WFL for tasks assessed/performed Overall Cognitive Status: Within Functional Limits for tasks assessed                                        General Comments General comments (skin integrity, edema, etc.): Pt's daughter present throughout session. Educated about current deficits and both pt and daughter are uncomfortable with d/c home. Educated about SNF at d/c to increase strength and balance and independence with mobility. Pt and daughter agreeable. Educated that we would update recommendations as necessary as well.     Exercises     Assessment/Plan    PT Assessment Patient needs continued PT services  PT Problem List Decreased strength;Decreased activity tolerance;Decreased balance;Decreased mobility;Decreased knowledge of use of DME;Decreased knowledge of precautions;Impaired sensation       PT Treatment Interventions DME instruction;Gait training;Functional mobility training;Therapeutic activities;Therapeutic exercise;Balance training;Neuromuscular re-education;Patient/family education    PT Goals (Current goals can be found in the Care Plan section)  Acute Rehab PT Goals Patient Stated Goal: to get stronger  PT Goal Formulation: With patient Time For Goal Achievement: 06/18/16 Potential to Achieve Goals: Good    Frequency Min 2X/week   Barriers to discharge Decreased caregiver support Lives alone, and daughter only available to assist PRN    Co-evaluation               AM-PAC PT "6 Clicks" Daily Activity  Outcome Measure Difficulty turning over in bed (including adjusting bedclothes, sheets and blankets)?: A Lot Difficulty moving from lying on back to sitting on the side of the bed? : A Lot Difficulty sitting down on and standing up from a chair with arms (e.g., wheelchair, bedside  commode, etc,.)?: Total Help needed moving to and from a bed to chair (including a wheelchair)?: A Little Help needed walking in hospital room?: A Little Help needed climbing 3-5 steps with a railing? : A Lot 6 Click Score: 13    End of Session Equipment Utilized During Treatment: Gait belt Activity Tolerance: Patient limited by fatigue Patient left: in chair;with call bell/phone within reach;with chair alarm set;with family/visitor present Nurse Communication: Mobility status PT Visit Diagnosis: Other abnormalities of gait and mobility (R26.89);Muscle weakness (generalized) (M62.81)    Time: 2725-3664 PT Time Calculation (min) (ACUTE ONLY): 30 min   Charges:   PT Evaluation $PT Eval Moderate Complexity: 1 Procedure PT Treatments $Gait Training: 8-22 mins   PT G Codes:        Nicky Pugh, PT, DPT  Acute Rehabilitation Services  Pager: [phone]   Army Melia 06/04/2016, 12:13 PM

## 2016-06-05 ENCOUNTER — Inpatient Hospital Stay (HOSPITAL_COMMUNITY): Payer: Medicare Other

## 2016-06-05 LAB — URINALYSIS, ROUTINE W REFLEX MICROSCOPIC
BILIRUBIN URINE: NEGATIVE
Bacteria, UA: NONE SEEN
Glucose, UA: >=500 mg/dL — AB
HGB URINE DIPSTICK: NEGATIVE
Ketones, ur: NEGATIVE mg/dL
Nitrite: NEGATIVE
Protein, ur: NEGATIVE mg/dL
SQUAMOUS EPITHELIAL / LPF: NONE SEEN
Specific Gravity, Urine: 1.01 (ref 1.005–1.030)
pH: 6 (ref 5.0–8.0)

## 2016-06-05 LAB — COMPREHENSIVE METABOLIC PANEL
ALT: 16 U/L (ref 14–54)
AST: 19 U/L (ref 15–41)
Albumin: 3 g/dL — ABNORMAL LOW (ref 3.5–5.0)
Alkaline Phosphatase: 32 U/L — ABNORMAL LOW (ref 38–126)
Anion gap: 7 (ref 5–15)
BILIRUBIN TOTAL: 0.8 mg/dL (ref 0.3–1.2)
BUN: <5 mg/dL — ABNORMAL LOW (ref 6–20)
CHLORIDE: 107 mmol/L (ref 101–111)
CO2: 23 mmol/L (ref 22–32)
CREATININE: 0.83 mg/dL (ref 0.44–1.00)
Calcium: 8.2 mg/dL — ABNORMAL LOW (ref 8.9–10.3)
GFR calc Af Amer: >60 mL/min (ref >60)
GLUCOSE: 209 mg/dL — AB (ref 65–99)
Potassium: 3.3 mmol/L — ABNORMAL LOW (ref 3.5–5.1)
Sodium: 137 mmol/L (ref 135–145)
Total Protein: 5.5 g/dL — ABNORMAL LOW (ref 6.5–8.1)

## 2016-06-05 LAB — MAGNESIUM: MAGNESIUM: 1.7 mg/dL (ref 1.7–2.4)

## 2016-06-05 LAB — CBC
HEMATOCRIT: 34 % — AB (ref 36.0–46.0)
Hemoglobin: 11.7 g/dL — ABNORMAL LOW (ref 12.0–15.0)
MCH: 32.3 pg (ref 26.0–34.0)
MCHC: 34.4 g/dL (ref 30.0–36.0)
MCV: 93.9 fL (ref 78.0–100.0)
PLATELETS: 191 10*3/uL (ref 150–400)
RBC: 3.62 MIL/uL — AB (ref 3.87–5.11)
RDW: 12.3 % (ref 11.5–15.5)
WBC: 9.5 10*3/uL (ref 4.0–10.5)

## 2016-06-05 LAB — GLUCOSE, CAPILLARY
GLUCOSE-CAPILLARY: 241 mg/dL — AB (ref 65–99)
GLUCOSE-CAPILLARY: 263 mg/dL — AB (ref 65–99)
GLUCOSE-CAPILLARY: 228 mg/dL — AB (ref 65–99)
Glucose-Capillary: 208 mg/dL — ABNORMAL HIGH (ref 65–99)

## 2016-06-05 MED ORDER — INSULIN GLARGINE 100 UNIT/ML ~~LOC~~ SOLN: 12.0000 [IU] | Freq: Every morning | SUBCUTANEOUS | Status: DC

## 2016-06-05 MED ORDER — POTASSIUM CHLORIDE CRYS ER 20 MEQ PO TBCR
40.0000 meq | EXTENDED_RELEASE_TABLET | Freq: Once | ORAL | Status: AC
  Administered 2016-06-05: 40 meq via ORAL
  Filled 2016-06-05: qty 2

## 2016-06-05 MED ORDER — MORPHINE SULFATE (PF) 4 MG/ML IV SOLN: 1.0000 mg | INTRAVENOUS | Status: DC | PRN

## 2016-06-05 MED ORDER — INSULIN GLARGINE 100 UNIT/ML ~~LOC~~ SOLN
15.0000 [IU] | Freq: Every morning | SUBCUTANEOUS | Status: DC
  Administered 2016-06-06: 15 [IU] via SUBCUTANEOUS
  Filled 2016-06-05: qty 0.15

## 2016-06-05 MED ORDER — INSULIN ASPART 100 UNIT/ML ~~LOC~~ SOLN
8.0000 [IU] | Freq: Three times a day (TID) | SUBCUTANEOUS | Status: DC
  Administered 2016-06-06 (×2): 8 [IU] via SUBCUTANEOUS

## 2016-06-05 MED ORDER — FUROSEMIDE 10 MG/ML IJ SOLN
40.0000 mg | Freq: Once | INTRAMUSCULAR | Status: AC
  Administered 2016-06-05: 40 mg via INTRAVENOUS
  Filled 2016-06-05: qty 4

## 2016-06-05 MED ORDER — INSULIN ASPART 100 UNIT/ML ~~LOC~~ SOLN
5.0000 [IU] | Freq: Three times a day (TID) | SUBCUTANEOUS | Status: DC
  Administered 2016-06-05 (×2): 5 [IU] via SUBCUTANEOUS

## 2016-06-05 NOTE — Progress Notes (Signed)
PROGRESS NOTE   Allison Duffy  MCN:[id number]    DOB: [birth year]    DOA: 05/31/2016  PCP: Burnard Bunting, MD   I have briefly reviewed patients previous medical records in [facility].  Brief Narrative:  83 year old female, single, IADL, PMH of HTN, DM 2, HLD, gout, remote colonoscopy with [facility], did extensive housecleaning work on day of admission then abruptly developed lower abdominal pain for cramps that approximately 10:30 AM followed by 3-4 episodes of loose stools and then blood in stools, nausea and nonbloody emesis. Denies fever or chills. No sick contacts with similar symptoms. Did not eat anything unusual. CT abdomen confirms colitis involving transverse, descending and rectosigmoid colon without complicating features.Improving.  Assessment & Plan:   Principal Problem:   Colitis, acute Active Problems:   Essential hypertension   Hyperlipidemia   Type 2 diabetes mellitus ([place])   Hematochezia  1. Acute colitis: Including transverse, descending and rectosigmoid colon  On imaging, differential includes ischemic, inflammatory versus infectious, continue with IV Cipro and Flagyl for presumed infectious etiology but so far stool studies negative,  ischemic is on differential, but she reports her abdominal pain is unrelated to oral intake, further blood in stools, no further diarrhea, Dr Algis Liming  Discussed with [facility] on 06/01/16, who agreed with above and recommend getting GI pathogen panel PCR to rule out infectious etiology which was negative.  C. Difficile toxin negative. Patient reports abdominal pain significantly improved, will advance diet and monitor.  2.  Anemia: Follow CBCs, no further evidence of GI bleed  3. Pulmonary edema - IV lasix ordered, repeat CXR in AM.   4. Fever - ordered CXR, UA, CBC, blood cultures.   5. Hypokalemia: Replaced. 6. Essential hypertension: Controlled.  Continue amlodipine, clonidine, lisinopril and metoprolol.  7. Type II DM:  Continue SSI. Poorly controlled, increase lantus and prandial insulin.   Follow.  8. HLD: stable  9. 11 mm RLL pulmonary nodule: Seen on CT and reported stable.  DVT prophylaxis: SCDs Code Status: Full Family Communication: Discussed with daughter, updated care and answered questions. Disposition: SNF  Consultants:  None   Procedures:  None  Antimicrobials:  IV Cipro and Flagyl    Subjective: Had fever last night and some SOB this morning.     ROS: No chest pain, dyspnea, fever, chills, dizziness or lightheadedness.  Objective:  Vitals:   06/04/16 1359 06/04/16 2156 06/05/16 0100 06/05/16 0553  BP: 107/88 (!) 141/60    Pulse: 65 81  70  Resp: 19 (!) 24    Temp: 97.9 F (36.6 C) (!) 101.3 F (38.5 C) 99.8 F (37.7 C)   TempSrc:  Oral Oral   SpO2: 90% 91%  (!) 89%  Weight:      Height:        Examination:  General exam: Frail elderly female , sitting on   chair and apparent distress . Respiratory system: Good air entry bilaterally, no wheezing, +crackles. Cardiovascular system: S1 & S2 heard. No JVD, murmurs, rubs, gallops or clicks. No pedal edema.  Gastrointestinal system: Abdomen soft, nondistended, mild tenderness and lower abdomen, no rebound no guarding, no  peritoneal signs. BS+. Central nervous system: Alert and oriented. No focal neurological deficits. Extremities: Symmetric 5 x 5 power. Skin: No rashes, lesions or ulcers Psychiatry: Judgement and insight appear normal. Mood & affect appropriate.   Data Reviewed: I have personally reviewed following labs and imaging studies  CBC:  Recent Labs Lab 06/01/16 1648 06/02/16 0526 06/03/16 0535  06/04/16 0742 06/05/16 0508  WBC 11.0* 9.7 8.4 10.5 9.5  HGB 12.1 11.6* 11.4* 12.4 11.7*  HCT 35.9* 34.0* 34.1* 36.4 34.0*  MCV 94.5 94.7 95.3 94.3 93.9  PLT 193 179 176 195 443   Basic Metabolic Panel:  Recent Labs Lab 05/31/16 1816 06/01/16 0326 06/02/16 0526 06/04/16 0742 06/05/16 0508  NA 137  137 138 139 137  K 4.2 3.3* 4.0 3.4* 3.3*  CL 102 105 107 108 107  CO2 22 22 23 22 23   GLUCOSE 213* 219* 188* 228* 209*  BUN 23* 15 9 5* <5*  CREATININE 0.98 0.86 0.88 0.81 0.83  CALCIUM 9.6 8.4* 8.2* 8.5* 8.2*  MG  --   --   --   --  1.7   Liver Function Tests:  Recent Labs Lab 05/31/16 1816 06/05/16 0508  AST 28 19  ALT 20 16  ALKPHOS 53 32*  BILITOT 0.7 0.8  PROT 7.5 5.5*  ALBUMIN 4.4 3.0*   CBG:  Recent Labs Lab 06/04/16 1155 06/04/16 1707 06/04/16 2152 06/05/16 0806 06/05/16 1249  GLUCAP 242* 219* 234* 241* 263*    Recent Results (from the past 240 hour(s))  Gastrointestinal Panel by PCR , Stool     Status: None   Collection Time: 06/04/16  4:45 AM  Result Value Ref Range Status   Campylobacter species NOT DETECTED NOT DETECTED Final   Plesimonas shigelloides NOT DETECTED NOT DETECTED Final   Salmonella species NOT DETECTED NOT DETECTED Final   Yersinia enterocolitica NOT DETECTED NOT DETECTED Final   Vibrio species NOT DETECTED NOT DETECTED Final   Vibrio cholerae NOT DETECTED NOT DETECTED Final   Enteroaggregative E coli (EAEC) NOT DETECTED NOT DETECTED Final   Enteropathogenic E coli (EPEC) NOT DETECTED NOT DETECTED Final   Enterotoxigenic E coli (ETEC) NOT DETECTED NOT DETECTED Final   Shiga like toxin producing E coli (STEC) NOT DETECTED NOT DETECTED Final   Shigella/Enteroinvasive E coli (EIEC) NOT DETECTED NOT DETECTED Final   Cryptosporidium NOT DETECTED NOT DETECTED Final   Cyclospora cayetanensis NOT DETECTED NOT DETECTED Final   Entamoeba histolytica NOT DETECTED NOT DETECTED Final   Giardia lamblia NOT DETECTED NOT DETECTED Final   Adenovirus F40/41 NOT DETECTED NOT DETECTED Final   Astrovirus NOT DETECTED NOT DETECTED Final   Norovirus GI/GII NOT DETECTED NOT DETECTED Final   Rotavirus A NOT DETECTED NOT DETECTED Final   Sapovirus (I, II, IV, and V) NOT DETECTED NOT DETECTED Final  C difficile quick scan w PCR reflex     Status: Abnormal    Collection Time: 06/04/16  4:45 AM  Result Value Ref Range Status   C Diff antigen POSITIVE (A) NEGATIVE Final   C Diff toxin NEGATIVE NEGATIVE Final   C Diff interpretation Results are indeterminate. See PCR results.  Final  Clostridium Difficile by PCR     Status: None   Collection Time: 06/04/16  4:45 AM  Result Value Ref Range Status   Toxigenic C Difficile by pcr NEGATIVE NEGATIVE Final    Comment: Patient is colonized with non toxigenic C. difficile. May not need treatment unless significant symptoms are present.    Radiology Studies: Dg Chest 2 View  Result Date: 06/05/2016 CLINICAL DATA:  Fever for 1 day. EXAM: CHEST  2 VIEW COMPARISON:  06/05/2016 FINDINGS: Postsurgical changes in left paratracheal region, stable. Cardiomediastinal silhouette is normal. Mediastinal contours appear intact. There is no evidence of focal airspace consolidation pneumothorax. Diffusely increased interstitial markings. Bilateral pleural effusions. The previously  demonstrated by CT of the abdomen dated 05/31/2016 11 mm nodule in the right lung base is not seen radiographically. Osseous structures are without acute abnormality. Right humeral prosthesis unchanged. Soft tissues are grossly normal. IMPRESSION: Pulmonary edema with bilateral pleural effusions. The previously demonstrated by CT 11 mm right lower lobe pulmonary nodule is not seen radiographically and may be obscured by atelectatic changes in the lung bases. If further follow-up of this lung nodule is desired, CT of the chest should be considered. Electronically Signed   By: Fidela Salisbury M.D.   On: 06/05/2016 13:48   Dg Chest Port 1 View  Result Date: 06/05/2016 CLINICAL DATA:  Fever, former smoker, diabetes, hypertension, hyperlipidemia. EXAM: PORTABLE CHEST 1 VIEW COMPARISON:  Report of a chest x-ray of December 10, 2000. FINDINGS: The lungs are well-expanded. The interstitial markings are increased diffusely. Patchy parenchymal density is  present at the lung bases. There is no significant pleural effusion and no pneumothorax. The heart is top-normal in size. The pulmonary vascularity is not clearly engorged. There is calcification in the wall of the aortic arch. There surgical clips in the left paratracheal region which have been previously described. IMPRESSION: Prominent interstitial markings likely reflects chronic bronchitic-smoking related change. Superimposed increased density at both lung bases suggests atelectasis or pneumonia. There is no overt CHF. A PA and lateral chest x-ray is recommended when the patient can tolerate the procedure. Thoracic aortic atherosclerosis. Electronically Signed   By: David  Martinique M.D.   On: 06/05/2016 10:08    Scheduled Meds: . amLODipine  10 mg Oral Daily  . cloNIDine  0.1 mg Oral BID  . furosemide  40 mg Intravenous Once  . insulin aspart  0-5 Units Subcutaneous QHS  . insulin aspart  0-9 Units Subcutaneous TID WC  . insulin aspart  5 Units Subcutaneous TID WC  . [START ON 06/06/2016] insulin glargine  12 Units Subcutaneous q morning - 10a  . lisinopril  40 mg Oral Daily  . metoprolol succinate  50 mg Oral BID  . potassium chloride  40 mEq Oral Once  . pravastatin  20 mg Oral Daily  . sodium chloride flush  3 mL Intravenous Q12H   Continuous Infusions: . sodium chloride    . ciprofloxacin Stopped (06/05/16 0737)  . metronidazole 500 mg (06/05/16 1312)    LOS: 5 days   Irwin Brakeman, MD [facility] Pager [phone]  If 7PM-7AM, please contact night-coverage [email or web address] Password [email or web address] 06/05/2016, 2:09 PM

## 2016-06-05 NOTE — NC FL2 (Signed)
[place] LEVEL OF CARE SCREENING TOOL     IDENTIFICATION  Patient Name: Allison Duffy Birthdate: February 03, 1926 Sex: female Admission Date (Current Location): 05/31/2016  [facility] and [place] Number:  Herbalist and Address:  [facility], [place] [address] [place], [place] [address]      Provider Number: [id number]  Attending Physician Name and Address:  Murlean Iba, MD  Relative Name and Phone Number:  [name] daughter, [phone]    Current Level of Care: Hospital Recommended Level of Care: [place] Prior Approval Number:    Date Approved/Denied:   PASRR Number: [id number]  Discharge Plan: SNF    Current Diagnoses: Patient Active Problem List   Diagnosis Date Noted  . Colitis, acute 05/31/2016  . Hematochezia 05/31/2016  . Primary osteoarthritis of right shoulder 12/27/2014  . Pulmonary nodule 06/23/2014  . Viral gastroenteritis 06/23/2014  . Viral gastritis 06/23/2014  . Septic olecranon bursitis 07/29/2013  . Essential hypertension 07/05/2013  . Hyperlipidemia 07/05/2013  . Type 2 diabetes mellitus ([place]) 07/05/2013  . Chest pain 07/05/2013    Orientation RESPIRATION BLADDER Height & Weight     Self, Time, Situation, Place  Normal Continent Weight: 87.2 kg (192 lb 3.2 oz) Height:  5\' 6"  (167.6 cm)  BEHAVIORAL SYMPTOMS/MOOD NEUROLOGICAL BOWEL NUTRITION STATUS      Continent Diet (please see DC summary)  AMBULATORY STATUS COMMUNICATION OF NEEDS Skin   Limited Assist Verbally Normal                       Personal Care Assistance Level of Assistance  Bathing, Feeding, Dressing Bathing Assistance: Limited assistance Feeding assistance: Independent Dressing Assistance: Limited assistance     Functional Limitations Info             SPECIAL CARE FACTORS FREQUENCY  PT (By licensed PT)     PT Frequency: 5x/week              Contractures      Additional Factors Info   Psychotropic Code Status Info: Full Allergies Info: Codeine, Hydrocodone, Sulfa Antibiotics Psychotropic Info: Clonidine Insulin Sliding Scale Info: 3x daily with meals and at bedtime Isolation Precautions Info: Enteric precautions     Current Medications (06/05/2016):  This is the current hospital active medication list Current Facility-Administered Medications  Medication Dose Route Frequency Provider Last Rate Last Dose  . 0.9 %  sodium chloride infusion  250 mL Intravenous PRN Phillips Grout, MD      . acetaminophen (TYLENOL) tablet 650 mg  650 mg Oral Q6H PRN Jani Gravel, MD   650 mg at 06/04/16 2212  . amLODipine (NORVASC) tablet 10 mg  10 mg Oral Daily Derrill Kay A, MD   10 mg at 06/05/16 0859  . ciprofloxacin (CIPRO) IVPB 400 mg  400 mg Intravenous Q12H Derrill Kay A, MD 200 mL/hr at 06/05/16 0858 400 mg at 06/05/16 0858  . cloNIDine (CATAPRES) tablet 0.1 mg  0.1 mg Oral BID Derrill Kay A, MD   0.1 mg at 06/05/16 0859  . insulin aspart (novoLOG) injection 0-5 Units  0-5 Units Subcutaneous QHS Modena Jansky, MD   2 Units at 06/04/16 2214  . insulin aspart (novoLOG) injection 0-9 Units  0-9 Units Subcutaneous TID WC Modena Jansky, MD   3 Units at 06/05/16 0900  . insulin aspart (novoLOG) injection 4 Units  4 Units Subcutaneous TID WC Johnson, Clanford L, MD  4 Units at 06/05/16 0900  . insulin glargine (LANTUS) injection 8 Units  8 Units Subcutaneous q morning - 10a Murlean Iba, MD   8 Units at 06/05/16 0858  . lisinopril (PRINIVIL,ZESTRIL) tablet 40 mg  40 mg Oral Daily Derrill Kay A, MD   40 mg at 06/05/16 0859  . metoprolol succinate (TOPROL-XL) 24 hr tablet 50 mg  50 mg Oral BID Phillips Grout, MD   50 mg at 06/05/16 0859  . metroNIDAZOLE (FLAGYL) IVPB 500 mg  500 mg Intravenous TID Phillips Grout, MD   Stopped at 06/05/16 [phone]  . morphine 4 MG/ML injection 2 mg  2 mg Intravenous Q4H PRN Modena Jansky, MD   2 mg at 06/02/16 1649  .  naphazoline-glycerin (CLEAR EYES) ophth solution 1-2 drop  1-2 drop Both Eyes QID PRN Modena Jansky, MD   2 drop at 06/02/16 1110  . ondansetron (ZOFRAN) tablet 4 mg  4 mg Oral Q6H PRN Phillips Grout, MD       Or  . ondansetron [facility]) injection 4 mg  4 mg Intravenous Q6H PRN Phillips Grout, MD   4 mg at 06/04/16 1826  . pravastatin (PRAVACHOL) tablet 20 mg  20 mg Oral Daily Derrill Kay A, MD   20 mg at 06/05/16 0859  . sodium chloride flush (NS) 0.9 % injection 3 mL  3 mL Intravenous Q12H Derrill Kay A, MD   3 mL at 06/05/16 0900  . sodium chloride flush (NS) 0.9 % injection 3 mL  3 mL Intravenous PRN Phillips Grout, MD         Discharge Medications: Please see discharge summary for a list of discharge medications.  Relevant Imaging Results:  Relevant Lab Results:   Additional Information SSN: [id number]  Benard Halsted, LCSWA

## 2016-06-05 NOTE — Progress Notes (Signed)
Physical Therapy Treatment Patient Details Name: Allison Duffy MRN: [id number] DOB: 06/07/1926 Today's Date: 06/05/2016    History of Present Illness Pt is an 83 y/o female admitted secondary to lower abdominal cramping, vomiting and diarrhea. CT of the abdomen was positive for collitis. PMH includes DM, arthritis, gout, HTN, and R knee arthroscopy.      PT Comments    Pt continues to increase ambulation distance, however, continues to be very limited secondary to fatigue. Pt requiring min guard assist and multiple standing rest breaks secondary to decreased steadiness and fatigue. Current recommendations remain appropriate. Will continue to follow and progress mobility according to pt tolerance.    Follow Up Recommendations  SNF;Supervision/Assistance - 24 hour     Equipment Recommendations  None recommended by PT    Recommendations for Other Services       Precautions / Restrictions Precautions Precautions: Fall Restrictions Weight Bearing Restrictions: No    Mobility  Bed Mobility               General bed mobility comments: Sitting on BSC upon arrival   Transfers Overall transfer level: Needs assistance Equipment used: None;Rolling walker (2 wheeled) Transfers: Sit to/from [facility] Sit to Stand: Min guard Stand pivot transfers: Min assist       General transfer comment: Min A for stand pivot without use of AD for steadying. Min guard for safety with sit<>stand transfer.   Ambulation/Gait Ambulation/Gait assistance: Min guard Ambulation Distance (Feet): 40 Feet Assistive device: Rolling walker (2 wheeled) Gait Pattern/deviations: Step-through pattern;Decreased stride length;Trunk flexed;Wide base of support Gait velocity: Decreased Gait velocity interpretation: Below normal speed for age/gender General Gait Details: Slow, overall steady gait this session. Ambulation distance continues to be limited secondary to fatigue and required  multiple standing rest breaks. Verbal cues for upright posture throughout.    Stairs            Wheelchair Mobility    Modified Rankin (Stroke Patients Only)       Balance Overall balance assessment: Needs assistance Sitting-balance support: No upper extremity supported;Feet supported Sitting balance-Leahy Scale: Fair     Standing balance support: Bilateral upper extremity supported;During functional activity Standing balance-Leahy Scale: Poor Standing balance comment: Reliant on RW for stability.                             Cognition Arousal/Alertness: Awake/alert Behavior During Therapy: WFL for tasks assessed/performed Overall Cognitive Status: Within Functional Limits for tasks assessed                                        Exercises General Exercises - Lower Extremity Long Arc Quad: AROM;Both;10 reps;Seated Hip Flexion/Marching: AROM;Both;10 reps;Seated Heel Raises: AROM;Both;10 reps;Seated    General Comments General comments (skin integrity, edema, etc.): Pt's daughter present at end of session.       Pertinent Vitals/Pain Pain Assessment: Faces Faces Pain Scale: No hurt    Home Living                      Prior Function            PT Goals (current goals can now be found in the care plan section) Acute Rehab PT Goals Patient Stated Goal: to get stronger  PT Goal Formulation: With patient Time For Goal Achievement: 06/18/16  Potential to Achieve Goals: Good Progress towards PT goals: Progressing toward goals    Frequency    Min 2X/week      PT Plan Current plan remains appropriate    Co-evaluation              AM-PAC PT "6 Clicks" Daily Activity  Outcome Measure  Difficulty turning over in bed (including adjusting bedclothes, sheets and blankets)?: A Lot Difficulty moving from lying on back to sitting on the side of the bed? : A Lot Difficulty sitting down on and standing up from a chair  with arms (e.g., wheelchair, bedside commode, etc,.)?: Total Help needed moving to and from a bed to chair (including a wheelchair)?: A Little Help needed walking in hospital room?: A Little Help needed climbing 3-5 steps with a railing? : A Lot 6 Click Score: 13    End of Session Equipment Utilized During Treatment: Gait belt Activity Tolerance: Patient limited by fatigue Patient left: in chair;with call bell/phone within reach;with chair alarm set;with family/visitor present Nurse Communication: Mobility status PT Visit Diagnosis: Other abnormalities of gait and mobility (R26.89);Muscle weakness (generalized) (M62.81)     Time: 8159-4707 PT Time Calculation (min) (ACUTE ONLY): 18 min  Charges:  $Gait Training: 8-22 mins                    G Codes:       Nicky Pugh, PT, DPT  Acute Rehabilitation Services  Pager: [phone]    Army Melia 06/05/2016, 2:14 PM

## 2016-06-05 NOTE — Clinical Social Work Note (Signed)
Clinical Social Work Assessment  Patient Details  Name: Allison Duffy MRN: [id number] Date of Birth: 16-May-1926  Date of referral:  06/05/16               Reason for consult:  Facility Placement                Permission sought to share information with:  Facility Sport and exercise psychologist, Family Supports Permission granted to share information::  Yes, Verbal Permission Granted  Name::     Heritage manager::  SNFs  Relationship::  Daughter  Contact Information:  [phone]  Housing/Transportation Living arrangements for the past 2 months:  [place] of Information:  Patient, Adult Children Patient Interpreter Needed:  None Criminal Activity/Legal Involvement Pertinent to Current Situation/Hospitalization:  No - Comment as needed Significant Relationships:  Adult Children Lives with:  Self Do you feel safe going back to the place where you live?  No Need for family participation in patient care:  Yes (Comment)  Care giving concerns:  CSW received consult for possible SNF placement at time of discharge. CSW spoke with patient regarding PT recommendation of SNF placement at time of discharge. Patient reported that she lives alone and is currently unable to care for herself at home given patient's current physical needs and fall risk. Patient expressed understanding of PT recommendation and is agreeable to SNF placement at time of discharge. She asked CSW to speak with her daughter in determining a SNF facility. CSW spoke with patient's daughter as well. CSW to continue to follow and assist with discharge planning needs.   Social Worker assessment / plan:  CSW spoke with patient and patient's daughter concerning possibility of rehab at [facility] before returning home.  Employment status:  Retired Forensic scientist:  Medicare PT Recommendations:  [place] / Referral to community resources:  [place]  Patient/Family's Response to  care:  Patient and patient's daughter recognizes need for rehab before returning home and is agreeable to a SNF in [place]. Patient reported preference for [facility] [facility].  Patient/Family's Understanding of and Emotional Response to Diagnosis, Current Treatment, and Prognosis:  Patient/family is realistic regarding therapy needs and expressed being hopeful for SNF placement. Patient expressed understanding of CSW role and discharge process as well as her medical condition. No questions/concerns about plan or treatment.    Emotional Assessment Appearance:  Appears stated age Attitude/Demeanor/Rapport:  Other (Appropriate) Affect (typically observed):  Accepting, Appropriate Orientation:  Oriented to Self, Oriented to Place, Oriented to  Time, Oriented to Situation Alcohol / Substance use:  Not Applicable Psych involvement (Current and /or in the community):  No (Comment)  Discharge Needs  Concerns to be addressed:  Care Coordination Readmission within the last 30 days:  No Current discharge risk:  None Barriers to Discharge:  Continued Medical Work up   [employer], [place] 06/05/2016, 10:44 AM

## 2016-06-06 ENCOUNTER — Inpatient Hospital Stay (HOSPITAL_COMMUNITY): Payer: Medicare Other

## 2016-06-06 DIAGNOSIS — R2681 Unsteadiness on feet: Secondary | ICD-10-CM | POA: Diagnosis not present

## 2016-06-06 DIAGNOSIS — K529 Noninfective gastroenteritis and colitis, unspecified: Secondary | ICD-10-CM | POA: Diagnosis not present

## 2016-06-06 DIAGNOSIS — R05 Cough: Secondary | ICD-10-CM | POA: Diagnosis not present

## 2016-06-06 DIAGNOSIS — L539 Erythematous condition, unspecified: Secondary | ICD-10-CM | POA: Diagnosis not present

## 2016-06-06 DIAGNOSIS — J449 Chronic obstructive pulmonary disease, unspecified: Secondary | ICD-10-CM | POA: Diagnosis not present

## 2016-06-06 DIAGNOSIS — K921 Melena: Secondary | ICD-10-CM | POA: Diagnosis not present

## 2016-06-06 DIAGNOSIS — R609 Edema, unspecified: Secondary | ICD-10-CM | POA: Diagnosis not present

## 2016-06-06 DIAGNOSIS — R278 Other lack of coordination: Secondary | ICD-10-CM | POA: Diagnosis not present

## 2016-06-06 DIAGNOSIS — R41841 Cognitive communication deficit: Secondary | ICD-10-CM | POA: Diagnosis not present

## 2016-06-06 DIAGNOSIS — R54 Age-related physical debility: Secondary | ICD-10-CM | POA: Diagnosis not present

## 2016-06-06 DIAGNOSIS — M6281 Muscle weakness (generalized): Secondary | ICD-10-CM | POA: Diagnosis not present

## 2016-06-06 DIAGNOSIS — L03116 Cellulitis of left lower limb: Secondary | ICD-10-CM | POA: Diagnosis not present

## 2016-06-06 DIAGNOSIS — E785 Hyperlipidemia, unspecified: Secondary | ICD-10-CM | POA: Diagnosis not present

## 2016-06-06 DIAGNOSIS — E118 Type 2 diabetes mellitus with unspecified complications: Secondary | ICD-10-CM | POA: Diagnosis not present

## 2016-06-06 DIAGNOSIS — R0602 Shortness of breath: Secondary | ICD-10-CM | POA: Diagnosis not present

## 2016-06-06 DIAGNOSIS — I10 Essential (primary) hypertension: Secondary | ICD-10-CM | POA: Diagnosis not present

## 2016-06-06 DIAGNOSIS — E119 Type 2 diabetes mellitus without complications: Secondary | ICD-10-CM | POA: Diagnosis not present

## 2016-06-06 DIAGNOSIS — K5229 Other allergic and dietetic gastroenteritis and colitis: Secondary | ICD-10-CM | POA: Diagnosis not present

## 2016-06-06 LAB — COMPREHENSIVE METABOLIC PANEL
ALBUMIN: 3 g/dL — AB (ref 3.5–5.0)
ALT: 14 U/L (ref 14–54)
ANION GAP: 9 (ref 5–15)
AST: 19 U/L (ref 15–41)
Alkaline Phosphatase: 31 U/L — ABNORMAL LOW (ref 38–126)
BUN: 6 mg/dL (ref 6–20)
CALCIUM: 8.4 mg/dL — AB (ref 8.9–10.3)
CHLORIDE: 104 mmol/L (ref 101–111)
CO2: 22 mmol/L (ref 22–32)
Creatinine, Ser: 0.88 mg/dL (ref 0.44–1.00)
GFR calc Af Amer: >60 mL/min (ref >60)
GFR calc non Af Amer: 57 mL/min — ABNORMAL LOW (ref >60)
GLUCOSE: 215 mg/dL — AB (ref 65–99)
Potassium: 3.6 mmol/L (ref 3.5–5.1)
SODIUM: 135 mmol/L (ref 135–145)
Total Bilirubin: 0.6 mg/dL (ref 0.3–1.2)
Total Protein: 5.9 g/dL — ABNORMAL LOW (ref 6.5–8.1)

## 2016-06-06 LAB — CBC WITH DIFFERENTIAL/PLATELET
BASOS PCT: 0 %
Basophils Absolute: 0 10*3/uL (ref 0.0–0.1)
EOS ABS: 0.2 10*3/uL (ref 0.0–0.7)
EOS PCT: 2 %
HCT: 32.1 % — ABNORMAL LOW (ref 36.0–46.0)
HEMOGLOBIN: 11 g/dL — AB (ref 12.0–15.0)
Lymphocytes Relative: 28 %
Lymphs Abs: 2.8 10*3/uL (ref 0.7–4.0)
MCH: 32.4 pg (ref 26.0–34.0)
MCHC: 34.3 g/dL (ref 30.0–36.0)
MCV: 94.7 fL (ref 78.0–100.0)
MONOS PCT: 11 %
Monocytes Absolute: 1.1 10*3/uL — ABNORMAL HIGH (ref 0.1–1.0)
NEUTROS PCT: 59 %
Neutro Abs: 6 10*3/uL (ref 1.7–7.7)
PLATELETS: 208 10*3/uL (ref 150–400)
RBC: 3.39 MIL/uL — AB (ref 3.87–5.11)
RDW: 12.6 % (ref 11.5–15.5)
WBC: 10.2 10*3/uL (ref 4.0–10.5)

## 2016-06-06 LAB — GLUCOSE, CAPILLARY
Glucose-Capillary: 198 mg/dL — ABNORMAL HIGH (ref 65–99)
Glucose-Capillary: 251 mg/dL — ABNORMAL HIGH (ref 65–99)

## 2016-06-06 LAB — HEMOGLOBIN A1C
HEMOGLOBIN A1C: 6.9 % — AB (ref 4.8–5.6)
Mean Plasma Glucose: 151 mg/dL

## 2016-06-06 MED ORDER — METRONIDAZOLE 500 MG PO TABS: 500.0000 mg | ORAL_TABLET | Freq: Two times a day (BID) | ORAL | 0 refills | Status: AC

## 2016-06-06 MED ORDER — INSULIN GLARGINE 100 UNIT/ML ~~LOC~~ SOLN: 15.0000 [IU] | Freq: Every morning | SUBCUTANEOUS | 11 refills | Status: DC

## 2016-06-06 MED ORDER — INSULIN ASPART 100 UNIT/ML ~~LOC~~ SOLN: 6.0000 [IU] | Freq: Three times a day (TID) | SUBCUTANEOUS | 11 refills | Status: DC

## 2016-06-06 MED ORDER — CIPROFLOXACIN HCL 500 MG PO TABS: 500.0000 mg | ORAL_TABLET | Freq: Two times a day (BID) | ORAL | 0 refills | Status: AC

## 2016-06-06 MED ORDER — OMEPRAZOLE 40 MG PO CPDR: 40.0000 mg | DELAYED_RELEASE_CAPSULE | Freq: Every day | ORAL | Status: DC

## 2016-06-06 NOTE — Clinical Social Work Placement (Signed)
   CLINICAL SOCIAL WORK PLACEMENT  NOTE  Date:  06/06/2016  Patient Details  Name: Allison Duffy MRN: [id number] Date of Birth: [birth year]  Clinical Social Work is seeking post-discharge placement for this patient at the [place] level of care (*CSW will initial, date and re-position this form in  chart as items are completed):  Yes   Patient/family provided with [place] Work Department's list of facilities offering this level of care within the geographic area requested by the patient (or if unable, by the patient's family).  Yes   Patient/family informed of their freedom to choose among providers that offer the needed level of care, that participate in Medicare, Medicaid or managed care program needed by the patient, have an available bed and are willing to accept the patient.  Yes   Patient/family informed of [place]'s ownership interest in [facility] and [facility], as well as of the fact that they are under no obligation to receive care at these facilities.  PASRR submitted to EDS on       PASRR number received on       Existing PASRR number confirmed on 06/05/16     FL2 transmitted to all facilities in geographic area requested by pt/family on 06/04/16     FL2 transmitted to all facilities within larger geographic area on       Patient informed that his/her managed care company has contracts with or will negotiate with certain facilities, including the following:        Yes   Patient/family informed of bed offers received.  Patient chooses bed at [place], [place]     Physician recommends and patient chooses bed at      Patient to be transferred to [place], [place] on 06/06/16.  Patient to be transferred to facility by [facility]     Patient family notified on 06/06/16 of transfer.  Name of family member notified:  Daughter     PHYSICIAN       Additional Comment:     _______________________________________________ Benard Halsted, [place] 06/06/2016, 1:30 PM

## 2016-06-06 NOTE — Discharge Summary (Addendum)
Physician Discharge Summary  Allison Duffy IFO:[id number] DOB: 07-Jun-1926 DOA: 05/31/2016  PCP: Burnard Bunting, MD  Admit date: 05/31/2016 Discharge date: 06/06/2016  Admitted From: HOME  Disposition: SNF  Recommendations for Outpatient Follow-up:  1. Follow up with PCP in 1-2 weeks 2. Schedule GI appointment in 2 weeks 3. Please obtain BMP/CBC in one week 4. Please monitor blood sugar 4 times per day 5. Titrate insulin doses as needed for glycemic control.  6. Take cipro and flagyl for 5 days, then STOP.   Discharge Condition: STABLE CODE STATUS: FULL  Diet recommendation: Soft Diet  Brief/Interim Summary: HPI: Allison Duffy is a 83 y.o. female with medical history significant of HTN, HLD, NIDDM comes in with sudden onset of nausea, vomiting, and diarrhea with blood associated with lower abdominal cramps.  She denies any fevers or chills.  No sick contacts.  No colonoscopy since the 1980s.  Vomit bilious without any blood.  Pt found to have colitis, referred for admission for infection and gib.  Brief Narrative:  83 year old female, single, IADL, PMH of HTN, DM 2, HLD, gout, remote colonoscopy with [facility], did extensive housecleaning work on day of admission then abruptly developed lower abdominal pain for cramps that approximately 10:30 AM followed by 3-4 episodes of loose stools and then blood in stools, nausea and nonbloody emesis. Denies fever or chills. No sick contacts with similar symptoms. Did not eat anything unusual. CT abdomen confirms colitis involving transverse, descending and rectosigmoid colon without complicating features.Improving.  Assessment & Plan:   Principal Problem:   Colitis, acute Active Problems:   Essential hypertension   Hyperlipidemia   Type 2 diabetes mellitus ([place])   Hematochezia  1. Acute colitis: Symptoms resolved now.  Including transverse, descending and rectosigmoid colon On imaging, differential includes ischemic, inflammatory  versus infectious, Treated with IV Cipro and Flagyl for presumed infectious etiology but so far stool studies negative,  ischemic is on differential, but she reports her abdominal pain is unrelated to oral intake, no further diarrhea, Dr Algis Liming  Discussed with [facility] on 06/01/16, who agreed with above and recommend getting GI pathogen panel PCR to rule out infectious etiology which was negative.  C. Difficile toxin negative. Patient reports abdominal pain significantly improved, tolerated soft diet.  Complete 5 more days of cipro and flagyl and then stop.  2. Acute blood loss Anemia: no further evidence of GI bleed. Recheck CBC in 1 week.  Hg 11.   3. Pulmonary edema - IV lasix ordered, repeat CXR shows resolution of pulmonary edema and no acute findings.  Resume home oral lasix. 4. Fever - ordered CXR, UA, CBC, blood cultures which have been negative.  No recurrence.   5. Hypokalemia: Replaced. 6. Essential hypertension: Controlled.  Continue amlodipine, clonidine, lisinopril and metoprolol.  7. Type II DM: Continue SSI. Poorly controlled, continue lantus and prandial insulin.   Follow and titrate outpatient.  8. HLD: stable  9. 11 mm RLL pulmonary nodule: Seen on CT and reported stable.  DVT prophylaxis: SCDs Code Status: Full Family Communication: Discussed with daughter, updated care and answered questions. Disposition: SNF [facility]  Consultants:  None   Procedures:  None  Antimicrobials:  IV Cipro and Flagyl - DC'd 06/05/16  Discharge Diagnoses:  Principal Problem:   Colitis, acute Active Problems:   Essential hypertension   Hyperlipidemia   Type 2 diabetes mellitus ([place])   Hematochezia   Discharge Instructions  Discharge Instructions    Increase activity slowly    Complete  by:  As directed      Allergies as of 06/06/2016      Reactions   Codeine Nausea And Vomiting   Hydrocodone    Nausea and vomiting   Sulfa Antibiotics Itching, Nausea Only      Medication  List    STOP taking these medications   glipiZIDE 10 MG 24 hr tablet Commonly known as:  GLUCOTROL XL   traMADol 50 MG tablet Commonly known as:  ULTRAM     TAKE these medications   acetaminophen 500 MG tablet Commonly known as:  TYLENOL Take 1,000 mg by mouth every 6 (six) hours as needed.   aluminum-magnesium hydroxide-simethicone 751-700-17 MG/5ML Susp Commonly known as:  MAALOX Take 30 mLs by mouth 4 (four) times daily -  before meals and at bedtime.   amLODipine 10 MG tablet Commonly known as:  NORVASC Take 10 mg by mouth daily.   ascorbic acid 500 MG tablet Commonly known as:  VITAMIN C Take 500 mg by mouth 3 (three) times daily.   aspirin 81 MG tablet Take 81 mg by mouth 2 (two) times daily.   CALCIUM 600 PO Take 1,200 mg by mouth daily.   cholecalciferol 1000 units tablet Commonly known as:  VITAMIN D Take 1,000 Units by mouth daily.   cloNIDine 0.1 MG tablet Commonly known as:  CATAPRES Take 0.1 mg by mouth 2 (two) times daily.   furosemide 40 MG tablet Commonly known as:  LASIX Take 40 mg by mouth daily.   GLUCOSAMINE PO Take 2,000 mg by mouth daily.   insulin aspart 100 UNIT/ML injection Commonly known as:  novoLOG Inject 6 Units into the skin 3 (three) times daily with meals.   insulin glargine 100 UNIT/ML injection Commonly known as:  LANTUS Inject 0.15 mLs (15 Units total) into the skin every morning. Start taking on:  06/07/2016   lisinopril 40 MG tablet Commonly known as:  PRINIVIL,ZESTRIL Take 40 mg by mouth daily.   magnesium oxide 400 MG tablet Commonly known as:  MAG-OX Take 400 mg by mouth daily.   metFORMIN 500 MG tablet Commonly known as:  GLUCOPHAGE Take 500 mg by mouth daily with breakfast.   metoprolol succinate 50 MG 24 hr tablet Commonly known as:  TOPROL-XL Take 50 mg by mouth 2 (two) times daily.   multivitamin with minerals Tabs tablet Take 1 tablet by mouth 2 (two) times daily. Vitality Multivitamin & mineral    omeprazole 40 MG capsule Commonly known as:  PRILOSEC Take 1 capsule (40 mg total) by mouth daily.   Potassium 99 MG Tabs Take 99 mg by mouth daily.   pravastatin 20 MG tablet Commonly known as:  PRAVACHOL Take 20 mg by mouth daily.   sitaGLIPtin 100 MG tablet Commonly known as:  JANUVIA Take 100 mg by mouth daily.   vitamin E 400 UNIT capsule Take 400 Units by mouth daily.       Contact information for follow-up providers    Burnard Bunting, MD. Schedule an appointment as soon as possible for a visit in 1 week(s).   Specialty:  Internal Medicine Contact information: [place] [address] [phone]            Contact information for after-discharge care    Destination    [facility] Follow up.   Specialty:  [place] information: [place] [phone]  Allergies  Allergen Reactions  . Codeine Nausea And Vomiting  . Hydrocodone     Nausea and vomiting  . Sulfa Antibiotics Itching and Nausea Only   Procedures/Studies: Dg Chest 2 View  Result Date: 06/05/2016 CLINICAL DATA:  Fever for 1 day. EXAM: CHEST  2 VIEW COMPARISON:  06/05/2016 FINDINGS: Postsurgical changes in left paratracheal region, stable. Cardiomediastinal silhouette is normal. Mediastinal contours appear intact. There is no evidence of focal airspace consolidation pneumothorax. Diffusely increased interstitial markings. Bilateral pleural effusions. The previously demonstrated by CT of the abdomen dated 05/31/2016 11 mm nodule in the right lung base is not seen radiographically. Osseous structures are without acute abnormality. Right humeral prosthesis unchanged. Soft tissues are grossly normal. IMPRESSION: Pulmonary edema with bilateral pleural effusions. The previously demonstrated by CT 11 mm right lower lobe pulmonary nodule is not seen radiographically and may be  obscured by atelectatic changes in the lung bases. If further follow-up of this lung nodule is desired, CT of the chest should be considered. Electronically Signed   By: Fidela Salisbury M.D.   On: 06/05/2016 13:48   Ct Abdomen Pelvis W Contrast  Result Date: 05/31/2016 CLINICAL DATA:  Lower abdominal pain in cramping with multiple episodes of vomiting and diarrhea frank blood in the stool EXAM: CT ABDOMEN AND PELVIS WITH CONTRAST TECHNIQUE: Multidetector CT imaging of the abdomen and pelvis was performed using the standard protocol following bolus administration of intravenous contrast. CONTRAST:  130mL ISOVUE-300 IOPAMIDOL (ISOVUE-300) INJECTION 61% COMPARISON:  06/23/2014 FINDINGS: Lower chest: 11 mm pulmonary nodule in the right lower lobe unchanged compared with 2016. Mild dependent atelectasis. No focal consolidation or pleural effusion. Coronary artery calcification. Hepatobiliary: Postsurgical clips in the gallbladder fossa. Stable mild intra and extrahepatic biliary dilatation, likely due to postcholecystectomy change. Pancreas: Unremarkable. No pancreatic ductal dilatation or surrounding inflammatory changes. Spleen: Normal in size without focal abnormality. Multiple calcified granuloma Adrenals/Urinary Tract: Adrenal glands are within normal limits. No hydronephrosis. Bladder within normal limits Stomach/Bowel: Stomach is nonenlarged. No dilated small bowel. Appendix is non identified. Mild wall thickening of the transverse colon, splenic flexure, and descending colon, and rectosigmoid colon with mild inflammation in the fat surrounding the descending and sigmoid colon. No pneumatosis. Few diverticula are present. Vascular/Lymphatic: Aortic atherosclerosis. No aneurysm. No significantly enlarged lymph nodes Reproductive: Status post hysterectomy. No adnexal masses. Other: No free air or free fluid. Musculoskeletal: No acute or suspicious bone lesion. Degenerative changes. IMPRESSION: 1. There is  wall thickening involving the transverse: , descending colon and rectosigmoid colon, consistent with colitis of infectious, inflammatory, or ischemic etiologies. No pneumatosis. No evidence for a bowel obstruction. 2. 11 mm pulmonary nodule in the right lower lobe, stable since 2016. 3. Stable mild intra and extrahepatic biliary dilatation, suspected to be secondary to post cholecystectomy changes. Electronically Signed   By: Donavan Foil M.D.   On: 05/31/2016 21:05   Dg Chest Port 1 View  Result Date: 06/06/2016 CLINICAL DATA:  Shortness of breath and cough EXAM: PORTABLE CHEST 1 VIEW COMPARISON:  Jun 05, 2016 FINDINGS: There is bibasilar atelectasis. No frank edema or consolidation. Heart is mildly enlarged with pulmonary vascular within normal limits. There is aortic atherosclerosis. Patient is status post total shoulder replacement on the right. IMPRESSION: Interval resolution of pulmonary edema. Bibasilar atelectasis. Stable cardiac prominence. No new opacity. Aortic atherosclerosis. Electronically Signed   By: Lowella Grip III M.D.   On: 06/06/2016 08:01   Dg Chest Port 1 View  Result Date: 06/05/2016 CLINICAL DATA:  Fever, former smoker, diabetes, hypertension, hyperlipidemia. EXAM: PORTABLE CHEST 1 VIEW COMPARISON:  Report of a chest x-ray of December 10, 2000. FINDINGS: The lungs are well-expanded. The interstitial markings are increased diffusely. Patchy parenchymal density is present at the lung bases. There is no significant pleural effusion and no pneumothorax. The heart is top-normal in size. The pulmonary vascularity is not clearly engorged. There is calcification in the wall of the aortic arch. There surgical clips in the left paratracheal region which have been previously described. IMPRESSION: Prominent interstitial markings likely reflects chronic bronchitic-smoking related change. Superimposed increased density at both lung bases suggests atelectasis or pneumonia. There is no overt  CHF. A PA and lateral chest x-ray is recommended when the patient can tolerate the procedure. Thoracic aortic atherosclerosis. Electronically Signed   By: David  Martinique M.D.   On: 06/05/2016 10:08   (Echo, Carotid, EGD, Colonoscopy, ERCP)    Subjective: Pt feeling much better today.  No abd pain, No SOB.  No blood stool. No black stool.    Discharge Exam: Vitals:   06/06/16 0521 06/06/16 0908  BP: (!) 132/51 (!) 157/55  Pulse: 68 67  Resp: 20   Temp: 99.3 F (37.4 C)    Vitals:   06/05/16 2110 06/06/16 0521 06/06/16 0538 06/06/16 0908  BP: (!) 149/56 (!) 132/51  (!) 157/55  Pulse: 77 68  67  Resp: 18 20    Temp: 98.8 F (37.1 C) 99.3 F (37.4 C)    TempSrc:  Oral    SpO2: 91% (!) 88% 91%   Weight:      Height:       General: Pt is alert, awake, not in acute distress Cardiovascular: RRR, S1/S2 +, no rubs, no gallops Respiratory: CTA bilaterally, no wheezing, no rhonchi Abdominal: Soft, NT, ND, bowel sounds + Extremities: no edema, no cyanosis  The results of significant diagnostics from this hospitalization (including imaging, microbiology, ancillary and laboratory) are listed below for reference.     Microbiology: Recent Results (from the past 240 hour(s))  Gastrointestinal Panel by PCR , Stool     Status: None   Collection Time: 06/04/16  4:45 AM  Result Value Ref Range Status   Campylobacter species NOT DETECTED NOT DETECTED Final   Plesimonas shigelloides NOT DETECTED NOT DETECTED Final   Salmonella species NOT DETECTED NOT DETECTED Final   Yersinia enterocolitica NOT DETECTED NOT DETECTED Final   Vibrio species NOT DETECTED NOT DETECTED Final   Vibrio cholerae NOT DETECTED NOT DETECTED Final   Enteroaggregative E coli (EAEC) NOT DETECTED NOT DETECTED Final   Enteropathogenic E coli (EPEC) NOT DETECTED NOT DETECTED Final   Enterotoxigenic E coli (ETEC) NOT DETECTED NOT DETECTED Final   Shiga like toxin producing E coli (STEC) NOT DETECTED NOT DETECTED Final    Shigella/Enteroinvasive E coli (EIEC) NOT DETECTED NOT DETECTED Final   Cryptosporidium NOT DETECTED NOT DETECTED Final   Cyclospora cayetanensis NOT DETECTED NOT DETECTED Final   Entamoeba histolytica NOT DETECTED NOT DETECTED Final   Giardia lamblia NOT DETECTED NOT DETECTED Final   Adenovirus F40/41 NOT DETECTED NOT DETECTED Final   Astrovirus NOT DETECTED NOT DETECTED Final   Norovirus GI/GII NOT DETECTED NOT DETECTED Final   Rotavirus A NOT DETECTED NOT DETECTED Final   Sapovirus (I, II, IV, and V) NOT DETECTED NOT DETECTED Final  C difficile quick scan w PCR reflex     Status: Abnormal   Collection Time: 06/04/16  4:45 AM  Result Value Ref Range Status  C Diff antigen POSITIVE (A) NEGATIVE Final   C Diff toxin NEGATIVE NEGATIVE Final   C Diff interpretation Results are indeterminate. See PCR results.  Final  Clostridium Difficile by PCR     Status: None   Collection Time: 06/04/16  4:45 AM  Result Value Ref Range Status   Toxigenic C Difficile by pcr NEGATIVE NEGATIVE Final    Comment: Patient is colonized with non toxigenic C. difficile. May not need treatment unless significant symptoms are present.  Culture, blood (Routine X 2) w Reflex to ID Panel     Status: None (Preliminary result)   Collection Time: 06/05/16 10:18 AM  Result Value Ref Range Status   Specimen Description BLOOD LEFT ARM  Final   Special Requests IN PEDIATRIC BOTTLE Blood Culture adequate volume  Final   Culture NO GROWTH < 24 HOURS  Final   Report Status PENDING  Incomplete  Culture, blood (Routine X 2) w Reflex to ID Panel     Status: None (Preliminary result)   Collection Time: 06/05/16 10:18 AM  Result Value Ref Range Status   Specimen Description BLOOD LEFT HAND  Final   Special Requests IN PEDIATRIC BOTTLE Blood Culture adequate volume  Final   Culture NO GROWTH < 24 HOURS  Final   Report Status PENDING  Incomplete     Labs: BNP (last 3 results) No results for input(s): BNP in the last 8760  hours. Basic Metabolic Panel:  Recent Labs Lab 06/01/16 0326 06/02/16 0526 06/04/16 0742 06/05/16 0508 06/06/16 0502  NA 137 138 139 137 135  K 3.3* 4.0 3.4* 3.3* 3.6  CL 105 107 108 107 104  CO2 22 23 22 23 22   GLUCOSE 219* 188* 228* 209* 215*  BUN 15 9 5* <5* 6  CREATININE 0.86 0.88 0.81 0.83 0.88  CALCIUM 8.4* 8.2* 8.5* 8.2* 8.4*  MG  --   --   --  1.7  --    Liver Function Tests:  Recent Labs Lab 05/31/16 1816 06/05/16 0508 06/06/16 0502  AST 28 19 19   ALT 20 16 14   ALKPHOS 53 32* 31*  BILITOT 0.7 0.8 0.6  PROT 7.5 5.5* 5.9*  ALBUMIN 4.4 3.0* 3.0*   No results for input(s): LIPASE, AMYLASE in the last 168 hours. No results for input(s): AMMONIA in the last 168 hours. CBC:  Recent Labs Lab 06/02/16 0526 06/03/16 0535 06/04/16 0742 06/05/16 0508 06/06/16 0502  WBC 9.7 8.4 10.5 9.5 10.2  NEUTROABS  --   --   --   --  6.0  HGB 11.6* 11.4* 12.4 11.7* 11.0*  HCT 34.0* 34.1* 36.4 34.0* 32.1*  MCV 94.7 95.3 94.3 93.9 94.7  PLT 179 176 195 191 208   Cardiac Enzymes: No results for input(s): CKTOTAL, CKMB, CKMBINDEX, TROPONINI in the last 168 hours. BNP: Invalid input(s): POCBNP CBG:  Recent Labs Lab 06/05/16 0806 06/05/16 1249 06/05/16 1659 06/05/16 2109 06/06/16 0807  GLUCAP 241* 263* 228* 208* 198*   D-Dimer No results for input(s): DDIMER in the last 72 hours. Hgb A1c  Recent Labs  06/05/16 1018  HGBA1C 6.9*   Lipid Profile No results for input(s): CHOL, HDL, LDLCALC, TRIG, CHOLHDL, LDLDIRECT in the last 72 hours. Thyroid function studies No results for input(s): TSH, T4TOTAL, T3FREE, THYROIDAB in the last 72 hours.  Invalid input(s): FREET3 Anemia work up No results for input(s): VITAMINB12, FOLATE, FERRITIN, TIBC, IRON, RETICCTPCT in the last 72 hours. Urinalysis    Component Value Date/Time  COLORURINE YELLOW 06/05/2016 1316   APPEARANCEUR CLEAR 06/05/2016 1316   LABSPEC 1.010 06/05/2016 1316   PHURINE 6.0 06/05/2016 1316    GLUCOSEU >=500 (A) 06/05/2016 1316   HGBUR NEGATIVE 06/05/2016 1316   BILIRUBINUR NEGATIVE 06/05/2016 1316   KETONESUR NEGATIVE 06/05/2016 1316   PROTEINUR NEGATIVE 06/05/2016 1316   UROBILINOGEN 0.2 06/23/2014 1140   NITRITE NEGATIVE 06/05/2016 1316   LEUKOCYTESUR TRACE (A) 06/05/2016 1316   Sepsis Labs Invalid input(s): PROCALCITONIN,  WBC,  LACTICIDVEN Microbiology Recent Results (from the past 240 hour(s))  Gastrointestinal Panel by PCR , Stool     Status: None   Collection Time: 06/04/16  4:45 AM  Result Value Ref Range Status   Campylobacter species NOT DETECTED NOT DETECTED Final   Plesimonas shigelloides NOT DETECTED NOT DETECTED Final   Salmonella species NOT DETECTED NOT DETECTED Final   Yersinia enterocolitica NOT DETECTED NOT DETECTED Final   Vibrio species NOT DETECTED NOT DETECTED Final   Vibrio cholerae NOT DETECTED NOT DETECTED Final   Enteroaggregative E coli (EAEC) NOT DETECTED NOT DETECTED Final   Enteropathogenic E coli (EPEC) NOT DETECTED NOT DETECTED Final   Enterotoxigenic E coli (ETEC) NOT DETECTED NOT DETECTED Final   Shiga like toxin producing E coli (STEC) NOT DETECTED NOT DETECTED Final   Shigella/Enteroinvasive E coli (EIEC) NOT DETECTED NOT DETECTED Final   Cryptosporidium NOT DETECTED NOT DETECTED Final   Cyclospora cayetanensis NOT DETECTED NOT DETECTED Final   Entamoeba histolytica NOT DETECTED NOT DETECTED Final   Giardia lamblia NOT DETECTED NOT DETECTED Final   Adenovirus F40/41 NOT DETECTED NOT DETECTED Final   Astrovirus NOT DETECTED NOT DETECTED Final   Norovirus GI/GII NOT DETECTED NOT DETECTED Final   Rotavirus A NOT DETECTED NOT DETECTED Final   Sapovirus (I, II, IV, and V) NOT DETECTED NOT DETECTED Final  C difficile quick scan w PCR reflex     Status: Abnormal   Collection Time: 06/04/16  4:45 AM  Result Value Ref Range Status   C Diff antigen POSITIVE (A) NEGATIVE Final   C Diff toxin NEGATIVE NEGATIVE Final   C Diff interpretation  Results are indeterminate. See PCR results.  Final  Clostridium Difficile by PCR     Status: None   Collection Time: 06/04/16  4:45 AM  Result Value Ref Range Status   Toxigenic C Difficile by pcr NEGATIVE NEGATIVE Final    Comment: Patient is colonized with non toxigenic C. difficile. May not need treatment unless significant symptoms are present.  Culture, blood (Routine X 2) w Reflex to ID Panel     Status: None (Preliminary result)   Collection Time: 06/05/16 10:18 AM  Result Value Ref Range Status   Specimen Description BLOOD LEFT ARM  Final   Special Requests IN PEDIATRIC BOTTLE Blood Culture adequate volume  Final   Culture NO GROWTH < 24 HOURS  Final   Report Status PENDING  Incomplete  Culture, blood (Routine X 2) w Reflex to ID Panel     Status: None (Preliminary result)   Collection Time: 06/05/16 10:18 AM  Result Value Ref Range Status   Specimen Description BLOOD LEFT HAND  Final   Special Requests IN PEDIATRIC BOTTLE Blood Culture adequate volume  Final   Culture NO GROWTH < 24 HOURS  Final   Report Status PENDING  Incomplete   Time coordinating discharge: 34 minutes  SIGNED:  Irwin Brakeman, MD  [facility] 06/06/2016, 12:23 PM Pager [phone]  If 7PM-7AM, please contact night-coverage  [email or web address] Password [email or web address]

## 2016-06-06 NOTE — Progress Notes (Signed)
Patient will DC to: [facility] Anticipated DC date: 06/06/16 Family notified: Daughter Transport by: Car after 3pm   Per MD patient ready for DC to [facility] [phone]). RN, patient, patient's family, and facility notified of DC. Discharge Summary sent to facility. RN given number for report.  CSW signing off.  Cedric Fishman, [place] Social Worker [phone]

## 2016-06-06 NOTE — Progress Notes (Signed)
Physical Therapy Treatment Patient Details Name: Allison Duffy MRN: [id number] DOB: Jul 02, 1926 Today's Date: 06/06/2016    History of Present Illness Pt is an 83 y/o female admitted secondary to lower abdominal cramping, vomiting and diarrhea. CT of the abdomen was positive for collitis. PMH includes DM, arthritis, gout, HTN, and R knee arthroscopy.      PT Comments    Pt progressing towards goals and increasing ambulation tolerance this session, however, pt continues to be limited secondary to fatigue. Pt with decreased steadiness this session and slight LOB X 1, however, able to self correct. Min guard for safety. Current recommendations appropriate. Will continue to follow to maximize functional mobility independence.    Follow Up Recommendations  SNF;Supervision/Assistance - 24 hour     Equipment Recommendations  None recommended by PT    Recommendations for Other Services       Precautions / Restrictions Precautions Precautions: Fall Restrictions Weight Bearing Restrictions: No    Mobility  Bed Mobility               General bed mobility comments: Sitting in chair upon entry   Transfers Overall transfer level: Needs assistance Equipment used: Rolling walker (2 wheeled) Transfers: Sit to/from Stand Sit to Stand: Min guard         General transfer comment: Min guard for safety. Pt with slight unsteadiness upon standing, but no LOB noted. Pt reporting she feels weak.   Ambulation/Gait Ambulation/Gait assistance: Min guard Ambulation Distance (Feet): 100 Feet Assistive device: Rolling walker (2 wheeled) Gait Pattern/deviations: Step-through pattern;Decreased stride length;Trunk flexed;Wide base of support Gait velocity: Decreased Gait velocity interpretation: Below normal speed for age/gender General Gait Details: Slow, slightly unsteady gait. Pt with slight LOB X 1 with RW, however, able to self correct. Further distance limited by fatigue. REquired  multiple standing rest breaks secondary to fatigue.    Stairs            Wheelchair Mobility    Modified Rankin (Stroke Patients Only)       Balance Overall balance assessment: Needs assistance Sitting-balance support: No upper extremity supported;Feet supported Sitting balance-Leahy Scale: Fair     Standing balance support: Bilateral upper extremity supported;During functional activity Standing balance-Leahy Scale: Poor Standing balance comment: Reliant on RW for stability.                             Cognition Arousal/Alertness: Awake/alert Behavior During Therapy: WFL for tasks assessed/performed Overall Cognitive Status: Within Functional Limits for tasks assessed                                        Exercises      General Comments General comments (skin integrity, edema, etc.): Pt required min guard for transfer to [facility] RN notified about void. Pt very fatigued this session and unable to complete LE exercise.       Pertinent Vitals/Pain Pain Assessment: No/denies pain    Home Living                      Prior Function            PT Goals (current goals can now be found in the care plan section) Acute Rehab PT Goals PT Goal Formulation: With patient Time For Goal Achievement: 06/18/16 Potential to Achieve Goals: Good Progress towards  PT goals: Progressing toward goals    Frequency    Min 2X/week      PT Plan Current plan remains appropriate    Co-evaluation              AM-PAC PT "6 Clicks" Daily Activity  Outcome Measure  Difficulty turning over in bed (including adjusting bedclothes, sheets and blankets)?: A Lot Difficulty moving from lying on back to sitting on the side of the bed? : A Lot Difficulty sitting down on and standing up from a chair with arms (e.g., wheelchair, bedside commode, etc,.)?: Total Help needed moving to and from a bed to chair (including a wheelchair)?: A Little Help  needed walking in hospital room?: A Little Help needed climbing 3-5 steps with a railing? : A Lot 6 Click Score: 13    End of Session Equipment Utilized During Treatment: Gait belt Activity Tolerance: Patient limited by fatigue Patient left: in chair;with call bell/phone within reach;with chair alarm set;with family/visitor present Nurse Communication: Mobility status PT Visit Diagnosis: Other abnormalities of gait and mobility (R26.89);Muscle weakness (generalized) (M62.81)     Time: 5638-9373 PT Time Calculation (min) (ACUTE ONLY): 14 min  Charges:  $Gait Training: 8-22 mins                    G Codes:       Nicky Pugh, PT, DPT  Acute Rehabilitation Services  Pager: [phone]    Army Melia 06/06/2016, 3:05 PM

## 2016-06-06 NOTE — Care Management Important Message (Signed)
Important Message  Patient Details  Name: Allison Duffy MRN: [id number] Date of Birth: 05-18-1926   Medicare Important Message Given:  Yes    Pasqualino Witherspoon [place] 06/06/2016, 1:17 PM

## 2016-06-09 DIAGNOSIS — E119 Type 2 diabetes mellitus without complications: Secondary | ICD-10-CM | POA: Diagnosis not present

## 2016-06-09 DIAGNOSIS — K529 Noninfective gastroenteritis and colitis, unspecified: Secondary | ICD-10-CM | POA: Diagnosis not present

## 2016-06-09 DIAGNOSIS — K921 Melena: Secondary | ICD-10-CM | POA: Diagnosis not present

## 2016-06-09 DIAGNOSIS — I10 Essential (primary) hypertension: Secondary | ICD-10-CM | POA: Diagnosis not present

## 2016-06-09 DIAGNOSIS — E785 Hyperlipidemia, unspecified: Secondary | ICD-10-CM | POA: Diagnosis not present

## 2016-06-10 LAB — CULTURE, BLOOD (ROUTINE X 2)
CULTURE: NO GROWTH
Culture: NO GROWTH
Special Requests: ADEQUATE
Special Requests: ADEQUATE

## 2016-06-17 DIAGNOSIS — L03116 Cellulitis of left lower limb: Secondary | ICD-10-CM | POA: Diagnosis not present

## 2016-06-23 DIAGNOSIS — Z87891 Personal history of nicotine dependence: Secondary | ICD-10-CM | POA: Diagnosis not present

## 2016-06-23 DIAGNOSIS — E119 Type 2 diabetes mellitus without complications: Secondary | ICD-10-CM | POA: Diagnosis not present

## 2016-06-23 DIAGNOSIS — Z9981 Dependence on supplemental oxygen: Secondary | ICD-10-CM | POA: Diagnosis not present

## 2016-06-23 DIAGNOSIS — Z7982 Long term (current) use of aspirin: Secondary | ICD-10-CM | POA: Diagnosis not present

## 2016-06-23 DIAGNOSIS — Z794 Long term (current) use of insulin: Secondary | ICD-10-CM | POA: Diagnosis not present

## 2016-06-23 DIAGNOSIS — M1991 Primary osteoarthritis, unspecified site: Secondary | ICD-10-CM | POA: Diagnosis not present

## 2016-06-23 DIAGNOSIS — I10 Essential (primary) hypertension: Secondary | ICD-10-CM | POA: Diagnosis not present

## 2016-06-23 DIAGNOSIS — Z9181 History of falling: Secondary | ICD-10-CM | POA: Diagnosis not present

## 2016-06-23 DIAGNOSIS — I700 Atherosclerosis of aorta: Secondary | ICD-10-CM | POA: Diagnosis not present

## 2016-06-23 DIAGNOSIS — J449 Chronic obstructive pulmonary disease, unspecified: Secondary | ICD-10-CM | POA: Diagnosis not present

## 2016-06-23 DIAGNOSIS — M109 Gout, unspecified: Secondary | ICD-10-CM | POA: Diagnosis not present

## 2016-06-25 DIAGNOSIS — K529 Noninfective gastroenteritis and colitis, unspecified: Secondary | ICD-10-CM | POA: Diagnosis not present

## 2016-06-25 DIAGNOSIS — D62 Acute posthemorrhagic anemia: Secondary | ICD-10-CM | POA: Diagnosis not present

## 2016-06-25 DIAGNOSIS — E784 Other hyperlipidemia: Secondary | ICD-10-CM | POA: Diagnosis not present

## 2016-06-25 DIAGNOSIS — I10 Essential (primary) hypertension: Secondary | ICD-10-CM | POA: Diagnosis not present

## 2016-06-25 DIAGNOSIS — R911 Solitary pulmonary nodule: Secondary | ICD-10-CM | POA: Diagnosis not present

## 2016-06-25 DIAGNOSIS — E1129 Type 2 diabetes mellitus with other diabetic kidney complication: Secondary | ICD-10-CM | POA: Diagnosis not present

## 2016-06-25 DIAGNOSIS — E876 Hypokalemia: Secondary | ICD-10-CM | POA: Diagnosis not present

## 2016-06-25 DIAGNOSIS — N183 Chronic kidney disease, stage 3 (moderate): Secondary | ICD-10-CM | POA: Diagnosis not present

## 2016-06-27 DIAGNOSIS — J449 Chronic obstructive pulmonary disease, unspecified: Secondary | ICD-10-CM | POA: Diagnosis not present

## 2016-06-27 DIAGNOSIS — I10 Essential (primary) hypertension: Secondary | ICD-10-CM | POA: Diagnosis not present

## 2016-06-30 DIAGNOSIS — J449 Chronic obstructive pulmonary disease, unspecified: Secondary | ICD-10-CM | POA: Diagnosis not present

## 2016-06-30 DIAGNOSIS — I10 Essential (primary) hypertension: Secondary | ICD-10-CM | POA: Diagnosis not present

## 2016-07-04 DIAGNOSIS — J449 Chronic obstructive pulmonary disease, unspecified: Secondary | ICD-10-CM | POA: Diagnosis not present

## 2016-07-04 DIAGNOSIS — I10 Essential (primary) hypertension: Secondary | ICD-10-CM | POA: Diagnosis not present

## 2016-07-07 DIAGNOSIS — J449 Chronic obstructive pulmonary disease, unspecified: Secondary | ICD-10-CM | POA: Diagnosis not present

## 2016-07-07 DIAGNOSIS — I10 Essential (primary) hypertension: Secondary | ICD-10-CM | POA: Diagnosis not present

## 2016-07-09 DIAGNOSIS — I10 Essential (primary) hypertension: Secondary | ICD-10-CM | POA: Diagnosis not present

## 2016-07-09 DIAGNOSIS — J449 Chronic obstructive pulmonary disease, unspecified: Secondary | ICD-10-CM | POA: Diagnosis not present

## 2016-07-28 DIAGNOSIS — E1129 Type 2 diabetes mellitus with other diabetic kidney complication: Secondary | ICD-10-CM | POA: Diagnosis not present

## 2016-07-28 DIAGNOSIS — Z6829 Body mass index (BMI) 29.0-29.9, adult: Secondary | ICD-10-CM | POA: Diagnosis not present

## 2016-07-28 DIAGNOSIS — R1084 Generalized abdominal pain: Secondary | ICD-10-CM | POA: Diagnosis not present

## 2016-08-06 DIAGNOSIS — Z803 Family history of malignant neoplasm of breast: Secondary | ICD-10-CM | POA: Diagnosis not present

## 2016-08-06 DIAGNOSIS — Z1231 Encounter for screening mammogram for malignant neoplasm of breast: Secondary | ICD-10-CM | POA: Diagnosis not present

## 2016-08-11 DIAGNOSIS — E1129 Type 2 diabetes mellitus with other diabetic kidney complication: Secondary | ICD-10-CM | POA: Diagnosis not present

## 2016-08-11 DIAGNOSIS — R8299 Other abnormal findings in urine: Secondary | ICD-10-CM | POA: Diagnosis not present

## 2016-08-11 DIAGNOSIS — N183 Chronic kidney disease, stage 3 (moderate): Secondary | ICD-10-CM | POA: Diagnosis not present

## 2016-08-11 DIAGNOSIS — E784 Other hyperlipidemia: Secondary | ICD-10-CM | POA: Diagnosis not present

## 2016-08-11 DIAGNOSIS — N390 Urinary tract infection, site not specified: Secondary | ICD-10-CM | POA: Diagnosis not present

## 2016-08-18 DIAGNOSIS — Z6829 Body mass index (BMI) 29.0-29.9, adult: Secondary | ICD-10-CM | POA: Diagnosis not present

## 2016-08-18 DIAGNOSIS — E669 Obesity, unspecified: Secondary | ICD-10-CM | POA: Diagnosis not present

## 2016-08-18 DIAGNOSIS — M1990 Unspecified osteoarthritis, unspecified site: Secondary | ICD-10-CM | POA: Diagnosis not present

## 2016-08-18 DIAGNOSIS — E784 Other hyperlipidemia: Secondary | ICD-10-CM | POA: Diagnosis not present

## 2016-08-18 DIAGNOSIS — R911 Solitary pulmonary nodule: Secondary | ICD-10-CM | POA: Diagnosis not present

## 2016-08-18 DIAGNOSIS — I129 Hypertensive chronic kidney disease with stage 1 through stage 4 chronic kidney disease, or unspecified chronic kidney disease: Secondary | ICD-10-CM | POA: Diagnosis not present

## 2016-08-18 DIAGNOSIS — E213 Hyperparathyroidism, unspecified: Secondary | ICD-10-CM | POA: Diagnosis not present

## 2016-08-18 DIAGNOSIS — I10 Essential (primary) hypertension: Secondary | ICD-10-CM | POA: Diagnosis not present

## 2016-08-18 DIAGNOSIS — N183 Chronic kidney disease, stage 3 (moderate): Secondary | ICD-10-CM | POA: Diagnosis not present

## 2016-08-18 DIAGNOSIS — E1129 Type 2 diabetes mellitus with other diabetic kidney complication: Secondary | ICD-10-CM | POA: Diagnosis not present

## 2016-08-18 DIAGNOSIS — Z1389 Encounter for screening for other disorder: Secondary | ICD-10-CM | POA: Diagnosis not present

## 2016-08-19 DIAGNOSIS — Z1212 Encounter for screening for malignant neoplasm of rectum: Secondary | ICD-10-CM | POA: Diagnosis not present

## 2016-10-30 DIAGNOSIS — Z23 Encounter for immunization: Secondary | ICD-10-CM | POA: Diagnosis not present

## 2016-12-15 DIAGNOSIS — I10 Essential (primary) hypertension: Secondary | ICD-10-CM | POA: Diagnosis not present

## 2016-12-15 DIAGNOSIS — E119 Type 2 diabetes mellitus without complications: Secondary | ICD-10-CM | POA: Diagnosis not present

## 2016-12-15 DIAGNOSIS — Z7984 Long term (current) use of oral hypoglycemic drugs: Secondary | ICD-10-CM | POA: Diagnosis not present

## 2016-12-15 DIAGNOSIS — Z79899 Other long term (current) drug therapy: Secondary | ICD-10-CM | POA: Diagnosis not present

## 2016-12-15 DIAGNOSIS — Z471 Aftercare following joint replacement surgery: Secondary | ICD-10-CM | POA: Diagnosis not present

## 2016-12-15 DIAGNOSIS — Z87891 Personal history of nicotine dependence: Secondary | ICD-10-CM | POA: Diagnosis not present

## 2016-12-15 DIAGNOSIS — Z96611 Presence of right artificial shoulder joint: Secondary | ICD-10-CM | POA: Diagnosis not present

## 2016-12-15 DIAGNOSIS — Z9889 Other specified postprocedural states: Secondary | ICD-10-CM | POA: Diagnosis not present

## 2016-12-15 DIAGNOSIS — Z885 Allergy status to narcotic agent status: Secondary | ICD-10-CM | POA: Diagnosis not present

## 2016-12-15 DIAGNOSIS — M19011 Primary osteoarthritis, right shoulder: Secondary | ICD-10-CM | POA: Diagnosis not present

## 2016-12-15 DIAGNOSIS — Z888 Allergy status to other drugs, medicaments and biological substances status: Secondary | ICD-10-CM | POA: Diagnosis not present

## 2016-12-24 DIAGNOSIS — M1990 Unspecified osteoarthritis, unspecified site: Secondary | ICD-10-CM | POA: Diagnosis not present

## 2016-12-24 DIAGNOSIS — N183 Chronic kidney disease, stage 3 (moderate): Secondary | ICD-10-CM | POA: Diagnosis not present

## 2016-12-24 DIAGNOSIS — E1129 Type 2 diabetes mellitus with other diabetic kidney complication: Secondary | ICD-10-CM | POA: Diagnosis not present

## 2016-12-24 DIAGNOSIS — E7849 Other hyperlipidemia: Secondary | ICD-10-CM | POA: Diagnosis not present

## 2016-12-24 DIAGNOSIS — I129 Hypertensive chronic kidney disease with stage 1 through stage 4 chronic kidney disease, or unspecified chronic kidney disease: Secondary | ICD-10-CM | POA: Diagnosis not present

## 2016-12-24 DIAGNOSIS — E213 Hyperparathyroidism, unspecified: Secondary | ICD-10-CM | POA: Diagnosis not present

## 2016-12-24 DIAGNOSIS — E669 Obesity, unspecified: Secondary | ICD-10-CM | POA: Diagnosis not present

## 2016-12-24 DIAGNOSIS — Z6829 Body mass index (BMI) 29.0-29.9, adult: Secondary | ICD-10-CM | POA: Diagnosis not present

## 2016-12-24 DIAGNOSIS — I10 Essential (primary) hypertension: Secondary | ICD-10-CM | POA: Diagnosis not present

## 2016-12-24 DIAGNOSIS — R197 Diarrhea, unspecified: Secondary | ICD-10-CM | POA: Diagnosis not present

## 2016-12-29 ENCOUNTER — Emergency Department (HOSPITAL_COMMUNITY)
Admission: EM | Admit: 2016-12-29 | Discharge: 2016-12-29 | Disposition: A | Discharge status: Home / Self Care | Payer: Medicare Other | Attending: Emergency Medicine | Admitting: Emergency Medicine

## 2016-12-29 ENCOUNTER — Other Ambulatory Visit: Payer: Self-pay

## 2016-12-29 ENCOUNTER — Emergency Department (HOSPITAL_COMMUNITY): Payer: Medicare Other

## 2016-12-29 ENCOUNTER — Encounter (HOSPITAL_COMMUNITY): Payer: Self-pay

## 2016-12-29 DIAGNOSIS — Z794 Long term (current) use of insulin: Secondary | ICD-10-CM | POA: Insufficient documentation

## 2016-12-29 DIAGNOSIS — W19XXXA Unspecified fall, initial encounter: Secondary | ICD-10-CM

## 2016-12-29 DIAGNOSIS — Z87891 Personal history of nicotine dependence: Secondary | ICD-10-CM | POA: Diagnosis not present

## 2016-12-29 DIAGNOSIS — Z79899 Other long term (current) drug therapy: Secondary | ICD-10-CM | POA: Diagnosis not present

## 2016-12-29 DIAGNOSIS — M79601 Pain in right arm: Secondary | ICD-10-CM | POA: Diagnosis not present

## 2016-12-29 DIAGNOSIS — I10 Essential (primary) hypertension: Secondary | ICD-10-CM | POA: Diagnosis not present

## 2016-12-29 DIAGNOSIS — Z7982 Long term (current) use of aspirin: Secondary | ICD-10-CM | POA: Diagnosis not present

## 2016-12-29 DIAGNOSIS — E119 Type 2 diabetes mellitus without complications: Secondary | ICD-10-CM | POA: Diagnosis not present

## 2016-12-29 DIAGNOSIS — M25511 Pain in right shoulder: Secondary | ICD-10-CM | POA: Diagnosis not present

## 2016-12-29 DIAGNOSIS — T148XXA Other injury of unspecified body region, initial encounter: Secondary | ICD-10-CM | POA: Diagnosis not present

## 2016-12-29 HISTORY — DX: Noninfective gastroenteritis and colitis, unspecified: K52.9

## 2016-12-29 LAB — CBG MONITORING, ED: GLUCOSE-CAPILLARY: 147 mg/dL — AB (ref 65–99)

## 2016-12-29 MED ORDER — ACETAMINOPHEN 325 MG PO TABS
650.0000 mg | ORAL_TABLET | Freq: Once | ORAL | Status: AC
  Administered 2016-12-29: 650 mg via ORAL
  Filled 2016-12-29: qty 2

## 2016-12-29 NOTE — ED Triage Notes (Signed)
Per EMS-was walking her dog when the dog accidentally pulled her down-complaining of right shoulder and hip pain/soreness-has had surgery on that shoulder in past, not sure when-no deformities-no LOC, no blood thinners-BP 190/100

## 2016-12-29 NOTE — ED Notes (Signed)
Patient ambulated with cane and standby assist. Arm sling in place. Patient tolerated well.

## 2016-12-29 NOTE — ED Provider Notes (Signed)
[place] DEPT Provider Note   CSN: [id number] Arrival date & time: 12/29/16  1431     History   Chief Complaint Chief Complaint  Patient presents with  . Fall  . Shoulder Pain    HPI Allison Duffy is a 83 y.o. female past medical history of arthritis, gout,  right shoulder replacement presenting with right shoulder pain after trip and fall prior to arrival.  Michela Pitcher that she was tangled in her dog's leash and the dog pulled on relation she fell on her right shoulder she denies any head trauma or loss of consciousness. She denies any numbness or tingling, no weakness, no nausea, vomiting, dizziness. She reports that she is otherwise well. Daughter at bedside. No anticoagulant use  HPI  Past Medical History:  Diagnosis Date  . Arthritis    "scattered joints" (07/29/2013)  . Chest pain   . Colitis   . Gout attack ?1980's  . Hyperlipidemia   . Hypertension   . PONV (postoperative nausea and vomiting)   . Type II diabetes mellitus [facility])     Patient Active Problem List   Diagnosis Date Noted  . Colitis, acute 05/31/2016  . Hematochezia 05/31/2016  . Primary osteoarthritis of right shoulder 12/27/2014  . Pulmonary nodule 06/23/2014  . Viral gastroenteritis 06/23/2014  . Viral gastritis 06/23/2014  . Septic olecranon bursitis 07/29/2013  . Essential hypertension 07/05/2013  . Hyperlipidemia 07/05/2013  . Type 2 diabetes mellitus ([place]) 07/05/2013  . Chest pain 07/05/2013    Past Surgical History:  Procedure Laterality Date  . APPENDECTOMY    . CARPAL TUNNEL RELEASE Right   . CATARACT EXTRACTION W/ INTRAOCULAR LENS  IMPLANT, BILATERAL Bilateral   . CHOLECYSTECTOMY    . KNEE ARTHROSCOPY Right   . OLECRANON BURSECTOMY Left 07/29/2013   "in ER"  . PARATHYROIDECTOMY    . TONSILLECTOMY    . TOTAL SHOULDER REPLACEMENT  2017  . VAGINAL HYSTERECTOMY      OB History    No data available       Home Medications    Prior to Admission  medications   Medication Sig Start Date End Date Taking? Authorizing Provider  acetaminophen (TYLENOL) 500 MG tablet Take 1,000 mg by mouth every 6 (six) hours as needed.    [provider]  aluminum-magnesium hydroxide-simethicone (MAALOX) 200-200-20 MG/5ML SUSP Take 30 mLs by mouth 4 (four) times daily -  before meals and at bedtime.    [provider]  amLODipine (NORVASC) 10 MG tablet Take 10 mg by mouth daily.    [provider]  ascorbic acid (VITAMIN C) 500 MG tablet Take 500 mg by mouth 3 (three) times daily.    [provider]  aspirin 81 MG tablet Take 81 mg by mouth 2 (two) times daily.     [provider]  Calcium Carbonate (CALCIUM 600 PO) Take 1,200 mg by mouth daily.    [provider]  cholecalciferol (VITAMIN D) 1000 UNITS tablet Take 1,000 Units by mouth daily.    [provider]  cloNIDine (CATAPRES) 0.1 MG tablet Take 0.1 mg by mouth 2 (two) times daily.    [provider]  furosemide (LASIX) 40 MG tablet Take 40 mg by mouth daily.    [provider]  Glucosamine HCl (GLUCOSAMINE PO) Take 2,000 mg by mouth daily.    [provider]  insulin aspart (NOVOLOG) 100 UNIT/ML injection Inject 6 Units into the skin 3 (three) times daily with meals.  06/06/16   Johnson, Clanford L, MD  insulin glargine (LANTUS) 100 UNIT/ML injection Inject 0.15 mLs (15 Units total) into the skin every morning. 06/07/16   Johnson, Clanford L, MD  lisinopril (PRINIVIL,ZESTRIL) 40 MG tablet Take 40 mg by mouth daily.    [provider]  magnesium oxide (MAG-OX) 400 MG tablet Take 400 mg by mouth daily.    [provider]  metFORMIN (GLUCOPHAGE) 500 MG tablet Take 500 mg by mouth daily with breakfast.     [provider]  metoprolol succinate (TOPROL-XL) 50 MG 24 hr tablet Take 50 mg by mouth 2 (two) times daily. 05/05/16   [provider]  Multiple Vitamin (MULTIVITAMIN WITH MINERALS)  TABS Take 1 tablet by mouth 2 (two) times daily. Vitality Multivitamin & mineral    [provider]  omeprazole (PRILOSEC) 40 MG capsule Take 1 capsule (40 mg total) by mouth daily. 06/06/16   Johnson, Clanford L, MD  Potassium 99 MG TABS Take 99 mg by mouth daily.    [provider]  pravastatin (PRAVACHOL) 20 MG tablet Take 20 mg by mouth daily.    [provider]  sitaGLIPtin (JANUVIA) 100 MG tablet Take 100 mg by mouth daily.    [provider]  vitamin E 400 UNIT capsule Take 400 Units by mouth daily.    [provider]    Family History Family History  Problem Relation Age of Onset  . Hypertension Father   . Heart disease Father   . Heart disease Mother   . Diabetes Mother   . Hypertension Mother   . Heart disease Brother        both brothers  . Diabetes Brother   . Hypertension Brother     Social History Social History   Tobacco Use  . Smoking status: Former Smoker    Years: 5.00    Types: Cigarettes    Last attempt to quit: 07/05/1953    Years since quitting: 63.5  . Smokeless tobacco: Never Used  Substance Use Topics  . Alcohol use: No  . Drug use: No     Allergies   Codeine; Hydrocodone; and Sulfa antibiotics   Review of Systems Review of Systems  HENT: Negative for facial swelling and trouble swallowing.   Eyes: Negative for visual disturbance.  Respiratory: Negative for cough, shortness of breath, wheezing and stridor.   Cardiovascular: Negative for chest pain and palpitations.  Gastrointestinal: Negative for abdominal pain, nausea and vomiting.  Musculoskeletal: Positive for arthralgias and myalgias. Negative for back pain, gait problem, joint swelling, neck pain and neck stiffness.  Skin: Negative for color change, pallor and rash.  Neurological: Negative for dizziness, tremors, seizures, syncope, facial asymmetry, speech difficulty, weakness, light-headedness, numbness and headaches.     Physical  Exam Updated Vital Signs BP (!) 187/71 (BP Location: Left Arm)   Pulse 71   Temp 98.5 F (36.9 C) (Oral)   Resp 18   Ht 5\' 6"  (1.676 m)   Wt 80.3 kg (177 lb)   SpO2 98%   BMI 28.57 kg/m   Physical Exam  Constitutional: She is oriented to person, place, and time. She appears well-developed and well-nourished. No distress.  Well-appearing, non-toxic, sitting comfortably in bed in no acute distress.  HENT:  Head: Normocephalic and atraumatic.  Mouth/Throat: Oropharynx is clear and moist.  Eyes: Conjunctivae and EOM are normal. Pupils are equal, round, and reactive to light. Right eye exhibits no discharge. Left eye exhibits no discharge.  Neck:  Normal range of motion. Neck supple.  Cardiovascular: Normal rate, regular rhythm, normal heart sounds and intact distal pulses.  No murmur heard. Pulmonary/Chest: Effort normal and breath sounds normal. No stridor. No respiratory distress. She has no wheezes. She has no rales. She exhibits no tenderness.  Abdominal: Soft. She exhibits no distension. There is no tenderness.  Musculoskeletal: Normal range of motion. She exhibits tenderness. She exhibits no edema or deformity.  ttp of right proximal humerus No midline ttp of entire spine. No ttp of right hip. Full rom at the hip. Ambulatory without difficulties  Neurological: She is alert and oriented to person, place, and time. No cranial nerve deficit or sensory deficit. She exhibits normal muscle tone.  5/5 strength to upper and lower extremities bilaterally. NVI.  Neurologic Exam:  - Mental status: Patient is alert and cooperative. Fluent speech and words are clear. Coherent thought processes and insight is good. Patient is oriented x 4 to person, place, time and event.  - Cranial nerves:  CN III, IV, VI: pupils equally round, reactive to light both direct and conscensual. Full extra-ocular movement. CN V: motor temporalis and masseter strength intact. CN VII : muscles of facial expression  intact. CN X :  midline uvula. XI strength of sternocleidomastoid and trapezius muscles 5/5, XII: tongue is midline when protruded. - Motor: No involuntary movements. Muscle tone and bulk normal throughout. Muscle strength is 5/5 in bilateral shoulder abduction, elbow flexion and extension, grip, hip extension, flexion, leg flexion and extension, ankle dorsiflexion and plantar flexion.  - Sensory:  light tough sensation intact in all extremities . Normal stance and gait.  Skin: Skin is warm and dry. Capillary refill takes less than 2 seconds. No rash noted. She is not diaphoretic. No erythema. No pallor.  Psychiatric: She has a normal mood and affect.  Nursing note and vitals reviewed.    ED Treatments / Results  Labs (all labs ordered are listed, but only abnormal results are displayed) Labs Reviewed  CBG MONITORING, ED - Abnormal; Notable for the following components:      Result Value   Glucose-Capillary 147 (*)    All other components within normal limits    EKG  EKG Interpretation None       Radiology Dg Shoulder Right  Result Date: 12/29/2016 CLINICAL DATA:  83 year old female post fall. Right shoulder pain. Initial encounter. EXAM: RIGHT SHOULDER - 2+ VIEW COMPARISON:  06/06/2016 chest x-ray. FINDINGS: Post total right shoulder replacement. No acute fracture or dislocation. Chronic lung changes/ pulmonary vascular congestion. IMPRESSION: Post total right shoulder replacement. No acute fracture or dislocation. Electronically Signed   By: Genia Del M.D.   On: 12/29/2016 16:52    Procedures Procedures (including critical care time)  Medications Ordered in ED Medications  acetaminophen (TYLENOL) tablet 650 mg (650 mg Oral Given 12/29/16 2132)     Initial Impression / Assessment and Plan / ED Course  I have reviewed the triage vital signs and the nursing notes.  Pertinent labs & imaging results that were available during my care of the patient were reviewed by me and  considered in my medical decision making (see chart for details).    Patient presents with trip and fall at home, no head trauma or LOC, no anticoagulant use  Reassuring exam, normal neuro, full rom  Radiology negative for acute abnormality.  She is well-appearing, ambulatory.  Will dc home with symptomatic relief and close PCP follow up. Patient was discussed with Dr. Regenia Skeeter who has  seen patient and agrees with assessment and plan.  Discussed strict return precautions and advised to return to the emergency department if experiencing any new or worsening symptoms. Instructions were understood and patient agreed with discharge plan.  Final Clinical Impressions(s) / ED Diagnoses   Final diagnoses:  Fall, initial encounter  Acute pain of right shoulder    ED Discharge Orders    None       Dossie Der 12/29/16 2147    Sherwood Gambler, MD 12/30/16 [phone]

## 2016-12-29 NOTE — Discharge Instructions (Addendum)
As discussed, wear your sling for comfort at night, but make sure that you keep moving your shoulder using the range of motion exercises provided. Apply ice for the first 48 hours and tylenol for pain.  Heat after 48 hours. Follow up with your primary care provider. Return sooner if symptoms worsen or new concerning symptoms in the meantime.

## 2017-03-31 DIAGNOSIS — H26492 Other secondary cataract, left eye: Secondary | ICD-10-CM | POA: Diagnosis not present

## 2017-03-31 DIAGNOSIS — H04123 Dry eye syndrome of bilateral lacrimal glands: Secondary | ICD-10-CM | POA: Diagnosis not present

## 2017-03-31 DIAGNOSIS — H02411 Mechanical ptosis of right eyelid: Secondary | ICD-10-CM | POA: Diagnosis not present

## 2017-03-31 DIAGNOSIS — H353130 Nonexudative age-related macular degeneration, bilateral, stage unspecified: Secondary | ICD-10-CM | POA: Diagnosis not present

## 2017-05-11 DIAGNOSIS — N183 Chronic kidney disease, stage 3 (moderate): Secondary | ICD-10-CM | POA: Diagnosis not present

## 2017-05-11 DIAGNOSIS — R197 Diarrhea, unspecified: Secondary | ICD-10-CM | POA: Diagnosis not present

## 2017-05-11 DIAGNOSIS — I129 Hypertensive chronic kidney disease with stage 1 through stage 4 chronic kidney disease, or unspecified chronic kidney disease: Secondary | ICD-10-CM | POA: Diagnosis not present

## 2017-05-11 DIAGNOSIS — Z1389 Encounter for screening for other disorder: Secondary | ICD-10-CM | POA: Diagnosis not present

## 2017-05-11 DIAGNOSIS — E213 Hyperparathyroidism, unspecified: Secondary | ICD-10-CM | POA: Diagnosis not present

## 2017-05-11 DIAGNOSIS — I10 Essential (primary) hypertension: Secondary | ICD-10-CM | POA: Diagnosis not present

## 2017-05-11 DIAGNOSIS — M1990 Unspecified osteoarthritis, unspecified site: Secondary | ICD-10-CM | POA: Diagnosis not present

## 2017-05-11 DIAGNOSIS — E1129 Type 2 diabetes mellitus with other diabetic kidney complication: Secondary | ICD-10-CM | POA: Diagnosis not present

## 2017-05-11 DIAGNOSIS — E669 Obesity, unspecified: Secondary | ICD-10-CM | POA: Diagnosis not present

## 2017-05-11 DIAGNOSIS — R911 Solitary pulmonary nodule: Secondary | ICD-10-CM | POA: Diagnosis not present

## 2017-05-11 DIAGNOSIS — E7849 Other hyperlipidemia: Secondary | ICD-10-CM | POA: Diagnosis not present

## 2017-08-12 DIAGNOSIS — Z1231 Encounter for screening mammogram for malignant neoplasm of breast: Secondary | ICD-10-CM | POA: Diagnosis not present

## 2017-08-27 DIAGNOSIS — N183 Chronic kidney disease, stage 3 (moderate): Secondary | ICD-10-CM | POA: Diagnosis not present

## 2017-08-27 DIAGNOSIS — E1129 Type 2 diabetes mellitus with other diabetic kidney complication: Secondary | ICD-10-CM | POA: Diagnosis not present

## 2017-08-27 DIAGNOSIS — R82998 Other abnormal findings in urine: Secondary | ICD-10-CM | POA: Diagnosis not present

## 2017-08-27 DIAGNOSIS — E7849 Other hyperlipidemia: Secondary | ICD-10-CM | POA: Diagnosis not present

## 2017-09-03 DIAGNOSIS — E213 Hyperparathyroidism, unspecified: Secondary | ICD-10-CM | POA: Diagnosis not present

## 2017-09-03 DIAGNOSIS — Z6830 Body mass index (BMI) 30.0-30.9, adult: Secondary | ICD-10-CM | POA: Diagnosis not present

## 2017-09-03 DIAGNOSIS — I10 Essential (primary) hypertension: Secondary | ICD-10-CM | POA: Diagnosis not present

## 2017-09-03 DIAGNOSIS — E7849 Other hyperlipidemia: Secondary | ICD-10-CM | POA: Diagnosis not present

## 2017-09-03 DIAGNOSIS — E669 Obesity, unspecified: Secondary | ICD-10-CM | POA: Diagnosis not present

## 2017-09-03 DIAGNOSIS — Z1389 Encounter for screening for other disorder: Secondary | ICD-10-CM | POA: Diagnosis not present

## 2017-09-03 DIAGNOSIS — Z0000 Encounter for general adult medical examination without abnormal findings: Secondary | ICD-10-CM | POA: Diagnosis not present

## 2017-09-03 DIAGNOSIS — N183 Chronic kidney disease, stage 3 (moderate): Secondary | ICD-10-CM | POA: Diagnosis not present

## 2017-09-03 DIAGNOSIS — E1129 Type 2 diabetes mellitus with other diabetic kidney complication: Secondary | ICD-10-CM | POA: Diagnosis not present

## 2017-09-03 DIAGNOSIS — M1990 Unspecified osteoarthritis, unspecified site: Secondary | ICD-10-CM | POA: Diagnosis not present

## 2017-09-03 DIAGNOSIS — I129 Hypertensive chronic kidney disease with stage 1 through stage 4 chronic kidney disease, or unspecified chronic kidney disease: Secondary | ICD-10-CM | POA: Diagnosis not present

## 2017-09-11 DIAGNOSIS — Z1212 Encounter for screening for malignant neoplasm of rectum: Secondary | ICD-10-CM | POA: Diagnosis not present

## 2017-10-11 DIAGNOSIS — Z23 Encounter for immunization: Secondary | ICD-10-CM | POA: Diagnosis not present

## 2018-01-26 ENCOUNTER — Emergency Department (HOSPITAL_COMMUNITY): Payer: Medicare Other

## 2018-01-26 ENCOUNTER — Emergency Department (HOSPITAL_COMMUNITY)
Admission: EM | Admit: 2018-01-26 | Discharge: 2018-01-26 | Disposition: A | Discharge status: Home / Self Care | Payer: Medicare Other | Attending: Emergency Medicine | Admitting: Emergency Medicine

## 2018-01-26 ENCOUNTER — Encounter (HOSPITAL_COMMUNITY): Payer: Self-pay | Admitting: *Deleted

## 2018-01-26 DIAGNOSIS — Z7982 Long term (current) use of aspirin: Secondary | ICD-10-CM | POA: Insufficient documentation

## 2018-01-26 DIAGNOSIS — B029 Zoster without complications: Secondary | ICD-10-CM | POA: Diagnosis not present

## 2018-01-26 DIAGNOSIS — Z7902 Long term (current) use of antithrombotics/antiplatelets: Secondary | ICD-10-CM | POA: Diagnosis not present

## 2018-01-26 DIAGNOSIS — Z96619 Presence of unspecified artificial shoulder joint: Secondary | ICD-10-CM | POA: Insufficient documentation

## 2018-01-26 DIAGNOSIS — Z87891 Personal history of nicotine dependence: Secondary | ICD-10-CM | POA: Diagnosis not present

## 2018-01-26 DIAGNOSIS — Z794 Long term (current) use of insulin: Secondary | ICD-10-CM | POA: Insufficient documentation

## 2018-01-26 DIAGNOSIS — I10 Essential (primary) hypertension: Secondary | ICD-10-CM | POA: Insufficient documentation

## 2018-01-26 DIAGNOSIS — E119 Type 2 diabetes mellitus without complications: Secondary | ICD-10-CM | POA: Insufficient documentation

## 2018-01-26 DIAGNOSIS — R51 Headache: Secondary | ICD-10-CM | POA: Diagnosis not present

## 2018-01-26 MED ORDER — TRAMADOL HCL 50 MG PO TABS: 50.0000 mg | ORAL_TABLET | Freq: Four times a day (QID) | ORAL | 0 refills | Status: DC | PRN

## 2018-01-26 MED ORDER — TETRACAINE HCL 0.5 % OP SOLN
1.0000 [drp] | Freq: Once | OPHTHALMIC | Status: AC
  Administered 2018-01-26: 1 [drp] via OPHTHALMIC
  Filled 2018-01-26: qty 4

## 2018-01-26 MED ORDER — MORPHINE SULFATE (PF) 4 MG/ML IV SOLN
4.0000 mg | Freq: Once | INTRAVENOUS | Status: AC
  Administered 2018-01-26: 4 mg via INTRAVENOUS
  Filled 2018-01-26: qty 1

## 2018-01-26 MED ORDER — GABAPENTIN 100 MG PO CAPS: 100.0000 mg | ORAL_CAPSULE | Freq: Three times a day (TID) | ORAL | 0 refills | Status: DC

## 2018-01-26 MED ORDER — ONDANSETRON HCL 4 MG/2ML IJ SOLN
4.0000 mg | Freq: Once | INTRAMUSCULAR | Status: AC
  Administered 2018-01-26: 4 mg via INTRAVENOUS
  Filled 2018-01-26: qty 2

## 2018-01-26 MED ORDER — ACYCLOVIR 400 MG PO TABS: 800.0000 mg | ORAL_TABLET | Freq: Every day | ORAL | 0 refills | Status: AC

## 2018-01-26 MED ORDER — PREDNISONE 10 MG (21) PO TBPK: ORAL_TABLET | Freq: Every day | ORAL | 0 refills | Status: DC

## 2018-01-26 MED ORDER — FLUORESCEIN SODIUM 1 MG OP STRP
1.0000 | ORAL_STRIP | Freq: Once | OPHTHALMIC | Status: AC
  Administered 2018-01-26: 1 via OPHTHALMIC
  Filled 2018-01-26: qty 1

## 2018-01-26 NOTE — Discharge Instructions (Addendum)
You can take 1000 mg of Tylenol.  Do not exceed 4000 mg of Tylenol a day.  Take pain medications as directed for break through pain. Do not drive or operate machinery while taking this medication.   Take Prednisone as directed.   Take anti-viral medication as directed.   You can try the Gabapentin for pain. If it does not help, you can try Tramadol. Do no take them at the same time.   Follow-up with your primary care doctor in the next 3-4 days.   Return emergency department for any worsening pain, vision changes, hearing loss, fever, difficulty moving her arms or legs or any other worsening or concerning symptoms.

## 2018-01-26 NOTE — ED Notes (Signed)
Patient verbalizes understanding of discharge instructions. Opportunity for questioning and answers were provided. Armband removed by staff, pt discharged from ED.  

## 2018-01-26 NOTE — ED Triage Notes (Signed)
Pt in c/o severe headache to her right temporal area and behind her right eye for the last several days, states she had a sinus infection before this started, c/o continued facial pain as well, reports nausea, denies vomiting, denies vision changes or weakness

## 2018-01-26 NOTE — ED Provider Notes (Signed)
[place] [facility] EMERGENCY DEPARTMENT Provider Note   CSN: [id number] Arrival date & time: 01/26/18  1017     History   Chief Complaint Chief Complaint  Patient presents with  . Headache    HPI Allison Duffy is a 84 y.o. female past medical history of gout, hyperlipidemia, hypertension who presents for evaluation of headache that is been ongoing for the last 2 days.  She states headache was gradual in nature and continually worsened.  No thunderclap headache.  She denies any preceding trauma, injury.  Patient reports that she had been having some sinus issues about 2 or 3 days prior to onset of headache and she saw her primary care doctor who prescribed her antibiotics for sinus pain.  Patient reports that she is continued to have pain despite taking antibiotics.  She reports she has been taking Tylenol with no improvement.  Patient does report that last night, she felt like she was having some pain and swelling to her lower face but states that is improved.  She states that the headache is constant nature and is only located on the right upper side towards the eye.  She states that every once in a while she will get a sharp shooting pain that sends back.  Patient states she has not had any fever.  She denies any vision changes, numbness/weakness of her arms or legs, vomiting.  Patient does report a history of migraines as a teenager but states she has not had them in several years.  The history is provided by the patient.    Past Medical History:  Diagnosis Date  . Arthritis    "scattered joints" (07/29/2013)  . Chest pain   . Colitis   . Gout attack ?1980's  . Hyperlipidemia   . Hypertension   . PONV (postoperative nausea and vomiting)   . Type II diabetes mellitus [facility])     Patient Active Problem List   Diagnosis Date Noted  . Colitis, acute 05/31/2016  . Hematochezia 05/31/2016  . Primary osteoarthritis of right shoulder 12/27/2014  . Pulmonary nodule  06/23/2014  . Viral gastroenteritis 06/23/2014  . Viral gastritis 06/23/2014  . Septic olecranon bursitis 07/29/2013  . Essential hypertension 07/05/2013  . Hyperlipidemia 07/05/2013  . Type 2 diabetes mellitus ([place]) 07/05/2013  . Chest pain 07/05/2013    Past Surgical History:  Procedure Laterality Date  . APPENDECTOMY    . CARPAL TUNNEL RELEASE Right   . CATARACT EXTRACTION W/ INTRAOCULAR LENS  IMPLANT, BILATERAL Bilateral   . CHOLECYSTECTOMY    . KNEE ARTHROSCOPY Right   . OLECRANON BURSECTOMY Left 07/29/2013   "in ER"  . PARATHYROIDECTOMY    . TONSILLECTOMY    . TOTAL SHOULDER REPLACEMENT  2017  . VAGINAL HYSTERECTOMY       OB History   No obstetric history on file.      Home Medications    Prior to Admission medications   Medication Sig Start Date End Date Taking? Authorizing Provider  acetaminophen (TYLENOL) 500 MG tablet Take 1,000 mg by mouth every 6 (six) hours as needed.    [provider]  acyclovir (ZOVIRAX) 400 MG tablet Take 2 tablets (800 mg total) by mouth 5 (five) times daily for 7 days. 01/26/18 02/02/18  Volanda Napoleon, PA-C  aluminum-magnesium hydroxide-simethicone (MAALOX) [phone] MG/5ML SUSP Take 30 mLs by mouth 4 (four) times daily -  before meals and at bedtime.    [provider]  amLODipine (NORVASC) 10 MG tablet  Take 10 mg by mouth daily.    [provider]  ascorbic acid (VITAMIN C) 500 MG tablet Take 500 mg by mouth 3 (three) times daily.    [provider]  aspirin 81 MG tablet Take 81 mg by mouth 2 (two) times daily.     [provider]  Calcium Carbonate (CALCIUM 600 PO) Take 1,200 mg by mouth daily.    [provider]  cholecalciferol (VITAMIN D) 1000 UNITS tablet Take 1,000 Units by mouth daily.    [provider]  cloNIDine (CATAPRES) 0.1 MG tablet Take 0.1 mg by mouth 2 (two) times daily.    [provider]  furosemide (LASIX) 40 MG tablet Take 40 mg by mouth  daily.    [provider]  gabapentin (NEURONTIN) 100 MG capsule Take 1 capsule (100 mg total) by mouth 3 (three) times daily. 01/26/18   Volanda Napoleon, PA-C  Glucosamine HCl (GLUCOSAMINE PO) Take 2,000 mg by mouth daily.    [provider]  insulin aspart (NOVOLOG) 100 UNIT/ML injection Inject 6 Units into the skin 3 (three) times daily with meals. 06/06/16   Johnson, Clanford L, MD  insulin glargine (LANTUS) 100 UNIT/ML injection Inject 0.15 mLs (15 Units total) into the skin every morning. 06/07/16   Johnson, Clanford L, MD  lisinopril (PRINIVIL,ZESTRIL) 40 MG tablet Take 40 mg by mouth daily.    [provider]  magnesium oxide (MAG-OX) 400 MG tablet Take 400 mg by mouth daily.    [provider]  metFORMIN (GLUCOPHAGE) 500 MG tablet Take 500 mg by mouth daily with breakfast.     [provider]  metoprolol succinate (TOPROL-XL) 50 MG 24 hr tablet Take 50 mg by mouth 2 (two) times daily. 05/05/16   [provider]  Multiple Vitamin (MULTIVITAMIN WITH MINERALS) TABS Take 1 tablet by mouth 2 (two) times daily. Vitality Multivitamin & mineral    [provider]  omeprazole (PRILOSEC) 40 MG capsule Take 1 capsule (40 mg total) by mouth daily. 06/06/16   Johnson, Clanford L, MD  Potassium 99 MG TABS Take 99 mg by mouth daily.    [provider]  pravastatin (PRAVACHOL) 20 MG tablet Take 20 mg by mouth daily.    [provider]  predniSONE (STERAPRED UNI-PAK 21 TAB) 10 MG (21) TBPK tablet Take by mouth daily. Take 6 tabs by mouth daily  for 2 days, then 5 tabs for 2 days, then 4 tabs for 2 days, then 3 tabs for 2 days, 2 tabs for 2 days, then 1 tab by mouth daily for 2 days 01/26/18   Providence Lanius A, PA-C  sitaGLIPtin (JANUVIA) 100 MG tablet Take 100 mg by mouth daily.    [provider]  traMADol (ULTRAM) 50 MG tablet Take 1 tablet (50 mg total) by mouth every 6 (six) hours as needed. 01/26/18   Volanda Napoleon, PA-C  vitamin E 400 UNIT capsule Take 400 Units by mouth daily.    [provider]    Family History Family History  Problem Relation Age of Onset  . Hypertension Father   . Heart disease Father   . Heart disease Mother   . Diabetes Mother   . Hypertension Mother   . Heart disease Brother        both brothers  . Diabetes Brother   . Hypertension Brother     Social History Social History   Tobacco Use  . Smoking status: Former Smoker  Years: 5.00    Types: Cigarettes    Last attempt to quit: 07/05/1953    Years since quitting: 64.6  . Smokeless tobacco: Never Used  Substance Use Topics  . Alcohol use: No  . Drug use: No     Allergies   Codeine; Hydrocodone; and Sulfa antibiotics   Review of Systems Review of Systems  Constitutional: Negative for fever.  Eyes: Negative for visual disturbance.  Respiratory: Negative for cough and shortness of breath.   Cardiovascular: Negative for chest pain.  Gastrointestinal: Negative for abdominal pain, nausea and vomiting.  Neurological: Positive for headaches. Negative for weakness and numbness.  All other systems reviewed and are negative.    Physical Exam Updated Vital Signs BP (!) 157/77   Pulse 77   Temp 97.9 F (36.6 C) (Oral)   Resp (!) 21   SpO2 93%   Physical Exam Vitals signs and nursing note reviewed.  Constitutional:      Appearance: Normal appearance. She is well-developed.  HENT:     Head: Normocephalic and atraumatic.      Comments: Several small scattered erythematous vesicles noted to the right forehead that extends to the right temporal area.  There appears to be a small developing one on the upper eyelid.  These vesicles do not cross over to the dermatome.  No tenderness palpation noted to temporal artery.  Patient/depression and lateral movement of mandible intact with any difficulty.  Face is symmetric in appearance without any overlying warmth, erythema. Eyes:     General: Lids are  normal.     Extraocular Movements: Extraocular movements intact.     Conjunctiva/sclera: Conjunctivae normal.     Pupils: Pupils are equal, round, and reactive to light.     Comments: EOMs intact without any difficulty.  Evaluation with Sherral Hammers lamp shows no evidence of fluorescein uptake, dendritic lesions. PERRL.   Neck:     Musculoskeletal: Full passive range of motion without pain and neck supple. No neck rigidity.     Comments: Neck is supple without any rigidity. Cardiovascular:     Rate and Rhythm: Normal rate and regular rhythm.     Pulses: Normal pulses.     Heart sounds: Normal heart sounds. No murmur. No friction rub. No gallop.   Pulmonary:     Effort: Pulmonary effort is normal.     Breath sounds: Normal breath sounds.  Abdominal:     Palpations: Abdomen is soft. Abdomen is not rigid.     Tenderness: There is no abdominal tenderness. There is no guarding.  Musculoskeletal: Normal range of motion.  Skin:    General: Skin is warm and dry.     Capillary Refill: Capillary refill takes less than 2 seconds.  Neurological:     Mental Status: She is alert and oriented to person, place, and time.     Comments: Cranial nerves III-XII intact Follows commands, Moves all extremities  5/5 strength to BUE and BLE  Sensation intact throughout all major nerve distributions Normal finger to nose. No dysdiadochokinesia. No pronator drift No slurred speech. No facial droop.   Psychiatric:        Speech: Speech normal.      ED Treatments / Results  Labs (all labs ordered are listed, but only abnormal results are displayed) Labs Reviewed - No data to display  EKG None  Radiology Ct Head Wo Contrast  Result Date: 01/26/2018 CLINICAL DATA:  Severe headache EXAM: CT HEAD WITHOUT CONTRAST TECHNIQUE: Contiguous axial images were obtained  from the base of the skull through the vertex without intravenous contrast. COMPARISON:  Head CT January 14, 2008 and brain MRI January 14, 2008  FINDINGS: Brain: There is mild diffuse atrophy. There is no intracranial mass, hemorrhage, extra-axial fluid collection, or midline shift. There is mild patchy small vessel disease in the centra semiovale bilaterally. No acute infarct evident. Vascular: No hyperdense vessel evident. There is calcification in each distal vertebral artery and in each carotid siphon region. Skull: The bony calvarium appears intact. Sinuses/Orbits: There is mucosal thickening in several ethmoid air cells. Other visualized paranasal sinuses are clear. There is rightward deviation of the nasal septum. Orbits appear symmetric bilaterally. Other: Mastoid air cells are clear. IMPRESSION: Atrophy with mild patchy periventricular small vessel disease. No acute infarct. No mass or hemorrhage. There are foci of arterial vascular calcification. There is mucosal thickening in several ethmoid air cells. There is rightward deviation of the nasal septum. Electronically Signed   By: Lowella Grip III M.D.   On: 01/26/2018 11:32    Procedures Procedures (including critical care time)  Medications Ordered in ED Medications  tetracaine (PONTOCAINE) 0.5 % ophthalmic solution 1 drop (1 drop Both Eyes Given 01/26/18 1304)  fluorescein ophthalmic strip 1 strip (1 strip Both Eyes Given 01/26/18 1304)  ondansetron (ZOFRAN) injection 4 mg (4 mg Intravenous Given 01/26/18 1304)  morphine 4 MG/ML injection 4 mg (4 mg Intravenous Given 01/26/18 1304)     Initial Impression / Assessment and Plan / ED Course  I have reviewed the triage vital signs and the nursing notes.  Pertinent labs & imaging results that were available during my care of the patient were reviewed by me and considered in my medical decision making (see chart for details).     84 year old female who presents for evaluation of headache x2 days.  She had some sinus complaints and primary care doctor prescribe her some antibiotics.  No associated vision changes,  numbness/weakness, vomiting, fever. Patient is afebrile, non-toxic appearing, sitting comfortably on examination table. Vital signs reviewed and stable. No neuro deficits noted on exam.  Given right-sided headache as well as skin changes, concern for shingles.  History/physical exam not concerning for temporal arteritis, meningitis.  Will plan for analgesics here in the ED and Wood's lamp admission.  CT ordered at triage.  CT scan shows no acute infarct, intracranial normality.  There is some mucosal thickening in several ethmoid air cells.  Patient with Sherral Hammers lamp shows no evidence of fluorescein uptake, dendritic lesion.  Discussed patient with Dr. Sherry Ruffing who independently evaluated patient. Agrees with plan to treat as shingles.  Plan to give patient prednisone, gabapentin, acyclovir.  Additionally, patient states she cannot tolerate hydrocodone or oxycodone for pain.  I reviewed her record.  She has tolerated tramadol without any difficulties in the past.  We will send home with this.  Additionally, patient given outpatient ophthalmology referral.  Vital signs are stable.  She did have one dip in her O2 sat but this is shortly after the morphine.  Suspect this was related.   Discussed plan with patient.  She is agreeable. Patient had ample opportunity for questions and discussion. All patient's questions were answered with full understanding. At this time, patient exhibits no emergent life-threatening condition that require further evaluation in ED or admission.    Final Clinical Impressions(s) / ED Diagnoses   Final diagnoses:  Herpes zoster without complication    ED Discharge Orders         Ordered  acyclovir (ZOVIRAX) 400 MG tablet  5 times daily     01/26/18 1440    predniSONE (STERAPRED UNI-PAK 21 TAB) 10 MG (21) TBPK tablet  Daily     01/26/18 1440    traMADol (ULTRAM) 50 MG tablet  Every 6 hours PRN     01/26/18 1440    gabapentin (NEURONTIN) 100 MG capsule  3 times daily      01/26/18 1440           Desma Mcgregor 01/26/18 1801    Tegeler, Gwenyth Allegra, MD 01/29/18 [phone]

## 2018-01-26 NOTE — ED Notes (Signed)
ED Provider at bedside. 

## 2018-02-01 DIAGNOSIS — I10 Essential (primary) hypertension: Secondary | ICD-10-CM | POA: Diagnosis not present

## 2018-02-01 DIAGNOSIS — N183 Chronic kidney disease, stage 3 (moderate): Secondary | ICD-10-CM | POA: Diagnosis not present

## 2018-02-01 DIAGNOSIS — Z6829 Body mass index (BMI) 29.0-29.9, adult: Secondary | ICD-10-CM | POA: Diagnosis not present

## 2018-02-01 DIAGNOSIS — B029 Zoster without complications: Secondary | ICD-10-CM | POA: Diagnosis not present

## 2018-02-01 DIAGNOSIS — E1129 Type 2 diabetes mellitus with other diabetic kidney complication: Secondary | ICD-10-CM | POA: Diagnosis not present

## 2018-02-27 IMAGING — DX DG CHEST 1V PORT
1 series · 1 of 1 positions shown · non-contrast
Comparison: Report of a chest x-ray December 10, 2000.

CLINICAL DATA: Fever, former smoker, diabetes, hypertension,
hyperlipidemia.

EXAM:
PORTABLE CHEST 1 VIEW

[chest ap]
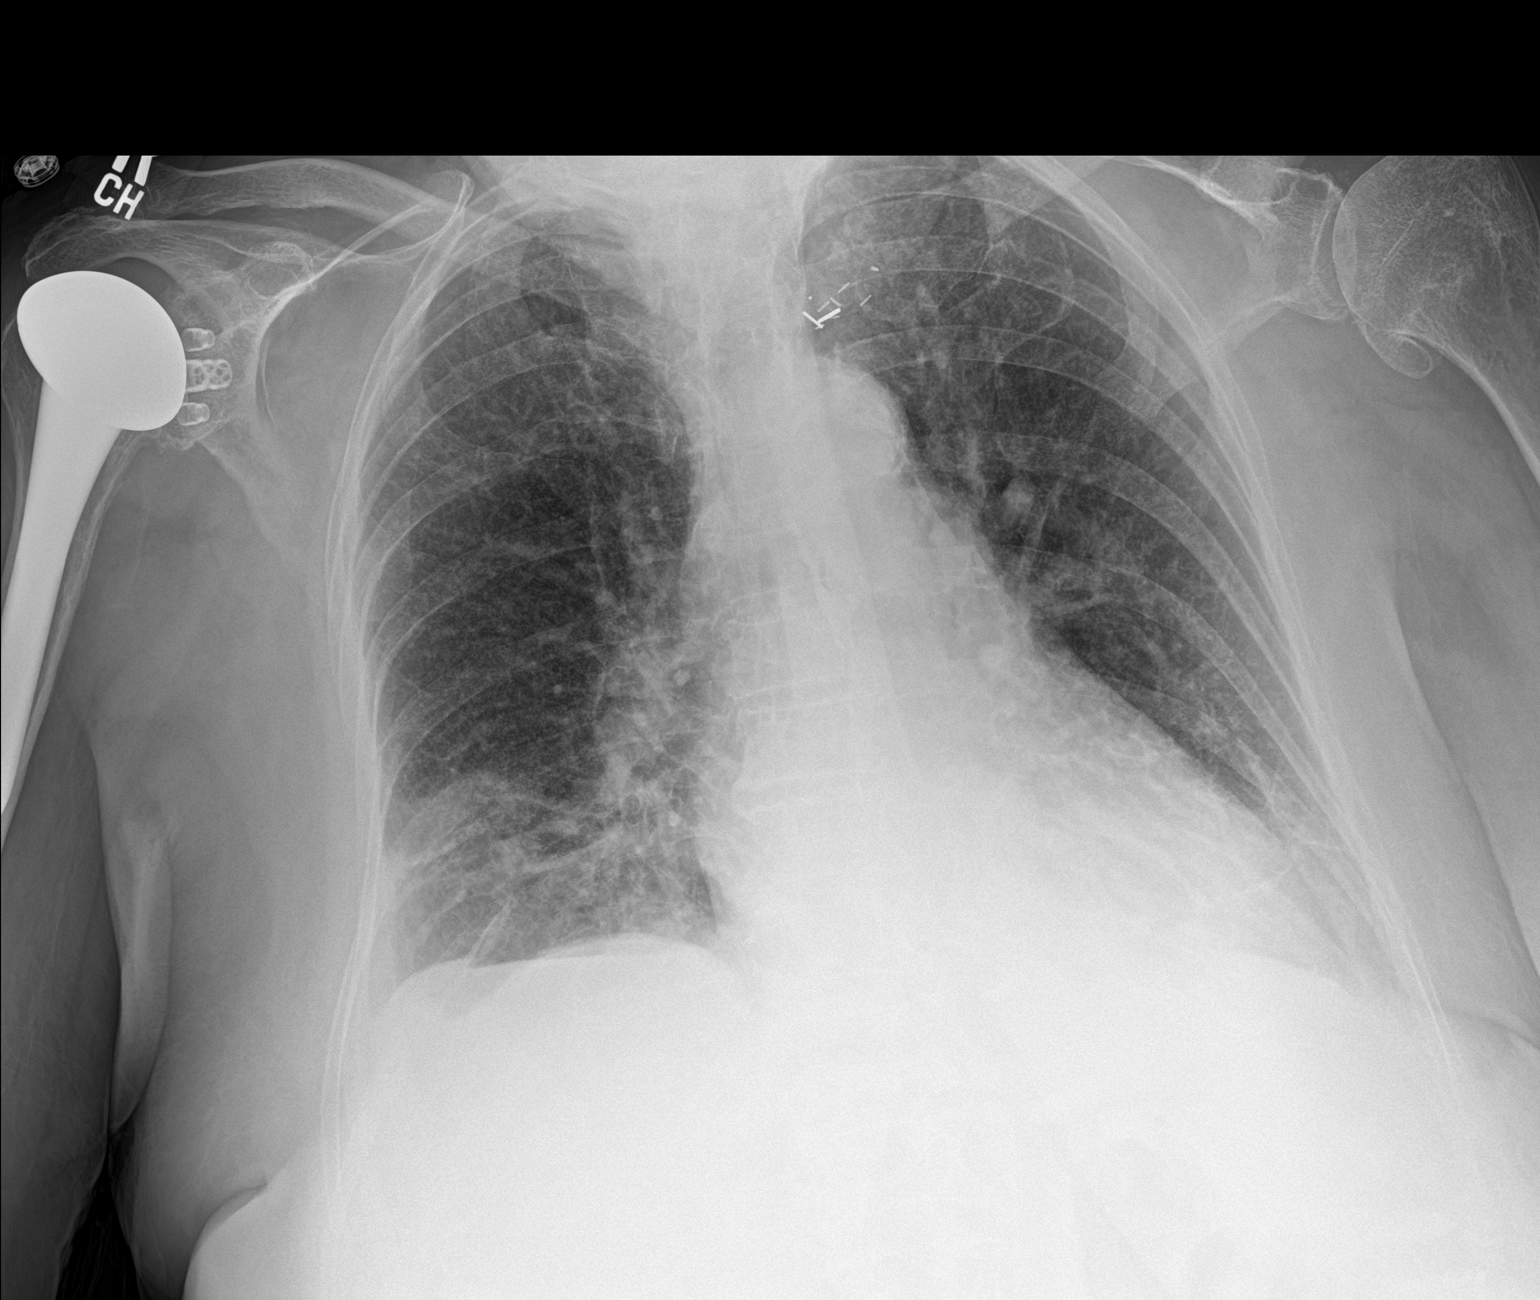

[1 of 1 positions shown; findings below may reference images not displayed]

FINDINGS: The lungs are well-expanded. The interstitial markings are increased
diffusely. Patchy parenchymal density is present at the lung bases.
There is no significant pleural effusion and no pneumothorax. The
heart is top-normal in size. The pulmonary vascularity is not
clearly engorged. There is calcification in the wall of the aortic
arch. There surgical clips in the left paratracheal region which
have been previously described.
IMPRESSION: Prominent interstitial markings likely reflects chronic
bronchitic-smoking related change. Superimposed increased density at
both lung bases suggests atelectasis or pneumonia. There is no overt
CHF. A PA and lateral chest x-ray is recommended when the patient
can tolerate the procedure.

Thoracic aortic atherosclerosis.

## 2018-02-28 IMAGING — DX DG CHEST 1V PORT
1 series · 1 of 1 positions shown · non-contrast
Comparison: June 05, 2016

CLINICAL DATA: Shortness of breath and cough

EXAM:
PORTABLE CHEST 1 VIEW

[chest ap]
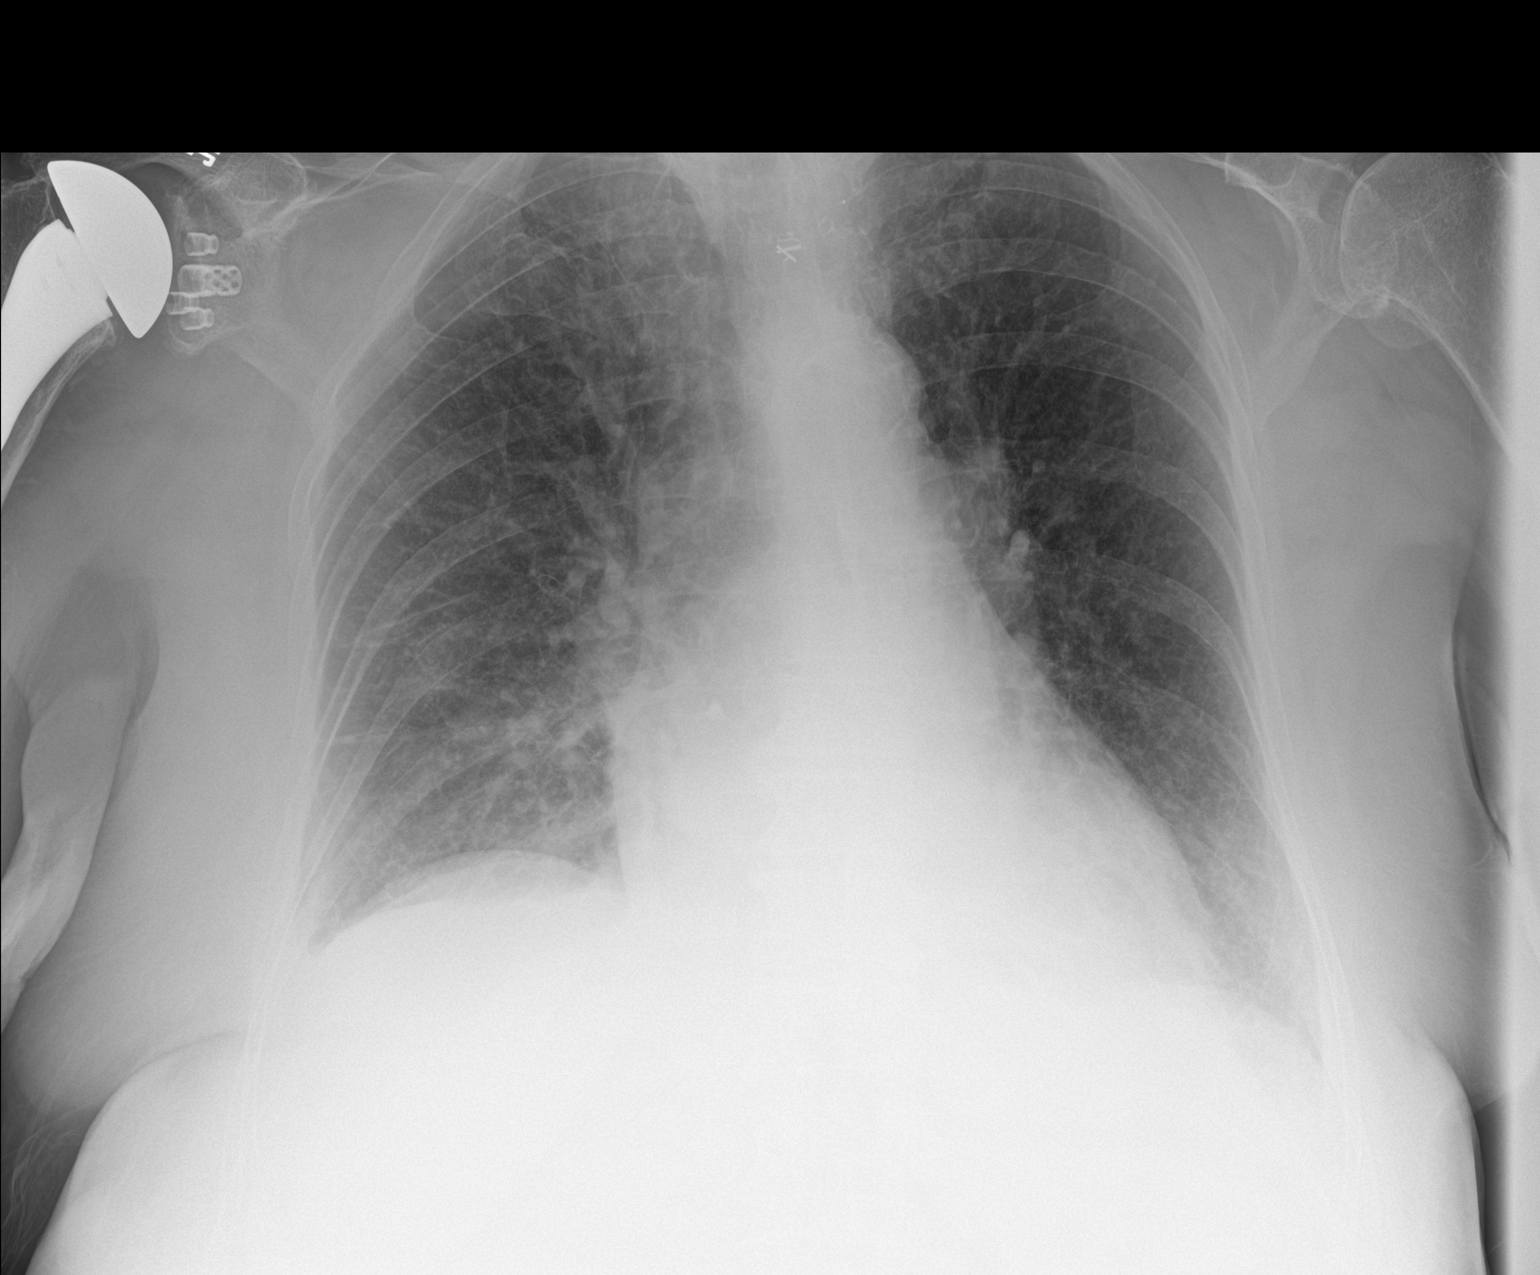

[1 of 1 positions shown; findings below may reference images not displayed]

FINDINGS: There is bibasilar atelectasis. No frank edema or consolidation.
Heart is mildly enlarged with pulmonary vascular within normal
limits. There is aortic atherosclerosis. Patient is status post
total shoulder replacement on the right.
IMPRESSION: Interval resolution of pulmonary edema. Bibasilar atelectasis.
Stable cardiac prominence. No new opacity. Aortic atherosclerosis.

## 2018-07-19 DIAGNOSIS — M1990 Unspecified osteoarthritis, unspecified site: Secondary | ICD-10-CM | POA: Diagnosis not present

## 2018-07-19 DIAGNOSIS — E213 Hyperparathyroidism, unspecified: Secondary | ICD-10-CM | POA: Diagnosis not present

## 2018-07-19 DIAGNOSIS — N183 Chronic kidney disease, stage 3 (moderate): Secondary | ICD-10-CM | POA: Diagnosis not present

## 2018-07-19 DIAGNOSIS — I129 Hypertensive chronic kidney disease with stage 1 through stage 4 chronic kidney disease, or unspecified chronic kidney disease: Secondary | ICD-10-CM | POA: Diagnosis not present

## 2018-07-19 DIAGNOSIS — Z1331 Encounter for screening for depression: Secondary | ICD-10-CM | POA: Diagnosis not present

## 2018-07-19 DIAGNOSIS — E1129 Type 2 diabetes mellitus with other diabetic kidney complication: Secondary | ICD-10-CM | POA: Diagnosis not present

## 2018-09-05 ENCOUNTER — Encounter (HOSPITAL_COMMUNITY): Payer: Self-pay | Admitting: Emergency Medicine

## 2018-09-05 ENCOUNTER — Other Ambulatory Visit: Payer: Self-pay

## 2018-09-05 ENCOUNTER — Emergency Department (HOSPITAL_COMMUNITY)
Admission: EM | Admit: 2018-09-05 | Discharge: 2018-09-05 | Disposition: A | Discharge status: Home / Self Care | Payer: Medicare Other | Attending: Emergency Medicine | Admitting: Emergency Medicine

## 2018-09-05 DIAGNOSIS — Z7982 Long term (current) use of aspirin: Secondary | ICD-10-CM | POA: Diagnosis not present

## 2018-09-05 DIAGNOSIS — E119 Type 2 diabetes mellitus without complications: Secondary | ICD-10-CM | POA: Diagnosis not present

## 2018-09-05 DIAGNOSIS — Y929 Unspecified place or not applicable: Secondary | ICD-10-CM | POA: Diagnosis not present

## 2018-09-05 DIAGNOSIS — S70362A Insect bite (nonvenomous), left thigh, initial encounter: Secondary | ICD-10-CM | POA: Diagnosis not present

## 2018-09-05 DIAGNOSIS — W57XXXA Bitten or stung by nonvenomous insect and other nonvenomous arthropods, initial encounter: Secondary | ICD-10-CM | POA: Diagnosis not present

## 2018-09-05 DIAGNOSIS — Z794 Long term (current) use of insulin: Secondary | ICD-10-CM | POA: Insufficient documentation

## 2018-09-05 DIAGNOSIS — Y999 Unspecified external cause status: Secondary | ICD-10-CM | POA: Diagnosis not present

## 2018-09-05 DIAGNOSIS — Z79899 Other long term (current) drug therapy: Secondary | ICD-10-CM | POA: Insufficient documentation

## 2018-09-05 DIAGNOSIS — I10 Essential (primary) hypertension: Secondary | ICD-10-CM | POA: Insufficient documentation

## 2018-09-05 DIAGNOSIS — Y939 Activity, unspecified: Secondary | ICD-10-CM | POA: Diagnosis not present

## 2018-09-05 LAB — CBC WITH DIFFERENTIAL/PLATELET
Abs Immature Granulocytes: 0.02 10*3/uL (ref 0.00–0.07)
Basophils Absolute: 0.1 10*3/uL (ref 0.0–0.1)
Basophils Relative: 1 %
Eosinophils Absolute: 0.2 10*3/uL (ref 0.0–0.5)
Eosinophils Relative: 2 %
HCT: 38.1 % (ref 36.0–46.0)
Hemoglobin: 13.3 g/dL (ref 12.0–15.0)
Immature Granulocytes: 0 %
Lymphocytes Relative: 31 %
Lymphs Abs: 2.8 10*3/uL (ref 0.7–4.0)
MCH: 33.4 pg (ref 26.0–34.0)
MCHC: 34.9 g/dL (ref 30.0–36.0)
MCV: 95.7 fL (ref 80.0–100.0)
Monocytes Absolute: 0.8 10*3/uL (ref 0.1–1.0)
Monocytes Relative: 9 %
Neutro Abs: 5.2 10*3/uL (ref 1.7–7.7)
Neutrophils Relative %: 57 %
Platelets: 213 10*3/uL (ref 150–400)
RBC: 3.98 MIL/uL (ref 3.87–5.11)
RDW: 11.8 % (ref 11.5–15.5)
WBC: 9 10*3/uL (ref 4.0–10.5)
nRBC: 0 % (ref 0.0–0.2)

## 2018-09-05 LAB — BASIC METABOLIC PANEL
Anion gap: 13 (ref 5–15)
BUN: 18 mg/dL (ref 8–23)
CO2: 23 mmol/L (ref 22–32)
Calcium: 9.3 mg/dL (ref 8.9–10.3)
Chloride: 104 mmol/L (ref 98–111)
Creatinine, Ser: 0.96 mg/dL (ref 0.44–1.00)
GFR calc Af Amer: >60 mL/min (ref >60)
GFR calc non Af Amer: 52 mL/min — ABNORMAL LOW (ref >60)
Glucose, Bld: 152 mg/dL — ABNORMAL HIGH (ref 70–99)
Potassium: 3.8 mmol/L (ref 3.5–5.1)
Sodium: 140 mmol/L (ref 135–145)

## 2018-09-05 NOTE — Discharge Instructions (Addendum)
Drink plenty of fluids.  See your Physician for recheck this week.

## 2018-09-05 NOTE — ED Triage Notes (Signed)
Pt c/o insect bite to back of left thigh x 3 days. States initially the area was very painful with blisters. Area red, no swelling or blisters noted at this time.

## 2018-09-05 NOTE — ED Provider Notes (Signed)
[place] EMERGENCY DEPARTMENT Provider Note   CSN: [id number] Arrival date & time: 09/05/18  1541     History   Chief Complaint Chief Complaint  Patient presents with  . Insect Bite    HPI Allison Duffy is a 85 y.o. female.     Pt complains of an insect bite to the back of her left leg.  Pt reports area has been painful for several days.  Pt reports she felt ill earlier in the week.  Pt reports she had a decreased appetite.  Pt admits to decreased fluid intake.  Pt's daughter is concerned patient could have been bitten by a poisonous spider.  There is concerned that patient could be dehydrated.  Patient reports she is feeling better today than she has in the past few days.  She states she is eating and drinking today patient denies any cough she has not had any sore throat she denies any fever no chest pain no focal weakness.  The history is provided by the patient. No language interpreter was used.    Past Medical History:  Diagnosis Date  . Arthritis    "scattered joints" (07/29/2013)  . Chest pain   . Colitis   . Gout attack ?1980's  . Hyperlipidemia   . Hypertension   . PONV (postoperative nausea and vomiting)   . Type II diabetes mellitus [facility] [place])     Patient Active Problem List   Diagnosis Date Noted  . Colitis, acute 05/31/2016  . Hematochezia 05/31/2016  . Primary osteoarthritis of right shoulder 12/27/2014  . Pulmonary nodule 06/23/2014  . Viral gastroenteritis 06/23/2014  . Viral gastritis 06/23/2014  . Septic olecranon bursitis 07/29/2013  . Essential hypertension 07/05/2013  . Hyperlipidemia 07/05/2013  . Type 2 diabetes mellitus ([place]) 07/05/2013  . Chest pain 07/05/2013    Past Surgical History:  Procedure Laterality Date  . APPENDECTOMY    . CARPAL TUNNEL RELEASE Right   . CATARACT EXTRACTION W/ INTRAOCULAR LENS  IMPLANT, BILATERAL Bilateral   . CHOLECYSTECTOMY    . KNEE ARTHROSCOPY Right   . OLECRANON BURSECTOMY Left  07/29/2013   "in ER"  . PARATHYROIDECTOMY    . TONSILLECTOMY    . TOTAL SHOULDER REPLACEMENT  2017  . VAGINAL HYSTERECTOMY       OB History   No obstetric history on file.      Home Medications    Prior to Admission medications   Medication Sig Start Date End Date Taking? Authorizing Provider  acetaminophen (TYLENOL) 500 MG tablet Take 1,000 mg by mouth every 6 (six) hours as needed.    [provider]  aluminum-magnesium hydroxide-simethicone (MAALOX) 200-200-20 MG/5ML SUSP Take 30 mLs by mouth 4 (four) times daily -  before meals and at bedtime.    [provider]  amLODipine (NORVASC) 10 MG tablet Take 10 mg by mouth daily.    [provider]  ascorbic acid (VITAMIN C) 500 MG tablet Take 500 mg by mouth 3 (three) times daily.    [provider]  aspirin 81 MG tablet Take 81 mg by mouth 2 (two) times daily.     [provider]  Calcium Carbonate (CALCIUM 600 PO) Take 1,200 mg by mouth daily.    [provider]  cholecalciferol (VITAMIN D) 1000 UNITS tablet Take 1,000 Units by mouth daily.    [provider]  cloNIDine (CATAPRES) 0.1 MG tablet Take 0.1 mg by mouth 2 (two) times daily.    [provider]  furosemide (LASIX) 40 MG tablet Take 40 mg by mouth daily.    [provider]  gabapentin (NEURONTIN) 100 MG capsule Take 1 capsule (100 mg total) by mouth 3 (three) times daily. 01/26/18   Volanda Napoleon, PA-C  Glucosamine HCl (GLUCOSAMINE PO) Take 2,000 mg by mouth daily.    [provider]  insulin aspart (NOVOLOG) 100 UNIT/ML injection Inject 6 Units into the skin 3 (three) times daily with meals. 06/06/16   Johnson, Clanford L, MD  insulin glargine (LANTUS) 100 UNIT/ML injection Inject 0.15 mLs (15 Units total) into the skin every morning. 06/07/16   Johnson, Clanford L, MD  lisinopril (PRINIVIL,ZESTRIL) 40 MG tablet Take 40 mg by mouth daily.    [provider]  magnesium oxide  (MAG-OX) 400 MG tablet Take 400 mg by mouth daily.    [provider]  metFORMIN (GLUCOPHAGE) 500 MG tablet Take 500 mg by mouth daily with breakfast.     [provider]  metoprolol succinate (TOPROL-XL) 50 MG 24 hr tablet Take 50 mg by mouth 2 (two) times daily. 05/05/16   [provider]  Multiple Vitamin (MULTIVITAMIN WITH MINERALS) TABS Take 1 tablet by mouth 2 (two) times daily. Vitality Multivitamin & mineral    [provider]  omeprazole (PRILOSEC) 40 MG capsule Take 1 capsule (40 mg total) by mouth daily. 06/06/16   Johnson, Clanford L, MD  Potassium 99 MG TABS Take 99 mg by mouth daily.    [provider]  pravastatin (PRAVACHOL) 20 MG tablet Take 20 mg by mouth daily.    [provider]  predniSONE (STERAPRED UNI-PAK 21 TAB) 10 MG (21) TBPK tablet Take by mouth daily. Take 6 tabs by mouth daily  for 2 days, then 5 tabs for 2 days, then 4 tabs for 2 days, then 3 tabs for 2 days, 2 tabs for 2 days, then 1 tab by mouth daily for 2 days 01/26/18   Providence Lanius A, PA-C  sitaGLIPtin (JANUVIA) 100 MG tablet Take 100 mg by mouth daily.    [provider]  traMADol (ULTRAM) 50 MG tablet Take 1 tablet (50 mg total) by mouth every 6 (six) hours as needed. 01/26/18   Volanda Napoleon, PA-C  vitamin E 400 UNIT capsule Take 400 Units by mouth daily.    [provider]    Family History Family History  Problem Relation Age of Onset  . Hypertension Father   . Heart disease Father   . Heart disease Mother   . Diabetes Mother   . Hypertension Mother   . Heart disease Brother        both brothers  . Diabetes Brother   . Hypertension Brother     Social History Social History   Tobacco Use  . Smoking status: Former Smoker    Years: 5.00    Types: Cigarettes    Quit date: 07/05/1953    Years since quitting: 65.2  . Smokeless tobacco: Never Used  Substance Use Topics  . Alcohol use: No  . Drug use: No     Allergies    Codeine, Hydrocodone, and Sulfa antibiotics   Review of Systems Review of Systems  All other systems reviewed and are negative.    Physical Exam Updated Vital Signs BP (!) 172/56   Pulse 70   Temp 98.6 F (37 C) (Oral)   Resp 16   SpO2 97%   Physical Exam Vitals signs reviewed.  Cardiovascular:  Rate and Rhythm: Normal rate.  Pulmonary:     Effort: Pulmonary effort is normal.  Musculoskeletal: Normal range of motion.  Skin:    General: Skin is warm.     Comments: 2cm dark area back of leg,  No redness, no fluctuance   Neurological:     General: No focal deficit present.     Mental Status: She is alert.  Psychiatric:        Mood and Affect: Mood normal.      ED Treatments / Results  Labs (all labs ordered are listed, but only abnormal results are displayed) Labs Reviewed  BASIC METABOLIC PANEL - Abnormal; Notable for the following components:      Result Value   Glucose, Bld 152 (*)    GFR calc non Af Amer 52 (*)    All other components within normal limits  CBC WITH DIFFERENTIAL/PLATELET    EKG None  Radiology No results found.  Procedures Procedures (including critical care time)  Medications Ordered in ED Medications - No data to display   Initial Impression / Assessment and Plan / ED Course  I have reviewed the triage vital signs and the nursing notes.  Pertinent labs & imaging results that were available during my care of the patient were reviewed by me and considered in my medical decision making (see chart for details).       MDM   Labs reviewed,  Pt and daughter counseled.  I advised follow up with primary care MD for recheck in 2-3 days    Final Clinical Impressions(s) / ED Diagnoses   Final diagnoses:  Insect bite of left thigh, initial encounter    ED Discharge Orders    None    An After Visit Summary was printed and given to the patient.    Sidney Ace 09/05/18 1839    Quintella Reichert, MD 09/06/18  [phone]

## 2018-09-05 NOTE — ED Notes (Signed)
Tech attempted to draw blood. Tech was unsuccessful. Tech notified [place], Therapist, sports.

## 2018-09-14 DIAGNOSIS — K529 Noninfective gastroenteritis and colitis, unspecified: Secondary | ICD-10-CM | POA: Diagnosis not present

## 2018-09-14 DIAGNOSIS — Z79899 Other long term (current) drug therapy: Secondary | ICD-10-CM | POA: Diagnosis not present

## 2018-09-14 DIAGNOSIS — N183 Chronic kidney disease, stage 3 (moderate): Secondary | ICD-10-CM | POA: Diagnosis not present

## 2018-09-14 DIAGNOSIS — E1129 Type 2 diabetes mellitus with other diabetic kidney complication: Secondary | ICD-10-CM | POA: Diagnosis not present

## 2018-09-14 DIAGNOSIS — I129 Hypertensive chronic kidney disease with stage 1 through stage 4 chronic kidney disease, or unspecified chronic kidney disease: Secondary | ICD-10-CM | POA: Diagnosis not present

## 2018-09-14 DIAGNOSIS — Z23 Encounter for immunization: Secondary | ICD-10-CM | POA: Diagnosis not present

## 2018-09-22 IMAGING — CR DG SHOULDER 2+V*R*
3 series · 3 of 3 positions shown · non-contrast
Comparison: 06/06/2016 chest x-ray.

CLINICAL DATA: 89-year-old female post fall. Right shoulder pain.
Initial encounter.

EXAM:
RIGHT SHOULDER - 2+ VIEW

[w shoulder internal right]
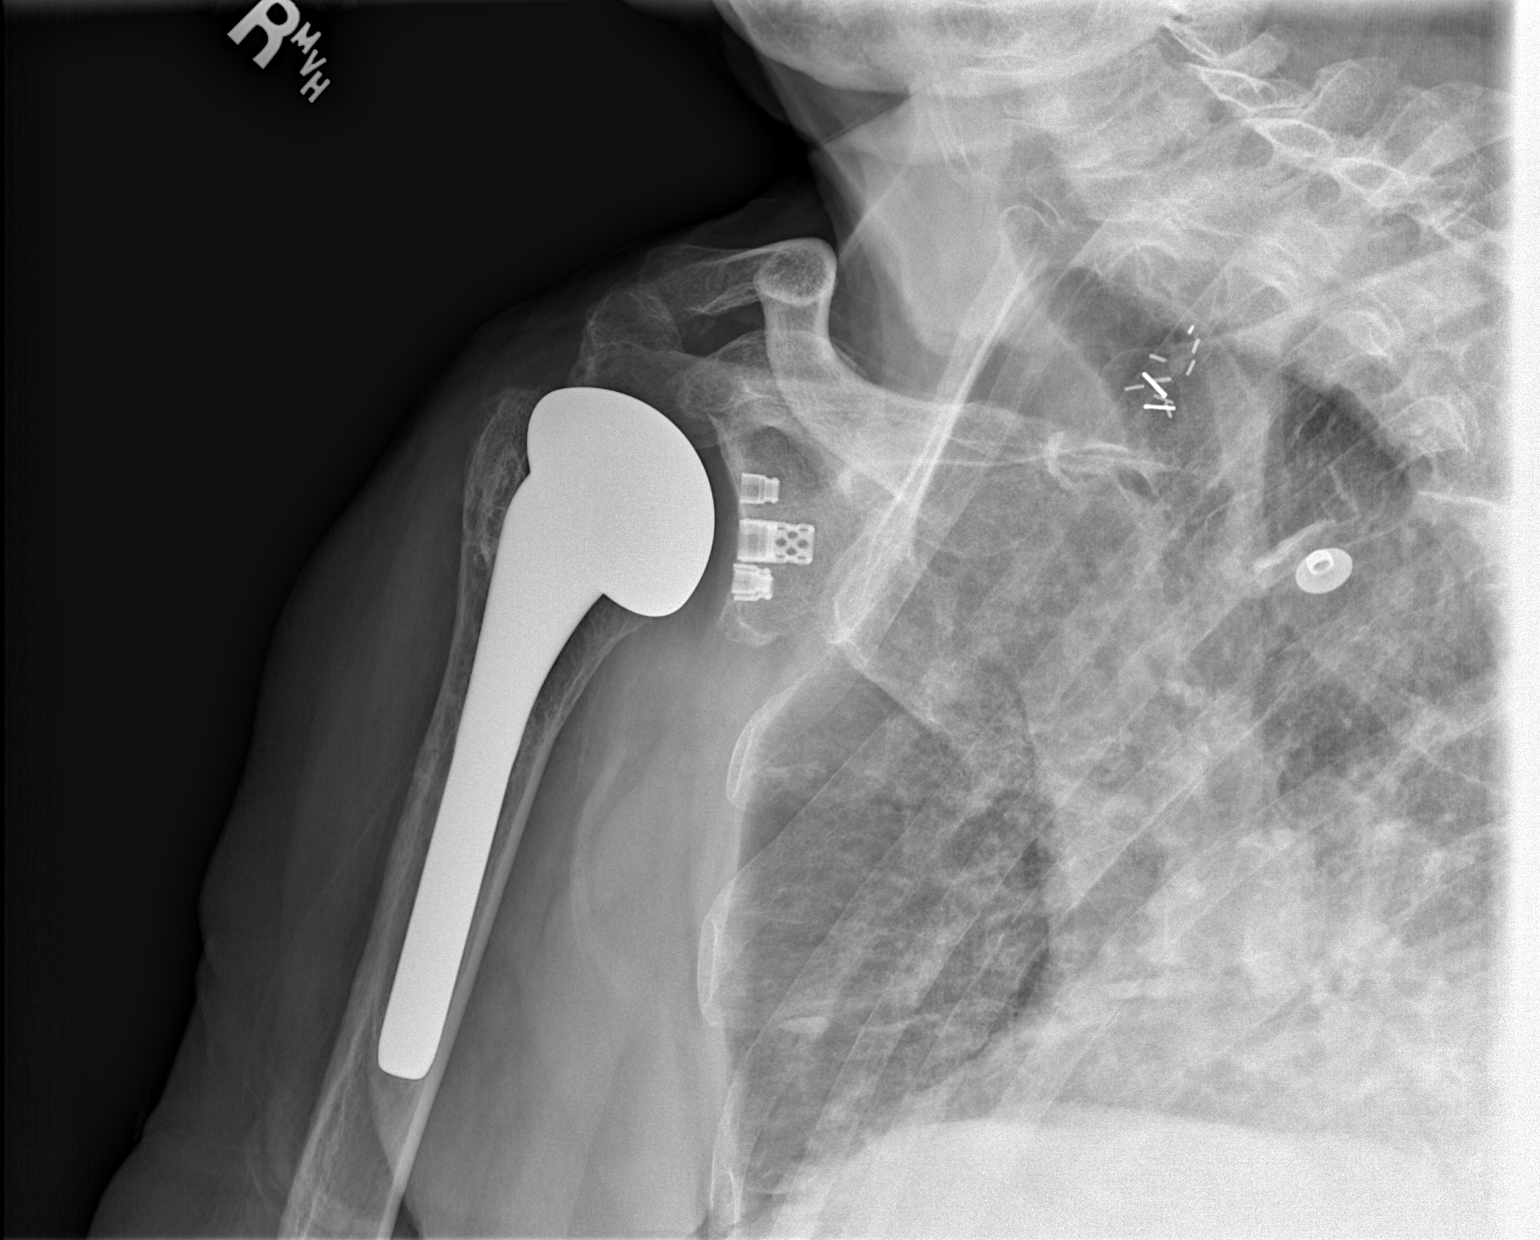

[w shoulder axillary right]
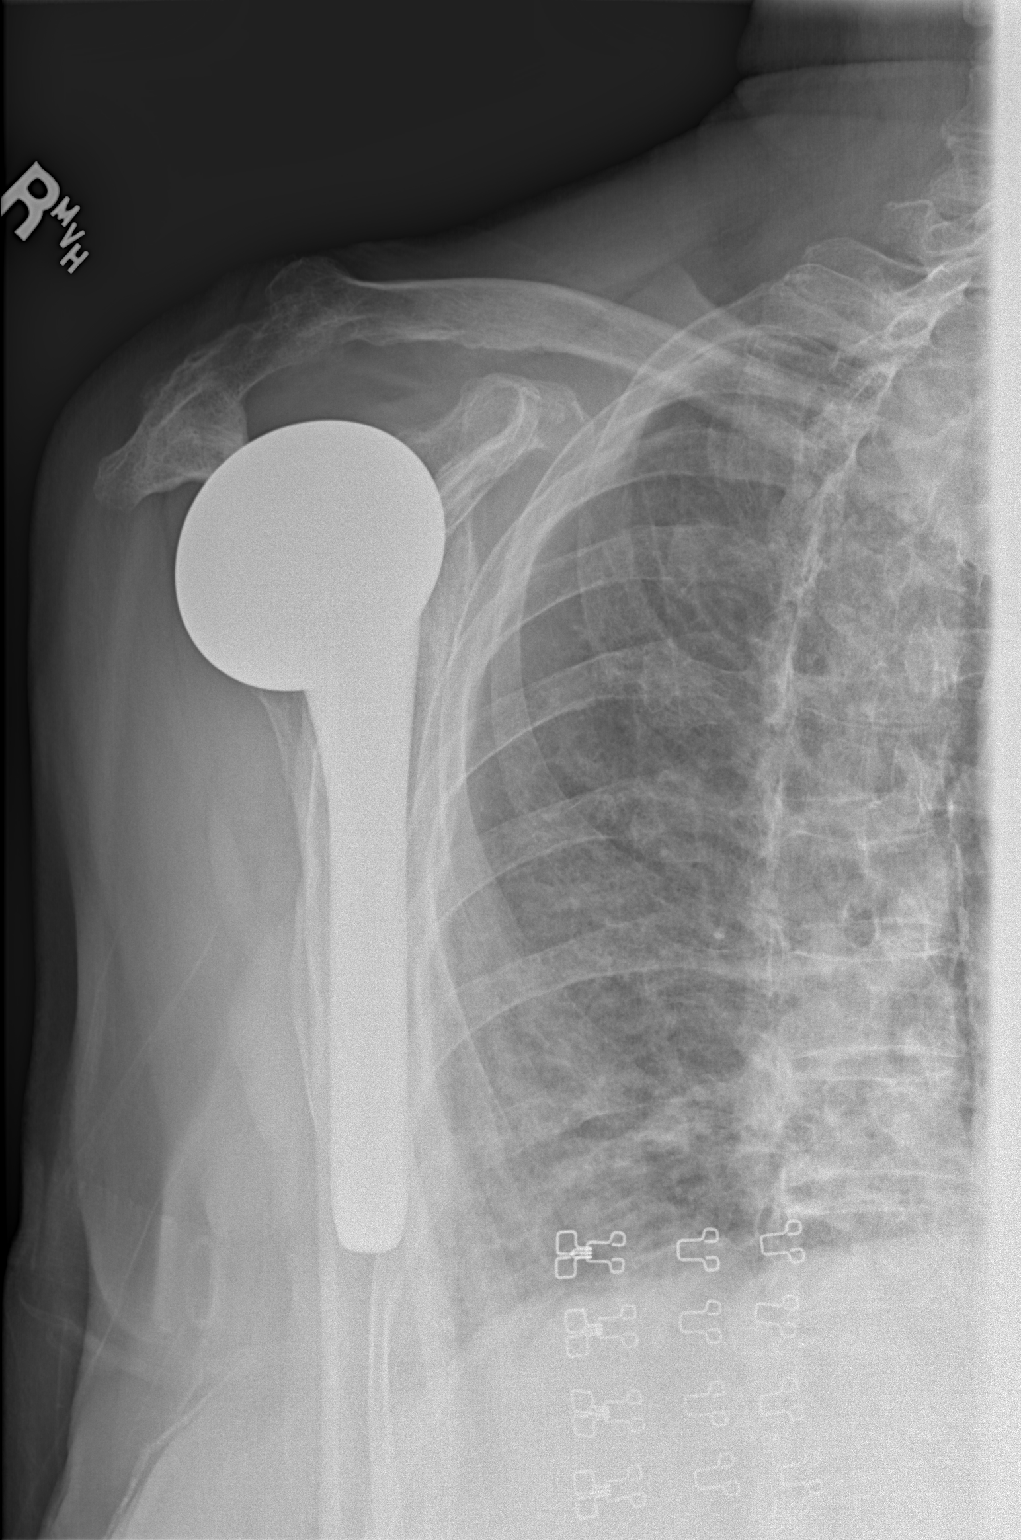

[x shoulder axillary right]
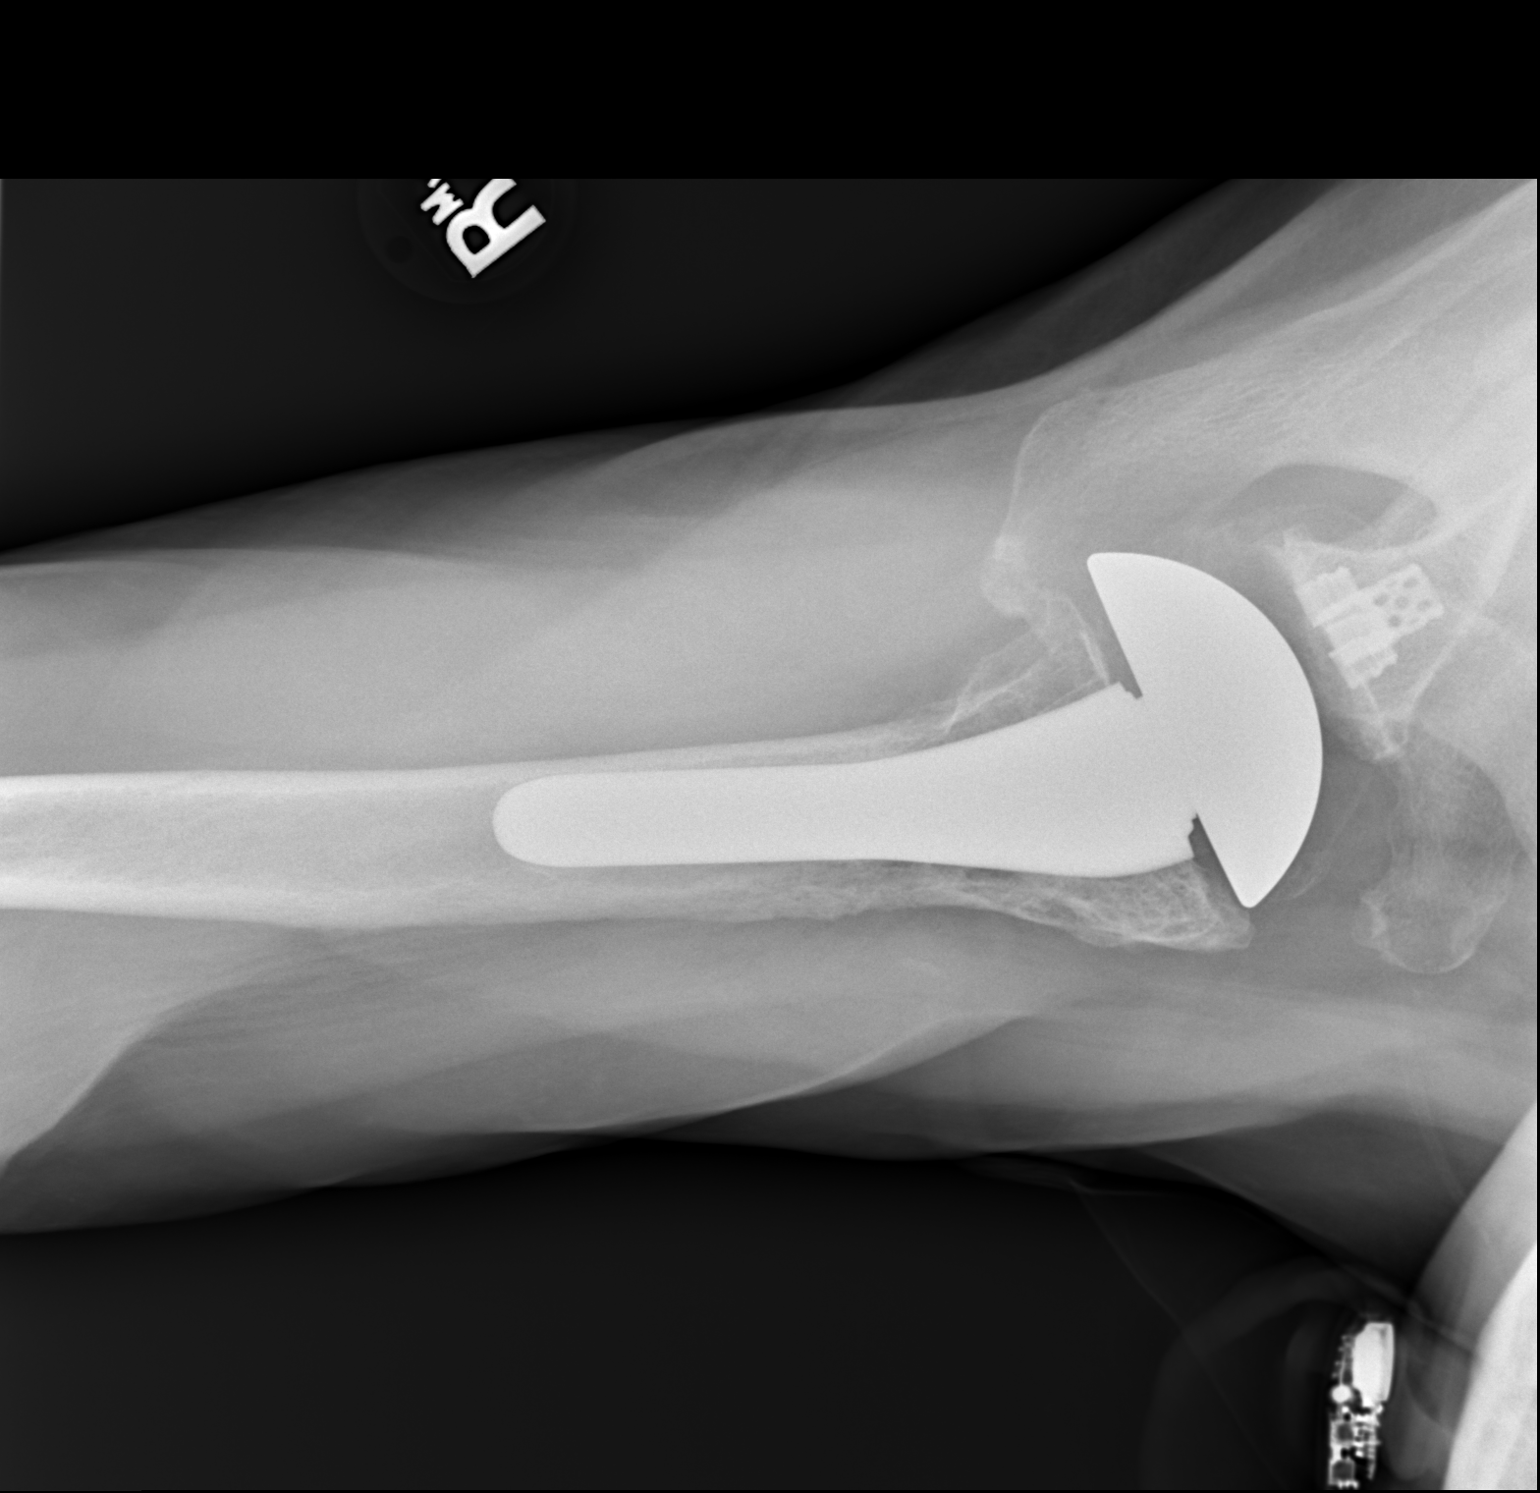

[3 of 3 positions shown; findings below may reference images not displayed]

FINDINGS: Post total right shoulder replacement.

No acute fracture or dislocation.

Chronic lung changes/ pulmonary vascular congestion.
IMPRESSION: Post total right shoulder replacement.

No acute fracture or dislocation.

## 2018-11-15 DIAGNOSIS — E7849 Other hyperlipidemia: Secondary | ICD-10-CM | POA: Diagnosis not present

## 2018-11-15 DIAGNOSIS — E1129 Type 2 diabetes mellitus with other diabetic kidney complication: Secondary | ICD-10-CM | POA: Diagnosis not present

## 2018-11-22 DIAGNOSIS — M1990 Unspecified osteoarthritis, unspecified site: Secondary | ICD-10-CM | POA: Diagnosis not present

## 2018-11-22 DIAGNOSIS — R911 Solitary pulmonary nodule: Secondary | ICD-10-CM | POA: Diagnosis not present

## 2018-11-22 DIAGNOSIS — E1129 Type 2 diabetes mellitus with other diabetic kidney complication: Secondary | ICD-10-CM | POA: Diagnosis not present

## 2018-11-22 DIAGNOSIS — E213 Hyperparathyroidism, unspecified: Secondary | ICD-10-CM | POA: Diagnosis not present

## 2018-11-22 DIAGNOSIS — N183 Chronic kidney disease, stage 3 unspecified: Secondary | ICD-10-CM | POA: Diagnosis not present

## 2018-11-22 DIAGNOSIS — Z0000 Encounter for general adult medical examination without abnormal findings: Secondary | ICD-10-CM | POA: Diagnosis not present

## 2018-11-22 DIAGNOSIS — K529 Noninfective gastroenteritis and colitis, unspecified: Secondary | ICD-10-CM | POA: Diagnosis not present

## 2018-11-22 DIAGNOSIS — E785 Hyperlipidemia, unspecified: Secondary | ICD-10-CM | POA: Diagnosis not present

## 2018-11-22 DIAGNOSIS — I10 Essential (primary) hypertension: Secondary | ICD-10-CM | POA: Diagnosis not present

## 2018-11-22 DIAGNOSIS — I129 Hypertensive chronic kidney disease with stage 1 through stage 4 chronic kidney disease, or unspecified chronic kidney disease: Secondary | ICD-10-CM | POA: Diagnosis not present

## 2018-11-22 DIAGNOSIS — R269 Unspecified abnormalities of gait and mobility: Secondary | ICD-10-CM | POA: Diagnosis not present

## 2018-12-21 DIAGNOSIS — R030 Elevated blood-pressure reading, without diagnosis of hypertension: Secondary | ICD-10-CM | POA: Diagnosis not present

## 2018-12-22 DIAGNOSIS — R030 Elevated blood-pressure reading, without diagnosis of hypertension: Secondary | ICD-10-CM | POA: Diagnosis not present

## 2019-01-27 ENCOUNTER — Encounter (HOSPITAL_COMMUNITY): Payer: Self-pay | Admitting: Emergency Medicine

## 2019-01-27 ENCOUNTER — Inpatient Hospital Stay (HOSPITAL_COMMUNITY)
Admission: EM | Admit: 2019-01-27 | Discharge: 2019-01-29 | DRG: 292 | Disposition: A | Discharge status: Home / Self Care | Payer: Medicare Other | Attending: Osteopathic Medicine | Admitting: Osteopathic Medicine

## 2019-01-27 ENCOUNTER — Other Ambulatory Visit: Payer: Self-pay

## 2019-01-27 ENCOUNTER — Emergency Department (HOSPITAL_COMMUNITY): Payer: Medicare Other

## 2019-01-27 DIAGNOSIS — Z833 Family history of diabetes mellitus: Secondary | ICD-10-CM

## 2019-01-27 DIAGNOSIS — E119 Type 2 diabetes mellitus without complications: Secondary | ICD-10-CM

## 2019-01-27 DIAGNOSIS — Z87891 Personal history of nicotine dependence: Secondary | ICD-10-CM

## 2019-01-27 DIAGNOSIS — Z90710 Acquired absence of both cervix and uterus: Secondary | ICD-10-CM

## 2019-01-27 DIAGNOSIS — I4430 Unspecified atrioventricular block: Secondary | ICD-10-CM | POA: Diagnosis present

## 2019-01-27 DIAGNOSIS — M109 Gout, unspecified: Secondary | ICD-10-CM | POA: Diagnosis present

## 2019-01-27 DIAGNOSIS — R7989 Other specified abnormal findings of blood chemistry: Secondary | ICD-10-CM | POA: Diagnosis not present

## 2019-01-27 DIAGNOSIS — E1165 Type 2 diabetes mellitus with hyperglycemia: Secondary | ICD-10-CM | POA: Diagnosis present

## 2019-01-27 DIAGNOSIS — Z79891 Long term (current) use of opiate analgesic: Secondary | ICD-10-CM

## 2019-01-27 DIAGNOSIS — Z9841 Cataract extraction status, right eye: Secondary | ICD-10-CM

## 2019-01-27 DIAGNOSIS — M1990 Unspecified osteoarthritis, unspecified site: Secondary | ICD-10-CM | POA: Diagnosis present

## 2019-01-27 DIAGNOSIS — Z9049 Acquired absence of other specified parts of digestive tract: Secondary | ICD-10-CM

## 2019-01-27 DIAGNOSIS — I700 Atherosclerosis of aorta: Secondary | ICD-10-CM | POA: Diagnosis present

## 2019-01-27 DIAGNOSIS — R0902 Hypoxemia: Secondary | ICD-10-CM

## 2019-01-27 DIAGNOSIS — Z7952 Long term (current) use of systemic steroids: Secondary | ICD-10-CM

## 2019-01-27 DIAGNOSIS — Z885 Allergy status to narcotic agent status: Secondary | ICD-10-CM

## 2019-01-27 DIAGNOSIS — Z8249 Family history of ischemic heart disease and other diseases of the circulatory system: Secondary | ICD-10-CM

## 2019-01-27 DIAGNOSIS — I4892 Unspecified atrial flutter: Secondary | ICD-10-CM | POA: Diagnosis present

## 2019-01-27 DIAGNOSIS — Z794 Long term (current) use of insulin: Secondary | ICD-10-CM

## 2019-01-27 DIAGNOSIS — Z20822 Contact with and (suspected) exposure to covid-19: Secondary | ICD-10-CM | POA: Diagnosis present

## 2019-01-27 DIAGNOSIS — I5033 Acute on chronic diastolic (congestive) heart failure: Secondary | ICD-10-CM | POA: Diagnosis present

## 2019-01-27 DIAGNOSIS — Z882 Allergy status to sulfonamides status: Secondary | ICD-10-CM

## 2019-01-27 DIAGNOSIS — Z7984 Long term (current) use of oral hypoglycemic drugs: Secondary | ICD-10-CM

## 2019-01-27 DIAGNOSIS — Z79899 Other long term (current) drug therapy: Secondary | ICD-10-CM

## 2019-01-27 DIAGNOSIS — R609 Edema, unspecified: Secondary | ICD-10-CM

## 2019-01-27 DIAGNOSIS — E785 Hyperlipidemia, unspecified: Secondary | ICD-10-CM | POA: Diagnosis present

## 2019-01-27 DIAGNOSIS — I5043 Acute on chronic combined systolic (congestive) and diastolic (congestive) heart failure: Secondary | ICD-10-CM | POA: Diagnosis present

## 2019-01-27 DIAGNOSIS — E118 Type 2 diabetes mellitus with unspecified complications: Secondary | ICD-10-CM

## 2019-01-27 DIAGNOSIS — Z961 Presence of intraocular lens: Secondary | ICD-10-CM | POA: Diagnosis present

## 2019-01-27 DIAGNOSIS — Z96619 Presence of unspecified artificial shoulder joint: Secondary | ICD-10-CM | POA: Diagnosis present

## 2019-01-27 DIAGNOSIS — E1169 Type 2 diabetes mellitus with other specified complication: Secondary | ICD-10-CM | POA: Diagnosis present

## 2019-01-27 DIAGNOSIS — Z7982 Long term (current) use of aspirin: Secondary | ICD-10-CM

## 2019-01-27 DIAGNOSIS — Z9842 Cataract extraction status, left eye: Secondary | ICD-10-CM

## 2019-01-27 DIAGNOSIS — I110 Hypertensive heart disease with heart failure: Secondary | ICD-10-CM | POA: Diagnosis not present

## 2019-01-27 DIAGNOSIS — I480 Paroxysmal atrial fibrillation: Secondary | ICD-10-CM

## 2019-01-27 DIAGNOSIS — I10 Essential (primary) hypertension: Secondary | ICD-10-CM | POA: Diagnosis present

## 2019-01-27 DIAGNOSIS — I340 Nonrheumatic mitral (valve) insufficiency: Secondary | ICD-10-CM | POA: Diagnosis present

## 2019-01-27 DIAGNOSIS — I5032 Chronic diastolic (congestive) heart failure: Secondary | ICD-10-CM | POA: Diagnosis present

## 2019-01-27 LAB — TROPONIN I (HIGH SENSITIVITY)
Troponin I (High Sensitivity): 9 ng/L (ref <18)
Troponin I (High Sensitivity): 10 ng/L (ref <18)

## 2019-01-27 LAB — CBG MONITORING, ED: Glucose-Capillary: 173 mg/dL — ABNORMAL HIGH (ref 70–99)

## 2019-01-27 LAB — HEPATIC FUNCTION PANEL
ALT: 25 U/L (ref 0–44)
AST: 30 U/L (ref 15–41)
Albumin: 3.9 g/dL (ref 3.5–5.0)
Alkaline Phosphatase: 48 U/L (ref 38–126)
Bilirubin, Direct: 0.3 mg/dL — ABNORMAL HIGH (ref 0.0–0.2)
Indirect Bilirubin: 0.2 mg/dL — ABNORMAL LOW (ref 0.3–0.9)
Total Bilirubin: 0.5 mg/dL (ref 0.3–1.2)
Total Protein: 7.3 g/dL (ref 6.5–8.1)

## 2019-01-27 LAB — CBC
HCT: 35.9 % — ABNORMAL LOW (ref 36.0–46.0)
Hemoglobin: 12.2 g/dL (ref 12.0–15.0)
MCH: 33.1 pg (ref 26.0–34.0)
MCHC: 34 g/dL (ref 30.0–36.0)
MCV: 97.3 fL (ref 80.0–100.0)
Platelets: 226 10*3/uL (ref 150–400)
RBC: 3.69 MIL/uL — ABNORMAL LOW (ref 3.87–5.11)
RDW: 12.2 % (ref 11.5–15.5)
WBC: 8.5 10*3/uL (ref 4.0–10.5)
nRBC: 0 % (ref 0.0–0.2)

## 2019-01-27 LAB — URINALYSIS, ROUTINE W REFLEX MICROSCOPIC
Bilirubin Urine: NEGATIVE
Glucose, UA: NEGATIVE mg/dL
Hgb urine dipstick: NEGATIVE
Ketones, ur: NEGATIVE mg/dL
Leukocytes,Ua: NEGATIVE
Nitrite: NEGATIVE
Protein, ur: NEGATIVE mg/dL
Specific Gravity, Urine: 1.004 — ABNORMAL LOW (ref 1.005–1.030)
pH: 5 (ref 5.0–8.0)

## 2019-01-27 LAB — POC SARS CORONAVIRUS 2 AG -  ED: SARS Coronavirus 2 Ag: NEGATIVE

## 2019-01-27 LAB — BASIC METABOLIC PANEL
Anion gap: 14 (ref 5–15)
BUN: 20 mg/dL (ref 8–23)
CO2: 19 mmol/L — ABNORMAL LOW (ref 22–32)
Calcium: 9.4 mg/dL (ref 8.9–10.3)
Chloride: 108 mmol/L (ref 98–111)
Creatinine, Ser: 0.99 mg/dL (ref 0.44–1.00)
GFR calc Af Amer: 58 mL/min — ABNORMAL LOW (ref >60)
GFR calc non Af Amer: 50 mL/min — ABNORMAL LOW (ref >60)
Glucose, Bld: 115 mg/dL — ABNORMAL HIGH (ref 70–99)
Potassium: 4.2 mmol/L (ref 3.5–5.1)
Sodium: 141 mmol/L (ref 135–145)

## 2019-01-27 LAB — D-DIMER, QUANTITATIVE: D-Dimer, Quant: 1.47 ug/mL-FEU — ABNORMAL HIGH (ref 0.00–0.50)

## 2019-01-27 LAB — BRAIN NATRIURETIC PEPTIDE: B Natriuretic Peptide: 194.9 pg/mL — ABNORMAL HIGH (ref 0.0–100.0)

## 2019-01-27 MED ORDER — IOHEXOL 350 MG/ML SOLN
75.0000 mL | Freq: Once | INTRAVENOUS | Status: AC | PRN
  Administered 2019-01-27: 75 mL via INTRAVENOUS

## 2019-01-27 MED ORDER — SODIUM CHLORIDE 0.9% FLUSH
3.0000 mL | Freq: Once | INTRAVENOUS | Status: AC
  Administered 2019-01-27: 3 mL via INTRAVENOUS

## 2019-01-27 NOTE — ED Provider Notes (Signed)
[facility] Provider Note   CSN: [id number] Arrival date & time: 01/27/19  1425     History Chief Complaint  Patient presents with  . Hyperglycemia  . Cough  . Weakness    Allison Duffy is a 85 y.o. female.  HPI    85 year old female with a history of hyperlipidemia, hypertension, diabetes, who presents emergency department today for evaluation of cough, shortness of breath, generalized weakness and hyperglycemia.  Patient states for the last several days she has had congestion, postnasal drip and a cough productive of sputum.  She has also been feeling short of breath for the last few days.  She denies any chest pain or pain with inspiration, but she does describe a pressure in her chest.  She does have intermittent bilateral lower extremity swelling but has not noticed any changes today.  She denies any fevers or sore throat.  She has not had any known COVID exposures however, she was recently around family for Christmas from 12/23-12/26. States that she was around a Dietitian but that this person works in neurology and is not typically around [place] patients.    Past Medical History:  Diagnosis Date  . Arthritis    "scattered joints" (07/29/2013)  . Chest pain   . Colitis   . Gout attack ?1980's  . Hyperlipidemia   . Hypertension   . PONV (postoperative nausea and vomiting)   . Type II diabetes mellitus [facility])     Patient Active Problem List   Diagnosis Date Noted  . Colitis, acute 05/31/2016  . Hematochezia 05/31/2016  . Primary osteoarthritis of right shoulder 12/27/2014  . Pulmonary nodule 06/23/2014  . Viral gastroenteritis 06/23/2014  . Viral gastritis 06/23/2014  . Septic olecranon bursitis 07/29/2013  . Essential hypertension 07/05/2013  . Hyperlipidemia 07/05/2013  . Type 2 diabetes mellitus ([place]) 07/05/2013  . Chest pain 07/05/2013    Past Surgical History:  Procedure Laterality Date  . APPENDECTOMY    .  CARPAL TUNNEL RELEASE Right   . CATARACT EXTRACTION W/ INTRAOCULAR LENS  IMPLANT, BILATERAL Bilateral   . CHOLECYSTECTOMY    . KNEE ARTHROSCOPY Right   . OLECRANON BURSECTOMY Left 07/29/2013   "in ER"  . PARATHYROIDECTOMY    . TONSILLECTOMY    . TOTAL SHOULDER REPLACEMENT  2017  . VAGINAL HYSTERECTOMY       OB History   No obstetric history on file.     Family History  Problem Relation Age of Onset  . Hypertension Father   . Heart disease Father   . Heart disease Mother   . Diabetes Mother   . Hypertension Mother   . Heart disease Brother        both brothers  . Diabetes Brother   . Hypertension Brother     Social History   Tobacco Use  . Smoking status: Former Smoker    Years: 5.00    Types: Cigarettes    Quit date: 07/05/1953    Years since quitting: 65.6  . Smokeless tobacco: Never Used  Substance Use Topics  . Alcohol use: No  . Drug use: No    Home Medications Prior to Admission medications   Medication Sig Start Date End Date Taking? Authorizing Provider  acetaminophen (TYLENOL) 500 MG tablet Take 1,000 mg by mouth every 6 (six) hours as needed.    [provider]  aluminum-magnesium hydroxide-simethicone (MAALOX) I037812 MG/5ML SUSP Take 30 mLs by mouth 4 (four) times daily -  before meals and at bedtime.    [provider]  amLODipine (NORVASC) 10 MG tablet Take 10 mg by mouth daily.    [provider]  ascorbic acid (VITAMIN C) 500 MG tablet Take 500 mg by mouth 3 (three) times daily.    [provider]  aspirin 81 MG tablet Take 81 mg by mouth 2 (two) times daily.     [provider]  Calcium Carbonate (CALCIUM 600 PO) Take 1,200 mg by mouth daily.    [provider]  cholecalciferol (VITAMIN D) 1000 UNITS tablet Take 1,000 Units by mouth daily.    [provider]  cloNIDine (CATAPRES) 0.1 MG tablet Take 0.1 mg by mouth 2 (two) times daily.    [provider]  furosemide (LASIX) 40  MG tablet Take 40 mg by mouth daily.    [provider]  gabapentin (NEURONTIN) 100 MG capsule Take 1 capsule (100 mg total) by mouth 3 (three) times daily. 01/26/18   Volanda Napoleon, PA-C  Glucosamine HCl (GLUCOSAMINE PO) Take 2,000 mg by mouth daily.    [provider]  insulin aspart (NOVOLOG) 100 UNIT/ML injection Inject 6 Units into the skin 3 (three) times daily with meals. 06/06/16   Johnson, Clanford L, MD  insulin glargine (LANTUS) 100 UNIT/ML injection Inject 0.15 mLs (15 Units total) into the skin every morning. 06/07/16   Johnson, Clanford L, MD  lisinopril (PRINIVIL,ZESTRIL) 40 MG tablet Take 40 mg by mouth daily.    [provider]  magnesium oxide (MAG-OX) 400 MG tablet Take 400 mg by mouth daily.    [provider]  metFORMIN (GLUCOPHAGE) 500 MG tablet Take 500 mg by mouth daily with breakfast.     [provider]  metoprolol succinate (TOPROL-XL) 50 MG 24 hr tablet Take 50 mg by mouth 2 (two) times daily. 05/05/16   [provider]  Multiple Vitamin (MULTIVITAMIN WITH MINERALS) TABS Take 1 tablet by mouth 2 (two) times daily. Vitality Multivitamin & mineral    [provider]  omeprazole (PRILOSEC) 40 MG capsule Take 1 capsule (40 mg total) by mouth daily. 06/06/16   Johnson, Clanford L, MD  Potassium 99 MG TABS Take 99 mg by mouth daily.    [provider]  pravastatin (PRAVACHOL) 20 MG tablet Take 20 mg by mouth daily.    [provider]  predniSONE (STERAPRED UNI-PAK 21 TAB) 10 MG (21) TBPK tablet Take by mouth daily. Take 6 tabs by mouth daily  for 2 days, then 5 tabs for 2 days, then 4 tabs for 2 days, then 3 tabs for 2 days, 2 tabs for 2 days, then 1 tab by mouth daily for 2 days 01/26/18   [place] Lanius A, PA-C  sitaGLIPtin (JANUVIA) 100 MG tablet Take 100 mg by mouth daily.    [provider]  traMADol (ULTRAM) 50 MG tablet Take 1 tablet (50 mg total) by mouth every 6 (six) hours as  needed. 01/26/18   Volanda Napoleon, PA-C  vitamin E 400 UNIT capsule Take 400 Units by mouth daily.    [provider]    Allergies    Codeine, Hydrocodone, and Sulfa antibiotics  Review of Systems   Review of Systems  Constitutional: Negative for chills and fever.  HENT: Positive for congestion and postnasal drip. Negative for ear pain and sore throat.   Eyes: Negative for visual disturbance.  Respiratory: Positive for cough and shortness of breath.   Cardiovascular: Positive for  chest pain (pressure) and leg swelling.  Gastrointestinal: Negative for abdominal pain, constipation, diarrhea, nausea and vomiting.  Genitourinary: Negative for dysuria and hematuria.  Musculoskeletal: Negative for back pain.  Skin: Negative for rash.  Neurological: Positive for weakness (generalized). Negative for headaches.  All other systems reviewed and are negative.   Physical Exam Updated Vital Signs BP (!) 178/77   Pulse 66   Temp 98.4 F (36.9 C) (Oral)   Resp 13   SpO2 95%   Physical Exam Vitals and nursing note reviewed.  Constitutional:      General: She is not in acute distress.    Appearance: She is well-developed. She is not ill-appearing.     Comments: Appears younger than stated age  HENT:     Head: Normocephalic and atraumatic.  Eyes:     Conjunctiva/sclera: Conjunctivae normal.  Cardiovascular:     Rate and Rhythm: Normal rate and regular rhythm.     Heart sounds: Normal heart sounds. No murmur.  Pulmonary:     Effort: No respiratory distress.     Breath sounds: Rales (faint rll rales) present. No wheezing or rhonchi.     Comments: Pt becomes very tachypneic with conversation Abdominal:     General: Bowel sounds are normal.     Palpations: Abdomen is soft.     Tenderness: There is no abdominal tenderness. There is no guarding or rebound.  Musculoskeletal:        General: No tenderness.     Cervical back: Neck supple.     Right lower leg: Edema present.      Left lower leg: Edema present.  Skin:    General: Skin is warm and dry.  Neurological:     Mental Status: She is alert.     ED Results / Procedures / Treatments   Labs (all labs ordered are listed, but only abnormal results are displayed) Labs Reviewed  BASIC METABOLIC PANEL - Abnormal; Notable for the following components:      Result Value   CO2 19 (*)    Glucose, Bld 115 (*)    GFR calc non Af Amer 50 (*)    GFR calc Af Amer 58 (*)    All other components within normal limits  CBC - Abnormal; Notable for the following components:   RBC 3.69 (*)    HCT 35.9 (*)    All other components within normal limits  URINALYSIS, ROUTINE W REFLEX MICROSCOPIC - Abnormal; Notable for the following components:   Color, Urine STRAW (*)    Specific Gravity, Urine 1.004 (*)    All other components within normal limits  HEPATIC FUNCTION PANEL - Abnormal; Notable for the following components:   Bilirubin, Direct 0.3 (*)    Indirect Bilirubin 0.2 (*)    All other components within normal limits  BRAIN NATRIURETIC PEPTIDE - Abnormal; Notable for the following components:   B Natriuretic Peptide 194.9 (*)    All other components within normal limits  D-DIMER, QUANTITATIVE (NOT AT [facility]) - Abnormal; Notable for the following components:   D-Dimer, Quant 1.47 (*)    All other components within normal limits  CBG MONITORING, ED - Abnormal; Notable for the following components:   Glucose-Capillary 173 (*)    All other components within normal limits  SARS CORONAVIRUS 2 (TAT 6-24 HRS)  POC SARS CORONAVIRUS 2 AG -  ED  TROPONIN I (HIGH SENSITIVITY)  TROPONIN I (HIGH SENSITIVITY)    EKG EKG Interpretation  Date/Time:  Thursday January 27 2019 18:56:46 EST Ventricular Rate:  67 PR Interval:    QRS Duration: 121 QT Interval:  457 QTC Calculation: 483 R Axis:   70 Text Interpretation: Atrial flutter with predominant 4:1 AV block Nonspecific intraventricular conduction delay When compared to  prior, new aflutter. No STEMI Confirmed by Antony Blackbird [id number]) on 01/27/2019 7:20:00 PM   Radiology DG Chest Portable 1 View  Result Date: 01/27/2019 CLINICAL DATA:  Shortness of breath. EXAM: PORTABLE CHEST 1 VIEW COMPARISON:  Jun 05, 2016. FINDINGS: Stable cardiomediastinal silhouette. Atherosclerosis of thoracic aorta is noted. No pneumothorax is noted. Minimal bibasilar subsegmental atelectasis or scarring is noted. Status post right shoulder arthroplasty. IMPRESSION: Aortic atherosclerosis. Minimal bibasilar subsegmental atelectasis or scarring. Electronically Signed   By: Marijo Conception M.D.   On: 01/27/2019 19:00    ECHOCARDIOGRAM Name: [facility], Allison Duffy  Study Date: 12/18/2014  Height: 66 in MRN: [id number]  Patient Location: A855  Weight: 188 lb DOB: 03/09/1926  Gender: Female  BSA: 1.9 m2 Age: 24 yrsEthnicity: Caucasian  BP: 138/77 mmHg Reason For Study: Hypoxia  Ordering Physician: [id number] Macky Lower Performed By: Alphonzo Cruise Referring Physician: Lara Mulch DAVIS - -  PROCEDURE Study Quality: Fair. - SUMMARY Basal left ventricular septal hypertrophy No segmental wall motion abnormalities seen in the left ventricle The right ventricle is mildly dilated.  Right ventricular function cannot be assessed due to poor image quality. The left atrium is moderately dilated. The right atrium is mildly dilated. Focal thickening of the aortic valve with preserved cusp opening. There is mild mitral regurgitation. There is mild to moderate tricuspid regurgitation. Moderate to severe pulmonary hypertension. Trace pulmonic valvular regurgitation. There is no pericardial effusion. There is no comparison study available.  Procedures Procedures (including critical care time)  Medications Ordered in ED Medications  sodium chloride flush (NS) 0.9 % injection 3 mL (3 mLs Intravenous Given 01/27/19 2038)    ED Course  I have reviewed the triage vital signs and  the nursing notes.  Pertinent labs & imaging results that were available during my care of the patient were reviewed by me and considered in my medical decision making (see chart for details).    MDM Rules/Calculators/A&P                      85 y/o F presenting with cough, SOB, hyperglycemia.   On exam, she has faint rales and appears to be fluid overloaded.  CBC nonacute BMP with low CO2, otherwise reassuring Liver enzymes nonacute Trop negative.  BNP elevated at 192 Ddimer elevated at 1.47  - will proceed with CTA chest to r/o PE POC COVID testing is negative.  Send out COVID obtained and pending UA w/o signs of infection  EKG with atrial flutter with predominant 4:1 AV block Nonspecific intraventricular conduction delay When compared to prior, new aflutter. No STEMI  - Pt has no known h/o this  - CHADS2VASc score = 4 (CHF (reported), HTN, age, DM)  CXR with aortic atherosclerosis. Minimal bibasilar subsegmental atelectasis or scarring.  Tachypneic with conversation. Ambulated with pulse ox and dropped to 87% on RA.   At shift change, care transitioned to Dr. Sedonia Small pending PE study. If negative, would tx for heart failure exacerbation and admit.   Final Clinical Impression(s) / ED Diagnoses Final diagnoses:  None    Rx / DC Orders ED Discharge Orders    None       Rodney Booze, PA-C 01/27/19 2053  Tegeler, Gwenyth Allegra, MD 01/27/19 2115

## 2019-01-27 NOTE — ED Triage Notes (Signed)
Pt. Stated, [name] had my sugar to be up all week. It was in the 300's and one time it got down to 137 , all other times its been in the 200's and 300's. I just don't feel good. I have some nasal drainage and as cough. I just don't feel good. Ive tried to get some stuff done around the house and I just give out.

## 2019-01-27 NOTE — ED Notes (Signed)
Patient transported to CT 

## 2019-01-27 NOTE — ED Provider Notes (Signed)
Patient care signed out by Dr. Sedonia Small at end of shift. Patient with ss/sxs of new CHF vs PE vs PNA vs COVID with DOE and hypoxia on arrival.   Pending CTA, anticipate admission  CTA negative for PE. There are small bilateral effusions; possible pulmonary edema with diffuse hazy attenuation of lung fields; mediastinal adenopathy.   Will admit to hospitalist per plan of previous treatment team, for new hypoxia, likely CHF with peripheral edema.    Charlann Lange, PA-C 01/28/19 0604    Maudie Flakes, MD 01/28/19 [phone]

## 2019-01-27 NOTE — ED Notes (Signed)
Pt ambulated with 1 assist, very SOB, dropped to 87% but came back up to mid 90's.

## 2019-01-28 ENCOUNTER — Encounter (HOSPITAL_COMMUNITY): Payer: Self-pay | Admitting: Internal Medicine

## 2019-01-28 ENCOUNTER — Inpatient Hospital Stay (HOSPITAL_COMMUNITY): Payer: Medicare Other

## 2019-01-28 DIAGNOSIS — Z7982 Long term (current) use of aspirin: Secondary | ICD-10-CM | POA: Diagnosis not present

## 2019-01-28 DIAGNOSIS — Z7952 Long term (current) use of systemic steroids: Secondary | ICD-10-CM | POA: Diagnosis not present

## 2019-01-28 DIAGNOSIS — Z9049 Acquired absence of other specified parts of digestive tract: Secondary | ICD-10-CM | POA: Diagnosis not present

## 2019-01-28 DIAGNOSIS — Z87891 Personal history of nicotine dependence: Secondary | ICD-10-CM | POA: Diagnosis not present

## 2019-01-28 DIAGNOSIS — I340 Nonrheumatic mitral (valve) insufficiency: Secondary | ICD-10-CM | POA: Diagnosis not present

## 2019-01-28 DIAGNOSIS — Z79899 Other long term (current) drug therapy: Secondary | ICD-10-CM | POA: Diagnosis not present

## 2019-01-28 DIAGNOSIS — I361 Nonrheumatic tricuspid (valve) insufficiency: Secondary | ICD-10-CM | POA: Diagnosis not present

## 2019-01-28 DIAGNOSIS — Z7984 Long term (current) use of oral hypoglycemic drugs: Secondary | ICD-10-CM | POA: Diagnosis not present

## 2019-01-28 DIAGNOSIS — I5032 Chronic diastolic (congestive) heart failure: Secondary | ICD-10-CM | POA: Diagnosis present

## 2019-01-28 DIAGNOSIS — Z90710 Acquired absence of both cervix and uterus: Secondary | ICD-10-CM | POA: Diagnosis not present

## 2019-01-28 DIAGNOSIS — Z79891 Long term (current) use of opiate analgesic: Secondary | ICD-10-CM | POA: Diagnosis not present

## 2019-01-28 DIAGNOSIS — I110 Hypertensive heart disease with heart failure: Secondary | ICD-10-CM | POA: Diagnosis not present

## 2019-01-28 DIAGNOSIS — Z8249 Family history of ischemic heart disease and other diseases of the circulatory system: Secondary | ICD-10-CM | POA: Diagnosis not present

## 2019-01-28 DIAGNOSIS — Z833 Family history of diabetes mellitus: Secondary | ICD-10-CM | POA: Diagnosis not present

## 2019-01-28 DIAGNOSIS — M1990 Unspecified osteoarthritis, unspecified site: Secondary | ICD-10-CM | POA: Diagnosis not present

## 2019-01-28 DIAGNOSIS — I4892 Unspecified atrial flutter: Secondary | ICD-10-CM | POA: Diagnosis not present

## 2019-01-28 DIAGNOSIS — Z794 Long term (current) use of insulin: Secondary | ICD-10-CM | POA: Diagnosis not present

## 2019-01-28 DIAGNOSIS — I5043 Acute on chronic combined systolic (congestive) and diastolic (congestive) heart failure: Secondary | ICD-10-CM | POA: Diagnosis not present

## 2019-01-28 DIAGNOSIS — M109 Gout, unspecified: Secondary | ICD-10-CM | POA: Diagnosis not present

## 2019-01-28 DIAGNOSIS — Z885 Allergy status to narcotic agent status: Secondary | ICD-10-CM | POA: Diagnosis not present

## 2019-01-28 DIAGNOSIS — Z9842 Cataract extraction status, left eye: Secondary | ICD-10-CM | POA: Diagnosis not present

## 2019-01-28 DIAGNOSIS — E785 Hyperlipidemia, unspecified: Secondary | ICD-10-CM | POA: Diagnosis not present

## 2019-01-28 DIAGNOSIS — Z961 Presence of intraocular lens: Secondary | ICD-10-CM | POA: Diagnosis not present

## 2019-01-28 DIAGNOSIS — Z9841 Cataract extraction status, right eye: Secondary | ICD-10-CM | POA: Diagnosis not present

## 2019-01-28 DIAGNOSIS — R609 Edema, unspecified: Secondary | ICD-10-CM | POA: Diagnosis present

## 2019-01-28 DIAGNOSIS — I480 Paroxysmal atrial fibrillation: Secondary | ICD-10-CM

## 2019-01-28 DIAGNOSIS — E1165 Type 2 diabetes mellitus with hyperglycemia: Secondary | ICD-10-CM | POA: Diagnosis not present

## 2019-01-28 DIAGNOSIS — I5033 Acute on chronic diastolic (congestive) heart failure: Secondary | ICD-10-CM | POA: Diagnosis present

## 2019-01-28 DIAGNOSIS — Z20822 Contact with and (suspected) exposure to covid-19: Secondary | ICD-10-CM | POA: Diagnosis not present

## 2019-01-28 DIAGNOSIS — Z882 Allergy status to sulfonamides status: Secondary | ICD-10-CM | POA: Diagnosis not present

## 2019-01-28 HISTORY — DX: Acute on chronic combined systolic (congestive) and diastolic (congestive) heart failure: I50.43

## 2019-01-28 LAB — GLUCOSE, CAPILLARY
Glucose-Capillary: 137 mg/dL — ABNORMAL HIGH (ref 70–99)
Glucose-Capillary: 146 mg/dL — ABNORMAL HIGH (ref 70–99)
Glucose-Capillary: 192 mg/dL — ABNORMAL HIGH (ref 70–99)
Glucose-Capillary: 184 mg/dL — ABNORMAL HIGH (ref 70–99)
Glucose-Capillary: 244 mg/dL — ABNORMAL HIGH (ref 70–99)

## 2019-01-28 LAB — CBC
HCT: 35 % — ABNORMAL LOW (ref 36.0–46.0)
Hemoglobin: 12.2 g/dL (ref 12.0–15.0)
MCH: 33.6 pg (ref 26.0–34.0)
MCHC: 34.9 g/dL (ref 30.0–36.0)
MCV: 96.4 fL (ref 80.0–100.0)
Platelets: 244 10*3/uL (ref 150–400)
RBC: 3.63 MIL/uL — ABNORMAL LOW (ref 3.87–5.11)
RDW: 12.4 % (ref 11.5–15.5)
WBC: 8.3 10*3/uL (ref 4.0–10.5)
nRBC: 0 % (ref 0.0–0.2)

## 2019-01-28 LAB — CREATININE, SERUM
Creatinine, Ser: 1.09 mg/dL — ABNORMAL HIGH (ref 0.44–1.00)
GFR calc Af Amer: 52 mL/min — ABNORMAL LOW (ref >60)
GFR calc non Af Amer: 45 mL/min — ABNORMAL LOW (ref >60)

## 2019-01-28 LAB — ECHOCARDIOGRAM COMPLETE: Weight: 2843.2 oz

## 2019-01-28 LAB — HEMOGLOBIN A1C
Hgb A1c MFr Bld: 6.6 % — ABNORMAL HIGH (ref 4.8–5.6)
Mean Plasma Glucose: 142.72 mg/dL

## 2019-01-28 LAB — SARS CORONAVIRUS 2 (TAT 6-24 HRS): SARS Coronavirus 2: NEGATIVE

## 2019-01-28 MED ORDER — ACETAMINOPHEN 325 MG PO TABS: 650.0000 mg | ORAL_TABLET | ORAL | Status: DC | PRN

## 2019-01-28 MED ORDER — SODIUM CHLORIDE 0.9% FLUSH
3.0000 mL | Freq: Two times a day (BID) | INTRAVENOUS | Status: DC
  Administered 2019-01-28 – 2019-01-29 (×4): 3 mL via INTRAVENOUS

## 2019-01-28 MED ORDER — SODIUM CHLORIDE 0.9% FLUSH: 3.0000 mL | INTRAVENOUS | Status: DC | PRN

## 2019-01-28 MED ORDER — HEPARIN SODIUM (PORCINE) 5000 UNIT/ML IJ SOLN
5000.0000 [IU] | Freq: Three times a day (TID) | INTRAMUSCULAR | Status: DC
  Administered 2019-01-28 – 2019-01-29 (×4): 5000 [IU] via SUBCUTANEOUS
  Filled 2019-01-28 (×4): qty 1

## 2019-01-28 MED ORDER — FUROSEMIDE 10 MG/ML IJ SOLN
40.0000 mg | Freq: Two times a day (BID) | INTRAMUSCULAR | Status: DC
  Administered 2019-01-28 – 2019-01-29 (×3): 40 mg via INTRAVENOUS
  Filled 2019-01-28 (×3): qty 4

## 2019-01-28 MED ORDER — PRAVASTATIN SODIUM 10 MG PO TABS
20.0000 mg | ORAL_TABLET | Freq: Every day | ORAL | Status: DC
  Administered 2019-01-28 – 2019-01-29 (×2): 20 mg via ORAL
  Filled 2019-01-28 (×2): qty 2

## 2019-01-28 MED ORDER — INSULIN ASPART 100 UNIT/ML ~~LOC~~ SOLN
0.0000 [IU] | Freq: Every day | SUBCUTANEOUS | Status: DC
  Administered 2019-01-28: 2 [IU] via SUBCUTANEOUS

## 2019-01-28 MED ORDER — INSULIN ASPART 100 UNIT/ML ~~LOC~~ SOLN
0.0000 [IU] | Freq: Three times a day (TID) | SUBCUTANEOUS | Status: DC
  Administered 2019-01-28: 3 [IU] via SUBCUTANEOUS
  Administered 2019-01-28: 2 [IU] via SUBCUTANEOUS
  Administered 2019-01-28 – 2019-01-29 (×3): 3 [IU] via SUBCUTANEOUS

## 2019-01-28 MED ORDER — METOPROLOL SUCCINATE ER 50 MG PO TB24
50.0000 mg | ORAL_TABLET | Freq: Two times a day (BID) | ORAL | Status: DC
  Administered 2019-01-28 – 2019-01-29 (×4): 50 mg via ORAL
  Filled 2019-01-28 (×4): qty 1

## 2019-01-28 MED ORDER — SODIUM CHLORIDE 0.9 % IV SOLN: 250.0000 mL | INTRAVENOUS | Status: DC | PRN

## 2019-01-28 MED ORDER — ONDANSETRON HCL 4 MG/2ML IJ SOLN: 4.0000 mg | Freq: Four times a day (QID) | INTRAMUSCULAR | Status: DC | PRN

## 2019-01-28 MED ORDER — LISINOPRIL 40 MG PO TABS
40.0000 mg | ORAL_TABLET | Freq: Every day | ORAL | Status: DC
  Administered 2019-01-28 – 2019-01-29 (×2): 40 mg via ORAL
  Filled 2019-01-28 (×2): qty 1

## 2019-01-28 NOTE — Plan of Care (Signed)
  Problem: Clinical Measurements: Goal: Respiratory complications will improve Outcome: Progressing   Problem: Activity: Goal: Risk for activity intolerance will decrease Outcome: Progressing   Problem: Elimination: Goal: Will not experience complications related to urinary retention Outcome: Progressing   Problem: Safety: Goal: Ability to remain free from injury will improve Outcome: Progressing   

## 2019-01-28 NOTE — ED Notes (Signed)
ED TO INPATIENT HANDOFF REPORT  ED Nurse Name and Phone #: William Hamburger, RN [phone]  S Name/Age/Gender Allison Duffy 86 y.o. female Room/Bed: 042C/042C  Code Status   Code Status: Prior  Home/SNF/Other Home Patient oriented to: self, place, time and situation Is this baseline? No   Triage Complete: Triage complete  Chief Complaint Acute on chronic combined systolic and diastolic CHF (congestive heart failure) ([place]) [I50.43]  Triage Note Pt. Stated, [name] had my sugar to be up all week. It was in the 300's and one time it got down to 137 , all other times its been in the 200's and 300's. I just don't feel good. I have some nasal drainage and as cough. I just don't feel good. Ive tried to get some stuff done around the house and I just give out.    Allergies Allergies  Allergen Reactions  . Codeine Nausea And Vomiting  . Hydrocodone     Nausea and vomiting  . Sulfa Antibiotics Itching and Nausea Only    Level of Care/Admitting Diagnosis ED Disposition    ED Disposition Condition [facility]: [place] [[id number]]  Level of Care: Telemetry Cardiac [[id number]]  Covid Evaluation: Asymptomatic Screening Protocol (No Symptoms)  Diagnosis: Acute on chronic combined systolic and diastolic CHF (congestive heart failure) ([place]) [[id number]]  Admitting Physician: Elwyn Reach [[id number]]  Attending Physician: Elwyn Reach [[id number]]  Estimated length of stay: past midnight tomorrow  Certification:: I certify this patient will need inpatient services for at least 2 midnights       B Medical/Surgery History Past Medical History:  Diagnosis Date  . Acute on chronic combined systolic and diastolic CHF (congestive heart failure) ([place]) 01/28/2019  . Arthritis    "scattered joints" (07/29/2013)  . Chest pain   . Colitis   . Gout attack ?1980's  . Hyperlipidemia   . Hypertension   . PONV (postoperative nausea and vomiting)   . Type II diabetes mellitus  ([place])    Past Surgical History:  Procedure Laterality Date  . APPENDECTOMY    . CARPAL TUNNEL RELEASE Right   . CATARACT EXTRACTION W/ INTRAOCULAR LENS  IMPLANT, BILATERAL Bilateral   . CHOLECYSTECTOMY    . KNEE ARTHROSCOPY Right   . OLECRANON BURSECTOMY Left 07/29/2013   "in ER"  . PARATHYROIDECTOMY    . TONSILLECTOMY    . TOTAL SHOULDER REPLACEMENT  2017  . VAGINAL HYSTERECTOMY       A IV Location/Drains/Wounds Patient Lines/Drains/Airways Status   Active Line/Drains/Airways    Name:   Placement date:   Placement time:   Site:   Days:   Peripheral IV 01/27/19 Wrist   01/27/19    1903    Wrist   1   Peripheral IV 01/27/19 Left Antecubital   01/27/19    2131    Antecubital   1   Incision (Closed) 07/29/13 Elbow Left   07/29/13    --     2009   Pressure Ulcer 05/08/12 Stage II -  Partial thickness loss of dermis presenting as a shallow open ulcer with a red, pink wound bed without slough. clear drainage   05/08/12    --     2456          Intake/Output Last 24 hours No intake or output data in the 24 hours ending 01/28/19 0104  Labs/Imaging Results for orders placed or performed during the hospital encounter of 01/27/19 (from the past  48 hour(s))  Basic metabolic panel     Status: Abnormal   Collection Time: 01/27/19  3:08 PM  Result Value Ref Range   Sodium 141 135 - 145 mmol/L   Potassium 4.2 3.5 - 5.1 mmol/L   Chloride 108 98 - 111 mmol/L   CO2 19 (L) 22 - 32 mmol/L   Glucose, Bld 115 (H) 70 - 99 mg/dL   BUN 20 8 - 23 mg/dL   Creatinine, Ser 0.99 0.44 - 1.00 mg/dL   Calcium 9.4 8.9 - 10.3 mg/dL   GFR calc non Af Amer 50 (L) >60 mL/min   GFR calc Af Amer 58 (L) >60 mL/min   Anion gap 14 5 - 15    Comment: Performed at [facility] [address], [place], [place] [address]  CBC     Status: Abnormal   Collection Time: 01/27/19  3:08 PM  Result Value Ref Range   WBC 8.5 4.0 - 10.5 K/uL   RBC 3.69 (L) 3.87 - 5.11 MIL/uL   Hemoglobin 12.2 12.0 - 15.0 g/dL    HCT 35.9 (L) 36.0 - 46.0 %   MCV 97.3 80.0 - 100.0 fL   MCH 33.1 26.0 - 34.0 pg   MCHC 34.0 30.0 - 36.0 g/dL   RDW 12.2 11.5 - 15.5 %   Platelets 226 150 - 400 K/uL   nRBC 0.0 0.0 - 0.2 %    Comment: Performed at [facility], [place] [address], [place], [place] [address]  Urinalysis, Routine w reflex microscopic     Status: Abnormal   Collection Time: 01/27/19  4:25 PM  Result Value Ref Range   Color, Urine STRAW (A) YELLOW   APPearance CLEAR CLEAR   Specific Gravity, Urine 1.004 (L) 1.005 - 1.030   pH 5.0 5.0 - 8.0   Glucose, UA NEGATIVE NEGATIVE mg/dL   Hgb urine dipstick NEGATIVE NEGATIVE   Bilirubin Urine NEGATIVE NEGATIVE   Ketones, ur NEGATIVE NEGATIVE mg/dL   Protein, ur NEGATIVE NEGATIVE mg/dL   Nitrite NEGATIVE NEGATIVE   Leukocytes,Ua NEGATIVE NEGATIVE    Comment: Performed at [facility] [address] [place], [place] [address]  Hepatic function panel     Status: Abnormal   Collection Time: 01/27/19  7:00 PM  Result Value Ref Range   Total Protein 7.3 6.5 - 8.1 g/dL   Albumin 3.9 3.5 - 5.0 g/dL   AST 30 15 - 41 U/L   ALT 25 0 - 44 U/L   Alkaline Phosphatase 48 38 - 126 U/L   Total Bilirubin 0.5 0.3 - 1.2 mg/dL   Bilirubin, Direct 0.3 (H) 0.0 - 0.2 mg/dL   Indirect Bilirubin 0.2 (L) 0.3 - 0.9 mg/dL    Comment: Performed at [facility] [address] [place], [place] [address]  Troponin I (High Sensitivity)     Status: None   Collection Time: 01/27/19  7:00 PM  Result Value Ref Range   Troponin I (High Sensitivity) 9 <18 ng/L    Comment: Performed at [facility] [address] [place], [place] [address]  Brain natriuretic peptide     Status: Abnormal   Collection Time: 01/27/19  7:00 PM  Result Value Ref Range   B Natriuretic Peptide 194.9 (H) 0.0 - 100.0 pg/mL    Comment: Performed at [facility] [address] [place], [place] [address]  CBG monitoring, ED     Status: Abnormal   Collection  Time: 01/27/19  7:05 PM   Result Value Ref Range   Glucose-Capillary 173 (H) 70 - 99 mg/dL  POC SARS Coronavirus 2 Ag-ED - Nasal Swab (BD Veritor Kit)     Status: None   Collection Time: 01/27/19  7:24 PM  Result Value Ref Range   SARS Coronavirus 2 Ag NEGATIVE NEGATIVE    Comment: (NOTE) SARS-CoV-2 antigen NOT DETECTED.  Negative results are presumptive.  Negative results do not preclude SARS-CoV-2 infection and should not be used as the sole basis for treatment or other patient management decisions, including infection  control decisions, particularly in the presence of clinical signs and  symptoms consistent with COVID-19, or in those who have been in contact with the virus.  Negative results must be combined with clinical observations, patient history, and epidemiological information. The expected result is Negative. Fact Sheet for Patients: PodPark.tn Fact Sheet for Healthcare Providers: GiftContent.is This test is not yet approved or cleared by the [place] FDA and  has been authorized for detection and/or diagnosis of SARS-CoV-2 by FDA under an Emergency Use Authorization (EUA).  This EUA will remain in effect (meaning this test can be used) for the duration of  the COVID-19 de claration under Section 564(b)(1) of the Act, 21 U.S.C. section 360bbb-3(b)(1), unless the authorization is terminated or revoked sooner.   D-dimer, quantitative (not at [facility])     Status: Abnormal   Collection Time: 01/27/19  7:54 PM  Result Value Ref Range   D-Dimer, Quant 1.47 (H) 0.00 - 0.50 ug/mL-FEU    Comment: (NOTE) At the manufacturer cut-off of 0.50 ug/mL FEU, this assay has been documented to exclude PE with a sensitivity and negative predictive value of 97 to 99%.  At this time, this assay has not been approved by the FDA to exclude DVT/VTE. Results should be correlated with clinical presentation. Performed at [facility], [place] [address], [place], [place] [address]   Troponin I (High Sensitivity)     Status: None   Collection Time: 01/27/19  8:29 PM  Result Value Ref Range   Troponin I (High Sensitivity) 10 <18 ng/L    Comment: (NOTE) Elevated high sensitivity troponin I (hsTnI) values and significant  changes across serial measurements may suggest ACS but many other  chronic and acute conditions are known to elevate hsTnI results.  Refer to the "Links" section for chest pain algorithms and additional  guidance. Performed at [facility], [place] [address], [place], [place] [address]    CT Angio Chest PE W and/or Wo Contrast  Result Date: 01/27/2019 CLINICAL DATA:  Hyperglycemia cough generalized weakness and positive D-dimer EXAM: CT ANGIOGRAPHY CHEST WITH CONTRAST TECHNIQUE: Multidetector CT imaging of the chest was performed using the standard protocol during bolus administration of intravenous contrast. Multiplanar CT image reconstructions and MIPs were obtained to evaluate the vascular anatomy. CONTRAST:  27m OMNIPAQUE IOHEXOL 350 MG/ML SOLN COMPARISON:  Chest x-ray 01/27/2019, CT 06/01/2016 FINDINGS: Cardiovascular: Satisfactory opacification of the pulmonary arteries to the segmental level. No evidence of pulmonary embolism. Moderate aortic atherosclerosis. No aneurysmal dilatation. Coronary vascular calcification. Borderline to mild cardiomegaly. No pericardial effusion. Mediastinum/Nodes: Midline trachea. No thyroid mass. Borderline to slightly enlarged mediastinal lymph nodes. Lymph node adjacent to the aortic arch measures 13 mm. Right paratracheal node measures 15 mm. Subcarinal lymph node measures 16 mm. Small bilateral hilar nodes. Esophagus within normal limits Lungs/Pleura: Small bilateral pleural effusions. Diffuse bilateral hazy lung density. Suggestion of mild septal thickening. No  pneumothorax. 11 mm right middle lobe pulmonary nodule, series 6, image number 64, stable compared to multiple priors and  therefore felt benign. Passive atelectasis at both bases Upper Abdomen: Splenic granuloma. Slightly nodular liver contour, possible cirrhosis. No acute abnormality. Musculoskeletal: Sternum is intact. Degenerative changes of the spine. No acute osseous abnormality. Review of the MIP images confirms the above findings. IMPRESSION: 1. Negative for acute pulmonary embolus. 2. Small bilateral pleural effusions. Diffuse hazy attenuation of the lung fields with suggestion of septal thickening, suggestive of pulmonary edema. 3. Mild mediastinal adenopathy, either reactive, due to infection, or potentially lymphoproliferative disease. 4. Nodular liver contour suggesting possible cirrhosis 5. No change in 11 mm right middle lobe lung nodule, felt benign given lack of interval change Aortic Atherosclerosis (ICD10-I70.0). Electronically Signed   By: Donavan Foil M.D.   On: 01/27/2019 23:41   DG Chest Portable 1 View  Result Date: 01/27/2019 CLINICAL DATA:  Shortness of breath. EXAM: PORTABLE CHEST 1 VIEW COMPARISON:  Jun 05, 2016. FINDINGS: Stable cardiomediastinal silhouette. Atherosclerosis of thoracic aorta is noted. No pneumothorax is noted. Minimal bibasilar subsegmental atelectasis or scarring is noted. Status post right shoulder arthroplasty. IMPRESSION: Aortic atherosclerosis. Minimal bibasilar subsegmental atelectasis or scarring. Electronically Signed   By: Marijo Conception M.D.   On: 01/27/2019 19:00    Pending Labs Unresulted Labs (From admission, onward)    Start     Ordered   01/28/19 0100  Hemoglobin A1c  Once,   STAT    Comments: To assess prior glycemic control    01/28/19 0059   01/27/19 2004  SARS CORONAVIRUS 2 (TAT 6-24 HRS) Nasopharyngeal Nasopharyngeal Swab  (Tier 3 (TAT 6-24 hrs))  Once,   STAT    Question Answer Comment  Is this test for diagnosis or screening Diagnosis of ill patient   Symptomatic for COVID-19 as defined by CDC Yes   Date of Symptom Onset 01/24/2019   Hospitalized  for COVID-19 Yes   Admitted to ICU for COVID-19 No   Previously tested for COVID-19 Yes   Resident in a congregate (group) care setting Unknown   Employed in healthcare setting Unknown   Pregnant No      01/27/19 2003   Signed and Held  Basic metabolic panel  Daily,   R     Signed and Held   Signed and Held  CBC  (heparin)  Once,   R    Comments: Baseline for heparin therapy IF NOT ALREADY DRAWN.  Notify MD if PLT < 100 K.    Signed and Held   Signed and Held  Creatinine, serum  (heparin)  Once,   R    Comments: Baseline for heparin therapy IF NOT ALREADY DRAWN.    Signed and Held          Vitals/Pain Today's Vitals   01/27/19 2204 01/27/19 2230 01/27/19 2245 01/28/19 0007  BP: (!) 173/95 (!) 156/72 (!) 123/92 (!) 132/97  Pulse: 66 66 66 66  Resp: '13 13 16 17  '$ Temp:      TempSrc:      SpO2: 92% 94% 97% 98%  PainSc:        Isolation Precautions No active isolations  Medications Medications  insulin aspart (novoLOG) injection 0-15 Units (has no administration in time range)  insulin aspart (novoLOG) injection 0-5 Units (has no administration in time range)  sodium chloride flush (NS) 0.9 % injection 3 mL (3 mLs Intravenous Given 01/27/19 2038)  iohexol (OMNIPAQUE) 350 MG/ML injection  75 mL (75 mLs Intravenous Contrast Given 01/27/19 2317)    Mobility walks Low fall risk   Focused Assessments Cardiac Assessment Handoff:    No results found for: CKTOTAL, CKMB, CKMBINDEX, TROPONINI Lab Results  Component Value Date   DDIMER 1.47 (H) 01/27/2019   Does the Patient currently have chest pain? No     R Recommendations: See Admitting Provider Note  Report given to:   Additional Notes:

## 2019-01-28 NOTE — Progress Notes (Signed)
  Echocardiogram 2D Echocardiogram has been performed.  Allison Duffy 01/28/2019, 9:40 AM

## 2019-01-28 NOTE — H&P (Addendum)
History and Physical   FOSTER STOGSDILL [id number] DOB: [birth year] DOA: 01/27/2019  Referring MD/NP/PA: Dr. Sedonia Small  PCP: Burnard Bunting, MD   Outpatient Specialists: None  Patient coming from: Home  Chief Complaint: Shortness of breath  HPI: Allison Duffy is a 86 y.o. female with medical history significant of diastolic dysfunction CHF, osteoarthritis, hypertension, hyperlipidemia, non-insulin-dependent diabetes who presents with progressive shortness of breath with exertion and at rest.  Is been going on for a few days.  Patient presented with wheezing and is having new oxygen requirement up to 3 L.  Screening done in the ER showed bilateral pleural effusion and pulmonary edema.  No evidence of PE.  COVID-19 is negative.  Patient has no chest pain.  Denied any hemoptysis.  Denied any nausea vomiting diarrhea.  She is however weak and debilitated.  Based on patient's history and exam it appears she is having acute exacerbation of CHF and she will be admitted to the hospital for treatment.Patient was at her grandson's house for Christmas and has not complied with her diet.  ED Course: Temperature is 98 for blood pressure 181/72 pulse 66 respirate of 22 oxygen sats 92% on room air 99% on 2 L.  White count 8.5 hemoglobin 12.2 platelet 226.  Chemistry appears to be within normal except for CO2 of 19.  CT angiogram of the chest showed no PE.  Bilateral pulmonary edema and small pleural effusions consistent with CHF exacerbation.  Patient diuresed and is being admitted for treatment.  Review of Systems: As per HPI otherwise 10 point review of systems negative.    Past Medical History:  Diagnosis Date  . Acute on chronic combined systolic and diastolic CHF (congestive heart failure) ([place]) 01/28/2019  . Arthritis    "scattered joints" (07/29/2013)  . Chest pain   . Colitis   . Gout attack ?1980's  . Hyperlipidemia   . Hypertension   . PONV (postoperative nausea and vomiting)   . Type II  diabetes mellitus ([place])     Past Surgical History:  Procedure Laterality Date  . APPENDECTOMY    . CARPAL TUNNEL RELEASE Right   . CATARACT EXTRACTION W/ INTRAOCULAR LENS  IMPLANT, BILATERAL Bilateral   . CHOLECYSTECTOMY    . KNEE ARTHROSCOPY Right   . OLECRANON BURSECTOMY Left 07/29/2013   "in ER"  . PARATHYROIDECTOMY    . TONSILLECTOMY    . TOTAL SHOULDER REPLACEMENT  2017  . VAGINAL HYSTERECTOMY       reports that she quit smoking about 65 years ago. Her smoking use included cigarettes. She quit after 5.00 years of use. She has never used smokeless tobacco. She reports that she does not drink alcohol or use drugs.  Allergies  Allergen Reactions  . Codeine Nausea And Vomiting  . Hydrocodone     Nausea and vomiting  . Sulfa Antibiotics Itching and Nausea Only    Family History  Problem Relation Age of Onset  . Hypertension Father   . Heart disease Father   . Heart disease Mother   . Diabetes Mother   . Hypertension Mother   . Heart disease Brother        both brothers  . Diabetes Brother   . Hypertension Brother      Prior to Admission medications   Medication Sig Start Date End Date Taking? Authorizing Provider  acetaminophen (TYLENOL) 500 MG tablet Take 1,000 mg by mouth every 6 (six) hours as needed.    [provider]  aluminum-magnesium hydroxide-simethicone (  MAALOX) 200-200-20 MG/5ML SUSP Take 30 mLs by mouth 4 (four) times daily -  before meals and at bedtime.    [provider]  amLODipine (NORVASC) 10 MG tablet Take 10 mg by mouth daily.    [provider]  ascorbic acid (VITAMIN C) 500 MG tablet Take 500 mg by mouth 3 (three) times daily.    [provider]  aspirin 81 MG tablet Take 81 mg by mouth 2 (two) times daily.     [provider]  Calcium Carbonate (CALCIUM 600 PO) Take 1,200 mg by mouth daily.    [provider]  cholecalciferol (VITAMIN D) 1000 UNITS tablet Take 1,000 Units by mouth daily.     [provider]  cloNIDine (CATAPRES) 0.1 MG tablet Take 0.1 mg by mouth 2 (two) times daily.    [provider]  furosemide (LASIX) 40 MG tablet Take 40 mg by mouth daily.    [provider]  gabapentin (NEURONTIN) 100 MG capsule Take 1 capsule (100 mg total) by mouth 3 (three) times daily. 01/26/18   Volanda Napoleon, PA-C  Glucosamine HCl (GLUCOSAMINE PO) Take 2,000 mg by mouth daily.    [provider]  insulin aspart (NOVOLOG) 100 UNIT/ML injection Inject 6 Units into the skin 3 (three) times daily with meals. 06/06/16   Johnson, Clanford L, MD  insulin glargine (LANTUS) 100 UNIT/ML injection Inject 0.15 mLs (15 Units total) into the skin every morning. 06/07/16   Johnson, Clanford L, MD  lisinopril (PRINIVIL,ZESTRIL) 40 MG tablet Take 40 mg by mouth daily.    [provider]  magnesium oxide (MAG-OX) 400 MG tablet Take 400 mg by mouth daily.    [provider]  metFORMIN (GLUCOPHAGE) 500 MG tablet Take 500 mg by mouth daily with breakfast.     [provider]  metoprolol succinate (TOPROL-XL) 50 MG 24 hr tablet Take 50 mg by mouth 2 (two) times daily. 05/05/16   [provider]  Multiple Vitamin (MULTIVITAMIN WITH MINERALS) TABS Take 1 tablet by mouth 2 (two) times daily. Vitality Multivitamin & mineral    [provider]  omeprazole (PRILOSEC) 40 MG capsule Take 1 capsule (40 mg total) by mouth daily. 06/06/16   Johnson, Clanford L, MD  Potassium 99 MG TABS Take 99 mg by mouth daily.    [provider]  pravastatin (PRAVACHOL) 20 MG tablet Take 20 mg by mouth daily.    [provider]  predniSONE (STERAPRED UNI-PAK 21 TAB) 10 MG (21) TBPK tablet Take by mouth daily. Take 6 tabs by mouth daily  for 2 days, then 5 tabs for 2 days, then 4 tabs for 2 days, then 3 tabs for 2 days, 2 tabs for 2 days, then 1 tab by mouth daily for 2 days 01/26/18   [place] Lanius A, PA-C  sitaGLIPtin (JANUVIA) 100 MG  tablet Take 100 mg by mouth daily.    [provider]  traMADol (ULTRAM) 50 MG tablet Take 1 tablet (50 mg total) by mouth every 6 (six) hours as needed. 01/26/18   Volanda Napoleon, PA-C  vitamin E 400 UNIT capsule Take 400 Units by mouth daily.    [provider]    Physical Exam: Vitals:   01/27/19 2204 01/27/19 2230 01/27/19 2245 01/28/19 0007  BP: (!) 173/95 (!) 156/72 (!) 123/92 (!) 132/97  Pulse: 66 66 66 66  Resp: 13 13 16 17   Temp:      TempSrc:  SpO2: 92% 94% 97% 98%      Constitutional: Frail, weak, in mild respiratory distress Vitals:   01/27/19 2204 01/27/19 2230 01/27/19 2245 01/28/19 0007  BP: (!) 173/95 (!) 156/72 (!) 123/92 (!) 132/97  Pulse: 66 66 66 66  Resp: 13 13 16 17   Temp:      TempSrc:      SpO2: 92% 94% 97% 98%   Eyes: PERRL, lids and conjunctivae normal ENMT: Mucous membranes are moist. Posterior pharynx clear of any exudate or lesions.Normal dentition.  Neck: normal, supple, no masses, no thyromegaly Respiratory: Decreased air entry bilaterally with basal crackles, some rhonchi. No accessory muscle use.  Cardiovascular: Regular rate and rhythm, no murmurs / rubs / gallops. 2+ extremity edema. 2+ pedal pulses. No carotid bruits.  Abdomen: no tenderness, no masses palpated. No hepatosplenomegaly. Bowel sounds positive.  Musculoskeletal: no clubbing / cyanosis. No joint deformity upper and lower extremities. Good ROM, no contractures. Normal muscle tone.  Skin: no rashes, lesions, ulcers. No induration Neurologic: CN 2-12 grossly intact. Sensation intact, DTR normal. Strength 5/5 in all 4.  Psychiatric: Normal judgment and insight.    Labs on Admission: I have personally reviewed following labs and imaging studies  CBC: Recent Labs  Lab 01/27/19 1508  WBC 8.5  HGB 12.2  HCT 35.9*  MCV 97.3  PLT [id number]   Basic Metabolic Panel: Recent Labs  Lab 01/27/19 1508  NA 141  K 4.2  CL 108  CO2 19*  GLUCOSE 115*  BUN 20    CREATININE 0.99  CALCIUM 9.4   GFR: CrCl cannot be calculated (Unknown ideal weight.). Liver Function Tests: Recent Labs  Lab 01/27/19 1900  AST 30  ALT 25  ALKPHOS 48  BILITOT 0.5  PROT 7.3  ALBUMIN 3.9   No results for input(s): LIPASE, AMYLASE in the last 168 hours. No results for input(s): AMMONIA in the last 168 hours. Coagulation Profile: No results for input(s): INR, PROTIME in the last 168 hours. Cardiac Enzymes: No results for input(s): CKTOTAL, CKMB, CKMBINDEX, TROPONINI in the last 168 hours. BNP (last 3 results) No results for input(s): PROBNP in the last 8760 hours. HbA1C: No results for input(s): HGBA1C in the last 72 hours. CBG: Recent Labs  Lab 01/27/19 1905  GLUCAP 173*   Lipid Profile: No results for input(s): CHOL, HDL, LDLCALC, TRIG, CHOLHDL, LDLDIRECT in the last 72 hours. Thyroid Function Tests: No results for input(s): TSH, T4TOTAL, FREET4, T3FREE, THYROIDAB in the last 72 hours. Anemia Panel: No results for input(s): VITAMINB12, FOLATE, FERRITIN, TIBC, IRON, RETICCTPCT in the last 72 hours. Urine analysis:    Component Value Date/Time   COLORURINE STRAW (A) 01/27/2019 1625   APPEARANCEUR CLEAR 01/27/2019 1625   LABSPEC 1.004 (L) 01/27/2019 1625   PHURINE 5.0 01/27/2019 1625   GLUCOSEU NEGATIVE 01/27/2019 1625   HGBUR NEGATIVE 01/27/2019 1625   BILIRUBINUR NEGATIVE 01/27/2019 1625   KETONESUR NEGATIVE 01/27/2019 1625   PROTEINUR NEGATIVE 01/27/2019 1625   UROBILINOGEN 0.2 06/23/2014 1140   NITRITE NEGATIVE 01/27/2019 1625   LEUKOCYTESUR NEGATIVE 01/27/2019 1625   Sepsis Labs: @LABRCNTIP (procalcitonin:4,lacticidven:4) )No results found for this or any previous visit (from the past 240 hour(s)).   Radiological Exams on Admission: CT Angio Chest PE W and/or Wo Contrast  Result Date: 01/27/2019 CLINICAL DATA:  Hyperglycemia cough generalized weakness and positive D-dimer EXAM: CT ANGIOGRAPHY CHEST WITH CONTRAST TECHNIQUE:  Multidetector CT imaging of the chest was performed using the standard protocol during bolus administration of intravenous contrast. Multiplanar  CT image reconstructions and MIPs were obtained to evaluate the vascular anatomy. CONTRAST:  100mL OMNIPAQUE IOHEXOL 350 MG/ML SOLN COMPARISON:  Chest x-ray 01/27/2019, CT 06/01/2016 FINDINGS: Cardiovascular: Satisfactory opacification of the pulmonary arteries to the segmental level. No evidence of pulmonary embolism. Moderate aortic atherosclerosis. No aneurysmal dilatation. Coronary vascular calcification. Borderline to mild cardiomegaly. No pericardial effusion. Mediastinum/Nodes: Midline trachea. No thyroid mass. Borderline to slightly enlarged mediastinal lymph nodes. Lymph node adjacent to the aortic arch measures 13 mm. Right paratracheal node measures 15 mm. Subcarinal lymph node measures 16 mm. Small bilateral hilar nodes. Esophagus within normal limits Lungs/Pleura: Small bilateral pleural effusions. Diffuse bilateral hazy lung density. Suggestion of mild septal thickening. No pneumothorax. 11 mm right middle lobe pulmonary nodule, series 6, image number 64, stable compared to multiple priors and therefore felt benign. Passive atelectasis at both bases Upper Abdomen: Splenic granuloma. Slightly nodular liver contour, possible cirrhosis. No acute abnormality. Musculoskeletal: Sternum is intact. Degenerative changes of the spine. No acute osseous abnormality. Review of the MIP images confirms the above findings. IMPRESSION: 1. Negative for acute pulmonary embolus. 2. Small bilateral pleural effusions. Diffuse hazy attenuation of the lung fields with suggestion of septal thickening, suggestive of pulmonary edema. 3. Mild mediastinal adenopathy, either reactive, due to infection, or potentially lymphoproliferative disease. 4. Nodular liver contour suggesting possible cirrhosis 5. No change in 11 mm right middle lobe lung nodule, felt benign given lack of interval  change Aortic Atherosclerosis (ICD10-I70.0). Electronically Signed   By: Donavan Foil M.D.   On: 01/27/2019 23:41   DG Chest Portable 1 View  Result Date: 01/27/2019 CLINICAL DATA:  Shortness of breath. EXAM: PORTABLE CHEST 1 VIEW COMPARISON:  Jun 05, 2016. FINDINGS: Stable cardiomediastinal silhouette. Atherosclerosis of thoracic aorta is noted. No pneumothorax is noted. Minimal bibasilar subsegmental atelectasis or scarring is noted. Status post right shoulder arthroplasty. IMPRESSION: Aortic atherosclerosis. Minimal bibasilar subsegmental atelectasis or scarring. Electronically Signed   By: Marijo Conception M.D.   On: 01/27/2019 19:00    EKG: Independently reviewed.  Controlled rate a flutter with 4-1 block rate of 67.  Evidence of LVH by voltage criteria.  No significant ST changes  Assessment/Plan Principal Problem:   Acute on chronic combined systolic and diastolic CHF (congestive heart failure) ([facility]) Active Problems:   Essential hypertension   Hyperlipidemia   Type 2 diabetes mellitus ([place])     #1 acute on chronic CHF exacerbation: Last echo in the system was from 2015.  Suspected combined diastolic and systolic heart failure.  Will admit the patient.  Initiate IV diuresis with Lasix.  She has been on oral Lasix at home.  Also been on beta-blockers and ACE inhibitors.  We will resume the dose once confirmed.  Continue with oxygenation and monitor response.  Cardiology consult most likely after the echo.  #2 essential hypertension: Blood pressure is elevated.  Resume home regimen and adjust dose.  #3 diabetes: Initiate sliding scale insulin.  #4 hyperlipidemia: Continue with Pravachol.   DVT prophylaxis: Heparin Code Status: Full code Family Communication: No family at bedside Disposition Plan: Home Consults called: None but may need cardiology consult in the morning Admission status: Inpatient  Severity of Illness: The appropriate patient status for this patient is  INPATIENT. Inpatient status is judged to be reasonable and necessary in order to provide the required intensity of service to ensure the patient's safety. The patient's presenting symptoms, physical exam findings, and initial radiographic and laboratory data in the context of their chronic comorbidities  is felt to place them at high risk for further clinical deterioration. Furthermore, it is not anticipated that the patient will be medically stable for discharge from the hospital within 2 midnights of admission. The following factors support the patient status of inpatient.   " The patient's presenting symptoms include shortness of breath and cough with exertion. " The worrisome physical exam findings include bilateral basal crackles. " The initial radiographic and laboratory data are worrisome because of CT findings of pulmonary edema. " The chronic co-morbidities include hypertension diabetes.   * I certify that at the point of admission it is my clinical judgment that the patient will require inpatient hospital care spanning beyond 2 midnights from the point of admission due to high intensity of service, high risk for further deterioration and high frequency of surveillance required.Barbette Merino MD [facility] Pager [phone]  If 7PM-7AM, please contact night-coverage [email or web address] Password [facility]  01/28/2019, 12:47 AM

## 2019-01-28 NOTE — Progress Notes (Addendum)
PROGRESS NOTE    Allison Duffy  [id number] DOB: 08/26/1926 DOA: 01/27/2019 PCP: Burnard Bunting, MD  Outpatient Specialists:   Brief Narrative:   Admitted 01/27/19 - 01/27/18: CHF exacerbation, IV lasix initiated, some confusion over home meds, planning for echo  Today 01/28/19: Echo performed, showed moderate to severe mitral regurg, mild LVH.  Assessment & Plan:   Principal Problem:   Acute on chronic combined systolic and diastolic CHF (congestive heart failure) (HCC) Active Problems:   Essential hypertension   Hyperlipidemia   Type 2 diabetes mellitus (HCC)   Acute on Chronic CHF: Continue Lasix  Echo results as above: Moderate to severe MR, mild LVH If adequate diuresis and no concerns on EKG may be able to d/c tomorrow  Atrial Flutter New on EKG from ED Repeat EKG prior to discharge   HTN Meds as below BP not at goal, will consider increase meds/add CaCB to BB, ACE, Lasix  HLD Meds as below  DM2 Continue SSI   DVT prophylaxis: Heparin Code Status: Full code Family Communication: No family at bedside Disposition Plan: Home eventually! Pending results as above. Hopefully 01/28/18 Consults called: None but may need cardiology consult in the morning Admission status: Inpatient   Procedures:  Echo pending   Antimicrobials:  Anti-infectives (From admission, onward)   None       Subjective: Pt feeling well, breathing better, still weak   Objective: Vitals:   01/28/19 0500 01/28/19 0749 01/28/19 0943 01/28/19 1142  BP:  (!) 181/75 (!) 190/79 (!) 164/99  Pulse:  67 66 65  Resp: 18 17 20 17   Temp: 98 F (36.7 C) 99.1 F (37.3 C)  98.8 F (37.1 C)  TempSrc: Oral Oral  Oral  SpO2:  95% 100% 98%  Weight:        Intake/Output Summary (Last 24 hours) at 01/28/2019 1320 Last data filed at 01/28/2019 0947 Gross per 24 hour  Intake 240 ml  Output 2050 ml  Net -1810 ml   Filed Weights   01/28/19 0246  Weight: 80.6 kg     Examination:  General exam: Appears calm and comfortable  Respiratory system: Clear to auscultation. Respiratory effort normal. Cardiovascular system: S1 & S2 heard, RRR. No JVD, murmurs, rubs, gallops or clicks. No pedal edema. Gastrointestinal system: Abdomen is nondistended, soft and nontender. No organomegaly or masses felt. Normal bowel sounds heard. Central nervous system: Alert and oriented. No focal neurological deficits. Extremities: Symmetric 5 x 5 power. Skin: No rashes, lesions or ulcers Psychiatry: Judgement and insight appear normal. Mood & affect appropriate.     Data Reviewed: I have personally reviewed following labs and imaging studies  CBC: Recent Labs  Lab 01/27/19 1508 01/28/19 0309  WBC 8.5 8.3  HGB 12.2 12.2  HCT 35.9* 35.0*  MCV 97.3 96.4  PLT 226 [id number]   Basic Metabolic Panel: Recent Labs  Lab 01/27/19 1508 01/28/19 0309  NA 141  --   K 4.2  --   CL 108  --   CO2 19*  --   GLUCOSE 115*  --   BUN 20  --   CREATININE 0.99 1.09*  CALCIUM 9.4  --    GFR: CrCl cannot be calculated (Unknown ideal weight.). Liver Function Tests: Recent Labs  Lab 01/27/19 1900  AST 30  ALT 25  ALKPHOS 48  BILITOT 0.5  PROT 7.3  ALBUMIN 3.9   No results for input(s): LIPASE, AMYLASE in the last 168 hours. No results for input(s): AMMONIA in the  last 168 hours. Coagulation Profile: No results for input(s): INR, PROTIME in the last 168 hours. Cardiac Enzymes: No results for input(s): CKTOTAL, CKMB, CKMBINDEX, TROPONINI in the last 168 hours. BNP (last 3 results) No results for input(s): PROBNP in the last 8760 hours. HbA1C: Recent Labs    01/28/19 0309  HGBA1C 6.6*   CBG: Recent Labs  Lab 01/27/19 1905 01/28/19 0245 01/28/19 0740  GLUCAP 173* 137* 146*   Lipid Profile: No results for input(s): CHOL, HDL, LDLCALC, TRIG, CHOLHDL, LDLDIRECT in the last 72 hours. Thyroid Function Tests: No results for input(s): TSH, T4TOTAL, FREET4, T3FREE,  THYROIDAB in the last 72 hours. Anemia Panel: No results for input(s): VITAMINB12, FOLATE, FERRITIN, TIBC, IRON, RETICCTPCT in the last 72 hours. Urine analysis:    Component Value Date/Time   COLORURINE STRAW (A) 01/27/2019 1625   APPEARANCEUR CLEAR 01/27/2019 1625   LABSPEC 1.004 (L) 01/27/2019 1625   PHURINE 5.0 01/27/2019 1625   GLUCOSEU NEGATIVE 01/27/2019 1625   HGBUR NEGATIVE 01/27/2019 1625   BILIRUBINUR NEGATIVE 01/27/2019 1625   KETONESUR NEGATIVE 01/27/2019 1625   PROTEINUR NEGATIVE 01/27/2019 1625   UROBILINOGEN 0.2 06/23/2014 1140   NITRITE NEGATIVE 01/27/2019 1625   LEUKOCYTESUR NEGATIVE 01/27/2019 1625   Sepsis Labs: @LABRCNTIP (procalcitonin:4,lacticidven:4)  ) Recent Results (from the past 240 hour(s))  SARS CORONAVIRUS 2 (TAT 6-24 HRS) Nasopharyngeal Nasopharyngeal Swab     Status: None   Collection Time: 01/27/19  8:04 PM   Specimen: Nasopharyngeal Swab  Result Value Ref Range Status   SARS Coronavirus 2 NEGATIVE NEGATIVE Final    Comment: (NOTE) SARS-CoV-2 target nucleic acids are NOT DETECTED. The SARS-CoV-2 RNA is generally detectable in upper and lower respiratory specimens during the acute phase of infection. Negative results do not preclude SARS-CoV-2 infection, do not rule out co-infections with other pathogens, and should not be used as the sole basis for treatment or other patient management decisions. Negative results must be combined with clinical observations, patient history, and epidemiological information. The expected result is Negative. Fact Sheet for Patients: SugarRoll.be Fact Sheet for Healthcare Providers: https://www.woods-mathews.com/ This test is not yet approved or cleared by the [place] FDA and  has been authorized for detection and/or diagnosis of SARS-CoV-2 by FDA under an Emergency Use Authorization (EUA). This EUA will remain  in effect (meaning this test can be used) for the  duration of the COVID-19 declaration under Section 56 4(b)(1) of the Act, 21 U.S.C. section 360bbb-3(b)(1), unless the authorization is terminated or revoked sooner. Performed at [facility], [place] [address], [place], [place] [address]          Radiology Studies: CT Angio Chest PE W and/or Wo Contrast  Result Date: 01/27/2019 CLINICAL DATA:  Hyperglycemia cough generalized weakness and positive D-dimer EXAM: CT ANGIOGRAPHY CHEST WITH CONTRAST TECHNIQUE: Multidetector CT imaging of the chest was performed using the standard protocol during bolus administration of intravenous contrast. Multiplanar CT image reconstructions and MIPs were obtained to evaluate the vascular anatomy. CONTRAST:  36mL OMNIPAQUE IOHEXOL 350 MG/ML SOLN COMPARISON:  Chest x-ray 01/27/2019, CT 06/01/2016 FINDINGS: Cardiovascular: Satisfactory opacification of the pulmonary arteries to the segmental level. No evidence of pulmonary embolism. Moderate aortic atherosclerosis. No aneurysmal dilatation. Coronary vascular calcification. Borderline to mild cardiomegaly. No pericardial effusion. Mediastinum/Nodes: Midline trachea. No thyroid mass. Borderline to slightly enlarged mediastinal lymph nodes. Lymph node adjacent to the aortic arch measures 13 mm. Right paratracheal node measures 15 mm. Subcarinal lymph node measures 16 mm. Small bilateral hilar nodes.  Esophagus within normal limits Lungs/Pleura: Small bilateral pleural effusions. Diffuse bilateral hazy lung density. Suggestion of mild septal thickening. No pneumothorax. 11 mm right middle lobe pulmonary nodule, series 6, image number 64, stable compared to multiple priors and therefore felt benign. Passive atelectasis at both bases Upper Abdomen: Splenic granuloma. Slightly nodular liver contour, possible cirrhosis. No acute abnormality. Musculoskeletal: Sternum is intact. Degenerative changes of the spine. No acute osseous abnormality. Review of the MIP images confirms  the above findings. IMPRESSION: 1. Negative for acute pulmonary embolus. 2. Small bilateral pleural effusions. Diffuse hazy attenuation of the lung fields with suggestion of septal thickening, suggestive of pulmonary edema. 3. Mild mediastinal adenopathy, either reactive, due to infection, or potentially lymphoproliferative disease. 4. Nodular liver contour suggesting possible cirrhosis 5. No change in 11 mm right middle lobe lung nodule, felt benign given lack of interval change Aortic Atherosclerosis (ICD10-I70.0). Electronically Signed   By: Donavan Foil M.D.   On: 01/27/2019 23:41   DG Chest Portable 1 View  Result Date: 01/27/2019 CLINICAL DATA:  Shortness of breath. EXAM: PORTABLE CHEST 1 VIEW COMPARISON:  Jun 05, 2016. FINDINGS: Stable cardiomediastinal silhouette. Atherosclerosis of thoracic aorta is noted. No pneumothorax is noted. Minimal bibasilar subsegmental atelectasis or scarring is noted. Status post right shoulder arthroplasty. IMPRESSION: Aortic atherosclerosis. Minimal bibasilar subsegmental atelectasis or scarring. Electronically Signed   By: Marijo Conception M.D.   On: 01/27/2019 19:00   ECHOCARDIOGRAM COMPLETE  Result Date: 01/28/2019   ECHOCARDIOGRAM REPORT   Patient Name:   Allison Duffy Date of Exam: 01/28/2019 Medical Rec #:  [id number]        Height:       66.0 in Accession #:    [id number]       Weight:       177.7 lb Date of Birth:  1926-09-11        BSA:          1.90 m Patient Age:    86 years         BP:           181/75 mmHg Patient Gender: F                HR:           67 bpm. Exam Location:  Inpatient Procedure: 2D Echo Indications:    CHF-Acute Diastolic 0000000  History:        Patient has prior history of Echocardiogram examinations, most                 recent 07/20/2013. Signs/Symptoms:Shortness of Breath; Risk                 Factors:Hypertension, Dyslipidemia and Diabetes.  Sonographer:    Clayton Lefort RDCS (AE) Referring Phys: [id number] [facility] Julious Oka  Sonographer  Comments: Image acquisition challenging due to respiratory motion. IMPRESSIONS  1. Left ventricular ejection fraction, by visual estimation, is 60 to 65%. The left ventricle has normal function. There is mildly increased left ventricular hypertrophy.  2. Left ventricular diastolic function could not be evaluated.  3. The left ventricle has no regional wall motion abnormalities.  4. Global right ventricle has normal systolic function.The right ventricular size is normal. No increase in right ventricular wall thickness.  5. Left atrial size was normal.  6. Right atrial size was normal.  7. Presence of pericardial fat pad.  8. Trivial pericardial effusion is present.  9. The mitral valve is normal in structure. Moderate  to severe mitral valve regurgitation. 10. The tricuspid valve is normal in structure. 11. The aortic valve is tricuspid. Aortic valve regurgitation is not visualized. 12. The pulmonic valve was normal in structure. Pulmonic valve regurgitation is trivial. 13. Mildly elevated pulmonary artery systolic pressure. 14. The atrial septum is grossly normal. FINDINGS  Left Ventricle: Left ventricular ejection fraction, by visual estimation, is 60 to 65%. The left ventricle has normal function. The left ventricle has no regional wall motion abnormalities. There is mildly increased left ventricular hypertrophy. Asymmetric left ventricular hypertrophy. The left ventricular diastology could not be evaluated due to atrial fibrillation. Left ventricular diastolic function could not be evaluated. Right Ventricle: The right ventricular size is normal. No increase in right ventricular wall thickness. Global RV systolic function is has normal systolic function. The tricuspid regurgitant velocity is 2.74 m/s, and with an assumed right atrial pressure  of 3 mmHg, the estimated right ventricular systolic pressure is mildly elevated at 33.0 mmHg. Left Atrium: Left atrial size was normal in size. Right Atrium: Right atrial  size was normal in size Pericardium: Trivial pericardial effusion is present. Presence of pericardial fat pad. Mitral Valve: The mitral valve is normal in structure. Moderate to severe mitral valve regurgitation. MV peak gradient, 3.4 mmHg. Tricuspid Valve: The tricuspid valve is normal in structure. Tricuspid valve regurgitation mild-moderate. Aortic Valve: The aortic valve is tricuspid. Aortic valve regurgitation is not visualized. Mild aortic valve annular calcification. Aortic valve mean gradient measures 4.6 mmHg. Aortic valve peak gradient measures 7.9 mmHg. Aortic valve area, by VTI measures 1.83 cm. Pulmonic Valve: The pulmonic valve was normal in structure. Pulmonic valve regurgitation is trivial. Pulmonic regurgitation is trivial. Aorta: The aortic root and ascending aorta are structurally normal, with no evidence of dilitation. IAS/Shunts: The atrial septum is grossly normal.  LEFT VENTRICLE PLAX 2D LVIDd:         4.50 cm  Diastology LVIDs:         3.50 cm  LV e' lateral:   12.60 cm/s LV PW:         1.30 cm  LV E/e' lateral: 7.5 LV IVS:        1.60 cm  LV e' medial:    8.70 cm/s LVOT diam:     2.00 cm  LV E/e' medial:  10.9 LV SV:         42 ml LV SV Index:   21.23 LVOT Area:     3.14 cm  RIGHT VENTRICLE            IVC RV Basal diam:  3.70 cm    IVC diam: 1.80 cm RV Mid diam:    3.10 cm RV S prime:     9.45 cm/s TAPSE (M-mode): 1.7 cm LEFT ATRIUM             Index       RIGHT ATRIUM           Index LA diam:        5.00 cm 2.63 cm/m  RA Area:     17.00 cm LA Vol (A2C):   58.9 ml 30.97 ml/m RA Volume:   44.00 ml  23.14 ml/m LA Vol (A4C):   52.4 ml 27.55 ml/m LA Biplane Vol: 58.7 ml 30.87 ml/m  AORTIC VALVE AV Area (Vmax):    1.86 cm AV Area (Vmean):   1.79 cm AV Area (VTI):     1.83 cm AV Vmax:  140.80 cm/s AV Vmean:          101.160 cm/s AV VTI:            0.339 m AV Peak Grad:      7.9 mmHg AV Mean Grad:      4.6 mmHg LVOT Vmax:         83.20 cm/s LVOT Vmean:        57.620 cm/s LVOT  VTI:          0.198 m LVOT/AV VTI ratio: 0.58  AORTA Ao Root diam: 2.90 cm MITRAL VALVE                        TRICUSPID VALVE MV Area (PHT): 1.91 cm             TR Peak grad:   30.0 mmHg MV Peak grad:  3.4 mmHg             TR Vmax:        315.00 cm/s MV Mean grad:  1.0 mmHg MV Vmax:       0.93 m/s             SHUNTS MV Vmean:      43.7 cm/s            Systemic VTI:  0.20 m MV VTI:        0.24 m               Systemic Diam: 2.00 cm MV PHT:        115.42 msec MV Decel Time: 398 msec MR Peak grad:    125.9 mmHg MR Mean grad:    87.0 mmHg MR Vmax:         561.00 cm/s MR Vmean:        444.0 cm/s MR PISA:         3.08 cm MR PISA Eff ROA: 17 mm MR PISA Radius:  0.70 cm MV E velocity: 94.70 cm/s 103 cm/s MV A velocity: 31.10 cm/s 70.3 cm/s MV E/A ratio:  3.05       1.5  Mertie Moores MD Electronically signed by Mertie Moores MD Signature Date/Time: 01/28/2019/10:13:39 AM    Final         Scheduled Meds: . furosemide  40 mg Intravenous BID  . heparin  5,000 Units Subcutaneous Q8H  . insulin aspart  0-15 Units Subcutaneous TID WC  . insulin aspart  0-5 Units Subcutaneous QHS  . lisinopril  40 mg Oral Daily  . metoprolol succinate  50 mg Oral BID  . pravastatin  20 mg Oral Daily  . sodium chloride flush  3 mL Intravenous Q12H   Continuous Infusions: . sodium chloride       LOS: 0 days    Time spent: 25 minutes    Emeterio Reeve, DO  01/28/2019, 1:20 PM

## 2019-01-29 LAB — BASIC METABOLIC PANEL
Anion gap: 14 (ref 5–15)
Anion gap: 11 (ref 5–15)
BUN: 13 mg/dL (ref 8–23)
BUN: 18 mg/dL (ref 8–23)
CO2: 25 mmol/L (ref 22–32)
CO2: 26 mmol/L (ref 22–32)
Calcium: 8.7 mg/dL — ABNORMAL LOW (ref 8.9–10.3)
Calcium: 8.9 mg/dL (ref 8.9–10.3)
Chloride: 103 mmol/L (ref 98–111)
Chloride: 101 mmol/L (ref 98–111)
Creatinine, Ser: 0.87 mg/dL (ref 0.44–1.00)
Creatinine, Ser: 0.91 mg/dL (ref 0.44–1.00)
GFR calc Af Amer: >60 mL/min (ref >60)
GFR calc Af Amer: >60 mL/min (ref >60)
GFR calc non Af Amer: 59 mL/min — ABNORMAL LOW (ref >60)
GFR calc non Af Amer: 56 mL/min — ABNORMAL LOW (ref >60)
Glucose, Bld: 146 mg/dL — ABNORMAL HIGH (ref 70–99)
Glucose, Bld: 214 mg/dL — ABNORMAL HIGH (ref 70–99)
Potassium: 2.9 mmol/L — ABNORMAL LOW (ref 3.5–5.1)
Potassium: 3.5 mmol/L (ref 3.5–5.1)
Sodium: 142 mmol/L (ref 135–145)
Sodium: 138 mmol/L (ref 135–145)

## 2019-01-29 LAB — GLUCOSE, CAPILLARY
Glucose-Capillary: 189 mg/dL — ABNORMAL HIGH (ref 70–99)
Glucose-Capillary: 186 mg/dL — ABNORMAL HIGH (ref 70–99)

## 2019-01-29 MED ORDER — FUROSEMIDE 40 MG PO TABS: 40.0000 mg | ORAL_TABLET | Freq: Every day | ORAL | 0 refills | Status: DC

## 2019-01-29 MED ORDER — APIXABAN 2.5 MG PO TABS: 2.5000 mg | ORAL_TABLET | Freq: Two times a day (BID) | ORAL | 0 refills | Status: DC

## 2019-01-29 MED ORDER — POTASSIUM CHLORIDE 10 MEQ/100ML IV SOLN
10.0000 meq | Freq: Once | INTRAVENOUS | Status: AC
  Administered 2019-01-29: 10 meq via INTRAVENOUS
  Filled 2019-01-29: qty 100

## 2019-01-29 MED ORDER — POTASSIUM CHLORIDE CRYS ER 20 MEQ PO TBCR: 40.0000 meq | EXTENDED_RELEASE_TABLET | Freq: Every day | ORAL | 0 refills | Status: DC

## 2019-01-29 NOTE — Care Management (Signed)
Spoke w patient, she states that she walked up and down the hall and her oxygen was 92. She denied any other home needs for discharge, she states that she has a ride home.

## 2019-01-29 NOTE — Discharge Summary (Signed)
Physician Discharge Summary  Patient ID: Allison Duffy MRN: [id number] DOB/AGE: 1926-12-27 86 y.o.  Admit date: 01/27/2019 Discharge date: 01/29/2019  Admission Diagnoses: CHF exacerbation  Atrial Flutter   Discharge Diagnoses:  Principal Problem:   Acute on chronic combined systolic and diastolic CHF (congestive heart failure) ([place]) Active Problems:   Essential hypertension   Hyperlipidemia   Type 2 diabetes mellitus (HCC)   Atrial flutter by electrocardiogram [facility])   Discharged Condition: stable, good  Hospital Course:  Admitted with SOB, CHF exacerbation, responded well to diuresis. Atrial flutter on EKG x2. Started on anticoagulation, pt is on beta blocker or rate control, continue Lasix at home.   Consults: None  Significant Diagnostic Studies:  EKG Aflutter Echo normal EF, (+)MR  To follow:  PCP to follow renal fxn on Eliquis, consider repeat EKG, monitor BP, determine need for continued Lasix   Cardiology reestablish care w/ in 1 month     Discharge Exam: Blood pressure (!) 163/78, pulse 66, temperature 97.6 F (36.4 C), temperature source Oral, resp. rate 20, weight 77.9 kg, SpO2 100 %. Constitutional:  . VSS, see nurse notes . General Appearance: alert, well-developed, well-nourished, NAD Eyes: Marland Kitchen Normal lids and conjunctive, non-icteric sclera . PERRLA Ears, Nose, Mouth, Throat: . Normal appearance . MMM, posterior pharynx without erythema/exudate Neck: . No masses, trachea midline Respiratory: . Normal respiratory effort . Breath sounds normal, no wheeze/rhonchi, very slight rales Cardiovascular: . S1/S2 normal, minimal mitral murmur, no rub/gallop auscultated . No carotid bruit or JVD . Trace lower extremity edema Neurological: . No cranial nerve deficit on limited exam Psychiatric: . Normal judgment/insight . Normal mood and affect   Disposition: Discharge disposition: 01-Home or Self Care       Discharge Instructions    (HEART  FAILURE PATIENTS) Call MD:  Anytime you have any of the following symptoms: 1) 3 pound weight gain in 24 hours or 5 pounds in 1 week 2) shortness of breath, with or without a dry hacking cough 3) swelling in the hands, feet or stomach 4) if you have to sleep on extra pillows at night in order to breathe.   Complete by: As directed    Call MD for:  difficulty breathing, headache or visual disturbances   Complete by: As directed    Call MD for:  extreme fatigue   Complete by: As directed    Call MD for:  persistant dizziness or light-headedness   Complete by: As directed    Call MD for:  severe uncontrolled pain   Complete by: As directed    Call MD for:  temperature >100.4   Complete by: As directed    Diet - low sodium heart healthy   Complete by: As directed    Increase activity slowly   Complete by: As directed    Increase activity slowly   Complete by: As directed      Allergies as of 01/29/2019      Reactions   Codeine Nausea And Vomiting   Hydrocodone    Nausea and vomiting   Sulfa Antibiotics Itching, Nausea Only      Medication List    TAKE these medications   acetaminophen 500 MG tablet Commonly known as: TYLENOL Take 1,000 mg by mouth every 6 (six) hours as needed.   aluminum-magnesium hydroxide-simethicone [id number] MG/5ML Susp Commonly known as: MAALOX Take 30 mLs by mouth 4 (four) times daily -  before meals and at bedtime.   amLODipine 10 MG tablet Commonly known as:  NORVASC Take 10 mg by mouth daily.   apixaban 2.5 MG Tabs tablet Commonly known as: Eliquis Take 1 tablet (2.5 mg total) by mouth 2 (two) times daily.   ascorbic acid 500 MG tablet Commonly known as: VITAMIN C Take 500 mg by mouth daily.   aspirin 81 MG tablet Take 81 mg by mouth 2 (two) times daily.   CALCIUM 600 PO Take 1,200 mg by mouth daily.   cholecalciferol 25 MCG (1000 UT) tablet Commonly known as: VITAMIN D Take 1,000 Units by mouth daily.   cholestyramine 4 g  packet Commonly known as: QUESTRAN Take 4 g by mouth daily.   cloNIDine 0.1 MG tablet Commonly known as: CATAPRES Take 0.1 mg by mouth 2 (two) times daily.   furosemide 40 MG tablet Commonly known as: LASIX Take 40 mg by mouth daily. What changed: Another medication with the same name was added. Make sure you understand how and when to take each.   furosemide 40 MG tablet Commonly known as: Lasix Take 1 tablet (40 mg total) by mouth daily. Take 1 additional tablet daily if increased swelling, shortness of breath, or weight gain 5 lbs or more in 2 days What changed: You were already taking a medication with the same name, and this prescription was added. Make sure you understand how and when to take each.   gabapentin 100 MG capsule Commonly known as: Neurontin Take 1 capsule (100 mg total) by mouth 3 (three) times daily.   GLUCOSAMINE PO Take 2,000 mg by mouth daily.   lisinopril 40 MG tablet Commonly known as: ZESTRIL Take 40 mg by mouth daily.   magnesium oxide 400 MG tablet Commonly known as: MAG-OX Take 400 mg by mouth daily.   metFORMIN 500 MG tablet Commonly known as: GLUCOPHAGE Take 500 mg by mouth 2 (two) times daily with a meal.   metoprolol succinate 50 MG 24 hr tablet Commonly known as: TOPROL-XL Take 50 mg by mouth daily.   multivitamin with minerals Tabs tablet Take 1 tablet by mouth daily.   omeprazole 40 MG capsule Commonly known as: PRILOSEC Take 1 capsule (40 mg total) by mouth daily.   Potassium 99 MG Tabs Take 99 mg by mouth daily.   potassium chloride SA 20 MEQ tablet Commonly known as: KLOR-CON Take 2 tablets (40 mEq total) by mouth daily for 5 days.   pravastatin 20 MG tablet Commonly known as: PRAVACHOL Take 20 mg by mouth daily.   sitaGLIPtin 100 MG tablet Commonly known as: JANUVIA Take 100 mg by mouth daily.   vitamin E 400 UNIT capsule Take 400 Units by mouth daily.      Follow-up Information    Lorretta Harp, MD.  Call in 1 month(s).   Specialties: Cardiology, Radiology Why: follow up [place] within one month, pt has seen Dr Gwenlyn Found before, years ago  Contact information: [address] [place] [address] [phone]        Burnard Bunting, MD. Schedule an appointment as soon as possible for a visit in 1 week(s).   Specialty: Internal Medicine Why: follow up PCP  Contact information: [address] [place] [address] [phone]           Signed: Emeterio Reeve 01/29/2019, 2:38 PM

## 2019-01-29 NOTE — Discharge Instructions (Signed)
Atrial Flutter  Atrial flutter is a type of abnormal heart rhythm (arrhythmia). The heart has an electrical system that tells it how to beat. In atrial flutter, the signals move rapidly in the top chambers of the heart (the atria). This makes your heart beat very fast. Atrial flutter can come and go, or it can be permanent. The goal of treatment is to prevent blood clots from forming, control your heart rate, or restore your heartbeat to a normal rhythm. If this condition is not treated, it can cause serious problems, such as a weakened heart muscle (cardiomyopathy) or a stroke. What are the causes? This condition is often caused by conditions that damage the heart's electrical system. These include:  Heart conditions and heart surgery. These include heart attacks and open-heart surgery.  Lung problems, such as COPD or a blood clot in the lung (pulmonary embolism, or PE).  Poorly controlled high blood pressure (hypertension).  Overactive thyroid (hyperthyroidism).  Diabetes. In some cases, the cause of this condition is not known. What increases the risk? You are more likely to develop this condition if:  You are an elderly adult.  You are a man.  You are overweight (obese).  You have obstructive sleep apnea.  You have a family history of atrial flutter.  You have diabetes.  You drink a lot of alcohol, especially binge drinking.  You use drugs, including cannabis.  You smoke. What are the signs or symptoms? Symptoms of this condition include:  A feeling that your heart is pounding or racing (palpitations).  Shortness of breath.  Chest pain.  Feeling dizzy or light-headed.  Fainting.  Low blood pressure (hypotension).  Fatigue.  Tiring easily during exercise or activity. In some cases, there are no symptoms. How is this diagnosed? This condition may be diagnosed with:  An electrocardiogram (ECG) to check electrical signals of the heart.  An ambulatory  cardiac monitor to record your heart's activity for a few days.  An echocardiogram to create pictures of your heart.  A transesophageal echocardiogram (TEE) to create even better pictures of your heart.  A stress test to check your blood supply while you exercise.  Imaging tests, such as a CT scan or chest X-ray.  Blood tests. How is this treated? Treatment depends on underlying conditions and how you feel when you experience atrial flutter. This condition may be treated with:  Medicines to prevent blood clots or to treat heart rate or heart rhythm problems.  Electrical cardioversion to reset the heart's rhythm.  Ablation to remove the heart tissue that sends abnormal signals.  Left atrial appendage closure to seal the area where blood clots can form. In some cases, underlying conditions will be treated. Follow these instructions at home: Medicines  Take over-the-counter and prescription medicines only as told by your health care provider.  Do not take any new medicines without talking to your health care provider.  If you are taking blood thinners: ? Talk with your health care provider before you take any medicines that contain aspirin or NSAIDs, such as ibuprofen. These medicines increase your risk for dangerous bleeding. ? Take your medicine exactly as told, at the same time every day. ? Avoid activities that could cause injury or bruising, and follow instructions about how to prevent falls. ? Wear a medical alert bracelet or carry a card that lists what medicines you take. Lifestyle  Eat heart-healthy foods. Talk with a dietitian to make an eating plan that is right for you.  Do   not use any products that contain nicotine or tobacco, such as cigarettes, e-cigarettes, and chewing tobacco. If you need help quitting, ask your health care provider.  Do not drink alcohol.  Do not use drugs, including cannabis.  Lose weight if you are overweight or obese.  Exercise  regularly as instructed by your health care provider. General instructions  Do not use diet pills unless your health care provider approves. Diet pills may make heart problems worse.  If you have obstructive sleep apnea, manage your condition as told by your health care provider.  Keep all follow-up visits as told by your health care provider. This is important. Contact a health care provider if you:  Notice a change in the rate, rhythm, or strength of your heartbeat.  Are taking a blood thinner and you notice more bruising.  Have a sudden change in weight.  Tire more easily when you exercise or do heavy work. Get help right away if you have:  Pain or pressure in your chest.  Shortness of breath.  Fainting.  Increasing sweating with no known cause.  Side effects of blood thinners, such as blood in your vomit, stool, or urine, or bleeding that cannot stop.  Any symptoms of a stroke. "BE FAST" is an easy way to remember the main warning signs of a stroke: ? B - Balance. Signs are dizziness, sudden trouble walking, or loss of balance. ? E - Eyes. Signs are trouble seeing or a sudden change in vision. ? F - Face. Signs are sudden weakness or numbness of the face, or the face or eyelid drooping on one side. ? A - Arms. Signs are weakness or numbness in an arm. This happens suddenly and usually on one side of the body. ? S - Speech. Signs are sudden trouble speaking, slurred speech, or trouble understanding what people say. ? T - Time. Time to call emergency services. Write down what time symptoms started.  Other signs of a stroke, such as: ? A sudden, severe headache with no known cause. ? Nausea or vomiting. ? Seizure.  These symptoms may represent a serious problem that is an emergency. Do not wait to see if the symptoms will go away. Get medical help right away. Call your local emergency services (911 in the [place] Do not drive yourself to the hospital. Summary  Atrial  flutter is an abnormal heart rhythm that can give you symptoms of palpitations, shortness of breath, or fatigue.  Atrial flutter is often treated with medicines to keep your heart in a normal rhythm and to prevent a stroke.  Get help right away if you cannot catch your breath, or have chest pain or pressure.  Get help right away if you have signs or symptoms of a stroke. This information is not intended to replace advice given to you by your health care provider. Make sure you discuss any questions you have with your health care provider. Document Revised: 07/07/2018 Document Reviewed: 07/07/2018 Elsevier Patient Education  2020 Elsevier Inc.  

## 2019-02-02 ENCOUNTER — Other Ambulatory Visit: Payer: Self-pay | Admitting: *Deleted

## 2019-02-02 ENCOUNTER — Encounter: Payer: Self-pay | Admitting: *Deleted

## 2019-02-02 DIAGNOSIS — I509 Heart failure, unspecified: Secondary | ICD-10-CM | POA: Diagnosis not present

## 2019-02-02 DIAGNOSIS — I129 Hypertensive chronic kidney disease with stage 1 through stage 4 chronic kidney disease, or unspecified chronic kidney disease: Secondary | ICD-10-CM | POA: Diagnosis not present

## 2019-02-02 DIAGNOSIS — I4892 Unspecified atrial flutter: Secondary | ICD-10-CM | POA: Diagnosis not present

## 2019-02-02 DIAGNOSIS — N183 Chronic kidney disease, stage 3 unspecified: Secondary | ICD-10-CM | POA: Diagnosis not present

## 2019-02-02 DIAGNOSIS — R269 Unspecified abnormalities of gait and mobility: Secondary | ICD-10-CM | POA: Diagnosis not present

## 2019-02-02 DIAGNOSIS — E785 Hyperlipidemia, unspecified: Secondary | ICD-10-CM | POA: Diagnosis not present

## 2019-02-02 DIAGNOSIS — E1129 Type 2 diabetes mellitus with other diabetic kidney complication: Secondary | ICD-10-CM | POA: Diagnosis not present

## 2019-02-02 NOTE — Patient Outreach (Signed)
[facility]) Care Management [facility] CM Telephone Outreach, [facility] Red Alert/ General Discharge PCP office completes Transition of Care follow up post-hospital discharge Post-hospital discharge day # 4  02/02/2019  Allison Duffy 09-09-1926 [id number]  EMMI Red Alert referral received 02/01/2019/ General Discharge EMMI call date/ day:  01/31/2019/ day # 1 EMMI Red Alert reason: "No scheduled follow up"  Successful telephone outreach to Allison Duffy, 86 y/o female referred to [facility] CM 02/01/2019 for EMMI Red Alert post- hospital discharge; patient was recently hospitalized December 31- January 29, 2019 for hypoxia/ peripheral edema/ acute on chronic CHF exacerbation with A-Flutter.  Patient was discharged home to self-care without home health services in place.  Patient has history including, but not limited to, HTN/ HLD; DM-II; and combined CHF.  HIPAA/ identity verified with patient and purpose of call discussed with patient; patient confirms that she received automated EMMI call and stated that she did in fact have a post-hospital virtual office visit with her PCP office this morning; stated that she answered the automated EMMI call the way she did because the hospital staff told her that she would have home health services in place once she got home, and "this has not happened."  Review of EMR completed and I confirmed for patient that I could not verify any orders placed for home health services at time of hospital discharge.  Patient stated that her daughter mentioned to the PCP during virtual office visit this morning, and she "thinks" that PCP is following up to order, but she is not sure.  Patient stated that she lives alone and although she is independent in her care, she would like to have someone coming to check on her after recent hospitalization, as she "is not up to normal self."   [facility] CM services were discussed with patient, and patient provided verbal consent for [facility]  CM involvement in her care, and she requests that I speak with her daughter Erasmo Downer, who is her HCPOA to follow up on home health services, as patient states she "sometimes gets a little confused" around specific details/ information, and "doesn't want to get anything mixed up."  Patient provides verbal consent to speak with daughter/ caregiver [name] "any time" and provides her phone number as [phone]; care coordination outreach completed with patient's daughter by phone, upon completion of call with patient, and daughter's feedback included in note today.  Today, patient/ caregiver reports "doing okay after hospital visit, but weak;" she denies pain and new/ recent falls and sounds to be in no distress throughout entirety of phone call today.  Patient further reports:  Medications: -- Has all medicationsand takes as prescribed;denies questions/ concerns around current medications, but states that she "takes a LOT" of medications  -- self-manage medications using weekly pill planner box, with assistance as indicated from daughter/ caregiver. -- denies issues with swallowing medications -- declines medication review today, stating that she and daughter reviewed all with NP [place] during virtual visit this morning; agrees to medication review at time of next outreach  Provider appointments: -- All recent/ upcoming provider appointments were reviewed with patient today; daughter provides transportation to and attends with patient all provider appointments  Safety/ Mobility/ Falls: -- denies new/ recent falls-- reports no falls over last year -- assistive devices: has walker and cane but does not need to regularly use; occasionally uses "just for safety" and keeps available for use in home as needed -- general fall risks/ prevention education discussed with patient  today  Holiday representative needs: -- currently denies community resource needs, stating supportive family members  that assist with care needs as indicated -- family provides transportation for patient to all provider appointments, errands, etc -- SDOH completed for depression/ food insecurity/ transportation- no concerns identified-- however, patient's caregiver/ daughter [name] reports that she would like information on applying for Meals on Wheels for patient, as patient's family/ caregivers work and she believes this resource would be helpful for patient; discussed with caregiver that I could place [facility] CSW referral to facilitate evaluation of patient's eligibility for Meals on wheels and she agrees to this- will place [facility] CSW referral.  Advanced Directive (AD) Planning:   --reports does currently have exisisting AD in place for HCPOA and Living Will and declines desire to make changes to either post-hospital discharge  Self-health management of chronic disease state of CHF and DM- Type II: -- has just begun performing daily weights at home; monitors and records; discussed value/ purpose of daily weight monitoring with patient and caregiver and using teach back method, provided education around early vs. late signs fluid retention; discussed action plan for weight gain in setting of CHF; patient and caregiver report that during today's virtual PCP office visit, PCP instructed to take "extra" diuretic for weight gain as per weight gain guidelines; instructed patient and caregiver to notify PCP for further instruction should patient require more than 2 doses of extra diuretic in a week- explained that diuretics can cause dehydration -- reports daily weights post- recent hospital discharge as "170 lbs" consistently -- denies clinical concerns around shortness of breath outside of baseline/ peripheral swelling/ development of new or unusual cough- explained these are signs of CHF yellow zone that usually occur after weight gain noticed: discussed importance of acting on weight gain promptly once it is noted -- Reports  "good control" of blood sugars; monitors/ records twice daily; states no current concerns with blood sugars -- follows "heart healthy and low sugar/ carb/ salt diet  Patient/ caregiver deny further issues, concerns, or problems today.  I provided/ confirmed that patient/ caregiver both have my direct phone number, the main [place] office phone number, and the [facility] CM 24-hour nurse advice phone number should issues arise prior to next scheduled [place] outreach.  Encouraged patient to contact me directly if needs, questions, issues, or concerns arise prior to next scheduled outreach; patient agreed to do so.  Plan:  Patient will take medications as prescribed and will attend all scheduled provider appointments  Patient will promptly notify care providers for any new concerns/ issues/ problems that arise  Patient will actively participate in home health services once these services are in place  I will place care coordination outreach to patient's PCP, reiterating patient's interest in home health services post-hospital discharge, since these services were apparently not initiated at hospital  Patient will continue monitoring/ recording daily weights   I will make patient's PCP aware of [place] RN CM involvement in patient's care-- will send barriers letter  [place] outreach to continue with scheduled phone call in 2 weeks, for completion of medication review and [facility] CM initial assessment  [facility] CM Care Plan Problem One     Most Recent Value  Care Plan Problem One  High Risk for hospital readmission related to/ as evidenced by recent hospitalization for CHF exacerbation in elderly patient who lives alone  Role Documenting the Problem One  Care Management [place] for Problem One  Active  [facility] Long Term Goal   Over the next 31 days, patient will not experience hopsital readmission, as evidenced by patient/ caregiver reporting and review of EMR during [facility] RN CM  outreach  [facility] Long Term Goal Start Date  02/02/19  Interventions for Problem One Long Term Goal  Discussed current clinical condition with patient and placed care coordination outreach to her caregiver/ daughter,  confirmed that both have no current clinical concerns around patient's condition,  engaged patient with [facility] CM services and placed [facility] CSW referral to facilitate patient obtaining Meals on Wheels,  initiated [facility] CM program and mailed printed educational material to patient around self-health management of CHF at home  [facility] CM Short Term Goal #1   Over the next 30 days, patient will verbalize plan for initiation of home health services post-hospital discharge, as evidenced by patient and caregiver reporting and collaboration with care providers as indicated during [facility] RN CM outreach  [facility] CM Short Term Goal #1 Start Date  02/02/19  Interventions for Short Term Goal #1  Discussed with patient and caregiver that patient was told at hospital that she would have home health services in place post- hospital discharge,  confirmed that patient and caregiver discussed with PCP during office visit this morning and that they are expecting home health services to be initiated post- PCP office visit,  placed care coordination outreach to patient's PCP to follow up on home health request  [facility] CM Short Term Goal #2   Over the next 30 days, patient will continue monitoring and recording daily weights at home, as evidenced by patient and caregiver reporting during [facility] RN CM outreach  [facility] CM Short Term Goal #2 Start Date  02/02/19  Interventions for Short Term Goal #2  Confirmed that patient has just started performing daily weights at home and discussed rationale for daily weight monitoring and recording,  reiterated weight gain guidelines in setting of CHF, along with action plan for same,  placed printed educational material in mail to patient around benefit of daily weight monitoring in CHF     I appreciate the  opportunity to participate in [place] care,  Oneta Rack, RN, BSN, [employer] Coordinator [facility] Care Management  [phone]

## 2019-02-03 ENCOUNTER — Other Ambulatory Visit: Payer: Self-pay | Admitting: *Deleted

## 2019-02-03 NOTE — Patient Outreach (Signed)
[place] [facility]) Care Management [place] Telephone Outreach Care Coordination  02/03/2019  LORRE HANLY January 12, 1927 [id number]  [place] CM Telephone Outreach Care Coordination re:  Analiz Essary, 86 y/o female referred to [facility] CM 02/01/2019 for EMMI Red Alert post- hospital discharge; patient was recently hospitalized December 31- January 29, 2019 for hypoxia/ peripheral edema/ acute on chronic CHF exacerbation with A-Flutter.  Patient was discharged home to self-care without home health services in place.  Patient has history including, but not limited to, HTN/ HLD; DM-II; and combined CHF.  Contacted PCP office to reiterate that patient would like home health services initiated now that she is home from hospital; left message on voice mail for "Brianna/ Tammy" explaining that patient had virtual visit with NP [place] yesterday, and that family asked me to confirm that home health services had been ordered by PCP post- office visit yesterday.  Provided my direct call-back information should PCP team have further questions  Plan:  Will collaborate with PCP office as indicated  Oneta Rack, RN, BSN, [place] Care Management  [phone]

## 2019-02-04 ENCOUNTER — Other Ambulatory Visit: Payer: Self-pay

## 2019-02-04 NOTE — Patient Outreach (Addendum)
[place] [facility]) Care Management  02/04/2019  Allison Duffy Mar 18, 1926 [id number]   Social Work referral received from [employer].  "Please place Allison Duffy CSW referral, as patient's caregiver/daughter, Allison Duffy. Allison Duffy reports that she would like information on applying for Meals on Wheels for patient, as patient's family/caregivers work and she believes this resource would be helpful for patient; general information around available resources would be helpful, per report of daughter, as patient is 19 y/o and lives alone; family members are very supportive and live very close to patient to check on her daily. Allison Duffy requests that call be placed to her rather than patient as patient is very fatigued post- recent Duffy admission; patient provided verbal consent to speak with Allison Duffy. Allison Duffy can be reached at [phone]".   Successful outreach to patient's daughter.  Talked with her about Meals on Wheels program and she consented to referral.  Informed her that, unfortunately, program does have wait list at this time. Informed her about [facility] contract with [employer] program which will allow temporary meal delivery for patient.  Informed her that patient will receive a total of 48 already prepared meals over the next 30 days.  Referral placed to Meals on Wheels and [employer].  Talked with daughter about need for other resources at this time.  Family members are checking on patient daily to ensure needs are met.  Daughter is managing medications and providing transportation to MD appointments.  Family is not seeking additional assistance at this time but we did talk about aide services and what is typically covered by Medicare vs. Medicaid.  Informed daughter about [facility]ing through [employer] [facility] and encouraged her to utilize service if/when patient is in need of additional care.    Daughter inquired about what patient needs to do in order to get  COVID19 vaccine.  Provided her with web address for [facility] information regarding current phase for vaccination. Provided her with information on upcoming vaccination clinics through [facility] and [facility] as well as how to go about registering for clinics.    No other social work needs were identified at this time so case is being closed.  Encouraged daughter to call if additional needs arise.      Ronn Melena, BSW Social Worker [phone]

## 2019-02-08 ENCOUNTER — Ambulatory Visit: Payer: Medicare Other

## 2019-02-14 ENCOUNTER — Encounter: Payer: Self-pay | Admitting: *Deleted

## 2019-02-14 ENCOUNTER — Other Ambulatory Visit: Payer: Self-pay | Admitting: *Deleted

## 2019-02-14 NOTE — Patient Outreach (Signed)
[facility]) [place] Telephone Outreach PCP office completes Transition of Care follow up post-hospital discharge Post-hospital discharge day # 16 without unplanned re-admission  02/14/2019  NEYA CREEGAN May 08, 1926 [id number]  Successful telephone outreach to Orene Desanctis, 86 y/o female referred to [facility] CM 02/01/2019 for EMMI Red Alert post- hospital discharge; patient was recently hospitalized December 31- January 29, 2019 for hypoxia/ peripheral edema/ acute on chronic CHF exacerbation with A-Flutter.  Patient was discharged home to self-care without home health services in place.  Patient has history including, but not limited to, HTN/ HLD; DM-II; and combined CHF.  HIPAA/ identity verified with patient and caregiver/ daughter [name]; both report patient "doing great" and both deny current clinical concerns/ issues/ problems.  Patient denies pain and new/ recent falls today and she sounds to be in no distress throughout entirety of phone call today.  Patient/ caregiver further report:  -- Has all medicationsand taking all as prescribed;denies questions/ concerns around current medications.  Patient continues self-managing medication, and daughter assists by picking up medications from outpatient pharmacy, etc.  Patient was recently discharged from hospital and all medications were reviewed with caregiver and patient; medication list updated according to patient/ caregiver report  Medications Reviewed Today    Reviewed by Knox Royalty, RN (Registered Nurse) on 02/02/19 at 1049  Med List Status: <None>  Medication Order Taking? Sig Documenting Provider Last Dose Status Informant  acetaminophen (TYLENOL) 500 MG tablet [id number] No Take 1,000 mg by mouth every 6 (six) hours as needed. [provider] Past Week Unknown time Active Self  aluminum-magnesium hydroxide-simethicone (MAALOX) 073-710-62 MG/5ML SUSP [id number] No Take 30 mLs by  mouth 4 (four) times daily -  before meals and at bedtime. [provider] Past Week Unknown time Active Self  amLODipine (NORVASC) 10 MG tablet [id number] No Take 10 mg by mouth daily. [provider] Past Week Unknown time Active Self  apixaban (ELIQUIS) 2.5 MG TABS tablet [id number]  Take 1 tablet (2.5 mg total) by mouth 2 (two) times daily. Emeterio Reeve, DO  Active   ascorbic acid (VITAMIN C) 500 MG tablet [id number] No Take 500 mg by mouth daily.  [provider] Past Week Unknown time Active Self  aspirin 81 MG tablet [id number] No Take 81 mg by mouth 2 (two) times daily.  [provider] Past Week Unknown time Active Self  Calcium Carbonate (CALCIUM 600 PO) [id number] No Take 1,200 mg by mouth daily. [provider] Past Week Unknown time Active Self  cholecalciferol (VITAMIN D) 1000 UNITS tablet [id number] No Take 1,000 Units by mouth daily. [provider] Past Week Unknown time Active Self  cholestyramine (QUESTRAN) 4 g packet [id number] No Take 4 g by mouth daily. [provider] Past Week Unknown time Active Self  cloNIDine (CATAPRES) 0.1 MG tablet [id number] No Take 0.1 mg by mouth 2 (two) times daily. [provider] Past Week Unknown time Active Self  furosemide (LASIX) 40 MG tablet [id number] No Take 40 mg by mouth daily. [provider] Past Week Unknown time Active Self  furosemide (LASIX) 40 MG tablet [id number]  Take 1 tablet (40 mg total) by mouth daily. Take 1 additional tablet daily if increased swelling, shortness of breath, or weight gain 5 lbs or more in 2 days Emeterio Reeve, DO  Active   gabapentin (NEURONTIN) 100 MG capsule [id number] No Take 1 capsule (100 mg total) by mouth 3 (three) times daily. Volanda Napoleon, PA-C Past  Week Unknown time Active Self  Glucosamine HCl (GLUCOSAMINE PO) [id number] No Take 2,000 mg by mouth daily. [provider] Past Week Unknown time Active Self   lisinopril (PRINIVIL,ZESTRIL) 40 MG tablet [id number] No Take 40 mg by mouth daily. [provider] Past Week Unknown time Active Self  magnesium oxide (MAG-OX) 400 MG tablet [id number] No Take 400 mg by mouth daily. [provider] Past Week Unknown time Active Self  metFORMIN (GLUCOPHAGE) 500 MG tablet [id number] No Take 500 mg by mouth 2 (two) times daily with a meal.  [provider] Past Week Unknown time Active Self  metoprolol succinate (TOPROL-XL) 50 MG 24 hr tablet [id number] No Take 50 mg by mouth daily.  [provider] Past Week 0800 Active Self  Multiple Vitamin (MULTIVITAMIN WITH MINERALS) TABS [id number] No Take 1 tablet by mouth daily.  [provider] Past Week Unknown time Active Self  omeprazole (PRILOSEC) 40 MG capsule [id number] No Take 1 capsule (40 mg total) by mouth daily. Murlean Iba, MD Past Week Unknown time Active Self  Potassium 99 MG TABS [id number] No Take 99 mg by mouth daily. [provider] Past Week Unknown time Active Self  potassium chloride SA (KLOR-CON) 20 MEQ tablet [id number]  Take 2 tablets (40 mEq total) by mouth daily for 5 days. Emeterio Reeve, DO  Active   pravastatin (PRAVACHOL) 20 MG tablet [id number] No Take 20 mg by mouth daily. [provider] Past Week Unknown time Active Self  sitaGLIPtin (JANUVIA) 100 MG tablet [id number] No Take 100 mg by mouth daily. [provider] Past Week Unknown time Active Self  vitamin E 400 UNIT capsule [id number] No Take 400 Units by mouth daily. [provider] Past Week Unknown time Active Self         -- No recent provider appointments noted/ upcoming provider appointments were reviewed with patient/ caregiver today; daughter continues to provide transportation to and attends with patient all provider appointments; cardiology provider referral still pending post- recent PCP appointment/ referral  -- home health services were not  initiated post- recent PCP appointment; patient reports that caregiver was told by PCP office that home health agencies had denied initiation of services due to 'being overloaded with corona virus patient's."  Patient reports that she now does not believe that she needs these services, and she is able to take care of herself and complete basic housekeeping, meal preparation, etc  -- has spoken with [facility] CSW team around nutritional resources: confirms that Mom's meals are now in place; denies ongoing community resource needs  Self-health management of chronic disease state of CHF and DM- Type II: -- has continued performing daily weights at home; monitors and records; using teach back method, reiterated previously provided education around purpose of daily weight monitoring with patient and caregiver both; reviewed action plan for weight gain in setting of CHF -- discussed use of "extra" dose of diuretic; confirmed patient has not needed extra dose post- recent hospital discharge; discussed dosing/ scheduling considerations if she were to ever have to take the extra dose, as instructed during recent PCP office visit -- reports daily weights post- recent hospital discharge as "171- 174 lbs" consistently; denies weight gain > 3 pounds overnight since hospital discharge -- encouraged patient/ caregiver to take all post-hospital recorded weights at home to cardiology provider once patient has appointment: discussed value of asking about "goal weight" for patient when active with cardiology provider -- denies clinical concerns around shortness of breath outside  of baseline/ peripheral swelling/ development of new or unusual cough- reiterated previously provided education around signs of CHF yellow zone that usually occur after weight gain noticed: reiterated importance of acting on weight gain promptly once it is noted -- Reports ongoing "good control" of blood sugars; continues to monitor/ records twice daily;  states no current concerns with blood sugars -- continues to follow heart healthy and low sugar/ carb/ salt diet  Patient/ caregiver deny further issues, concerns, or problems today. I provided/ confirmed that patient/ caregiver both havemy direct phone number, the main [place] office phone number, and the [facility] CM 24-hour nurse advice phone number should issues arise prior to next scheduled [place] outreach in 3 weeks, around patient's/ caregiver preference.  Encouraged patient and caregiver to contact me directly if needs, questions, issues, or concerns arise prior to next scheduled outreach; both agreed to do so.  Plan:  Patient will take medications as prescribed and will attend all scheduled provider appointments  Patient will promptly notify care providers for any new concerns/ issues/ problems that arise  Patient will continue monitoring/ recording daily weights   Will share today's note/ updated medication list/ care plan with patient's PCP as [facility] RN CM initial assessment  [facility] CM outreach to continue with scheduled phone call in 3 weeks, or sooner if indicated  [facility] CM Care Plan Problem One     Most Recent Value  Care Plan Problem One  High Risk for hospital readmission related to/ as evidenced by recent hospitalization for CHF exacerbation in elderly patient who lives alone  Role Documenting the Problem One  Care Management Coordinator  Care Plan for Problem One  Active  [facility] Long Term Goal   Over the next 31 days, patient will not experience hopsital readmission, as evidenced by patient/ caregiver reporting and review of EMR during [facility] RN CM outreach  [facility] Long Term Goal Start Date  02/02/19  Interventions for Problem One Long Term Goal  Discussed current clinical condition with patient and caregiver,  confirmed that neitrher have current clinical concerns,  confirmed that patient/ caregiver have no concerns aorund medications- medication review completed and medication  list updated in EMR and sent to patient's PCP with [facility] RN CM initial assessment  [facility] CM Short Term Goal #1   Over the next 30 days, patient will verbalize plan for initiation of home health services post-hospital discharge, as evidenced by patient and caregiver reporting and collaboration with care providers as indicated during [facility] RN CM outreach  [facility] CM Short Term Goal #1 Start Date  02/02/19  [facility] CM Short Term Goal #1 Met Date  02/14/19 [Goal Met]  Interventions for Short Term Goal #1  Confirmed that patient/ careiver heard back from PCP, who informed that no home health agencies were available due to corona virus staffing challenges,  confirmed that patient and caregiver do not now believe that patient needs home health services  [facility] CM Short Term Goal #2   Over the next 30 days, patient will continue monitoring and recording daily weights at home, as evidenced by patient and caregiver reporting during [facility] RN CM outreach  [facility] CM Short Term Goal #2 Start Date  02/02/19  Interventions for Short Term Goal #2  Confirmed that patient has continues monitoring and recordiong dailyt weight at home and reviewed all recent weights with patient and caregiver,  using teachback method, reviewed previously provided education with patient and caregiver around weight gain guidelines and corresponding action plan for  weight gain/ signs/ symptoms yellow CHF zones,  discussed considerations around taking "extra" dose diuretic, should patient need to take in the future     Oneta Rack, RN, BSN, [employer] Coordinator [facility]  [phone]

## 2019-02-18 ENCOUNTER — Ambulatory Visit: Payer: Medicare Other | Attending: Internal Medicine

## 2019-02-18 DIAGNOSIS — Z23 Encounter for immunization: Secondary | ICD-10-CM | POA: Insufficient documentation

## 2019-02-22 ENCOUNTER — Encounter: Payer: Self-pay | Admitting: General Practice

## 2019-02-24 ENCOUNTER — Telehealth: Payer: Self-pay | Admitting: Cardiovascular Disease

## 2019-02-24 NOTE — Telephone Encounter (Signed)
NOTED IN APPT NOTES DAUGHTER ACCOMPANYING PT .Adonis Housekeeper

## 2019-02-24 NOTE — Telephone Encounter (Signed)
Patient has an appt with Dr. Gwenlyn Found on 03/01/19. She will need her daughter, [name], to come with her to the appt. She states that she has several medical conditions and has a hard time retaining information.

## 2019-03-01 ENCOUNTER — Other Ambulatory Visit: Payer: Self-pay

## 2019-03-01 ENCOUNTER — Ambulatory Visit (INDEPENDENT_AMBULATORY_CARE_PROVIDER_SITE_OTHER): Payer: Medicare Other | Admitting: Cardiovascular Disease

## 2019-03-01 ENCOUNTER — Encounter: Payer: Self-pay | Admitting: Cardiovascular Disease

## 2019-03-01 DIAGNOSIS — I10 Essential (primary) hypertension: Secondary | ICD-10-CM | POA: Diagnosis not present

## 2019-03-01 DIAGNOSIS — I4892 Unspecified atrial flutter: Secondary | ICD-10-CM

## 2019-03-01 DIAGNOSIS — I5043 Acute on chronic combined systolic (congestive) and diastolic (congestive) heart failure: Secondary | ICD-10-CM | POA: Diagnosis not present

## 2019-03-01 DIAGNOSIS — I340 Nonrheumatic mitral (valve) insufficiency: Secondary | ICD-10-CM | POA: Diagnosis not present

## 2019-03-01 DIAGNOSIS — E782 Mixed hyperlipidemia: Secondary | ICD-10-CM

## 2019-03-01 NOTE — Assessment & Plan Note (Signed)
History of hyperlipidemia on Questran with lipid profile performed 11/15/2018 revealing total cholesterol 163, LDL of 97 and HDL 46.

## 2019-03-01 NOTE — Patient Instructions (Signed)
Medication Instructions:  Your physician recommends that you continue on your current medications as directed. Please refer to the Current Medication list given to you today.  If you need a refill on your cardiac medications before your next appointment, please call your pharmacy.   Lab work: NONE  Testing/Procedures: NONE  Follow-Up: At [facility], you and your health needs are our priority.  As part of our continuing mission to provide you with exceptional heart care, we have created designated Provider Care Teams.  These Care Teams include your primary Cardiologist (physician) and Advanced Practice Providers (APPs -  Physician Assistants and Nurse Practitioners) who all work together to provide you with the care you need, when you need it. You may see Dr. Gwenlyn Found or one of the following Advanced Practice Providers on your designated Care Team:    Kerin Ransom, PA-C  [place], [place]  Coletta Memos,   Your physician wants you to follow-up in: 3 months with a Physicians Assistant Your physician wants you to follow-up in: 6 months with Dr. Gwenlyn Found

## 2019-03-01 NOTE — Progress Notes (Signed)
03/01/2019 Allison Duffy   October 04, 1926  [id number]  Primary Physician Burnard Bunting, MD Primary Cardiologist: Lorretta Harp MD [name], [place]  HPI:  Allison Duffy is a 86 y.o.  mildly overweight widowed Caucasian female mother of 3 living daughters (one deceased), grandmother a grandchildren. Her daughter [name] accompanies her today who I have seen this patient remotely as well.  I last saw her in the office 07/05/2013.  She was referred by Dr. Reynaldo Minium for cardiovascular evaluation because of new onset effort angina or shortness of breath and fatigue.her cardiac risk factor profile is notable for treated hypertension, diabetes and hyperlipidemia. She does not smoke. There is no family history. She has never had a heart attack or stroke. Over the last 6 months she's noticed new onset effort angina, dyspnea and fatigue. The symptoms do not occur at rest. They do not awaken her from sleep she had a 2D echo performed 07/20/2013 with cyst which was essentially normal as was a Myoview stress test back in.  I have not seen her for the last 6 years.  She was admitted to the hospital 01/27/2019 with combined systolic and diastolic heart failure, atrial fibrillation.  She was diuresed, rate controlled and placed on oral anticoagulation.  She still complains of some dyspnea on exertion.  She weighs herself on a daily basis and avoid salt.   Current Meds  Medication Sig  . acetaminophen (TYLENOL) 500 MG tablet Take 1,000 mg by mouth every 6 (six) hours as needed.  Marland Kitchen aluminum-magnesium hydroxide-simethicone (MAALOX) [id number] MG/5ML SUSP Take 30 mLs by mouth 4 (four) times daily -  before meals and at bedtime.  Marland Kitchen amLODipine (NORVASC) 10 MG tablet Take 10 mg by mouth daily.  Marland Kitchen apixaban (ELIQUIS) 2.5 MG TABS tablet Take 1 tablet (2.5 mg total) by mouth 2 (two) times daily.  Marland Kitchen ascorbic acid (VITAMIN C) 500 MG tablet Take 500 mg by mouth daily.   Marland Kitchen aspirin 81 MG tablet Take 81 mg by mouth 2  (two) times daily.   . carvedilol (COREG) 12.5 MG tablet Take 12.5 mg by mouth 2 (two) times daily with a meal.  . cholecalciferol (VITAMIN D) 1000 UNITS tablet Take 1,000 Units by mouth daily.  . cholestyramine (QUESTRAN) 4 g packet Take 4 g by mouth daily.  . cloNIDine (CATAPRES) 0.1 MG tablet Take 0.1 mg by mouth 2 (two) times daily.  . furosemide (LASIX) 40 MG tablet Take 1 tablet (40 mg total) by mouth daily. Take 1 additional tablet daily if increased swelling, shortness of breath, or weight gain 5 lbs or more in 2 days  . glipiZIDE (GLUCOTROL) 10 MG tablet Take 10 mg by mouth daily before breakfast.  . Glucosamine HCl (GLUCOSAMINE PO) Take 2,000 mg by mouth daily.  Marland Kitchen lisinopril (PRINIVIL,ZESTRIL) 40 MG tablet Take 40 mg by mouth daily.  . magnesium oxide (MAG-OX) 400 MG tablet Take 400 mg by mouth daily.  . metFORMIN (GLUCOPHAGE) 500 MG tablet Take 500 mg by mouth 2 (two) times daily with a meal.   . metoprolol succinate (TOPROL-XL) 50 MG 24 hr tablet Take 50 mg by mouth daily.   . Multiple Vitamin (MULTIVITAMIN WITH MINERALS) TABS Take 1 tablet by mouth daily.   Marland Kitchen olmesartan (BENICAR) 40 MG tablet Take 40 mg by mouth daily.  Marland Kitchen omeprazole (PRILOSEC) 40 MG capsule Take 1 capsule (40 mg total) by mouth daily.  . potassium chloride SA (KLOR-CON) 20 MEQ tablet Take 2 tablets (40  mEq total) by mouth daily for 5 days.  . pravastatin (PRAVACHOL) 20 MG tablet Take 20 mg by mouth daily.  . Probiotic Product (PRO-BIOTIC BLEND) CAPS Take by mouth. Takes OTC probiotic  . sitaGLIPtin (JANUVIA) 100 MG tablet Take 100 mg by mouth daily.  . Turmeric (QC TUMERIC COMPLEX PO) Take by mouth. Patient reports takes OTC as needed  . vitamin E 400 UNIT capsule Take 400 Units by mouth daily.  . White Petrolatum-Mineral Oil (ARTIFICIAL TEARS) ointment Place into both eyes as needed.     Allergies  Allergen Reactions  . Codeine Nausea And Vomiting  . Hydrocodone     Nausea and vomiting  . Sulfa Antibiotics  Itching and Nausea Only    Social History   Socioeconomic History  . Marital status: Widowed    Spouse name: Not on file  . Number of children: Not on file  . Years of education: Not on file  . Highest education level: Not on file  Occupational History  . Not on file  Tobacco Use  . Smoking status: Former Smoker    Years: 5.00    Types: Cigarettes    Quit date: 07/05/1953    Years since quitting: 65.6  . Smokeless tobacco: Never Used  Substance and Sexual Activity  . Alcohol use: No  . Drug use: No  . Sexual activity: Never  Other Topics Concern  . Not on file  Social History Narrative  . Not on file   Social Determinants of Health   Financial Resource Strain:   . Difficulty of Paying Living Expenses: Not on file  Food Insecurity: No Food Insecurity  . Worried About Charity fundraiser in the Last Year: Never true  . Ran Out of Food in the Last Year: Never true  Transportation Needs: No Transportation Needs  . Lack of Transportation (Medical): No  . Lack of Transportation (Non-Medical): No  Physical Activity:   . Days of Exercise per Week: Not on file  . Minutes of Exercise per Session: Not on file  Stress:   . Feeling of Stress : Not on file  Social Connections:   . Frequency of Communication with Friends and Family: Not on file  . Frequency of Social Gatherings with Friends and Family: Not on file  . Attends Religious Services: Not on file  . Active Member of Clubs or Organizations: Not on file  . Attends Archivist Meetings: Not on file  . Marital Status: Not on file  Intimate Partner Violence:   . Fear of Current or Ex-Partner: Not on file  . Emotionally Abused: Not on file  . Physically Abused: Not on file  . Sexually Abused: Not on file     Review of Systems: General: negative for chills, fever, night sweats or weight changes.  Cardiovascular: negative for chest pain, dyspnea on exertion, edema, orthopnea, palpitations, paroxysmal nocturnal  dyspnea or shortness of breath Dermatological: negative for rash Respiratory: negative for cough or wheezing Urologic: negative for hematuria Abdominal: negative for nausea, vomiting, diarrhea, bright red blood per rectum, melena, or hematemesis Neurologic: negative for visual changes, syncope, or dizziness All other systems reviewed and are otherwise negative except as noted above.    Blood pressure (!) 210/90, pulse 80, height 5\' 6"  (1.676 m), weight 176 lb 9.6 oz (80.1 kg), SpO2 96 %.  General appearance: alert and no distress Neck: no adenopathy, no carotid bruit, no JVD, supple, symmetrical, trachea midline and thyroid not enlarged, symmetric, no tenderness/mass/nodules Lungs:  clear to auscultation bilaterally Heart: irregularly irregular rhythm Extremities: Trace bilateral lower extremity edema Pulses: 2+ and symmetric Skin: Skin color, texture, turgor normal. No rashes or lesions Neurologic: Alert and oriented X 3, normal strength and tone. Normal symmetric reflexes. Normal coordination and gait  EKG not performed today  ASSESSMENT AND PLAN:   Essential hypertension History of essential hypertension with blood pressure measured today at 192/96.  She is on amlodipine 10 mg carvedilol 12.5 mg p.o. twice daily, lisinopril 40 mg a day and metoprolol.  Hyperlipidemia History of hyperlipidemia on Questran with lipid profile performed 11/15/2018 revealing total cholesterol 163, LDL of 97 and HDL 46.  Atrial flutter by electrocardiogram [facility]) History of atrial fibrillation recently recognized during her admission on New Year's eve rate controlled on Eliquis oral anticoagulation.  Acute on chronic combined systolic and diastolic CHF (congestive heart failure) ([place]) Recent admissions to the hospital at the end of last year for heart failure and A. fib.  Her echo showed normal LV systolic function with moderate to severe mitral regurgitation.  Mitral regurgitation Moderate to severe  MR by 2D echo performed 01/28/2019.  Patient is not a candidate for any kind of reconstructive valve repair      Lorretta Harp MD [facility], [facility] 03/01/2019 3:51 PM

## 2019-03-01 NOTE — Assessment & Plan Note (Signed)
History of atrial fibrillation recently recognized during her admission on New Year's eve rate controlled on Eliquis oral anticoagulation.

## 2019-03-01 NOTE — Assessment & Plan Note (Signed)
History of essential hypertension with blood pressure measured today at 192/96.  She is on amlodipine 10 mg carvedilol 12.5 mg p.o. twice daily, lisinopril 40 mg a day and metoprolol.

## 2019-03-01 NOTE — Assessment & Plan Note (Signed)
Moderate to severe MR by 2D echo performed 01/28/2019.  Patient is not a candidate for any kind of reconstructive valve repair

## 2019-03-01 NOTE — Assessment & Plan Note (Signed)
Recent admissions to the hospital at the end of last year for heart failure and A. fib.  Her echo showed normal LV systolic function with moderate to severe mitral regurgitation.

## 2019-03-07 ENCOUNTER — Other Ambulatory Visit: Payer: Self-pay | Admitting: *Deleted

## 2019-03-07 ENCOUNTER — Encounter: Payer: Self-pay | Admitting: *Deleted

## 2019-03-07 DIAGNOSIS — I509 Heart failure, unspecified: Secondary | ICD-10-CM | POA: Diagnosis not present

## 2019-03-07 DIAGNOSIS — I4892 Unspecified atrial flutter: Secondary | ICD-10-CM | POA: Diagnosis not present

## 2019-03-07 DIAGNOSIS — I340 Nonrheumatic mitral (valve) insufficiency: Secondary | ICD-10-CM | POA: Diagnosis not present

## 2019-03-07 DIAGNOSIS — E1129 Type 2 diabetes mellitus with other diabetic kidney complication: Secondary | ICD-10-CM | POA: Diagnosis not present

## 2019-03-07 DIAGNOSIS — I129 Hypertensive chronic kidney disease with stage 1 through stage 4 chronic kidney disease, or unspecified chronic kidney disease: Secondary | ICD-10-CM | POA: Diagnosis not present

## 2019-03-07 DIAGNOSIS — E785 Hyperlipidemia, unspecified: Secondary | ICD-10-CM | POA: Diagnosis not present

## 2019-03-07 DIAGNOSIS — E213 Hyperparathyroidism, unspecified: Secondary | ICD-10-CM | POA: Diagnosis not present

## 2019-03-07 DIAGNOSIS — M1990 Unspecified osteoarthritis, unspecified site: Secondary | ICD-10-CM | POA: Diagnosis not present

## 2019-03-07 DIAGNOSIS — Z1331 Encounter for screening for depression: Secondary | ICD-10-CM | POA: Diagnosis not present

## 2019-03-07 DIAGNOSIS — N183 Chronic kidney disease, stage 3 unspecified: Secondary | ICD-10-CM | POA: Diagnosis not present

## 2019-03-07 DIAGNOSIS — I10 Essential (primary) hypertension: Secondary | ICD-10-CM | POA: Diagnosis not present

## 2019-03-07 NOTE — Patient Outreach (Signed)
[address] [facility]) Care Management [place] Telephone Outreach Post-hospital discharge day # 37 without unplanned hospital re-admission  03/07/2019  ICEL CASTLES 30-Aug-1926 [id number]  Successful telephone outreach to Orene Desanctis, 86 y/o female referred to [facility] CM 02/01/2019 for EMMI [place] post- hospital discharge; patient was recently hospitalized December 31- January 29, 2019 for hypoxia/ peripheral edema/ acute on chronic CHF exacerbation with A-Flutter. Patient was discharged home to self-care without home health services in place. Patient has history including, but not limited to, HTN/ HLD; DM-II; and combined CHF.  HIPAA/ identity verified with patient who reports "doing very well;" patient denies pain, new/ recent falls and denies clinical concerns.  Reports continues living alone, independently and states "going fine," as daughter checks in with her daily/ regularly.  She sounds to be in no distress throughout phone call today.  Patient further reports:  -- attended cardiology provider appointment 03/01/19- reports visit went well; denies questions upon review of visit -- has virtual PCP office visit this morning, 03/07/19 -- obtained first coronavirus vaccine in late January- to obtain second dose this week; verbalizes plans to attend appointment; daughter will provide transportation -- no concerns around medications; patient continues self-managing following written instructions her daughter has posted in her home; confirms that she has not needed "extra" dose of diuretic for weight gain -- continues monitoring and recording daily weights at home: reports consistent daily weight ranges between 172-174 lbs; denies weight gain > 3 lbs overnight/ 5 lbs in one week; using teach back method, reiterated previously provided education around weight gain guidelines in setting of CHF, signs/ symptoms yellow CHF zone, and corresponding action plan ---- reports has noticed  (L) lower extremity swelling "only in the afternoons" which "goes away by the next morning," reports she elevates feet in evenings, and "this seems to take care of it." ---- denies increased shortness of breath outside of baseline, with activity, states "no problems" around breathing -- continues monitoring and recording blood sugars twice daily; denies concerns, states "in normal ranges" ---- reviewed with patient blood sugars and A1-C trends: patient reports recent blood sugars at home between 160-180/ fasting and 113-160/ post-prandial; patient able to verbalize good understanding of significance of A1-C value and is able to verbalize accurate understanding of A1-C trends over last year- reports "at goal" of "less than 7" (most recent A1-C noted at 6.6 on 01/28/19) ---- reviewed with patient her diet and she confirms that while she "eats healthy" she does not follow a strict DM diet, stating that when her blood sugars are low, she "feels terrible;" positive reinforcement provided that she has maintained her A1-C within her goal of "less than 7;" confirmed that patient has not had any recent low blood sugars/ hypoglycemic episodes  Patient deniesfurther issues, concerns, or problems today. Iprovided/confirmed that patient/ caregiver bothhavemy direct phone number, the main [facility] CM office phone number, and the [facility] CM 24-hour nurse advice phone number should issues arise prior to next scheduled [place] outreach next month. Encouraged patient and her daughter/ caregiver to contact me directly if needs, questions, issues, or concerns arise prior to next scheduled outreach; both agreed to do so.  Plan:  Patient will take medications as prescribed and will attend all scheduled provider appointments  Patient will promptly notify care providers for any new concerns/ issues/ problems that arise  Patient will continue monitoring/ recording daily weights and blood sugars  [facility] CM  outreach to continue with scheduled phone callnext month, sooner  if indicated  [facility] CM Care Plan Problem One     Most Recent Value  Care Plan Problem One  High Risk for hospital readmission related to/ as evidenced by recent hospitalization for CHF exacerbation in elderly patient who lives alone  Role Documenting the Problem One  Care Management Coordinator  Care Plan for Problem One  Not Active  [facility] Long Term Goal   Over the next 31 days, patient will not experience hopsital readmission, as evidenced by patient/ caregiver reporting and review of EMR during [facility] RN CM outreach  [facility] Long Term Goal Start Date  02/02/19  [facility] Long Term Goal Met Date  03/07/19 [facility] Met]  Interventions for Problem One Long Term Goal  Discussed with patient her current clinical condition and confirmed that she has no current clinical concerns,  reviewed recent and upcoming provider appointments with patient and confirmed that she has ongoing reliable transportation through family members and that she has resumed driving short distances  [facility] CM Short Term Goal #2   Over the next 30 days, patient will continue monitoring and recording daily weights at home, as evidenced by patient and caregiver reporting during [facility] RN CM outreach  [facility] CM Short Term Goal #2 Start Date  02/02/19  [facility] CM Short Term Goal #2 Met Date  03/07/19 [Goal Met]  Interventions for Short Term Goal #2  Confirmed that patient continues monitoring and recording daily weights at home,  reviewed with patient recent weights at home and using teach back method, reiterated previously provided education around weight gain guidelines in setting of CHF,  signs/ symptoms yellow CHF zone and corresponding action plan    [facility] CM Care Plan Problem Two     Most Recent Value  Care Plan Problem Two  Self-health management of chronic disease states of CHF and DM, as evidenced by patient reporting  Role Documenting the Problem Two  Care Management Coordinator  Care Plan  for Problem Two  Active  Interventions for Problem Two Long Term Goal   Using teachback method, reiterated previously provided education around self-health management of CHF, as above,  iniated discussion around patient's self-health management of chronic disease state of DM and reviewed with patient recent blood sugars at home and A1-C trends over last year,  confirmed that patient understands significance of A1-C value  [facility]N Long Term Goal  Over the next 60 days, patient will continue monitoring and recording her daily weights and blood sugars at home, as evidenced by patient reporting and review of same during [facility] RN CM outreach  [facility] Long Term Goal Start Date  03/07/19     Oneta Rack, RN, BSN, [place] Care Management  [phone]

## 2019-03-10 ENCOUNTER — Ambulatory Visit: Payer: Medicare Other | Attending: Internal Medicine

## 2019-03-10 DIAGNOSIS — Z23 Encounter for immunization: Secondary | ICD-10-CM | POA: Insufficient documentation

## 2019-03-10 NOTE — Progress Notes (Signed)
   Covid-19 Vaccination Clinic  Name:  Allison Duffy    MRN: [id number] DOB: July 11, 1926  03/10/2019  [name] was observed post Covid-19 immunization for 15 minutes without incidence. She was provided with Vaccine Information Sheet and instruction to access the V-Safe system.   [name] was instructed to call 911 with any severe reactions post vaccine: Marland Kitchen Difficulty breathing  . Swelling of your face and throat  . A fast heartbeat  . A bad rash all over your body  . Dizziness and weakness

## 2019-03-31 ENCOUNTER — Other Ambulatory Visit: Payer: Self-pay | Admitting: Cardiovascular Disease

## 2019-03-31 NOTE — Telephone Encounter (Signed)
*  STAT* If patient is at the pharmacy, call can be transferred to refill team.   1. Which medications need to be refilled? (please list name of each medication and dose if known)  apixaban (ELIQUIS) 2.5 MG TABS tablet  2. Which pharmacy/location (including street and city if local pharmacy) is medication to be sent to  [place], [place] [address] [place]  3. Do they need a 30 day or 90 day supply? 30 with refills  Pt was prescribed this while in the hospital and is having a hard time getting a refill from the hospital doctor.

## 2019-04-01 ENCOUNTER — Other Ambulatory Visit: Payer: Self-pay | Admitting: Pharmacist Clinician (PhC)/ Clinical Pharmacy Specialist

## 2019-04-01 MED ORDER — APIXABAN 2.5 MG PO TABS: 2.5000 mg | ORAL_TABLET | Freq: Two times a day (BID) | ORAL | 1 refills | Status: DC

## 2019-04-01 NOTE — Telephone Encounter (Signed)
90 F 80.1 kg, SCr 0.91 (1/21), LOV Gwenlyn Found 2/21

## 2019-04-04 ENCOUNTER — Encounter: Payer: Self-pay | Admitting: *Deleted

## 2019-04-04 ENCOUNTER — Other Ambulatory Visit: Payer: Self-pay | Admitting: *Deleted

## 2019-04-04 NOTE — Patient Outreach (Signed)
[place] [facility]) Care Management [place] Telephone Outreach Post-hospital discharge day # 65 without hospital re-admission  04/04/2019  LYNDON FISS 17-Jan-1927 [id number]  Successful telephone outreach to Orene Desanctis, 86 y/o female referred to [facility] CM 02/01/2019 for EMMI Red Alert post- hospital discharge; patient was recently hospitalized December 31- January 29, 2019 for hypoxia/ peripheral edema/ acute on chronic CHF exacerbation with A-Flutter. Patient was discharged home to self-care without home health services in place. Patient has history including, but not limited to, HTN/ HLD; DM-II; and combined CHF.  HIPAA/ identity verified with patient who reports "doing great;" patient denies pain, new/ recent falls and denies clinical concerns.  Reports continues living alone, independently and states "going fine," as daughter checks in with her daily/ regularly and continues to assist with medication management, although patient manages medications independently on a daily basis.  She denies concerns. Issues/ problems around medications and she sounds to be in no distress throughout phone call today.  Patient further reports:  -- no recent provider appointments; has completed all doses of corona virus vaccine and tolerated well -- has not needed to initiate action plan for CHF signs/ symptoms yellow zone: has not needed to use "extra" dose of diuretic for weight gain/ swelling, shortness of breath; able to independently verbalize action plan for signs/ symptoms CHF yellow zone -- reviewed with patient recent weights recorded at home:  Patient reports consistent daily weights between 171-174 lbs -- continues monitoring and recording blood sugars at home BID; reports all values in normal range, between 155-175; reports fasting blood sugar this morning of 156; denies recent low blood sugars -- continues driving self short distances; family continues checking in with/  assisting as indicated regularly -- reviewed with patient signs/ symptoms concerning for MI/ CVA, along with corresponding action plan- patient verbalizes a good general understanding of same   Patient deniesfurther issues, concerns, or problems today. Iconfirmed that patient/ caregiver bothhavemy direct phone number, the main [facility] CM office phone number, and the [facility] CM 24-hour nurse advice phone number should issues arise prior to next scheduled [facility] CM outreachnext month. Discussed that patient has thus far made great progress in meeting her previously established [facility] CM goals; discussed possibility of eventual transfer to [place] in future, and patient is agreeable to this  Encouraged patientand her daughter/ caregiverto contact me directly if needs, questions, issues, or concerns arise prior to next scheduled outreach; bothagreed to do so.  Plan:  Patient will take medications as prescribed and will attend all scheduled provider appointments  Patient will promptly notify care providers for any new concerns/ issues/ problems that arise  Patient will continue monitoring/ recording daily weights and blood sugars  [facility]N CM outreachto continue with scheduled phone callnext month,sooner if indicated  [facility] CM Care Plan Problem Two     Most Recent Value  Care Plan Problem Two  Self-health management of chronic disease states of CHF and DM, as evidenced by patient reporting  Role Documenting the Problem Two  Care Management Coordinator  Care Plan for Problem Two  Active  Interventions for Problem Two Long Term Goal   Discussed current clnical condition with patient and confirmed that she continues monitoring and recording daily weights and blood sugars at home- all reviewed with patient today,  reiterated previously provided education around self-health management strategies for CHF/ DM at home and confirmed that patient continues following previously established  action plan for concerns/ changes.  Confirmed that patient  has had no recent changes or concerns around medications, and continues self-managing with her daughter/ caregiver's supservision  [facility]N Long Term Goal  Over the next 60 days, patient will continue monitoring and recording her daily weights and blood sugars at home, as evidenced by patient reporting and review of same during [facility] RN CM outreach  [facility] Long Term Goal Start Date  03/07/19     Oneta Rack, RN, BSN, [place] Coordinator [facility] Care Management  [phone]

## 2019-05-03 ENCOUNTER — Other Ambulatory Visit: Payer: Self-pay | Admitting: *Deleted

## 2019-05-03 ENCOUNTER — Encounter: Payer: Self-pay | Admitting: *Deleted

## 2019-05-03 NOTE — Patient Outreach (Signed)
[place] [facility]) Care Management [place] Telephone Outreach Post-hospital discharge day # 94 without hospital readmission  05/03/2019  Allison Duffy October 08, 1926 [id number]  Successful telephone outreach to Allison Duffy, 86 y/o female referred to [facility] CM 02/01/2019 for EMMI Red Alert post- hospital discharge; patient was recently hospitalized December 31- January 29, 2019 for hypoxia/ peripheral edema/ acute on chronic CHF exacerbation with A-Flutter. Patient was discharged home to self-care without home health services in place. Patient has history including, but not limited to, HTN/ HLD; DM-II; and combined CHF.  HIPAA/ identity verified with patientwho reports "doing just fine;" patient denies pain, new/ recent falls and denies clinical concerns. Reports continues living alone, independently and states "going fine," as daughter checks in with her daily/ regularly and continues to assist with medication management (pill box filling); patient manages medications independently on a daily basis once pill box is filled. She denies concerns. Issues/ problems around medications and she sounds to be in no distress throughout phone call today.  Patient further reports:  -- has not needed to initiate action plan for CHF signs/ symptoms yellow zone: has not needed to use "extra" dose of diuretic for weight gain/ swelling, shortness of breath; continues able to independently verbalize action plan for signs/ symptoms CHF yellow zone -- reviewed with patient recent weights recorded at home:  Patient reports consistent daily weights between 173-175 lbs -- continues monitoring and recording blood sugars at home BID; reports all values in "usual" range, between 150-160; reports fasting blood sugar this morning of 150; denies recent low blood sugars; reports adherent to low carb/ low salt diet; reports occasionally eats "sweet treats" but rarely; moderation was encouraged and patient  verbalizes understanding/ agreement stating that she uses restraint any time she "splurges" -- has scheduled PCP office visit in May 2021; daughter to provide transportation; expects next due A1-C to be drawn at time of upcoming PCP visit; reviewed with patient recent A1-C trends and patient verbalizes a good understanding of the significance of the A1-C and reports she is at goal, with A1-C of 6.6 from 01/28/19 (reports goal as "less than 7") -- continues driving self short distances; family continues checking in with/ assisting as indicated regularly; able to complete household chores independently and local daughter assists as indicated; continues to deny community resource needs -- denies recent changes to medications; denies medication concerns   Patientdeniesfurther issues, concerns, or problems today. Again discussed with patient that she has thus far made great progress in meeting her previously established [facility] CM goals; discussed possibility of transfer to [place] for ongoing support/ education around self-health management of CHF/ DM and patient is agreeable to this-- will place referral and notify patient's PCP of same.  She confirms that she has no ongoing care coordination/ care management needs.  Confirmed that patient hasmy direct phone number, the main [facility] CM office phone number, and the [facility] CM 24-hour nurse advice phone number should issues arise prior to outreach from [place] month. Encouraged patientandher daughter/caregiverto contact me directly if needs, questions, issues, or concerns arise prior to [place]; she verbalizes understanding and agreement.  Plan:  Patient will continue monitoring/ recording daily weightsand blood sugars and will engage with [place]  I will close [facility] CM case, as patient has successfully met her previously established goals and denies ongoing care coordination/ care management needs, and will make  patient's PCP aware of same- will quarterly update/ discipline closure  letter to PCP as [place] [facility] CM Care Plan Problem Two     Most Recent Value  Care Plan Problem Two  Self-health management of chronic disease states of CHF and DM, as evidenced by patient reporting  Role Documenting the Problem Two  Care Management [place] for Problem Two  Active  Interventions for Problem Two Long Term Goal   Discussed with patient her current clinical condition and confirmed that she does not have any current clinical concerns,  reviewed daily weights and blood sugars with patient and confirmed that she continues to monitor both daily,  confirmed that patient is able to independently verbalize signs/ symptoms amd corresponding action plan for yellow CHF zone,  confirmed that she continues taking medications as prescribed and has not needed to use extra dose of diuretic for weight gain.  Placed referral for [place] for ongoing patient support/ education  [facility] Long Term Goal  Over the next 31 days, patient will continue monitoring and recording her daily weights and blood sugars at home, as evidenced by patient reporting and review of same during [place] outreach [Goal modified today around transfer to [place] Term Goal Start Date  05/03/19 [Goal modified/ extended today]     It has been a pleasure caring for Allison Klinefelter, RN, BSN, [employer] Coordinator [facility] Care Management  [phone]

## 2019-05-18 ENCOUNTER — Telehealth: Payer: Self-pay | Admitting: Cardiovascular Disease

## 2019-05-18 NOTE — Telephone Encounter (Signed)
I spoke with patient's daughter. Patient will be in the doughnut hole soon. Insurance paperwork has recommended warfarin as an alternative. I explained to patient's daughter warfarin requires monitoring of levels. Daughter reports it would be difficult for patient to come into office for checks. They would like to continue Eliquis if possible. Daughter will contact Eliquis assistance program to see if patient will qualify.  Patient last filled prescription for 3 month supply and is not going to run out of medication anytime soon.  They will discuss with Arnold Long, NP at office visit on May 19,2021

## 2019-05-18 NOTE — Telephone Encounter (Signed)
New message   Patient's daughter wants to know if there is a substitute for Eliquis. She states that this medicine is expensive. Please call.

## 2019-05-25 ENCOUNTER — Encounter: Payer: Self-pay | Admitting: *Deleted

## 2019-05-25 ENCOUNTER — Other Ambulatory Visit: Payer: Self-pay | Admitting: *Deleted

## 2019-05-25 NOTE — Patient Outreach (Addendum)
[facility]) Care Management  [place]  05/25/2019   Allison Duffy June 30, 1926 [id number]  Social: Successful telephone outreach call to patient. HIPAA identifiers obtained. Patient lives Alone. She is independent with all ADLs and her daughter Allison Duffy assists her with IADLs. Patient reports that she does drive herself to the grocery store and explains that she maintains much of the house with her family doing the heavier chores. Patient denies having any recent falls and reports that her home environment is safe.  Allison Duffy would be able to provide care for the patient if the they were to need assistance and her grandson lives next door. Emotionally the patient is doing well at this time.  Subjective: Patient states that her CHF is currently under control. She is weighing daily with her weight staying consistently at 173-175. She admits to being fatigued but states this is her baseline and denies other worsening symptoms for CHF. She does try to remain active by doing a little and then resting but getting back up later and doing a little more. She is following a low sodium diet and nurse did educate regarding rinsing can vegetables and reading food labels. Her FBS today was 167 and patient states when she gets a high reading she will strictly follow her diabetic diet for the next several days. Her last A1c was 6.6 on 01/28/19 and patient states she does not need any diabetic education at this time. Patient states that she is having difficulty affording her apixaban and sitagliptin medications. Nurse will place a pharmacy referral for assistance and patient does request that they contact her daughter Allison Duffy at [phone]; she is listed on patient's release of information document.   Interventions: RN provided CHF education and reviewed symptoms and zones of CHF. Patient does request  additional CHF education. Patient stated that she did not have any further questions or concerns  at this time.  Encounter Medications:  Outpatient Encounter Medications as of 05/25/2019  Medication Sig Note  . acetaminophen (TYLENOL) 500 MG tablet Take 1,000 mg by mouth every 6 (six) hours as needed.   Marland Kitchen aluminum-magnesium hydroxide-simethicone (MAALOX) [id number] MG/5ML SUSP Take 30 mLs by mouth 4 (four) times daily -  before meals and at bedtime.   Marland Kitchen amLODipine (NORVASC) 10 MG tablet Take 10 mg by mouth daily.   Marland Kitchen apixaban (ELIQUIS) 2.5 MG TABS tablet Take 1 tablet (2.5 mg total) by mouth 2 (two) times daily.   Marland Kitchen ascorbic acid (VITAMIN C) 500 MG tablet Take 500 mg by mouth daily.    Marland Kitchen aspirin 81 MG tablet Take 81 mg by mouth 2 (two) times daily.    . carvedilol (COREG) 12.5 MG tablet Take 12.5 mg by mouth 2 (two) times daily with a meal.   . cholecalciferol (VITAMIN D) 1000 UNITS tablet Take 1,000 Units by mouth daily.   . cholestyramine (QUESTRAN) 4 g packet Take 4 g by mouth daily. 02/14/2019: Patient reports takes on prn basis-- reports has been taking QD for last "several" weeks  . cloNIDine (CATAPRES) 0.1 MG tablet Take 0.1 mg by mouth 2 (two) times daily.   . furosemide (LASIX) 40 MG tablet Take 1 tablet (40 mg total) by mouth daily. Take 1 additional tablet daily if increased swelling, shortness of breath, or weight gain 5 lbs or more in 2 days 02/14/2019: Patient reports has not needed extra Lasix since hospital discharge on January 29, 2019  . glipiZIDE (GLUCOTROL) 10 MG tablet Take 10 mg by mouth  daily before breakfast.   . Glucosamine HCl (GLUCOSAMINE PO) Take 2,000 mg by mouth daily.   Marland Kitchen lisinopril (PRINIVIL,ZESTRIL) 40 MG tablet Take 40 mg by mouth daily.   . magnesium oxide (MAG-OX) 400 MG tablet Take 400 mg by mouth daily.   . metFORMIN (GLUCOPHAGE) 500 MG tablet Take 500 mg by mouth 2 (two) times daily with a meal.    . metoprolol succinate (TOPROL-XL) 50 MG 24 hr tablet Take 50 mg by mouth daily.    . Multiple Vitamin (MULTIVITAMIN WITH MINERALS) TABS Take 1 tablet by mouth  daily.    Marland Kitchen olmesartan (BENICAR) 40 MG tablet Take 40 mg by mouth daily.   Marland Kitchen omeprazole (PRILOSEC) 40 MG capsule Take 1 capsule (40 mg total) by mouth daily.   . pravastatin (PRAVACHOL) 20 MG tablet Take 20 mg by mouth daily.   . Probiotic Product (PRO-BIOTIC BLEND) CAPS Take by mouth. Takes OTC probiotic   . sitaGLIPtin (JANUVIA) 100 MG tablet Take 100 mg by mouth daily.   . Turmeric (QC TUMERIC COMPLEX PO) Take by mouth. Patient reports takes OTC as needed   . vitamin E 400 UNIT capsule Take 400 Units by mouth daily.   . White Petrolatum-Mineral Oil (ARTIFICIAL TEARS) ointment Place into both eyes as needed.   . potassium chloride SA (KLOR-CON) 20 MEQ tablet Take 2 tablets (40 mEq total) by mouth daily for 5 days.    No facility-administered encounter medications on file as of 05/25/2019.    Functional Status:  In your present state of health, do you have any difficulty performing the following activities: 05/25/2019 02/02/2019  Hearing? Tempie Donning  Comment has hearing aids wears bilateral hearing aids, has worn "for years"  Vision? N N  Comment - wears glasses  Difficulty concentrating or making decisions? N N  Walking or climbing stairs? N Y  Dressing or bathing? N N  Doing errands, shopping? 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 N N  In the past six months, have you accidently leaked urine? N -  Do you have problems with loss of bowel control? Y -  Comment occassionally due to colitis -  Managing your Medications? Tempie Donning  Comment daughter helps Daughter assists as indicated  Managing your Finances? Tempie Donning  Comment daughter helps daughter assists as indicated  Housekeeping or managing your Housekeeping? N Y  Comment daughter drives pt to doctor and difficult errands/ patient drives herself to grocery store Daughter assists as indicated  Some recent data might be hidden    Fall/Depression  Screening: Fall Risk  05/25/2019 02/02/2019  Falls in the past year? 0 0  Number falls in past yr: 0 0  Comment - No falls over last year reported by patient today  Injury with Fall? 0 0  Comment - N/A - no falls reported  Risk for fall due to : Impaired balance/gait;Impaired mobility Medication side effect;Other (Comment)  Risk for fall due to: Comment - weakness post-hospitalization  Follow up Falls prevention discussed;Education provided;Falls evaluation completed Falls prevention discussed   PHQ 2/9 Scores 05/25/2019 02/02/2019  PHQ - 2 Score 0 0   [facility] CM Care Plan Problem One     Most Recent Value  Care Plan Problem One  Knowledge deficient related to CHF condition, treatment plan, and lifestyle changes as evidenced by patient's request for educational informaiton  Role Documenting the Problem One  Health Coach  Care Plan for Problem One  Active  [facility] Long Term Goal   Patient will verbalize symptoms of CHF exacerbation and discuss CHF zones within the next 90 days as evidence by patient's verbalization during an outreach call.  [facility] Long Term Goal Start Date  05/25/19  Interventions for Problem One Long Term Goal  Nurse discussed and reviewed signs and symptoms of CHF with patient, nurse reviewed heart failure zones and action plans, nurse encouraged patient to  weigh daily and record values, encourage patient to stay active daily, reviewed medications and encouraged compliance, nurse will send CHF educational packet      Plan: RN Health Coach will send introductory letters to provider and patient, will send CHF packet to patient, and will follow-up with the patient within the month of June. Patient does agree to future outreach calls.   Emelia Loron RN, BSN [place] [phone] [email or web address]

## 2019-05-30 ENCOUNTER — Ambulatory Visit: Payer: Medicare Other | Admitting: Adult Health

## 2019-06-13 ENCOUNTER — Telehealth: Payer: Self-pay | Admitting: Adult Health

## 2019-06-13 NOTE — Telephone Encounter (Signed)
Patient's daughter called stating she needs to come with her mother to her upcoming appt because she is 86 years old and can't come by herself.

## 2019-06-13 NOTE — Telephone Encounter (Signed)
The daughter has been made aware that we have received her message. She has been advised that her mother should fill out a DPR that includes her if needed.

## 2019-06-15 ENCOUNTER — Other Ambulatory Visit: Payer: Self-pay

## 2019-06-15 ENCOUNTER — Ambulatory Visit (INDEPENDENT_AMBULATORY_CARE_PROVIDER_SITE_OTHER): Payer: Medicare Other | Admitting: Adult Health

## 2019-06-15 ENCOUNTER — Encounter: Payer: Self-pay | Admitting: Adult Health

## 2019-06-15 VITALS — BP 172/84 | HR 65 | Temp 98.4°F | Ht 66.0 in | Wt 180.0 lb

## 2019-06-15 DIAGNOSIS — I480 Paroxysmal atrial fibrillation: Secondary | ICD-10-CM

## 2019-06-15 DIAGNOSIS — I5042 Chronic combined systolic (congestive) and diastolic (congestive) heart failure: Secondary | ICD-10-CM | POA: Diagnosis not present

## 2019-06-15 DIAGNOSIS — I340 Nonrheumatic mitral (valve) insufficiency: Secondary | ICD-10-CM | POA: Diagnosis not present

## 2019-06-15 DIAGNOSIS — I10 Essential (primary) hypertension: Secondary | ICD-10-CM

## 2019-06-15 MED ORDER — CLONIDINE HCL 0.1 MG PO TABS: ORAL_TABLET | ORAL | 3 refills | Status: DC

## 2019-06-15 NOTE — Patient Instructions (Addendum)
Medication Instructions:  INCREASE- Clonidine 0.2 mg(2 tablets) in the morning and 0.1 mg(1 tablet) at bedtime  *If you need a refill on your cardiac medications before your next appointment, please call your pharmacy*   Lab Work: None Ordered   Testing/Procedures: None Ordered   Follow-Up: At [facility], you and your health needs are our priority.  As part of our continuing mission to provide you with exceptional heart care, we have created designated Provider Care Teams.  These Care Teams include your primary Cardiologist (physician) and Advanced Practice Providers (APPs -  Physician Assistants and Nurse Practitioners) who all work together to provide you with the care you need, when you need it.  We recommend signing up for the patient portal called "MyChart".  Sign up information is provided on this After Visit Summary.  MyChart is used to connect with patients for Virtual Visits (Telemedicine).  Patients are able to view lab/test results, encounter notes, upcoming appointments, etc.  Non-urgent messages can be sent to your provider as well.   To learn more about what you can do with MyChart, go to [email or web address].    Your next appointment:   6 week(s)  The format for your next appointment:   In Person  Provider:   Quay Burow, MD or Jory Sims, DNP

## 2019-06-15 NOTE — Progress Notes (Signed)
Cardiology Office Note   Date:  06/15/2019   ID:  Allison Duffy, DOB Jul 30, 1926, MRN [id number]  PCP:  Burnard Bunting, MD  Cardiologist:  Avelina Laine  CC: Follow-up hypertension mitral valve regurgitation   History of Present Illness: Allison Duffy is a 86 y.o. female who presents for ongoing assessment and management of chest discomfort.  She also has some new onset dyspnea on exertion and fatigue.  She was admitted to the hospital in December 2020 with combined systolic diastolic heart failure, atrial fibrillation.  She was diuresed, rate controlled and placed on oral anticoagulation with Eliquis.    Repeat echocardiogram revealed normal LV systolic function with moderate to severe mitral regurgitation and diastolic dysfunction.  Dr. Gwenlyn Duffy noted that the patient is not a candidate for any kind of reconstructive valve repair.  Allison Duffy was last seen in the office on 03/01/2019.  She is here for 82-month follow-up.  Her daughter is allowed to come back with her to help with the appointment due to her mother's age, hard of hearing, and memory issues.  Allison Duffy comes today frustrated.  Despite her age she likes to remain active and be independent.  She sleeps on elevated head of bed in order to breathe at night, but denies any fluid retention.  She is medically compliant.  She occasionally has dyspnea on exertion but quickly recovers with rest.  She is concerned about the amount of medications that she is on.  She does not like feeling sleepy or having some dependent edema.  She admits to not adhering to a low-sodium diet always as she has friends come and visit who sometimes bring her salt laden foods such as hot dogs or fast food.  Past Medical History:  Diagnosis Date  . Acute on chronic combined systolic and diastolic CHF (congestive heart failure) ([place]) 01/28/2019  . Arthritis    "scattered joints" (07/29/2013)  . Chest pain   . Colitis   . Gout attack ?1980's  . Hyperlipidemia   .  Hypertension   . PONV (postoperative nausea and vomiting)   . Type II diabetes mellitus ([place])     Past Surgical History:  Procedure Laterality Date  . APPENDECTOMY    . CARPAL TUNNEL RELEASE Right   . CATARACT EXTRACTION W/ INTRAOCULAR LENS  IMPLANT, BILATERAL Bilateral   . CHOLECYSTECTOMY    . KNEE ARTHROSCOPY Right   . OLECRANON BURSECTOMY Left 07/29/2013   "in ER"  . PARATHYROIDECTOMY    . TONSILLECTOMY    . TOTAL SHOULDER REPLACEMENT  2017  . VAGINAL HYSTERECTOMY       Current Outpatient Medications  Medication Sig Dispense Refill  . acetaminophen (TYLENOL) 500 MG tablet Take 1,000 mg by mouth every 6 (six) hours as needed.    Marland Kitchen aluminum-magnesium hydroxide-simethicone (MAALOX) [id number] MG/5ML SUSP Take 30 mLs by mouth 4 (four) times daily -  before meals and at bedtime.    Marland Kitchen amLODipine (NORVASC) 10 MG tablet Take 10 mg by mouth daily.    Marland Kitchen apixaban (ELIQUIS) 2.5 MG TABS tablet Take 1 tablet (2.5 mg total) by mouth 2 (two) times daily. 180 tablet 1  . ascorbic acid (VITAMIN C) 500 MG tablet Take 500 mg by mouth daily.     Marland Kitchen aspirin 81 MG tablet Take 81 mg by mouth 2 (two) times daily.     . carvedilol (COREG) 12.5 MG tablet Take 12.5 mg by mouth 2 (two) times daily with a meal.    . cholecalciferol (VITAMIN  D) 1000 UNITS tablet Take 1,000 Units by mouth daily.    . cholestyramine (QUESTRAN) 4 g packet Take 4 g by mouth daily.    . cloNIDine (CATAPRES) 0.1 MG tablet Take 0.1 mg by mouth 2 (two) times daily.    . furosemide (LASIX) 40 MG tablet Take 1 tablet (40 mg total) by mouth daily. Take 1 additional tablet daily if increased swelling, shortness of breath, or weight gain 5 lbs or more in 2 days 30 tablet 0  . glipiZIDE (GLUCOTROL) 10 MG tablet Take 10 mg by mouth daily before breakfast.    . Glucosamine HCl (GLUCOSAMINE PO) Take 2,000 mg by mouth daily.    . magnesium oxide (MAG-OX) 400 MG tablet Take 400 mg by mouth daily.    . metFORMIN (GLUCOPHAGE) 500 MG tablet Take  500 mg by mouth 2 (two) times daily with a meal.     . Multiple Vitamin (MULTIVITAMIN WITH MINERALS) TABS Take 1 tablet by mouth daily.     Marland Kitchen olmesartan (BENICAR) 40 MG tablet Take 40 mg by mouth daily.    Marland Kitchen omeprazole (PRILOSEC) 40 MG capsule Take 1 capsule (40 mg total) by mouth daily.    . pravastatin (PRAVACHOL) 20 MG tablet Take 20 mg by mouth daily.    . Probiotic Product (PRO-BIOTIC BLEND) CAPS Take by mouth. Takes OTC probiotic    . sitaGLIPtin (JANUVIA) 100 MG tablet Take 100 mg by mouth daily.    . Turmeric (QC TUMERIC COMPLEX PO) Take by mouth. Patient reports takes OTC as needed    . vitamin E 400 UNIT capsule Take 400 Units by mouth daily.    . White Petrolatum-Mineral Oil (ARTIFICIAL TEARS) ointment Place into both eyes as needed.    . potassium chloride SA (KLOR-CON) 20 MEQ tablet Take 2 tablets (40 mEq total) by mouth daily for 5 days. 10 tablet 0   No current facility-administered medications for this visit.    Allergies:   Codeine, Hydrocodone, and Sulfa antibiotics    Social History:  The patient  reports that she quit smoking about 65 years ago. Her smoking use included cigarettes. She quit after 5.00 years of use. She has never used smokeless tobacco. She reports that she does not drink alcohol or use drugs.   Family History:  The patient's family history includes Diabetes in her brother and mother; Heart disease in her brother, father, and mother; Hypertension in her brother, father, and mother.    ROS: All other systems are reviewed and negative. Unless otherwise mentioned in H&P    PHYSICAL EXAM: VS:  BP (!) 172/84   Pulse 65   Temp 98.4 F (36.9 C)   Ht 5\' 6"  (1.676 m)   Wt 180 lb (81.6 kg)   SpO2 94%   BMI 29.05 kg/m  , BMI Body mass index is 29.05 kg/m. GEN: Well nourished, well developed, in no acute distress HEENT: normal Neck: no JVD, carotid bruits, or masses Cardiac: RRR; soft indistinguishable murmur, rubs, or gallops,no edema  Respiratory:   Clear to auscultation bilaterally, normal work of breathing, mild crackles at the bases without cough. GI: soft, nontender, nondistended, + BS MS: no deformity or atrophy Skin: warm and dry, no rash Neuro:  Strength and sensation are intact Psych: euthymic mood, full affect   EKG: Not completed this office visit  Recent Labs: 01/27/2019: ALT 25; B Natriuretic Peptide 194.9 01/28/2019: Hemoglobin 12.2; Platelets 244 01/29/2019: BUN 18; Creatinine, Ser 0.91; Potassium 3.5; Sodium 138  Lipid Panel No results Duffy for: CHOL, TRIG, HDL, CHOLHDL, VLDL, LDLCALC, LDLDIRECT    Wt Readings from Last 3 Encounters:  06/15/19 180 lb (81.6 kg)  03/01/19 176 lb 9.6 oz (80.1 kg)  01/29/19 171 lb 11.8 oz (77.9 kg)      Other studies Reviewed: Echocardiogram 02-18-19  1. Left ventricular ejection fraction, by visual estimation, is 60 to  65%. The left ventricle has normal function. There is mildly increased  left ventricular hypertrophy.  2. Left ventricular diastolic function could not be evaluated.  3. The left ventricle has no regional wall motion abnormalities.  4. Global right ventricle has normal systolic function.The right  ventricular size is normal. No increase in right ventricular wall  thickness.  5. Left atrial size was normal.  6. Right atrial size was normal.  7. Presence of pericardial fat pad.  8. Trivial pericardial effusion is present.  9. The mitral valve is normal in structure. Moderate to severe mitral  valve regurgitation.  10. The tricuspid valve is normal in structure.  11. The aortic valve is tricuspid. Aortic valve regurgitation is not  visualized.  12. The pulmonic valve was normal in structure. Pulmonic valve  regurgitation is trivial.  13. Mildly elevated pulmonary artery systolic pressure.  14. The atrial septum is grossly normal.    ASSESSMENT AND PLAN:  1.  Hypertension: Currently not well controlled.  I have rechecked her blood pressure in  the exam room and Duffy it to be 168/78.  I think this is multifactorial due to age, salt restriction nonadherence, some white coat syndrome.  I have gone over each one of her medications with her and her daughter and explained some of the side effects related to them.  She does have some sleepiness sometimes with the clonidine.  However her blood pressure is not being well controlled on current dose.  I will increase her clonidine to 0.2 mg during the day and keep her at 0.1 mg at nighttime.  If she has excessive daytime sleepiness we may be able to switch it but I do not know how much better her blood pressure control during the day would be with this change.  For now we will change her to a 0.2 mg a.m. and 0.1 mg p.m.  I discussed with her low-sodium diet and given her a copy of "salty 6" so that she could be more aware of salt and do her best to avoid eating some of the foods that her friends are bringing her or eating out as often.  She will keep up with her blood pressures at home, bring them with her on follow-up visit so that we can see how her blood pressure is doing away from the office.  May consider ambulatory blood pressure monitor if necessary.  2.  Severe mitral valve regurgitation: She is not a candidate for mitral valve repair.  This is beginning to limit her activity with some shortness of breath.  I suggested that if her breathing status worsens we may have to do a walking O2 sat on her to evaluate whether or not she would be a candidate for oxygen support.  For now we will wait until she becomes more symptomatic but she is aware that this is a possibility in order to help her to feel better.  3.  PAF: Heart rate is currently well controlled.  Would not want to go up on carvedilol as this may decrease her heart rate more so, but will also wash  it with increased dose of clonidine.  Continue Eliquis as directed.  4. Chronic diastolic heart failure: Remains on Lasix 40 mg daily.  I have  advised her to take an additional 40 mg should her breathing worsen especially after eating salty foods.  She verbalizes understanding.  She is to weigh her self daily and call us for any significant weight gain to avoid hospitalization for decompensated CHF.   Current medicines are reviewed at length with the patient today.  I have spent 45 minutes dedicated to the care of this patient on the date of this encounter to include pre-visit review of records, assessment, management and diagnostic testing,with shared decision making.  Labs/ tests ordered today include: None Phill Myron. West Pugh, ANP, AACC   06/15/2019 2:49 PM    [facility] [place] [address] Office [phone] Fax [phone]  Notice: This dictation was prepared with Dragon dictation along with smaller phrase technology. Any transcriptional errors that result from this process are unintentional and may not be corrected upon review.

## 2019-06-22 DIAGNOSIS — E1129 Type 2 diabetes mellitus with other diabetic kidney complication: Secondary | ICD-10-CM | POA: Diagnosis not present

## 2019-06-22 DIAGNOSIS — E213 Hyperparathyroidism, unspecified: Secondary | ICD-10-CM | POA: Diagnosis not present

## 2019-06-22 DIAGNOSIS — N183 Chronic kidney disease, stage 3 unspecified: Secondary | ICD-10-CM | POA: Diagnosis not present

## 2019-06-22 DIAGNOSIS — Z7901 Long term (current) use of anticoagulants: Secondary | ICD-10-CM | POA: Diagnosis not present

## 2019-06-22 DIAGNOSIS — M1990 Unspecified osteoarthritis, unspecified site: Secondary | ICD-10-CM | POA: Diagnosis not present

## 2019-06-22 DIAGNOSIS — R269 Unspecified abnormalities of gait and mobility: Secondary | ICD-10-CM | POA: Diagnosis not present

## 2019-06-22 DIAGNOSIS — J309 Allergic rhinitis, unspecified: Secondary | ICD-10-CM | POA: Diagnosis not present

## 2019-06-22 DIAGNOSIS — I130 Hypertensive heart and chronic kidney disease with heart failure and stage 1 through stage 4 chronic kidney disease, or unspecified chronic kidney disease: Secondary | ICD-10-CM | POA: Diagnosis not present

## 2019-06-22 DIAGNOSIS — I509 Heart failure, unspecified: Secondary | ICD-10-CM | POA: Diagnosis not present

## 2019-06-29 ENCOUNTER — Other Ambulatory Visit: Payer: Self-pay

## 2019-06-29 NOTE — Telephone Encounter (Signed)
Pt requesting refill for eliquis 2.5mg  but based on dosing guidelines but qualifies for the 5mg . Will route to the pharmd pool for further review.  29f, 81.6kg, scr0.91 completed 01/29/19, lovw/[place] 06/15/19

## 2019-07-01 ENCOUNTER — Other Ambulatory Visit: Payer: Self-pay | Admitting: Pharmacist Clinician (PhC)/ Clinical Pharmacy Specialist

## 2019-07-01 MED ORDER — APIXABAN 5 MG PO TABS: 5.0000 mg | ORAL_TABLET | Freq: Two times a day (BID) | ORAL | 5 refills | Status: DC

## 2019-07-08 ENCOUNTER — Other Ambulatory Visit: Payer: Self-pay | Admitting: *Deleted

## 2019-07-08 NOTE — Patient Outreach (Signed)
[facility]) Care Management  [place]  07/08/2019   Allison Duffy 01-31-1926 [id number]   Subjective: Successful telephone outreach call to patient. HIPAA identifiers obtained. Patient reports that she is feeling good. Patient explains that she feels like her CHF is at baseline stating that her SOB, edema, and fatigue have remained about the same. Patient states that she is weighing daily and recording the data with her weight ranges of 174-180. When asked if her weight gain is 3 pounds in 1 day or 5 pounds in a week; patient reports no that this is a long standing range of which the gain is gradual and she attributes the variation to her eating too much. She did verbalize that she would call the doctor if she had rapid weight gain. Patient states she is doing her best to limit salt and she stays active by maintaining her home. Reporting that she is not sedentary and does not sit for long periods at a time. She has an upcoming visit on 07/27/19 to see her cardiologist. Patient explained that she is in the doughnut hole and would like assistance with paying for Eliquis and Januvia. Nurse placed a previous pharmacy referral for this request and will follow-up as the patient did request that pharmacy reach out to her daughter Allison Duffy. Patient did not have any other questions or concerns.   Encounter Medications:  Outpatient Encounter Medications as of 07/08/2019  Medication Sig  . acetaminophen (TYLENOL) 500 MG tablet Take 1,000 mg by mouth every 6 (six) hours as needed.  Marland Kitchen aluminum-magnesium hydroxide-simethicone (MAALOX) 440-102-72 MG/5ML SUSP Take 30 mLs by mouth 4 (four) times daily -  before meals and at bedtime.  Marland Kitchen amLODipine (NORVASC) 10 MG tablet Take 10 mg by mouth daily.  Marland Kitchen apixaban (ELIQUIS) 5 MG TABS tablet Take 1 tablet (5 mg total) by mouth 2 (two) times daily.  Marland Kitchen ascorbic acid (VITAMIN C) 500 MG tablet Take 500 mg by mouth daily.   Marland Kitchen aspirin 81 MG tablet Take  81 mg by mouth 2 (two) times daily.   . carvedilol (COREG) 12.5 MG tablet Take 12.5 mg by mouth 2 (two) times daily with a meal.  . cholecalciferol (VITAMIN D) 1000 UNITS tablet Take 1,000 Units by mouth daily.  . cholestyramine (QUESTRAN) 4 g packet Take 4 g by mouth daily.  . cloNIDine (CATAPRES) 0.1 MG tablet Take 2 tablets(0.2 mg) in the morning and 1 tablet(0.1 mg) at bedtime  . furosemide (LASIX) 40 MG tablet Take 1 tablet (40 mg total) by mouth daily. Take 1 additional tablet daily if increased swelling, shortness of breath, or weight gain 5 lbs or more in 2 days  . glipiZIDE (GLUCOTROL) 10 MG tablet Take 10 mg by mouth daily before breakfast.  . Glucosamine HCl (GLUCOSAMINE PO) Take 2,000 mg by mouth daily.  . magnesium oxide (MAG-OX) 400 MG tablet Take 400 mg by mouth daily.  . metFORMIN (GLUCOPHAGE) 500 MG tablet Take 500 mg by mouth 2 (two) times daily with a meal.   . Multiple Vitamin (MULTIVITAMIN WITH MINERALS) TABS Take 1 tablet by mouth daily.   Marland Kitchen olmesartan (BENICAR) 40 MG tablet Take 40 mg by mouth daily.  Marland Kitchen omeprazole (PRILOSEC) 40 MG capsule Take 1 capsule (40 mg total) by mouth daily.  . potassium chloride SA (KLOR-CON) 20 MEQ tablet Take 2 tablets (40 mEq total) by mouth daily for 5 days.  . pravastatin (PRAVACHOL) 20 MG tablet Take 20 mg by mouth daily.  Marland Kitchen  Probiotic Product (PRO-BIOTIC BLEND) CAPS Take by mouth. Takes OTC probiotic  . sitaGLIPtin (JANUVIA) 100 MG tablet Take 100 mg by mouth daily.  . Turmeric (QC TUMERIC COMPLEX PO) Take by mouth. Patient reports takes OTC as needed  . vitamin E 400 UNIT capsule Take 400 Units by mouth daily.  . White Petrolatum-Mineral Oil (ARTIFICIAL TEARS) ointment Place into both eyes as needed.   No facility-administered encounter medications on file as of 07/08/2019.    Functional Status:  In your present state of health, do you have any difficulty performing the following activities: 05/25/2019 02/02/2019  Hearing? Allison Duffy  Comment  has hearing aids wears bilateral hearing aids, has worn "for years"  Vision? N N  Comment - wears glasses  Difficulty concentrating or making decisions? N N  Walking or climbing stairs? N Y  Dressing or bathing? N N  Doing errands, shopping? 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 N N  In the past six months, have you accidently leaked urine? N -  Do you have problems with loss of bowel control? Y -  Comment occassionally due to colitis -  Managing your Medications? Allison Duffy  Comment daughter helps Daughter assists as indicated  Managing your Finances? Allison Duffy  Comment daughter helps daughter assists as indicated  Housekeeping or managing your Housekeeping? N Y  Comment daughter drives pt to doctor and difficult errands/ patient drives herself to grocery store Daughter assists as indicated  Some recent data might be hidden    Fall/Depression Screening: Fall Risk  07/08/2019 05/25/2019 02/02/2019  Falls in the past year? 0 0 0  Number falls in past yr: 0 0 0  Comment - - No falls over last year reported by patient today  Injury with Fall? 0 0 0  Comment - - N/A - no falls reported  Risk for fall due to : Impaired mobility Impaired balance/gait;Impaired mobility Medication side effect;Other (Comment)  Risk for fall due to: Comment - - weakness post-hospitalization  Follow up Falls prevention discussed;Education provided;Falls evaluation completed Falls prevention discussed;Education provided;Falls evaluation completed Falls prevention discussed   PHQ 2/9 Scores 05/25/2019 02/02/2019  PHQ - 2 Score 0 0   [facility] CM Care Plan Problem One     Most Recent Value  Care Plan Problem One Knowledge deficient related to CHF condition, treatment plan, and lifestyle changes as evidenced by patient's request for educational informaiton  Role Documenting the Problem One [place] for Problem One Active   [facility] Long Term Goal  Patient will verbalize symptoms of CHF exacerbation and discuss CHF zones within the next 90 days as evidence by patient's verbalization during an outreach call.  [facility] Long Term Goal Start Date 05/25/19  Interventions for Problem One Long Term Goal Nurse encouraged patient to weigh daily and record values, encourage patient to stay physically  active as tolerated daily, reviewed medications and encouraged medication and low sodium diet adherence, nurse discussed and reviewed signs and symptoms of CHF with patient, nurse reviewed heart failure zones and action plans and the importance of contacting provider when in the yellow zone, nurse will resend CHF packet to patient.      Plan: Nurse will reach out to pharmacy to inquire about the status of medication assistance, will send patient CHF packet, will call patient within the month of August, and patient agrees to future outreach calls.  Emelia Loron RN, BSN [place] [phone] [email or web address]

## 2019-07-11 ENCOUNTER — Ambulatory Visit: Payer: Self-pay | Admitting: *Deleted

## 2019-07-17 ENCOUNTER — Other Ambulatory Visit: Payer: Self-pay | Admitting: Osteopathic Medicine

## 2019-07-25 ENCOUNTER — Telehealth: Payer: Self-pay | Admitting: Cardiovascular Disease

## 2019-07-25 NOTE — Telephone Encounter (Signed)
New Message:   Daughter called, she wouldl like to come in with pt for her appt on Wednesday with Dr Gwenlyn Found please.

## 2019-07-25 NOTE — Telephone Encounter (Signed)
Left the pts daughter a message to call the office back and request to speak with a triage nurse, for further assistance with accompanying the pt to her appt with Dr. Gwenlyn Found on 6/30 at 1100.

## 2019-07-27 ENCOUNTER — Other Ambulatory Visit: Payer: Self-pay

## 2019-07-27 ENCOUNTER — Ambulatory Visit (INDEPENDENT_AMBULATORY_CARE_PROVIDER_SITE_OTHER): Payer: Medicare Other | Admitting: Cardiovascular Disease

## 2019-07-27 ENCOUNTER — Encounter: Payer: Self-pay | Admitting: Cardiovascular Disease

## 2019-07-27 DIAGNOSIS — I340 Nonrheumatic mitral (valve) insufficiency: Secondary | ICD-10-CM | POA: Diagnosis not present

## 2019-07-27 DIAGNOSIS — E782 Mixed hyperlipidemia: Secondary | ICD-10-CM

## 2019-07-27 DIAGNOSIS — I10 Essential (primary) hypertension: Secondary | ICD-10-CM

## 2019-07-27 DIAGNOSIS — I259 Chronic ischemic heart disease, unspecified: Secondary | ICD-10-CM | POA: Diagnosis not present

## 2019-07-27 DIAGNOSIS — I4892 Unspecified atrial flutter: Secondary | ICD-10-CM

## 2019-07-27 NOTE — Assessment & Plan Note (Signed)
History of moderate to severe mitral regurgitation seen on 2D echocardiogram during her admission in December for heart failure.  Her EF was preserved and she had moderate to severe mitral regurgitation.  We discussed this at great length.  Do not feel she is a candidate for invasive mitral valve repair.  She is minimally limited by this with regards to her activity of daily living and have decided not to pursue MitraClip.

## 2019-07-27 NOTE — Assessment & Plan Note (Signed)
History of hyperlipidemia on statin therapy with lipid profile performed 11/15/2018 revealing total cholesterol 163, LDL 97 and HDL 46.

## 2019-07-27 NOTE — Patient Instructions (Signed)
Medication Instructions:  NO CHANGE *If you need a refill on your cardiac medications before your next appointment, please call your pharmacy*   Lab Work: If you have labs (blood work) drawn today and your tests are completely normal, you will receive your results only by: . MyChart Message (if you have MyChart) OR . A paper copy in the mail If you have any lab test that is abnormal or we need to change your treatment, we will call you to review the results.   Follow-Up: At [facility], you and your health needs are our priority.  As part of our continuing mission to provide you with exceptional heart care, we have created designated Provider Care Teams.  These Care Teams include your primary Cardiologist (physician) and Advanced Practice Providers (APPs -  Physician Assistants and Nurse Practitioners) who all work together to provide you with the care you need, when you need it.  We recommend signing up for the patient portal called "MyChart".  Sign up information is provided on this After Visit Summary.  MyChart is used to connect with patients for Virtual Visits (Telemedicine).  Patients are able to view lab/test results, encounter notes, upcoming appointments, etc.  Non-urgent messages can be sent to your provider as well.   To learn more about what you can do with MyChart, go to [email or web address].    Your next appointment:    Your physician wants you to follow-up in: 6 MONTHS WITH KATHRYN LAWRENCE DNP You will receive a reminder letter in the mail two months in advance. If you don't receive a letter, please call our office to schedule the follow-up appointment.   Your physician wants you to follow-up in: ONE YEAR WITH DR BERRY You will receive a reminder letter in the mail two months in advance. If you don't receive a letter, please call our office to schedule the follow-up appointment.  

## 2019-07-27 NOTE — Assessment & Plan Note (Signed)
History of A. fib on Eliquis oral anticoagulation.

## 2019-07-27 NOTE — Progress Notes (Signed)
07/27/2019 Allison Duffy   1926-02-16  [id number]  Primary Physician Burnard Bunting, MD Primary Cardiologist: Lorretta Harp MD [name], [place]  HPI:  Allison Duffy is a 86 y.o.  mildly overweight widowed Caucasian female mother of 3 living daughters (one deceased), grandmother a grandchildren. Her daughter [name] accompanies her today who I have seen this patient remotely as well.  I last saw her in the office 03/01/2019.  She was referred by Dr. Reynaldo Minium for cardiovascular evaluation because of new onset effort angina or shortness of breath and fatigue.her cardiac risk factor profile is notable for treated hypertension, diabetes and hyperlipidemia. She does not smoke. There is no family history. She has never had a heart attack or stroke. Over the last 6 months she's noticed new onset effort angina, dyspnea and fatigue. The symptoms do not occur at rest. They do not awaken her from sleep she had a 2D echo performed 07/20/2013 with cyst which was essentially normal as was a Myoview stress test back in.   She was admitted to the hospital 01/27/2019 with combined systolic and diastolic heart failure, atrial fibrillation.  She was diuresed, rate controlled and placed on oral anticoagulation.  She still complains of some dyspnea on exertion.  She weighs herself on a daily basis and avoid salt.  2D echo was performed that showed preserved LV function with moderate to severe mitral vegetation.  Since I saw her 4 months ago she has seen Jory Sims, NP last month.  She performs her activities of daily living with minimal limitation.  She does live alone.  She still drives short distances and does her daily housework.  She gets occasionally short of breath but not so much that it affects her quality of life.   No outpatient medications have been marked as taking for the 07/27/19 encounter (Office Visit) with Lorretta Harp, MD.     Allergies  Allergen Reactions  . Codeine  Nausea And Vomiting  . Hydrocodone     Nausea and vomiting  . Sulfa Antibiotics Itching and Nausea Only    Social History   Socioeconomic History  . Marital status: Widowed    Spouse name: 4 children and 3 living  . Number of children: 4  . Years of education: Not on file  . Highest education level: Not on file  Occupational History  . Occupation: Retired  Tobacco Use  . Smoking status: Former Smoker    Years: 5.00    Types: Cigarettes    Quit date: 07/05/1953    Years since quitting: 66.1  . Smokeless tobacco: Never Used  Vaping Use  . Vaping Use: Never used  Substance and Sexual Activity  . Alcohol use: No  . Drug use: No  . Sexual activity: Never  Other Topics Concern  . Not on file  Social History Narrative  . Not on file   Social Determinants of Health   Financial Resource Strain:   . Difficulty of Paying Living Expenses:   Food Insecurity: No Food Insecurity  . Worried About Charity fundraiser in the Last Year: Never true  . Ran Out of Food in the Last Year: Never true  Transportation Needs: No Transportation Needs  . Lack of Transportation (Medical): No  . Lack of Transportation (Non-Medical): No  Physical Activity:   . Days of Exercise per Week:   . Minutes of Exercise per Session:   Stress:   . Feeling of Stress :  Social Connections:   . Frequency of Communication with Friends and Family:   . Frequency of Social Gatherings with Friends and Family:   . Attends Religious Services:   . Active Member of Clubs or Organizations:   . Attends Archivist Meetings:   Marland Kitchen Marital Status:   Intimate Partner Violence:   . Fear of Current or Ex-Partner:   . Emotionally Abused:   Marland Kitchen Physically Abused:   . Sexually Abused:      Review of Systems: General: negative for chills, fever, night sweats or weight changes.  Cardiovascular: negative for chest pain, dyspnea on exertion, edema, orthopnea, palpitations, paroxysmal nocturnal dyspnea or shortness of  breath Dermatological: negative for rash Respiratory: negative for cough or wheezing Urologic: negative for hematuria Abdominal: negative for nausea, vomiting, diarrhea, bright red blood per rectum, melena, or hematemesis Neurologic: negative for visual changes, syncope, or dizziness All other systems reviewed and are otherwise negative except as noted above.    Blood pressure 140/66, pulse 61, height 5\' 6"  (1.676 m), weight 177 lb 3.2 oz (80.4 kg), SpO2 96 %.  General appearance: alert and no distress Neck: no adenopathy, no carotid bruit, no JVD, supple, symmetrical, trachea midline and thyroid not enlarged, symmetric, no tenderness/mass/nodules Lungs: clear to auscultation bilaterally Heart: irregularly irregular rhythm Extremities: extremities normal, atraumatic, no cyanosis or edema Pulses: 2+ and symmetric Skin: Skin color, texture, turgor normal. No rashes or lesions Neurologic: Alert and oriented X 3, normal strength and tone. Normal symmetric reflexes. Normal coordination and gait  EKG not performed today  ASSESSMENT AND PLAN:   Essential hypertension History of essential hypertension a blood pressure measured today 140/66.  She is on amlodipine, carvedilol, Catapres patch and Benicar.  Hyperlipidemia History of hyperlipidemia on statin therapy with lipid profile performed 11/15/2018 revealing total cholesterol 163, LDL 97 and HDL 46.  Chest pain History of occasional chest pain with coronary calcification seen on chest CT back in December.  This is a fairly rare phenomenon.  No further work-up at this time  Mitral regurgitation History of moderate to severe mitral regurgitation seen on 2D echocardiogram during her admission in December for heart failure.  Her EF was preserved and she had moderate to severe mitral regurgitation.  We discussed this at great length.  Do not feel she is a candidate for invasive mitral valve repair.  She is minimally limited by this with  regards to her activity of daily living and have decided not to pursue MitraClip.  Atrial flutter by electrocardiogram [facility]) History of A. fib on Eliquis oral anticoagulation.      Lorretta Harp MD FACP,FACC,FAHA, [facility] 07/27/2019 11:59 AM

## 2019-07-27 NOTE — Assessment & Plan Note (Signed)
History of essential hypertension a blood pressure measured today 140/66.  She is on amlodipine, carvedilol, Catapres patch and Benicar.

## 2019-07-27 NOTE — Assessment & Plan Note (Signed)
History of occasional chest pain with coronary calcification seen on chest CT back in December.  This is a fairly rare phenomenon.  No further work-up at this time

## 2019-09-05 ENCOUNTER — Other Ambulatory Visit: Payer: Self-pay | Admitting: *Deleted

## 2019-09-05 NOTE — Patient Outreach (Signed)
[facility] [facility]) Care Management  [place]  09/05/2019   Allison Duffy 06-20-1926 [id number]   Subjective: Successful telephone outreach call to patient. HIPAA identifiers obtained. Patient states she is doing well. Patient reports that she did get the written education regarding [facility] CHF zones and actions plans. She states that she is in the green zone today and that her weight has remained within a 3 pound range and she feels her CHF is controlled at this time. Patient explained that she does not know if [place] spoke with her daughter Allison Duffy about medication assistance. However, she continues to remain in the doughnut hole. Nurse explained that the pharmacist has call and left messages for her daughter to return their call and last that the nurse knew, pharmacy was not able to get in touch with her daughter. Nurse stated that she would reach out to pharmacy to inquire if they had spoken with her daughter and patient was appreciative. Patient reports that she is well supported by her family and that all of her needs are met. She does not have any further questions or concerns today.   Encounter Medications:  Outpatient Encounter Medications as of 09/05/2019  Medication Sig  . acetaminophen (TYLENOL) 500 MG tablet Take 1,000 mg by mouth every 6 (six) hours as needed.  Marland Kitchen aluminum-magnesium hydroxide-simethicone (MAALOX) 188-416-60 MG/5ML SUSP Take 30 mLs by mouth 4 (four) times daily -  before meals and at bedtime.  Marland Kitchen amLODipine (NORVASC) 10 MG tablet Take 10 mg by mouth daily.  Marland Kitchen apixaban (ELIQUIS) 5 MG TABS tablet Take 1 tablet (5 mg total) by mouth 2 (two) times daily.  Marland Kitchen ascorbic acid (VITAMIN C) 500 MG tablet Take 500 mg by mouth daily.   Marland Kitchen aspirin 81 MG tablet Take 81 mg by mouth 2 (two) times daily.   . carvedilol (COREG) 12.5 MG tablet Take 12.5 mg by mouth 2 (two) times daily with a meal.  . cholecalciferol (VITAMIN D) 1000 UNITS tablet Take 1,000 Units by mouth  daily.  . cholestyramine (QUESTRAN) 4 g packet Take 4 g by mouth daily.  . cloNIDine (CATAPRES) 0.1 MG tablet Take 2 tablets(0.2 mg) in the morning and 1 tablet(0.1 mg) at bedtime  . furosemide (LASIX) 40 MG tablet Take 1 tablet (40 mg total) by mouth daily. Take 1 additional tablet daily if increased swelling, shortness of breath, or weight gain 5 lbs or more in 2 days  . glipiZIDE (GLUCOTROL) 10 MG tablet Take 10 mg by mouth daily before breakfast.  . Glucosamine HCl (GLUCOSAMINE PO) Take 2,000 mg by mouth daily.  . magnesium oxide (MAG-OX) 400 MG tablet Take 400 mg by mouth daily.  . metFORMIN (GLUCOPHAGE) 500 MG tablet Take 500 mg by mouth 2 (two) times daily with a meal.   . Multiple Vitamin (MULTIVITAMIN WITH MINERALS) TABS Take 1 tablet by mouth daily.   Marland Kitchen olmesartan (BENICAR) 40 MG tablet Take 40 mg by mouth daily.  Marland Kitchen omeprazole (PRILOSEC) 40 MG capsule Take 1 capsule (40 mg total) by mouth daily.  Glory Rosebush ULTRA test strip USE AS DIRECTED TO TEST BLOOD SUGAR TWICE DAILY  . pravastatin (PRAVACHOL) 20 MG tablet Take 20 mg by mouth daily.  . Probiotic Product (PRO-BIOTIC BLEND) CAPS Take by mouth. Takes OTC probiotic  . sitaGLIPtin (JANUVIA) 100 MG tablet Take 100 mg by mouth daily.  . Turmeric (QC TUMERIC COMPLEX PO) Take by mouth. Patient reports takes OTC as needed  . vitamin E 400  UNIT capsule Take 400 Units by mouth daily.  . White Petrolatum-Mineral Oil (ARTIFICIAL TEARS) ointment Place into both eyes as needed.  . potassium chloride SA (KLOR-CON) 20 MEQ tablet Take 2 tablets (40 mEq total) by mouth daily for 5 days.   No facility-administered encounter medications on file as of 09/05/2019.    Functional Status:  In your present state of health, do you have any difficulty performing the following activities: 05/25/2019 02/02/2019  Hearing? Tempie Donning  Comment has hearing aids wears bilateral hearing aids, has worn "for years"  Vision? N N  Comment - wears glasses  Difficulty  concentrating or making decisions? N N  Walking or climbing stairs? N Y  Dressing or bathing? N N  Doing errands, shopping? 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 N N  In the past six months, have you accidently leaked urine? N -  Do you have problems with loss of bowel control? Y -  Comment occassionally due to colitis -  Managing your Medications? Tempie Donning  Comment daughter helps Daughter assists as indicated  Managing your Finances? Tempie Donning  Comment daughter helps daughter assists as indicated  Housekeeping or managing your Housekeeping? N Y  Comment daughter drives pt to doctor and difficult errands/ patient drives herself to grocery store Daughter assists as indicated  Some recent data might be hidden    Fall/Depression Screening: Fall Risk  09/05/2019 09/05/2019 07/08/2019  Falls in the past year? 0 0 0  Number falls in past yr: 0 0 0  Comment - - -  Injury with Fall? 0 0 0  Comment - - -  Risk for fall due to : Impaired mobility;Impaired balance/gait Impaired balance/gait;Impaired mobility Impaired mobility  Risk for fall due to: Comment - - -  Follow up Falls prevention discussed;Education provided;Falls evaluation completed Falls prevention discussed;Education provided;Falls evaluation completed Falls prevention discussed;Education provided;Falls evaluation completed   PHQ 2/9 Scores 05/25/2019 02/02/2019  PHQ - 2 Score 0 0   Goals Addressed            This Visit's Progress   . Patient will not have any ED visits or Hospitalizations due to CHF exacerbation within the next 90 days       CARE PLAN ENTRY (see longtitudinal plan of care for additional care plan information)   Current Barriers:  Marland Kitchen Knowledge deficit related to basic heart failure pathophysiology and self care management  Case Manager Clinical Goal(s):  Marland Kitchen Over the next 90 days, patient will verbalize understanding of  Heart Failure Action Plan and when to call doctor . Over the next 90 days patient will continue to weigh herself daily and record , will continue to take all of her medications as prescribed, and will follow the [facility] CHF Zones and action plans  Interventions:  . Provided verbal education on low sodium diet . Reviewed Heart Failure Action Plan in depth and provided written copy . Encouraged patient to continue to take her medications, weigh herself daily, and call the doctor as instructed if she is in the yellow zone . Encouraged patient to monitor her sodium intake  Patient Self Care Activities:  . Takes Heart Failure Medications as prescribed . Weighs daily and record (notifying MD of 3 lb weight gain over night or 5 lb in a week) . Verbalizes understanding of and follows CHF Action Plan . Patient reports she does try  to monitor her sodium intake most of the time         Plan: [place] will send PCP today's assessment note, will  call patient within the month of October, and patient agrees to future outreach calls.   Emelia Loron RN, BSN [place] [phone] [email or web address]

## 2019-09-27 ENCOUNTER — Other Ambulatory Visit: Payer: Self-pay

## 2019-09-27 ENCOUNTER — Ambulatory Visit (INDEPENDENT_AMBULATORY_CARE_PROVIDER_SITE_OTHER): Payer: Medicare Other | Admitting: Cardiovascular Disease

## 2019-09-27 ENCOUNTER — Encounter: Payer: Self-pay | Admitting: Cardiovascular Disease

## 2019-09-27 DIAGNOSIS — I10 Essential (primary) hypertension: Secondary | ICD-10-CM

## 2019-09-27 DIAGNOSIS — E782 Mixed hyperlipidemia: Secondary | ICD-10-CM

## 2019-09-27 DIAGNOSIS — I340 Nonrheumatic mitral (valve) insufficiency: Secondary | ICD-10-CM

## 2019-09-27 DIAGNOSIS — I4892 Unspecified atrial flutter: Secondary | ICD-10-CM

## 2019-09-27 NOTE — Progress Notes (Signed)
09/27/2019 Allison Duffy   Jan 12, 1927  [id number]  Primary Physician Burnard Bunting, MD Primary Cardiologist: Lorretta Harp MD [name], [place]  HPI:  Allison Duffy is a 86 y.o.  mildly overweight widowed Caucasian female mother of 3 living daughters (one deceased), grandmother a grandchildren. Her daughter Allison Duffy accompanies her today who I have seen as a patient remotely as well.I last saw her in the office  07/27/2019. She was referred by Dr. Reynaldo Minium for cardiovascular evaluation because of new onset effort angina or shortness of breath and fatigue.her cardiac risk factor profile is notable for treated hypertension, diabetes and hyperlipidemia. She does not smoke. There is no family history. She has never had a heart attack or stroke. Over the last 6 months she's noticed new onset effort angina, dyspnea and fatigue. The symptoms do not occur at rest. They do not awaken her from sleepshe had a 2D echo performed 07/20/2013 with cyst which was essentially normal as was a Myoview stress test back in.  She was admitted to the hospital 01/27/2019 with combined systolic and diastolic heart failure, atrial fibrillation. She was diuresed, rate controlled and placed on oral anticoagulation. She still complains of some dyspnea on exertion. She weighs herself on a daily basis and avoid salt.  2D echo was performed that showed preserved LV function with moderate to severe mitral vegetation.    She performs her activities of daily living with minimal limitation.  She does live alone.  She still drives short distances and does her daily housework.  She gets occasionally short of breath but not so much that it affects her quality of life.  Since I saw her 2 months ago she continues to do well.  She has no new complaints.  She does complain of some fatigue although her daughter says that she is very active and continues to function independently.    Current Meds  Medication Sig    . acetaminophen (TYLENOL) 500 MG tablet Take 1,000 mg by mouth every 6 (six) hours as needed.  Marland Kitchen aluminum-magnesium hydroxide-simethicone (MAALOX) [phone] MG/5ML SUSP Take 30 mLs by mouth 4 (four) times daily -  before meals and at bedtime.  Marland Kitchen amLODipine (NORVASC) 10 MG tablet Take 10 mg by mouth daily.  Marland Kitchen apixaban (ELIQUIS) 5 MG TABS tablet Take 1 tablet (5 mg total) by mouth 2 (two) times daily.  Marland Kitchen ascorbic acid (VITAMIN C) 500 MG tablet Take 500 mg by mouth daily.   Marland Kitchen aspirin 81 MG tablet Take 81 mg by mouth 2 (two) times daily.   . carvedilol (COREG) 12.5 MG tablet Take 12.5 mg by mouth 2 (two) times daily with a meal.  . cholecalciferol (VITAMIN D) 1000 UNITS tablet Take 1,000 Units by mouth daily.  . cholestyramine (QUESTRAN) 4 g packet Take 4 g by mouth daily.  . cloNIDine (CATAPRES) 0.1 MG tablet Take 2 tablets(0.2 mg) in the morning and 1 tablet(0.1 mg) at bedtime  . furosemide (LASIX) 40 MG tablet Take 1 tablet (40 mg total) by mouth daily. Take 1 additional tablet daily if increased swelling, shortness of breath, or weight gain 5 lbs or more in 2 days  . glipiZIDE (GLUCOTROL) 10 MG tablet Take 10 mg by mouth daily before breakfast.  . Glucosamine HCl (GLUCOSAMINE PO) Take 2,000 mg by mouth daily.  . magnesium oxide (MAG-OX) 400 MG tablet Take 400 mg by mouth daily.  . metFORMIN (GLUCOPHAGE) 500 MG tablet Take 500 mg by mouth 2 (two)  times daily with a meal.   . Multiple Vitamin (MULTIVITAMIN WITH MINERALS) TABS Take 1 tablet by mouth daily.   Marland Kitchen olmesartan (BENICAR) 40 MG tablet Take 40 mg by mouth daily.  Marland Kitchen omeprazole (PRILOSEC) 40 MG capsule Take 1 capsule (40 mg total) by mouth daily.  Glory Rosebush ULTRA test strip USE AS DIRECTED TO TEST BLOOD SUGAR TWICE DAILY  . potassium chloride SA (KLOR-CON) 20 MEQ tablet Take 2 tablets (40 mEq total) by mouth daily for 5 days.  . pravastatin (PRAVACHOL) 20 MG tablet Take 20 mg by mouth daily.  . Probiotic Product (PRO-BIOTIC BLEND) CAPS  Take by mouth. Takes OTC probiotic  . sitaGLIPtin (JANUVIA) 100 MG tablet Take 100 mg by mouth daily.  . Turmeric (QC TUMERIC COMPLEX PO) Take by mouth. Patient reports takes OTC as needed  . vitamin E 400 UNIT capsule Take 400 Units by mouth daily.  . White Petrolatum-Mineral Oil (ARTIFICIAL TEARS) ointment Place into both eyes as needed.     Allergies  Allergen Reactions  . Codeine Nausea And Vomiting  . Hydrocodone     Nausea and vomiting  . Sulfa Antibiotics Itching and Nausea Only    Social History   Socioeconomic History  . Marital status: Widowed    Spouse name: 4 children and 3 living  . Number of children: 4  . Years of education: Not on file  . Highest education level: Not on file  Occupational History  . Occupation: Retired  Tobacco Use  . Smoking status: Former Smoker    Years: 5.00    Types: Cigarettes    Quit date: 07/05/1953    Years since quitting: 66.2  . Smokeless tobacco: Never Used  Vaping Use  . Vaping Use: Never used  Substance and Sexual Activity  . Alcohol use: No  . Drug use: No  . Sexual activity: Never  Other Topics Concern  . Not on file  Social History Narrative  . Not on file   Social Determinants of Health   Financial Resource Strain:   . Difficulty of Paying Living Expenses: Not on file  Food Insecurity: No Food Insecurity  . Worried About Charity fundraiser in the Last Year: Never true  . Ran Out of Food in the Last Year: Never true  Transportation Needs: No Transportation Needs  . Lack of Transportation (Medical): No  . Lack of Transportation (Non-Medical): No  Physical Activity:   . Days of Exercise per Week: Not on file  . Minutes of Exercise per Session: Not on file  Stress:   . Feeling of Stress : Not on file  Social Connections:   . Frequency of Communication with Friends and Family: Not on file  . Frequency of Social Gatherings with Friends and Family: Not on file  . Attends Religious Services: Not on file  . Active  Member of Clubs or Organizations: Not on file  . Attends Archivist Meetings: Not on file  . Marital Status: Not on file  Intimate Partner Violence:   . Fear of Current or Ex-Partner: Not on file  . Emotionally Abused: Not on file  . Physically Abused: Not on file  . Sexually Abused: Not on file     Review of Systems: General: negative for chills, fever, night sweats or weight changes.  Cardiovascular: negative for chest pain, dyspnea on exertion, edema, orthopnea, palpitations, paroxysmal nocturnal dyspnea or shortness of breath Dermatological: negative for rash Respiratory: negative for cough or wheezing Urologic: negative for  hematuria Abdominal: negative for nausea, vomiting, diarrhea, bright red blood per rectum, melena, or hematemesis Neurologic: negative for visual changes, syncope, or dizziness All other systems reviewed and are otherwise negative except as noted above.    Blood pressure 134/72, pulse 63, height 5\' 6"  (1.676 m), weight 177 lb 12.8 oz (80.6 kg), SpO2 92 %.  General appearance: alert and no distress Neck: no adenopathy, no carotid bruit, no JVD, supple, symmetrical, trachea midline and thyroid not enlarged, symmetric, no tenderness/mass/nodules Lungs: clear to auscultation bilaterally Heart: regular rate and rhythm, S1, S2 normal, no murmur, click, rub or gallop Extremities: extremities normal, atraumatic, no cyanosis or edema Pulses: 2+ and symmetric Skin: Skin color, texture, turgor normal. No rashes or lesions Neurologic: Alert and oriented X 3, normal strength and tone. Normal symmetric reflexes. Normal coordination and gait  EKG atrial flutter with ventricular response of 63.  Personally reviewed this EKG.  ASSESSMENT AND PLAN:   Essential hypertension History of essential hypertension with blood pressure measured today 134/72.  She is on amlodipine and carvedilol as well as clonidine, and Benicar.  Hyperlipidemia History of hyperlipidemia  on statin therapy with lipid profile performed 11/15/2018 revealing total cholesterol 163, LDL of 97 and HDL 46.  Atrial flutter by electrocardiogram [facility]) History of atrial flutter rate controlled on Eliquis oral anticoagulation.  Mitral regurgitation History of moderate to severe mitral regurgitation on 2D echo performed 01/28/2019 with normal LV systolic function and mildly elevated pulmonary artery pressures.  At this point, she is minimally symptomatic and has no desire to have a family invasive mitral valve repair.  I concur with this.      Lorretta Harp MD FACP,FACC,FAHA, [facility] 09/27/2019 9:35 AM

## 2019-09-27 NOTE — Assessment & Plan Note (Signed)
History of hyperlipidemia on statin therapy with lipid profile performed 11/15/2018 revealing total cholesterol 163, LDL of 97 and HDL 46.

## 2019-09-27 NOTE — Assessment & Plan Note (Signed)
History of essential hypertension with blood pressure measured today 134/72.  She is on amlodipine and carvedilol as well as clonidine, and Benicar.

## 2019-09-27 NOTE — Assessment & Plan Note (Signed)
History of atrial flutter rate controlled on Eliquis oral anticoagulation.

## 2019-09-27 NOTE — Assessment & Plan Note (Signed)
History of moderate to severe mitral regurgitation on 2D echo performed 01/28/2019 with normal LV systolic function and mildly elevated pulmonary artery pressures.  At this point, she is minimally symptomatic and has no desire to have a family invasive mitral valve repair.  I concur with this.

## 2019-09-27 NOTE — Patient Instructions (Signed)
Medication Instructions:  The current medical regimen is effective;  continue present plan and medications.  *If you need a refill on your cardiac medications before your next appointment, please call your pharmacy*   Follow-Up: At [facility], you and your health needs are our priority.  As part of our continuing mission to provide you with exceptional heart care, we have created designated Provider Care Teams.  These Care Teams include your primary Cardiologist (physician) and Advanced Practice Providers (APPs -  Physician Assistants and Nurse Practitioners) who all work together to provide you with the care you need, when you need it.  We recommend signing up for the patient portal called "MyChart".  Sign up information is provided on this After Visit Summary.  MyChart is used to connect with patients for Virtual Visits (Telemedicine).  Patients are able to view lab/test results, encounter notes, upcoming appointments, etc.  Non-urgent messages can be sent to your provider as well.   To learn more about what you can do with MyChart, go to [email or web address].    Your next appointment:   Jory Sims, NP in 6 months Dr.Berry in 12 months

## 2019-10-31 ENCOUNTER — Encounter: Payer: Self-pay | Admitting: *Deleted

## 2019-10-31 ENCOUNTER — Other Ambulatory Visit: Payer: Self-pay | Admitting: *Deleted

## 2019-10-31 NOTE — Patient Outreach (Signed)
[facility]) Care Management  [place]  10/31/2019   Allison Duffy April 13, 1926 [id number]  Subjective: Successful telephone outreach call to patient. HIPAA identifiers obtained. Patient states she is doing well. Adding that her CHF remains in control. She continues to weight daily and record values and ranges have remained no more that 2-3 pounds during the week. Patient continues to follow the [facility] zone and action plan resource. Patient denies any falls and reports that her home environment is safe. She has excellent support from her daughter Allison Duffy and continues to maintain the her home inside other than heavy cleaning, she continues to drive short distances, and remains active with her church via zoom. She has an upcoming visit with her PCP where she plans to get her high does flu shot and she last saw Dr. Gwenlyn Found her cardiologist on 09/27/19 and she reports that he said he was proud of how well she was doing. Nurse gave praise to patient for maintaining her healthy and wellness. Patient did confirm that she had this nurses contact number and would call her if needed.   Encounter Medications:  Outpatient Encounter Medications as of 10/31/2019  Medication Sig  . acetaminophen (TYLENOL) 500 MG tablet Take 1,000 mg by mouth every 6 (six) hours as needed.  Marland Kitchen aluminum-magnesium hydroxide-simethicone (MAALOX) [phone] MG/5ML SUSP Take 30 mLs by mouth 4 (four) times daily -  before meals and at bedtime.  Marland Kitchen amLODipine (NORVASC) 10 MG tablet Take 10 mg by mouth daily.  Marland Kitchen apixaban (ELIQUIS) 5 MG TABS tablet Take 1 tablet (5 mg total) by mouth 2 (two) times daily.  Marland Kitchen ascorbic acid (VITAMIN C) 500 MG tablet Take 500 mg by mouth daily.   Marland Kitchen aspirin 81 MG tablet Take 81 mg by mouth 2 (two) times daily.   . carvedilol (COREG) 12.5 MG tablet Take 12.5 mg by mouth 2 (two) times daily with a meal.  . cholecalciferol (VITAMIN D) 1000 UNITS tablet Take 1,000 Units by mouth daily.  .  cholestyramine (QUESTRAN) 4 g packet Take 4 g by mouth daily.  . cloNIDine (CATAPRES) 0.1 MG tablet Take 2 tablets(0.2 mg) in the morning and 1 tablet(0.1 mg) at bedtime  . furosemide (LASIX) 40 MG tablet Take 1 tablet (40 mg total) by mouth daily. Take 1 additional tablet daily if increased swelling, shortness of breath, or weight gain 5 lbs or more in 2 days  . glipiZIDE (GLUCOTROL) 10 MG tablet Take 10 mg by mouth daily before breakfast.  . Glucosamine HCl (GLUCOSAMINE PO) Take 2,000 mg by mouth daily.  . magnesium oxide (MAG-OX) 400 MG tablet Take 400 mg by mouth daily.  . metFORMIN (GLUCOPHAGE) 500 MG tablet Take 500 mg by mouth 2 (two) times daily with a meal.   . Multiple Vitamin (MULTIVITAMIN WITH MINERALS) TABS Take 1 tablet by mouth daily.   Marland Kitchen olmesartan (BENICAR) 40 MG tablet Take 40 mg by mouth daily.  Marland Kitchen omeprazole (PRILOSEC) 40 MG capsule Take 1 capsule (40 mg total) by mouth daily.  Glory Rosebush ULTRA test strip USE AS DIRECTED TO TEST BLOOD SUGAR TWICE DAILY  . potassium chloride SA (KLOR-CON) 20 MEQ tablet Take 2 tablets (40 mEq total) by mouth daily for 5 days.  . pravastatin (PRAVACHOL) 20 MG tablet Take 20 mg by mouth daily.  . Probiotic Product (PRO-BIOTIC BLEND) CAPS Take by mouth. Takes OTC probiotic  . sitaGLIPtin (JANUVIA) 100 MG tablet Take 100 mg by mouth daily.  Marland Kitchen Turmeric (QC TUMERIC  COMPLEX PO) Take by mouth. Patient reports takes OTC as needed  . vitamin E 400 UNIT capsule Take 400 Units by mouth daily.  . White Petrolatum-Mineral Oil (ARTIFICIAL TEARS) ointment Place into both eyes as needed.   No facility-administered encounter medications on file as of 10/31/2019.    Functional Status:  In your present state of health, do you have any difficulty performing the following activities: 05/25/2019 02/02/2019  Hearing? Tempie Donning  Comment has hearing aids wears bilateral hearing aids, has worn "for years"  Vision? N N  Comment - wears glasses  Difficulty concentrating or  making decisions? N N  Walking or climbing stairs? N Y  Dressing or bathing? N N  Doing errands, shopping? 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 N N  In the past six months, have you accidently leaked urine? N -  Do you have problems with loss of bowel control? Y -  Comment occassionally due to colitis -  Managing your Medications? Tempie Donning  Comment daughter helps Daughter assists as indicated  Managing your Finances? Tempie Donning  Comment daughter helps daughter assists as indicated  Housekeeping or managing your Housekeeping? N Y  Comment daughter drives pt to doctor and difficult errands/ patient drives herself to grocery store Daughter assists as indicated  Some recent data might be hidden    Fall/Depression Screening: Fall Risk  10/31/2019 09/05/2019 09/05/2019  Falls in the past year? 0 0 0  Number falls in past yr: 0 0 0  Comment - - -  Injury with Fall? 0 0 0  Comment - - -  Risk for fall due to : Impaired mobility;Impaired balance/gait Impaired mobility;Impaired balance/gait Impaired balance/gait;Impaired mobility  Risk for fall due to: Comment - - -  Follow up Falls prevention discussed;Falls evaluation completed;Education provided Falls prevention discussed;Education provided;Falls evaluation completed Falls prevention discussed;Education provided;Falls evaluation completed   PHQ 2/9 Scores 10/31/2019 05/25/2019 02/02/2019  PHQ - 2 Score 0 0 0    Assessment:  Goals Addressed            This Visit's Progress   . Eat Healthy       Follow Up Date 01/26/20    - limit fast food meals to no more than 1 per week - manage portion size - prepare main meal at home 3 to 5 days each week - read food labels for fat, fiber, carbohydrates and portion size - reduce red meat to 2 to 3 times a week - set a realistic goal - switch to low-fat or skim milk - switch to sugar-free drinks    Why is this  important?   When you are ready to manage your nutrition or weight, having a plan and setting goals will help.  Taking small steps to change how you eat and exercise is a good place to start.    Notes:     Marland Kitchen Manage My Medicine       Follow Up Date 01/26/20    - call for medicine refill 2 or 3 days before it runs out - keep a list of all the medicines I take; vitamins and herbals too - use a pillbox to sort medicine    Why is this important?   These steps will help you keep on track with your medicines.    Notes:     . COMPLETED: Patient will not have any  ED visits or Hospitalizations due to CHF exacerbation within the next 90 days       CARE PLAN ENTRY (see longtitudinal plan of care for additional care plan information)   Current Barriers:  Marland Kitchen Knowledge deficit related to basic heart failure pathophysiology and self care management  Case Manager Clinical Goal(s):  Marland Kitchen Over the next 90 days, patient will verbalize understanding of Heart Failure Action Plan and when to call doctor . Over the next 90 days patient will continue to weigh herself daily and record , will continue to take all of her medications as prescribed, and will follow the [facility] CHF Zones and action plans  Interventions:  . Provided verbal education on low sodium diet . Reviewed Heart Failure Action Plan in depth and provided written copy . Encouraged patient to continue to take her medications, weigh herself daily, and call the doctor as instructed if she is in the yellow zone . Encouraged patient to monitor her sodium intake  Patient Self Care Activities:  . Takes Heart Failure Medications as prescribed . Weighs daily and record (notifying MD of 3 lb weight gain over night or 5 lb in a week) . Verbalizes understanding of and follows CHF Action Plan . Patient reports she does try to monitor her sodium intake most of the time  Resolved due to duplicate goals.     . Track and Manage Fluids and Swelling        Follow Up Date 01/26/20    - call office if I gain more than 2 pounds in one day or 5 pounds in one week - keep legs up while sitting - track weight in diary - use salt in moderation - watch for swelling in feet, ankles and legs every day - weigh myself daily    Why is this important?   It is important to check your weight daily and watch how much salt and liquids you have.  It will help you to manage your heart failure.    Notes:     . Track and Manage My Blood Pressure       Follow Up Date 01/26/20    - check blood pressure daily - write blood pressure results in a log or diary    Why is this important?   You won't feel high blood pressure, but it can still hurt your blood vessels.  High blood pressure can cause heart or kidney problems. It can also cause a stroke.  Making lifestyle changes like losing a little weight or eating less salt will help.  Checking your blood pressure at home and at different times of the day can help to control blood pressure.  If the doctor prescribes medicine remember to take it the way the doctor ordered.  Call the office if you cannot afford the medicine or if there are questions about it.     Notes:     . Track and Manage Symptoms       Follow Up Date 01/26/20    - bring diary to all appointments - eat more whole grains, fruits and vegetables, lean meats and healthy fats - follow rescue plan if symptoms flare-up - know when to call the doctor - track symptoms and what helps feel better or worse - dress right for the weather, hot or cold    Why is this important?   You will be able to handle your symptoms better if you keep track of them.  Making some simple changes to  your lifestyle will help.  Eating healthy is one thing you can do to take good care of yourself.    Notes: Patient does weigh daily and will call doctor as needed.      Plan: RN Health Coach will send patient Glucerna coupons, will call patient within the month of  December, and patient agrees to future outreach calls.   Emelia Loron RN, BSN [place] [phone] [email or web address]

## 2019-11-08 DIAGNOSIS — Z961 Presence of intraocular lens: Secondary | ICD-10-CM | POA: Diagnosis not present

## 2019-11-08 DIAGNOSIS — H04123 Dry eye syndrome of bilateral lacrimal glands: Secondary | ICD-10-CM | POA: Diagnosis not present

## 2019-11-08 DIAGNOSIS — H353131 Nonexudative age-related macular degeneration, bilateral, early dry stage: Secondary | ICD-10-CM | POA: Diagnosis not present

## 2019-11-08 DIAGNOSIS — H18413 Arcus senilis, bilateral: Secondary | ICD-10-CM | POA: Diagnosis not present

## 2019-11-17 DIAGNOSIS — E785 Hyperlipidemia, unspecified: Secondary | ICD-10-CM | POA: Diagnosis not present

## 2019-11-17 DIAGNOSIS — E1129 Type 2 diabetes mellitus with other diabetic kidney complication: Secondary | ICD-10-CM | POA: Diagnosis not present

## 2019-11-24 DIAGNOSIS — Z0000 Encounter for general adult medical examination without abnormal findings: Secondary | ICD-10-CM | POA: Diagnosis not present

## 2019-11-24 DIAGNOSIS — I129 Hypertensive chronic kidney disease with stage 1 through stage 4 chronic kidney disease, or unspecified chronic kidney disease: Secondary | ICD-10-CM | POA: Diagnosis not present

## 2019-11-24 DIAGNOSIS — Z23 Encounter for immunization: Secondary | ICD-10-CM | POA: Diagnosis not present

## 2019-11-24 DIAGNOSIS — N183 Chronic kidney disease, stage 3 unspecified: Secondary | ICD-10-CM | POA: Diagnosis not present

## 2019-11-24 DIAGNOSIS — E785 Hyperlipidemia, unspecified: Secondary | ICD-10-CM | POA: Diagnosis not present

## 2019-11-24 DIAGNOSIS — E669 Obesity, unspecified: Secondary | ICD-10-CM | POA: Diagnosis not present

## 2019-11-24 DIAGNOSIS — R82998 Other abnormal findings in urine: Secondary | ICD-10-CM | POA: Diagnosis not present

## 2019-11-24 DIAGNOSIS — I509 Heart failure, unspecified: Secondary | ICD-10-CM | POA: Diagnosis not present

## 2019-11-24 DIAGNOSIS — Z7901 Long term (current) use of anticoagulants: Secondary | ICD-10-CM | POA: Diagnosis not present

## 2019-11-24 DIAGNOSIS — I10 Essential (primary) hypertension: Secondary | ICD-10-CM | POA: Diagnosis not present

## 2019-11-24 DIAGNOSIS — E1129 Type 2 diabetes mellitus with other diabetic kidney complication: Secondary | ICD-10-CM | POA: Diagnosis not present

## 2019-11-28 DIAGNOSIS — Z23 Encounter for immunization: Secondary | ICD-10-CM | POA: Diagnosis not present

## 2019-12-23 ENCOUNTER — Other Ambulatory Visit: Payer: Self-pay | Admitting: Cardiovascular Disease

## 2019-12-28 ENCOUNTER — Other Ambulatory Visit: Payer: Self-pay | Admitting: *Deleted

## 2019-12-28 NOTE — Patient Outreach (Signed)
[facility]) Care Management  12/28/2019  Allison Duffy December 21, 1926 [id number]  Unsuccessful outreach attempt made to patient. RN Health Coach left HIPAA compliant voicemail message along with her contact information.  Plan: RN Health Coach will call patient within the month of January.   Emelia Loron RN, BSN [place] [phone] [name]

## 2019-12-29 ENCOUNTER — Ambulatory Visit: Payer: Self-pay | Admitting: *Deleted

## 2019-12-30 DIAGNOSIS — Z1212 Encounter for screening for malignant neoplasm of rectum: Secondary | ICD-10-CM | POA: Diagnosis not present

## 2020-01-30 ENCOUNTER — Other Ambulatory Visit: Payer: Self-pay | Admitting: *Deleted

## 2020-01-30 NOTE — Patient Outreach (Signed)
[facility]) Care Management  01/30/2020  Allison Duffy [birth year] [id number]  Unsuccessful outreach attempt made to patient. Patient answered the phone and states she is not able to speak today. She requested that this nurse call her at a later date.   Plan: RN Health Coach will call patient within the month of February.  Blanchie Serve RN, BSN [facility] Care Management  RN Health Coach [phone] [name]

## 2020-01-31 NOTE — Progress Notes (Signed)
Virtual Visit via Telephone Note   This visit type was conducted due to national recommendations for restrictions regarding the COVID-19 Pandemic (e.g. social distancing) in an effort to limit this patient's exposure and mitigate transmission in our community.  Due to her co-morbid illnesses, this patient is at least at moderate risk for complications without adequate follow up.  This format is felt to be most appropriate for this patient at this time.  The patient did not have access to video technology/had technical difficulties with video requiring transitioning to audio format only (telephone).  All issues noted in this document were discussed and addressed.  No physical exam could be performed with this format.  Please refer to the patient's chart for her  consent to telehealth for [facility]. Virtual platform was offered given ongoing worsening Covid-19 pandemic.  Date:  02/06/2020   ID:  Allison Duffy, DOB [birth year], MRN [id number]  Patient Location: Home Provider Location: [facility]  PCP:  Burnard Bunting, MD  Cardiologist:  No primary care provider on file.  Electrophysiologist:  None   Evaluation Performed:  Follow-Up Visit  Chief Complaint: follow-up of CHF and atrial fibrillation  History of Present Illness:    Allison Duffy is a 87 y.o. female with a history of chronic diastolic CHF, persistent atrial flutter on Eliquis, moderate to severe mitral regurgitation, hypertension, hyperlipidemia, and type 2 diabetes mellitus who is followed by Dr. Gwenlyn Found and presents today for routine follow-up.  Patient had a Myoview in 2015 which was normal with no evidence of ischemia. She has never had a cardiac catheterization or coronary CTA. She was admitted at the end of December 2020/beginning of January 2021 for acute diastolic CHF after presenting with progressive shortness of breath with exertion and at rest. Chest CTA at the time was negative for PE but did note moderate  aortic atherosclerosis and coronary vascular calcification. Echo showed LVEF of 60-65% with normal wall motion, mild LVH, and moderate to severe MR. She was also developed rate controlled atrial flutter after admission. She was diuresed with IV Lasix and started on Eliquis.  Patient was last seen by Dr. Gwenlyn Found in 08/2019 at which time she reported some fatigue but daughter reported that she was remaining very active and still functioning independently.   Patient presents today for follow-up via telephone visit. Patient states she has been doing well since last visit. No chest pain. She note some chronic dyspnea with activities such as sweeping the floor or vacuuming. But not shortness of breath walking around the house or at rest. She states she has slept on a wedge for the past year and this is unchanged. No PND. She notes some swelling in feet/ankles at the end of the days but reports this by the morning. She notes occasional palpitation but nothing prolonged. No abnormal bleeding in urine or stools.  BP very elevated today at 181/77. Asymptomatic with this. She had only taken her medications about 30 minutes before this. She did note BP spiked one day last week at 201/90 and she had some associated dizziness with this. However, this was an isolated event. She states BP normally better controlled.  Past Medical History:  Diagnosis Date   Acute on chronic combined systolic and diastolic CHF (congestive heart failure) ([place]) 01/28/2019   Arthritis    "scattered joints" (07/29/2013)   Chest pain    Colitis    Gout attack ?1980's   Hyperlipidemia    Hypertension    PONV (postoperative nausea  and vomiting)    Type II diabetes mellitus ([place])    Past Surgical History:  Procedure Laterality Date   APPENDECTOMY     CARPAL TUNNEL RELEASE Right    CATARACT EXTRACTION W/ INTRAOCULAR LENS  IMPLANT, BILATERAL Bilateral    CHOLECYSTECTOMY     KNEE ARTHROSCOPY Right    OLECRANON BURSECTOMY Left  07/29/2013   "in ER"   PARATHYROIDECTOMY     TONSILLECTOMY     TOTAL SHOULDER REPLACEMENT  2017   VAGINAL HYSTERECTOMY       Current Meds  Medication Sig   acetaminophen (TYLENOL) 500 MG tablet Take 1,000 mg by mouth every 6 (six) hours as needed.   aluminum-magnesium hydroxide-simethicone (MAALOX) I037812 MG/5ML SUSP Take 30 mLs by mouth 4 (four) times daily -  before meals and at bedtime.   amLODipine (NORVASC) 10 MG tablet Take 10 mg by mouth daily.   ascorbic acid (VITAMIN C) 500 MG tablet Take 500 mg by mouth daily.    carvedilol (COREG) 12.5 MG tablet Take 12.5 mg by mouth 2 (two) times daily with a meal.   cholecalciferol (VITAMIN D) 1000 UNITS tablet Take 1,000 Units by mouth daily.   cholestyramine (QUESTRAN) 4 g packet Take 4 g by mouth daily.   cloNIDine (CATAPRES) 0.1 MG tablet Take 2 tablets(0.2 mg) in the morning and 1 tablet(0.1 mg) at bedtime   ELIQUIS 5 MG TABS tablet TAKE 1 TABLET(5 MG) BY MOUTH TWICE DAILY   glipiZIDE (GLUCOTROL) 10 MG tablet Take 10 mg by mouth daily before breakfast.   Glucosamine HCl (GLUCOSAMINE PO) Take 2,000 mg by mouth daily.   magnesium oxide (MAG-OX) 400 MG tablet Take 400 mg by mouth daily.   metFORMIN (GLUCOPHAGE) 500 MG tablet Take 500 mg by mouth 2 (two) times daily with a meal.    Multiple Vitamin (MULTIVITAMIN WITH MINERALS) TABS Take 1 tablet by mouth daily.    olmesartan (BENICAR) 40 MG tablet Take 40 mg by mouth daily.   omeprazole (PRILOSEC) 40 MG capsule Take 1 capsule (40 mg total) by mouth daily.   ONETOUCH ULTRA test strip USE AS DIRECTED TO TEST BLOOD SUGAR TWICE DAILY   potassium chloride SA (KLOR-CON) 20 MEQ tablet Take 2 tablets (40 mEq total) by mouth daily for 5 days.   pravastatin (PRAVACHOL) 20 MG tablet Take 20 mg by mouth daily.   Probiotic Product (PRO-BIOTIC BLEND) CAPS Take by mouth. Takes OTC probiotic   sitaGLIPtin (JANUVIA) 100 MG tablet Take 100 mg by mouth daily.   Turmeric (QC  TUMERIC COMPLEX PO) Take by mouth. Patient reports takes OTC as needed   vitamin E 400 UNIT capsule Take 400 Units by mouth daily.   White Petrolatum-Mineral Oil (ARTIFICIAL TEARS) ointment Place into both eyes as needed.   [DISCONTINUED] aspirin 81 MG tablet Take 81 mg by mouth 2 (two) times daily.      Allergies:   Codeine, Hydrocodone, and Sulfa antibiotics   Social History   Tobacco Use   Smoking status: Former Smoker    Years: 5.00    Types: Cigarettes    Quit date: 07/05/1953    Years since quitting: 66.6   Smokeless tobacco: Never Used  Vaping Use   Vaping Use: Never used  Substance Use Topics   Alcohol use: No   Drug use: No     Family Hx: The patient's family history includes Diabetes in her brother and mother; Heart disease in her brother, father, and mother; Hypertension in her brother, father, and  mother.  ROS:   Please see the history of present illness.     Prior CV studies:    The following studies were reviewed today:   Echocardiogram 01/28/2019: Impressions: 1. Left ventricular ejection fraction, by visual estimation, is 60 to  65%. The left ventricle has normal function. There is mildly increased  left ventricular hypertrophy.  2. Left ventricular diastolic function could not be evaluated.  3. The left ventricle has no regional wall motion abnormalities.  4. Global right ventricle has normal systolic function.The right  ventricular size is normal. No increase in right ventricular wall  thickness.  5. Left atrial size was normal.  6. Right atrial size was normal.  7. Presence of pericardial fat pad.  8. Trivial pericardial effusion is present.  9. The mitral valve is normal in structure. Moderate to severe mitral  valve regurgitation.  10. The tricuspid valve is normal in structure.  11. The aortic valve is tricuspid. Aortic valve regurgitation is not  visualized.  12. The pulmonic valve was normal in structure. Pulmonic valve   regurgitation is trivial.  13. Mildly elevated pulmonary artery systolic pressure.  14. The atrial septum is grossly normal.   Labs/Other Tests and Data Reviewed:    EKG: Most recent EKG from 09/27/2019 was personally reviewed and demonstrates: Atrial flutter with ventricular rate of 63 bpm. No acute ST/T changes.   Recent Labs: No results found for requested labs within last 8760 hours.   Recent Lipid Panel No results found for: CHOL, TRIG, HDL, CHOLHDL, LDLCALC, LDLDIRECT  Wt Readings from Last 3 Encounters:  02/06/20 175 lb (79.4 kg)  09/27/19 177 lb 12.8 oz (80.6 kg)  07/27/19 177 lb 3.2 oz (80.4 kg)     Objective:    Vital Signs:  BP (!) 181/77    Pulse (!) 59    Ht 5\' 6"  (1.676 m)    Wt 175 lb (79.4 kg)    BMI 28.25 kg/m    Vital Signs Reviewed. General: No acute distress. Pulm: No labored breathing. No coughing during visit. No audible wheezing. Speaking in full sentences. Neuro: Alert and oriented. No slurred speech. Answers questions appropriately. Psych: Pleasant affect.  ASSESSMENT & PLAN:    Chronic Diastolic CHF - Echo from 01/2019 showed LVEF of 60-65% with normal wall motion. Diastolic function could not be evaluated at the time (was in atrial flutter at the time). - Unable to assess volume status over phone visit but no acute CHF symptoms. - Continue Lasix 40mg  daily. Can take an additional 40mg  as needed for weight gain or increased edema. - Discussed that low sodium diet and compressions stockings can help with ankle/edema.  Persistent Atrial Flutter - She notes occasional palpitations but nothing prolonged. - Continue Coreg 12.5mg  twice daily.  - Continue Eliquis 5mg  twice daily. She is tolerating this well with no abnormal bleeding. Will stop Aspirin to reduce bleeding risk.   Moderate to Severe Mitral Regurgitation - Echo in 01/2019 showed moderate to severe MR with peak gradient of 3.4 mmHg.  - She is minimally symptomatic.  - Patient has no desire  for invasive mitral failure repair and per Dr. last note he agrees with this given patient's advanced age.Therefore, will not order repeat Echo.   Hypertension - BP very elevated this morning at 181/77. Asymptomatic with this. She had only took her medications about 30 minutes before our call. She states it is normally better controlled. - Continue current medications: Amlodipine 10mg  daily, Coreg 12.5mg  twice  daily, Olmesartan 40mg  daily, and Clonidine 0.2mg  in the morning and 0.1mg  in the evening.  - I don't want to make any abrupt changes without knowing what her BP normally is given her age. Asked patient to keep log of BP/HR for 2 weeks and then notify us of these readings.    Hyperlipidemia - Continue Pravastatin 20mg  daily.   Type 2 Diabetes Mellitus - On Metformin, Glipizide, and Januvia at home. - Managed by PCP.  COVID-19 Education: The signs and symptoms of COVID-19 were discussed with the patient and how to seek care for testing (follow up with PCP or arrange E-visit).  The importance of social distancing was discussed today.  Time:   Today, I have spent 16 minutes with the patient with telehealth technology discussing the above problems.     Medication Adjustments/Labs and Tests Ordered: Current medicines are reviewed at length with the patient today.  Concerns regarding medicines are outlined above.   Follow Up:  Follow-up with Dr. Gwenlyn Found in 3 months.   Signed, Darreld Mclean, PA-C  02/06/2020 12:46 PM    [facility]

## 2020-02-06 ENCOUNTER — Telehealth (INDEPENDENT_AMBULATORY_CARE_PROVIDER_SITE_OTHER): Payer: Medicare Other | Admitting: Student

## 2020-02-06 ENCOUNTER — Encounter: Payer: Self-pay | Admitting: Student

## 2020-02-06 ENCOUNTER — Telehealth: Payer: Medicare Other | Admitting: Student

## 2020-02-06 VITALS — BP 181/77 | HR 59 | Ht 66.0 in | Wt 175.0 lb

## 2020-02-06 DIAGNOSIS — E785 Hyperlipidemia, unspecified: Secondary | ICD-10-CM

## 2020-02-06 DIAGNOSIS — I340 Nonrheumatic mitral (valve) insufficiency: Secondary | ICD-10-CM

## 2020-02-06 DIAGNOSIS — I10 Essential (primary) hypertension: Secondary | ICD-10-CM

## 2020-02-06 DIAGNOSIS — E118 Type 2 diabetes mellitus with unspecified complications: Secondary | ICD-10-CM | POA: Diagnosis not present

## 2020-02-06 DIAGNOSIS — I4892 Unspecified atrial flutter: Secondary | ICD-10-CM

## 2020-02-06 DIAGNOSIS — I5032 Chronic diastolic (congestive) heart failure: Secondary | ICD-10-CM

## 2020-02-06 NOTE — Patient Instructions (Signed)
Medication Instructions:  Stop Aspirin  The current medical regimen is effective;  continue present plan and medications.  *If you need a refill on your cardiac medications before your next appointment, please call your pharmacy*   Follow-Up: At [facility], you and your health needs are our priority.  As part of our continuing mission to provide you with exceptional heart care, we have created designated Provider Care Teams.  These Care Teams include your primary Cardiologist (physician) and Advanced Practice Providers (APPs -  Physician Assistants and Nurse Practitioners) who all work together to provide you with the care you need, when you need it.  We recommend signing up for the patient portal called "MyChart".  Sign up information is provided on this After Visit Summary.  MyChart is used to connect with patients for Virtual Visits (Telemedicine).  Patients are able to view lab/test results, encounter notes, upcoming appointments, etc.  Non-urgent messages can be sent to your provider as well.   To learn more about what you can do with MyChart, go to [email or web address].    Your next appointment:   3 month(s)  The format for your next appointment:   In Person  Provider:   Quay Burow, MD   Other Instructions Please check BP/HR for 2 weeks and let us know.

## 2020-03-09 ENCOUNTER — Other Ambulatory Visit: Payer: Self-pay | Admitting: *Deleted

## 2020-03-09 NOTE — Patient Outreach (Signed)
[facility]) Care Management  03/09/2020  DAVIONA HERBERT [id number]  Unsuccessful outreach attempt made to patient. RN Health Coach left HIPAA compliant voicemail message along with her contact information.  Plan: RN Health Coach will call patient within the month of March.  Emelia Loron RN, BSN [place] [phone] [email or web address]

## 2020-03-26 DIAGNOSIS — N183 Chronic kidney disease, stage 3 unspecified: Secondary | ICD-10-CM | POA: Diagnosis not present

## 2020-03-26 DIAGNOSIS — E213 Hyperparathyroidism, unspecified: Secondary | ICD-10-CM | POA: Diagnosis not present

## 2020-03-26 DIAGNOSIS — E785 Hyperlipidemia, unspecified: Secondary | ICD-10-CM | POA: Diagnosis not present

## 2020-03-26 DIAGNOSIS — E1129 Type 2 diabetes mellitus with other diabetic kidney complication: Secondary | ICD-10-CM | POA: Diagnosis not present

## 2020-03-26 DIAGNOSIS — I129 Hypertensive chronic kidney disease with stage 1 through stage 4 chronic kidney disease, or unspecified chronic kidney disease: Secondary | ICD-10-CM | POA: Diagnosis not present

## 2020-04-11 ENCOUNTER — Ambulatory Visit: Payer: Self-pay | Admitting: *Deleted

## 2020-04-11 ENCOUNTER — Emergency Department (HOSPITAL_COMMUNITY): Payer: Medicare Other

## 2020-04-11 ENCOUNTER — Inpatient Hospital Stay (HOSPITAL_COMMUNITY): Payer: Medicare Other

## 2020-04-11 ENCOUNTER — Other Ambulatory Visit: Payer: Self-pay

## 2020-04-11 ENCOUNTER — Encounter (HOSPITAL_COMMUNITY): Payer: Self-pay | Admitting: Cardiology

## 2020-04-11 ENCOUNTER — Telehealth: Payer: Self-pay | Admitting: Cardiovascular Disease

## 2020-04-11 ENCOUNTER — Inpatient Hospital Stay (HOSPITAL_COMMUNITY)
Admission: EM | Admit: 2020-04-11 | Discharge: 2020-04-13 | DRG: 287 | Disposition: A | Discharge status: Home Health | Payer: Medicare Other | Source: Ambulatory Visit | Attending: Cardiology | Admitting: Cardiology

## 2020-04-11 DIAGNOSIS — I5042 Chronic combined systolic (congestive) and diastolic (congestive) heart failure: Secondary | ICD-10-CM | POA: Diagnosis present

## 2020-04-11 DIAGNOSIS — Z7984 Long term (current) use of oral hypoglycemic drugs: Secondary | ICD-10-CM | POA: Diagnosis not present

## 2020-04-11 DIAGNOSIS — I2510 Atherosclerotic heart disease of native coronary artery without angina pectoris: Secondary | ICD-10-CM | POA: Clinically undetermined

## 2020-04-11 DIAGNOSIS — Z20822 Contact with and (suspected) exposure to covid-19: Secondary | ICD-10-CM | POA: Diagnosis not present

## 2020-04-11 DIAGNOSIS — Z7901 Long term (current) use of anticoagulants: Secondary | ICD-10-CM

## 2020-04-11 DIAGNOSIS — Z794 Long term (current) use of insulin: Secondary | ICD-10-CM | POA: Diagnosis not present

## 2020-04-11 DIAGNOSIS — I200 Unstable angina: Secondary | ICD-10-CM | POA: Diagnosis not present

## 2020-04-11 DIAGNOSIS — R0602 Shortness of breath: Secondary | ICD-10-CM | POA: Diagnosis not present

## 2020-04-11 DIAGNOSIS — I5033 Acute on chronic diastolic (congestive) heart failure: Secondary | ICD-10-CM | POA: Diagnosis present

## 2020-04-11 DIAGNOSIS — M1990 Unspecified osteoarthritis, unspecified site: Secondary | ICD-10-CM | POA: Diagnosis present

## 2020-04-11 DIAGNOSIS — I5030 Unspecified diastolic (congestive) heart failure: Secondary | ICD-10-CM | POA: Diagnosis not present

## 2020-04-11 DIAGNOSIS — Z9049 Acquired absence of other specified parts of digestive tract: Secondary | ICD-10-CM | POA: Diagnosis not present

## 2020-04-11 DIAGNOSIS — K529 Noninfective gastroenteritis and colitis, unspecified: Secondary | ICD-10-CM | POA: Diagnosis present

## 2020-04-11 DIAGNOSIS — M109 Gout, unspecified: Secondary | ICD-10-CM | POA: Diagnosis present

## 2020-04-11 DIAGNOSIS — I25110 Atherosclerotic heart disease of native coronary artery with unstable angina pectoris: Secondary | ICD-10-CM | POA: Diagnosis not present

## 2020-04-11 DIAGNOSIS — R911 Solitary pulmonary nodule: Secondary | ICD-10-CM | POA: Diagnosis present

## 2020-04-11 DIAGNOSIS — Z8249 Family history of ischemic heart disease and other diseases of the circulatory system: Secondary | ICD-10-CM

## 2020-04-11 DIAGNOSIS — E785 Hyperlipidemia, unspecified: Secondary | ICD-10-CM | POA: Diagnosis not present

## 2020-04-11 DIAGNOSIS — Z882 Allergy status to sulfonamides status: Secondary | ICD-10-CM | POA: Diagnosis not present

## 2020-04-11 DIAGNOSIS — E1169 Type 2 diabetes mellitus with other specified complication: Secondary | ICD-10-CM | POA: Diagnosis present

## 2020-04-11 DIAGNOSIS — E118 Type 2 diabetes mellitus with unspecified complications: Secondary | ICD-10-CM

## 2020-04-11 DIAGNOSIS — Z79899 Other long term (current) drug therapy: Secondary | ICD-10-CM | POA: Diagnosis not present

## 2020-04-11 DIAGNOSIS — Z87891 Personal history of nicotine dependence: Secondary | ICD-10-CM

## 2020-04-11 DIAGNOSIS — I248 Other forms of acute ischemic heart disease: Secondary | ICD-10-CM | POA: Diagnosis present

## 2020-04-11 DIAGNOSIS — I25119 Atherosclerotic heart disease of native coronary artery with unspecified angina pectoris: Secondary | ICD-10-CM | POA: Diagnosis not present

## 2020-04-11 DIAGNOSIS — R0789 Other chest pain: Secondary | ICD-10-CM | POA: Diagnosis not present

## 2020-04-11 DIAGNOSIS — I081 Rheumatic disorders of both mitral and tricuspid valves: Secondary | ICD-10-CM | POA: Diagnosis present

## 2020-04-11 DIAGNOSIS — E1159 Type 2 diabetes mellitus with other circulatory complications: Secondary | ICD-10-CM | POA: Diagnosis not present

## 2020-04-11 DIAGNOSIS — Z96611 Presence of right artificial shoulder joint: Secondary | ICD-10-CM | POA: Diagnosis present

## 2020-04-11 DIAGNOSIS — Z833 Family history of diabetes mellitus: Secondary | ICD-10-CM

## 2020-04-11 DIAGNOSIS — I110 Hypertensive heart disease with heart failure: Secondary | ICD-10-CM | POA: Diagnosis present

## 2020-04-11 DIAGNOSIS — I4892 Unspecified atrial flutter: Secondary | ICD-10-CM | POA: Diagnosis present

## 2020-04-11 DIAGNOSIS — Z885 Allergy status to narcotic agent status: Secondary | ICD-10-CM

## 2020-04-11 DIAGNOSIS — I10 Essential (primary) hypertension: Secondary | ICD-10-CM | POA: Diagnosis present

## 2020-04-11 DIAGNOSIS — R072 Precordial pain: Secondary | ICD-10-CM | POA: Diagnosis not present

## 2020-04-11 DIAGNOSIS — R931 Abnormal findings on diagnostic imaging of heart and coronary circulation: Secondary | ICD-10-CM | POA: Diagnosis not present

## 2020-04-11 DIAGNOSIS — R079 Chest pain, unspecified: Secondary | ICD-10-CM | POA: Diagnosis not present

## 2020-04-11 DIAGNOSIS — I213 ST elevation (STEMI) myocardial infarction of unspecified site: Secondary | ICD-10-CM | POA: Diagnosis not present

## 2020-04-11 DIAGNOSIS — I5032 Chronic diastolic (congestive) heart failure: Secondary | ICD-10-CM | POA: Diagnosis present

## 2020-04-11 DIAGNOSIS — I4821 Permanent atrial fibrillation: Secondary | ICD-10-CM | POA: Diagnosis not present

## 2020-04-11 LAB — CBC WITH DIFFERENTIAL/PLATELET
Abs Immature Granulocytes: 0.01 10*3/uL (ref 0.00–0.07)
Basophils Absolute: 0 10*3/uL (ref 0.0–0.1)
Basophils Relative: 1 %
Eosinophils Absolute: 0.1 10*3/uL (ref 0.0–0.5)
Eosinophils Relative: 1 %
HCT: 40.5 % (ref 36.0–46.0)
Hemoglobin: 13.7 g/dL (ref 12.0–15.0)
Immature Granulocytes: 0 %
Lymphocytes Relative: 25 %
Lymphs Abs: 1.8 10*3/uL (ref 0.7–4.0)
MCH: 33.5 pg (ref 26.0–34.0)
MCHC: 33.8 g/dL (ref 30.0–36.0)
MCV: 99 fL (ref 80.0–100.0)
Monocytes Absolute: 0.6 10*3/uL (ref 0.1–1.0)
Monocytes Relative: 8 %
Neutro Abs: 4.8 10*3/uL (ref 1.7–7.7)
Neutrophils Relative %: 65 %
Platelets: 205 10*3/uL (ref 150–400)
RBC: 4.09 MIL/uL (ref 3.87–5.11)
RDW: 11.8 % (ref 11.5–15.5)
WBC: 7.4 10*3/uL (ref 4.0–10.5)
nRBC: 0 % (ref 0.0–0.2)

## 2020-04-11 LAB — TROPONIN I (HIGH SENSITIVITY)
Troponin I (High Sensitivity): 14 ng/L (ref <18)
Troponin I (High Sensitivity): 11 ng/L (ref <18)
Troponin I (High Sensitivity): 14 ng/L (ref <18)

## 2020-04-11 LAB — HEPATIC FUNCTION PANEL
ALT: 34 U/L (ref 0–44)
AST: 25 U/L (ref 15–41)
Albumin: 3.5 g/dL (ref 3.5–5.0)
Alkaline Phosphatase: 47 U/L (ref 38–126)
Bilirubin, Direct: <0.1 mg/dL (ref 0.0–0.2)
Total Bilirubin: 0.8 mg/dL (ref 0.3–1.2)
Total Protein: 6.8 g/dL (ref 6.5–8.1)

## 2020-04-11 LAB — COMPREHENSIVE METABOLIC PANEL
ALT: 37 U/L (ref 0–44)
AST: 29 U/L (ref 15–41)
Albumin: 3.8 g/dL (ref 3.5–5.0)
Alkaline Phosphatase: 49 U/L (ref 38–126)
Anion gap: 12 (ref 5–15)
BUN: 18 mg/dL (ref 8–23)
CO2: 20 mmol/L — ABNORMAL LOW (ref 22–32)
Calcium: 9.2 mg/dL (ref 8.9–10.3)
Chloride: 107 mmol/L (ref 98–111)
Creatinine, Ser: 1.01 mg/dL — ABNORMAL HIGH (ref 0.44–1.00)
GFR, Estimated: 53 mL/min — ABNORMAL LOW (ref >60)
Glucose, Bld: 334 mg/dL — ABNORMAL HIGH (ref 70–99)
Potassium: 3.9 mmol/L (ref 3.5–5.1)
Sodium: 139 mmol/L (ref 135–145)
Total Bilirubin: 0.9 mg/dL (ref 0.3–1.2)
Total Protein: 7.4 g/dL (ref 6.5–8.1)

## 2020-04-11 LAB — SARS CORONAVIRUS 2 BY RT PCR (HOSPITAL ORDER, PERFORMED IN ~~LOC~~ HOSPITAL LAB): SARS Coronavirus 2: NEGATIVE

## 2020-04-11 LAB — TSH: TSH: 1.089 u[IU]/mL (ref 0.350–4.500)

## 2020-04-11 LAB — T4, FREE: Free T4: 1.03 ng/dL (ref 0.61–1.12)

## 2020-04-11 LAB — MAGNESIUM: Magnesium: 1.5 mg/dL — ABNORMAL LOW (ref 1.7–2.4)

## 2020-04-11 LAB — GLUCOSE, CAPILLARY: Glucose-Capillary: 185 mg/dL — ABNORMAL HIGH (ref 70–99)

## 2020-04-11 LAB — HEMOGLOBIN A1C
Hgb A1c MFr Bld: 6.3 % — ABNORMAL HIGH (ref 4.8–5.6)
Mean Plasma Glucose: 134.11 mg/dL

## 2020-04-11 LAB — CBG MONITORING, ED: Glucose-Capillary: 132 mg/dL — ABNORMAL HIGH (ref 70–99)

## 2020-04-11 MED ORDER — IRBESARTAN 150 MG PO TABS
300.0000 mg | ORAL_TABLET | Freq: Every day | ORAL | Status: DC
  Administered 2020-04-11 – 2020-04-12 (×2): 300 mg via ORAL
  Filled 2020-04-11: qty 2
  Filled 2020-04-11: qty 1
  Filled 2020-04-11: qty 2

## 2020-04-11 MED ORDER — ACETAMINOPHEN 325 MG PO TABS
650.0000 mg | ORAL_TABLET | Freq: Once | ORAL | Status: AC
  Administered 2020-04-11: 650 mg via ORAL
  Filled 2020-04-11: qty 2

## 2020-04-11 MED ORDER — ACETAMINOPHEN 500 MG PO TABS: 1000.0000 mg | ORAL_TABLET | Freq: Four times a day (QID) | ORAL | Status: DC | PRN

## 2020-04-11 MED ORDER — INSULIN ASPART 100 UNIT/ML ~~LOC~~ SOLN
0.0000 [IU] | Freq: Three times a day (TID) | SUBCUTANEOUS | Status: DC
  Administered 2020-04-12: 1 [IU] via SUBCUTANEOUS
  Administered 2020-04-12: 3 [IU] via SUBCUTANEOUS
  Administered 2020-04-13 (×2): 2 [IU] via SUBCUTANEOUS

## 2020-04-11 MED ORDER — CARVEDILOL 12.5 MG PO TABS
12.5000 mg | ORAL_TABLET | Freq: Two times a day (BID) | ORAL | Status: DC
  Administered 2020-04-11 – 2020-04-13 (×4): 12.5 mg via ORAL
  Filled 2020-04-11 (×4): qty 1

## 2020-04-11 MED ORDER — ASPIRIN 300 MG RE SUPP: 300.0000 mg | RECTAL | Status: AC

## 2020-04-11 MED ORDER — POLYVINYL ALCOHOL 1.4 % OP SOLN
1.0000 [drp] | OPHTHALMIC | Status: DC | PRN
  Filled 2020-04-11: qty 15

## 2020-04-11 MED ORDER — AMLODIPINE BESYLATE 10 MG PO TABS
10.0000 mg | ORAL_TABLET | Freq: Every day | ORAL | Status: DC
  Administered 2020-04-11 – 2020-04-13 (×3): 10 mg via ORAL
  Filled 2020-04-11: qty 1
  Filled 2020-04-11: qty 2
  Filled 2020-04-11: qty 1

## 2020-04-11 MED ORDER — ACETAMINOPHEN 325 MG PO TABS: 325.0000 mg | ORAL_TABLET | Freq: Once | ORAL | Status: DC

## 2020-04-11 MED ORDER — ASPIRIN EC 81 MG PO TBEC
81.0000 mg | DELAYED_RELEASE_TABLET | Freq: Every day | ORAL | Status: DC
  Administered 2020-04-12: 81 mg via ORAL
  Filled 2020-04-11: qty 1

## 2020-04-11 MED ORDER — CLONIDINE HCL 0.1 MG PO TABS
0.1000 mg | ORAL_TABLET | Freq: Every day | ORAL | Status: DC
  Administered 2020-04-12 – 2020-04-13 (×2): 0.1 mg via ORAL
  Filled 2020-04-11 (×2): qty 1

## 2020-04-11 MED ORDER — NITROGLYCERIN 0.4 MG SL SUBL
SUBLINGUAL_TABLET | SUBLINGUAL | Status: AC
  Administered 2020-04-11: 0.4 mg via SUBLINGUAL
  Filled 2020-04-11: qty 1

## 2020-04-11 MED ORDER — CHOLESTYRAMINE 4 G PO PACK
4.0000 g | PACK | Freq: Every day | ORAL | Status: DC
  Administered 2020-04-11 – 2020-04-13 (×3): 4 g via ORAL
  Filled 2020-04-11 (×3): qty 1

## 2020-04-11 MED ORDER — ASPIRIN 81 MG PO CHEW: 324.0000 mg | CHEWABLE_TABLET | ORAL | Status: AC

## 2020-04-11 MED ORDER — PRAVASTATIN SODIUM 10 MG PO TABS
20.0000 mg | ORAL_TABLET | Freq: Every day | ORAL | Status: DC
  Administered 2020-04-11: 20 mg via ORAL
  Filled 2020-04-11: qty 2

## 2020-04-11 MED ORDER — PANTOPRAZOLE SODIUM 40 MG PO TBEC
40.0000 mg | DELAYED_RELEASE_TABLET | Freq: Every day | ORAL | Status: DC
  Administered 2020-04-11 – 2020-04-13 (×3): 40 mg via ORAL
  Filled 2020-04-11 (×3): qty 1

## 2020-04-11 MED ORDER — HEPARIN (PORCINE) 25000 UT/250ML-% IV SOLN
1100.0000 [IU]/h | INTRAVENOUS | Status: DC
  Administered 2020-04-11: 1000 [IU]/h via INTRAVENOUS
  Filled 2020-04-11 (×2): qty 250

## 2020-04-11 MED ORDER — IOHEXOL 350 MG/ML SOLN
80.0000 mL | Freq: Once | INTRAVENOUS | Status: AC | PRN
  Administered 2020-04-11: 80 mL via INTRAVENOUS

## 2020-04-11 MED ORDER — NITROGLYCERIN 0.4 MG SL SUBL: 0.4000 mg | SUBLINGUAL_TABLET | SUBLINGUAL | Status: DC | PRN

## 2020-04-11 MED ORDER — ALUM & MAG HYDROXIDE-SIMETH 200-200-20 MG/5ML PO SUSP
30.0000 mL | Freq: Three times a day (TID) | ORAL | Status: DC
  Filled 2020-04-11: qty 30

## 2020-04-11 MED ORDER — FUROSEMIDE 20 MG PO TABS
40.0000 mg | ORAL_TABLET | Freq: Every day | ORAL | Status: DC
  Filled 2020-04-11: qty 2

## 2020-04-11 MED ORDER — SODIUM CHLORIDE 0.9 % IV SOLN: INTRAVENOUS | Status: DC

## 2020-04-11 MED ORDER — FUROSEMIDE 40 MG PO TABS: 40.0000 mg | ORAL_TABLET | Freq: Every day | ORAL | Status: DC

## 2020-04-11 MED ORDER — CLONIDINE HCL 0.2 MG PO TABS
0.2000 mg | ORAL_TABLET | Freq: Every day | ORAL | Status: DC
  Administered 2020-04-11 – 2020-04-12 (×2): 0.2 mg via ORAL
  Filled 2020-04-11 (×2): qty 1

## 2020-04-11 MED ORDER — HYDRALAZINE HCL 20 MG/ML IJ SOLN: 10.0000 mg | Freq: Four times a day (QID) | INTRAMUSCULAR | Status: DC | PRN

## 2020-04-11 MED ORDER — PRAVASTATIN SODIUM 10 MG PO TABS
20.0000 mg | ORAL_TABLET | Freq: Every day | ORAL | Status: DC
  Filled 2020-04-11: qty 2

## 2020-04-11 MED ORDER — CLONIDINE HCL 0.1 MG PO TABS: 0.1000 mg | ORAL_TABLET | Freq: Every day | ORAL | Status: DC

## 2020-04-11 MED ORDER — INSULIN ASPART 100 UNIT/ML ~~LOC~~ SOLN: 0.0000 [IU] | Freq: Every day | SUBCUTANEOUS | Status: DC

## 2020-04-11 MED ORDER — ACETAMINOPHEN 325 MG PO TABS
650.0000 mg | ORAL_TABLET | ORAL | Status: DC | PRN
  Administered 2020-04-12: 650 mg via ORAL
  Filled 2020-04-11: qty 2

## 2020-04-11 MED ORDER — CLONIDINE HCL 0.2 MG PO TABS
0.2000 mg | ORAL_TABLET | Freq: Every day | ORAL | Status: DC
  Filled 2020-04-11: qty 1

## 2020-04-11 MED ORDER — ONDANSETRON HCL 4 MG/2ML IJ SOLN: 4.0000 mg | Freq: Four times a day (QID) | INTRAMUSCULAR | Status: DC | PRN

## 2020-04-11 MED ORDER — NITROGLYCERIN IN D5W 200-5 MCG/ML-% IV SOLN
0.0000 ug/min | INTRAVENOUS | Status: DC
  Administered 2020-04-11: 5 ug/min via INTRAVENOUS
  Administered 2020-04-12: 25 ug/min via INTRAVENOUS
  Filled 2020-04-11 (×2): qty 250

## 2020-04-11 MED ORDER — REFRESH P.M. OP OINT: TOPICAL_OINTMENT | OPHTHALMIC | Status: DC | PRN

## 2020-04-11 MED ORDER — MAGNESIUM OXIDE 400 (241.3 MG) MG PO TABS
400.0000 mg | ORAL_TABLET | Freq: Every day | ORAL | Status: DC
  Filled 2020-04-11: qty 1

## 2020-04-11 NOTE — ED Notes (Signed)
Pt is A-fib on the monitor ?

## 2020-04-11 NOTE — Telephone Encounter (Signed)
Spoke with pt's daughter and pt is currently in the ED for eval of SOB Per daughter pt has had a lot of excitement over the last few days with grandson getting married and another grandson announcing they are expecting Per daughter not sure what other symptoms if any Will forward to Dr Gwenlyn Found for review./cy

## 2020-04-11 NOTE — H&P (Addendum)
Cardiology History and Physical:   Patient ID: Allison Duffy MRN: [id number]; DOB: 1926/02/25  Admit date: 04/11/2020 Date of Consult: 04/11/2020  PCP:  Burnard Bunting, MD   [place]  Cardiologist:  Quay Burow, MD  Advanced Practice Provider:  No care team member to display Electrophysiologist:  None       CC;  Chest pain  Patient Profile:   Allison Duffy is a 87 y.o. female with a hx of angina, HTN, DM-2, HLD, Permanent AF, combined systolic and diastolic CHF, on anticoagulant, mod to severe MR who is being seen today for the evaluation of chest pain at the request of Dr Ralene Bathe.  History of Present Illness:   Allison Duffy has hx as above and neg nuc study for ischemia in 2015.  She had echo 01/2019 with EF 60-65%, mildly increased LVH.  Moderate to severe MVR.  Mildly elevated pulmonary artery systolic pressure.  Pt has had no desire for invasive mitral failure repair.  BP has been elevated on recent visits.    Today presented by EMS for chest pain that has been occurring for last week but increase of frequency and severity.  Pain in jaw and lt arm, in addition to chest.  pain waxes and wanes.  The pain improves with rest. It does wake her at night.  initially began once a day or so now with any activity.    She did take 324 mg asa this AM.  No lightheadedness no dizziness. No bleeding. No colds or fevers.   Other issues chromic diarrhea with colitis.  Chronic lower ext edema.   EKG:  The EKG was personally reviewed and demonstrates:  4:1 atrial flutter at 59 no acute ST changes.   Telemetry:  Telemetry was personally reviewed and demonstrates:  Atrial flutter   Na 139, K+ 3.9, BUN 18, Cr 1.01 LFTs normal  Hs troponin 14 Hgb 13.7 WBC 7.4 plts 205  CXR no active disease.   BP 145/67 P 58 afebrile pain free currently.  bp now 183/89  Did take AM meds including eliquis.    Past Medical History:  Diagnosis Date  . Acute on chronic combined  systolic and diastolic CHF (congestive heart failure) ([place]) 01/28/2019  . Arthritis    "scattered joints" (07/29/2013)  . Chest pain   . Colitis   . Gout attack ?1980's  . Hyperlipidemia   . Hypertension   . PONV (postoperative nausea and vomiting)   . Type II diabetes mellitus ([place])     Past Surgical History:  Procedure Laterality Date  . APPENDECTOMY    . CARPAL TUNNEL RELEASE Right   . CATARACT EXTRACTION W/ INTRAOCULAR LENS  IMPLANT, BILATERAL Bilateral   . CHOLECYSTECTOMY    . KNEE ARTHROSCOPY Right   . OLECRANON BURSECTOMY Left 07/29/2013   "in ER"  . PARATHYROIDECTOMY    . TONSILLECTOMY    . TOTAL SHOULDER REPLACEMENT  2017  . VAGINAL HYSTERECTOMY       Home Medications:  Prior to Admission medications   Medication Sig Start Date End Date Taking? Authorizing Provider  acetaminophen (TYLENOL) 500 MG tablet Take 1,000 mg by mouth every 6 (six) hours as needed.    [provider]  aluminum-magnesium hydroxide-simethicone (MAALOX) 200-200-20 MG/5ML SUSP Take 30 mLs by mouth 4 (four) times daily -  before meals and at bedtime.    [provider]  amLODipine (NORVASC) 10 MG tablet Take 10 mg by mouth daily.    [provider]  ascorbic acid (VITAMIN C) 500 MG tablet Take 500 mg by mouth daily.     [provider]  carvedilol (COREG) 12.5 MG tablet Take 12.5 mg by mouth 2 (two) times daily with a meal.    Burnard Bunting, MD  cholecalciferol (VITAMIN D) 1000 UNITS tablet Take 1,000 Units by mouth daily.    [provider]  cholestyramine (QUESTRAN) 4 g packet Take 4 g by mouth daily.    [provider]  cloNIDine (CATAPRES) 0.1 MG tablet Take 2 tablets(0.2 mg) in the morning and 1 tablet(0.1 mg) at bedtime 06/15/19   Lendon Colonel, NP  ELIQUIS 5 MG TABS tablet TAKE 1 TABLET(5 MG) BY MOUTH TWICE DAILY 12/26/19   Lorretta Harp, MD  furosemide (LASIX) 40 MG tablet Take 1 tablet (40 mg total) by mouth daily. Take 1  additional tablet daily if increased swelling, shortness of breath, or weight gain 5 lbs or more in 2 days 01/29/19 01/29/20  Emeterio Reeve, DO  glipiZIDE (GLUCOTROL) 10 MG tablet Take 10 mg by mouth daily before breakfast.    Burnard Bunting, MD  Glucosamine HCl (GLUCOSAMINE PO) Take 2,000 mg by mouth daily.    [provider]  magnesium oxide (MAG-OX) 400 MG tablet Take 400 mg by mouth daily.    [provider]  metFORMIN (GLUCOPHAGE) 500 MG tablet Take 500 mg by mouth 2 (two) times daily with a meal.     [provider]  Multiple Vitamin (MULTIVITAMIN WITH MINERALS) TABS Take 1 tablet by mouth daily.     [provider]  olmesartan (BENICAR) 40 MG tablet Take 40 mg by mouth daily.    Burnard Bunting, MD  omeprazole (PRILOSEC) 40 MG capsule Take 1 capsule (40 mg total) by mouth daily. 06/06/16   Johnson, Clanford L, MD  ONETOUCH ULTRA test strip USE AS DIRECTED TO TEST BLOOD SUGAR TWICE DAILY 06/29/19   [provider]  potassium chloride SA (KLOR-CON) 20 MEQ tablet Take 2 tablets (40 mEq total) by mouth daily for 5 days. 01/29/19 09/27/19  Emeterio Reeve, DO  pravastatin (PRAVACHOL) 20 MG tablet Take 20 mg by mouth daily.    [provider]  Probiotic Product (PRO-BIOTIC BLEND) CAPS Take by mouth. Takes OTC probiotic    Burnard Bunting, MD  sitaGLIPtin (JANUVIA) 100 MG tablet Take 100 mg by mouth daily.    [provider]  Turmeric (QC TUMERIC COMPLEX PO) Take by mouth. Patient reports takes OTC as needed    Burnard Bunting, MD  vitamin E 400 UNIT capsule Take 400 Units by mouth daily.    [provider]  Jay Schlichter Oil (ARTIFICIAL TEARS) ointment Place into both eyes as needed.    Burnard Bunting, MD    Inpatient Medications: Scheduled Meds:  Continuous Infusions:  PRN Meds:   Allergies:    Allergies  Allergen Reactions  . Codeine Nausea And Vomiting  . Hydrocodone     Nausea and vomiting  .  Sulfa Antibiotics Itching and Nausea Only    Social History:   Social History   Socioeconomic History  . Marital status: Widowed    Spouse name: 4 children and 3 living  . Number of children: 4  . Years of education: Not on file  . Highest education level: Not on file  Occupational History  . Occupation: Retired  Tobacco Use  . Smoking status: Former Smoker    Years: 5.00    Types: Cigarettes  Quit date: 07/05/1953    Years since quitting: 66.8  . Smokeless tobacco: Never Used  Vaping Use  . Vaping Use: Never used  Substance and Sexual Activity  . Alcohol use: No  . Drug use: No  . Sexual activity: Never  Other Topics Concern  . Not on file  Social History Narrative  . Not on file   Social Determinants of Health   Financial Resource Strain: Not on file  Food Insecurity: No Food Insecurity  . Worried About Charity fundraiser in the Last Year: Never true  . Ran Out of Food in the Last Year: Never true  Transportation Needs: No Transportation Needs  . Lack of Transportation (Medical): No  . Lack of Transportation (Non-Medical): No  Physical Activity: Not on file  Stress: Not on file  Social Connections: Not on file  Intimate Partner Violence: Not on file    Family History:    Family History  Problem Relation Age of Onset  . Hypertension Father   . Heart disease Father   . Heart disease Mother   . Diabetes Mother   . Hypertension Mother   . Heart disease Brother        both brothers  . Diabetes Brother   . Hypertension Brother      ROS:  Please see the history of present illness.  General:no colds or fevers, no weight changes Skin:no rashes or ulcers HEENT:no blurred vision, no congestion CV:see HPI PUL:see HPI YN:WGNFAOZ diarrhea constipation or melena, no indigestion GU:no hematuria, no dysuria MS:no joint pain, no claudication Neuro:no syncope, no lightheadedness Endo:+ diabetes, no thyroid disease  All other ROS reviewed and negative.      Physical Exam/Data:   Vitals:   04/11/20 1200 04/11/20 1230 04/11/20 1258 04/11/20 1346  BP: (!) 159/69 (!) 176/77 (!) 185/79 (!) 183/89  Pulse: (!) 58 (!) 56 (!) 58 77  Resp: 14 18 18 16   Temp:    98.3 F (36.8 C)  TempSrc:    Oral  SpO2: 100% 97% 97% 99%  Weight:      Height:        Intake/Output Summary (Last 24 hours) at 04/11/2020 1405 Last data filed at 04/11/2020 1350 Gross per 24 hour  Intake --  Output 1000 ml  Net -1000 ml   Last 3 Weights 04/11/2020 02/06/2020 09/27/2019  Weight (lbs) 173 lb 175 lb 177 lb 12.8 oz  Weight (kg) 78.472 kg 79.379 kg 80.65 kg     Body mass index is 27.92 kg/m.  General:  Well nourished, well developed, in no acute distress somewhat anxious HEENT: normal- behind Rt ear, appears dark keratosis but has bled, pt to see dermatology as outpt.  Lymph: no adenopathy Neck: no JVD Endocrine:  No thryomegaly Vascular: No carotid bruits; pedal pulses 2+ bilaterally   Cardiac:  normal S1, S2; RRR; no murmur gallup rub or click Lungs:  clear to auscultation bilaterally, no wheezing, rhonchi or rales  Abd: soft, nontender, no hepatomegaly  Ext: no edema Musculoskeletal:  No deformities, BUE and BLE strength normal and equal Skin: warm and dry  Neuro: alert and oriented X 3 MAE follows commands, no focal abnormalities noted Psych:  Normal affect     Relevant CV Studies: 01/28/2019  Echo IMPRESSIONS    1. Left ventricular ejection fraction, by visual estimation, is 60 to  65%. The left ventricle has normal function. There is mildly increased  left ventricular hypertrophy.  2. Left ventricular diastolic function could not  be evaluated.  3. The left ventricle has no regional wall motion abnormalities.  4. Global right ventricle has normal systolic function.The right  ventricular size is normal. No increase in right ventricular wall  thickness.  5. Left atrial size was normal.  6. Right atrial size was normal.  7. Presence of  pericardial fat pad.  8. Trivial pericardial effusion is present.  9. The mitral valve is normal in structure. Moderate to severe mitral  valve regurgitation.  10. The tricuspid valve is normal in structure.  11. The aortic valve is tricuspid. Aortic valve regurgitation is not  visualized.  12. The pulmonic valve was normal in structure. Pulmonic valve  regurgitation is trivial.  13. Mildly elevated pulmonary artery systolic pressure.  14. The atrial septum is grossly normal.   FINDINGS  Left Ventricle: Left ventricular ejection fraction, by visual estimation,  is 60 to 65%. The left ventricle has normal function. The left ventricle  has no regional wall motion abnormalities. There is mildly increased left  ventricular hypertrophy.  Asymmetric left ventricular hypertrophy. The left ventricular diastology  could not be evaluated due to atrial fibrillation. Left ventricular  diastolic function could not be evaluated.   Right Ventricle: The right ventricular size is normal. No increase in  right ventricular wall thickness. Global RV systolic function is has  normal systolic function. The tricuspid regurgitant velocity is 2.74 m/s,  and with an assumed right atrial pressure  of 3 mmHg, the estimated right ventricular systolic pressure is mildly  elevated at 33.0 mmHg.   Left Atrium: Left atrial size was normal in size.   Right Atrium: Right atrial size was normal in size   Pericardium: Trivial pericardial effusion is present. Presence of  pericardial fat pad.   Mitral Valve: The mitral valve is normal in structure. Moderate to severe  mitral valve regurgitation. MV peak gradient, 3.4 mmHg.   Tricuspid Valve: The tricuspid valve is normal in structure. Tricuspid  valve regurgitation mild-moderate.   Aortic Valve: The aortic valve is tricuspid. Aortic valve regurgitation is  not visualized. Mild aortic valve annular calcification. Aortic valve mean  gradient measures 4.6  mmHg. Aortic valve peak gradient measures 7.9 mmHg.  Aortic valve area, by VTI  measures 1.83 cm.   Pulmonic Valve: The pulmonic valve was normal in structure. Pulmonic valve  regurgitation is trivial. Pulmonic regurgitation is trivial.   Aorta: The aortic root and ascending aorta are structurally normal, with  no evidence of dilitation.   IAS/Shunts: The atrial septum is grossly normal.       Laboratory Data:  High Sensitivity Troponin:   Recent Labs  Lab 04/11/20 0955  TROPONINIHS 14     Chemistry Recent Labs  Lab 04/11/20 0955  NA 139  K 3.9  CL 107  CO2 20*  GLUCOSE 334*  BUN 18  CREATININE 1.01*  CALCIUM 9.2  GFRNONAA 53*  ANIONGAP 12    Recent Labs  Lab 04/11/20 0955  PROT 7.4  ALBUMIN 3.8  AST 29  ALT 37  ALKPHOS 49  BILITOT 0.9   Hematology Recent Labs  Lab 04/11/20 0955  WBC 7.4  RBC 4.09  HGB 13.7  HCT 40.5  MCV 99.0  MCH 33.5  MCHC 33.8  RDW 11.8  PLT 205   BNPNo results for input(s): BNP, PROBNP in the last 168 hours.  DDimer No results for input(s): DDIMER in the last 168 hours.   Radiology/Studies:  [facility] Chest Port 1 View  Result Date: 04/11/2020 CLINICAL  DATA:  Chest pain, shortness of breath EXAM: PORTABLE CHEST 1 VIEW COMPARISON:  01/27/2019 FINDINGS: Heart and mediastinal contours are within normal limits. No focal opacities or effusions. No acute bony abnormality. Prior right shoulder replacement. Degenerative changes in the left shoulder. IMPRESSION: No active disease. Electronically Signed   By: Rolm Baptise M.D.   On: 04/11/2020 10:28     Assessment and Plan:   1. Chest pain -> highly concerning for PROGRESSIVE/UNSTABLE ANGINA -> based on the fact that he is constantly exertional, and now happening with less exertion, lasting longer and occasionally with rest or anxiety. neg nuc 2015 with no further ischemic evaluation in a patient with permanent A. fib.   First troponin neg.-follow trend   Second troponin pending.   With ongoing symptoms and elevated BP will begin IV NTG and then IV heparin, would recommend cardiac cath or cardiac CTA but pt and her daughter seem receptive to cath Dr. Ellyn Hack to discuss.  Will admit with crescendo nature of pain.  Will check echo if EF down then cath if stable then cardiac CTA to eval CAD  2. HTN poorly controlled- on amlodipine 10, coreg 12.5 BID, clonidine 0.2 in am and 0.1 in pm lasix 40 daily and extra prn, benicar 40 daily, begin IV NTG for angina and HTN 3. Permanent atrial flutter since 12/2018 on anticoagulation with eliquis if cath decided on will hold and begin heparin. 4. Moderate to severe MR on last echo 01/2019, pt has not wished to have intervention, no follow up echoes have been done. 5. HLD on statin followed by PCP  On pravachol 6. DM-2 on januvia, metformin, - if admitted will hold metformin, and use SSI    Will admit to progressive with ongoing angina.  Neg troponin but high risk for CAD with DM, HTN.   EKG labs stable.    Risk Assessment/Risk Scores:     HEAR Score (for undifferentiated chest pain):  HEAR Score: 7    CHA2DS2-VASc Score = 6  This indicates a 9.7% annual risk of stroke. The patient's score is based upon: CHF History: Yes HTN History: Yes Diabetes History: Yes Stroke History: No Vascular Disease History: No Age Score: 2 Gender Score: 1        For questions or updates, please contact [place] Please consult [email or web address] for contact info under    Signed, Cecilie Kicks, NP  04/11/2020 2:05 PM   ATTENDING ATTESTATION  I have seen, examined and evaluated the patient this evening along with Molli Hazard, NP.  After reviewing all the available data and chart, we discussed the patients laboratory, study & physical findings as well as symptoms in detail. I agree with her findings, examination as well as impression recommendations as per our discussion.    Attending adjustments noted in italics.   Concerning situation one  87 year old who is coming in now for exertional chest discomfort happening with more frequency and more intensity.  She feels mostly a discomfort or pressure in the left lateral chest radiating to her left shoulder and jaw.  Somewhat atypical features but but the description of timing and exacerbation is very consistent with progressive/unstable angina.  In that with that in mind I do think that the aggressive ischemic evaluation is warranted.  She is on heparin, which is definitely reasonable until troponin negativity. She is profoundly hypotensive with multiple medications, will use IV nitroglycerin for blood pressure but also antianginal benefit.  She is still having some discomfort right now so we  will give her a dose of sublingual nitro.  We will need to continue to titrate her medications, I am not a fan of clonidine in elderly patient, however at present she probably still needs aggressive management.  We will use IV hydralazine as needed.  I spent about 40 minutes with the patient and her daughter discussing my concerns of her symptoms, their concerns of her symptoms and the fact that she would not be here were these not very concerning episodes.  She herself would not be a favor of aggressive management, but the symptoms have become untenable.  As such, I do think an ischemic evaluation is warranted and I recommended a cardiac CT angiogram.  After describing the options of medical management without testing, Myoview testing versus cardiac CTA or cardiac catheterization, the patient and daughter agree that this is a the best "middle-of-the-road" yet very accurate based on evaluation.    Depending on the CT scan results, we can then determine whether we move forward with invasive evaluation or not.  For now, continue IV heparin, IV nitroglycerin as needed sublingual nitro.  As needed hydralazine to try to get the blood pressure down below 140s.  Clearly her symptoms were made worse when her  pressures went up -> which could indicate demand ischemia.  We will admit to cardiology.    Glenetta Hew, M.D., M.S. Interventional Cardiologist   Pager # [phone] Phone # [phone] [address] [place], [place] [address]

## 2020-04-11 NOTE — ED Notes (Signed)
Dr. Ellyn Hack at the bedside. Order for [employer] tab at this time. Pt hypertensive, and having headache 10/10 in which tylenol was given. MD would like nitro given now instead of titration of Nitro drip due to severe headache.

## 2020-04-11 NOTE — ED Provider Notes (Signed)
[place] EMERGENCY DEPARTMENT Provider Note   CSN: [id number] Arrival date & time: 04/11/20  7591     History Chief Complaint  Patient presents with  . Chest Pain    Allison Duffy is a 87 y.o. female.  The history is provided by the patient and medical records.  Chest Pain  Allison Duffy is a 87 y.o. female who presents to the Emergency Department complaining of chest pain. She presents to the ED for chest pain for the last few days.  Pain is described as pressure, located in the central and left chest.  Pain is nonradiating.  She has additional pain in her jaw and left arm.  Pain waxes and wanes.  She has associated malaise.  Pain improves with rest. No current pain on ED evaluation.  Denies fever.  Has SOB/DOE - mild.  Has cough.  Has chronic abdomina pain due to colitis.  Has chronic diarrhea.  Has nausea today.  Has occasional dysuria.  Has lower extremity edema- chronic.    Lives alone.  No prior similar sxs.  No recent medication changes.   HPI: A 87 year old patient with a history of treated diabetes, hypertension and hypercholesterolemia presents for evaluation of chest pain. Initial onset of pain was less than one hour ago. The patient's chest pain is described as heaviness/pressure/tightness and is worse with exertion. The patient's chest pain is middle- or left-sided, is not well-localized, is not sharp and does radiate to the arms/jaw/neck. The patient does not complain of nausea and denies diaphoresis. The patient has no history of stroke, has no history of peripheral artery disease, has not smoked in the past 90 days, has no relevant family history of coronary artery disease (first degree relative at less than age 30) and does not have an elevated BMI (>=30).   Past Medical History:  Diagnosis Date  . Acute on chronic combined systolic and diastolic CHF (congestive heart failure) ([place]) 01/28/2019  . Arthritis    "scattered joints" (07/29/2013)  .  Chest pain   . Colitis   . Gout attack ?1980's  . Hyperlipidemia   . Hypertension   . PONV (postoperative nausea and vomiting)   . Type II diabetes mellitus [facility])     Patient Active Problem List   Diagnosis Date Noted  . Mitral regurgitation 03/01/2019  . Chronic diastolic CHF (congestive heart failure) ([employer]) 01/28/2019  . Atrial flutter by electrocardiogram ([place]) 01/28/2019  . Colitis, acute 05/31/2016  . Primary osteoarthritis of right shoulder 12/27/2014  . Pulmonary nodule 06/23/2014  . Essential hypertension 07/05/2013  . Hyperlipidemia 07/05/2013  . Type 2 diabetes mellitus ([place]) 07/05/2013    Past Surgical History:  Procedure Laterality Date  . APPENDECTOMY    . CARPAL TUNNEL RELEASE Right   . CATARACT EXTRACTION W/ INTRAOCULAR LENS  IMPLANT, BILATERAL Bilateral   . CHOLECYSTECTOMY    . KNEE ARTHROSCOPY Right   . OLECRANON BURSECTOMY Left 07/29/2013   "in ER"  . PARATHYROIDECTOMY    . TONSILLECTOMY    . TOTAL SHOULDER REPLACEMENT  2017  . VAGINAL HYSTERECTOMY       OB History   No obstetric history on file.     Family History  Problem Relation Age of Onset  . Hypertension Father   . Heart disease Father   . Heart disease Mother   . Diabetes Mother   . Hypertension Mother   . Heart disease Brother        both brothers  .  Diabetes Brother   . Hypertension Brother     Social History   Tobacco Use  . Smoking status: Former Smoker    Years: 5.00    Types: Cigarettes    Quit date: 07/05/1953    Years since quitting: 66.8  . Smokeless tobacco: Never Used  Vaping Use  . Vaping Use: Never used  Substance Use Topics  . Alcohol use: No  . Drug use: No    Home Medications Prior to Admission medications   Medication Sig Start Date End Date Taking? Authorizing Provider  acetaminophen (TYLENOL) 500 MG tablet Take 1,000 mg by mouth every 6 (six) hours as needed.    [provider]  aluminum-magnesium hydroxide-simethicone (MAALOX) 200-200-20  MG/5ML SUSP Take 30 mLs by mouth 4 (four) times daily -  before meals and at bedtime.    [provider]  amLODipine (NORVASC) 10 MG tablet Take 10 mg by mouth daily.    [provider]  ascorbic acid (VITAMIN C) 500 MG tablet Take 500 mg by mouth daily.     [provider]  carvedilol (COREG) 12.5 MG tablet Take 12.5 mg by mouth 2 (two) times daily with a meal.    Burnard Bunting, MD  cholecalciferol (VITAMIN D) 1000 UNITS tablet Take 1,000 Units by mouth daily.    [provider]  cholestyramine (QUESTRAN) 4 g packet Take 4 g by mouth daily.    [provider]  cloNIDine (CATAPRES) 0.1 MG tablet Take 2 tablets(0.2 mg) in the morning and 1 tablet(0.1 mg) at bedtime 06/15/19   Lendon Colonel, NP  ELIQUIS 5 MG TABS tablet TAKE 1 TABLET(5 MG) BY MOUTH TWICE DAILY 12/26/19   Lorretta Harp, MD  furosemide (LASIX) 40 MG tablet Take 1 tablet (40 mg total) by mouth daily. Take 1 additional tablet daily if increased swelling, shortness of breath, or weight gain 5 lbs or more in 2 days 01/29/19 01/29/20  Emeterio Reeve, DO  glipiZIDE (GLUCOTROL) 10 MG tablet Take 10 mg by mouth daily before breakfast.    Burnard Bunting, MD  Glucosamine HCl (GLUCOSAMINE PO) Take 2,000 mg by mouth daily.    [provider]  magnesium oxide (MAG-OX) 400 MG tablet Take 400 mg by mouth daily.    [provider]  metFORMIN (GLUCOPHAGE) 500 MG tablet Take 500 mg by mouth 2 (two) times daily with a meal.     [provider]  Multiple Vitamin (MULTIVITAMIN WITH MINERALS) TABS Take 1 tablet by mouth daily.     [provider]  olmesartan (BENICAR) 40 MG tablet Take 40 mg by mouth daily.    Burnard Bunting, MD  omeprazole (PRILOSEC) 40 MG capsule Take 1 capsule (40 mg total) by mouth daily. 06/06/16   Johnson, Clanford L, MD  ONETOUCH ULTRA test strip USE AS DIRECTED TO TEST BLOOD SUGAR TWICE DAILY 06/29/19   [provider]  potassium  chloride SA (KLOR-CON) 20 MEQ tablet Take 2 tablets (40 mEq total) by mouth daily for 5 days. 01/29/19 09/27/19  Emeterio Reeve, DO  pravastatin (PRAVACHOL) 20 MG tablet Take 20 mg by mouth daily.    [provider]  Probiotic Product (PRO-BIOTIC BLEND) CAPS Take by mouth. Takes OTC probiotic    Burnard Bunting, MD  sitaGLIPtin (JANUVIA) 100 MG tablet Take 100 mg by mouth daily.    [provider]  Turmeric (QC TUMERIC COMPLEX PO) Take by mouth. Patient reports takes OTC as needed    Burnard Bunting, MD  vitamin E 400 UNIT capsule Take 400 Units by mouth daily.    [provider]  Jay Schlichter Oil (ARTIFICIAL TEARS) ointment Place into both eyes as needed.    Burnard Bunting, MD    Allergies    Codeine, Hydrocodone, and Sulfa antibiotics  Review of Systems   Review of Systems  Cardiovascular: Positive for chest pain.  All other systems reviewed and are negative.   Physical Exam Updated Vital Signs BP (!) 172/71   Pulse (!) 58   Temp 98.3 F (36.8 C) (Oral)   Resp 15   Ht 5\' 6"  (1.676 m)   Wt 78.5 kg   SpO2 100%   BMI 27.92 kg/m   Physical Exam Vitals and nursing note reviewed.  Constitutional:      Appearance: She is well-developed.  HENT:     Head: Normocephalic and atraumatic.     Comments: 1.5 cm oval eschar to right lateral neck with mild local erythema. Cardiovascular:     Rate and Rhythm: Regular rhythm. Bradycardia present.     Heart sounds: No murmur heard.   Pulmonary:     Effort: Pulmonary effort is normal. No respiratory distress.     Breath sounds: Normal breath sounds.  Abdominal:     Palpations: Abdomen is soft.     Tenderness: There is no abdominal tenderness. There is no guarding or rebound.  Musculoskeletal:        General: No tenderness.     Comments: 1+ pitting edema to LLE  Skin:    General: Skin is warm and dry.  Neurological:     Mental Status: She is alert and oriented to person, place, and time.   Psychiatric:        Behavior: Behavior normal.     ED Results / Procedures / Treatments   Labs (all labs ordered are listed, but only abnormal results are displayed) Labs Reviewed  COMPREHENSIVE METABOLIC PANEL - Abnormal; Notable for the following components:      Result Value   CO2 20 (*)    Glucose, Bld 334 (*)    Creatinine, Ser 1.01 (*)    GFR, Estimated 53 (*)    All other components within normal limits  SARS CORONAVIRUS 2 BY RT PCR (HOSPITAL ORDER, [place] [facility])  CBC WITH DIFFERENTIAL/PLATELET  TROPONIN I (HIGH SENSITIVITY)  TROPONIN I (HIGH SENSITIVITY)  TROPONIN I (HIGH SENSITIVITY)    EKG EKG Interpretation  Date/Time:  Wednesday April 11 2020 09:30:03 EDT Ventricular Rate:  59 PR Interval:    QRS Duration: 92 QT Interval:  445 QTC Calculation: 441 R Axis:   23 Text Interpretation: Atrial flutter with predominant 4:1 AV block Confirmed by Quintella Reichert [phone]) on 04/11/2020 9:36:12 AM   Radiology DG Chest Port 1 View  Result Date: 04/11/2020 CLINICAL DATA:  Chest pain, shortness of breath EXAM: PORTABLE CHEST 1 VIEW COMPARISON:  01/27/2019 FINDINGS: Heart and mediastinal contours are within normal limits. No focal opacities or effusions. No acute bony abnormality. Prior right shoulder replacement. Degenerative changes in the left shoulder. IMPRESSION: No active disease. Electronically Signed   By: Rolm Baptise M.D.   On: 04/11/2020 10:28    Procedures Procedures   Medications Ordered in ED Medications  nitroGLYCERIN 50 mg in dextrose 5 % 250 mL (0.2 mg/mL) infusion (5 mcg/min Intravenous New Bag/Given 04/11/20 1418)    ED Course  I have reviewed the triage vital signs and the nursing notes.  Pertinent labs & imaging results  that were available during my care of the patient were reviewed by me and considered in my medical decision making (see chart for details).    MDM Rules/Calculators/A&P HEAR Score: 7                         Patient with history of atrial flutter here for evaluation of intermittent chest pain. She is under increased stress. EKG with flutter. Initial troponin is negative. Presentation is not consistent with dissection, PE.  Cardiology consulted for recommendations. Final Clinical Impression(s) / ED Diagnoses Final diagnoses:  None    Rx / DC Orders ED Discharge Orders    None       Quintella Reichert, MD 04/11/20 1510

## 2020-04-11 NOTE — ED Triage Notes (Signed)
Pt arrived to ED via EMS from home. Pt reports started with chest pressure yesterday center, pain radiating to left arm and jaw. Pain has been intermitting since. Pt did take 324 mg asa this am. Pain is 4/10 in left arm and pressure to left jaw, denies any chest pain at this time.

## 2020-04-11 NOTE — Progress Notes (Signed)
ANTICOAGULATION CONSULT NOTE - Initial Consult  Pharmacy Consult for heparin Indication: chest pain/ACS  Allergies  Allergen Reactions  . Codeine Nausea And Vomiting  . Hydrocodone     Nausea and vomiting  . Sulfa Antibiotics Itching and Nausea Only    Patient Measurements: Height: 5\' 6"  (167.6 cm) Weight: 78.5 kg (173 lb) IBW/kg (Calculated) : 59.3 Heparin Dosing Weight: 75 kg   Vital Signs: Temp: 98.3 F (36.8 C) (03/16 1346) Temp Source: Oral (03/16 1346) BP: 170/67 (03/16 1453) Pulse Rate: 58 (03/16 1453)  Labs: Recent Labs    04/11/20 0955  HGB 13.7  HCT 40.5  PLT 205  CREATININE 1.01*  TROPONINIHS 14    Estimated Creatinine Clearance: 38.4 mL/min (A) (by C-G formula based on SCr of 1.01 mg/dL (H)).   Medical History: Past Medical History:  Diagnosis Date  . Acute on chronic combined systolic and diastolic CHF (congestive heart failure) ([place]) 01/28/2019  . Arthritis    "scattered joints" (07/29/2013)  . Chest pain   . Colitis   . Gout attack ?1980's  . Hyperlipidemia   . Hypertension   . PONV (postoperative nausea and vomiting)   . Type II diabetes mellitus ([facility])     Medications:  (Not in a hospital admission)   Assessment: 76 YOF who presented with chest pain. Patient has a h/o AFib on eliquis at home. Of note, last dose of Eliquis was this morning. Pharmacy consulted to transition to IV heparin for ACS.   H/H and Plt wnl. SCr 1.01.   Goal of Therapy:  Heparin level 0.3-0.7 units/ml aPTT 66-102 seconds Monitor platelets by anticoagulation protocol: Yes   Plan:  -Start heparin infusion at 1000 units/hr @ 8 PM. No bolus -F/u 8 hr aPTT -Monitor daily HL, aPTT, CBC and s/s of bleeding   Albertina Parr, PharmD., BCPS, BCCCP Clinical Pharmacist Please refer to [facility] for unit-specific pharmacist

## 2020-04-11 NOTE — Telephone Encounter (Signed)
Pt c/o Shortness Of Breath: STAT if SOB developed within the last 24 hours or pt is noticeably SOB on the phone  1. Are you currently SOB (can you hear that pt is SOB on the phone)?  She states the patient became extremely SOB this morning and she called 911. They ran an EKG and everything was normal, so she assumes the SOB is not heart related. Patient is currently still with EMS, on her way to [facility].  2. How long have you been experiencing SOB?  Patient's daughter states the patient is always SOB, but lately she has been extremely SOB due to excess excitement in her life   3. Are you SOB when sitting or when up moving around? When up and moving around  4. Are you currently experiencing any other symptoms?  Jaw pain, left arm pain

## 2020-04-12 ENCOUNTER — Inpatient Hospital Stay (HOSPITAL_COMMUNITY): Payer: Medicare Other

## 2020-04-12 ENCOUNTER — Encounter (HOSPITAL_COMMUNITY)
Admission: EM | Disposition: A | Discharge status: Home Health | Payer: Self-pay | Source: Ambulatory Visit | Attending: Cardiology

## 2020-04-12 ENCOUNTER — Other Ambulatory Visit: Payer: Self-pay | Admitting: *Deleted

## 2020-04-12 DIAGNOSIS — Z794 Long term (current) use of insulin: Secondary | ICD-10-CM

## 2020-04-12 DIAGNOSIS — R079 Chest pain, unspecified: Secondary | ICD-10-CM

## 2020-04-12 DIAGNOSIS — I25110 Atherosclerotic heart disease of native coronary artery with unstable angina pectoris: Principal | ICD-10-CM

## 2020-04-12 DIAGNOSIS — I10 Essential (primary) hypertension: Secondary | ICD-10-CM

## 2020-04-12 DIAGNOSIS — R931 Abnormal findings on diagnostic imaging of heart and coronary circulation: Secondary | ICD-10-CM

## 2020-04-12 DIAGNOSIS — E1159 Type 2 diabetes mellitus with other circulatory complications: Secondary | ICD-10-CM

## 2020-04-12 HISTORY — PX: LEFT HEART CATH AND CORONARY ANGIOGRAPHY: CATH118249

## 2020-04-12 LAB — BASIC METABOLIC PANEL
Anion gap: 11 (ref 5–15)
BUN: 18 mg/dL (ref 8–23)
CO2: 23 mmol/L (ref 22–32)
Calcium: 9.1 mg/dL (ref 8.9–10.3)
Chloride: 106 mmol/L (ref 98–111)
Creatinine, Ser: 1.08 mg/dL — ABNORMAL HIGH (ref 0.44–1.00)
GFR, Estimated: 48 mL/min — ABNORMAL LOW (ref >60)
Glucose, Bld: 153 mg/dL — ABNORMAL HIGH (ref 70–99)
Potassium: 3.6 mmol/L (ref 3.5–5.1)
Sodium: 140 mmol/L (ref 135–145)

## 2020-04-12 LAB — CBC
HCT: 34.3 % — ABNORMAL LOW (ref 36.0–46.0)
Hemoglobin: 11.9 g/dL — ABNORMAL LOW (ref 12.0–15.0)
MCH: 33.5 pg (ref 26.0–34.0)
MCHC: 34.7 g/dL (ref 30.0–36.0)
MCV: 96.6 fL (ref 80.0–100.0)
Platelets: 206 10*3/uL (ref 150–400)
RBC: 3.55 MIL/uL — ABNORMAL LOW (ref 3.87–5.11)
RDW: 11.8 % (ref 11.5–15.5)
WBC: 9.2 10*3/uL (ref 4.0–10.5)
nRBC: 0 % (ref 0.0–0.2)

## 2020-04-12 LAB — ECHOCARDIOGRAM COMPLETE
Area-P 1/2: 2.66 cm2
Height: 66 in
S' Lateral: 3 cm
Weight: 2713.6 oz

## 2020-04-12 LAB — GLUCOSE, CAPILLARY
Glucose-Capillary: 131 mg/dL — ABNORMAL HIGH (ref 70–99)
Glucose-Capillary: 202 mg/dL — ABNORMAL HIGH (ref 70–99)
Glucose-Capillary: 99 mg/dL (ref 70–99)
Glucose-Capillary: 185 mg/dL — ABNORMAL HIGH (ref 70–99)

## 2020-04-12 LAB — LIPID PANEL
Cholesterol: 124 mg/dL (ref 0–200)
HDL: 37 mg/dL — ABNORMAL LOW (ref >40)
LDL Cholesterol: 56 mg/dL (ref 0–99)
Total CHOL/HDL Ratio: 3.4 RATIO
Triglycerides: 155 mg/dL — ABNORMAL HIGH (ref <150)
VLDL: 31 mg/dL (ref 0–40)

## 2020-04-12 LAB — APTT: aPTT: 57 seconds — ABNORMAL HIGH (ref 24–36)

## 2020-04-12 LAB — HEPARIN LEVEL (UNFRACTIONATED): Heparin Unfractionated: 1.76 IU/mL — ABNORMAL HIGH (ref 0.30–0.70)

## 2020-04-12 SURGERY — LEFT HEART CATH AND CORONARY ANGIOGRAPHY
Anesthesia: LOCAL

## 2020-04-12 MED ORDER — SODIUM CHLORIDE 0.9 % WEIGHT BASED INFUSION
3.0000 mL/kg/h | INTRAVENOUS | Status: DC
  Administered 2020-04-12: 3 mL/kg/h via INTRAVENOUS

## 2020-04-12 MED ORDER — FENTANYL CITRATE (PF) 100 MCG/2ML IJ SOLN
INTRAMUSCULAR | Status: DC | PRN
  Administered 2020-04-12: 25 ug via INTRAVENOUS

## 2020-04-12 MED ORDER — LIDOCAINE HCL (PF) 1 % IJ SOLN
INTRAMUSCULAR | Status: DC | PRN
  Administered 2020-04-12: 2 mL

## 2020-04-12 MED ORDER — SODIUM CHLORIDE 0.9 % WEIGHT BASED INFUSION: 1.0000 mL/kg/h | INTRAVENOUS | Status: DC

## 2020-04-12 MED ORDER — MIDAZOLAM HCL 2 MG/2ML IJ SOLN
INTRAMUSCULAR | Status: DC | PRN
  Administered 2020-04-12: 1 mg via INTRAVENOUS

## 2020-04-12 MED ORDER — ONDANSETRON HCL 4 MG/2ML IJ SOLN: 4.0000 mg | Freq: Four times a day (QID) | INTRAMUSCULAR | Status: DC | PRN

## 2020-04-12 MED ORDER — SODIUM CHLORIDE 0.9% FLUSH: 3.0000 mL | INTRAVENOUS | Status: DC | PRN

## 2020-04-12 MED ORDER — APIXABAN 5 MG PO TABS
5.0000 mg | ORAL_TABLET | Freq: Two times a day (BID) | ORAL | Status: DC
  Administered 2020-04-12 – 2020-04-13 (×2): 5 mg via ORAL
  Filled 2020-04-12 (×2): qty 1

## 2020-04-12 MED ORDER — ASPIRIN 81 MG PO CHEW
81.0000 mg | CHEWABLE_TABLET | Freq: Every day | ORAL | Status: DC
  Administered 2020-04-13: 81 mg via ORAL
  Filled 2020-04-12: qty 1

## 2020-04-12 MED ORDER — ROSUVASTATIN CALCIUM 20 MG PO TABS
20.0000 mg | ORAL_TABLET | Freq: Every day | ORAL | Status: DC
  Administered 2020-04-12: 20 mg via ORAL
  Filled 2020-04-12: qty 1

## 2020-04-12 MED ORDER — SODIUM CHLORIDE 0.9 % IV SOLN: 250.0000 mL | INTRAVENOUS | Status: DC | PRN

## 2020-04-12 MED ORDER — VERAPAMIL HCL 2.5 MG/ML IV SOLN
INTRA_ARTERIAL | Status: DC | PRN
  Administered 2020-04-12: 6 mL via INTRA_ARTERIAL

## 2020-04-12 MED ORDER — HYDRALAZINE HCL 20 MG/ML IJ SOLN: 10.0000 mg | INTRAMUSCULAR | Status: AC | PRN

## 2020-04-12 MED ORDER — HEPARIN (PORCINE) IN NACL 1000-0.9 UT/500ML-% IV SOLN
INTRAVENOUS | Status: DC | PRN
  Administered 2020-04-12 (×2): 500 mL

## 2020-04-12 MED ORDER — SODIUM CHLORIDE 0.9% FLUSH: 3.0000 mL | Freq: Two times a day (BID) | INTRAVENOUS | Status: DC

## 2020-04-12 MED ORDER — ISOSORBIDE MONONITRATE ER 60 MG PO TB24
60.0000 mg | ORAL_TABLET | Freq: Every day | ORAL | Status: DC
  Administered 2020-04-12 – 2020-04-13 (×2): 60 mg via ORAL
  Filled 2020-04-12 (×2): qty 1

## 2020-04-12 MED ORDER — ACETAMINOPHEN 325 MG PO TABS
650.0000 mg | ORAL_TABLET | ORAL | Status: DC | PRN
  Administered 2020-04-13: 650 mg via ORAL
  Filled 2020-04-12: qty 2

## 2020-04-12 MED ORDER — ASPIRIN 81 MG PO CHEW: 81.0000 mg | CHEWABLE_TABLET | ORAL | Status: DC

## 2020-04-12 MED ORDER — POTASSIUM CHLORIDE CRYS ER 20 MEQ PO TBCR
20.0000 meq | EXTENDED_RELEASE_TABLET | Freq: Once | ORAL | Status: AC
  Administered 2020-04-12: 20 meq via ORAL
  Filled 2020-04-12: qty 1

## 2020-04-12 MED ORDER — SODIUM CHLORIDE 0.9 % IV SOLN: INTRAVENOUS | Status: AC

## 2020-04-12 MED ORDER — SODIUM CHLORIDE 0.9% FLUSH
3.0000 mL | Freq: Two times a day (BID) | INTRAVENOUS | Status: DC
  Administered 2020-04-13: 3 mL via INTRAVENOUS

## 2020-04-12 MED ORDER — LABETALOL HCL 5 MG/ML IV SOLN: 10.0000 mg | INTRAVENOUS | Status: AC | PRN

## 2020-04-12 MED ORDER — HEPARIN SODIUM (PORCINE) 1000 UNIT/ML IJ SOLN
INTRAMUSCULAR | Status: DC | PRN
  Administered 2020-04-12: 4000 [IU] via INTRAVENOUS

## 2020-04-12 MED ORDER — ASPIRIN 81 MG PO CHEW: 81.0000 mg | CHEWABLE_TABLET | ORAL | Status: AC

## 2020-04-12 MED ORDER — ASPIRIN 81 MG PO CHEW: 81.0000 mg | CHEWABLE_TABLET | Freq: Every day | ORAL | Status: DC

## 2020-04-12 MED ORDER — IOHEXOL 350 MG/ML SOLN
INTRAVENOUS | Status: DC | PRN
  Administered 2020-04-12: 60 mL

## 2020-04-12 MED ORDER — MAGNESIUM SULFATE 2 GM/50ML IV SOLN
2.0000 g | Freq: Once | INTRAVENOUS | Status: AC
  Administered 2020-04-12: 2 g via INTRAVENOUS
  Filled 2020-04-12: qty 50

## 2020-04-12 MED ORDER — NITROGLYCERIN 1 MG/10 ML FOR IR/CATH LAB
INTRA_ARTERIAL | Status: AC
  Filled 2020-04-12: qty 10

## 2020-04-12 SURGICAL SUPPLY — 12 items
CATH INFINITI 5FR ANG PIGTAIL (CATHETERS) IMPLANT
CATH OPTITORQUE TIG 4.0 5F (CATHETERS) ×1 IMPLANT
DEVICE RAD COMP TR BAND LRG (VASCULAR PRODUCTS) ×1 IMPLANT
GLIDESHEATH SLEND A-KIT 6F 22G (SHEATH) ×1 IMPLANT
GUIDEWIRE INQWIRE 1.5J.035X260 (WIRE) IMPLANT
INQWIRE 1.5J .035X260CM (WIRE) ×2
KIT HEART LEFT (KITS) ×2 IMPLANT
PACK CARDIAC CATHETERIZATION (CUSTOM PROCEDURE TRAY) ×2 IMPLANT
SYR MEDRAD MARK 7 150ML (SYRINGE) ×2 IMPLANT
TRANSDUCER W/STOPCOCK (MISCELLANEOUS) ×2 IMPLANT
TUBING CIL FLEX 10 FLL-RA (TUBING) ×2 IMPLANT
WIRE HI TORQ VERSACORE-J 145CM (WIRE) ×1 IMPLANT

## 2020-04-12 NOTE — H&P (View-Only) (Signed)
Progress Note  Patient Name: Allison Duffy Date of Encounter: 04/12/2020  [facility] Cardiologist: Quay Burow, MD   Subjective   Still having 4-5/10 chest pressure.  No palpitation.  Breathing stable.  Inpatient Medications    Scheduled Meds: . alum & mag hydroxide-simeth  30 mL Oral TID AC & HS  . amLODipine  10 mg Oral Daily  . aspirin  324 mg Oral NOW   Or  . aspirin  300 mg Rectal NOW  . aspirin EC  81 mg Oral Daily  . carvedilol  12.5 mg Oral BID WC  . cholestyramine  4 g Oral Daily  . cloNIDine  0.1 mg Oral Daily  . cloNIDine  0.2 mg Oral QHS  . insulin aspart  0-5 Units Subcutaneous QHS  . insulin aspart  0-9 Units Subcutaneous TID WC  . irbesartan  300 mg Oral QHS  . pantoprazole  40 mg Oral Daily  . potassium chloride  20 mEq Oral Once  . rosuvastatin  20 mg Oral QHS  . sodium chloride flush  3 mL Intravenous Q12H   Continuous Infusions: . sodium chloride    . heparin 1,100 Units/hr (04/12/20 0732)  . magnesium sulfate bolus IVPB    . nitroGLYCERIN 25 mcg/min (04/12/20 0631)   PRN Meds: acetaminophen, hydrALAZINE, nitroGLYCERIN, ondansetron (ZOFRAN) IV, polyvinyl alcohol   Vital Signs    Vitals:   04/11/20 2058 04/11/20 2134 04/12/20 0126 04/12/20 0515  BP:  (!) 150/67 137/61 139/66  Pulse: (!) 59 60 (!) 59 (!) 58  Resp: 15 18 14 16   Temp:  98.2 F (36.8 C) 98 F (36.7 C) 98.2 F (36.8 C)  TempSrc:  Oral Oral Oral  SpO2: 93% 94% 93% 94%  Weight:  76.9 kg  76.9 kg  Height:        Intake/Output Summary (Last 24 hours) at 04/12/2020 0912 Last data filed at 04/12/2020 0745 Gross per 24 hour  Intake 201.32 ml  Output 1150 ml  Net -948.68 ml   Last 3 Weights 04/12/2020 04/11/2020 04/11/2020  Weight (lbs) 169 lb 9.6 oz 169 lb 9.6 oz 173 lb  Weight (kg) 76.93 kg 76.93 kg 78.472 kg      Telemetry    Atrial flutter with PVC- Personally Reviewed  ECG    Atrial flutter at variable slow rate- Personally Reviewed  Physical Exam    GEN: No acute distress.   Neck: No JVD Cardiac:  Irregular, 2/6 murmurs, rubs, or gallops.  Respiratory: Clear to auscultation bilaterally. GI: Soft, nontender, non-distended  MS: No edema; No deformity. Neuro:  Nonfocal  Psych: Normal affect   Labs    High Sensitivity Troponin:   Recent Labs  Lab 04/11/20 0955 04/11/20 1355 04/11/20 1746  TROPONINIHS 14 11 14       Chemistry Recent Labs  Lab 04/11/20 0955 04/11/20 1746 04/12/20 0538  NA 139  --  140  K 3.9  --  3.6  CL 107  --  106  CO2 20*  --  23  GLUCOSE 334*  --  153*  BUN 18  --  18  CREATININE 1.01*  --  1.08*  CALCIUM 9.2  --  9.1  PROT 7.4 6.8  --   ALBUMIN 3.8 3.5  --   AST 29 25  --   ALT 37 34  --   ALKPHOS 49 47  --   BILITOT 0.9 0.8  --   GFRNONAA 53*  --  48*  ANIONGAP 12  --  11     Hematology Recent Labs  Lab 04/11/20 0955 04/12/20 0538  WBC 7.4 9.2  RBC 4.09 3.55*  HGB 13.7 11.9*  HCT 40.5 34.3*  MCV 99.0 96.6  MCH 33.5 33.5  MCHC 33.8 34.7  RDW 11.8 11.8  PLT 205 206    Radiology    CT CORONARY MORPH W/CTA COR W/SCORE W/CA W/CM &/OR WO/CM  Addendum Date: 04/11/2020   ADDENDUM REPORT: 04/11/2020 18:30 CLINICAL DATA:  87 Year-old White Female EXAM: Cardiac/Coronary  CTA TECHNIQUE: The patient was scanned on a [employer]. FINDINGS: A 100 kV prospective scan was triggered in the descending thoracic aorta at 111 HU's. Axial non-contrast 3 mm slices were carried out through the heart. The data set was analyzed on a dedicated work station and scored using the [employer]. Gantry rotation speed was 250 msecs and collimation was .6 mm. No beta blockade and 0.8 mg of sl NTG was given. The 3D data set was reconstructed in 5% intervals of the 67-82 % of the R-R cycle. Diastolic phases were analyzed on a dedicated work station using MPR, MIP and VRT modes. The patient received 80 cc of contrast. Aorta:  Normal size.  Aortic atherosclerosis noted.  No dissection. Aortic Valve:   Tri-leaflet.  No calcifications. Mitral Valve: Severe Mitral Annular Calcification. Coronary Arteries:  Normal coronary origin.  Right dominance. Coronary calcium score of 2614. RCA is a large dominant artery that gives rise to PDA and PLA. There is a mild non obstructive (25-49%) calcified plaque in the proximal vessel. There is severe stenosis (70-99%) calcified plaque in the mid vessel, followed by a secondary mild non obstructive (25-49%) calcified plaque. There are multiple minimal non obstructive (1-24%) calcified plaques in the distal vessel. Left main is a large artery that gives rise to LAD and LCX arteries. There is a 33% ostial calcified plaque. There is a 20% distal calcified plaque. LAD is a large vessel that gives rise to two large diagonal branches. There is long length (31 mm) severe stenosis (70-99%) calcified plaque in the proximal vessel. There is a moderate stenosis (50-70%) calcified plaque in the mid vessel. There are tandem moderate stenoses (50-70%) calcified plaques in the mid D1 vessel. There is a moderate stenosis (50-70%) calcified plaque in the proximal D2 vessel. LCX is a non-dominant artery. There is a minimal non obstructive (1-24%) soft plaque in the distal vessel. There is a ramus intermedius with a minimal non obstructive (1-24%) mixed plaque in the proximal vessel. Other findings: Atypical pulmonary vein drainage- presence of right middle pulmonary vein. Normal left atrial appendage without a thrombus. Normal size of the pulmonary artery. Severe left atrial enlargement. Dilation of the coronary sinus: 14 mm. Extra-cardiac findings: See attached radiology report for non-cardiac structures. IMPRESSION: 1. Coronary calcium score of 2614. 2. Normal coronary origin.  Right dominance. 3. CAD-RADS 4 Severe stenosis. (70-99% or > 50% left main). Consider symptom-guided anti-ischemic pharmacotherapy as well as risk factor modification per guideline directed care. Additional analysis with  CT FFR will be submitted. 4. Aortic atherosclerosis noted. 5. Severe Mitral Annular Calcification. 6. Atypical pulmonary vein drainage- presence of right middle pulmonary vein. 7. Severe left atrial enlargement. 8. Dilation of the coronary sinus: 14 mm. Reaching out to primary cardiology team. Electronically Signed   By: Rudean Haskell MD   On: 04/11/2020 18:30   Result Date: 04/11/2020 EXAM: OVER-READ INTERPRETATION  CT CHEST The following report is an over-read performed by radiologist Dr. Vinnie Langton  of [facility], [place] on 04/11/2020. This over-read does not include interpretation of cardiac or coronary anatomy or pathology. The coronary calcium score/coronary CTA interpretation by the cardiologist is attached. COMPARISON:  None. FINDINGS: Aortic atherosclerosis. Within the visualized portions of the thorax there are no suspicious appearing pulmonary nodules or masses, there is no acute consolidative airspace disease, no pleural effusions, no pneumothorax and no lymphadenopathy. Visualized portions of the upper abdomen are unremarkable. There are no aggressive appearing lytic or blastic lesions noted in the visualized portions of the skeleton. IMPRESSION: 1.  Aortic Atherosclerosis (ICD10-I70.0). Electronically Signed: By: Vinnie Langton M.D. On: 04/11/2020 17:12   DG Chest Port 1 View  Result Date: 04/11/2020 CLINICAL DATA:  Chest pain, shortness of breath EXAM: PORTABLE CHEST 1 VIEW COMPARISON:  01/27/2019 FINDINGS: Heart and mediastinal contours are within normal limits. No focal opacities or effusions. No acute bony abnormality. Prior right shoulder replacement. Degenerative changes in the left shoulder. IMPRESSION: No active disease. Electronically Signed   By: Rolm Baptise M.D.   On: 04/11/2020 10:28    Cardiac Studies   Coronary CT 04/11/20 FINDINGS: A 100 kV prospective scan was triggered in the descending thoracic aorta at 111 HU's. Axial non-contrast 3 mm slices were  carried out through the heart. The data set was analyzed on a dedicated work station and scored using the [employer]. Gantry rotation speed was 250 msecs and collimation was .6 mm. No beta blockade and 0.8 mg of sl NTG was given. The 3D data set was reconstructed in 5% intervals of the 67-82 % of the R-R cycle. Diastolic phases were analyzed on a dedicated work station using MPR, MIP and VRT modes. The patient received 80 cc of contrast.  Aorta:  Normal size.  Aortic atherosclerosis noted.  No dissection.  Aortic Valve:  Tri-leaflet.  No calcifications.  Mitral Valve: Severe Mitral Annular Calcification.  Coronary Arteries:  Normal coronary origin.  Right dominance.  Coronary calcium score of 2614.  RCA is a large dominant artery that gives rise to PDA and PLA. There is a mild non obstructive (25-49%) calcified plaque in the proximal vessel. There is severe stenosis (70-99%) calcified plaque in the mid vessel, followed by a secondary mild non obstructive (25-49%) calcified plaque. There are multiple minimal non obstructive (1-24%) calcified plaques in the distal vessel.  Left main is a large artery that gives rise to LAD and LCX arteries. There is a 33% ostial calcified plaque. There is a 20% distal calcified plaque.  LAD is a large vessel that gives rise to two large diagonal branches. There is long length (31 mm) severe stenosis (70-99%) calcified plaque in the proximal vessel. There is a moderate stenosis (50-70%) calcified plaque in the mid vessel. There are tandem moderate stenoses (50-70%) calcified plaques in the mid D1 vessel. There is a moderate stenosis (50-70%) calcified plaque in the proximal D2 vessel.  LCX is a non-dominant artery. There is a minimal non obstructive (1-24%) soft plaque in the distal vessel.  There is a ramus intermedius with a minimal non obstructive (1-24%) mixed plaque in the proximal vessel.  Other findings:  Atypical  pulmonary vein drainage- presence of right middle pulmonary vein.  Normal left atrial appendage without a thrombus.  Normal size of the pulmonary artery.  Severe left atrial enlargement.  Dilation of the coronary sinus: 14 mm.  Extra-cardiac findings: See attached radiology report for non-cardiac structures.  IMPRESSION: 1. Coronary calcium score of 2614.  2. Normal coronary origin.  Right dominance.  3. CAD-RADS 4 Severe stenosis. (70-99% or > 50% left main). Consider symptom-guided anti-ischemic pharmacotherapy as well as risk factor modification per guideline directed care. Additional analysis with CT FFR will be submitted.  4. Aortic atherosclerosis noted.  5. Severe Mitral Annular Calcification.  6. Atypical pulmonary vein drainage- presence of right middle pulmonary vein.  7. Severe left atrial enlargement.  8. Dilation of the coronary sinus: 14 mm.  Reaching out to primary cardiology team.   Electronically Signed   By: Rudean Haskell MD   On: 04/11/2020 18:30   Patient Profile     87 y.o. female with a hx of angina, HTN, DM-2, HLD, Permanent AF, combined systolic and diastolic CHF, on anticoagulant, mod to severe MR who is being seen for the evaluation of chest pain.  Assessment & Plan    1.  Progressive/unstable angina -Presented with progressive worsening exertional angina which got worse after dancing on grandsons wedding over the weekend. -Troponin negative.  EKG without acute changes -Coronary CTA with high calcium score and evidence of CAD.  Pending FFR study -Patient still having substernal chest pressure on IV nitroglycerin>> will up titrate -At baseline patient is independent and very functional.  She lives by herself and does all household chores.  She also drives. -Discussed findings with daughter and patient at bedside.  Also reviewed with Dr. Ellyn Hack. -Patient took last dose of Eliquis 7 AM on 3/16.  Plan for cardiac  catheterization later this afternoon.  She only had 2 bites of breakfast. -She will be taken off DNR status during cardiac cath.  Patient and daughter agreed. - The patient understands that risks include but are not limited to stroke (1 in 1000), death (1 in 22), kidney failure [usually temporary] (1 in 500), bleeding (1 in 200), allergic reaction [possibly serious] (1 in 200), and agrees to proceed.   2.  Hypertension -Blood pressure elevated on admission -Normalized on current medication  3.  Persistent atrial flutter -Eliquis on hold  -On heparin for anticoagulation  4.  Moderate to severe MR on echo 01/2019 -Patient does not wishes to proceed with surgery  5. HLD - 04/12/2020: Cholesterol 124; HDL 37; LDL Cholesterol 56; Triglycerides 155; VLDL 31  -Pravachol changed to Crestor given evidence of CAD  6.  Diabetes mellitus -Sliding scale insulin while admitted  For questions or updates, please contact [place] Please consult [email or web address] for contact info under       Signed, Leanor Kail, PA  04/12/2020, 9:12 AM     ATTENDING ATTESTATION  I have seen, examined and evaluated the patient this morning along with Mr. Curly [place], [place].  After reviewing all the available data and chart, we discussed the patients laboratory, study & physical findings as well as symptoms in detail. I agree with his findings, examination as well as impression recommendations as per our discussion.     Unfortunately Ms. Pulver has continued to have chest pain now at rest.  Concerning for unstable angina. Coronary calcium score reviewed personally yesterday.  Very significant lesions noted in the LAD and RCA.  FFR still not done yet, however given the extent of disease and her ongoing symptoms it makes sense that this is clearly unstable anginal process.  My concern is that we may find surgical disease but were not able revascularized today.  Regardless after discussion with the patient and  daughter, I think a definitive answer is warranted.  Blood pressure looks better controlled, is on multiple different medications.  Still on nitroglycerin drip along with heparin.  Delene Loll was given over 24 hours ago, so should be okay for cardiac catheterization today.  Pending results of cath, I suspect that he may need to further titrate antihypertensives and antianginal's.  She is on carvedilol 12.5 mg twice daily, and resting heart rate is in the low 60s to 50s therefore not able to titrate further.  He is on 10 of amlodipine already. => Will convert from IV to oral nitrate, but next option would likely be Ranexa.  I had a long talk with the patient and her with both echocardiogram for this procedure and what the potential findings may be.  It is quite possible that we do a diagnostic only procedure today in order to get a game plan will be further.  She probably would not be a candidate for bypass surgery and I do not think that she herself would want bypass surgery.  This may lead to [place] making decisions about high risk PCI.  However the extent of symptoms she is having is a major driver decision making.  I will try to discuss with Dr. Gwenlyn Found who is her primary cardiologist.  Perhaps she does the cardiac catheterization and therefore can be involved in the decision-making.  He is in house today and regardless we will discuss with him.    Glenetta Hew, M.D., M.S. Interventional Cardiologist   Pager # [phone] Phone # [phone] [address] [place], [place] [address]

## 2020-04-12 NOTE — Consult Note (Addendum)
   [facility] CM Inpatient Consult   04/12/2020  Allison Duffy March 06, 1926 [id number]  [place]  Accountable Care Organization [ACO] Patient: [name]  Primary Care Provider: Burnard Bunting, MD, [facility], this office is listed to provide the Transition of Care [TOC] follow up.  Patient has been active with [facility] for chronic disease management services with a [place] [facility] for Diabetes Management.  Patient had been engaged however [facility] RN [facility] notes reveals difficulty maintaining contact with patient the last few months.    Plan: Following patient for disposition and needs. Will follow Inpatient Transition Of Care [TOC] team member to make aware that [facility] following.   Of note, [name] does not replace or interfere with any services that are needed or arranged by inpatient [facility] care management team.  For additional questions or referrals please contact:   Natividad Brood, RN BSN [facility] Liaison  [phone] business mobile phone Toll free office [phone]  Fax number: [phone] [email or web address]

## 2020-04-12 NOTE — Patient Outreach (Signed)
[facility]) Care Management  04/12/2020  Allison Duffy 09/14/1926 [id number]  Patient was admitted to the hospital 04/11/20. Nurse Health Coach will perform case closure and transfer patient to the [place] Team. Nurse has informed [facility] Coordinator of patient's admission.    Plan: RN Health Coach Discipline Closure Discipline Closure letter sent to PCP   Emelia Loron RN, [place] [phone] [email or web address]

## 2020-04-12 NOTE — Interval H&P Note (Signed)
Cath Lab Visit (complete for each Cath Lab visit)  Clinical Evaluation Leading to the Procedure:   ACS: Yes.    Non-ACS:    Anginal Classification: CCS III  Anti-ischemic medical therapy: Maximal Therapy (2 or more classes of medications)  Non-Invasive Test Results: No non-invasive testing performed  Prior CABG: No previous CABG      History and Physical Interval Note:  04/12/2020 4:09 PM  Allison Duffy  has presented today for surgery, with the diagnosis of unstable angina.  The various methods of treatment have been discussed with the patient and family. After consideration of risks, benefits and other options for treatment, the patient has consented to  Procedure(s): LEFT HEART CATH AND CORONARY ANGIOGRAPHY (N/A) as a surgical intervention.  The patient's history has been reviewed, patient examined, no change in status, stable for surgery.  I have reviewed the patient's chart and labs.  Questions were answered to the patient's satisfaction.     Quay Burow

## 2020-04-12 NOTE — Progress Notes (Signed)
[facility] for heparin Indication: chest pain/ACS  Allergies  Allergen Reactions  . Codeine Nausea And Vomiting  . Hydrocodone     Nausea and vomiting  . Sulfa Antibiotics Itching and Nausea Only    Patient Measurements: Height: 5\' 6"  (167.6 cm) Weight: 76.9 kg (169 lb 9.6 oz) IBW/kg (Calculated) : 59.3 Heparin Dosing Weight: 75 kg   Vital Signs: Temp: 98.2 F (36.8 C) (03/17 0515) Temp Source: Oral (03/17 0515) BP: 139/66 (03/17 0515) Pulse Rate: 58 (03/17 0515)  Labs: Recent Labs    04/11/20 0955 04/11/20 1355 04/11/20 1746 04/12/20 0538  HGB 13.7  --   --  11.9*  HCT 40.5  --   --  34.3*  PLT 205  --   --  206  APTT  --   --   --  57*  CREATININE 1.01*  --   --  1.08*  TROPONINIHS 14 11 14   --     Estimated Creatinine Clearance: 35.5 mL/min (A) (by C-G formula based on SCr of 1.08 mg/dL (H)).   Medical History: Past Medical History:  Diagnosis Date  . Acute on chronic combined systolic and diastolic CHF (congestive heart failure) ([place]) 01/28/2019  . Arthritis    "scattered joints" (07/29/2013)  . Chest pain   . Colitis   . Gout attack ?1980's  . Hyperlipidemia   . Hypertension   . PONV (postoperative nausea and vomiting)   . Type II diabetes mellitus (H[facility])     Medications:  Medications Prior to Admission  Medication Sig Dispense Refill Last Dose  . amLODipine (NORVASC) 10 MG tablet Take 10 mg by mouth at bedtime.   04/10/2020 at Unknown time  . apixaban (ELIQUIS) 2.5 MG TABS tablet Take 2.5 mg by mouth 2 (two) times daily.   04/11/2020 at Unknown time  . carboxymethylcellulose 1 % ophthalmic solution Place 1 drop into both eyes 2 (two) times daily.   04/11/2020 at Unknown time  . carvedilol (COREG) 12.5 MG tablet Take 12.5 mg by mouth 2 (two) times daily with a meal.   04/11/2020 at Unknown time  . cholecalciferol (VITAMIN D) 1000 UNITS tablet Take 1,000 Units by mouth daily with lunch.   04/10/2020 at Unknown time  .  cloNIDine (CATAPRES) 0.1 MG tablet Take 2 tablets(0.2 mg) in the morning and 1 tablet(0.1 mg) at bedtime (Patient taking differently: 0.1 mg 2 (two) times daily. Take 1 tablets(0.1 mg) in the morning and 2 tablets(0.2 mg) with supper) 270 tablet 3 04/11/2020  . furosemide (LASIX) 40 MG tablet Take 1 tablet (40 mg total) by mouth daily. Take 1 additional tablet daily if increased swelling, shortness of breath, or weight gain 5 lbs or more in 2 days (Patient taking differently: Take 40 mg by mouth daily.) 30 tablet 0 04/11/2020 at Unknown time  . glipiZIDE (GLUCOTROL) 10 MG tablet Take 10 mg by mouth daily before breakfast.   04/11/2020 at Unknown time  . metFORMIN (GLUCOPHAGE) 500 MG tablet Take 500 mg by mouth 2 (two) times daily with a meal.    04/11/2020 at Unknown time  . Multiple Vitamin (MULTIVITAMIN WITH MINERALS) TABS Take 1 tablet by mouth daily with supper.   04/10/2020 at Unknown time  . olmesartan (BENICAR) 40 MG tablet Take 40 mg by mouth at bedtime.   04/10/2020 at Unknown time  . omeprazole (PRILOSEC) 40 MG capsule Take 1 capsule (40 mg total) by mouth daily. (Patient taking differently: Take 40 mg by mouth daily  with supper.)   04/10/2020 at Unknown time  . potassium chloride SA (KLOR-CON) 20 MEQ tablet Take 20 mEq by mouth daily.   04/11/2020 at Unknown time  . pravastatin (PRAVACHOL) 20 MG tablet Take 20 mg by mouth at bedtime.   04/10/2020 at Unknown time  . Probiotic Product (PRO-BIOTIC BLEND) CAPS Take 1 capsule by mouth at bedtime. Takes OTC probiotic   04/10/2020 at Unknown time  . sitaGLIPtin (JANUVIA) 100 MG tablet Take 100 mg by mouth daily with lunch.   04/10/2020 at Unknown time  . Turmeric (QC TUMERIC COMPLEX PO) Take by mouth as needed. Patient reports takes OTC as needed     . White Petrolatum-Mineral Oil (ARTIFICIAL TEARS) ointment Place into both eyes as needed.   04/11/2020 at Unknown time  . cholestyramine (QUESTRAN) 4 g packet Take 4 g by mouth as needed.     Glory Rosebush ULTRA  test strip USE AS DIRECTED TO TEST BLOOD SUGAR TWICE DAILY       Assessment: 28 YOF who presented with chest pain. Patient has a h/o AFib on eliquis at home. Of note, last dose of Eliquis 3/16 in the morning.  Pharmacy consulted to dose IV heparin for ACS.  -aPTT= 57   Goal of Therapy:  Heparin level 0.3-0.7 units/ml aPTT 66-102 seconds Monitor platelets by anticoagulation protocol: Yes   Plan:  -Increase heparin to 1100 units/hr -aPTT and heparin level in 8 hours   Hildred Laser, PharmD Clinical Pharmacist **Pharmacist phone directory can now be found on [email or web address] ([email or web address]).  Listed under [place].

## 2020-04-12 NOTE — Progress Notes (Signed)
  Echocardiogram 2D Echocardiogram has been performed.  Allison Duffy 04/12/2020, 2:38 PM

## 2020-04-12 NOTE — Progress Notes (Signed)
Cath notes reviewed.  Will resume eliquis tonight and add imdur for now and wean and d/c the IV NTG.  She will need improved BP for discharge.

## 2020-04-12 NOTE — Progress Notes (Addendum)
Progress Note  Patient Name: Allison Duffy Date of Encounter: 04/12/2020  [facility] Cardiologist: Quay Burow, MD   Subjective   Still having 4-5/10 chest pressure.  No palpitation.  Breathing stable.  Inpatient Medications    Scheduled Meds: . alum & mag hydroxide-simeth  30 mL Oral TID AC & HS  . amLODipine  10 mg Oral Daily  . aspirin  324 mg Oral NOW   Or  . aspirin  300 mg Rectal NOW  . aspirin EC  81 mg Oral Daily  . carvedilol  12.5 mg Oral BID WC  . cholestyramine  4 g Oral Daily  . cloNIDine  0.1 mg Oral Daily  . cloNIDine  0.2 mg Oral QHS  . insulin aspart  0-5 Units Subcutaneous QHS  . insulin aspart  0-9 Units Subcutaneous TID WC  . irbesartan  300 mg Oral QHS  . pantoprazole  40 mg Oral Daily  . potassium chloride  20 mEq Oral Once  . rosuvastatin  20 mg Oral QHS  . sodium chloride flush  3 mL Intravenous Q12H   Continuous Infusions: . sodium chloride    . heparin 1,100 Units/hr (04/12/20 0732)  . magnesium sulfate bolus IVPB    . nitroGLYCERIN 25 mcg/min (04/12/20 0631)   PRN Meds: acetaminophen, hydrALAZINE, nitroGLYCERIN, ondansetron (ZOFRAN) IV, polyvinyl alcohol   Vital Signs    Vitals:   04/11/20 2058 04/11/20 2134 04/12/20 0126 04/12/20 0515  BP:  (!) 150/67 137/61 139/66  Pulse: (!) 59 60 (!) 59 (!) 58  Resp: 15 18 14 16   Temp:  98.2 F (36.8 C) 98 F (36.7 C) 98.2 F (36.8 C)  TempSrc:  Oral Oral Oral  SpO2: 93% 94% 93% 94%  Weight:  76.9 kg  76.9 kg  Height:        Intake/Output Summary (Last 24 hours) at 04/12/2020 0912 Last data filed at 04/12/2020 0745 Gross per 24 hour  Intake 201.32 ml  Output 1150 ml  Net -948.68 ml   Last 3 Weights 04/12/2020 04/11/2020 04/11/2020  Weight (lbs) 169 lb 9.6 oz 169 lb 9.6 oz 173 lb  Weight (kg) 76.93 kg 76.93 kg 78.472 kg      Telemetry    Atrial flutter with PVC- Personally Reviewed  ECG    Atrial flutter at variable slow rate- Personally Reviewed  Physical Exam    GEN: No acute distress.   Neck: No JVD Cardiac:  Irregular, 2/6 murmurs, rubs, or gallops.  Respiratory: Clear to auscultation bilaterally. GI: Soft, nontender, non-distended  MS: No edema; No deformity. Neuro:  Nonfocal  Psych: Normal affect   Labs    High Sensitivity Troponin:   Recent Labs  Lab 04/11/20 0955 04/11/20 1355 04/11/20 1746  TROPONINIHS 14 11 14       Chemistry Recent Labs  Lab 04/11/20 0955 04/11/20 1746 04/12/20 0538  NA 139  --  140  K 3.9  --  3.6  CL 107  --  106  CO2 20*  --  23  GLUCOSE 334*  --  153*  BUN 18  --  18  CREATININE 1.01*  --  1.08*  CALCIUM 9.2  --  9.1  PROT 7.4 6.8  --   ALBUMIN 3.8 3.5  --   AST 29 25  --   ALT 37 34  --   ALKPHOS 49 47  --   BILITOT 0.9 0.8  --   GFRNONAA 53*  --  48*  ANIONGAP 12  --  11     Hematology Recent Labs  Lab 04/11/20 0955 04/12/20 0538  WBC 7.4 9.2  RBC 4.09 3.55*  HGB 13.7 11.9*  HCT 40.5 34.3*  MCV 99.0 96.6  MCH 33.5 33.5  MCHC 33.8 34.7  RDW 11.8 11.8  PLT 205 206    Radiology    CT CORONARY MORPH W/CTA COR W/SCORE W/CA W/CM &/OR WO/CM  Addendum Date: 04/11/2020   ADDENDUM REPORT: 04/11/2020 18:30 CLINICAL DATA:  87 Year-old White Female EXAM: Cardiac/Coronary  CTA TECHNIQUE: The patient was scanned on a [employer]. FINDINGS: A 100 kV prospective scan was triggered in the descending thoracic aorta at 111 HU's. Axial non-contrast 3 mm slices were carried out through the heart. The data set was analyzed on a dedicated work station and scored using the [place]. Gantry rotation speed was 250 msecs and collimation was .6 mm. No beta blockade and 0.8 mg of sl NTG was given. The 3D data set was reconstructed in 5% intervals of the 67-82 % of the R-R cycle. Diastolic phases were analyzed on a dedicated work station using MPR, MIP and VRT modes. The patient received 80 cc of contrast. Aorta:  Normal size.  Aortic atherosclerosis noted.  No dissection. Aortic Valve:   Tri-leaflet.  No calcifications. Mitral Valve: Severe Mitral Annular Calcification. Coronary Arteries:  Normal coronary origin.  Right dominance. Coronary calcium score of 2614. RCA is a large dominant artery that gives rise to PDA and PLA. There is a mild non obstructive (25-49%) calcified plaque in the proximal vessel. There is severe stenosis (70-99%) calcified plaque in the mid vessel, followed by a secondary mild non obstructive (25-49%) calcified plaque. There are multiple minimal non obstructive (1-24%) calcified plaques in the distal vessel. Left main is a large artery that gives rise to LAD and LCX arteries. There is a 33% ostial calcified plaque. There is a 20% distal calcified plaque. LAD is a large vessel that gives rise to two large diagonal branches. There is long length (31 mm) severe stenosis (70-99%) calcified plaque in the proximal vessel. There is a moderate stenosis (50-70%) calcified plaque in the mid vessel. There are tandem moderate stenoses (50-70%) calcified plaques in the mid D1 vessel. There is a moderate stenosis (50-70%) calcified plaque in the proximal D2 vessel. LCX is a non-dominant artery. There is a minimal non obstructive (1-24%) soft plaque in the distal vessel. There is a ramus intermedius with a minimal non obstructive (1-24%) mixed plaque in the proximal vessel. Other findings: Atypical pulmonary vein drainage- presence of right middle pulmonary vein. Normal left atrial appendage without a thrombus. Normal size of the pulmonary artery. Severe left atrial enlargement. Dilation of the coronary sinus: 14 mm. Extra-cardiac findings: See attached radiology report for non-cardiac structures. IMPRESSION: 1. Coronary calcium score of 2614. 2. Normal coronary origin.  Right dominance. 3. CAD-RADS 4 Severe stenosis. (70-99% or > 50% left main). Consider symptom-guided anti-ischemic pharmacotherapy as well as risk factor modification per guideline directed care. Additional analysis with  CT FFR will be submitted. 4. Aortic atherosclerosis noted. 5. Severe Mitral Annular Calcification. 6. Atypical pulmonary vein drainage- presence of right middle pulmonary vein. 7. Severe left atrial enlargement. 8. Dilation of the coronary sinus: 14 mm. Reaching out to primary cardiology team. Electronically Signed   By: Rudean Haskell MD   On: 04/11/2020 18:30   Result Date: 04/11/2020 EXAM: OVER-READ INTERPRETATION  CT CHEST The following report is an over-read performed by radiologist Dr. Vinnie Langton  of [facility], [place] on 04/11/2020. This over-read does not include interpretation of cardiac or coronary anatomy or pathology. The coronary calcium score/coronary CTA interpretation by the cardiologist is attached. COMPARISON:  None. FINDINGS: Aortic atherosclerosis. Within the visualized portions of the thorax there are no suspicious appearing pulmonary nodules or masses, there is no acute consolidative airspace disease, no pleural effusions, no pneumothorax and no lymphadenopathy. Visualized portions of the upper abdomen are unremarkable. There are no aggressive appearing lytic or blastic lesions noted in the visualized portions of the skeleton. IMPRESSION: 1.  Aortic Atherosclerosis (ICD10-I70.0). Electronically Signed: By: Vinnie Langton M.D. On: 04/11/2020 17:12   DG Chest Port 1 View  Result Date: 04/11/2020 CLINICAL DATA:  Chest pain, shortness of breath EXAM: PORTABLE CHEST 1 VIEW COMPARISON:  01/27/2019 FINDINGS: Heart and mediastinal contours are within normal limits. No focal opacities or effusions. No acute bony abnormality. Prior right shoulder replacement. Degenerative changes in the left shoulder. IMPRESSION: No active disease. Electronically Signed   By: Rolm Baptise M.D.   On: 04/11/2020 10:28    Cardiac Studies   Coronary CT 04/11/20 FINDINGS: A 100 kV prospective scan was triggered in the descending thoracic aorta at 111 HU's. Axial non-contrast 3 mm slices were  carried out through the heart. The data set was analyzed on a dedicated work station and scored using the [place]. Gantry rotation speed was 250 msecs and collimation was .6 mm. No beta blockade and 0.8 mg of sl NTG was given. The 3D data set was reconstructed in 5% intervals of the 67-82 % of the R-R cycle. Diastolic phases were analyzed on a dedicated work station using MPR, MIP and VRT modes. The patient received 80 cc of contrast.  Aorta:  Normal size.  Aortic atherosclerosis noted.  No dissection.  Aortic Valve:  Tri-leaflet.  No calcifications.  Mitral Valve: Severe Mitral Annular Calcification.  Coronary Arteries:  Normal coronary origin.  Right dominance.  Coronary calcium score of 2614.  RCA is a large dominant artery that gives rise to PDA and PLA. There is a mild non obstructive (25-49%) calcified plaque in the proximal vessel. There is severe stenosis (70-99%) calcified plaque in the mid vessel, followed by a secondary mild non obstructive (25-49%) calcified plaque. There are multiple minimal non obstructive (1-24%) calcified plaques in the distal vessel.  Left main is a large artery that gives rise to LAD and LCX arteries. There is a 33% ostial calcified plaque. There is a 20% distal calcified plaque.  LAD is a large vessel that gives rise to two large diagonal branches. There is long length (31 mm) severe stenosis (70-99%) calcified plaque in the proximal vessel. There is a moderate stenosis (50-70%) calcified plaque in the mid vessel. There are tandem moderate stenoses (50-70%) calcified plaques in the mid D1 vessel. There is a moderate stenosis (50-70%) calcified plaque in the proximal D2 vessel.  LCX is a non-dominant artery. There is a minimal non obstructive (1-24%) soft plaque in the distal vessel.  There is a ramus intermedius with a minimal non obstructive (1-24%) mixed plaque in the proximal vessel.  Other findings:  Atypical  pulmonary vein drainage- presence of right middle pulmonary vein.  Normal left atrial appendage without a thrombus.  Normal size of the pulmonary artery.  Severe left atrial enlargement.  Dilation of the coronary sinus: 14 mm.  Extra-cardiac findings: See attached radiology report for non-cardiac structures.  IMPRESSION: 1. Coronary calcium score of 2614.  2. Normal coronary origin.  Right dominance.  3. CAD-RADS 4 Severe stenosis. (70-99% or > 50% left main). Consider symptom-guided anti-ischemic pharmacotherapy as well as risk factor modification per guideline directed care. Additional analysis with CT FFR will be submitted.  4. Aortic atherosclerosis noted.  5. Severe Mitral Annular Calcification.  6. Atypical pulmonary vein drainage- presence of right middle pulmonary vein.  7. Severe left atrial enlargement.  8. Dilation of the coronary sinus: 14 mm.  Reaching out to primary cardiology team.   Electronically Signed   By: Rudean Haskell MD   On: 04/11/2020 18:30   Patient Profile     87 y.o. female with a hx of angina, HTN, DM-2, HLD, Permanent AF, combined systolic and diastolic CHF, on anticoagulant, mod to severe MR who is being seen for the evaluation of chest pain.  Assessment & Plan    1.  Progressive/unstable angina -Presented with progressive worsening exertional angina which got worse after dancing on grandsons wedding over the weekend. -Troponin negative.  EKG without acute changes -Coronary CTA with high calcium score and evidence of CAD.  Pending FFR study -Patient still having substernal chest pressure on IV nitroglycerin>> will up titrate -At baseline patient is independent and very functional.  She lives by herself and does all household chores.  She also drives. -Discussed findings with daughter and patient at bedside.  Also reviewed with Dr. Ellyn Hack. -Patient took last dose of Eliquis 7 AM on 3/16.  Plan for cardiac  catheterization later this afternoon.  She only had 2 bites of breakfast. -She will be taken off DNR status during cardiac cath.  Patient and daughter agreed. - The patient understands that risks include but are not limited to stroke (1 in 1000), death (1 in 1), kidney failure [usually temporary] (1 in 500), bleeding (1 in 200), allergic reaction [possibly serious] (1 in 200), and agrees to proceed.   2.  Hypertension -Blood pressure elevated on admission -Normalized on current medication  3.  Persistent atrial flutter -Eliquis on hold  -On heparin for anticoagulation  4.  Moderate to severe MR on echo 01/2019 -Patient does not wishes to proceed with surgery  5. HLD - 04/12/2020: Cholesterol 124; HDL 37; LDL Cholesterol 56; Triglycerides 155; VLDL 31  -Pravachol changed to Crestor given evidence of CAD  6.  Diabetes mellitus -Sliding scale insulin while admitted  For questions or updates, please contact [place] Please consult [email or web address] for contact info under       Signed, Leanor Kail, PA  04/12/2020, 9:12 AM     ATTENDING ATTESTATION  I have seen, examined and evaluated the patient this morning along with Mr. Curly [place], [place].  After reviewing all the available data and chart, we discussed the patients laboratory, study & physical findings as well as symptoms in detail. I agree with his findings, examination as well as impression recommendations as per our discussion.     Unfortunately Ms. Rossetti has continued to have chest pain now at rest.  Concerning for unstable angina. Coronary calcium score reviewed personally yesterday.  Very significant lesions noted in the LAD and RCA.  FFR still not done yet, however given the extent of disease and her ongoing symptoms it makes sense that this is clearly unstable anginal process.  My concern is that we may find surgical disease but were not able revascularized today.  Regardless after discussion with the patient and  daughter, I think a definitive answer is warranted.  Blood pressure looks better controlled, is on multiple different medications.  Still on nitroglycerin drip along with heparin.  Delene Loll was given over 24 hours ago, so should be okay for cardiac catheterization today.  Pending results of cath, I suspect that he may need to further titrate antihypertensives and antianginal's.  She is on carvedilol 12.5 mg twice daily, and resting heart rate is in the low 60s to 50s therefore not able to titrate further.  He is on 10 of amlodipine already. => Will convert from IV to oral nitrate, but next option would likely be Ranexa.  I had a long talk with the patient and her with both echocardiogram for this procedure and what the potential findings may be.  It is quite possible that we do a diagnostic only procedure today in order to get a game plan will be further.  She probably would not be a candidate for bypass surgery and I do not think that she herself would want bypass surgery.  This may lead to [place] making decisions about high risk PCI.  However the extent of symptoms she is having is a major driver decision making.  I will try to discuss with Dr. Gwenlyn Found who is her primary cardiologist.  Perhaps she does the cardiac catheterization and therefore can be involved in the decision-making.  He is in house today and regardless we will discuss with him.    Glenetta Hew, M.D., M.S. Interventional Cardiologist   Pager # [phone] Phone # [phone] [address]. [place], [place] [address]

## 2020-04-13 ENCOUNTER — Encounter (HOSPITAL_COMMUNITY): Payer: Self-pay | Admitting: Cardiovascular Disease

## 2020-04-13 ENCOUNTER — Other Ambulatory Visit: Payer: Self-pay | Admitting: Cardiology

## 2020-04-13 DIAGNOSIS — I25119 Atherosclerotic heart disease of native coronary artery with unspecified angina pectoris: Secondary | ICD-10-CM

## 2020-04-13 DIAGNOSIS — E118 Type 2 diabetes mellitus with unspecified complications: Secondary | ICD-10-CM

## 2020-04-13 DIAGNOSIS — I2510 Atherosclerotic heart disease of native coronary artery without angina pectoris: Secondary | ICD-10-CM | POA: Clinically undetermined

## 2020-04-13 DIAGNOSIS — I5032 Chronic diastolic (congestive) heart failure: Secondary | ICD-10-CM

## 2020-04-13 LAB — BASIC METABOLIC PANEL
Anion gap: 10 (ref 5–15)
BUN: 12 mg/dL (ref 8–23)
CO2: 20 mmol/L — ABNORMAL LOW (ref 22–32)
Calcium: 8.3 mg/dL — ABNORMAL LOW (ref 8.9–10.3)
Chloride: 107 mmol/L (ref 98–111)
Creatinine, Ser: 0.94 mg/dL (ref 0.44–1.00)
GFR, Estimated: 57 mL/min — ABNORMAL LOW (ref >60)
Glucose, Bld: 176 mg/dL — ABNORMAL HIGH (ref 70–99)
Potassium: 3.9 mmol/L (ref 3.5–5.1)
Sodium: 137 mmol/L (ref 135–145)

## 2020-04-13 LAB — CBC
HCT: 31.7 % — ABNORMAL LOW (ref 36.0–46.0)
Hemoglobin: 11.4 g/dL — ABNORMAL LOW (ref 12.0–15.0)
MCH: 34.2 pg — ABNORMAL HIGH (ref 26.0–34.0)
MCHC: 36 g/dL (ref 30.0–36.0)
MCV: 95.2 fL (ref 80.0–100.0)
Platelets: 180 10*3/uL (ref 150–400)
RBC: 3.33 MIL/uL — ABNORMAL LOW (ref 3.87–5.11)
RDW: 11.8 % (ref 11.5–15.5)
WBC: 8.2 10*3/uL (ref 4.0–10.5)
nRBC: 0 % (ref 0.0–0.2)

## 2020-04-13 LAB — GLUCOSE, CAPILLARY
Glucose-Capillary: 174 mg/dL — ABNORMAL HIGH (ref 70–99)
Glucose-Capillary: 194 mg/dL — ABNORMAL HIGH (ref 70–99)

## 2020-04-13 LAB — MAGNESIUM: Magnesium: 1.8 mg/dL (ref 1.7–2.4)

## 2020-04-13 MED ORDER — APIXABAN 5 MG PO TABS: 5.0000 mg | ORAL_TABLET | Freq: Two times a day (BID) | ORAL | 11 refills | Status: DC

## 2020-04-13 MED ORDER — METFORMIN HCL 500 MG PO TABS: 500.0000 mg | ORAL_TABLET | Freq: Two times a day (BID) | ORAL | Status: DC

## 2020-04-13 MED ORDER — POTASSIUM CHLORIDE CRYS ER 20 MEQ PO TBCR: 20.0000 meq | EXTENDED_RELEASE_TABLET | Freq: Every day | ORAL | 11 refills | Status: DC

## 2020-04-13 MED ORDER — ASPIRIN EC 81 MG PO TBEC: 81.0000 mg | DELAYED_RELEASE_TABLET | Freq: Every day | ORAL | 2 refills | Status: AC

## 2020-04-13 MED ORDER — ROSUVASTATIN CALCIUM 20 MG PO TABS: 20.0000 mg | ORAL_TABLET | Freq: Every day | ORAL | 11 refills | Status: DC

## 2020-04-13 MED ORDER — CARVEDILOL 12.5 MG PO TABS: 12.5000 mg | ORAL_TABLET | Freq: Two times a day (BID) | ORAL | 11 refills | Status: DC

## 2020-04-13 MED ORDER — RANOLAZINE ER 500 MG PO TB12: 500.0000 mg | ORAL_TABLET | Freq: Two times a day (BID) | ORAL | 11 refills | Status: DC

## 2020-04-13 MED ORDER — OMEPRAZOLE 40 MG PO CPDR: 40.0000 mg | DELAYED_RELEASE_CAPSULE | Freq: Every day | ORAL | 11 refills | Status: DC

## 2020-04-13 MED ORDER — NITROGLYCERIN 0.4 MG SL SUBL: 0.4000 mg | SUBLINGUAL_TABLET | SUBLINGUAL | 1 refills | Status: DC | PRN

## 2020-04-13 NOTE — Progress Notes (Signed)
TR BAND REMOVAL  LOCATION:    right radial  DEFLATED PER PROTOCOL:    Yes.    TIME BAND OFF / DRESSING APPLIED:    0433   SITE UPON ARRIVAL:    Level 1  SITE AFTER BAND REMOVAL:    Level 1(sl.bruise)  CIRCULATION SENSATION AND MOVEMENT:    Within Normal Limits   Yes.    COMMENTS:   Pt.tolerated well.

## 2020-04-13 NOTE — Discharge Summary (Addendum)
Discharge Summary    Patient ID: ELZA VARRICCHIO MRN: [id number]; DOB: [birth year]  Admit date: 04/11/2020 Discharge date: 04/13/2020  PCP:  Burnard Bunting, MD   [place]  Cardiologist:  Quay Burow, MD  Advanced Practice Provider:  No care team member to display Electrophysiologist:  None   Discharge Diagnoses    Active Problems:   Essential hypertension   Hyperlipidemia associated with type 2 diabetes mellitus ([place])   Type 2 diabetes mellitus with complication, without long-term current use of insulin (HCC)   Chronic diastolic CHF (congestive heart failure) ([place])   Unstable angina ([place])   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 Beauchesne has noncritical CAD with normal filling pressures and normal LV function by 2D echo. There are no culprit lesions identified. Medical therapy will be recommended. The sheath was removed and a TR band was placed on the right wrist to achieve patent hemostasis. The patient left lab stable condition.  Quay Burow. MD, [facility] 04/12/2020 5:20 PM  Diagnostic Dominance: Right   Echo: 04/12/20  IMPRESSIONS    1. Left ventricular ejection fraction, by estimation, is 60 to 65%. The  left ventricle has normal function. The left ventricle has no regional  wall motion abnormalities. Left ventricular diastolic function could not  be evaluated.  2. Right ventricular systolic function is low normal. The right  ventricular size is normal. There is mildly elevated pulmonary artery  systolic pressure.  3. Left atrial size was moderately dilated.  4. The mitral valve is normal in structure. Mild mitral valve  regurgitation. No evidence of mitral stenosis.  5. The aortic valve is normal in structure. Aortic valve regurgitation is  not visualized. No aortic stenosis is  present.  _____________   History of Present Illness     TAWANNA FUNK is a 87 y.o. female with a hx of angina, HTN, DM-2, HLD, Permanent AF, combined systolic and diastolic CHF, on anticoagulant, mod to severe MR who presented with chest pain. She had a negative stress test in 2015. Echo from 01/2019 with normal EF, mildly increased LVH, moderate to severe MR and mildly elevated pulmonary artery pressure. Expressed to desire to pursue surgery.   Presented to the ED on 3/16 with chest pain which had been occurring for the past week prior to admission. Radiation into the jaw and left arm which improved with rest. EKG in the ED showed atrial flutter. Labs: Na 139, K+ 3.9, BUN 18, Cr 1.01 LFTs normal, Hs troponin 14, Hgb 13.7 WBC 7.4 plts 205. CXR no active disease. Given symptoms she was admitted for further management.    Hospital Course     1. Chest pain with elevated calcium score: presented with progressive exertional angina. Had a CCTA with elevated Ca+ score of 2000. Underwent cardiac cath with nonobstructive CAD in the pLAD of 50%, and 75% diag lesion. Recommendation for medical management. EF was normal on Echo. She was weaned from IV nitro => with no evidence of macrovascular disease to explain symptoms, felt to be more consistent with microvascular disease -- added Ranexa 500 mg twice daily -- provided prescription for as needed so no nitroglycerin for chest pain. Instructed per Dr. Ellyn Hack if she is hypertensive and having chest pain the recommendation would be to take her upcoming dose of clonidine along with a supplement nitroglycerin.  2. HTN: have been viable but  better controlled the morning of discharge -- will continue amlodipine 10mg  daily, coreg 12.5mg  BID, olmesartan 40mg  daily, clonidine 0.1mg  daily with 0.2mg  at bedtime  3. Persistent Aflutter: Eliquis has been resumed post cath. Was on 2.5mg  BID at home, but looks like she should be on 5mg  BID based on guidelines. This  was changed prior to discharge -- rates are controlled  4. HLD: Cholesterol 124; HDL 37; LDL Cholesterol 56; Triglycerides 155; VLDL 31  -- pravachol switched to Crestor 20mg  daily  -- FLP/LFTs in 8 weeks  5. DM: treated with SSI while in patient -- Hgb A1c 6.3 -- resumed on home medications at discharge   6. Moderate to severe MR: read as moderate to severe on echo 1/21 but now noted as mild on echo this admission. No signs of HF on exam  Recommendations for HHPT at discharge. Orders placed. She was educated by PharmD prior to discharge and received medications from [place].   Did the patient have an acute coronary syndrome (MI, NSTEMI, STEMI, etc) this admission?:  No.   The elevated Troponin was due to the acute medical illness (demand ischemia).      _____________  Discharge Vitals Blood pressure 125/67, pulse (!) 56, temperature 98.1 F (36.7 C), temperature source Oral, resp. rate 12, height 5\' 6"  (1.676 m), weight 84 kg, SpO2 96 %.  Filed Weights   04/11/20 2134 04/12/20 0515 04/13/20 0627  Weight: 76.9 kg 76.9 kg 84 kg    Labs & Radiologic Studies    CBC Recent Labs    04/11/20 0955 04/12/20 0538 04/13/20 0309  WBC 7.4 9.2 8.2  NEUTROABS 4.8  --   --   HGB 13.7 11.9* 11.4*  HCT 40.5 34.3* 31.7*  MCV 99.0 96.6 95.2  PLT 205 206 812   Basic Metabolic Panel Recent Labs    04/11/20 1746 04/12/20 0538 04/13/20 0309  NA  --  140 137  K  --  3.6 3.9  CL  --  106 107  CO2  --  23 20*  GLUCOSE  --  153* 176*  BUN  --  18 12  CREATININE  --  1.08* 0.94  CALCIUM  --  9.1 8.3*  MG 1.5*  --  1.8   Liver Function Tests Recent Labs    04/11/20 0955 04/11/20 1746  AST 29 25  ALT 37 34  ALKPHOS 49 47  BILITOT 0.9 0.8  PROT 7.4 6.8  ALBUMIN 3.8 3.5   No results for input(s): LIPASE, AMYLASE in the last 72 hours. High Sensitivity Troponin:   Recent Labs  Lab 04/11/20 0955 04/11/20 1355 04/11/20 1746  TROPONINIHS 14 11 14     BNP Invalid  input(s): POCBNP D-Dimer No results for input(s): DDIMER in the last 72 hours. Hemoglobin A1C Recent Labs    04/11/20 1746  HGBA1C 6.3*   Fasting Lipid Panel Recent Labs    04/12/20 0538  CHOL 124  HDL 37*  LDLCALC 56  TRIG 155*  CHOLHDL 3.4   Thyroid Function Tests Recent Labs    04/11/20 1746  TSH 1.089   _____________  CARDIAC CATHETERIZATION  Result Date: 04/12/2020  Prox LAD to Mid LAD lesion is 50% stenosed.  2nd Diag lesion is 75% stenosed.  Ramus lesion is 50% stenosed.  LAVEAH GLOSTER is a 87 y.o. female  [id number] LOCATION:  FACILITY: [facility] PHYSICIAN: Quay Burow, M.D. 01-May-1926 DATE OF PROCEDURE:  04/12/2020 DATE OF DISCHARGE: CARDIAC CATHETERIZATION History obtained from chart review.87 y.o.  female with a hx of angina, HTN, DM-2, HLD, Permanent AF, combined systolic and diastolic CHF, on anticoagulant, mod to severe MRwho is being seen for the evaluation of chest pain.  Her enzymes were normal.  Her 2D echo revealed normal LV systolic function.  She Gwenlyn Found was referred for diagnostic coronary angiography after a coronary CTA revealed a coronary calcium score of over 2000 significant CAD.   Ms. Harewood has noncritical CAD with normal filling pressures and normal LV function by 2D echo.  There are no culprit lesions identified.  Medical therapy will be recommended.  The sheath was removed and a TR band was placed on the right wrist to achieve patent hemostasis.  The patient left lab stable condition. Quay Burow. MD, [facility] 04/12/2020 5:20 PM   CT CORONARY MORPH W/CTA COR W/SCORE W/CA W/CM &/OR WO/CM  Addendum Date: 04/11/2020   ADDENDUM REPORT: 04/11/2020 18:30 CLINICAL DATA:  87 Year-old White Female EXAM: Cardiac/Coronary  CTA TECHNIQUE: The patient was scanned on a [employer]. FINDINGS: A 100 kV prospective scan was triggered in the descending thoracic aorta at 111 HU's. Axial non-contrast 3 mm slices were carried out through the heart. The data set  was analyzed on a dedicated work station and scored using the [place]. Gantry rotation speed was 250 msecs and collimation was .6 mm. No beta blockade and 0.8 mg of sl NTG was given. The 3D data set was reconstructed in 5% intervals of the 67-82 % of the R-R cycle. Diastolic phases were analyzed on a dedicated work station using MPR, MIP and VRT modes. The patient received 80 cc of contrast. Aorta:  Normal size.  Aortic atherosclerosis noted.  No dissection. Aortic Valve:  Tri-leaflet.  No calcifications. Mitral Valve: Severe Mitral Annular Calcification. Coronary Arteries:  Normal coronary origin.  Right dominance. Coronary calcium score of 2614. RCA is a large dominant artery that gives rise to PDA and PLA. There is a mild non obstructive (25-49%) calcified plaque in the proximal vessel. There is severe stenosis (70-99%) calcified plaque in the mid vessel, followed by a secondary mild non obstructive (25-49%) calcified plaque. There are multiple minimal non obstructive (1-24%) calcified plaques in the distal vessel. Left main is a large artery that gives rise to LAD and LCX arteries. There is a 33% ostial calcified plaque. There is a 20% distal calcified plaque. LAD is a large vessel that gives rise to two large diagonal branches. There is long length (31 mm) severe stenosis (70-99%) calcified plaque in the proximal vessel. There is a moderate stenosis (50-70%) calcified plaque in the mid vessel. There are tandem moderate stenoses (50-70%) calcified plaques in the mid D1 vessel. There is a moderate stenosis (50-70%) calcified plaque in the proximal D2 vessel. LCX is a non-dominant artery. There is a minimal non obstructive (1-24%) soft plaque in the distal vessel. There is a ramus intermedius with a minimal non obstructive (1-24%) mixed plaque in the proximal vessel. Other findings: Atypical pulmonary vein drainage- presence of right middle pulmonary vein. Normal left atrial appendage without a thrombus.  Normal size of the pulmonary artery. Severe left atrial enlargement. Dilation of the coronary sinus: 14 mm. Extra-cardiac findings: See attached radiology report for non-cardiac structures. IMPRESSION: 1. Coronary calcium score of 2614. 2. Normal coronary origin.  Right dominance. 3. CAD-RADS 4 Severe stenosis. (70-99% or > 50% left main). Consider symptom-guided anti-ischemic pharmacotherapy as well as risk factor modification per guideline directed care. Additional analysis with CT FFR will be submitted.  4. Aortic atherosclerosis noted. 5. Severe Mitral Annular Calcification. 6. Atypical pulmonary vein drainage- presence of right middle pulmonary vein. 7. Severe left atrial enlargement. 8. Dilation of the coronary sinus: 14 mm. Reaching out to primary cardiology team. Electronically Signed   By: Rudean Haskell MD   On: 04/11/2020 18:30   Result Date: 04/11/2020 EXAM: OVER-READ INTERPRETATION  CT CHEST The following report is an over-read performed by radiologist Dr. Vinnie Langton of [facility] [place] on 04/11/2020. This over-read does not include interpretation of cardiac or coronary anatomy or pathology. The coronary calcium score/coronary CTA interpretation by the cardiologist is attached. COMPARISON:  None. FINDINGS: Aortic atherosclerosis. Within the visualized portions of the thorax there are no suspicious appearing pulmonary nodules or masses, there is no acute consolidative airspace disease, no pleural effusions, no pneumothorax and no lymphadenopathy. Visualized portions of the upper abdomen are unremarkable. There are no aggressive appearing lytic or blastic lesions noted in the visualized portions of the skeleton. IMPRESSION: 1.  Aortic Atherosclerosis (ICD10-I70.0). Electronically Signed: By: Vinnie Langton M.D. On: 04/11/2020 17:12   DG Chest Port 1 View  Result Date: 04/11/2020 CLINICAL DATA:  Chest pain, shortness of breath EXAM: PORTABLE CHEST 1 VIEW COMPARISON:  01/27/2019  FINDINGS: Heart and mediastinal contours are within normal limits. No focal opacities or effusions. No acute bony abnormality. Prior right shoulder replacement. Degenerative changes in the left shoulder. IMPRESSION: No active disease. Electronically Signed   By: Rolm Baptise M.D.   On: 04/11/2020 10:28   ECHOCARDIOGRAM COMPLETE  Result Date: 04/12/2020    ECHOCARDIOGRAM REPORT   Patient Name:   LUNDEN STIEBER Date of Exam: 04/12/2020 Medical Rec #:  [id number]        Height:       66.0 in Accession #:    [id number]       Weight:       169.6 lb Date of Birth:  1926/05/29        BSA:          1.864 m Patient Age:    25 years         BP:           155/68 mmHg Patient Gender: F                HR:           57 bpm. Exam Location:  Inpatient Procedure: 2D Echo Indications:    Chest pain  History:        Patient has prior history of Echocardiogram examinations, most                 recent 01/28/2019. CHF, Arrythmias:Atrial Flutter; Risk                 Factors:Hypertension and Diabetes.  Sonographer:    Johny Chess Referring Phys: [place]  1. Left ventricular ejection fraction, by estimation, is 60 to 65%. The left ventricle has normal function. The left ventricle has no regional wall motion abnormalities. Left ventricular diastolic function could not be evaluated.  2. Right ventricular systolic function is low normal. The right ventricular size is normal. There is mildly elevated pulmonary artery systolic pressure.  3. Left atrial size was moderately dilated.  4. The mitral valve is normal in structure. Mild mitral valve regurgitation. No evidence of mitral stenosis.  5. The aortic valve is normal in structure. Aortic valve regurgitation is not visualized. No aortic stenosis is present. FINDINGS  Left Ventricle: Left ventricular ejection fraction, by estimation, is 60 to 65%. The left ventricle has normal function. The left ventricle has no regional wall motion abnormalities. The left  ventricular internal cavity size was normal in size. There is  no left ventricular hypertrophy. Left ventricular diastolic function could not be evaluated due to atrial fibrillation. Left ventricular diastolic function could not be evaluated. Right Ventricle: The right ventricular size is normal. No increase in right ventricular wall thickness. Right ventricular systolic function is low normal. There is mildly elevated pulmonary artery systolic pressure. The tricuspid regurgitant velocity is 2.90 m/s, and with an assumed right atrial pressure of 3 mmHg, the estimated right ventricular systolic pressure is 66.0 mmHg. Left Atrium: Left atrial size was moderately dilated. Right Atrium: Right atrial size was normal in size. Pericardium: There is no evidence of pericardial effusion. Mitral Valve: The mitral valve is normal in structure. Mild mitral valve regurgitation. No evidence of mitral valve stenosis. Tricuspid Valve: The tricuspid valve is grossly normal. Tricuspid valve regurgitation is trivial. Aortic Valve: The aortic valve is normal in structure. Aortic valve regurgitation is not visualized. No aortic stenosis is present. Pulmonic Valve: The pulmonic valve was grossly normal. Pulmonic valve regurgitation is not visualized. Aorta: The aortic root and ascending aorta are structurally normal, with no evidence of dilitation. IAS/Shunts: The atrial septum is grossly normal.  LEFT VENTRICLE PLAX 2D LVIDd:         4.80 cm  Diastology LVIDs:         3.00 cm  LV e' lateral:   13.30 cm/s LV PW:         1.20 cm  LV E/e' lateral: 7.6 LV IVS:        0.90 cm LVOT diam:     1.90 cm LV SV:         61 LV SV Index:   33 LVOT Area:     2.84 cm  RIGHT VENTRICLE             IVC RV S prime:     11.70 cm/s  IVC diam: 1.90 cm TAPSE (M-mode): 1.7 cm LEFT ATRIUM             Index       RIGHT ATRIUM           Index LA diam:        4.30 cm 2.31 cm/m  RA Area:     13.70 cm LA Vol (A2C):   75.3 ml 40.39 ml/m RA Volume:   30.20 ml  16.20  ml/m LA Vol (A4C):   74.4 ml 39.90 ml/m LA Biplane Vol: 79.7 ml 42.75 ml/m  AORTIC VALVE LVOT Vmax:   91.70 cm/s LVOT Vmean:  60.700 cm/s LVOT VTI:    0.216 m  AORTA Ao Root diam: 3.00 cm Ao Asc diam:  3.20 cm MITRAL VALVE                TRICUSPID VALVE MV Area (PHT): 2.66 cm     TR Peak grad:   33.6 mmHg MV Decel Time: 285 msec     TR Vmax:        290.00 cm/s MV E velocity: 101.00 cm/s MV A velocity: 29.10 cm/s   SHUNTS MV E/A ratio:  3.47         Systemic VTI:  0.22 m  Systemic Diam: 1.90 cm Mertie Moores MD Electronically signed by Mertie Moores MD Signature Date/Time: 04/12/2020/3:03:13 PM    Final    Disposition   Pt is being discharged home today in good condition.  Follow-up Plans & Appointments     Follow-up Information    [facility].   Specialty: [place] Why: Physical Therapy-office to call with visit times-If you need to contact the office please call them at [phone]       Deberah Pelton, NP Follow up on 05/02/2020.   Specialty: Cardiology Why: at 3:15pm for your follow up appt Contact information: [address] [place] [address] [phone]              Discharge Instructions    Diet - low sodium heart healthy   Complete by: As directed    Discharge instructions   Complete by: As directed    Radial Site Care Refer to this sheet in the next few weeks. These instructions provide you with information on caring for yourself after your procedure. Your caregiver may also give you more specific instructions. Your treatment has been planned according to current medical practices, but problems sometimes occur. Call your caregiver if you have any problems or questions after your procedure. HOME CARE INSTRUCTIONS You may shower the day after the procedure.Remove the bandage (dressing) and gently wash the site with plain soap and water.Gently pat the site dry.  Do not apply powder  or lotion to the site.  Do not submerge the affected site in water for 3 to 5 days.  Inspect the site at least twice daily.  Do not flex or bend the affected arm for 24 hours.  No lifting over 5 pounds (2.3 kg) for 5 days after your procedure.  Do not drive home if you are discharged the same day of the procedure. Have someone else drive you.  You may drive 24 hours after the procedure unless otherwise instructed by your caregiver.  What to expect: Any bruising will usually fade within 1 to 2 weeks.  Blood that collects in the tissue (hematoma) may be painful to the touch. It should usually decrease in size and tenderness within 1 to 2 weeks.  SEEK IMMEDIATE MEDICAL CARE IF: You have unusual pain at the radial site.  You have redness, warmth, swelling, or pain at the radial site.  You have drainage (other than a small amount of blood on the dressing).  You have chills.  You have a fever or persistent symptoms for more than 72 hours.  You have a fever and your symptoms suddenly get worse.  Your arm becomes pale, cool, tingly, or numb.  You have heavy bleeding from the site. Hold pressure on the site.   Increase activity slowly   Complete by: As directed       Discharge Medications   Allergies as of 04/13/2020      Reactions   Codeine Nausea And Vomiting   Hydrocodone    Nausea and vomiting   Sulfa Antibiotics Itching, Nausea Only      Medication List    STOP taking these medications   pravastatin 20 MG tablet Commonly known as: PRAVACHOL     TAKE these medications   amLODipine 10 MG tablet Commonly known as: NORVASC Take 10 mg by mouth at bedtime.   apixaban 5 MG Tabs tablet Commonly known as: ELIQUIS Take 1 tablet (5 mg total) by mouth 2 (two) times daily.  What changed:   medication strength  how much to take   artificial tears ointment Place into both eyes as needed.   aspirin EC 81 MG tablet Take 1 tablet (81 mg total) by mouth daily. Swallow whole.    carboxymethylcellulose 1 % ophthalmic solution Place 1 drop into both eyes 2 (two) times daily.   carvedilol 12.5 MG tablet Commonly known as: COREG Take 1 tablet (12.5 mg total) by mouth 2 (two) times daily with a meal.   cholecalciferol 25 MCG (1000 UNIT) tablet Commonly known as: VITAMIN D Take 1,000 Units by mouth daily with lunch.   cholestyramine 4 g packet Commonly known as: QUESTRAN Take 4 g by mouth as needed.   cloNIDine 0.1 MG tablet Commonly known as: CATAPRES Take 2 tablets(0.2 mg) in the morning and 1 tablet(0.1 mg) at bedtime What changed:   how much to take  when to take this  additional instructions   furosemide 40 MG tablet Commonly known as: Lasix Take 1 tablet (40 mg total) by mouth daily. Take 1 additional tablet daily if increased swelling, shortness of breath, or weight gain 5 lbs or more in 2 days What changed: additional instructions   glipiZIDE 10 MG tablet Commonly known as: GLUCOTROL Take 10 mg by mouth daily before breakfast.   metFORMIN 500 MG tablet Commonly known as: GLUCOPHAGE Take 1 tablet (500 mg total) by mouth 2 (two) times daily with a meal. Start taking on: April 15, 2020 What changed: These instructions start on April 15, 2020. If you are unsure what to do until then, ask your doctor or other care provider.   multivitamin with minerals Tabs tablet Take 1 tablet by mouth daily with supper.   nitroGLYCERIN 0.4 MG SL tablet Commonly known as: NITROSTAT Place 1 tablet (0.4 mg total) under the tongue every 5 (five) minutes x 3 doses as needed for chest pain.   olmesartan 40 MG tablet Commonly known as: BENICAR Take 40 mg by mouth at bedtime.   omeprazole 40 MG capsule Commonly known as: PRILOSEC Take 1 capsule (40 mg total) by mouth daily with supper.   OneTouch Ultra test strip Generic drug: glucose blood USE AS DIRECTED TO TEST BLOOD SUGAR TWICE DAILY   potassium chloride SA 20 MEQ tablet Commonly known as:  KLOR-CON Take 1 tablet (20 mEq total) by mouth daily.   Pro-biotic Blend Caps Take 1 capsule by mouth at bedtime. Takes OTC probiotic   QC TUMERIC COMPLEX PO Take by mouth as needed. Patient reports takes OTC as needed   ranolazine 500 MG 12 hr tablet Commonly known as: Ranexa Take 1 tablet (500 mg total) by mouth 2 (two) times daily.   rosuvastatin 20 MG tablet Commonly known as: CRESTOR Take 1 tablet (20 mg total) by mouth at bedtime.   sitaGLIPtin 100 MG tablet Commonly known as: JANUVIA Take 100 mg by mouth daily with lunch.          Outstanding Labs/Studies   FLP/LFTs in 8 weeks.   Duration of Discharge Encounter   Greater than 30 minutes including physician time.  Signed, Reino Bellis, NP 04/13/2020, 1:44 PM   ATTENDING ATTESTATION  I have seen, examined and evaluated the patient this AM along with Reino Bellis, NP-C.  After reviewing all the available data and chart, we discussed the patients laboratory, study & physical findings as well as symptoms in detail. I agree with her findings, examination as well as impression recommendations as per our discussion.  Attending adjustments noted in italics.   Quite surprisingly, her cardiac cath results were did not show obstructive disease either in the RCA or LAD.  There is a lesion in the LAD, but does not appear to be flow-limiting.  Unfortunately coronary CTA FFR results are still not available (could be because of the extent of calcification).  With no significant macrovascular disease, I suspect her symptoms are probably due to hypertension and microvascular disease.  Blood pressure look much better today we will wean her off nitroglycerin.  Initial thought was that he is tender, but I think for microvascular disease Ranexa may be a better option along with blood pressure management but the other medications.  Imdur will be the next option.  The daughter was concerned about what to do if these episodes  recur, suspect that when she has these episodes are related to stress or anxiety, she was on the PCP about anxiolytics.  I also think that when her blood pressure is high she gets microvascular ischemia exacerbation therefore I would suggest quick-acting options such as PRN nitroglycerin and clonidine to rapidly bring her pressure down to decrease afterload/wall stress as the main factor features that drive microvascular ischemia.   Glenetta Hew, M.D., M.S. Interventional Cardiologist   Pager # [phone] Phone # [phone] [address] [place], [place] [address]

## 2020-04-13 NOTE — TOC Initial Note (Signed)
Transition of Care [facility]) - Initial/Assessment Note    Patient Details  Name: Allison Duffy MRN: [id number] Date of Birth: 03/29/1926  Transition of Care [facility]) CM/SW Contact:    Bethena Roys, RN Phone Number: 04/13/2020, 1:55 PM  Clinical Narrative: Patient presented for unstable angina.Prior to arrival patient was from home and will return home with home health services. Case Manager spoke with patient and she has used [facility] in the past and wants to use them again. Referral provided to [place] and start of care to begin within 24-48 hours post transition home. No durable medical equipment needs at this time. Family will transport patient home via private vehicle.                   Expected Discharge Plan: [place] Barriers to Discharge: No Barriers Identified   Patient Goals and CMS Choice Patient states their goals for this hospitalization and ongoing recovery are:: to return home   Choice offered to / list presented to : NA (Patient had used [facility] in the past and wants to use again.)  Expected Discharge Plan and Services Expected Discharge Plan: [place] In-house Referral: NA Discharge Planning Services: CM Consult Post Acute Care Choice: [place] arrangements for the past 2 months: Single Family Home Expected Discharge Date: 04/13/20               DME Arranged: N/A         HH Arranged: PT HH Agency: [facility] ([facility]) Date HH Agency Contacted: 04/13/20 Time [place]: [id number] Representative spoke with at [place]: [place] Arrangements/Services Living arrangements for the past 2 months: [place] Lives with:: Relatives Patient language and need for interpreter reviewed:: Yes Do you feel safe going back to the place where you live?: Yes      Need for Family Participation in Patient Care: Yes (Comment) Care giver support system in place?: Yes  (comment)   Criminal Activity/Legal Involvement Pertinent to Current Situation/Hospitalization: No - Comment as needed  Activities of Daily Living      Permission Sought/Granted Permission sought to share information with : Case Manager,Facility Contact Representative,Family Supports Permission granted to share information with : Yes, Verbal Permission Granted     Permission granted to share info w AGENCY: [facility]        Emotional Assessment Appearance:: Appears stated age Attitude/Demeanor/Rapport: Engaged Affect (typically observed): Appropriate Orientation: : Oriented to Situation,Oriented to  Time,Oriented to Place,Oriented to Self Alcohol / Substance Use: Not Applicable Psych Involvement: No (comment)  Admission diagnosis:  Precordial pain [R07.2] Unstable angina ([place]) [I20.0] Chest pain at rest [R07.9] Patient Active Problem List   Diagnosis Date Noted  . 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 Unstable angina ([place]) 04/11/2020  . Mitral regurgitation 03/01/2019  . Chronic diastolic CHF (congestive heart failure) ([place]) 01/28/2019  . Atrial flutter by electrocardiogram ([place]) 01/28/2019  . Colitis, acute 05/31/2016  . Primary osteoarthritis of right shoulder 12/27/2014  . Pulmonary nodule 06/23/2014  . Essential hypertension 07/05/2013  . Hyperlipidemia associated with type 2 diabetes mellitus ([place]) 07/05/2013  . Type 2 diabetes mellitus with complication, without long-term current use of insulin ([place]) 07/05/2013   PCP:  Burnard Bunting, MD Pharmacy:   [facility], [place] [address] [place] [place] [address]  Phone: [phone] Fax: [place], [place] [address] [place] [address] Phone: [phone] Fax: [phone]   Readmission Risk  Interventions No flowsheet data found.

## 2020-04-13 NOTE — TOC Benefit Eligibility Note (Signed)
Patient Teacher, English as a foreign language completed.    The patient is currently admitted and upon discharge could be taking Ranolazine ER 500 .  The current 30 day co-pay is, $6.00.   The patient is insured through [place], [place] Patient Advocate Specialist [address] [address] Team Direct Number: [phone]  Fax: [phone]

## 2020-04-13 NOTE — Evaluation (Signed)
Physical Therapy Evaluation Patient Details Name: Allison Duffy MRN: [id number] DOB: 01/07/1927 Today's Date: 04/13/2020   History of Present Illness  Pt is a 87 y/o female admitted 3/16 secondary to increased chest pressure. Thought to be secondary to unstable angina and is s/p heart cath.  Clinical Impression  Pt admitted secondary to problem above with deficits below. Pt reporting feeling weaker than normal. Required min guard A for safety during ambulation with use of RW. Educated about using RW at home for increased safety. Reports grandkids and children can assist if needed at d/c. Feel she would benefit from HHPT/OT to maximize functional mobility independence and safety at home. Will continue to follow acutely.     Follow Up Recommendations Home health PT;Other (comment) (HHOT)    Equipment Recommendations  None recommended by PT    Recommendations for Other Services       Precautions / Restrictions Precautions Precautions: Fall Restrictions Weight Bearing Restrictions: No      Mobility  Bed Mobility               General bed mobility comments: In chair upon entry    Transfers Overall transfer level: Needs assistance Equipment used: Rolling walker (2 wheeled) Transfers: Sit to/from Stand Sit to Stand: Min guard         General transfer comment: Increased time to come to standing. Min guard for safety.  Ambulation/Gait Ambulation/Gait assistance: Min guard Gait Distance (Feet): 110 Feet Assistive device: Rolling walker (2 wheeled) Gait Pattern/deviations: Step-through pattern;Decreased stride length Gait velocity: Decreased   General Gait Details: Very slow, cautious gait. Pt reporting legs feel weaker than normal. Educated about using RW at home to increase safety.  Stairs            Wheelchair Mobility    Modified Rankin (Stroke Patients Only)       Balance Overall balance assessment: Needs assistance Sitting-balance support: No  upper extremity supported;Feet supported Sitting balance-Leahy Scale: Good     Standing balance support: Bilateral upper extremity supported;During functional activity Standing balance-Leahy Scale: Poor Standing balance comment: Reliant on BUE support                             Pertinent Vitals/Pain Pain Assessment: No/denies pain    Home Living Family/patient expects to be discharged to:: Private residence Living Arrangements: Alone Available Help at Discharge: Family Type of Home: House Home Access: Stairs to enter Entrance Stairs-Rails: Right;Left;Can reach both Technical brewer of Steps: 3 Home Layout: One level Home Equipment: Clinical cytogeneticist - 2 wheels;Cane - single point      Prior Function Level of Independence: Independent with assistive device(s)         Comments: Normally uses cane for ambulation     Hand Dominance        Extremity/Trunk Assessment   Upper Extremity Assessment Upper Extremity Assessment: Generalized weakness    Lower Extremity Assessment Lower Extremity Assessment: Generalized weakness    Cervical / Trunk Assessment Cervical / Trunk Assessment: Kyphotic  Communication   Communication: No difficulties  Cognition Arousal/Alertness: Awake/alert Behavior During Therapy: WFL for tasks assessed/performed Overall Cognitive Status: Within Functional Limits for tasks assessed                                        General Comments      Exercises  Assessment/Plan    PT Assessment Patient needs continued PT services  PT Problem List Decreased strength;Decreased balance;Decreased activity tolerance;Decreased mobility       PT Treatment Interventions DME instruction;Gait training;Functional mobility training;Therapeutic activities;Therapeutic exercise;Stair training;Balance training;Patient/family education    PT Goals (Current goals can be found in the Care Plan section)  Acute Rehab PT  Goals Patient Stated Goal: to go home PT Goal Formulation: With patient Time For Goal Achievement: 04/27/20 Potential to Achieve Goals: Good    Frequency Min 3X/week   Barriers to discharge        Co-evaluation               AM-PAC PT "6 Clicks" Mobility  Outcome Measure Help needed turning from your back to your side while in a flat bed without using bedrails?: None Help needed moving from lying on your back to sitting on the side of a flat bed without using bedrails?: None Help needed moving to and from a bed to a chair (including a wheelchair)?: A Little Help needed standing up from a chair using your arms (e.g., wheelchair or bedside chair)?: A Little Help needed to walk in hospital room?: A Little Help needed climbing 3-5 steps with a railing? : A Little 6 Click Score: 20    End of Session Equipment Utilized During Treatment: Gait belt Activity Tolerance: Patient tolerated treatment well Patient left: in chair;with call bell/phone within reach Nurse Communication: Mobility status PT Visit Diagnosis: Muscle weakness (generalized) (M62.81);Unsteadiness on feet (R26.81)    Time: 5427-0623 PT Time Calculation (min) (ACUTE ONLY): 21 min   Charges:   PT Evaluation $PT Eval Moderate Complexity: 1 Mod          Reuel Derby, PT, DPT  Acute Rehabilitation Services  Pager: [phone] Office: [phone]   Rudean Hitt 04/13/2020, 12:21 PM

## 2020-04-13 NOTE — Progress Notes (Addendum)
Progress Note  Patient Name: Allison Duffy Date of Encounter: 04/13/2020  [facility] Cardiologist: Allison Burow, MD   Subjective   Doing ok this morning. Still some shortness of breath, no chest pain.   Inpatient Medications    Scheduled Meds: . alum & mag hydroxide-simeth  30 mL Oral TID AC & HS  . amLODipine  10 mg Oral Daily  . apixaban  5 mg Oral BID  . aspirin  81 mg Oral Daily  . carvedilol  12.5 mg Oral BID WC  . cholestyramine  4 g Oral Daily  . cloNIDine  0.1 mg Oral Daily  . cloNIDine  0.2 mg Oral QHS  . insulin aspart  0-5 Units Subcutaneous QHS  . insulin aspart  0-9 Units Subcutaneous TID WC  . irbesartan  300 mg Oral QHS  . isosorbide mononitrate  60 mg Oral Daily  . pantoprazole  40 mg Oral Daily  . rosuvastatin  20 mg Oral QHS  . sodium chloride flush  3 mL Intravenous Q12H   Continuous Infusions: . sodium chloride    . sodium chloride    . nitroGLYCERIN Stopped (04/13/20 0551)   PRN Meds: sodium chloride, acetaminophen, hydrALAZINE, nitroGLYCERIN, ondansetron (ZOFRAN) IV, polyvinyl alcohol, sodium chloride flush   Vital Signs    Vitals:   04/12/20 1937 04/13/20 0029 04/13/20 0627 04/13/20 0758  BP: (!) 145/70 128/62 (!) 145/76 124/74  Pulse: 68 77 76 76  Resp: 14 (!) 21 15   Temp: 98.4 F (36.9 C) 100.1 F (37.8 C) 100.1 F (37.8 C)   TempSrc: Oral Oral Oral   SpO2: 93% 91% 92%   Weight:   84 kg   Height:        Intake/Output Summary (Last 24 hours) at 04/13/2020 0824 Last data filed at 04/13/2020 0806 Gross per 24 hour  Intake 3 ml  Output 1000 ml  Net -997 ml   Last 3 Weights 04/13/2020 04/12/2020 04/11/2020  Weight (lbs) 185 lb 3 oz 169 lb 9.6 oz 169 lb 9.6 oz  Weight (kg) 84 kg 76.93 kg 76.93 kg      Telemetry    Atrial flutter rate controlled - Personally Reviewed  ECG    Reads as SR but suspect 2:1 flutter - Personally Reviewed  Physical Exam   GEN: No acute distress.   Neck: No JVD Cardiac: Irreg Irreg, +  systolic murmur, no rubs, or gallops.  Respiratory: Clear to auscultation bilaterally. GI: Soft, nontender, non-distended  MS: No edema; No deformity. Neuro:  Nonfocal  Psych: Normal affect   Labs    High Sensitivity Troponin:   Recent Labs  Lab 04/11/20 0955 04/11/20 1355 04/11/20 1746  TROPONINIHS 14 11 14       Chemistry Recent Labs  Lab 04/11/20 0955 04/11/20 1746 04/12/20 0538 04/13/20 0309  NA 139  --  140 137  K 3.9  --  3.6 3.9  CL 107  --  106 107  CO2 20*  --  23 20*  GLUCOSE 334*  --  153* 176*  BUN 18  --  18 12  CREATININE 1.01*  --  1.08* 0.94  CALCIUM 9.2  --  9.1 8.3*  PROT 7.4 6.8  --   --   ALBUMIN 3.8 3.5  --   --   AST 29 25  --   --   ALT 37 34  --   --   ALKPHOS 49 47  --   --   BILITOT 0.9  0.8  --   --   GFRNONAA 53*  --  48* 57*  ANIONGAP 12  --  11 10     Hematology Recent Labs  Lab 04/11/20 0955 04/12/20 0538 04/13/20 0309  WBC 7.4 9.2 8.2  RBC 4.09 3.55* 3.33*  HGB 13.7 11.9* 11.4*  HCT 40.5 34.3* 31.7*  MCV 99.0 96.6 95.2  MCH 33.5 33.5 34.2*  MCHC 33.8 34.7 36.0  RDW 11.8 11.8 11.8  PLT 205 206 180    BNPNo results for input(s): BNP, PROBNP in the last 168 hours.   DDimer No results for input(s): DDIMER in the last 168 hours.   Radiology    CARDIAC CATHETERIZATION  Result Date: 04/12/2020  Prox LAD to Mid LAD lesion is 50% stenosed.  2nd Diag lesion is 75% stenosed.  Ramus lesion is 50% stenosed.  Allison Duffy is a 87 y.o. female  [id number] LOCATION:  FACILITY: [facility] PHYSICIAN: Allison Duffy, M.D. Allison Duffy DATE OF PROCEDURE:  04/12/2020 DATE OF DISCHARGE: CARDIAC CATHETERIZATION History obtained from chart review.87 y.o. female with a hx of angina, HTN, DM-2, HLD, Permanent AF, combined systolic and diastolic CHF, on anticoagulant, mod to severe MRwho is being seen for the evaluation of chest pain.  Her enzymes were normal.  Her 2D echo revealed normal LV systolic function.  She Allison Duffy was referred for diagnostic  coronary angiography after a coronary CTA revealed a coronary calcium score of over 2000 significant CAD.   [name] has noncritical CAD with normal filling pressures and normal LV function by 2D echo.  There are no culprit lesions identified.  Medical therapy will be recommended.  The sheath was removed and a TR band was placed on the right wrist to achieve patent hemostasis.  The patient left lab stable condition. Allison Duffy. MD, Allison Duffy 04/12/2020 5:20 PM   CT CORONARY MORPH W/CTA COR W/SCORE W/CA W/CM &/OR WO/CM  Addendum Date: 04/11/2020   ADDENDUM REPORT: 04/11/2020 18:30 CLINICAL DATA:  87 Year-old White Female EXAM: Cardiac/Coronary  CTA TECHNIQUE: The patient was scanned on a [employer]. FINDINGS: A 100 kV prospective scan was triggered in the descending thoracic aorta at 111 HU's. Axial non-contrast 3 mm slices were carried out through the heart. The data set was analyzed on a dedicated work station and scored using the [place]. Gantry rotation speed was 250 msecs and collimation was .6 mm. No beta blockade and 0.8 mg of sl NTG was given. The 3D data set was reconstructed in 5% intervals of the 67-82 % of the R-R cycle. Diastolic phases were analyzed on a dedicated work station using MPR, MIP and VRT modes. The patient received 80 cc of contrast. Aorta:  Normal size.  Aortic atherosclerosis noted.  No dissection. Aortic Valve:  Tri-leaflet.  No calcifications. Mitral Valve: Severe Mitral Annular Calcification. Coronary Arteries:  Normal coronary origin.  Right dominance. Coronary calcium score of 2614. RCA is a large dominant artery that gives rise to PDA and PLA. There is a mild non obstructive (25-49%) calcified plaque in the proximal vessel. There is severe stenosis (70-99%) calcified plaque in the mid vessel, followed by a secondary mild non obstructive (25-49%) calcified plaque. There are multiple minimal non obstructive (1-24%) calcified plaques in the distal vessel. Left  main is a large artery that gives rise to LAD and LCX arteries. There is a 33% ostial calcified plaque. There is a 20% distal calcified plaque. LAD is a large vessel that gives rise to two large diagonal  branches. There is long length (31 mm) severe stenosis (70-99%) calcified plaque in the proximal vessel. There is a moderate stenosis (50-70%) calcified plaque in the mid vessel. There are tandem moderate stenoses (50-70%) calcified plaques in the mid D1 vessel. There is a moderate stenosis (50-70%) calcified plaque in the proximal D2 vessel. LCX is a non-dominant artery. There is a minimal non obstructive (1-24%) soft plaque in the distal vessel. There is a ramus intermedius with a minimal non obstructive (1-24%) mixed plaque in the proximal vessel. Other findings: Atypical pulmonary vein drainage- presence of right middle pulmonary vein. Normal left atrial appendage without a thrombus. Normal size of the pulmonary artery. Severe left atrial enlargement. Dilation of the coronary sinus: 14 mm. Extra-cardiac findings: See attached radiology report for non-cardiac structures. IMPRESSION: 1. Coronary calcium score of 2614. 2. Normal coronary origin.  Right dominance. 3. CAD-RADS 4 Severe stenosis. (70-99% or > 50% left main). Consider symptom-guided anti-ischemic pharmacotherapy as well as risk factor modification per guideline directed care. Additional analysis with CT FFR will be submitted. 4. Aortic atherosclerosis noted. 5. Severe Mitral Annular Calcification. 6. Atypical pulmonary vein drainage- presence of right middle pulmonary vein. 7. Severe left atrial enlargement. 8. Dilation of the coronary sinus: 14 mm. Reaching out to primary cardiology team. Electronically Signed   By: Rudean Haskell MD   On: 04/11/2020 18:30   Result Date: 04/11/2020 EXAM: OVER-READ INTERPRETATION  CT CHEST The following report is an over-read performed by radiologist Dr. Vinnie Langton of [facility], [place] on  04/11/2020. This over-read does not include interpretation of cardiac or coronary anatomy or pathology. The coronary calcium score/coronary CTA interpretation by the cardiologist is attached. COMPARISON:  None. FINDINGS: Aortic atherosclerosis. Within the visualized portions of the thorax there are no suspicious appearing pulmonary nodules or masses, there is no acute consolidative airspace disease, no pleural effusions, no pneumothorax and no lymphadenopathy. Visualized portions of the upper abdomen are unremarkable. There are no aggressive appearing lytic or blastic lesions noted in the visualized portions of the skeleton. IMPRESSION: 1.  Aortic Atherosclerosis (ICD10-I70.0). Electronically Signed: By: Vinnie Langton M.D. On: 04/11/2020 17:12   DG Chest Port 1 View  Result Date: 04/11/2020 CLINICAL DATA:  Chest pain, shortness of breath EXAM: PORTABLE CHEST 1 VIEW COMPARISON:  01/27/2019 FINDINGS: Heart and mediastinal contours are within normal limits. No focal opacities or effusions. No acute bony abnormality. Prior right shoulder replacement. Degenerative changes in the left shoulder. IMPRESSION: No active disease. Electronically Signed   By: Rolm Baptise M.D.   On: 04/11/2020 10:28   ECHOCARDIOGRAM COMPLETE  Result Date: 04/12/2020    ECHOCARDIOGRAM REPORT   Patient Name:   Allison Duffy Date of Exam: 04/12/2020 Medical Rec #:  [id number]        Height:       66.0 in Accession #:    [id number]       Weight:       169.6 lb Date of Birth:  D[birth year]        BSA:          1.864 m Patient Age:    11 years         BP:           155/68 mmHg Patient Gender: F                HR:           57 bpm. Exam Location:  Inpatient Procedure: 2D Echo Indications:  Chest pain  History:        Patient has prior history of Echocardiogram examinations, most                 recent 01/28/2019. CHF, Arrythmias:Atrial Flutter; Risk                 Factors:Hypertension and Diabetes.  Sonographer:    Johny Chess Referring  Phys: [place]  1. Left ventricular ejection fraction, by estimation, is 60 to 65%. The left ventricle has normal function. The left ventricle has no regional wall motion abnormalities. Left ventricular diastolic function could not be evaluated.  2. Right ventricular systolic function is low normal. The right ventricular size is normal. There is mildly elevated pulmonary artery systolic pressure.  3. Left atrial size was moderately dilated.  4. The mitral valve is normal in structure. Mild mitral valve regurgitation. No evidence of mitral stenosis.  5. The aortic valve is normal in structure. Aortic valve regurgitation is not visualized. No aortic stenosis is present. FINDINGS  Left Ventricle: Left ventricular ejection fraction, by estimation, is 60 to 65%. The left ventricle has normal function. The left ventricle has no regional wall motion abnormalities. The left ventricular internal cavity size was normal in size. There is  no left ventricular hypertrophy. Left ventricular diastolic function could not be evaluated due to atrial fibrillation. Left ventricular diastolic function could not be evaluated. Right Ventricle: The right ventricular size is normal. No increase in right ventricular wall thickness. Right ventricular systolic function is low normal. There is mildly elevated pulmonary artery systolic pressure. The tricuspid regurgitant velocity is 2.90 m/s, and with an assumed right atrial pressure of 3 mmHg, the estimated right ventricular systolic pressure is 57.3 mmHg. Left Atrium: Left atrial size was moderately dilated. Right Atrium: Right atrial size was normal in size. Pericardium: There is no evidence of pericardial effusion. Mitral Valve: The mitral valve is normal in structure. Mild mitral valve regurgitation. No evidence of mitral valve stenosis. Tricuspid Valve: The tricuspid valve is grossly normal. Tricuspid valve regurgitation is trivial. Aortic Valve: The aortic valve is  normal in structure. Aortic valve regurgitation is not visualized. No aortic stenosis is present. Pulmonic Valve: The pulmonic valve was grossly normal. Pulmonic valve regurgitation is not visualized. Aorta: The aortic root and ascending aorta are structurally normal, with no evidence of dilitation. IAS/Shunts: The atrial septum is grossly normal.  LEFT VENTRICLE PLAX 2D LVIDd:         4.80 cm  Diastology LVIDs:         3.00 cm  LV e' lateral:   13.30 cm/s LV PW:         1.20 cm  LV E/e' lateral: 7.6 LV IVS:        0.90 cm LVOT diam:     1.90 cm LV SV:         61 LV SV Index:   33 LVOT Area:     2.84 cm  RIGHT VENTRICLE             IVC RV S prime:     11.70 cm/s  IVC diam: 1.90 cm TAPSE (M-mode): 1.7 cm LEFT ATRIUM             Index       RIGHT ATRIUM           Index LA diam:        4.30 cm 2.31 cm/m  RA Area:  13.70 cm LA Vol (A2C):   75.3 ml 40.39 ml/m RA Volume:   30.20 ml  16.20 ml/m LA Vol (A4C):   74.4 ml 39.90 ml/m LA Biplane Vol: 79.7 ml 42.75 ml/m  AORTIC VALVE LVOT Vmax:   91.70 cm/s LVOT Vmean:  60.700 cm/s LVOT VTI:    0.216 m  AORTA Ao Root diam: 3.00 cm Ao Asc diam:  3.20 cm MITRAL VALVE                TRICUSPID VALVE MV Area (PHT): 2.66 cm     TR Peak grad:   33.6 mmHg MV Decel Time: 285 msec     TR Vmax:        290.00 cm/s MV E velocity: 101.00 cm/s MV A velocity: 29.10 cm/s   SHUNTS MV E/A ratio:  3.47         Systemic VTI:  0.22 m                             Systemic Diam: 1.90 cm Mertie Moores MD Electronically signed by Mertie Moores MD Signature Date/Time: 04/12/2020/3:03:13 PM    Final     Cardiac Studies   Cath: 04/12/20  IMPRESSION: Ms. Petraglia has noncritical CAD with normal filling pressures and normal LV function by 2D echo.  There are no culprit lesions identified.  Medical therapy will be recommended.  The sheath was removed and a TR band was placed on the right wrist to achieve patent hemostasis.  The patient left lab stable condition.  Allison Duffy. MD,  [facility] 04/12/2020 5:20 PM  Diagnostic Dominance: Right   Echo: 04/12/20  IMPRESSIONS    1. Left ventricular ejection fraction, by estimation, is 60 to 65%. The  left ventricle has normal function. The left ventricle has no regional  wall motion abnormalities. Left ventricular diastolic function could not  be evaluated.  2. Right ventricular systolic function is low normal. The right  ventricular size is normal. There is mildly elevated pulmonary artery  systolic pressure.  3. Left atrial size was moderately dilated.  4. The mitral valve is normal in structure. Mild mitral valve  regurgitation. No evidence of mitral stenosis.  5. The aortic valve is normal in structure. Aortic valve regurgitation is  not visualized. No aortic stenosis is present.   Patient Profile     87 y.o. female with a hx of angina, HTN, DM-2, HLD, Permanent AF, combined systolic and diastolic CHF, on anticoagulant, mod to severe MRwho is being seen for the evaluation of chest pain.  Assessment & Plan    1. Chest pain with elevated calcium score: presented with progressive exertional angina. Had a CCTA with elevated Ca+ score of 2000. Underwent cardiac cath with nonobstructive CAD in the pLAD of 50%, and 75% diag lesion. Recommendation for medical management. EF was normal on Echo.   She was weaned from IV nitro => with no evidence of macrovascular disease to explain symptoms, will be more consistent with microvascular disease-we will add Ranexa 500 mg twice daily -- will have her ambulate this morning and possible discharge this afternoon => we have asked physical therapy to assess her stability for discharge home as she lives all alone.  We will provide prescription for as needed so no nitroglycerin for chest pain.  As was the case this most recent hospitalization, she usually is hypertensive.  If indeed she is having an episode where she is hypertensive and having  chest pain the recommendation would be to  take her upcoming dose of clonidine along with a supplement nitroglycerin.  The most effective way of reducing her blood pressure which is most likely exacerbating her microvascular disease (NINOCA)   2. HTN: have been viable but better controlled this morning.  -- will continue amlodipine 10mg  daily, coreg 12.5mg  BID, avapro 300mg  daily, clonidine 0.1mg  daily with 0.2mg  at bedtime  3. Persistent Aflutter: Eliquis has been resumed post cath. Was on 2.5mg  BID at home, but looks like she should be on 5mg  BID based on guidelines. -- rates are controlled  4. HLD: Cholesterol 124; HDL 37; LDL Cholesterol 56; Triglycerides 155; VLDL 31   -- pravachol switched to Crestor 20mg  daily   5. DM: treated with SSI while in patient -- plan to resume home medications at discharge   6. Moderate to severe MR: read as moderate to severe on echo 1/21 but now noted as mild on echo this admission. No signs of HF on exam.   Addendum: walked with RN and reported weakness, dizziness. Will consult PT as patient lives at home independently prior to admission  For questions or updates, please contact [facility] Please consult [email or web address] for contact info under        Signed, Reino Bellis, NP  04/13/2020, 8:24 AM    ATTENDING ATTESTATION  I have seen, examined and evaluated the patient this AM along with Reino Bellis, NP-C.  After reviewing all the available data and chart, we discussed the patients laboratory, study & physical findings as well as symptoms in detail. I agree with her findings, examination as well as impression recommendations as per our discussion.    Attending adjustments noted in italics.   Quite surprisingly, her cardiac cath results were did not show obstructive disease either in the RCA or LAD.  There is a lesion in the LAD, but does not appear to be flow-limiting.  Unfortunately coronary CTA FFR results are still not available (could be because of the extent of calcification).   With no significant macrovascular disease, I suspect her symptoms are probably due to hypertension and microvascular disease.  Blood pressure look much better today we will wean her off nitroglycerin.  Initial thought was that he is tender, but I think for microvascular disease Ranexa may be a better option along with blood pressure management but the other medications.  Imdur will be the next option.  The daughter was concerned about what to do if these episodes recur, suspect that when she has these episodes are related to stress or anxiety, she was on the PCP about anxiolytics.  I also think that when her blood pressure is high she gets microvascular ischemia exacerbation therefore I would suggest quick-acting options such as PRN nitroglycerin and clonidine to rapidly bring her pressure down to decrease afterload/wall stress as the main factor features that drive microvascular ischemia.   Glenetta Hew, M.D., M.S. Interventional Cardiologist   Pager # [phone] Phone # [phone] [address] [place], [place] [address]

## 2020-04-15 DIAGNOSIS — I4821 Permanent atrial fibrillation: Secondary | ICD-10-CM | POA: Diagnosis not present

## 2020-04-15 DIAGNOSIS — M109 Gout, unspecified: Secondary | ICD-10-CM | POA: Diagnosis not present

## 2020-04-15 DIAGNOSIS — Z90710 Acquired absence of both cervix and uterus: Secondary | ICD-10-CM | POA: Diagnosis not present

## 2020-04-15 DIAGNOSIS — Z9181 History of falling: Secondary | ICD-10-CM | POA: Diagnosis not present

## 2020-04-15 DIAGNOSIS — Z9089 Acquired absence of other organs: Secondary | ICD-10-CM | POA: Diagnosis not present

## 2020-04-15 DIAGNOSIS — I5043 Acute on chronic combined systolic (congestive) and diastolic (congestive) heart failure: Secondary | ICD-10-CM | POA: Diagnosis not present

## 2020-04-15 DIAGNOSIS — Z9049 Acquired absence of other specified parts of digestive tract: Secondary | ICD-10-CM | POA: Diagnosis not present

## 2020-04-15 DIAGNOSIS — Z961 Presence of intraocular lens: Secondary | ICD-10-CM | POA: Diagnosis not present

## 2020-04-15 DIAGNOSIS — I051 Rheumatic mitral insufficiency: Secondary | ICD-10-CM | POA: Diagnosis not present

## 2020-04-15 DIAGNOSIS — E785 Hyperlipidemia, unspecified: Secondary | ICD-10-CM | POA: Diagnosis not present

## 2020-04-15 DIAGNOSIS — Z7901 Long term (current) use of anticoagulants: Secondary | ICD-10-CM | POA: Diagnosis not present

## 2020-04-15 DIAGNOSIS — K529 Noninfective gastroenteritis and colitis, unspecified: Secondary | ICD-10-CM | POA: Diagnosis not present

## 2020-04-15 DIAGNOSIS — M1990 Unspecified osteoarthritis, unspecified site: Secondary | ICD-10-CM | POA: Diagnosis not present

## 2020-04-15 DIAGNOSIS — I25110 Atherosclerotic heart disease of native coronary artery with unstable angina pectoris: Secondary | ICD-10-CM | POA: Diagnosis not present

## 2020-04-15 DIAGNOSIS — Z87891 Personal history of nicotine dependence: Secondary | ICD-10-CM | POA: Diagnosis not present

## 2020-04-15 DIAGNOSIS — Z7984 Long term (current) use of oral hypoglycemic drugs: Secondary | ICD-10-CM | POA: Diagnosis not present

## 2020-04-15 DIAGNOSIS — Z7982 Long term (current) use of aspirin: Secondary | ICD-10-CM | POA: Diagnosis not present

## 2020-04-15 DIAGNOSIS — I110 Hypertensive heart disease with heart failure: Secondary | ICD-10-CM | POA: Diagnosis not present

## 2020-04-15 DIAGNOSIS — E1169 Type 2 diabetes mellitus with other specified complication: Secondary | ICD-10-CM | POA: Diagnosis not present

## 2020-04-16 ENCOUNTER — Encounter: Payer: Self-pay | Admitting: *Deleted

## 2020-04-16 ENCOUNTER — Other Ambulatory Visit: Payer: Self-pay | Admitting: *Deleted

## 2020-04-16 NOTE — Patient Instructions (Signed)
Acute Coronary Syndrome Acute coronary syndrome (ACS) is a serious problem in which there is suddenly not enough blood and oxygen reaching the heart. ACS can result in chest pain or a heart attack. This condition is a medical emergency. If you have any symptoms of this condition, get help right away. What are the causes? This condition may be caused by:  A buildup of fat and cholesterol inside the arteries (atherosclerosis). This is the most common cause. The buildup (plaque) can cause blood vessels in the heart (coronary arteries) to become narrow or blocked, which reduces blood flow to the heart. Plaque can also break off and lead to a clot, which can block an artery and cause a heart attack or stroke.  Sudden tightening of the muscles around the coronary arteries (coronary spasm).  Tearing of a coronary artery (spontaneous coronary artery dissection).  Very low blood pressure (hypotension).  An abnormal heartbeat (arrhythmia).  Other medical conditions that cause a decrease of oxygen to the heart, such as anemiaorrespiratory failure.  Using cocaine or methamphetamine.   What increases the risk? The following factors may make you more likely to develop this condition:  Age. The risk for ACS increases as you get older.  History of chest pain, heart attack, peripheral artery disease, or stroke.  Having taken chemotherapy or immune-suppressing medicines.  Being female.  Family history of chest pain, heart disease, or stroke.  Smoking.  Not exercising enough.  Being overweight.  High cholesterol.  High blood pressure (hypertension).  Diabetes.  Excessive alcohol use. What are the signs or symptoms? Common symptoms of this condition include:  Chest pain. The pain may last a long time, or it may stop and come back (recur). It may feel like: ? Crushing or squeezing. ? Tightness, pressure, fullness, or heaviness.  Arm, neck, jaw, or back pain.  Heartburn or  indigestion.  Shortness of breath.  Nausea.  Sudden cold sweats.  Light-headedness.  Dizziness or passing out.  Tiredness (fatigue). Sometimes there are no symptoms. How is this diagnosed? This condition may be diagnosed based on:  Your medical history and symptoms.  Imaging tests, such as: ? An electrocardiogram (ECG). This measures the heart's electrical activity. ? X-rays. ? CT scan. ? A coronary angiogram. For this test, dye is injected into the heart arteries and then X-rays are taken. ? Myocardial perfusion imaging. This test shows how well blood flows through your heart muscle.  Blood tests. These may be repeated at certain time intervals.  Exercise stress testing.  Echocardiogram. This is a test that uses sound waves to produce detailed images of the heart. How is this treated? Treatment for this condition may include:  Oxygen therapy.  Medicines, such as: ? Antiplatelet medicines and blood-thinning medicines, such as aspirin. These help prevent blood clots. ? Medicine that dissolves any blood clots (fibrinolytic therapy). ? Blood pressure medicines. ? Nitroglycerin. This helps widen blood vessels to improve blood flow. ? Pain medicine. ? Cholesterol-lowering medicine.  Surgery, such as: ? Coronary angioplasty with stent placement. This involves placing a small piece of metal that looks like mesh or a spring into a narrow coronary artery. This widens the artery and keeps it open. ? Coronary artery bypass surgery. This involves taking a section of a blood vessel from a different part of your body and placing it on the blocked coronary artery to allow blood to flow around the blockage.  Cardiac rehabilitation. This is a program that includes exercise training, education, and counseling to help  you recover. Follow these instructions at home: Eating and drinking  Eat a heart-healthy diet that includes whole grains, fruits and vegetables, lean proteins, and  low-fat or nonfat dairy products.  Limit how much salt (sodium) you eat as told by your health care provider. Follow instructions from your health care provider about any other eating or drinking restrictions, such as limiting foods that are high in fat and processed sugars.  Use healthy cooking methods such as roasting, grilling, broiling, baking, poaching, steaming, or stir-frying.  Work with a dietitian to follow a heart-healthy eating plan. Medicines  Take over-the-counter and prescription medicines only as told by your health care provider.  Do not take these medicines unless your health care provider approves: ? Vitamin supplements that contain vitamin A or vitamin E. ? NSAIDs, such as ibuprofen, naproxen, or celecoxib. ? Hormone replacement therapy that contains estrogen.  If you are taking blood thinners: ? Talk with your health care provider before you take any medicines that contain aspirin or NSAIDs. These medicines increase your risk for dangerous bleeding. ? Take your medicine exactly as told, at the same time every day. ? Avoid activities that could cause injury or bruising, and follow instructions about how to prevent falls. ? Wear a medical alert bracelet, and carry a card that lists what medicines you take. Activity  Follow your cardiac rehabilitation program. Do exercises as told by your physical therapist.  Ask your health care provider what activities and exercises are safe for you. Follow his or her instructions about lifting, driving, or climbing stairs. Lifestyle  Do not use any products that contain nicotine or tobacco, such as cigarettes, e-cigarettes, and chewing tobacco. If you need help quitting, ask your health care provider.  Do not drink alcohol if your health care provider tells you not to drink.  If you drink alcohol: ? Limit how much you have to 0-1 drink a day. ? Be aware of how much alcohol is in your drink. In the [place] one drink equals one 12 oz  bottle of beer (355 mL), one 5 oz glass of wine (148 mL), or one 1 oz glass of hard liquor (44 mL).  Maintain a healthy weight. If you need to lose weight, work with your health care provider to do so safely. General instructions  Tell all the health care providers who provide care for you about your heart condition, including your dentist. This may affect the medicines or treatment you receive.  Manage any other health conditions you have, such as hypertension or diabetes. These conditions affect your heart.  Pay attention to your mental health. You may be at higher risk for depression. ? Find ways to manage stress. ? Talk to your health care provider about depression screening and treatment.  Keep your vaccinations up to date. ? Get the flu shot (influenza vaccine) every year. ? Get the pneumococcal vaccine if you are age 71 or older.  If directed, monitor your blood pressure at home.  Keep all follow-up visits as told by your health care provider. This is important. Contact a health care provider if you:  Feel overwhelmed or sad.  Have trouble doing your daily activities. Get help right away if you:  Have pain in your chest, neck, arm, jaw, stomach, or back that recurs, and: ? It lasts for more than a few minutes. ? It is not relieved by taking the [place] health care provider prescribed.  Have unexplained: ? Heavy sweating. ? Heartburn or indigestion. ? Nausea  or vomiting. ? Shortness of breath. ? Difficulty breathing. ? Fatigue. ? Nervousness or anxiety. ? Weakness. ? Diarrhea. ? Dark stools or blood in your stool.  Have sudden light-headedness or dizziness.  Have blood pressure that is higher than 180/120.  Faint.  Have thoughts about hurting yourself. These symptoms may represent a serious problem that is an emergency. Do not wait to see if the symptoms will go away. Get medical help right away. Call your local emergency services (911 in the [place] Do  not drive yourself to the hospital.  Summary  Acute coronary syndrome (ACS) is when there is not enough blood and oxygen being supplied to the heart. ACS can result in chest pain or a heart attack.  Acute coronary syndrome is a medical emergency. If you have any symptoms of this condition, get help right away.  Treatment includes medicines and procedures to open the blocked arteries and restore blood flow. This information is not intended to replace advice given to you by your health care provider. Make sure you discuss any questions you have with your health care provider. Document Revised: 06/16/2018 Document Reviewed: 01/25/2018 Elsevier Patient Education  2021 [employer].

## 2020-04-16 NOTE — Patient Outreach (Signed)
[facility]) Care Management  [place]  04/16/2020   Allison Duffy 1926/05/10 [id number]   Referral Date: 3/21 Referral Source: Hospital liaison Referral Reason: Recent discharge from hospital with chest pain/heart cath Insurance: [employer]   Outreach attempt #1, successful.  Identity verified.  This care manager introduced self and stated purpose of call.  [facility] care management services explained.    Social: Report she lives alone, independent with ADL's but has been having friends and family helping her out with meals since discharge.  State she usually uses a cane only for ambulation but now using a walker as well until she is stronger.  Active with [facility] for PT, first visit was yesterday.  Feels she is recovering well, denies any current chest pain or discomfort.  Conditions: Per chart, has history of HTN, CHF, CAD, HLD, A-flutter, and DM (A1C - 6.3).  She monitors blood pressure, weight, and blood sugars daily.  State readings today were blood sugar 151 and weight 173.  Denies checking blood pressure today but state it was "good" yesterday.  Medications: Report she is compliant with medications however report her daughter is the one that manages them.  States she uses a pill box for administration.  Denies the need for financial assistance.  Appointments: Follow up was recommended for cardiology, visit scheduled for 4/6, daughter will provide transportation.  Will follow up with PCP in June for routine visit.  Encounter Medications:  Outpatient Encounter Medications as of 04/16/2020  Medication Sig  . amLODipine (NORVASC) 10 MG tablet Take 10 mg by mouth at bedtime.  Marland Kitchen apixaban (ELIQUIS) 5 MG TABS tablet Take 1 tablet (5 mg total) by mouth 2 (two) times daily.  Marland Kitchen aspirin EC 81 MG tablet Take 1 tablet (81 mg total) by mouth daily. Swallow whole.  . carboxymethylcellulose 1 % ophthalmic solution Place 1 drop into both eyes 2 (two) times daily.   . carvedilol (COREG) 12.5 MG tablet Take 1 tablet (12.5 mg total) by mouth 2 (two) times daily with a meal.  . cholecalciferol (VITAMIN D) 1000 UNITS tablet Take 1,000 Units by mouth daily with lunch.  . cholestyramine (QUESTRAN) 4 g packet Take 4 g by mouth as needed.  . cloNIDine (CATAPRES) 0.1 MG tablet Take 2 tablets(0.2 mg) in the morning and 1 tablet(0.1 mg) at bedtime (Patient taking differently: 0.1 mg 2 (two) times daily. Take 1 tablets(0.1 mg) in the morning and 2 tablets(0.2 mg) with supper)  . furosemide (LASIX) 40 MG tablet Take 1 tablet (40 mg total) by mouth daily. Take 1 additional tablet daily if increased swelling, shortness of breath, or weight gain 5 lbs or more in 2 days (Patient taking differently: Take 40 mg by mouth daily.)  . glipiZIDE (GLUCOTROL) 10 MG tablet Take 10 mg by mouth daily before breakfast.  . metFORMIN (GLUCOPHAGE) 500 MG tablet Take 1 tablet (500 mg total) by mouth 2 (two) times daily with a meal.  . Multiple Vitamin (MULTIVITAMIN WITH MINERALS) TABS Take 1 tablet by mouth daily with supper.  . nitroGLYCERIN (NITROSTAT) 0.4 MG SL tablet Place 1 tablet (0.4 mg total) under the tongue every 5 (five) minutes x 3 doses as needed for chest pain.  Marland Kitchen olmesartan (BENICAR) 40 MG tablet Take 40 mg by mouth at bedtime.  Marland Kitchen omeprazole (PRILOSEC) 40 MG capsule Take 1 capsule (40 mg total) by mouth daily with supper.  Glory Rosebush ULTRA test strip USE AS DIRECTED TO TEST BLOOD SUGAR TWICE  DAILY  . potassium chloride SA (KLOR-CON) 20 MEQ tablet Take 1 tablet (20 mEq total) by mouth daily.  . Probiotic Product (PRO-BIOTIC BLEND) CAPS Take 1 capsule by mouth at bedtime. Takes OTC probiotic  . ranolazine (RANEXA) 500 MG 12 hr tablet Take 1 tablet (500 mg total) by mouth 2 (two) times daily.  . rosuvastatin (CRESTOR) 20 MG tablet Take 1 tablet (20 mg total) by mouth at bedtime.  . sitaGLIPtin (JANUVIA) 100 MG tablet Take 100 mg by mouth daily with lunch.  . Turmeric (QC TUMERIC  COMPLEX PO) Take by mouth as needed. Patient reports takes OTC as needed  . White Petrolatum-Mineral Oil (ARTIFICIAL TEARS) ointment Place into both eyes as needed.   No facility-administered encounter medications on file as of 04/16/2020.    Functional Status:  In your present state of health, do you have any difficulty performing the following activities: 05/25/2019  Hearing? Y  Comment has hearing aids  Vision? N  Difficulty concentrating or making decisions? N  Walking or climbing stairs? N  Dressing or bathing? N  Doing errands, shopping? N  Preparing Food and eating ? N  Using the Toilet? N  In the past six months, have you accidently leaked urine? N  Do you have problems with loss of bowel control? Y  Comment occassionally due to colitis  Managing your Medications? Y  Comment daughter helps  Managing your Finances? Y  Comment daughter helps  Housekeeping or managing your Housekeeping? N  Comment daughter drives pt to doctor and difficult errands/ patient drives herself to grocery store  Some recent data might be hidden    Fall/Depression Screening: Fall Risk  04/16/2020 10/31/2019 09/05/2019  Falls in the past year? 0 0 0  Number falls in past yr: 0 0 0  Comment - - -  Injury with Fall? 0 0 0  Comment - - -  Risk for fall due to : - Impaired mobility;Impaired balance/gait Impaired mobility;Impaired balance/gait  Risk for fall due to: Comment - - -  Follow up - Falls prevention discussed;Falls evaluation completed;Education provided Falls prevention discussed;Education provided;Falls evaluation completed   PHQ 2/9 Scores 04/16/2020 10/31/2019 05/25/2019 02/02/2019  PHQ - 2 Score 0 0 0 0    Assessment:  Goals Addressed            This Visit's Progress   . [facility] - Make and Keep All Appointments       Timeframe:  Short-Term Goal Priority:  High Start Date:      3/21                       Expected End Date:      4/21                 Follow Up Date  4/4   - ask family  or friend for a ride - call to cancel if needed    Why is this important?    Part of staying healthy is seeing the doctor for follow-up care.   If you forget your appointments, there are some things you can do to stay on track.    Notes:      . COMPLETED: Track and Manage Fluids and Swelling       Timeframe:  Long-Range Goal Priority:  High Start Date: 10/31/19  Expected End Date: 07/26/20                    Follow Up Date 04/25/20   - call office if I gain more than 2 pounds in one day or 5 pounds in one week - keep legs up while sitting - track weight in diary - use salt in moderation - watch for swelling in feet, ankles and legs every day - weigh myself daily    Why is this important?   It is important to check your weight daily and watch how much salt and liquids you have.  It will help you to manage your heart failure.    Notes: Patient weighs daily and records the values. She does monitor the amount of sodium she consumes.   3/21 - Completed, focus changed to CAD    . Track and Manage My Blood Pressure   On track    Timeframe:  Long-Range Goal Priority:  High Start Date: 10/31/19                            Expected End Date: 07/26/20                      Follow Up Date  4/4   - check blood pressure 3 times per week - choose a place to take my blood pressure (home, clinic or office, retail store)    Why is this important?   You won't feel high blood pressure, but it can still hurt your blood vessels.  High blood pressure can cause heart or kidney problems. It can also cause a stroke.  Making lifestyle changes like losing a little weight or eating less salt will help.  Checking your blood pressure at home and at different times of the day can help to control blood pressure.  If the doctor prescribes medicine remember to take it the way the doctor ordered.  Call the office if you cannot afford the medicine or if there are questions about it.      Notes: Patient checks her B/P and records the values daily.   3/21 - Reminded of importance of self monitoring    . Track and Manage Symptoms   On track    Timeframe:  Long-Range Goal Priority:  High Start Date: 10/31/19                           Expected End Date: 07/26/20                     Follow Up Date 4/4   - begin a heart failure diary - develop a rescue plan - follow rescue plan if symptoms flare-up    Why is this important?   You will be able to handle your symptoms better if you keep track of them.  Making some simple changes to your lifestyle will help.  Eating healthy is one thing you can do to take good care of yourself.    Notes: Patient weighs daily, records the values, and will call her doctor as needed.  3/21 - Member educated on signs/symptoms of CAD       Plan:  Follow-up:  Patient agrees to Care Plan and Follow-up.  Will assign education regarding CAD management, including low salt diet.  Will follow up within the next 2 weeks.  Valente David, Therapist, sports, MSN  [place] [phone]

## 2020-04-17 DIAGNOSIS — E1169 Type 2 diabetes mellitus with other specified complication: Secondary | ICD-10-CM | POA: Diagnosis not present

## 2020-04-17 DIAGNOSIS — I5043 Acute on chronic combined systolic (congestive) and diastolic (congestive) heart failure: Secondary | ICD-10-CM | POA: Diagnosis not present

## 2020-04-17 DIAGNOSIS — I4821 Permanent atrial fibrillation: Secondary | ICD-10-CM | POA: Diagnosis not present

## 2020-04-17 DIAGNOSIS — I110 Hypertensive heart disease with heart failure: Secondary | ICD-10-CM | POA: Diagnosis not present

## 2020-04-17 DIAGNOSIS — I25110 Atherosclerotic heart disease of native coronary artery with unstable angina pectoris: Secondary | ICD-10-CM | POA: Diagnosis not present

## 2020-04-17 DIAGNOSIS — I051 Rheumatic mitral insufficiency: Secondary | ICD-10-CM | POA: Diagnosis not present

## 2020-04-23 DIAGNOSIS — I5043 Acute on chronic combined systolic (congestive) and diastolic (congestive) heart failure: Secondary | ICD-10-CM | POA: Diagnosis not present

## 2020-04-23 DIAGNOSIS — I4821 Permanent atrial fibrillation: Secondary | ICD-10-CM | POA: Diagnosis not present

## 2020-04-23 DIAGNOSIS — I051 Rheumatic mitral insufficiency: Secondary | ICD-10-CM | POA: Diagnosis not present

## 2020-04-23 DIAGNOSIS — I25110 Atherosclerotic heart disease of native coronary artery with unstable angina pectoris: Secondary | ICD-10-CM | POA: Diagnosis not present

## 2020-04-23 DIAGNOSIS — I110 Hypertensive heart disease with heart failure: Secondary | ICD-10-CM | POA: Diagnosis not present

## 2020-04-23 DIAGNOSIS — E1169 Type 2 diabetes mellitus with other specified complication: Secondary | ICD-10-CM | POA: Diagnosis not present

## 2020-04-25 DIAGNOSIS — I110 Hypertensive heart disease with heart failure: Secondary | ICD-10-CM | POA: Diagnosis not present

## 2020-04-25 DIAGNOSIS — I4821 Permanent atrial fibrillation: Secondary | ICD-10-CM | POA: Diagnosis not present

## 2020-04-25 DIAGNOSIS — I25110 Atherosclerotic heart disease of native coronary artery with unstable angina pectoris: Secondary | ICD-10-CM | POA: Diagnosis not present

## 2020-04-25 DIAGNOSIS — I5043 Acute on chronic combined systolic (congestive) and diastolic (congestive) heart failure: Secondary | ICD-10-CM | POA: Diagnosis not present

## 2020-04-25 DIAGNOSIS — I051 Rheumatic mitral insufficiency: Secondary | ICD-10-CM | POA: Diagnosis not present

## 2020-04-25 DIAGNOSIS — E1169 Type 2 diabetes mellitus with other specified complication: Secondary | ICD-10-CM | POA: Diagnosis not present

## 2020-04-30 ENCOUNTER — Other Ambulatory Visit: Payer: Self-pay | Admitting: *Deleted

## 2020-04-30 DIAGNOSIS — I110 Hypertensive heart disease with heart failure: Secondary | ICD-10-CM | POA: Diagnosis not present

## 2020-04-30 DIAGNOSIS — I051 Rheumatic mitral insufficiency: Secondary | ICD-10-CM | POA: Diagnosis not present

## 2020-04-30 DIAGNOSIS — I25110 Atherosclerotic heart disease of native coronary artery with unstable angina pectoris: Secondary | ICD-10-CM | POA: Diagnosis not present

## 2020-04-30 DIAGNOSIS — I5043 Acute on chronic combined systolic (congestive) and diastolic (congestive) heart failure: Secondary | ICD-10-CM | POA: Diagnosis not present

## 2020-04-30 DIAGNOSIS — E1169 Type 2 diabetes mellitus with other specified complication: Secondary | ICD-10-CM | POA: Diagnosis not present

## 2020-04-30 DIAGNOSIS — I4821 Permanent atrial fibrillation: Secondary | ICD-10-CM | POA: Diagnosis not present

## 2020-04-30 NOTE — Patient Outreach (Signed)
[facility]) Care Management  04/30/2020  Allison Duffy 1926/06/18 [id number]   Outgoing call placed to member to follow up on continued recovery from hospital admission. State she is improving, gaining strength and able to do more for herself now.  She continues to work with home health for PT/OT and will have follow up with cardiology on 4/6.  Daughter will provide transportation to appointment.  She is still monitoring her vitals, blood pressure 142/64, weight range 170-183, and blood sugar 152 today.  Denies any urgent concerns, encouraged to contact this care manager with questions.  Agrees to follow up within the next month.  Goals Addressed            This Visit's Progress   . [facility] - Make and Keep All Appointments   On track    Timeframe:  Short-Term Goal Priority:  High Start Date:      3/21                       Expected End Date:      4/21                 Follow Up Date  4/4   - ask family or friend for a ride - call to cancel if needed    Why is this important?    Part of staying healthy is seeing the doctor for follow-up care.   If you forget your appointments, there are some things you can do to stay on track.    Notes:      . [facility] - Track and Manage My Blood Pressure   On track    Timeframe:  Long-Range Goal Priority:  High Start Date: Restarted 3/21                         Expected End Date: 07/26/20                      Follow Up Date  4/4   - check blood pressure 3 times per week - choose a place to take my blood pressure (home, clinic or office, retail store)    Why is this important?   You won't feel high blood pressure, but it can still hurt your blood vessels.  High blood pressure can cause heart or kidney problems. It can also cause a stroke.  Making lifestyle changes like losing a little weight or eating less salt will help.  Checking your blood pressure at home and at different times of the day can help to control blood  pressure.  If the doctor prescribes medicine remember to take it the way the doctor ordered.  Call the office if you cannot afford the medicine or if there are questions about it.     Notes: Patient checks her B/P and records the values daily.   3/21 - Reminded of importance of self monitoring    . [facility] - Track and Manage Symptoms   On track    Timeframe:  Long-Range Goal Priority:  High Start Date: Restarted 3/21                         Expected End Date: 07/26/20                     Follow Up Date 4/4   - begin a heart failure diary -  develop a rescue plan - follow rescue plan if symptoms flare-up    Why is this important?   You will be able to handle your symptoms better if you keep track of them.  Making some simple changes to your lifestyle will help.  Eating healthy is one thing you can do to take good care of yourself.    Notes: Patient weighs daily, records the values, and will call her doctor as needed.  3/21 - Member educated on signs/symptoms of CAD      Valente David, Therapist, sports, MSN [place] [phone]

## 2020-05-01 NOTE — Progress Notes (Signed)
Cardiology Clinic Note   Patient Name: Allison Duffy Date of Encounter: 05/02/2020  Primary Care Provider:  Burnard Bunting, MD Primary Cardiologist:  Quay Burow, MD  Patient Profile    Allison Duffy 87 year old female presents to the clinic today for follow-up of her coronary artery disease status post cardiac catheterization 04/12/2020 which showed nonobstructive CAD 50% proximal LAD and 75% diagonal lesion.  Medical management was recommended.  Past Medical History    Past Medical History:  Diagnosis Date  . Acute on chronic combined systolic and diastolic CHF (congestive heart failure) ([place]) 01/28/2019  . Arthritis    "scattered joints" (07/29/2013)  . Chest pain   . Colitis   . Gout attack ?1980's  . Hyperlipidemia   . Hypertension   . PONV (postoperative nausea and vomiting)   . Type II diabetes mellitus ([place])    Past Surgical History:  Procedure Laterality Date  . APPENDECTOMY    . CARPAL TUNNEL RELEASE Right   . CATARACT EXTRACTION W/ INTRAOCULAR LENS  IMPLANT, BILATERAL Bilateral   . CHOLECYSTECTOMY    . KNEE ARTHROSCOPY Right   . LEFT HEART CATH AND CORONARY ANGIOGRAPHY N/A 04/12/2020   Procedure: LEFT HEART CATH AND CORONARY ANGIOGRAPHY;  Surgeon: Lorretta Harp, MD;  Location: [facility];  Service: Cardiovascular;  Laterality: N/A;  . OLECRANON BURSECTOMY Left 07/29/2013   "in ER"  . PARATHYROIDECTOMY    . TONSILLECTOMY    . TOTAL SHOULDER REPLACEMENT  2017  . VAGINAL HYSTERECTOMY      Allergies  Allergies  Allergen Reactions  . Codeine Nausea And Vomiting  . Hydrocodone     Nausea and vomiting  . Sulfa Antibiotics Itching and Nausea Only    History of Present Illness    Ms. Allison Duffy has a PMH of nonobstructive coronary artery disease, hypertension, persistent atrial flutter, hyperlipidemia, diabetes, and moderate-severe mitral regurgitation.  She presented to the emergency department on 04/11/2020 with chest pain that had been  occurring for the past week.  She noted radiation to her jaw and down her left arm which improved with rest.  Her EKG showed atrial flutter.  Her high-sensitivity troponins were 14.  Chest x-ray was negative for acute abnormalities.  Due to her symptoms she was admitted for further evaluation and management.    She underwent cardiac catheterization on 04/12/2020 which showed an EF of 60-65%, 50% proximal LAD, and 75% diagonal lesion.  Felt to be consistent with microvascular disease.  Ranexa was added to her medication regimen.  Medical management was recommended.  She had previously undergone a coronary CTA which showed a calcium score greater than 2000.  Her echocardiogram showed an LVEF of 60-65%, no significant regional wall motion abnormalities, normal RV function, moderately dilated left atria, and mild mitral valve regurgitation.  Her Eliquis was resumed post cath and her dose was increased from 2.5 twice daily to 5 mg twice daily.  Her rate was controlled.  She presents the clinic today for follow-up evaluation states she feels well today.  She has not had any chest pain since leaving the hospital.  She reports with her daughter who states that she is very physically active cleaning and doing housework.  She reports that she does not use any salt sugar or pepper on her food and is very strict with her diet.  We reviewed her angiography and Ranexa medication.  She is noted to have 2 skin lesions that are irregular and dark 1 located on her left  forearm and one located behind her right ear.  I will refer her to dermatology for further evaluation and treatment.  We will give her the salty 6 diet sheet, have her maintain her physical activity, and follow-up in 5 to 6 months.   Today she denies chest pain, shortness of breath, lower extremity edema, fatigue, palpitations, melena, hematuria, hemoptysis, diaphoresis, weakness, presyncope, syncope, orthopnea, and PND.   Home Medications    Prior to  Admission medications   Medication Sig Start Date End Date Taking? Authorizing Provider  amLODipine (NORVASC) 10 MG tablet Take 10 mg by mouth at bedtime.    [provider]  apixaban (ELIQUIS) 5 MG TABS tablet Take 1 tablet (5 mg total) by mouth 2 (two) times daily. 04/13/20   Leonie Man, MD  apixaban (ELIQUIS) 5 MG TABS tablet TAKE 1 TABLET BY MOUTH 2 TIMES DAILY 04/13/20 04/13/21  Leonie Man, MD  aspirin EC 81 MG tablet Take 1 tablet (81 mg total) by mouth daily. Swallow whole. 04/13/20 04/13/21  Leonie Man, MD  carboxymethylcellulose 1 % ophthalmic solution Place 1 drop into both eyes 2 (two) times daily.    [provider]  carvedilol (COREG) 12.5 MG tablet Take 1 tablet (12.5 mg total) by mouth 2 (two) times daily with a meal. 04/13/20   Leonie Man, MD  carvedilol (COREG) 12.5 MG tablet TAKE 1 TABLET (12.5 MG TOTAL) BY MOUTH TWO TIMES DAILY WITH A MEAL. 04/13/20 04/13/21  Leonie Man, MD  cholecalciferol (VITAMIN D) 1000 UNITS tablet Take 1,000 Units by mouth daily with lunch.    [provider]  cholestyramine (QUESTRAN) 4 g packet Take 4 g by mouth as needed.    [provider]  cloNIDine (CATAPRES) 0.1 MG tablet Take 2 tablets(0.2 mg) in the morning and 1 tablet(0.1 mg) at bedtime Patient taking differently: 0.1 mg 2 (two) times daily. Take 1 tablets(0.1 mg) in the morning and 2 tablets(0.2 mg) with supper 06/15/19   Lendon Colonel, NP  furosemide (LASIX) 40 MG tablet Take 1 tablet (40 mg total) by mouth daily. Take 1 additional tablet daily if increased swelling, shortness of breath, or weight gain 5 lbs or more in 2 days Patient taking differently: Take 40 mg by mouth daily. 01/29/19 01/29/20  Emeterio Reeve, DO  glipiZIDE (GLUCOTROL) 10 MG tablet Take 10 mg by mouth daily before breakfast.    Burnard Bunting, MD  metFORMIN (GLUCOPHAGE) 500 MG tablet Take 1 tablet (500 mg total) by mouth 2 (two) times daily with a meal. 04/15/20    Leonie Man, MD  Multiple Vitamin (MULTIVITAMIN WITH MINERALS) TABS Take 1 tablet by mouth daily with supper.    [provider]  nitroGLYCERIN (NITROSTAT) 0.4 MG SL tablet Place 1 tablet (0.4 mg total) under the tongue every 5 (five) minutes x 3 doses as needed for chest pain. 04/13/20   Leonie Man, MD  nitroGLYCERIN (NITROSTAT) 0.4 MG SL tablet PLACE 1 TABLET (0.4 MG TOTAL) UNDER THE TONGUE EVERY FIVE MINUTES X 3 DOSES AS NEEDED FOR CHEST PAIN. 04/13/20 04/13/21  Leonie Man, MD  olmesartan (BENICAR) 40 MG tablet Take 40 mg by mouth at bedtime.    Burnard Bunting, MD  omeprazole (PRILOSEC) 40 MG capsule Take 1 capsule (40 mg total) by mouth daily with supper. 04/13/20   Leonie Man, MD  omeprazole (PRILOSEC) 40 MG capsule TAKE 1 CAPSULE (40 MG TOTAL) BY MOUTH DAILY WITH SUPPER. 04/13/20 04/13/21  Leonie Man, MD  [facility] ULTRA test strip USE AS DIRECTED TO TEST BLOOD SUGAR TWICE DAILY 06/29/19   [provider]  potassium chloride SA (KLOR-CON) 20 MEQ tablet Take 1 tablet (20 mEq total) by mouth daily. 04/13/20   Leonie Man, MD  potassium chloride SA (KLOR-CON) 20 MEQ tablet TAKE 1 TABLET (20 MEQ TOTAL) BY MOUTH DAILY. 04/13/20 04/13/21  Leonie Man, MD  Probiotic Product (PRO-BIOTIC BLEND) CAPS Take 1 capsule by mouth at bedtime. Takes OTC probiotic    Burnard Bunting, MD  ranolazine (RANEXA) 500 MG 12 hr tablet Take 1 tablet (500 mg total) by mouth 2 (two) times daily. 04/13/20   Leonie Man, MD  ranolazine (RANEXA) 500 MG 12 hr tablet TAKE 1 TABLET (500 MG TOTAL) BY MOUTH TWO TIMES DAILY. 04/13/20 04/13/21  Leonie Man, MD  rosuvastatin (CRESTOR) 20 MG tablet Take 1 tablet (20 mg total) by mouth at bedtime. 04/13/20   Leonie Man, MD  rosuvastatin (CRESTOR) 20 MG tablet TAKE 1 TABLET (20 MG TOTAL) BY MOUTH AT BEDTIME. 04/13/20 04/13/21  Leonie Man, MD  sitaGLIPtin (JANUVIA) 100 MG tablet Take 100 mg by mouth daily with lunch.     [provider]  Turmeric (QC TUMERIC COMPLEX PO) Take by mouth as needed. Patient reports takes OTC as needed    Burnard Bunting, MD  Jay Schlichter Oil (ARTIFICIAL TEARS) ointment Place into both eyes as needed.    Burnard Bunting, MD    Family History    Family History  Problem Relation Age of Onset  . Hypertension Father   . Heart disease Father   . Heart disease Mother   . Diabetes Mother   . Hypertension Mother   . Heart disease Brother        both brothers  . Diabetes Brother   . Hypertension Brother    She indicated that her mother is deceased. She indicated that her father is deceased.  Social History    Social History   Socioeconomic History  . Marital status: Widowed    Spouse name: 4 children and 3 living  . Number of children: 4  . Years of education: Not on file  . Highest education level: Not on file  Occupational History  . Occupation: Retired  Tobacco Use  . Smoking status: Former Smoker    Years: 5.00    Types: Cigarettes    Quit date: 07/05/1953    Years since quitting: 66.8  . Smokeless tobacco: Never Used  Vaping Use  . Vaping Use: Never used  Substance and Sexual Activity  . Alcohol use: No  . Drug use: No  . Sexual activity: Never  Other Topics Concern  . Not on file  Social History Narrative  . Not on file   Social Determinants of Health   Financial Resource Strain: Not on file  Food Insecurity: No Food Insecurity  . Worried About Charity fundraiser in the Last Year: Never true  . Ran Out of Food in the Last Year: Never true  Transportation Needs: No Transportation Needs  . Lack of Transportation (Medical): No  . Lack of Transportation (Non-Medical): No  Physical Activity: Not on file  Stress: Not on file  Social Connections: Not on file  Intimate Partner Violence: Not on file     Review of Systems    General:  No chills, fever, night sweats or weight changes.  Cardiovascular:  No chest pain, dyspnea on  exertion,  edema, orthopnea, palpitations, paroxysmal nocturnal dyspnea. Dermatological: No rash, lesions/masses Respiratory: No cough, dyspnea Urologic: No hematuria, dysuria Abdominal:   No nausea, vomiting, diarrhea, bright red blood per rectum, melena, or hematemesis Neurologic:  No visual changes, wkns, changes in mental status. All other systems reviewed and are otherwise negative except as noted above.  Physical Exam    VS:  BP (!) 163/68   Pulse 70   Ht 5\' 6"  (1.676 m)   Wt 172 lb 9.6 oz (78.3 kg)   SpO2 96%   BMI 27.86 kg/m  , BMI Body mass index is 27.86 kg/m. GEN: Well nourished, well developed, in no acute distress. HEENT: normal. Neck: Supple, no JVD, carotid bruits, or masses. Cardiac: RRR, no murmurs, rubs, or gallops. No clubbing, cyanosis, edema.  Radials/DP/PT 2+ and equal bilaterally.  Respiratory:  Respirations regular and unlabored, clear to auscultation bilaterally. GI: Soft, nontender, nondistended, BS + x 4. MS: no deformity or atrophy. Skin: warm and dry, no rash.  2 skin lesions noted one on her left forearm and one behind her right ear both dark with irregular borders. Neuro:  Strength and sensation are intact. Psych: Normal affect.  Accessory Clinical Findings    Recent Labs: 04/11/2020: ALT 34; TSH 1.089 04/13/2020: BUN 12; Creatinine, Ser 0.94; Hemoglobin 11.4; Magnesium 1.8; Platelets 180; Potassium 3.9; Sodium 137   Recent Lipid Panel    Component Value Date/Time   CHOL 124 04/12/2020 0538   TRIG 155 (H) 04/12/2020 0538   HDL 37 (L) 04/12/2020 0538   CHOLHDL 3.4 04/12/2020 0538   VLDL 31 04/12/2020 0538   LDLCALC 56 04/12/2020 0538    ECG personally reviewed by me today-none today. Echocardiogram 04/12/2020 IMPRESSIONS    1. Left ventricular ejection fraction, by estimation, is 60 to 65%. The  left ventricle has normal function. The left ventricle has no regional  wall motion abnormalities. Left ventricular diastolic function could not   be evaluated.  2. Right ventricular systolic function is low normal. The right  ventricular size is normal. There is mildly elevated pulmonary artery  systolic pressure.  3. Left atrial size was moderately dilated.  4. The mitral valve is normal in structure. Mild mitral valve  regurgitation. No evidence of mitral stenosis.  5. The aortic valve is normal in structure. Aortic valve regurgitation is  not visualized. No aortic stenosis is present.   Cardiac catheterization 04/12/2020   Prox LAD to Mid LAD lesion is 50% stenosed.  2nd Diag lesion is 75% stenosed.  Ramus lesion is 50% stenosed.   Allison Duffy is a 87 y.o. female    [id number] LOCATION:  FACILITY: [facility]  PHYSICIAN: Quay Burow, M.D. 28-Jul-1926   DATE OF PROCEDURE:  04/12/2020  DATE OF DISCHARGE:     CARDIAC CATHETERIZATION     History obtained from chart review.87 y.o.femalewith a hx of angina, HTN, DM-2, HLD, Permanent AF, combined systolic and diastolic CHF, on anticoagulant, mod to severe MRwho is being seen for the evaluation of chest pain.  Her enzymes were normal.  Her 2D echo revealed normal LV systolic function.  She Gwenlyn Found was referred for diagnostic coronary angiography after a coronary CTA revealed a coronary calcium score of over 2000 significant CAD.   IMPRESSION: Ms. Lea has noncritical CAD with normal filling pressures and normal LV function by 2D echo.  There are no culprit lesions identified.  Medical therapy will be recommended.  The sheath was removed and a TR band was placed on the right wrist  to achieve patent hemostasis.  The patient left lab stable condition.  Diagnostic Dominance: Right    Intervention    Assessment & Plan   1.  Nonobstructive coronary artery disease-underwent cardiac catheterization on 04/12/2020 which showed 50% proximal LAD and 75% diagonal lesion.  Medical management was recommended.  She was initiated on Ranexa.  It was felt that  her symptoms were related to microvascular disease. Continue Ranexa, amlodipine, aspirin, carvedilol, nitroglycerin, rosuvastatin Heart healthy low-sodium diet-salty 6 given Increase physical activity as tolerated  Essential hypertension-BP today 163/68.  Blood pressure at home 140s over 60s Continue amlodipine, carvedilol, olmesartan, clonidine Heart healthy low-sodium diet-salty 6 given Increase physical activity as tolerated  Hyperlipidemia-04/12/2020: Cholesterol 124; HDL 37; LDL Cholesterol 56; Triglycerides 155; VLDL 31 Continue rosuvastatin Heart healthy low-sodium diet-salty 6 given Increase physical activity as tolerated Repeat fasting lipid panel LFTs in 4 weeks.  Persistent atrial flutter-heart rate today 70.  Was on reduced dose of apixaban which was increased to standard dose based on guidelines in the hospital. Continue apixaban, carvedilol Heart healthy low-sodium diet-salty 6 given Increase physical activity as tolerated  Moderate-severe mitral regurgitation-no increased DOE or activity intolerance.  Echocardiogram showed mild mitral valve regurgitation and no evidence of mitral stenosis. Repeat echocardiogram when clinically indicated.  Diabetes mellitus-glucose 176 on 04/13/2020. Outpatient medications restarted at discharge Follows with PCP  Disposition: Follow-up with Dr. Gwenlyn Found in 5-6 months.   Jossie Ng. Eiza Canniff NP-C    05/02/2020, 3:32 PM [address] Office [phone] Fax [phone]  Notice: This dictation was prepared with Dragon dictation along with smaller phrase technology. Any transcriptional errors that result from this process are unintentional and may not be corrected upon review.  I spent 12 minutes examining this patient, reviewing medications, and using patient centered shared decision making involving her cardiac care.  Prior to her visit I spent greater than 20 minutes reviewing her past  medical history,  medications, and prior cardiac tests.

## 2020-05-02 ENCOUNTER — Ambulatory Visit (INDEPENDENT_AMBULATORY_CARE_PROVIDER_SITE_OTHER): Payer: Medicare Other | Admitting: General Practice

## 2020-05-02 ENCOUNTER — Encounter: Payer: Self-pay | Admitting: General Practice

## 2020-05-02 ENCOUNTER — Other Ambulatory Visit: Payer: Self-pay

## 2020-05-02 VITALS — BP 163/68 | HR 70 | Ht 66.0 in | Wt 172.6 lb

## 2020-05-02 DIAGNOSIS — I4892 Unspecified atrial flutter: Secondary | ICD-10-CM | POA: Diagnosis not present

## 2020-05-02 DIAGNOSIS — I2510 Atherosclerotic heart disease of native coronary artery without angina pectoris: Secondary | ICD-10-CM | POA: Diagnosis not present

## 2020-05-02 DIAGNOSIS — L989 Disorder of the skin and subcutaneous tissue, unspecified: Secondary | ICD-10-CM

## 2020-05-02 DIAGNOSIS — I340 Nonrheumatic mitral (valve) insufficiency: Secondary | ICD-10-CM | POA: Diagnosis not present

## 2020-05-02 DIAGNOSIS — E118 Type 2 diabetes mellitus with unspecified complications: Secondary | ICD-10-CM | POA: Diagnosis not present

## 2020-05-02 DIAGNOSIS — E785 Hyperlipidemia, unspecified: Secondary | ICD-10-CM

## 2020-05-02 DIAGNOSIS — I5043 Acute on chronic combined systolic (congestive) and diastolic (congestive) heart failure: Secondary | ICD-10-CM | POA: Diagnosis not present

## 2020-05-02 DIAGNOSIS — I25110 Atherosclerotic heart disease of native coronary artery with unstable angina pectoris: Secondary | ICD-10-CM | POA: Diagnosis not present

## 2020-05-02 DIAGNOSIS — E1169 Type 2 diabetes mellitus with other specified complication: Secondary | ICD-10-CM | POA: Diagnosis not present

## 2020-05-02 DIAGNOSIS — I4821 Permanent atrial fibrillation: Secondary | ICD-10-CM | POA: Diagnosis not present

## 2020-05-02 DIAGNOSIS — I110 Hypertensive heart disease with heart failure: Secondary | ICD-10-CM | POA: Diagnosis not present

## 2020-05-02 DIAGNOSIS — I10 Essential (primary) hypertension: Secondary | ICD-10-CM

## 2020-05-02 DIAGNOSIS — I051 Rheumatic mitral insufficiency: Secondary | ICD-10-CM | POA: Diagnosis not present

## 2020-05-02 NOTE — Patient Instructions (Signed)
Medication Instructions:  The current medical regimen is effective;  continue present plan and medications as directed. Please refer to the Current Medication list given to you today.  *If you need a refill on your cardiac medications before your next appointment, please call your pharmacy*  Lab Work:   Testing/Procedures:  NONE    NONE  Special Instructions REFERRAL TO DERMATOLOGY-SKIN LESION  PLEASE READ AND FOLLOW SALTY 6-ATTACHED-1,800mg  daily  Follow-Up: Your next appointment:  5-6 month(s) In Person with Quay Burow, MD OR IF UNAVAILABLE [place], FNP-C   At [facility], you and your health needs are our priority.  As part of our continuing mission to provide you with exceptional heart care, we have created designated Provider Care Teams.  These Care Teams include your primary Cardiologist (physician) and Advanced Practice Providers (APPs -  Physician Assistants and Nurse Practitioners) who all work together to provide you with the care you need, when you need it.            6 SALTY THINGS TO AVOID     1,800MG  DAILY

## 2020-05-03 ENCOUNTER — Telehealth: Payer: Self-pay | Admitting: General Practice

## 2020-05-03 NOTE — Telephone Encounter (Signed)
Left message for Brayton Layman (New Patient Referral Coordinator) at [facility] to call and discuss scheduling patient.

## 2020-05-07 DIAGNOSIS — I110 Hypertensive heart disease with heart failure: Secondary | ICD-10-CM | POA: Diagnosis not present

## 2020-05-07 DIAGNOSIS — I051 Rheumatic mitral insufficiency: Secondary | ICD-10-CM | POA: Diagnosis not present

## 2020-05-07 DIAGNOSIS — E1169 Type 2 diabetes mellitus with other specified complication: Secondary | ICD-10-CM | POA: Diagnosis not present

## 2020-05-07 DIAGNOSIS — I4821 Permanent atrial fibrillation: Secondary | ICD-10-CM | POA: Diagnosis not present

## 2020-05-07 DIAGNOSIS — I5043 Acute on chronic combined systolic (congestive) and diastolic (congestive) heart failure: Secondary | ICD-10-CM | POA: Diagnosis not present

## 2020-05-07 DIAGNOSIS — I25110 Atherosclerotic heart disease of native coronary artery with unstable angina pectoris: Secondary | ICD-10-CM | POA: Diagnosis not present

## 2020-05-08 DIAGNOSIS — C44629 Squamous cell carcinoma of skin of left upper limb, including shoulder: Secondary | ICD-10-CM | POA: Diagnosis not present

## 2020-05-08 DIAGNOSIS — Z85828 Personal history of other malignant neoplasm of skin: Secondary | ICD-10-CM | POA: Diagnosis not present

## 2020-05-08 DIAGNOSIS — C4441 Basal cell carcinoma of skin of scalp and neck: Secondary | ICD-10-CM | POA: Diagnosis not present

## 2020-05-08 DIAGNOSIS — D485 Neoplasm of uncertain behavior of skin: Secondary | ICD-10-CM | POA: Diagnosis not present

## 2020-05-08 DIAGNOSIS — D0439 Carcinoma in situ of skin of other parts of face: Secondary | ICD-10-CM | POA: Diagnosis not present

## 2020-05-09 ENCOUNTER — Other Ambulatory Visit (HOSPITAL_COMMUNITY): Payer: Self-pay

## 2020-05-09 DIAGNOSIS — I110 Hypertensive heart disease with heart failure: Secondary | ICD-10-CM | POA: Diagnosis not present

## 2020-05-09 DIAGNOSIS — I5043 Acute on chronic combined systolic (congestive) and diastolic (congestive) heart failure: Secondary | ICD-10-CM | POA: Diagnosis not present

## 2020-05-09 DIAGNOSIS — E1169 Type 2 diabetes mellitus with other specified complication: Secondary | ICD-10-CM | POA: Diagnosis not present

## 2020-05-09 DIAGNOSIS — I051 Rheumatic mitral insufficiency: Secondary | ICD-10-CM | POA: Diagnosis not present

## 2020-05-09 DIAGNOSIS — I25110 Atherosclerotic heart disease of native coronary artery with unstable angina pectoris: Secondary | ICD-10-CM | POA: Diagnosis not present

## 2020-05-09 DIAGNOSIS — I4821 Permanent atrial fibrillation: Secondary | ICD-10-CM | POA: Diagnosis not present

## 2020-05-14 DIAGNOSIS — I25110 Atherosclerotic heart disease of native coronary artery with unstable angina pectoris: Secondary | ICD-10-CM | POA: Diagnosis not present

## 2020-05-14 DIAGNOSIS — I051 Rheumatic mitral insufficiency: Secondary | ICD-10-CM | POA: Diagnosis not present

## 2020-05-14 DIAGNOSIS — I5043 Acute on chronic combined systolic (congestive) and diastolic (congestive) heart failure: Secondary | ICD-10-CM | POA: Diagnosis not present

## 2020-05-14 DIAGNOSIS — I110 Hypertensive heart disease with heart failure: Secondary | ICD-10-CM | POA: Diagnosis not present

## 2020-05-14 DIAGNOSIS — E1169 Type 2 diabetes mellitus with other specified complication: Secondary | ICD-10-CM | POA: Diagnosis not present

## 2020-05-14 DIAGNOSIS — I4821 Permanent atrial fibrillation: Secondary | ICD-10-CM | POA: Diagnosis not present

## 2020-05-15 DIAGNOSIS — Z87891 Personal history of nicotine dependence: Secondary | ICD-10-CM | POA: Diagnosis not present

## 2020-05-15 DIAGNOSIS — E785 Hyperlipidemia, unspecified: Secondary | ICD-10-CM | POA: Diagnosis not present

## 2020-05-15 DIAGNOSIS — K529 Noninfective gastroenteritis and colitis, unspecified: Secondary | ICD-10-CM | POA: Diagnosis not present

## 2020-05-15 DIAGNOSIS — Z9089 Acquired absence of other organs: Secondary | ICD-10-CM | POA: Diagnosis not present

## 2020-05-15 DIAGNOSIS — Z90710 Acquired absence of both cervix and uterus: Secondary | ICD-10-CM | POA: Diagnosis not present

## 2020-05-15 DIAGNOSIS — Z9181 History of falling: Secondary | ICD-10-CM | POA: Diagnosis not present

## 2020-05-15 DIAGNOSIS — I4821 Permanent atrial fibrillation: Secondary | ICD-10-CM | POA: Diagnosis not present

## 2020-05-15 DIAGNOSIS — Z961 Presence of intraocular lens: Secondary | ICD-10-CM | POA: Diagnosis not present

## 2020-05-15 DIAGNOSIS — I051 Rheumatic mitral insufficiency: Secondary | ICD-10-CM | POA: Diagnosis not present

## 2020-05-15 DIAGNOSIS — Z7984 Long term (current) use of oral hypoglycemic drugs: Secondary | ICD-10-CM | POA: Diagnosis not present

## 2020-05-15 DIAGNOSIS — M1990 Unspecified osteoarthritis, unspecified site: Secondary | ICD-10-CM | POA: Diagnosis not present

## 2020-05-15 DIAGNOSIS — M109 Gout, unspecified: Secondary | ICD-10-CM | POA: Diagnosis not present

## 2020-05-15 DIAGNOSIS — E1169 Type 2 diabetes mellitus with other specified complication: Secondary | ICD-10-CM | POA: Diagnosis not present

## 2020-05-15 DIAGNOSIS — Z7901 Long term (current) use of anticoagulants: Secondary | ICD-10-CM | POA: Diagnosis not present

## 2020-05-15 DIAGNOSIS — I5043 Acute on chronic combined systolic (congestive) and diastolic (congestive) heart failure: Secondary | ICD-10-CM | POA: Diagnosis not present

## 2020-05-15 DIAGNOSIS — Z7982 Long term (current) use of aspirin: Secondary | ICD-10-CM | POA: Diagnosis not present

## 2020-05-15 DIAGNOSIS — Z9049 Acquired absence of other specified parts of digestive tract: Secondary | ICD-10-CM | POA: Diagnosis not present

## 2020-05-15 DIAGNOSIS — I25110 Atherosclerotic heart disease of native coronary artery with unstable angina pectoris: Secondary | ICD-10-CM | POA: Diagnosis not present

## 2020-05-15 DIAGNOSIS — I110 Hypertensive heart disease with heart failure: Secondary | ICD-10-CM | POA: Diagnosis not present

## 2020-05-21 DIAGNOSIS — I4821 Permanent atrial fibrillation: Secondary | ICD-10-CM | POA: Diagnosis not present

## 2020-05-21 DIAGNOSIS — I5043 Acute on chronic combined systolic (congestive) and diastolic (congestive) heart failure: Secondary | ICD-10-CM | POA: Diagnosis not present

## 2020-05-21 DIAGNOSIS — E1169 Type 2 diabetes mellitus with other specified complication: Secondary | ICD-10-CM | POA: Diagnosis not present

## 2020-05-21 DIAGNOSIS — I051 Rheumatic mitral insufficiency: Secondary | ICD-10-CM | POA: Diagnosis not present

## 2020-05-21 DIAGNOSIS — I25110 Atherosclerotic heart disease of native coronary artery with unstable angina pectoris: Secondary | ICD-10-CM | POA: Diagnosis not present

## 2020-05-21 DIAGNOSIS — I110 Hypertensive heart disease with heart failure: Secondary | ICD-10-CM | POA: Diagnosis not present

## 2020-05-28 ENCOUNTER — Other Ambulatory Visit: Payer: Self-pay | Admitting: *Deleted

## 2020-05-28 DIAGNOSIS — I25110 Atherosclerotic heart disease of native coronary artery with unstable angina pectoris: Secondary | ICD-10-CM | POA: Diagnosis not present

## 2020-05-28 DIAGNOSIS — I110 Hypertensive heart disease with heart failure: Secondary | ICD-10-CM | POA: Diagnosis not present

## 2020-05-28 DIAGNOSIS — I051 Rheumatic mitral insufficiency: Secondary | ICD-10-CM | POA: Diagnosis not present

## 2020-05-28 DIAGNOSIS — E1169 Type 2 diabetes mellitus with other specified complication: Secondary | ICD-10-CM | POA: Diagnosis not present

## 2020-05-28 DIAGNOSIS — I5043 Acute on chronic combined systolic (congestive) and diastolic (congestive) heart failure: Secondary | ICD-10-CM | POA: Diagnosis not present

## 2020-05-28 DIAGNOSIS — I4821 Permanent atrial fibrillation: Secondary | ICD-10-CM | POA: Diagnosis not present

## 2020-05-28 NOTE — Patient Outreach (Addendum)
[facility]) Care Management  05/28/2020  Allison Duffy Feb 18, 1926 [id number]   Outgoing call placed to member, successfull.  Member report she is doing well, was seen by PT today, last session will be next week.  Will have next follow up with PCP next month.  Denies any urgent concerns, encouraged to contact this care manager with questions.  Agrees to follow up within the next month.  Goals Addressed            This Visit's Progress   . COMPLETED: [facility] - Make and Keep All Appointments   On track    Timeframe:  Short-Term Goal Priority:  High Start Date:      3/21                       Expected End Date:      4/21                 Follow Up Date  4/4   - ask family or friend for a ride - call to cancel if needed    Why is this important?    Part of staying healthy is seeing the doctor for follow-up care.   If you forget your appointments, there are some things you can do to stay on track.    Notes:   5/2 - Confirmed follow up with cardiology on 4/6, will follow up again in 5-6 months    . [facility] - Track and Manage My Blood Pressure   On track    Timeframe:  Long-Range Goal Priority:  High Start Date: Restarted 3/21                         Expected End Date: 07/26/20                      Barriers: Knowledge    - check blood pressure 3 times per week - choose a place to take my blood pressure (home, clinic or office, retail store)    Why is this important?   You won't feel high blood pressure, but it can still hurt your blood vessels.  High blood pressure can cause heart or kidney problems. It can also cause a stroke.  Making lifestyle changes like losing a little weight or eating less salt will help.  Checking your blood pressure at home and at different times of the day can help to control blood pressure.  If the doctor prescribes medicine remember to take it the way the doctor ordered.  Call the office if you cannot afford the medicine or if there  are questions about it.     Notes: Patient checks her B/P and records the values daily.   3/21 - Reminded of importance of self monitoring  5/2 - Report she is monitoring blood pressure and weights daily, does not report any specific numbers today but state they have been "good"    . [facility] - Track and Manage Symptoms   On track    Timeframe:  Long-Range Goal Priority:  High Start Date: Restarted 3/21                         Expected End Date: 07/26/20                      Barriers: Knowledge    - begin a heart  failure diary - develop a rescue plan - follow rescue plan if symptoms flare-up    Why is this important?   You will be able to handle your symptoms better if you keep track of them.  Making some simple changes to your lifestyle will help.  Eating healthy is one thing you can do to take good care of yourself.    Notes: Patient weighs daily, records the values, and will call her doctor as needed.  3/21 - Member educated on signs/symptoms of CAD  5/2 - Discussed low salt diet and efforts to keep swelling managed      Valente David, Therapist, sports, MSN [place] [phone]

## 2020-06-04 DIAGNOSIS — Z85828 Personal history of other malignant neoplasm of skin: Secondary | ICD-10-CM | POA: Diagnosis not present

## 2020-06-04 DIAGNOSIS — C4441 Basal cell carcinoma of skin of scalp and neck: Secondary | ICD-10-CM | POA: Diagnosis not present

## 2020-06-06 DIAGNOSIS — I25110 Atherosclerotic heart disease of native coronary artery with unstable angina pectoris: Secondary | ICD-10-CM | POA: Diagnosis not present

## 2020-06-06 DIAGNOSIS — I4821 Permanent atrial fibrillation: Secondary | ICD-10-CM | POA: Diagnosis not present

## 2020-06-06 DIAGNOSIS — I5043 Acute on chronic combined systolic (congestive) and diastolic (congestive) heart failure: Secondary | ICD-10-CM | POA: Diagnosis not present

## 2020-06-06 DIAGNOSIS — I110 Hypertensive heart disease with heart failure: Secondary | ICD-10-CM | POA: Diagnosis not present

## 2020-06-06 DIAGNOSIS — E1169 Type 2 diabetes mellitus with other specified complication: Secondary | ICD-10-CM | POA: Diagnosis not present

## 2020-06-06 DIAGNOSIS — I051 Rheumatic mitral insufficiency: Secondary | ICD-10-CM | POA: Diagnosis not present

## 2020-06-13 DIAGNOSIS — I4821 Permanent atrial fibrillation: Secondary | ICD-10-CM | POA: Diagnosis not present

## 2020-06-13 DIAGNOSIS — I110 Hypertensive heart disease with heart failure: Secondary | ICD-10-CM | POA: Diagnosis not present

## 2020-06-13 DIAGNOSIS — I25110 Atherosclerotic heart disease of native coronary artery with unstable angina pectoris: Secondary | ICD-10-CM | POA: Diagnosis not present

## 2020-06-13 DIAGNOSIS — I5043 Acute on chronic combined systolic (congestive) and diastolic (congestive) heart failure: Secondary | ICD-10-CM | POA: Diagnosis not present

## 2020-06-13 DIAGNOSIS — E1169 Type 2 diabetes mellitus with other specified complication: Secondary | ICD-10-CM | POA: Diagnosis not present

## 2020-06-13 DIAGNOSIS — I051 Rheumatic mitral insufficiency: Secondary | ICD-10-CM | POA: Diagnosis not present

## 2020-06-27 ENCOUNTER — Ambulatory Visit: Payer: Self-pay | Admitting: *Deleted

## 2020-06-27 ENCOUNTER — Other Ambulatory Visit: Payer: Self-pay | Admitting: *Deleted

## 2020-06-27 NOTE — Patient Outreach (Signed)
[facility]) Care Management  06/27/2020  Allison Duffy [birth year] [id number]   Outgoing call placed to member, state she is doing well, adhering to CAD/CHF plan of care, including medications and diet management.  Denies any urgent concerns, encouraged to contact this care manager with questions.  Agrees to follow up within the next month.  Goals Addressed            This Visit's Progress   . [facility] - Track and Manage My Blood Pressure   On track    Timeframe:  Long-Range Goal Priority:  High Start Date: Restarted 3/21                         Expected End Date: 07/26/20                      Barriers: Knowledge    - check blood pressure 3 times per week - choose a place to take my blood pressure (home, clinic or office, retail store)    Why is this important?   You won't feel high blood pressure, but it can still hurt your blood vessels.  High blood pressure can cause heart or kidney problems. It can also cause a stroke.  Making lifestyle changes like losing a little weight or eating less salt will help.  Checking your blood pressure at home and at different times of the day can help to control blood pressure.  If the doctor prescribes medicine remember to take it the way the doctor ordered.  Call the office if you cannot afford the medicine or if there are questions about it.     Notes: Patient checks her B/P and records the values daily.   3/21 - Reminded of importance of self monitoring  5/2 - Report she is monitoring blood pressure and weights daily, does not report any specific numbers today but state they have been "good"  6/1 - Patient report blood pressure has been "up and down" but state it has been ok.  Unable to get to her log to report definite numbers    . [facility] - Track and Manage Symptoms   On track    Timeframe:  Long-Range Goal Priority:  High Start Date: Restarted 3/21                         Expected End Date: 07/26/20                       Barriers: Knowledge    - begin a heart failure diary - develop a rescue plan - follow rescue plan if symptoms flare-up    Why is this important?   You will be able to handle your symptoms better if you keep track of them.  Making some simple changes to your lifestyle will help.  Eating healthy is one thing you can do to take good care of yourself.    Notes: Patient weighs daily, records the values, and will call her doctor as needed.  3/21 - Member educated on signs/symptoms of CAD  5/2 - Discussed low salt diet and efforts to keep swelling managed  6/1 - Denies swelling, report weight loss of a couple pounds      Valente David, Therapist, sports, MSN [place] Manager [phone]

## 2020-07-02 DIAGNOSIS — E1129 Type 2 diabetes mellitus with other diabetic kidney complication: Secondary | ICD-10-CM | POA: Diagnosis not present

## 2020-07-02 DIAGNOSIS — I129 Hypertensive chronic kidney disease with stage 1 through stage 4 chronic kidney disease, or unspecified chronic kidney disease: Secondary | ICD-10-CM | POA: Diagnosis not present

## 2020-07-02 DIAGNOSIS — N182 Chronic kidney disease, stage 2 (mild): Secondary | ICD-10-CM | POA: Diagnosis not present

## 2020-07-02 DIAGNOSIS — E663 Overweight: Secondary | ICD-10-CM | POA: Diagnosis not present

## 2020-07-17 DIAGNOSIS — E1129 Type 2 diabetes mellitus with other diabetic kidney complication: Secondary | ICD-10-CM | POA: Diagnosis not present

## 2020-08-01 ENCOUNTER — Other Ambulatory Visit: Payer: Self-pay | Admitting: *Deleted

## 2020-08-01 NOTE — Patient Outreach (Signed)
[facility]) Care Management  [place]  08/01/2020   Allison Duffy 12/17/1926 [id number]   Outgoing call placed to member, state she is doing well.  Denies any pain or discomfort, report being seen by PCP a few weeks ago.  A1C remains good at 6.7.  Denies any urgent concerns, encouraged to contact this care manager with questions.   Encounter Medications:  Outpatient Encounter Medications as of 08/01/2020  Medication Sig   amLODipine (NORVASC) 10 MG tablet Take 10 mg by mouth at bedtime.   apixaban (ELIQUIS) 5 MG TABS tablet TAKE 1 TABLET BY MOUTH 2 TIMES DAILY   aspirin EC 81 MG tablet Take 1 tablet (81 mg total) by mouth daily. Swallow whole.   carboxymethylcellulose 1 % ophthalmic solution Place 1 drop into both eyes 2 (two) times daily.   carvedilol (COREG) 12.5 MG tablet TAKE 1 TABLET (12.5 MG TOTAL) BY MOUTH TWO TIMES DAILY WITH A MEAL.   cholecalciferol (VITAMIN D) 1000 UNITS tablet Take 1,000 Units by mouth daily with lunch.   cholestyramine (QUESTRAN) 4 g packet Take 4 g by mouth as needed.   cloNIDine (CATAPRES) 0.1 MG tablet Take 2 tablets(0.2 mg) in the morning and 1 tablet(0.1 mg) at bedtime (Patient taking differently: 0.1 mg 2 (two) times daily. Take 1 tablets(0.1 mg) in the morning and 2 tablets(0.2 mg) with supper)   furosemide (LASIX) 40 MG tablet Take 1 tablet (40 mg total) by mouth daily. Take 1 additional tablet daily if increased swelling, shortness of breath, or weight gain 5 lbs or more in 2 days (Patient taking differently: Take 40 mg by mouth daily.)   glipiZIDE (GLUCOTROL) 10 MG tablet Take 10 mg by mouth daily before breakfast.   Lancets (ONETOUCH ULTRASOFT) lancets use to check blood sugar twice a day as directed   metFORMIN (GLUCOPHAGE) 500 MG tablet Take 1 tablet (500 mg total) by mouth 2 (two) times daily with a meal.   Multiple Vitamin (MULTIVITAMIN WITH MINERALS) TABS Take 1 tablet by mouth daily with supper.   nitroGLYCERIN  (NITROSTAT) 0.4 MG SL tablet PLACE 1 TABLET (0.4 MG TOTAL) UNDER THE TONGUE EVERY FIVE MINUTES X 3 DOSES AS NEEDED FOR CHEST PAIN.   olmesartan (BENICAR) 40 MG tablet Take 40 mg by mouth at bedtime.   omeprazole (PRILOSEC) 40 MG capsule Take 1 capsule (40 mg total) by mouth daily with supper.   omeprazole (PRILOSEC) 40 MG capsule TAKE 1 CAPSULE (40 MG TOTAL) BY MOUTH DAILY WITH SUPPER.   ONETOUCH ULTRA test strip USE AS DIRECTED TO TEST BLOOD SUGAR TWICE DAILY   potassium chloride SA (KLOR-CON) 20 MEQ tablet Take 1 tablet (20 mEq total) by mouth daily.   potassium chloride SA (KLOR-CON) 20 MEQ tablet TAKE 1 TABLET (20 MEQ TOTAL) BY MOUTH DAILY.   Probiotic Product (PRO-BIOTIC BLEND) CAPS Take 1 capsule by mouth at bedtime. Takes OTC probiotic   ranolazine (RANEXA) 500 MG 12 hr tablet Take 1 tablet (500 mg total) by mouth 2 (two) times daily.   ranolazine (RANEXA) 500 MG 12 hr tablet TAKE 1 TABLET (500 MG TOTAL) BY MOUTH TWO TIMES DAILY.   rosuvastatin (CRESTOR) 20 MG tablet Take 1 tablet (20 mg total) by mouth at bedtime.   rosuvastatin (CRESTOR) 20 MG tablet TAKE 1 TABLET (20 MG TOTAL) BY MOUTH AT BEDTIME.   sitaGLIPtin (JANUVIA) 100 MG tablet Take 100 mg by mouth daily with lunch.   Turmeric (QC TUMERIC COMPLEX PO) Take by mouth as  needed. Patient reports takes OTC as needed   White Petrolatum-Mineral Oil (ARTIFICIAL TEARS) ointment Place into both eyes as needed.   No facility-administered encounter medications on file as of 08/01/2020.    Functional Status:  No flowsheet data found.  Fall/Depression Screening: Fall Risk  04/16/2020 10/31/2019 09/05/2019  Falls in the past year? 0 0 0  Number falls in past yr: 0 0 0  Comment - - -  Injury with Fall? 0 0 0  Comment - - -  Risk for fall due to : - Impaired mobility;Impaired balance/gait Impaired mobility;Impaired balance/gait  Risk for fall due to: Comment - - -  Follow up - Falls prevention discussed;Falls evaluation completed;Education  provided Falls prevention discussed;Education provided;Falls evaluation completed   PHQ 2/9 Scores 04/16/2020 10/31/2019 05/25/2019 02/02/2019  PHQ - 2 Score 0 0 0 0    Assessment:   Care Plan There are no care plans that you recently modified to display for this patient.    Goals Addressed             This Visit's Progress    THN - Track and Manage My Blood Pressure   On track    Timeframe:  Long-Range Goal Priority:  High Start Date: Restarted 3/21                         Expected End Date: 9/21                   Barriers: Knowledge    - check blood pressure 3 times per week - choose a place to take my blood pressure (home, clinic or office, retail store)    Why is this important?   You won't feel high blood pressure, but it can still hurt your blood vessels.  High blood pressure can cause heart or kidney problems. It can also cause a stroke.  Making lifestyle changes like losing a little weight or eating less salt will help.  Checking your blood pressure at home and at different times of the day can help to control blood pressure.  If the doctor prescribes medicine remember to take it the way the doctor ordered.  Call the office if you cannot afford the medicine or if there are questions about it.     Notes: Patient checks her B/P and records the values daily.   3/21 - Reminded of importance of self monitoring  5/2 - Report she is monitoring blood pressure and weights daily, does not report any specific numbers today but state they have been "good"  6/1 - Patient report blood pressure has been "up and down" but state it has been ok.  Unable to get to her log to report definite numbers  7/6 - Denies any leg swelling, state blood pressure has remained "good."      THN - Track and Manage Symptoms   On track    Timeframe:  Long-Range Goal Priority:  High Start Date: Restarted 3/21                         Expected End Date: 9/21              Barriers: Knowledge     - begin a heart failure diary - develop a rescue plan - follow rescue plan if symptoms flare-up    Why is this important?   You will be able to handle your symptoms better if  you keep track of them.  Making some simple changes to your lifestyle will help.  Eating healthy is one thing you can do to take good care of yourself.    Notes: Patient weighs daily, records the values, and will call her doctor as needed.  3/21 - Member educated on signs/symptoms of CAD  5/2 - Discussed low salt diet and efforts to keep swelling managed  6/1 - Denies swelling, report weight loss of a couple pounds  7/6 - Report continued weight loss as well as management of CAD.  Denies chest pain or discomfort.         Plan:  Follow-up: Patient agrees to Care Plan and Follow-up. Follow-up in 1 month(s).  Valente David, [place], MSN [place] [phone]

## 2020-08-28 ENCOUNTER — Other Ambulatory Visit: Payer: Self-pay | Admitting: *Deleted

## 2020-08-28 ENCOUNTER — Other Ambulatory Visit: Payer: Self-pay | Admitting: Adult Health

## 2020-08-28 NOTE — Patient Outreach (Signed)
[place] [facility]) Care Management  08/28/2020  Allison Duffy [birth year] [id number]   Outgoing call placed to member, state she is "pretty good."  Next office visit with PCP scheduled for 9/14.  Denies any urgent concerns, encouraged to contact this care manager with questions.  Agrees to follow up within the next month.   Goals Addressed             This Visit's Progress    THN - Track and Manage My Blood Pressure   On track    Timeframe:  Long-Range Goal Priority:  High Start Date: Restarted 3/21                         Expected End Date: 9/21                   Barriers: Knowledge    - check blood pressure 3 times per week - choose a place to take my blood pressure (home, clinic or office, retail store)    Why is this important?   You won't feel high blood pressure, but it can still hurt your blood vessels.  High blood pressure can cause heart or kidney problems. It can also cause a stroke.  Making lifestyle changes like losing a little weight or eating less salt will help.  Checking your blood pressure at home and at different times of the day can help to control blood pressure.  If the doctor prescribes medicine remember to take it the way the doctor ordered.  Call the office if you cannot afford the medicine or if there are questions about it.     Notes: Patient checks her B/P and records the values daily.   3/21 - Reminded of importance of self monitoring  5/2 - Report she is monitoring blood pressure and weights daily, does not report any specific numbers today but state they have been "good"  6/1 - Patient report blood pressure has been "up and down" but state it has been ok.  Unable to get to her log to report definite numbers  7/6 - Denies any leg swelling, state blood pressure has remained "good."  8/2 - Report blood pressure being within normal range, HR in the 60's     THN - Track and Manage Symptoms   On track    Timeframe:  Long-Range  Goal Priority:  High Start Date: Restarted 3/21                         Expected End Date: 9/21              Barriers: Knowledge    - begin a heart failure diary - develop a rescue plan - follow rescue plan if symptoms flare-up    Why is this important?   You will be able to handle your symptoms better if you keep track of them.  Making some simple changes to your lifestyle will help.  Eating healthy is one thing you can do to take good care of yourself.    Notes: Patient weighs daily, records the values, and will call her doctor as needed.  3/21 - Member educated on signs/symptoms of CAD  5/2 - Discussed low salt diet and efforts to keep swelling managed  6/1 - Denies swelling, report weight loss of a couple pounds  7/6 - Report continued weight loss as well as management of CAD.  Denies chest pain  or discomfort.  8/2 - Continue to report good management of CAD.  Has appointment with cardiology within the next couple weeks, unsure of the exact date as her daughter has it on her calendar         Valente David, Therapist, sports, MSN [place] [phone]

## 2020-09-10 ENCOUNTER — Other Ambulatory Visit: Payer: Self-pay | Admitting: Cardiology

## 2020-09-10 NOTE — Telephone Encounter (Signed)
Prescription refill request for Eliquis received. Last office visit:[place]  05/02/20 Scr:0.94 04/13/20 Age: 67fWeight:78.3kg

## 2020-10-03 ENCOUNTER — Other Ambulatory Visit: Payer: Self-pay | Admitting: *Deleted

## 2020-10-03 NOTE — Patient Outreach (Signed)
[facility] [facility]) Care Management  10/03/2020  ZYAIRA LOGES 1926-02-29 [id number]   Outgoing call placed to member, successful.  Report she has been "stable."  Family continue to be of help when needed.  Denies any urgent concerns, encouraged to contact this care manager with questions.  Agrees to follow up within the next month.   Goals Addressed             This Visit's Progress    THN - Track and Manage My Blood Pressure   On track    Timeframe:  Long-Range Goal Priority:  High Start Date: Restarted 3/21                         Expected End Date: 9/21                   Barriers: Knowledge    - check blood pressure 3 times per week - choose a place to take my blood pressure (home, clinic or office, retail store)    Why is this important?   You won't feel high blood pressure, but it can still hurt your blood vessels.  High blood pressure can cause heart or kidney problems. It can also cause a stroke.  Making lifestyle changes like losing a little weight or eating less salt will help.  Checking your blood pressure at home and at different times of the day can help to control blood pressure.  If the doctor prescribes medicine remember to take it the way the doctor ordered.  Call the office if you cannot afford the medicine or if there are questions about it.     Notes: Patient checks her B/P and records the values daily.   3/21 - Reminded of importance of self monitoring  5/2 - Report she is monitoring blood pressure and weights daily, does not report any specific numbers today but state they have been "good"  6/1 - Patient report blood pressure has been "up and down" but state it has been ok.  Unable to get to her log to report definite numbers  7/6 - Denies any leg swelling, state blood pressure has remained "good."  8/2 - Report blood pressure being within normal range, HR in the 60's  9/7 - BP range 120-140/s/80s.  Denies any hypotensive or severe  hypotensive episodes.  Denies any chest pain or discomfort     THN - Track and Manage Symptoms   On track    Timeframe:  Long-Range Goal Priority:  High Start Date: Restarted 3/21                         Expected End Date: 9/21              Barriers: Knowledge    - begin a heart failure diary - develop a rescue plan - follow rescue plan if symptoms flare-up    Why is this important?   You will be able to handle your symptoms better if you keep track of them.  Making some simple changes to your lifestyle will help.  Eating healthy is one thing you can do to take good care of yourself.    Notes: Patient weighs daily, records the values, and will call her doctor as needed.  3/21 - Member educated on signs/symptoms of CAD  5/2 - Discussed low salt diet and efforts to keep swelling managed  6/1 - Denies swelling, report  weight loss of a couple pounds  7/6 - Report continued weight loss as well as management of CAD.  Denies chest pain or discomfort.  8/2 - Continue to report good management of CAD.  Has appointment with cardiology within the next couple weeks, unsure of the exact date as her daughter has it on her calendar  9/7 - Report cardiology appointment went well, no changes in care plan       Valente David, RN, MSN [place] Manager [phone]

## 2020-10-20 IMAGING — CT CT ANGIO CHEST
2 of 6 series · 18 of 36 positions shown · IV contrast (omnipaque)
Comparison: Chest x-ray 01/27/2019, CT 06/01/2016

CLINICAL DATA: Hyperglycemia cough generalized weakness and
positive D-dimer

EXAM:
CT ANGIOGRAPHY CHEST WITH CONTRAST
TECHNIQUE: Multidetector CT imaging of the chest was performed using the
standard protocol during bolus administration of intravenous
contrast. Multiplanar CT image reconstructions and MIPs were
obtained to evaluate the vascular anatomy.
CONTRAST:  75mL OMNIPAQUE IOHEXOL 350 MG/ML SOLN

[Series 7: pe thins · axial · 0.67mm/px · z∈[+1051,+1285]mm · 17 of 372 slices shown]
[im 19/372  lung]
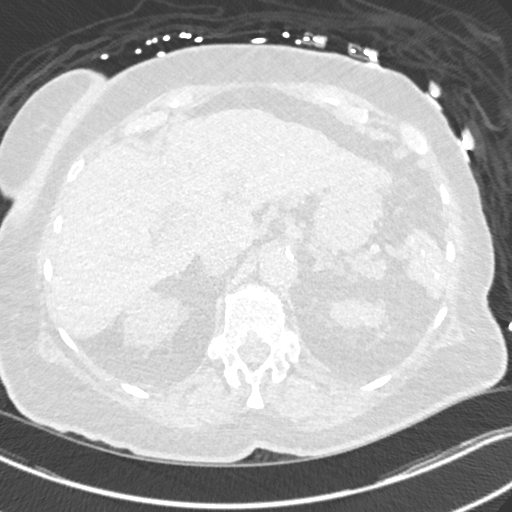
[im 38/372  mediastinal]
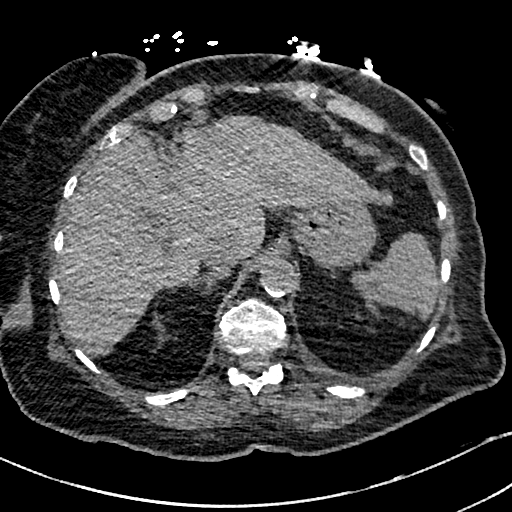
[im 56/372  lung]
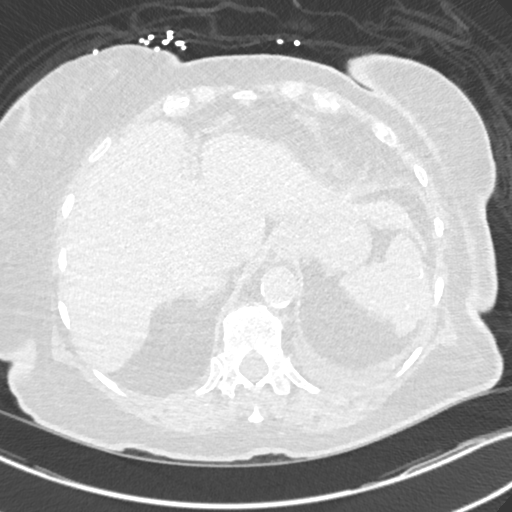
[im 75/372  mediastinal]
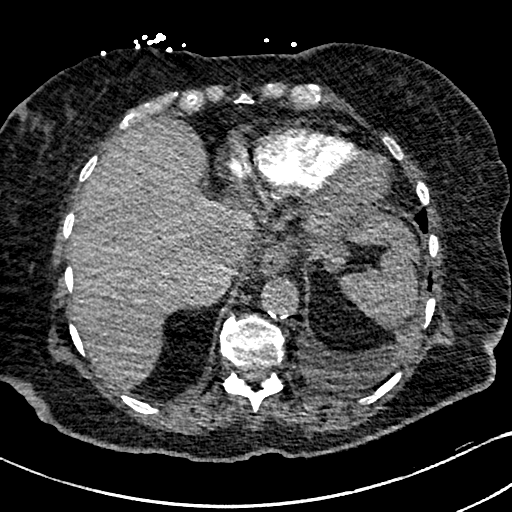
[im 112/372  lung]
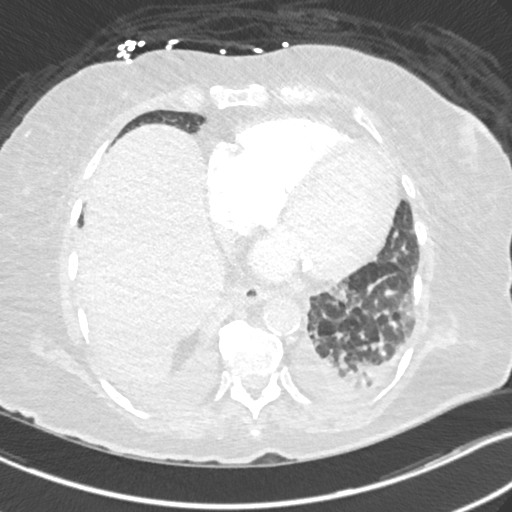
[im 130/372  mediastinal]
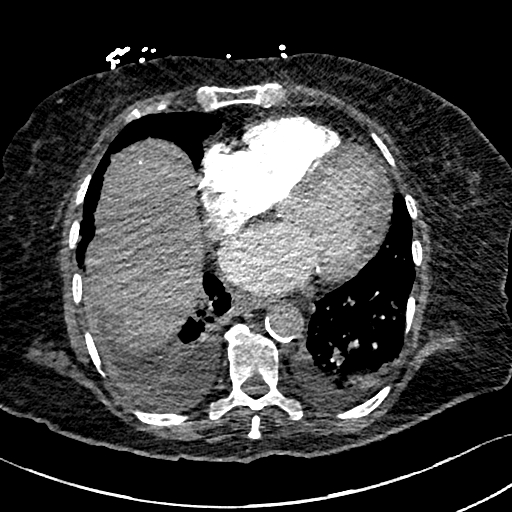
[im 149/372  lung]
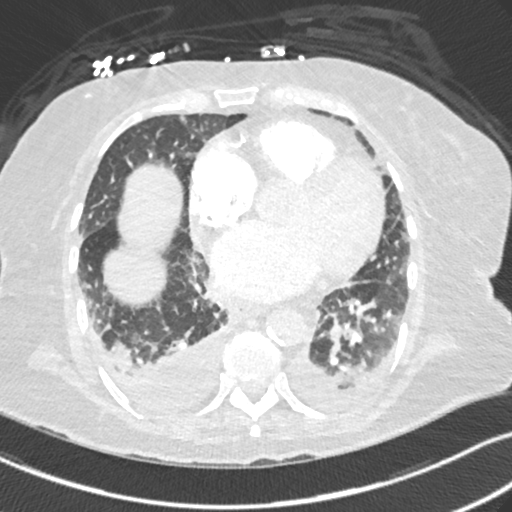
[im 167/372  mediastinal]
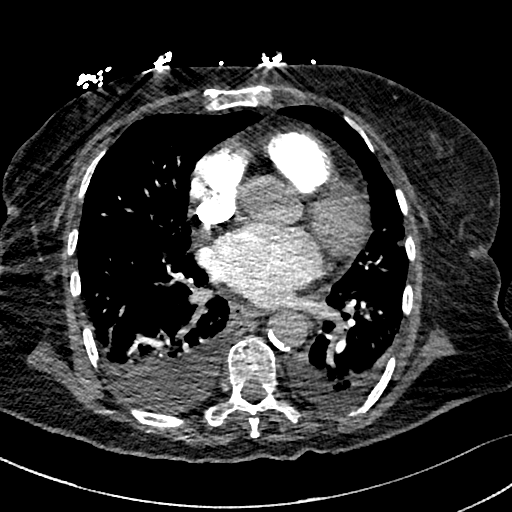
[im 186/372  lung]
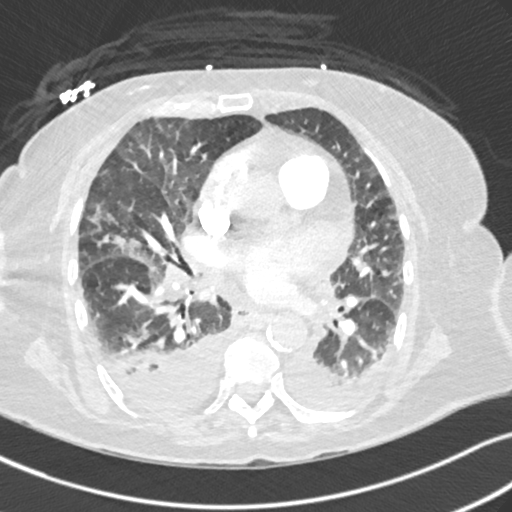
[im 205/372  mediastinal]
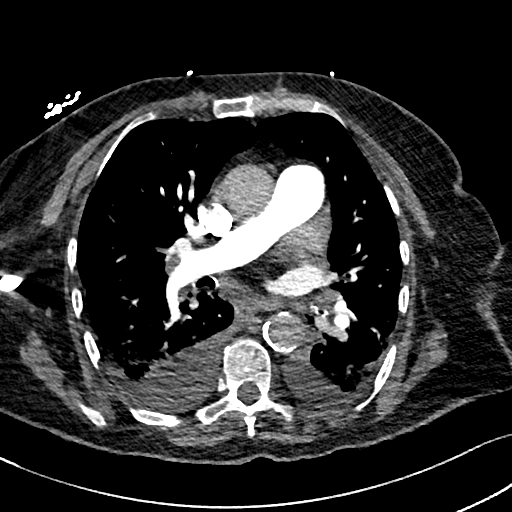
[im 223/372  lung]
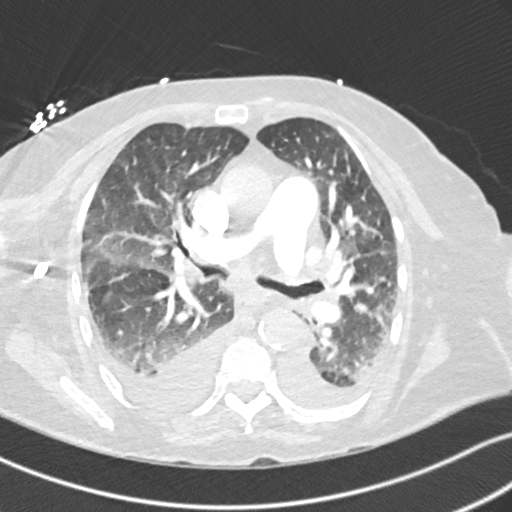
[im 242/372  mediastinal]
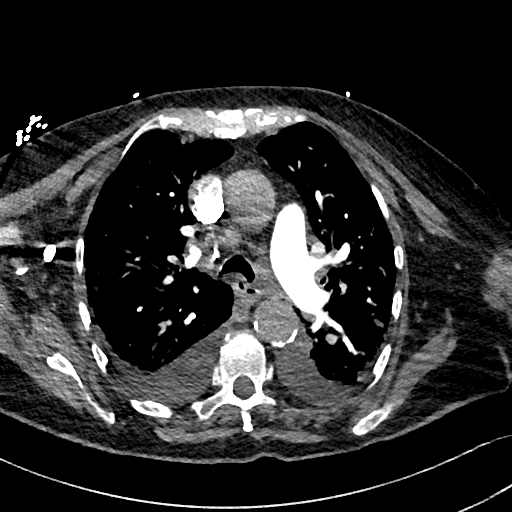
[im 260/372  lung]
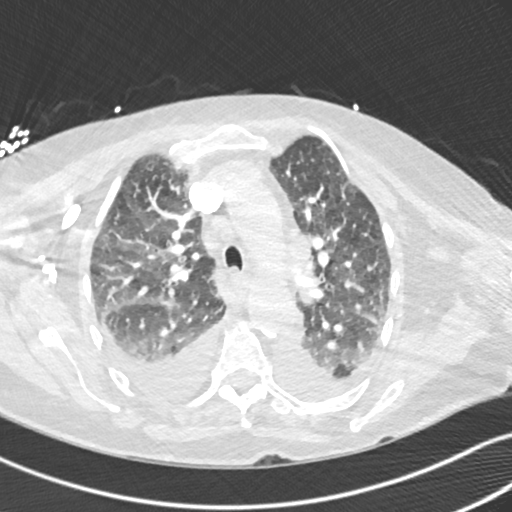
[im 297/372  mediastinal]
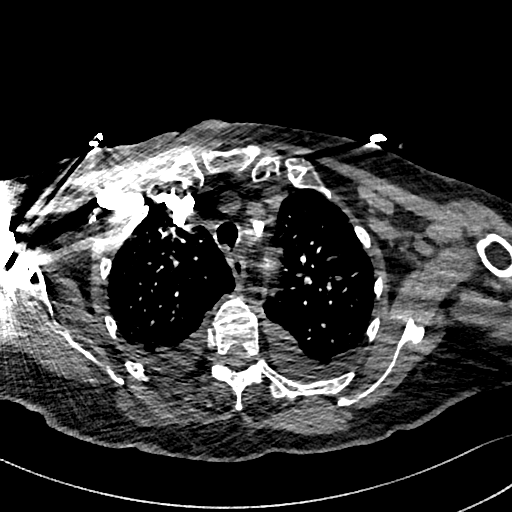
[im 316/372  lung]
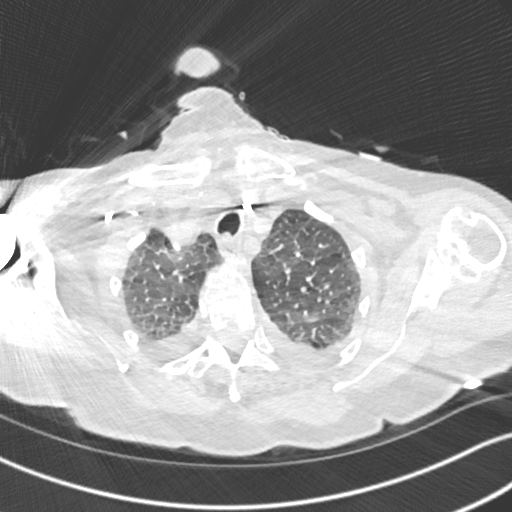
[im 334/372  mediastinal]
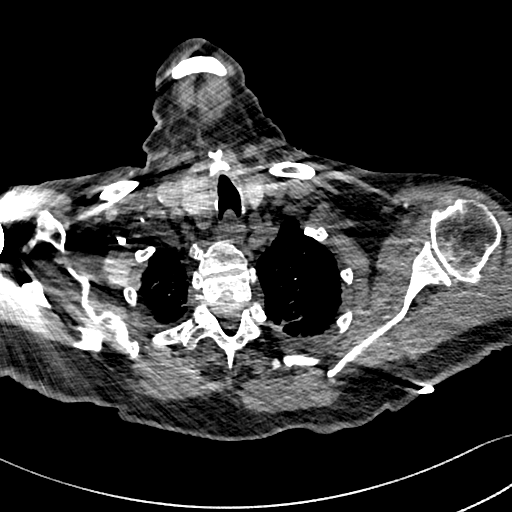
[im 353/372  lung]
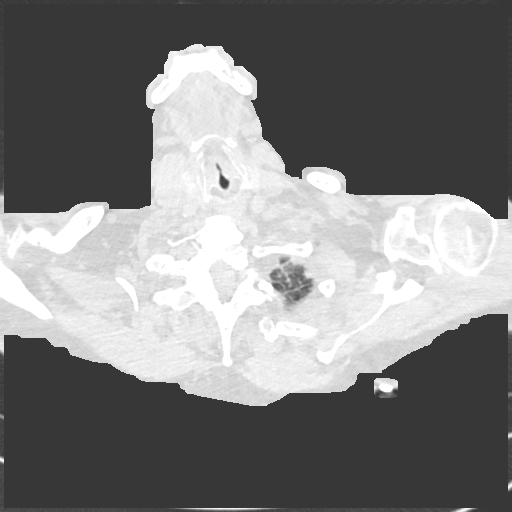

[Series 8: pe 2mm cor · coronal · 0.52mm/px · 1 of 153 slices shown]
[im 77/153  mediastinal]
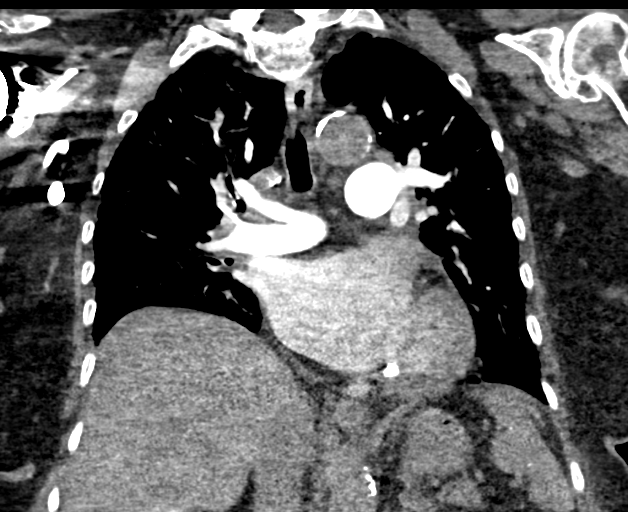

[18 of 36 positions shown; findings below may reference images not displayed]

FINDINGS: Cardiovascular: Satisfactory opacification of the pulmonary arteries
to the segmental level. No evidence of pulmonary embolism. Moderate
aortic atherosclerosis. No aneurysmal dilatation. Coronary vascular
calcification. Borderline to mild cardiomegaly. No pericardial
effusion.

Mediastinum/Nodes: Midline trachea. No thyroid mass. Borderline to
slightly enlarged mediastinal lymph nodes. Lymph node adjacent to
the aortic arch measures 13 mm. Right paratracheal node measures 15
mm. Subcarinal lymph node measures 16 mm. Small bilateral hilar
nodes. Esophagus within normal limits

Lungs/Pleura: Small bilateral pleural effusions. Diffuse bilateral
hazy lung density. Suggestion of mild septal thickening. No
pneumothorax. 11 mm right middle lobe pulmonary nodule, series 6,
image number 64, stable compared to multiple priors and therefore
felt benign. Passive atelectasis at both bases

Upper Abdomen: Splenic granuloma. Slightly nodular liver contour,
possible cirrhosis. No acute abnormality.

Musculoskeletal: Sternum is intact. Degenerative changes of the
spine. No acute osseous abnormality.

Review of the MIP images confirms the above findings.
IMPRESSION: 1. Negative for acute pulmonary embolus.
2. Small bilateral pleural effusions. Diffuse hazy attenuation of
the lung fields with suggestion of septal thickening, suggestive of
pulmonary edema.
3. Mild mediastinal adenopathy, either reactive, due to infection,
or potentially lymphoproliferative disease.
4. Nodular liver contour suggesting possible cirrhosis
5. No change in 11 mm right middle lobe lung nodule, felt benign
given lack of interval change

Aortic Atherosclerosis (VPNTF-ABD.D).

## 2020-10-30 ENCOUNTER — Other Ambulatory Visit: Payer: Self-pay | Admitting: *Deleted

## 2020-10-30 NOTE — Patient Outreach (Signed)
[facility]) Care Management  [place]  10/30/2020   Allison Duffy Oct 18, 1926 [id number]   Outgoing call placed to member, state she is "good."  Denies any urgent concerns, encouraged to contact this care manager with questions.  Agrees to follow up with health coach going forward.  Encounter Medications:  Outpatient Encounter Medications as of 10/30/2020  Medication Sig   amLODipine (NORVASC) 10 MG tablet Take 10 mg by mouth at bedtime.   apixaban (ELIQUIS) 5 MG TABS tablet TAKE 1 TABLET BY MOUTH TWICE DAILY   aspirin EC 81 MG tablet Take 1 tablet (81 mg total) by mouth daily. Swallow whole.   carboxymethylcellulose 1 % ophthalmic solution Place 1 drop into both eyes 2 (two) times daily.   carvedilol (COREG) 12.5 MG tablet TAKE 1 TABLET (12.5 MG TOTAL) BY MOUTH TWO TIMES DAILY WITH A MEAL.   cholecalciferol (VITAMIN D) 1000 UNITS tablet Take 1,000 Units by mouth daily with lunch.   cholestyramine (QUESTRAN) 4 g packet Take 4 g by mouth as needed.   cloNIDine (CATAPRES) 0.1 MG tablet TAKE 2 TABLETS BY MOUTH EVERY MORNING AND 1 AT BEDTIME   furosemide (LASIX) 40 MG tablet Take 1 tablet (40 mg total) by mouth daily. Take 1 additional tablet daily if increased swelling, shortness of breath, or weight gain 5 lbs or more in 2 days (Patient taking differently: Take 40 mg by mouth daily.)   glipiZIDE (GLUCOTROL) 10 MG tablet Take 10 mg by mouth daily before breakfast.   Lancets (ONETOUCH ULTRASOFT) lancets use to check blood sugar twice a day as directed   metFORMIN (GLUCOPHAGE) 500 MG tablet Take 1 tablet (500 mg total) by mouth 2 (two) times daily with a meal.   Multiple Vitamin (MULTIVITAMIN WITH MINERALS) TABS Take 1 tablet by mouth daily with supper.   nitroGLYCERIN (NITROSTAT) 0.4 MG SL tablet PLACE 1 TABLET (0.4 MG TOTAL) UNDER THE TONGUE EVERY FIVE MINUTES X 3 DOSES AS NEEDED FOR CHEST PAIN.   olmesartan (BENICAR) 40 MG tablet Take 40 mg by mouth at bedtime.    omeprazole (PRILOSEC) 40 MG capsule Take 1 capsule (40 mg total) by mouth daily with supper.   omeprazole (PRILOSEC) 40 MG capsule TAKE 1 CAPSULE (40 MG TOTAL) BY MOUTH DAILY WITH SUPPER.   ONETOUCH ULTRA test strip USE AS DIRECTED TO TEST BLOOD SUGAR TWICE DAILY   potassium chloride SA (KLOR-CON) 20 MEQ tablet Take 1 tablet (20 mEq total) by mouth daily.   potassium chloride SA (KLOR-CON) 20 MEQ tablet TAKE 1 TABLET (20 MEQ TOTAL) BY MOUTH DAILY.   Probiotic Product (PRO-BIOTIC BLEND) CAPS Take 1 capsule by mouth at bedtime. Takes OTC probiotic   ranolazine (RANEXA) 500 MG 12 hr tablet Take 1 tablet (500 mg total) by mouth 2 (two) times daily.   ranolazine (RANEXA) 500 MG 12 hr tablet TAKE 1 TABLET (500 MG TOTAL) BY MOUTH TWO TIMES DAILY.   rosuvastatin (CRESTOR) 20 MG tablet Take 1 tablet (20 mg total) by mouth at bedtime.   rosuvastatin (CRESTOR) 20 MG tablet TAKE 1 TABLET (20 MG TOTAL) BY MOUTH AT BEDTIME.   sitaGLIPtin (JANUVIA) 100 MG tablet Take 100 mg by mouth daily with lunch.   Turmeric (QC TUMERIC COMPLEX PO) Take by mouth as needed. Patient reports takes OTC as needed   White Petrolatum-Mineral Oil (ARTIFICIAL TEARS) ointment Place into both eyes as needed.   No facility-administered encounter medications on file as of 10/30/2020.    Functional Status:  No flowsheet data found.  Fall/Depression Screening: Fall Risk  04/16/2020 10/31/2019 09/05/2019  Falls in the past year? 0 0 0  Number falls in past yr: 0 0 0  Comment - - -  Injury with Fall? 0 0 0  Comment - - -  Risk for fall due to : - Impaired mobility;Impaired balance/gait Impaired mobility;Impaired balance/gait  Risk for fall due to: Comment - - -  Follow up - Falls prevention discussed;Falls evaluation completed;Education provided Falls prevention discussed;Education provided;Falls evaluation completed   PHQ 2/9 Scores 04/16/2020 10/31/2019 05/25/2019 02/02/2019  PHQ - 2 Score 0 0 0 0    Assessment:   Care  Plan There are no care plans that you recently modified to display for this patient.    Goals Addressed             This Visit's Progress    COMPLETED: [facility]N - Track and Manage My Blood Pressure   On track    Timeframe:  Long-Range Goal Priority:  High Start Date: Restarted 3/21                         Expected End Date: 9/21                   Barriers: Knowledge    - check blood pressure 3 times per week - choose a place to take my blood pressure (home, clinic or office, retail store)    Why is this important?   You won't feel high blood pressure, but it can still hurt your blood vessels.  High blood pressure can cause heart or kidney problems. It can also cause a stroke.  Making lifestyle changes like losing a little weight or eating less salt will help.  Checking your blood pressure at home and at different times of the day can help to control blood pressure.  If the doctor prescribes medicine remember to take it the way the doctor ordered.  Call the office if you cannot afford the medicine or if there are questions about it.     Notes: Patient checks her B/P and records the values daily.   3/21 - Reminded of importance of self monitoring  5/2 - Report she is monitoring blood pressure and weights daily, does not report any specific numbers today but state they have been "good"  6/1 - Patient report blood pressure has been "up and down" but state it has been ok.  Unable to get to her log to report definite numbers  7/6 - Denies any leg swelling, state blood pressure has remained "good."  8/2 - Report blood pressure being within normal range, HR in the 60's  9/7 - BP range 120-140/s/80s.  Denies any hypotensive or severe hypotensive episodes.  Denies any chest pain or discomfort  10/4- Blood pressure and HR has remained stable, today was 154/62 and 59.  Noted that she is due for follow up with cardiology this month (last 4/6, recommended 6 month follow up).  Call placed  to daughter, she will call to schedule appointment     COMPLETED: [facility] - Track and Manage Symptoms   On track    Timeframe:  Long-Range Goal Priority:  High Start Date: Restarted 3/21                         Expected End Date: 9/21  Barriers: Knowledge    - begin a heart failure diary - develop a rescue plan - follow rescue plan if symptoms flare-up    Why is this important?   You will be able to handle your symptoms better if you keep track of them.  Making some simple changes to your lifestyle will help.  Eating healthy is one thing you can do to take good care of yourself.    Notes: Patient weighs daily, records the values, and will call her doctor as needed.  3/21 - Member educated on signs/symptoms of CAD  5/2 - Discussed low salt diet and efforts to keep swelling managed  6/1 - Denies swelling, report weight loss of a couple pounds  7/6 - Report continued weight loss as well as management of CAD.  Denies chest pain or discomfort.  8/2 - Continue to report good management of CAD.  Has appointment with cardiology within the next couple weeks, unsure of the exact date as her daughter has it on her calendar  9/7 - Report cardiology appointment went well, no changes in care plan  10/4 - Denies any signs/symptoms of chest pain or discomfort.  Follow up with PCP on 10/17        Plan:  Follow-up: Patient agrees to Care Plan and Follow-up.  Will place referral to health coach for ongoing disease management.  Valente David, [place], MSN [place] [phone]

## 2020-10-31 ENCOUNTER — Other Ambulatory Visit: Payer: Self-pay | Admitting: *Deleted

## 2020-11-12 DIAGNOSIS — Z7901 Long term (current) use of anticoagulants: Secondary | ICD-10-CM | POA: Diagnosis not present

## 2020-11-12 DIAGNOSIS — E669 Obesity, unspecified: Secondary | ICD-10-CM | POA: Diagnosis not present

## 2020-11-12 DIAGNOSIS — E1129 Type 2 diabetes mellitus with other diabetic kidney complication: Secondary | ICD-10-CM | POA: Diagnosis not present

## 2020-11-12 DIAGNOSIS — Z23 Encounter for immunization: Secondary | ICD-10-CM | POA: Diagnosis not present

## 2020-11-12 DIAGNOSIS — I509 Heart failure, unspecified: Secondary | ICD-10-CM | POA: Diagnosis not present

## 2020-11-12 DIAGNOSIS — I130 Hypertensive heart and chronic kidney disease with heart failure and stage 1 through stage 4 chronic kidney disease, or unspecified chronic kidney disease: Secondary | ICD-10-CM | POA: Diagnosis not present

## 2020-11-18 ENCOUNTER — Other Ambulatory Visit: Payer: Self-pay

## 2020-11-18 ENCOUNTER — Emergency Department (HOSPITAL_COMMUNITY)
Admission: EM | Admit: 2020-11-18 | Discharge: 2020-11-19 | Disposition: A | Discharge status: Home / Self Care | Payer: Medicare Other | Attending: Emergency Medicine | Admitting: Emergency Medicine

## 2020-11-18 ENCOUNTER — Emergency Department (HOSPITAL_COMMUNITY): Payer: Medicare Other

## 2020-11-18 DIAGNOSIS — E119 Type 2 diabetes mellitus without complications: Secondary | ICD-10-CM | POA: Diagnosis not present

## 2020-11-18 DIAGNOSIS — I509 Heart failure, unspecified: Secondary | ICD-10-CM | POA: Insufficient documentation

## 2020-11-18 DIAGNOSIS — I110 Hypertensive heart disease with heart failure: Secondary | ICD-10-CM | POA: Diagnosis not present

## 2020-11-18 DIAGNOSIS — R2243 Localized swelling, mass and lump, lower limb, bilateral: Secondary | ICD-10-CM | POA: Diagnosis not present

## 2020-11-18 DIAGNOSIS — Z5321 Procedure and treatment not carried out due to patient leaving prior to being seen by health care provider: Secondary | ICD-10-CM | POA: Diagnosis not present

## 2020-11-18 DIAGNOSIS — M7989 Other specified soft tissue disorders: Secondary | ICD-10-CM | POA: Diagnosis not present

## 2020-11-18 DIAGNOSIS — R2241 Localized swelling, mass and lump, right lower limb: Secondary | ICD-10-CM | POA: Insufficient documentation

## 2020-11-18 DIAGNOSIS — R0602 Shortness of breath: Secondary | ICD-10-CM | POA: Diagnosis not present

## 2020-11-18 LAB — BASIC METABOLIC PANEL
Anion gap: 10 (ref 5–15)
BUN: 18 mg/dL (ref 8–23)
CO2: 25 mmol/L (ref 22–32)
Calcium: 9.1 mg/dL (ref 8.9–10.3)
Chloride: 101 mmol/L (ref 98–111)
Creatinine, Ser: 1.3 mg/dL — ABNORMAL HIGH (ref 0.44–1.00)
GFR, Estimated: 39 mL/min — ABNORMAL LOW (ref >60)
Glucose, Bld: 118 mg/dL — ABNORMAL HIGH (ref 70–99)
Potassium: 3.9 mmol/L (ref 3.5–5.1)
Sodium: 136 mmol/L (ref 135–145)

## 2020-11-18 LAB — HEPATIC FUNCTION PANEL
ALT: 22 U/L (ref 0–44)
AST: 27 U/L (ref 15–41)
Albumin: 4 g/dL (ref 3.5–5.0)
Alkaline Phosphatase: 60 U/L (ref 38–126)
Bilirubin, Direct: 0.2 mg/dL (ref 0.0–0.2)
Indirect Bilirubin: 0.3 mg/dL (ref 0.3–0.9)
Total Bilirubin: 0.5 mg/dL (ref 0.3–1.2)
Total Protein: 7.2 g/dL (ref 6.5–8.1)

## 2020-11-18 LAB — CBC
HCT: 38.6 % (ref 36.0–46.0)
Hemoglobin: 13.3 g/dL (ref 12.0–15.0)
MCH: 33.8 pg (ref 26.0–34.0)
MCHC: 34.5 g/dL (ref 30.0–36.0)
MCV: 98 fL (ref 80.0–100.0)
Platelets: 227 10*3/uL (ref 150–400)
RBC: 3.94 MIL/uL (ref 3.87–5.11)
RDW: 11.9 % (ref 11.5–15.5)
WBC: 8.2 10*3/uL (ref 4.0–10.5)
nRBC: 0 % (ref 0.0–0.2)

## 2020-11-18 LAB — BRAIN NATRIURETIC PEPTIDE: B Natriuretic Peptide: 234 pg/mL — ABNORMAL HIGH (ref 0.0–100.0)

## 2020-11-18 LAB — CBG MONITORING, ED: Glucose-Capillary: 178 mg/dL — ABNORMAL HIGH (ref 70–99)

## 2020-11-18 NOTE — ED Provider Notes (Signed)
Emergency Medicine Provider Triage Evaluation Note  Allison Duffy , a 87 y.o. female  was evaluated in triage.  Pt complains of swelling to swelling and edema to right great toe..  Patient reports baseline swelling to lower extremities with left greater than right.  Patient reports that swelling has increased over the last 2 days.  Patient does not use compression stockings, has not been on her feet more than usual, has been taking her Lasix as prescribed.    Patient reports wearing old tennis shoes on Friday, since that she has had swelling and erythema to right great toe denies any pain however has neuropathy of bilateral feet.  Review of Systems  Positive: Bilateral lower leg swelling, swelling to right great toe, shortness of breath Negative: Chest pain, fever, chills, cough  Physical Exam  BP (!) 207/78   Pulse 71   Temp 98.2 F (36.8 C) (Oral)   Resp 18   SpO2 94%  Gen:   Awake, no distress   Resp:  Normal effort  MSK:   Moves extremities without difficulty  Other:  Edema to bilateral lower extremities.  No tenderness on palpation.  Patient has swelling and erythema to right great toe.  Medical Decision Making  Medically screening exam initiated at 5:42 PM.  Appropriate orders placed.  ZERENITY BOWRON was informed that the remainder of the evaluation will be completed by another provider, this initial triage assessment does not replace that evaluation, and the importance of remaining in the ED until their evaluation is complete.     Loni Beckwith, PA-C 11/18/20 1743    Isla Pence, MD 11/18/20 [phone]

## 2020-11-18 NOTE — ED Triage Notes (Signed)
Pt c/o L foot/ankle swelling, R great toe swelling x3 days. Pt advises that swelling in legs/ankles is normal but is usually resolved throughout the day & is not normally this swollen. Hx CHF, DM, HTN. Pt soaked R great toe in epson salt, no relief. Pt endorses "a little" SHOB

## 2020-11-19 DIAGNOSIS — I129 Hypertensive chronic kidney disease with stage 1 through stage 4 chronic kidney disease, or unspecified chronic kidney disease: Secondary | ICD-10-CM | POA: Diagnosis not present

## 2020-11-19 DIAGNOSIS — Z7901 Long term (current) use of anticoagulants: Secondary | ICD-10-CM | POA: Diagnosis not present

## 2020-11-19 DIAGNOSIS — L03031 Cellulitis of right toe: Secondary | ICD-10-CM | POA: Diagnosis not present

## 2020-11-19 DIAGNOSIS — I4892 Unspecified atrial flutter: Secondary | ICD-10-CM | POA: Diagnosis not present

## 2020-11-19 DIAGNOSIS — I509 Heart failure, unspecified: Secondary | ICD-10-CM | POA: Diagnosis not present

## 2020-11-19 DIAGNOSIS — R600 Localized edema: Secondary | ICD-10-CM | POA: Diagnosis not present

## 2020-11-19 DIAGNOSIS — E1129 Type 2 diabetes mellitus with other diabetic kidney complication: Secondary | ICD-10-CM | POA: Diagnosis not present

## 2020-11-19 NOTE — ED Notes (Signed)
Pt left with family member due to wait time. Pt stated they would call PCP in AM.

## 2020-11-28 ENCOUNTER — Other Ambulatory Visit: Payer: Self-pay

## 2020-11-28 ENCOUNTER — Ambulatory Visit (INDEPENDENT_AMBULATORY_CARE_PROVIDER_SITE_OTHER): Payer: Medicare Other | Admitting: Podiatry

## 2020-11-28 ENCOUNTER — Other Ambulatory Visit: Payer: Self-pay | Admitting: *Deleted

## 2020-11-28 ENCOUNTER — Encounter: Payer: Self-pay | Admitting: Podiatry

## 2020-11-28 DIAGNOSIS — L603 Nail dystrophy: Secondary | ICD-10-CM

## 2020-11-28 NOTE — Patient Outreach (Signed)
[facility]) Care Management  11/28/2020  Allison Duffy 09/20/1926 [id number]  Unsuccessful outreach attempt made to patient. RN Health Coach left HIPAA compliant voicemail message along with her contact information.  Plan: RN Health Coach will call patient within the month of December.  Emelia Loron RN, BSN [place] [phone] [email or web address]

## 2020-11-28 NOTE — Progress Notes (Signed)
Subjective:  Patient ID: Allison Duffy, female    DOB: 11/15/1926,  MRN: [id number]  Chief Complaint  Patient presents with   Nail Problem    Right hallux nail concern     87 y.o. female presents with the above complaint.  Patient presents with right hallux toenail contusion.  Patient states slightly painful there is some blood trapped underneath it.  She is on Eliquis.  She does not recall any Injury to the area.  She wanted get it evaluated.  Her she is a diabetic with last A1c of 6.3%.  She denies any other acute issues she would like to discuss treatment options for this.   Review of Systems: Negative except as noted in the HPI. Denies N/V/F/Ch.  Past Medical History:  Diagnosis Date   Acute on chronic combined systolic and diastolic CHF (congestive heart failure) ([place]) 01/28/2019   Arthritis    "scattered joints" (07/29/2013)   Chest pain    Colitis    Gout attack ?1980's   Hyperlipidemia    Hypertension    PONV (postoperative nausea and vomiting)    Type II diabetes mellitus (HCC)     Current Outpatient Medications:    amLODipine (NORVASC) 10 MG tablet, Take 10 mg by mouth at bedtime., Disp: , Rfl:    apixaban (ELIQUIS) 5 MG TABS tablet, TAKE 1 TABLET BY MOUTH TWICE DAILY, Disp: 180 tablet, Rfl: 1   aspirin EC 81 MG tablet, Take 1 tablet (81 mg total) by mouth daily. Swallow whole., Disp: 150 tablet, Rfl: 2   carboxymethylcellulose 1 % ophthalmic solution, Place 1 drop into both eyes 2 (two) times daily., Disp: , Rfl:    carvedilol (COREG) 12.5 MG tablet, TAKE 1 TABLET (12.5 MG TOTAL) BY MOUTH TWO TIMES DAILY WITH A MEAL., Disp: 60 tablet, Rfl: 11   cholecalciferol (VITAMIN D) 1000 UNITS tablet, Take 1,000 Units by mouth daily with lunch., Disp: , Rfl:    cholestyramine (QUESTRAN) 4 g packet, Take 4 g by mouth as needed., Disp: , Rfl:    cloNIDine (CATAPRES) 0.1 MG tablet, TAKE 2 TABLETS BY MOUTH EVERY MORNING AND 1 AT BEDTIME, Disp: 270 tablet, Rfl: 3   furosemide (LASIX)  40 MG tablet, Take 1 tablet (40 mg total) by mouth daily. Take 1 additional tablet daily if increased swelling, shortness of breath, or weight gain 5 lbs or more in 2 days (Patient taking differently: Take 40 mg by mouth daily.), Disp: 30 tablet, Rfl: 0   glipiZIDE (GLUCOTROL) 10 MG tablet, Take 10 mg by mouth daily before breakfast., Disp: , Rfl:    Lancets (ONETOUCH ULTRASOFT) lancets, use to check blood sugar twice a day as directed, Disp: , Rfl:    metFORMIN (GLUCOPHAGE) 500 MG tablet, Take 1 tablet (500 mg total) by mouth 2 (two) times daily with a meal., Disp: , Rfl:    Multiple Vitamin (MULTIVITAMIN WITH MINERALS) TABS, Take 1 tablet by mouth daily with supper., Disp: , Rfl:    nitroGLYCERIN (NITROSTAT) 0.4 MG SL tablet, PLACE 1 TABLET (0.4 MG TOTAL) UNDER THE TONGUE EVERY FIVE MINUTES X 3 DOSES AS NEEDED FOR CHEST PAIN., Disp: 25 tablet, Rfl: 1   olmesartan (BENICAR) 40 MG tablet, Take 40 mg by mouth at bedtime., Disp: , Rfl:    omeprazole (PRILOSEC) 40 MG capsule, Take 1 capsule (40 mg total) by mouth daily with supper., Disp: 30 capsule, Rfl: 11   omeprazole (PRILOSEC) 40 MG capsule, TAKE 1 CAPSULE (40 MG TOTAL) BY MOUTH DAILY  WITH SUPPER., Disp: 30 capsule, Rfl: 11   ONETOUCH ULTRA test strip, USE AS DIRECTED TO TEST BLOOD SUGAR TWICE DAILY, Disp: , Rfl:    potassium chloride SA (KLOR-CON) 20 MEQ tablet, Take 1 tablet (20 mEq total) by mouth daily., Disp: 30 tablet, Rfl: 11   potassium chloride SA (KLOR-CON) 20 MEQ tablet, TAKE 1 TABLET (20 MEQ TOTAL) BY MOUTH DAILY., Disp: 30 tablet, Rfl: 11   Probiotic Product (PRO-BIOTIC BLEND) CAPS, Take 1 capsule by mouth at bedtime. Takes OTC probiotic, Disp: , Rfl:    ranolazine (RANEXA) 500 MG 12 hr tablet, Take 1 tablet (500 mg total) by mouth 2 (two) times daily., Disp: 30 tablet, Rfl: 11   ranolazine (RANEXA) 500 MG 12 hr tablet, TAKE 1 TABLET (500 MG TOTAL) BY MOUTH TWO TIMES DAILY., Disp: 60 tablet, Rfl: 11   rosuvastatin (CRESTOR) 20 MG  tablet, Take 1 tablet (20 mg total) by mouth at bedtime., Disp: 30 tablet, Rfl: 11   rosuvastatin (CRESTOR) 20 MG tablet, TAKE 1 TABLET (20 MG TOTAL) BY MOUTH AT BEDTIME., Disp: 30 tablet, Rfl: 11   sitaGLIPtin (JANUVIA) 100 MG tablet, Take 100 mg by mouth daily with lunch., Disp: , Rfl:    Turmeric (QC TUMERIC COMPLEX PO), Take by mouth as needed. Patient reports takes OTC as needed, Disp: , Rfl:    White Petrolatum-Mineral Oil (ARTIFICIAL TEARS) ointment, Place into both eyes as needed., Disp: , Rfl:   Social History   Tobacco Use  Smoking Status Former   Years: 5.00   Types: Cigarettes   Quit date: 07/05/1953   Years since quitting: 67.4  Smokeless Tobacco Never    Allergies  Allergen Reactions   Codeine Nausea And Vomiting   Hydrocodone     Nausea and vomiting   Sulfa Antibiotics Itching and Nausea Only   Objective:  There were no vitals filed for this visit. There is no height or weight on file to calculate BMI. Constitutional Well developed. Well nourished.  Vascular Dorsalis pedis pulses palpable bilaterally. Posterior tibial pulses palpable bilaterally. Capillary refill normal to all digits.  No cyanosis or clubbing noted. Pedal hair growth normal.  Neurologic Normal speech. Oriented to person, place, and time. Epicritic sensation to light touch grossly present bilaterally.  Dermatologic Pain on palpation of the entire/total nail on 1st digit of the right No other open wounds. No skin lesions.  Orthopedic: Normal joint ROM without pain or crepitus bilaterally. No visible deformities. No bony tenderness.   Radiographs: None Assessment:   1. Nail dystrophy    Plan:  Patient was evaluated and treated and all questions answered.  Nail contusion/dystrophy hallux, right -Patient elects to proceed with minor surgery to remove entire toenail today. Consent reviewed and signed by patient. -Entire/total nail excised. See procedure note. -Educated on post-procedure  care including soaking. Written instructions provided and reviewed. -Patient to follow up in 2 weeks for nail check.  Procedure: Excision of entire/total nail  Location: Right 1st toe digit Anesthesia: Lidocaine 1% plain; 1.5 mL and Marcaine 0.5% plain; 1.5 mL, digital block. Skin Prep: Betadine. Dressing: Silvadene; telfa; dry, sterile, compression dressing. Technique: Following skin prep, the toe was exsanguinated and a tourniquet was secured at the base of the toe. The affected nail border was freed and excised. The tourniquet was then removed and sterile dressing applied. Disposition: Patient tolerated procedure well. Patient to return in 2 weeks for follow-up.   No follow-ups on file.

## 2020-12-05 ENCOUNTER — Ambulatory Visit (INDEPENDENT_AMBULATORY_CARE_PROVIDER_SITE_OTHER): Payer: Medicare Other | Admitting: Podiatry

## 2020-12-05 ENCOUNTER — Other Ambulatory Visit: Payer: Self-pay

## 2020-12-05 DIAGNOSIS — L603 Nail dystrophy: Secondary | ICD-10-CM

## 2020-12-12 NOTE — Progress Notes (Signed)
Subjective: LUTISHA KNOCHE is a 87 y.o.  female returns to office today for follow up evaluation after having right 1st total nail avulsion performed. Patient has been soaking using epsom salt and applying topical antibiotic covered with bandaid daily. Patient denies fevers, chills, nausea, vomiting. Denies any calf pain, chest pain, SOB.   Objective:  Vitals: Reviewed  General: Well developed, nourished, in no acute distress, alert and oriented x3   Dermatology: Skin is warm, dry and supple bilateral. right 1st nail bed appears to be clean, dry, with mild granular tissue and surrounding scab. There is no surrounding erythema, edema, drainage/purulence. The remaining nails appear unremarkable at this time. There are no other lesions or other signs of infection present.  Neurovascular status: Intact. No lower extremity swelling; No pain with calf compression bilateral.  Musculoskeletal: Decreased tenderness to palpation of the right 1st nail bed. Muscular strength within normal limits bilateral.   Assesement and Plan: S/p total nail avulsion, doing well.   -Continue soaking in epsom salts twice a day followed by antibiotic ointment and a band-aid. Can leave uncovered at night. Continue this until completely healed.  -If the area has not healed in 2 weeks, call the office for follow-up appointment, or sooner if any problems arise.  -Monitor for any signs/symptoms of infection. Call the office immediately if any occur or go directly to the emergency room. Call with any questions/concerns.  Boneta Lucks, DPM

## 2021-01-01 DIAGNOSIS — H353131 Nonexudative age-related macular degeneration, bilateral, early dry stage: Secondary | ICD-10-CM | POA: Diagnosis not present

## 2021-01-01 DIAGNOSIS — Z961 Presence of intraocular lens: Secondary | ICD-10-CM | POA: Diagnosis not present

## 2021-01-01 DIAGNOSIS — H02411 Mechanical ptosis of right eyelid: Secondary | ICD-10-CM | POA: Diagnosis not present

## 2021-01-01 DIAGNOSIS — H04123 Dry eye syndrome of bilateral lacrimal glands: Secondary | ICD-10-CM | POA: Diagnosis not present

## 2021-01-03 ENCOUNTER — Encounter: Payer: Self-pay | Admitting: *Deleted

## 2021-01-03 ENCOUNTER — Other Ambulatory Visit: Payer: Self-pay | Admitting: *Deleted

## 2021-01-03 NOTE — Patient Outreach (Signed)
[facility]) Care Management  01/03/2021  SOL ODOR 30-May-1926 [id number]  [facility]) Care Management RN Health Coach Note   01/03/2021 Name:  Allison Duffy MRN:  [id number] DOB:  1927/01/12  Summary: Patient reports that she is doing well. She continues to monitor her weight and B/P daily and records the values. Patient states that her weight has been stable at around 170 pounds with no concerning weight gain or CHF symptoms. Patient states her B/P runs in the systolic 878- occasionally 180. Nurse encouraged patient to discuss the high values with Dr. Gwenlyn Found during her upcoming visit on 01/08/21. Patient explains that her home environment is safe and that she is well supported by her daughter and family.  Recommendations/Changes made from today's visit: bring recorded weight and B/P values to upcoming cardiology appointment on 01/08/21 Ask Dr. Gwenlyn Found what B/P target he would like for you to have and when to call his office for concerning values  Subjective: Allison Duffy is an 87 y.o. year old female who is a primary patient of Burnard Bunting, MD. The care management team was consulted for assistance with care management and/or care coordination needs.    RN Health Coach completed Telephone Visit today.   Objective:  Medications Reviewed Today     Reviewed by Michiel Cowboy, RN (Registered Nurse) on 01/03/21 at 5  Med List Status: <None>   Medication Order Taking? Sig Documenting Provider Last Dose Status Informant  amLODipine (NORVASC) 10 MG tablet [id number] Yes Take 10 mg by mouth at bedtime. [provider] Taking Active Self  apixaban (ELIQUIS) 5 MG TABS tablet [id number] Yes TAKE 1 TABLET BY MOUTH TWICE DAILY Marilynn Rail Jossie Ng, NP Taking Active   aspirin EC 81 MG tablet [id number] Yes Take 1 tablet (81 mg total) by mouth daily. Swallow whole. Leonie Man, MD Taking Active   carboxymethylcellulose 1 % ophthalmic  solution [id number] Yes Place 1 drop into both eyes 2 (two) times daily. [provider] Taking Active Self  carvedilol (COREG) 12.5 MG tablet [id number] Yes TAKE 1 TABLET (12.5 MG TOTAL) BY MOUTH TWO TIMES DAILY WITH A MEAL. Leonie Man, MD Taking Active   cholecalciferol (VITAMIN D) 1000 UNITS tablet [id number] Yes Take 1,000 Units by mouth daily with lunch. [provider] Taking Active Self  cholestyramine (QUESTRAN) 4 g packet [id number] Yes Take 4 g by mouth as needed. [provider] Taking Active Self           Med Note Carnella Gu[place]ara Jun 15, 2019  2:45 PM)    cloNIDine (CATAPRES) 0.1 MG tablet [id number] Yes TAKE 2 TABLETS BY MOUTH EVERY MORNING AND 1 AT BEDTIME Deberah Pelton, NP Taking Active   furosemide (LASIX) 40 MG tablet [id number]  Take 1 tablet (40 mg total) by mouth daily. Take 1 additional tablet daily if increased swelling, shortness of breath, or weight gain 5 lbs or more in 2 days  Patient taking differently: Take 40 mg by mouth daily.   Emeterio Reeve, DO  Expired 01/29/20 2359 Self           Med Note Richardson Landry, [name]   Wed Jun 15, 2019  2:45 PM)    glipiZIDE (GLUCOTROL) 10 MG tablet [id number] Yes Take 10 mg by mouth daily before breakfast. Burnard Bunting, MD Taking Active Self  Lancets [facility] ULTRASOFT) lancets [id number] Yes use to check blood sugar twice a day as directed [provider]  Taking Active   metFORMIN (GLUCOPHAGE) 500 MG tablet [id number] Yes Take 1 tablet (500 mg total) by mouth 2 (two) times daily with a meal. Leonie Man, MD Taking Active   Multiple Vitamin (MULTIVITAMIN WITH MINERALS) TABS [id number] Yes Take 1 tablet by mouth daily with supper. [provider] Taking Active Self  nitroGLYCERIN (NITROSTAT) 0.4 MG SL tablet [id number] Yes PLACE 1 TABLET (0.4 MG TOTAL) UNDER THE TONGUE EVERY FIVE MINUTES X 3 DOSES AS NEEDED FOR CHEST PAIN. Leonie Man, MD Taking Active   olmesartan  [facility]) 40 MG tablet [id number] Yes Take 40 mg by mouth at bedtime. Burnard Bunting, MD Taking Active Self  omeprazole (PRILOSEC) 40 MG capsule [id number] Yes Take 1 capsule (40 mg total) by mouth daily with supper. Leonie Man, MD Taking Active   omeprazole (PRILOSEC) 40 MG capsule [id number] Yes TAKE 1 CAPSULE (40 MG TOTAL) BY MOUTH DAILY WITH SUPPER. Leonie Man, MD Taking Active   [facility] ULTRA test strip [id number] Yes USE AS DIRECTED TO TEST BLOOD SUGAR TWICE DAILY [provider] Taking Active   potassium chloride SA (KLOR-CON) 20 MEQ tablet [id number] Yes Take 1 tablet (20 mEq total) by mouth daily. Leonie Man, MD Taking Active   potassium chloride SA (KLOR-CON) 20 MEQ tablet [id number] Yes TAKE 1 TABLET (20 MEQ TOTAL) BY MOUTH DAILY. Leonie Man, MD Taking Active   Probiotic Product (PRO-BIOTIC BLEND) CAPS [id number] Yes Take 1 capsule by mouth at bedtime. Takes OTC probiotic Burnard Bunting, MD Taking Active Self  ranolazine (RANEXA) 500 MG 12 hr tablet [id number] Yes Take 1 tablet (500 mg total) by mouth 2 (two) times daily. Leonie Man, MD Taking Active   ranolazine (RANEXA) 500 MG 12 hr tablet [id number] Yes TAKE 1 TABLET (500 MG TOTAL) BY MOUTH TWO TIMES DAILY. Leonie Man, MD Taking Active   rosuvastatin (CRESTOR) 20 MG tablet [id number] Yes Take 1 tablet (20 mg total) by mouth at bedtime. Leonie Man, MD Taking Active   rosuvastatin (CRESTOR) 20 MG tablet [id number] Yes TAKE 1 TABLET (20 MG TOTAL) BY MOUTH AT BEDTIME. Leonie Man, MD Taking Active   sitaGLIPtin [facility]) 100 MG tablet [id number] Yes Take 100 mg by mouth daily with lunch. [provider] Taking Active Self  Turmeric (QC TUMERIC COMPLEX PO) [id number] Yes Take by mouth as needed. Patient reports takes OTC as needed Burnard Bunting, MD Taking Active Self  White Petrolatum-Mineral Oil (ARTIFICIAL TEARS) ointment [id number] Yes Place into both eyes as needed. Burnard Bunting, MD Taking Active Self             SDOH:  (Social Determinants of Health) assessments and interventions performed: SDOH assessments completed today and documented in the Epic system.    Care Plan  Review of patient past medical history, allergies, medications, health status, including review of consultants reports, laboratory and other test data, was performed as part of comprehensive evaluation for care management services.   Care Plan : Wellness (Adult)  Updates made by Michiel Cowboy, RN since 01/03/2021 12:00 AM     Problem: Healthy Nutrition (Wellness) Resolved 01/03/2021  Priority: High     Care Plan : Heart Failure (Adult)  Updates made by Michiel Cowboy, RN since 01/03/2021 12:00 AM     Problem: Symptom Exacerbation (Heart Failure) Resolved 01/03/2021  Priority: High     Problem: Disease Progression (Heart Failure) Resolved 01/03/2021  Priority: Medium     Problem: Activity Tolerance (Heart Failure)  Resolved 01/03/2021  Priority: High     Care Plan : Hypertension (Adult)  Updates made by Michiel Cowboy, RN since 01/03/2021 12:00 AM     Problem: Hypertension (Hypertension) Resolved 01/03/2021  Priority: High     Problem: Disease Progression (Hypertension) Resolved 01/03/2021  Priority: High     Care Plan : RN Care Manager Plan of Care  Updates made by Michiel Cowboy, RN since 01/03/2021 12:00 AM     Problem: Knowledge Deficit Related to CHF and Hypertension   Priority: High     Long-Range Goal: Development of Plan of Care for Management of CHF and Hypertension   Start Date: 01/03/2021  Expected End Date: 01/25/2022  Note:   Current Barriers:  Chronic Disease Management support and education needs related to CHF and HTN   RNCM Clinical Goal(s):  Patient will take all medications exactly as prescribed and will call provider for medication related questions as evidenced by Patient's verbalization that she takes her prescribed medications as written and  that she contacts her providers for questions and concerns demonstrate Ongoing adherence to prescribed treatment plan for CHF and HTN as evidenced by continuation of monitoring weight and B/P daily, recording the values, and contacting providers as needed continue to work with RN Care Manager to address care management and care coordination needs related to  CHF and HTN as evidenced by adherence to CM Team Scheduled appointments through collaboration with RN Care manager, provider, and care team.   Interventions: Inter-disciplinary care team collaboration (see longitudinal plan of care) Evaluation of current treatment plan related to  self management and patient's adherence to plan as established by provider Discussed bringing recorded weight and B/P values to upcoming cardiology appointment on 01/08/21 Discussed patient asking Dr. Gwenlyn Found what B/P target he would like for her to have and when to call his office for concerning values Nurse will send color zones and rescue action plans for CHF; will send Hypertension education regarding high and low values   Heart Failure Interventions:  (Status:  Goal on track:  Yes.) Long Term Goal Provided education on low sodium diet Reviewed Heart Failure Action Plan in depth and provided written copy Provided education about placing scale on hard, flat surface Advised patient to weigh each morning after emptying bladder Discussed importance of daily weight and advised patient to weigh and record daily Screening for signs and symptoms of depression related to chronic disease state  Encouraged patient not to sit for long periods and to continue to stay active by maintaining her home Discussed bringing recorded weight and B/P values to upcoming cardiology appointment on 01/08/21  Hypertension Interventions:  (Status:  Goal on track:  Yes.) Long Term Goal Last practice recorded BP readings:  BP Readings from Last 3 Encounters:  11/19/20 (!) 175/71  05/02/20  (!) 163/68  04/13/20 125/67  Most recent eGFR/CrCl: No results found for: EGFR  No components found for: CRCL  Reviewed medications with patient and discussed importance of compliance Discussed plans with patient for ongoing care management follow up and provided patient with direct contact information for care management team Advised patient, providing education and rationale, to monitor blood pressure daily and record, calling PCP for findings outside established parameters Reviewed scheduled/upcoming provider appointments including:  Advised patient to discuss B/P target goal and when to call for concerning values  with provider Provided education on prescribed diet low sodium Encouraged patient not to sit for long periods and to continue to stay active by maintaining her  home Discussed bringing recorded weight and B/P values to upcoming cardiology appointment on 01/08/21  Patient Goals/Self-Care Activities: Take all medications as prescribed Attend all scheduled provider appointments Attend church or other social activities Call provider office for new concerns or questions  call office if I gain more than 2 pounds in one day or 5 pounds in one week keep legs up while sitting track weight in diary use salt in moderation weigh myself daily bring diary to all appointments follow rescue plan if symptoms flare-up eat more whole grains, fruits and vegetables, lean meats and healthy fats Do not sit for long periods and continue to stay active by maintaining your home check blood pressure daily write blood pressure results in a log or diary take blood pressure log to all doctor appointments call doctor for signs and symptoms of high blood pressure keep all doctor appointments take medications for blood pressure exactly as prescribed Eat a low sodium diet bring recorded weight and B/P values to upcoming cardiology appointment on 01/08/21 Ask Dr. Gwenlyn Found what B/P target he would like for  you to have and when to call his office for concerning values  Follow Up Plan:  Telephone follow up appointment with care management team member scheduled for:  February       Plan: Telephone follow up appointment with care management team member scheduled for:  February. Nurse will send PCP a barrier letter and today's assessment note; will send patient CHF and hypertension education.   Emelia Loron RN, [place] [phone] [email or web address]

## 2021-01-03 NOTE — Patient Instructions (Addendum)
Visit Information  Thank you for taking time to visit with me today. Please don't hesitate to contact me if I can be of assistance to you before our next scheduled telephone appointment.  Following are the goals we discussed today:  Patient Goals/Self-Care Activities: Take all medications as prescribed Attend all scheduled provider appointments Attend church or other social activities Call provider office for new concerns or questions  call office if I gain more than 2 pounds in one day or 5 pounds in one week keep legs up while sitting track weight in diary use salt in moderation weigh myself daily bring diary to all appointments follow rescue plan if symptoms flare-up eat more whole grains, fruits and vegetables, lean meats and healthy fats Do not sit for long periods and continue to stay active by maintaining your home check blood pressure daily write blood pressure results in a log or diary take blood pressure log to all doctor appointments call doctor for signs and symptoms of high blood pressure keep all doctor appointments take medications for blood pressure exactly as prescribed Eat a low sodium diet bring recorded weight and B/P values to upcoming cardiology appointment on 01/08/21 Ask Dr. Gwenlyn Found what B/P target he would like for you to have and when to call his office for concerning values   The patient verbalized understanding of instructions, educational materials, and care plan provided today and agreed to receive a mailed copy of patient instructions, educational materials, and care plan.   Telephone follow up appointment with care management team member scheduled [id number]  Emelia Loron RN, [place] [phone] [email or web address]

## 2021-01-08 ENCOUNTER — Encounter: Payer: Self-pay | Admitting: Cardiovascular Disease

## 2021-01-08 ENCOUNTER — Ambulatory Visit (INDEPENDENT_AMBULATORY_CARE_PROVIDER_SITE_OTHER): Payer: Medicare Other | Admitting: Cardiovascular Disease

## 2021-01-08 ENCOUNTER — Other Ambulatory Visit: Payer: Self-pay

## 2021-01-08 VITALS — BP 178/66 | HR 64 | Ht 65.0 in | Wt 168.0 lb

## 2021-01-08 DIAGNOSIS — E785 Hyperlipidemia, unspecified: Secondary | ICD-10-CM | POA: Diagnosis not present

## 2021-01-08 DIAGNOSIS — I340 Nonrheumatic mitral (valve) insufficiency: Secondary | ICD-10-CM | POA: Diagnosis not present

## 2021-01-08 DIAGNOSIS — I5032 Chronic diastolic (congestive) heart failure: Secondary | ICD-10-CM

## 2021-01-08 DIAGNOSIS — I4892 Unspecified atrial flutter: Secondary | ICD-10-CM | POA: Diagnosis not present

## 2021-01-08 DIAGNOSIS — I10 Essential (primary) hypertension: Secondary | ICD-10-CM

## 2021-01-08 DIAGNOSIS — E1169 Type 2 diabetes mellitus with other specified complication: Secondary | ICD-10-CM

## 2021-01-08 DIAGNOSIS — I2510 Atherosclerotic heart disease of native coronary artery without angina pectoris: Secondary | ICD-10-CM | POA: Diagnosis not present

## 2021-01-08 NOTE — Assessment & Plan Note (Signed)
History of nonobstructive CAD by cath which I performed during her recent hospitalization in March.  She had 50% LAD and 75% diagonal branch stenosis.  She denies chest pain.

## 2021-01-08 NOTE — Assessment & Plan Note (Signed)
History of atrial fibs/flutter in the past currently in sinus rhythm on Eliquis oral anticoagulation.

## 2021-01-08 NOTE — Assessment & Plan Note (Signed)
History chronic diastolic heart failure on furosemide.

## 2021-01-08 NOTE — Assessment & Plan Note (Signed)
History of hyperlipidemia on statin therapy with lipid profile performed 04/12/2020 revealing total cholesterol 124, LDL of 56 and HDL 37.

## 2021-01-08 NOTE — Progress Notes (Signed)
01/08/2021 Allison Duffy   [birth year]  [id number]  Primary Physician Burnard Bunting, MD Primary Cardiologist: Lorretta Harp MD [name], [place]  HPI:  Allison Duffy is a 87 y.o.  mildly overweight widowed Caucasian female mother of 3 living daughters (one deceased), grandmother a grandchildren. Her daughter Allison Duffy accompanies her today who I have seen as a patient remotely as well.  I last saw her in the office 09/27/2019.  She was referred by Dr. Reynaldo Minium for cardiovascular evaluation because of new onset effort angina or shortness of breath and fatigue.her cardiac risk factor profile is notable for treated hypertension, diabetes and hyperlipidemia. She does not smoke. There is no family history. She has never had a heart attack or stroke. Over the last 6 months she's noticed new onset effort angina, dyspnea and fatigue. The symptoms do not occur at rest. They do not awaken her from sleep she had a 2D echo performed 07/20/2013 with cyst which was essentially normal as was a Myoview stress test back in.    She was admitted to the hospital 01/27/2019 with combined systolic and diastolic heart failure, atrial fibrillation.  She was diuresed, rate controlled and placed on oral anticoagulation.  She still complains of some dyspnea on exertion.  She weighs herself on a daily basis and avoid salt.  2D echo was performed that showed preserved LV function with moderate to severe mitral vegetation.     She performs her activities of daily living with minimal limitation.  She does live alone.  She still drives short distances and does her daily housework.  She gets occasionally short of breath but not so much that it affects her quality of life.   Since I saw her in the office over a year ago she was admitted to the hospital this past March with chest pain 04/11/2020.  I performed cardiac catheterization on her revealing noncritical CAD.  She currently gets rare atypical chest  pain.  Current Meds  Medication Sig   amLODipine (NORVASC) 10 MG tablet Take 10 mg by mouth at bedtime.   apixaban (ELIQUIS) 5 MG TABS tablet TAKE 1 TABLET BY MOUTH TWICE DAILY   aspirin EC 81 MG tablet Take 1 tablet (81 mg total) by mouth daily. Swallow whole.   carboxymethylcellulose 1 % ophthalmic solution Place 1 drop into both eyes 2 (two) times daily.   carvedilol (COREG) 12.5 MG tablet TAKE 1 TABLET (12.5 MG TOTAL) BY MOUTH TWO TIMES DAILY WITH A MEAL.   cholecalciferol (VITAMIN D) 1000 UNITS tablet Take 1,000 Units by mouth daily with lunch.   cholestyramine (QUESTRAN) 4 g packet Take 4 g by mouth as needed.   cloNIDine (CATAPRES) 0.1 MG tablet TAKE 2 TABLETS BY MOUTH EVERY MORNING AND 1 AT BEDTIME   furosemide (LASIX) 40 MG tablet Take 1 tablet (40 mg total) by mouth daily. Take 1 additional tablet daily if increased swelling, shortness of breath, or weight gain 5 lbs or more in 2 days (Patient taking differently: Take 40 mg by mouth daily.)   glipiZIDE (GLUCOTROL) 10 MG tablet Take 10 mg by mouth daily before breakfast.   Lancets (ONETOUCH ULTRASOFT) lancets use to check blood sugar twice a day as directed   metFORMIN (GLUCOPHAGE) 500 MG tablet Take 1 tablet (500 mg total) by mouth 2 (two) times daily with a meal.   Multiple Vitamin (MULTIVITAMIN WITH MINERALS) TABS Take 1 tablet by mouth daily with supper.   nitroGLYCERIN (NITROSTAT) 0.4 MG SL  tablet PLACE 1 TABLET (0.4 MG TOTAL) UNDER THE TONGUE EVERY FIVE MINUTES X 3 DOSES AS NEEDED FOR CHEST PAIN.   olmesartan (BENICAR) 40 MG tablet Take 40 mg by mouth at bedtime.   omeprazole (PRILOSEC) 40 MG capsule Take 1 capsule (40 mg total) by mouth daily with supper.   ONETOUCH ULTRA test strip USE AS DIRECTED TO TEST BLOOD SUGAR TWICE DAILY   potassium chloride SA (KLOR-CON) 20 MEQ tablet Take 1 tablet (20 mEq total) by mouth daily.   Probiotic Product (PRO-BIOTIC BLEND) CAPS Take 1 capsule by mouth at bedtime. Takes OTC probiotic    ranolazine (RANEXA) 500 MG 12 hr tablet Take 1 tablet (500 mg total) by mouth 2 (two) times daily.   rosuvastatin (CRESTOR) 20 MG tablet Take 1 tablet (20 mg total) by mouth at bedtime.   sitaGLIPtin (JANUVIA) 100 MG tablet Take 100 mg by mouth daily with lunch.   Turmeric (QC TUMERIC COMPLEX PO) Take by mouth as needed. Patient reports takes OTC as needed   White Petrolatum-Mineral Oil (ARTIFICIAL TEARS) ointment Place into both eyes as needed.     Allergies  Allergen Reactions   Codeine Nausea And Vomiting   Hydrocodone     Nausea and vomiting   Prednisone     Other reaction(s): sick on her stomach   Sulfa Antibiotics Itching, Nausea Only and Other (See Comments)    Social History   Socioeconomic History   Marital status: Widowed    Spouse name: 4 children and 3 living   Number of children: 4   Years of education: Not on file   Highest education level: Not on file  Occupational History   Occupation: Retired  Tobacco Use   Smoking status: Former    Years: 5.00    Types: Cigarettes    Quit date: 07/05/1953    Years since quitting: 67.5   Smokeless tobacco: Never  Vaping Use   Vaping Use: Never used  Substance and Sexual Activity   Alcohol use: No   Drug use: No   Sexual activity: Never  Other Topics Concern   Not on file  Social History Narrative   Not on file   Social Determinants of Health   Financial Resource Strain: Not on file  Food Insecurity: No Food Insecurity   Worried About Running Out of Food in the Last Year: Never true   [place] in the Last Year: Never true  Transportation Needs: No Transportation Needs   Lack of Transportation (Medical): No   Lack of Transportation (Non-Medical): No  Physical Activity: Not on file  Stress: Not on file  Social Connections: Not on file  Intimate Partner Violence: Not on file     Review of Systems: General: negative for chills, fever, night sweats or weight changes.  Cardiovascular: negative for chest  pain, dyspnea on exertion, edema, orthopnea, palpitations, paroxysmal nocturnal dyspnea or shortness of breath Dermatological: negative for rash Respiratory: negative for cough or wheezing Urologic: negative for hematuria Abdominal: negative for nausea, vomiting, diarrhea, bright red blood per rectum, melena, or hematemesis Neurologic: negative for visual changes, syncope, or dizziness All other systems reviewed and are otherwise negative except as noted above.    Blood pressure (!) 178/66, pulse 64, height 5\' 5"  (1.651 m), weight 168 lb (76.2 kg), SpO2 96 %.  General appearance: alert and no distress Neck: no adenopathy, no carotid bruit, no JVD, supple, symmetrical, trachea midline, and thyroid not enlarged, symmetric, no tenderness/mass/nodules Lungs: clear to auscultation  bilaterally Heart: regular rate and rhythm, S1, S2 normal, no murmur, click, rub or gallop Extremities: extremities normal, atraumatic, no cyanosis or edema Pulses: 2+ and symmetric Skin: Skin color, texture, turgor normal. No rashes or lesions Neurologic: Grossly normal  EKG sinus rhythm at 64 without ST or T wave changes.  Personally reviewed this EKG.  ASSESSMENT AND PLAN:   Essential hypertension History of essential hypertension a blood pressure measured today at 178/66.  She is on amlodipine, clonidine and Benicar.  I reviewed her blood pressure log at home and she is running on the high side however at this point I do not want to add any additional medications.  Hyperlipidemia associated with type 2 diabetes mellitus ([place]) History of hyperlipidemia on statin therapy with lipid profile performed 04/12/2020 revealing total cholesterol 124, LDL of 56 and HDL 37.  Chronic diastolic CHF (congestive heart failure) (HCC) History chronic diastolic heart failure on furosemide.  Atrial flutter by electrocardiogram [facility]) History of atrial fibs/flutter in the past currently in sinus rhythm on Eliquis oral  anticoagulation.  Mitral regurgitation History of moderate to severe MR in the past however recent echo performed 04/12/2020 showed normal LV systolic function with only mild MR.  Coronary artery disease History of nonobstructive CAD by cath which I performed during her recent hospitalization in March.  She had 50% LAD and 75% diagonal branch stenosis.  She denies chest pain.     Lorretta Harp MD FACP,FACC,FAHA, [facility] 01/08/2021 10:42 AM

## 2021-01-08 NOTE — Patient Instructions (Signed)

## 2021-01-08 NOTE — Assessment & Plan Note (Signed)
History of essential hypertension a blood pressure measured today at 178/66.  She is on amlodipine, clonidine and Benicar.  I reviewed her blood pressure log at home and she is running on the high side however at this point I do not want to add any additional medications.

## 2021-01-08 NOTE — Assessment & Plan Note (Signed)
History of moderate to severe MR in the past however recent echo performed 04/12/2020 showed normal LV systolic function with only mild MR.

## 2021-01-24 ENCOUNTER — Other Ambulatory Visit: Payer: Self-pay | Admitting: Cardiology

## 2021-01-24 DIAGNOSIS — I10 Essential (primary) hypertension: Secondary | ICD-10-CM

## 2021-01-24 DIAGNOSIS — E785 Hyperlipidemia, unspecified: Secondary | ICD-10-CM

## 2021-01-24 DIAGNOSIS — I200 Unstable angina: Secondary | ICD-10-CM

## 2021-02-13 DIAGNOSIS — I509 Heart failure, unspecified: Secondary | ICD-10-CM | POA: Diagnosis not present

## 2021-02-13 DIAGNOSIS — I129 Hypertensive chronic kidney disease with stage 1 through stage 4 chronic kidney disease, or unspecified chronic kidney disease: Secondary | ICD-10-CM | POA: Diagnosis not present

## 2021-02-13 DIAGNOSIS — E663 Overweight: Secondary | ICD-10-CM | POA: Diagnosis not present

## 2021-02-13 DIAGNOSIS — Z7901 Long term (current) use of anticoagulants: Secondary | ICD-10-CM | POA: Diagnosis not present

## 2021-02-13 DIAGNOSIS — E1129 Type 2 diabetes mellitus with other diabetic kidney complication: Secondary | ICD-10-CM | POA: Diagnosis not present

## 2021-02-13 DIAGNOSIS — I700 Atherosclerosis of aorta: Secondary | ICD-10-CM | POA: Diagnosis not present

## 2021-02-22 ENCOUNTER — Other Ambulatory Visit: Payer: Self-pay | Admitting: General Practice

## 2021-02-22 NOTE — Telephone Encounter (Signed)
Prescription refill request for Eliquis received. Indication:Aflutter Last office visit:12/22 Scr:1.3 Age: 88 Weight:76.2 kg  Prescription refilled

## 2021-03-14 ENCOUNTER — Other Ambulatory Visit: Payer: Medicare Other | Admitting: *Deleted

## 2021-03-14 NOTE — Patient Outreach (Signed)
[facility]) Care Management  03/14/2021  [name] Jul 07, 1926 [id number]  [facility]) Care Management RN Health Coach Note   03/14/2021 Name:  Allison Duffy MRN:  [id number] DOB:  April 13, 1926  Summary: Patient reports that she is doing well. Her weight remains stable and she currently reports no symptoms of CHF. Patient explains that her recent B/P ranges are 150 to occasional 924 systolic; adding that higher ranges are not unusual for her. Nurse provided education, encouraged the patient to notify her providers, ask what they want her B/P goal/target to be, and ask when to call her providers for concerning values. Patient states her home environment is safe and that she is well supported by her family. Patient did not have any further questions or concerns today and did confirm that she has this nurse's contact number to call her if needed.   Recommendations/Changes made from today's visit: Do not sit for long periods, continue to stay active by maintaining your home, and by doing exercises while holding on to the kitchen counter bring recorded weight, B/P, and blood sugar values to all doctors appointments Ask Dr. Gwenlyn Found and or Dr. Reynaldo Minium what B/P target they would like for you to have and when to call the office for concerning values  Subjective: Allison Duffy is an 88 y.o. year old female who is a primary patient of Burnard Bunting, MD. The care management team was consulted for assistance with care management and/or care coordination needs.    RN Health Coach completed Telephone Visit today.   Objective:  Medications Reviewed Today     Reviewed by Michiel Cowboy, RN (Registered Nurse) on 03/14/21 at 1422  Med List Status: <None>   Medication Order Taking? Sig Documenting Provider Last Dose Status Informant  amLODipine (NORVASC) 10 MG tablet [id number] Yes Take 10 mg by mouth at bedtime. [provider] Taking Active Self   aspirin EC 81 MG tablet [id number] Yes Take 1 tablet (81 mg total) by mouth daily. Swallow whole. Leonie Man, MD Taking Active   carboxymethylcellulose 1 % ophthalmic solution [id number] Yes Place 1 drop into both eyes 2 (two) times daily. [provider] Taking Active Self  carvedilol (COREG) 12.5 MG tablet [id number] Yes TAKE 1 TABLET (12.5 MG TOTAL) BY MOUTH TWO TIMES DAILY WITH A MEAL. Leonie Man, MD Taking Active   cholecalciferol (VITAMIN D) 1000 UNITS tablet [id number] Yes Take 1,000 Units by mouth daily with lunch. [provider] Taking Active Self  cholestyramine (QUESTRAN) 4 g packet [id number] Yes Take 4 g by mouth as needed. [provider] Taking Active Self           Med Note Carnella Gu[place] Jun 15, 2019  2:45 PM)    cloNIDine (CATAPRES) 0.1 MG tablet [id number] Yes TAKE 2 TABLETS BY MOUTH EVERY MORNING AND 1 AT BEDTIME Deberah Pelton, NP Taking Active   ELIQUIS 5 MG TABS tablet [id number] Yes TAKE 1 TABLET BY MOUTH TWICE DAILY Cleaver, Jossie Ng, NP Taking Active   furosemide (LASIX) 40 MG tablet [id number]  Take 1 tablet (40 mg total) by mouth daily. Take 1 additional tablet daily if increased swelling, shortness of breath, or weight gain 5 lbs or more in 2 days  Patient taking differently: Take 40 mg by mouth daily.   Emeterio Reeve, DO  Expired 01/08/21 2359 Self           Med Note (Duffy, Allison N  Wed Jun 15, 2019  2:45 PM)    glipiZIDE (GLUCOTROL) 10 MG tablet [id number] Yes Take 10 mg by mouth daily before breakfast. Burnard Bunting, MD Taking Active Self  Lancets [facility] ULTRASOFT) lancets [id number] Yes use to check blood sugar twice a day as directed [provider] Taking Active   metFORMIN (GLUCOPHAGE) 500 MG tablet [id number] Yes Take 1 tablet (500 mg total) by mouth 2 (two) times daily with a meal. Leonie Man, MD Taking Active   Multiple Vitamin (MULTIVITAMIN WITH MINERALS) TABS [id number] Yes Take 1  tablet by mouth daily with supper. [provider] Taking Active Self  nitroGLYCERIN (NITROSTAT) 0.4 MG SL tablet [id number] Yes PLACE 1 TABLET (0.4 MG TOTAL) UNDER THE TONGUE EVERY FIVE MINUTES X 3 DOSES AS NEEDED FOR CHEST PAIN. Leonie Man, MD Taking Active   olmesartan [facility]) 40 MG tablet [id number] Yes Take 40 mg by mouth at bedtime. Burnard Bunting, MD Taking Active Self  omeprazole (PRILOSEC) 40 MG capsule [id number] Yes Take 1 capsule (40 mg total) by mouth daily with supper. Leonie Man, MD Taking Active   omeprazole (PRILOSEC) 40 MG capsule [id number] Yes TAKE 1 CAPSULE (40 MG TOTAL) BY MOUTH DAILY WITH SUPPER. Leonie Man, MD Taking Active   [facility] ULTRA test strip [id number] Yes USE AS DIRECTED TO TEST BLOOD SUGAR TWICE DAILY [provider] Taking Active   potassium chloride SA (KLOR-CON) 20 MEQ tablet [id number] Yes Take 1 tablet (20 mEq total) by mouth daily. Leonie Man, MD Taking Active   potassium chloride SA (KLOR-CON) 20 MEQ tablet [id number] Yes TAKE 1 TABLET (20 MEQ TOTAL) BY MOUTH DAILY. Leonie Man, MD Taking Active   Probiotic Product (PRO-BIOTIC BLEND) CAPS [id number] Yes Take 1 capsule by mouth at bedtime. Takes OTC probiotic Burnard Bunting, MD Taking Active Self  ranolazine (RANEXA) 500 MG 12 hr tablet [id number] Yes TAKE 1 TABLET BY MOUTH TWICE DAILY Lorretta Harp, MD Taking Active   rosuvastatin (CRESTOR) 20 MG tablet [id number] Yes TAKE 1 TABLET BY MOUTH EVERY NIGHT AT BEDTIME Lorretta Harp, MD Taking Active   sitaGLIPtin (JANUVIA) 100 MG tablet [id number] Yes Take 100 mg by mouth daily with lunch. [provider] Taking Active Self  Turmeric (QC TUMERIC COMPLEX PO) [id number] Yes Take by mouth as needed. Patient reports takes OTC as needed Burnard Bunting, MD Taking Active Self  White Petrolatum-Mineral Oil (ARTIFICIAL TEARS) ointment [id number] Yes Place into both eyes as needed. Burnard Bunting, MD Taking Active  Self             SDOH:  (Social Determinants of Health) assessments and interventions performed: SDOH assessments completed today and documented in the Epic system.     Care Plan  Review of patient past medical history, allergies, medications, health status, including review of consultants reports, laboratory and other test data, was performed as part of comprehensive evaluation for care management services.   Care Plan : RN Care Manager Plan of Care  Updates made by Michiel Cowboy, RN since 03/14/2021 12:00 AM     Problem: Knowledge Deficit Related to CHF and Hypertension   Priority: High     Long-Range Goal: Development of Plan of Care for Management of CHF and Hypertension   Start Date: 01/03/2021  Expected End Date: 01/25/2022  Note:   Current Barriers:  Chronic Disease Management support and education needs related to CHF and HTN   RNCM Clinical Goal(s):  Patient will take all medications exactly as  prescribed and will call provider for medication related questions as evidenced by Patient's verbalization that she takes her prescribed medications as written and that she contacts her providers for questions and concerns demonstrate Ongoing adherence to prescribed treatment plan for CHF and HTN as evidenced by continuation of monitoring weight and B/P daily, recording the values, and contacting providers as needed continue to work with [place] to address care management and care coordination needs related to  CHF and HTN as evidenced by adherence to CM Team Scheduled appointments through collaboration with RN Care manager, provider, and care team.   Interventions: Inter-disciplinary care team collaboration (see longitudinal plan of care) Evaluation of current treatment plan related to  self management and patient's adherence to plan as established by provider Discussed bringing recorded weight and B/P values to upcoming cardiology appointment on 01/08/21 Discussed  patient asking Dr. Gwenlyn Found what B/P target he would like for her to have and when to call his office for concerning values Nurse will send color zones and rescue action plans for CHF; will send Hypertension education regarding high and low values   Heart Failure Interventions:  (Status:  Goal on track:  Yes.) Long Term Goal Provided education on low sodium diet Reviewed Heart Failure Action Plan in depth and provided written copy Provided education about placing scale on hard, flat surface Advised patient to weigh each morning after emptying bladder Discussed importance of daily weight and advised patient to weigh and record daily Screening for signs and symptoms of depression related to chronic disease state  Encouraged patient not to sit for long periods and to continue to stay active by maintaining her home Discussed bringing recorded weight and B/P values to all provider appointments  Hypertension Interventions:  (Status:  Goal on track:  Yes.) Long Term Goal Last practice recorded BP readings:  BP Readings from Last 3 Encounters:  11/19/20 (!) 175/71  05/02/20 (!) 163/68  04/13/20 125/67  Most recent eGFR/CrCl: No results found for: EGFR  No components found for: CRCL  Reviewed medications with patient and discussed importance of compliance Discussed plans with patient for ongoing care management follow up and provided patient with direct contact information for care management team Advised patient, providing education and rationale, to monitor blood pressure daily and record, calling PCP for findings outside established parameters Reviewed scheduled/upcoming provider appointments including:  Advised patient to discuss B/P target goal and when to call for concerning values  with provider Provided education on prescribed diet low sodium Encouraged patient not to sit for long periods and to continue to stay active by maintaining her home Encouraged continuation of doing exercises while  holding on to kitchen counter 3-4 times a week Discussed bringing recorded weight and B/P values to upcoming cardiology appointment on 01/08/21  Patient Goals/Self-Care Activities: Take all medications as prescribed Attend all scheduled provider appointments Attend church or other social activities Call provider office for new concerns or questions  call office if I gain more than 2 pounds in one day or 5 pounds in one week keep legs up while sitting track weight in diary use salt in moderation weigh myself daily follow rescue plan if symptoms flare-up eat more whole grains, fruits and vegetables, lean meats and healthy fats Do not sit for long periods, continue to stay active by maintaining your home, and by doing exercises while holding on to the kitchen counter check blood pressure daily write blood pressure results in a log or diary call doctor for signs and  symptoms of high blood pressure keep all doctor appointments take medications for blood pressure exactly as prescribed Eat a low sodium diet bring recorded weight, B/P, and blood sugar values to all doctors appointments Ask Dr. Gwenlyn Found and or Dr. Reynaldo Minium what B/P target they would like for you to have and when to call the office for concerning values  Follow Up Plan:  Telephone follow up appointment with care management team member scheduled for:  May       Plan: Telephone follow up appointment with care management team member scheduled for:  May. Nurse will send PCP a quarterly update.  Emelia Loron RN, [place] [phone] [email or web address]

## 2021-03-14 NOTE — Patient Instructions (Signed)
Visit Information  Thank you for taking time to visit with me today. Please don't hesitate to contact me if I can be of assistance to you before our next scheduled telephone appointment.  Following are the goals we discussed today:  Patient Goals/Self-Care Activities: Take all medications as prescribed Attend all scheduled provider appointments Attend church or other social activities Call provider office for new concerns or questions  call office if I gain more than 2 pounds in one day or 5 pounds in one week keep legs up while sitting track weight in diary use salt in moderation weigh myself daily follow rescue plan if symptoms flare-up eat more whole grains, fruits and vegetables, lean meats and healthy fats Do not sit for long periods, continue to stay active by maintaining your home, and by doing exercises while holding on to the kitchen counter check blood pressure daily write blood pressure results in a log or diary call doctor for signs and symptoms of high blood pressure keep all doctor appointments take medications for blood pressure exactly as prescribed Eat a low sodium diet bring recorded weight, B/P, and blood sugar values to all doctors appointments Ask Dr. Gwenlyn Found and or Dr. Reynaldo Minium what B/P target they would like for you to have and when to call the office for concerning values  The patient verbalized understanding of instructions, educational materials, and care plan provided today and agreed to receive a mailed copy of patient instructions, educational materials, and care plan.   Telephone follow up appointment with care management team member scheduled for: May  Amos Gaber [place] RN, [place] [phone] [email or web address]

## 2021-03-18 ENCOUNTER — Ambulatory Visit: Payer: Medicare Other | Admitting: *Deleted

## 2021-03-29 ENCOUNTER — Emergency Department (HOSPITAL_COMMUNITY): Payer: Medicare Other

## 2021-03-29 ENCOUNTER — Inpatient Hospital Stay (HOSPITAL_COMMUNITY)
Admission: EM | Admit: 2021-03-29 | Discharge: 2021-04-03 | DRG: 193 | Disposition: A | Discharge status: Skilled Nursing Facility | Payer: Medicare Other | Attending: Family Medicine | Admitting: Family Medicine

## 2021-03-29 DIAGNOSIS — J440 Chronic obstructive pulmonary disease with acute lower respiratory infection: Secondary | ICD-10-CM | POA: Diagnosis present

## 2021-03-29 DIAGNOSIS — I2510 Atherosclerotic heart disease of native coronary artery without angina pectoris: Secondary | ICD-10-CM | POA: Diagnosis present

## 2021-03-29 DIAGNOSIS — R197 Diarrhea, unspecified: Secondary | ICD-10-CM | POA: Diagnosis not present

## 2021-03-29 DIAGNOSIS — A419 Sepsis, unspecified organism: Secondary | ICD-10-CM | POA: Diagnosis not present

## 2021-03-29 DIAGNOSIS — R079 Chest pain, unspecified: Secondary | ICD-10-CM | POA: Diagnosis not present

## 2021-03-29 DIAGNOSIS — E872 Acidosis, unspecified: Secondary | ICD-10-CM | POA: Diagnosis not present

## 2021-03-29 DIAGNOSIS — E785 Hyperlipidemia, unspecified: Secondary | ICD-10-CM | POA: Diagnosis present

## 2021-03-29 DIAGNOSIS — E118 Type 2 diabetes mellitus with unspecified complications: Secondary | ICD-10-CM | POA: Diagnosis not present

## 2021-03-29 DIAGNOSIS — E1165 Type 2 diabetes mellitus with hyperglycemia: Secondary | ICD-10-CM | POA: Diagnosis not present

## 2021-03-29 DIAGNOSIS — R0603 Acute respiratory distress: Secondary | ICD-10-CM | POA: Diagnosis not present

## 2021-03-29 DIAGNOSIS — E876 Hypokalemia: Secondary | ICD-10-CM | POA: Diagnosis present

## 2021-03-29 DIAGNOSIS — I248 Other forms of acute ischemic heart disease: Secondary | ICD-10-CM | POA: Diagnosis not present

## 2021-03-29 DIAGNOSIS — I259 Chronic ischemic heart disease, unspecified: Secondary | ICD-10-CM

## 2021-03-29 DIAGNOSIS — I10 Essential (primary) hypertension: Secondary | ICD-10-CM | POA: Diagnosis present

## 2021-03-29 DIAGNOSIS — I2583 Coronary atherosclerosis due to lipid rich plaque: Secondary | ICD-10-CM | POA: Diagnosis not present

## 2021-03-29 DIAGNOSIS — E890 Postprocedural hypothyroidism: Secondary | ICD-10-CM | POA: Diagnosis present

## 2021-03-29 DIAGNOSIS — I110 Hypertensive heart disease with heart failure: Secondary | ICD-10-CM | POA: Diagnosis present

## 2021-03-29 DIAGNOSIS — Z882 Allergy status to sulfonamides status: Secondary | ICD-10-CM

## 2021-03-29 DIAGNOSIS — Z961 Presence of intraocular lens: Secondary | ICD-10-CM | POA: Diagnosis present

## 2021-03-29 DIAGNOSIS — Z79899 Other long term (current) drug therapy: Secondary | ICD-10-CM | POA: Diagnosis not present

## 2021-03-29 DIAGNOSIS — Z7901 Long term (current) use of anticoagulants: Secondary | ICD-10-CM | POA: Diagnosis not present

## 2021-03-29 DIAGNOSIS — J9601 Acute respiratory failure with hypoxia: Principal | ICD-10-CM

## 2021-03-29 DIAGNOSIS — Z9841 Cataract extraction status, right eye: Secondary | ICD-10-CM

## 2021-03-29 DIAGNOSIS — Z7984 Long term (current) use of oral hypoglycemic drugs: Secondary | ICD-10-CM

## 2021-03-29 DIAGNOSIS — Z9842 Cataract extraction status, left eye: Secondary | ICD-10-CM

## 2021-03-29 DIAGNOSIS — I517 Cardiomegaly: Secondary | ICD-10-CM | POA: Diagnosis not present

## 2021-03-29 DIAGNOSIS — J9600 Acute respiratory failure, unspecified whether with hypoxia or hypercapnia: Secondary | ICD-10-CM | POA: Diagnosis not present

## 2021-03-29 DIAGNOSIS — R6520 Severe sepsis without septic shock: Secondary | ICD-10-CM | POA: Diagnosis not present

## 2021-03-29 DIAGNOSIS — R0789 Other chest pain: Secondary | ICD-10-CM | POA: Diagnosis not present

## 2021-03-29 DIAGNOSIS — Z8249 Family history of ischemic heart disease and other diseases of the circulatory system: Secondary | ICD-10-CM

## 2021-03-29 DIAGNOSIS — Z90710 Acquired absence of both cervix and uterus: Secondary | ICD-10-CM

## 2021-03-29 DIAGNOSIS — R059 Cough, unspecified: Secondary | ICD-10-CM | POA: Diagnosis not present

## 2021-03-29 DIAGNOSIS — I480 Paroxysmal atrial fibrillation: Secondary | ICD-10-CM | POA: Diagnosis not present

## 2021-03-29 DIAGNOSIS — I509 Heart failure, unspecified: Secondary | ICD-10-CM | POA: Diagnosis not present

## 2021-03-29 DIAGNOSIS — R509 Fever, unspecified: Secondary | ICD-10-CM | POA: Diagnosis not present

## 2021-03-29 DIAGNOSIS — R739 Hyperglycemia, unspecified: Secondary | ICD-10-CM | POA: Diagnosis not present

## 2021-03-29 DIAGNOSIS — R64 Cachexia: Secondary | ICD-10-CM | POA: Diagnosis not present

## 2021-03-29 DIAGNOSIS — I5031 Acute diastolic (congestive) heart failure: Secondary | ICD-10-CM | POA: Diagnosis not present

## 2021-03-29 DIAGNOSIS — Z833 Family history of diabetes mellitus: Secondary | ICD-10-CM

## 2021-03-29 DIAGNOSIS — Z7401 Bed confinement status: Secondary | ICD-10-CM | POA: Diagnosis not present

## 2021-03-29 DIAGNOSIS — J189 Pneumonia, unspecified organism: Secondary | ICD-10-CM

## 2021-03-29 DIAGNOSIS — Z885 Allergy status to narcotic agent status: Secondary | ICD-10-CM | POA: Diagnosis not present

## 2021-03-29 DIAGNOSIS — Z20822 Contact with and (suspected) exposure to covid-19: Secondary | ICD-10-CM | POA: Diagnosis not present

## 2021-03-29 DIAGNOSIS — E1169 Type 2 diabetes mellitus with other specified complication: Secondary | ICD-10-CM | POA: Diagnosis present

## 2021-03-29 DIAGNOSIS — I5033 Acute on chronic diastolic (congestive) heart failure: Secondary | ICD-10-CM

## 2021-03-29 DIAGNOSIS — Z888 Allergy status to other drugs, medicaments and biological substances status: Secondary | ICD-10-CM

## 2021-03-29 DIAGNOSIS — Z9049 Acquired absence of other specified parts of digestive tract: Secondary | ICD-10-CM

## 2021-03-29 DIAGNOSIS — J449 Chronic obstructive pulmonary disease, unspecified: Secondary | ICD-10-CM | POA: Diagnosis not present

## 2021-03-29 DIAGNOSIS — Z6827 Body mass index (BMI) 27.0-27.9, adult: Secondary | ICD-10-CM

## 2021-03-29 DIAGNOSIS — K580 Irritable bowel syndrome with diarrhea: Secondary | ICD-10-CM | POA: Diagnosis not present

## 2021-03-29 DIAGNOSIS — I5032 Chronic diastolic (congestive) heart failure: Secondary | ICD-10-CM | POA: Diagnosis not present

## 2021-03-29 DIAGNOSIS — Z7982 Long term (current) use of aspirin: Secondary | ICD-10-CM

## 2021-03-29 DIAGNOSIS — Z87891 Personal history of nicotine dependence: Secondary | ICD-10-CM

## 2021-03-29 DIAGNOSIS — J811 Chronic pulmonary edema: Secondary | ICD-10-CM | POA: Diagnosis not present

## 2021-03-29 DIAGNOSIS — R609 Edema, unspecified: Secondary | ICD-10-CM | POA: Diagnosis not present

## 2021-03-29 DIAGNOSIS — K219 Gastro-esophageal reflux disease without esophagitis: Secondary | ICD-10-CM | POA: Diagnosis not present

## 2021-03-29 DIAGNOSIS — R0902 Hypoxemia: Secondary | ICD-10-CM | POA: Diagnosis not present

## 2021-03-29 HISTORY — DX: Pneumonia, unspecified organism: J18.9

## 2021-03-29 HISTORY — DX: Acidosis, unspecified: E87.20

## 2021-03-29 HISTORY — DX: Acute respiratory failure with hypoxia: J96.01

## 2021-03-29 LAB — GLUCOSE, CAPILLARY
Glucose-Capillary: 219 mg/dL — ABNORMAL HIGH (ref 70–99)
Glucose-Capillary: 133 mg/dL — ABNORMAL HIGH (ref 70–99)

## 2021-03-29 LAB — URINALYSIS, ROUTINE W REFLEX MICROSCOPIC
Bilirubin Urine: NEGATIVE
Glucose, UA: 50 mg/dL — AB
Hgb urine dipstick: NEGATIVE
Ketones, ur: 5 mg/dL — AB
Leukocytes,Ua: NEGATIVE
Nitrite: NEGATIVE
Protein, ur: 100 mg/dL — AB
Specific Gravity, Urine: 1.015 (ref 1.005–1.030)
pH: 5 (ref 5.0–8.0)

## 2021-03-29 LAB — CBC WITH DIFFERENTIAL/PLATELET
Abs Immature Granulocytes: 0.05 10*3/uL (ref 0.00–0.07)
Basophils Absolute: 0 10*3/uL (ref 0.0–0.1)
Basophils Relative: 1 %
Eosinophils Absolute: 0 10*3/uL (ref 0.0–0.5)
Eosinophils Relative: 0 %
HCT: 33.2 % — ABNORMAL LOW (ref 36.0–46.0)
Hemoglobin: 11.8 g/dL — ABNORMAL LOW (ref 12.0–15.0)
Immature Granulocytes: 1 %
Lymphocytes Relative: 13 %
Lymphs Abs: 0.9 10*3/uL (ref 0.7–4.0)
MCH: 34.2 pg — ABNORMAL HIGH (ref 26.0–34.0)
MCHC: 35.5 g/dL (ref 30.0–36.0)
MCV: 96.2 fL (ref 80.0–100.0)
Monocytes Absolute: 0.7 10*3/uL (ref 0.1–1.0)
Monocytes Relative: 9 %
Neutro Abs: 5.4 10*3/uL (ref 1.7–7.7)
Neutrophils Relative %: 76 %
Platelets: 174 10*3/uL (ref 150–400)
RBC: 3.45 MIL/uL — ABNORMAL LOW (ref 3.87–5.11)
RDW: 11.7 % (ref 11.5–15.5)
WBC: 7.1 10*3/uL (ref 4.0–10.5)
nRBC: 0 % (ref 0.0–0.2)

## 2021-03-29 LAB — FIBRINOGEN: Fibrinogen: 553 mg/dL — ABNORMAL HIGH (ref 210–475)

## 2021-03-29 LAB — COMPREHENSIVE METABOLIC PANEL
ALT: 21 U/L (ref 0–44)
AST: 26 U/L (ref 15–41)
Albumin: 3.2 g/dL — ABNORMAL LOW (ref 3.5–5.0)
Alkaline Phosphatase: 37 U/L — ABNORMAL LOW (ref 38–126)
Anion gap: 11 (ref 5–15)
BUN: 15 mg/dL (ref 8–23)
CO2: 22 mmol/L (ref 22–32)
Calcium: 8.4 mg/dL — ABNORMAL LOW (ref 8.9–10.3)
Chloride: 100 mmol/L (ref 98–111)
Creatinine, Ser: 1 mg/dL (ref 0.44–1.00)
GFR, Estimated: 53 mL/min — ABNORMAL LOW (ref >60)
Glucose, Bld: 283 mg/dL — ABNORMAL HIGH (ref 70–99)
Potassium: 2.9 mmol/L — ABNORMAL LOW (ref 3.5–5.1)
Sodium: 133 mmol/L — ABNORMAL LOW (ref 135–145)
Total Bilirubin: 0.7 mg/dL (ref 0.3–1.2)
Total Protein: 6.3 g/dL — ABNORMAL LOW (ref 6.5–8.1)

## 2021-03-29 LAB — LACTIC ACID, PLASMA
Lactic Acid, Venous: 3.1 mmol/L (ref 0.5–1.9)
Lactic Acid, Venous: 3.2 mmol/L (ref 0.5–1.9)

## 2021-03-29 LAB — TROPONIN I (HIGH SENSITIVITY): Troponin I (High Sensitivity): 38 ng/L — ABNORMAL HIGH (ref <18)

## 2021-03-29 LAB — FERRITIN: Ferritin: 428 ng/mL — ABNORMAL HIGH (ref 11–307)

## 2021-03-29 LAB — PROCALCITONIN: Procalcitonin: <0.1 ng/mL

## 2021-03-29 LAB — RESP PANEL BY RT-PCR (FLU A&B, COVID) ARPGX2
Influenza A by PCR: NEGATIVE
Influenza B by PCR: NEGATIVE
SARS Coronavirus 2 by RT PCR: NEGATIVE

## 2021-03-29 LAB — HEMOGLOBIN A1C
Hgb A1c MFr Bld: 5.9 % — ABNORMAL HIGH (ref 4.8–5.6)
Mean Plasma Glucose: 122.63 mg/dL

## 2021-03-29 LAB — D-DIMER, QUANTITATIVE: D-Dimer, Quant: 0.53 ug/mL-FEU — ABNORMAL HIGH (ref 0.00–0.50)

## 2021-03-29 LAB — LACTATE DEHYDROGENASE: LDH: 147 U/L (ref 98–192)

## 2021-03-29 LAB — TRIGLYCERIDES: Triglycerides: 87 mg/dL (ref <150)

## 2021-03-29 LAB — C-REACTIVE PROTEIN: CRP: 1.2 mg/dL — ABNORMAL HIGH (ref <1.0)

## 2021-03-29 MED ORDER — CARVEDILOL 12.5 MG PO TABS
12.5000 mg | ORAL_TABLET | Freq: Two times a day (BID) | ORAL | Status: DC
Start: 2021-03-29 — End: 2021-04-03
  Administered 2021-03-29 – 2021-04-03 (×9): 12.5 mg via ORAL
  Filled 2021-03-29 (×9): qty 1

## 2021-03-29 MED ORDER — ONDANSETRON HCL 4 MG PO TABS
4.0000 mg | ORAL_TABLET | Freq: Four times a day (QID) | ORAL | Status: DC | PRN
  Administered 2021-03-29 (×2): 4 mg via ORAL
  Filled 2021-03-29 (×2): qty 1

## 2021-03-29 MED ORDER — PRO-BIOTIC BLEND PO CAPS: 1.0000 | ORAL_CAPSULE | Freq: Every day | ORAL | Status: DC

## 2021-03-29 MED ORDER — LACTATED RINGERS IV SOLN: INTRAVENOUS | Status: DC

## 2021-03-29 MED ORDER — SODIUM CHLORIDE 0.9 % IV SOLN
500.0000 mg | INTRAVENOUS | Status: DC
  Filled 2021-03-29: qty 5

## 2021-03-29 MED ORDER — POTASSIUM CHLORIDE CRYS ER 20 MEQ PO TBCR
40.0000 meq | EXTENDED_RELEASE_TABLET | Freq: Once | ORAL | Status: AC
  Administered 2021-03-29: 40 meq via ORAL
  Filled 2021-03-29: qty 2

## 2021-03-29 MED ORDER — CLONIDINE HCL 0.1 MG PO TABS: 0.1000 mg | ORAL_TABLET | ORAL | Status: DC

## 2021-03-29 MED ORDER — ASPIRIN EC 81 MG PO TBEC
81.0000 mg | DELAYED_RELEASE_TABLET | Freq: Every day | ORAL | Status: DC
  Administered 2021-03-29 – 2021-04-03 (×6): 81 mg via ORAL
  Filled 2021-03-29 (×6): qty 1

## 2021-03-29 MED ORDER — ALBUTEROL SULFATE (2.5 MG/3ML) 0.083% IN NEBU
2.5000 mg | INHALATION_SOLUTION | Freq: Four times a day (QID) | RESPIRATORY_TRACT | Status: DC | PRN
  Administered 2021-03-29 – 2021-03-30 (×2): 2.5 mg via RESPIRATORY_TRACT
  Filled 2021-03-29: qty 3

## 2021-03-29 MED ORDER — CLONIDINE HCL 0.1 MG PO TABS
0.1000 mg | ORAL_TABLET | Freq: Every day | ORAL | Status: DC
  Administered 2021-03-29 – 2021-04-02 (×5): 0.1 mg via ORAL
  Filled 2021-03-29 (×5): qty 1

## 2021-03-29 MED ORDER — MAGNESIUM SULFATE IN D5W 1-5 GM/100ML-% IV SOLN
1.0000 g | Freq: Once | INTRAVENOUS | Status: AC
  Administered 2021-03-29: 1 g via INTRAVENOUS
  Filled 2021-03-29: qty 100

## 2021-03-29 MED ORDER — CHOLESTYRAMINE 4 G PO PACK
4.0000 g | PACK | Freq: Every day | ORAL | Status: DC | PRN
  Filled 2021-03-29: qty 1

## 2021-03-29 MED ORDER — HYDRALAZINE HCL 20 MG/ML IJ SOLN: 5.0000 mg | INTRAMUSCULAR | Status: DC | PRN

## 2021-03-29 MED ORDER — SODIUM CHLORIDE 0.9 % IV SOLN
2.0000 g | INTRAVENOUS | Status: AC
  Administered 2021-03-30 – 2021-04-02 (×4): 2 g via INTRAVENOUS
  Filled 2021-03-29 (×4): qty 20

## 2021-03-29 MED ORDER — INSULIN ASPART 100 UNIT/ML IJ SOLN
0.0000 [IU] | Freq: Three times a day (TID) | INTRAMUSCULAR | Status: DC
  Administered 2021-03-29 – 2021-03-30 (×2): 5 [IU] via SUBCUTANEOUS
  Administered 2021-03-30 (×2): 3 [IU] via SUBCUTANEOUS
  Administered 2021-03-31: 5 [IU] via SUBCUTANEOUS
  Administered 2021-03-31: 8 [IU] via SUBCUTANEOUS
  Administered 2021-03-31: 3 [IU] via SUBCUTANEOUS
  Administered 2021-04-01 (×2): 5 [IU] via SUBCUTANEOUS
  Administered 2021-04-01 – 2021-04-02 (×2): 3 [IU] via SUBCUTANEOUS
  Administered 2021-04-02 (×2): 5 [IU] via SUBCUTANEOUS
  Administered 2021-04-03: 3 [IU] via SUBCUTANEOUS

## 2021-03-29 MED ORDER — ACETAMINOPHEN 325 MG PO TABS
650.0000 mg | ORAL_TABLET | Freq: Four times a day (QID) | ORAL | Status: DC | PRN
  Administered 2021-03-29 – 2021-04-02 (×7): 650 mg via ORAL
  Filled 2021-03-29 (×8): qty 2

## 2021-03-29 MED ORDER — ROSUVASTATIN CALCIUM 20 MG PO TABS
20.0000 mg | ORAL_TABLET | Freq: Every day | ORAL | Status: DC
  Administered 2021-03-29 – 2021-04-02 (×5): 20 mg via ORAL
  Filled 2021-03-29 (×5): qty 1

## 2021-03-29 MED ORDER — GUAIFENESIN ER 600 MG PO TB12
600.0000 mg | ORAL_TABLET | Freq: Two times a day (BID) | ORAL | Status: DC
  Administered 2021-03-29 – 2021-04-03 (×11): 600 mg via ORAL
  Filled 2021-03-29 (×11): qty 1

## 2021-03-29 MED ORDER — RISAQUAD PO CAPS
1.0000 | ORAL_CAPSULE | Freq: Every day | ORAL | Status: DC
  Administered 2021-03-29 – 2021-04-02 (×5): 1 via ORAL
  Filled 2021-03-29 (×6): qty 1

## 2021-03-29 MED ORDER — ONDANSETRON HCL 4 MG/2ML IJ SOLN
4.0000 mg | Freq: Four times a day (QID) | INTRAMUSCULAR | Status: DC | PRN
  Administered 2021-03-30: 4 mg via INTRAVENOUS
  Filled 2021-03-29 (×3): qty 2

## 2021-03-29 MED ORDER — CLONIDINE HCL 0.1 MG PO TABS
0.2000 mg | ORAL_TABLET | Freq: Every day | ORAL | Status: DC
Start: 2021-03-29 — End: 2021-04-03
  Administered 2021-03-29 – 2021-04-03 (×6): 0.2 mg via ORAL
  Filled 2021-03-29 (×2): qty 2
  Filled 2021-03-29: qty 1
  Filled 2021-03-29: qty 2
  Filled 2021-03-29 (×2): qty 1
  Filled 2021-03-29: qty 2

## 2021-03-29 MED ORDER — POLYVINYL ALCOHOL 1.4 % OP SOLN
1.0000 [drp] | Freq: Two times a day (BID) | OPHTHALMIC | Status: DC
  Administered 2021-03-29 – 2021-04-03 (×9): 1 [drp] via OPHTHALMIC
  Filled 2021-03-29 (×2): qty 15

## 2021-03-29 MED ORDER — LACTATED RINGERS IV BOLUS
1000.0000 mL | Freq: Once | INTRAVENOUS | Status: AC
  Administered 2021-03-29: 1000 mL via INTRAVENOUS

## 2021-03-29 MED ORDER — FENTANYL CITRATE PF 50 MCG/ML IJ SOSY
25.0000 ug | PREFILLED_SYRINGE | INTRAMUSCULAR | Status: DC | PRN
  Administered 2021-03-29 (×2): 25 ug via INTRAVENOUS
  Filled 2021-03-29 (×2): qty 1

## 2021-03-29 MED ORDER — LOPERAMIDE HCL 2 MG PO CAPS
4.0000 mg | ORAL_CAPSULE | ORAL | Status: DC | PRN
  Administered 2021-03-29: 4 mg via ORAL
  Filled 2021-03-29: qty 2

## 2021-03-29 MED ORDER — RANOLAZINE ER 500 MG PO TB12
500.0000 mg | ORAL_TABLET | Freq: Two times a day (BID) | ORAL | Status: DC
  Administered 2021-03-29 – 2021-04-03 (×11): 500 mg via ORAL
  Filled 2021-03-29 (×12): qty 1

## 2021-03-29 MED ORDER — POTASSIUM CHLORIDE CRYS ER 20 MEQ PO TBCR
40.0000 meq | EXTENDED_RELEASE_TABLET | ORAL | Status: AC
  Administered 2021-03-29: 40 meq via ORAL
  Filled 2021-03-29: qty 2

## 2021-03-29 MED ORDER — SODIUM CHLORIDE 0.9 % IV SOLN
500.0000 mg | Freq: Once | INTRAVENOUS | Status: AC
  Administered 2021-03-29: 500 mg via INTRAVENOUS
  Filled 2021-03-29: qty 5

## 2021-03-29 MED ORDER — PANTOPRAZOLE SODIUM 40 MG PO TBEC
40.0000 mg | DELAYED_RELEASE_TABLET | Freq: Every day | ORAL | Status: DC
  Administered 2021-03-29 – 2021-04-03 (×6): 40 mg via ORAL
  Filled 2021-03-29 (×6): qty 1

## 2021-03-29 MED ORDER — APIXABAN 5 MG PO TABS
5.0000 mg | ORAL_TABLET | Freq: Two times a day (BID) | ORAL | Status: DC
Start: 2021-03-29 — End: 2021-04-03
  Administered 2021-03-29 – 2021-04-03 (×11): 5 mg via ORAL
  Filled 2021-03-29 (×11): qty 1

## 2021-03-29 MED ORDER — IRBESARTAN 150 MG PO TABS
300.0000 mg | ORAL_TABLET | Freq: Every day | ORAL | Status: DC
  Administered 2021-03-29 – 2021-04-03 (×6): 300 mg via ORAL
  Filled 2021-03-29 (×3): qty 2
  Filled 2021-03-29 (×2): qty 1
  Filled 2021-03-29: qty 2

## 2021-03-29 MED ORDER — SODIUM CHLORIDE 0.9 % IV SOLN
1.0000 g | Freq: Once | INTRAVENOUS | Status: AC
  Administered 2021-03-29: 1 g via INTRAVENOUS
  Filled 2021-03-29: qty 10

## 2021-03-29 MED ORDER — AMLODIPINE BESYLATE 10 MG PO TABS
10.0000 mg | ORAL_TABLET | Freq: Every day | ORAL | Status: DC
  Administered 2021-03-29 – 2021-04-02 (×5): 10 mg via ORAL
  Filled 2021-03-29 (×5): qty 1

## 2021-03-29 NOTE — Assessment & Plan Note (Addendum)
Patient appears to be compensated at this time.  Last EF noted to be 60 to 65% by echocardiogram in 03/2020. ?-Monitor intake and output and daily weight ?-Held furosemide due to diarrhea reports with low sodium ?-Reassess and determine when medically appropriate to resume furosemide p.o. ?

## 2021-03-29 NOTE — Assessment & Plan Note (Addendum)
Patient with prior history of atrial fibrillation/flutter appears to be currently in sinus rhythm. ? ?

## 2021-03-29 NOTE — Assessment & Plan Note (Deleted)
Patient with prior history of left heart cath in 03/2020 which noted proximal LAD to mid LAD lesion of 50% stenosis, second diagonal lesion 75% stenosis, ramus lesion 50% stenosed for which no culprit lesion was identified, and medical management was recommended. ?-Continue aspirin and statin ?

## 2021-03-29 NOTE — Assessment & Plan Note (Addendum)
On admission blood pressures elevated up to 180/49.  Home blood pressure regimen includes amlodipine 10 mg nightly, Coreg 12.5 mg twice daily, clonidine 0.1-0.2 mg twice daily, olmesartan 40 mg nightly, and Ranexa 500 twice daily mg. ?-Continue Coreg, amlodipine, clonidine, Ranexa, and pharmacy substitution of irbesartan for olmesartan ? ?

## 2021-03-29 NOTE — Assessment & Plan Note (Signed)
Patient reports recent bout of "colitis" where she has been having large bouts of diarrhea.  No prior history of C. Difficile or recent antibiotics given.  Reports being on cholestyramine for treatment and takes a daily probiotic. ?-Continue probiotic and cholestyramine ?

## 2021-03-29 NOTE — Assessment & Plan Note (Signed)
Acute.  Lactic acid elevated at 3.1 on admission.  Suspect possibly related to infection and/or dehydration from diarrhea. ?-Continue to trend lactic acid level ?

## 2021-03-29 NOTE — Assessment & Plan Note (Addendum)
Acute.  Patient did report having some complaints of chest pain, but at the time of the evaluation was not having ongoing symptoms.  Patient's D-dimer which is mildly elevated 0.53.  Lower suspicion for pulmonary embolus at this time as she is on anticoagulation.Patient with prior history of left heart cath in 03/2020 which noted proximal LAD to mid LAD lesion of 50% stenosis, second diagonal lesion 75% stenosis, ramus lesion 50% stenosed for which no culprit lesion was identified, and medical management was recommended. ?-Check troponin ?-Continue aspirin and statin ?

## 2021-03-29 NOTE — ED Provider Notes (Addendum)
[facility]  Provider Note  CSN: [id number] Arrival date & time: 03/29/21 4315  History Chief Complaint  Patient presents with   Fever    OPLE GIRGIS is a 88 y.o. female with history of HTN, DM, CHF reports 2 days of feeling poorly with cough, occasionally productive of sputum, fever, SOB and general malaise. She had some nausea but no vomiting. She had one episode of diarrhea this morning but no dysuria. She called EMS and was noted to be febrile to 103F, given APAP in the field and also found to have SpO2 85%, improved with supplemental O2. She does not usually wear oxygen at home.    Home Medications Prior to Admission medications   Medication Sig Start Date End Date Taking? Authorizing Provider  amLODipine (NORVASC) 10 MG tablet Take 10 mg by mouth at bedtime.   Yes [provider]  aspirin EC 81 MG tablet Take 1 tablet (81 mg total) by mouth daily. Swallow whole. 04/13/20 04/13/21 Yes Leonie Man, MD  carboxymethylcellulose 1 % ophthalmic solution Place 1 drop into both eyes 2 (two) times daily.   Yes [provider]  carvedilol (COREG) 12.5 MG tablet TAKE 1 TABLET (12.5 MG TOTAL) BY MOUTH TWO TIMES DAILY WITH A MEAL. Patient taking differently: Take 12.5 mg by mouth 2 (two) times daily with a meal. 04/13/20 04/13/21 Yes Leonie Man, MD  cholecalciferol (VITAMIN D) 1000 UNITS tablet Take 1,000 Units by mouth daily with lunch.   Yes [provider]  cholestyramine (QUESTRAN) 4 g packet Take 4 g by mouth daily as needed (IBS).   Yes [provider]  cloNIDine (CATAPRES) 0.1 MG tablet TAKE 2 TABLETS BY MOUTH EVERY MORNING AND 1 AT BEDTIME Patient taking differently: Take 0.1-0.2 mg by mouth See admin instructions. Take 1 tablet 0.1 mg in the AM and 2 tabs 0.2 mg with evening meal. 08/28/20  Yes Cleaver, Jossie Ng, NP  diphenhydrAMINE-Acetaminophen (CORICIDIN NIGHTTIME COLD-COUGH PO) Take 1 tablet by mouth  every 6 (six) hours as needed (cough/ cold symptoms).   Yes [provider]  ELIQUIS 5 MG TABS tablet TAKE 1 TABLET BY MOUTH TWICE DAILY Patient taking differently: Take 5 mg by mouth 2 (two) times daily. 02/22/21  Yes Cleaver, Jossie Ng, NP  furosemide (LASIX) 40 MG tablet Take 1 tablet (40 mg total) by mouth daily. Take 1 additional tablet daily if increased swelling, shortness of breath, or weight gain 5 lbs or more in 2 days Patient taking differently: Take 40 mg by mouth daily. 01/29/19 03/29/21 Yes Alexander, Natalie, DO  GLIPIZIDE XL 10 MG 24 hr tablet Take 10 mg by mouth every morning. 01/24/21  Yes [provider]  metFORMIN (GLUCOPHAGE) 500 MG tablet Take 1 tablet (500 mg total) by mouth 2 (two) times daily with a meal. 04/15/20  Yes Leonie Man, MD  Multiple Vitamin (MULTIVITAMIN WITH MINERALS) TABS Take 1 tablet by mouth daily with supper.   Yes [provider]  nitroGLYCERIN (NITROSTAT) 0.4 MG SL tablet PLACE 1 TABLET (0.4 MG TOTAL) UNDER THE TONGUE EVERY FIVE MINUTES X 3 DOSES AS NEEDED FOR CHEST PAIN. Patient taking differently: Place 0.4 mg under the tongue every 5 (five) minutes as needed for chest pain. 04/13/20 04/13/21 Yes Leonie Man, MD  olmesartan (BENICAR) 40 MG tablet Take 40 mg by mouth at bedtime.   Yes Burnard Bunting, MD  omeprazole (PRILOSEC) 40 MG capsule TAKE 1 CAPSULE (40 MG TOTAL)  BY MOUTH DAILY WITH SUPPER. Patient taking differently: 40 mg daily. 04/13/20 04/13/21 Yes Leonie Man, MD  potassium chloride SA (KLOR-CON) 20 MEQ tablet Take 1 tablet (20 mEq total) by mouth daily. 04/13/20  Yes Leonie Man, MD  Probiotic Product (PRO-BIOTIC BLEND) CAPS Take 1 capsule by mouth at bedtime. Takes OTC probiotic   Yes Burnard Bunting, MD  ranolazine (RANEXA) 500 MG 12 hr tablet TAKE 1 TABLET BY MOUTH TWICE DAILY Patient taking differently: Take 500 mg by mouth 2 (two) times daily. 01/24/21  Yes Lorretta Harp, MD  rosuvastatin (CRESTOR)  20 MG tablet TAKE 1 TABLET BY MOUTH EVERY NIGHT AT BEDTIME Patient taking differently: Take 20 mg by mouth at bedtime. 01/24/21  Yes Lorretta Harp, MD  sitaGLIPtin (JANUVIA) 100 MG tablet Take 100 mg by mouth daily with lunch.   Yes [provider]  Turmeric (QC TUMERIC COMPLEX PO) Take 500 mg by mouth daily as needed (allergies).   Yes Burnard Bunting, MD  Lancets [facility] ULTRASOFT) lancets use to check blood sugar twice a day as directed 12/18/11   [provider]  Zalma test strip USE AS DIRECTED TO TEST BLOOD SUGAR TWICE DAILY 06/29/19   [provider]     Allergies    Codeine, Hydrocodone, Prednisone, and Sulfa antibiotics   Review of Systems   Review of Systems Please see HPI for pertinent positives and negatives  Physical Exam BP (!) 164/51    Pulse (!) 58    Temp 99.8 F (37.7 C) (Oral)    Resp 16    Ht 5\' 5"  (1.651 m)    Wt 74.8 kg    SpO2 97%    BMI 27.46 kg/m   Physical Exam Vitals and nursing note reviewed.  Constitutional:      Appearance: Normal appearance.  HENT:     Head: Normocephalic and atraumatic.     Nose: Nose normal.     Mouth/Throat:     Mouth: Mucous membranes are moist.  Eyes:     Extraocular Movements: Extraocular movements intact.     Conjunctiva/sclera: Conjunctivae normal.  Cardiovascular:     Rate and Rhythm: Normal rate.  Pulmonary:     Effort: Pulmonary effort is normal.     Breath sounds: Normal breath sounds. No wheezing, rhonchi or rales.  Abdominal:     General: Abdomen is flat.     Palpations: Abdomen is soft.     Tenderness: There is no abdominal tenderness. There is no guarding.  Musculoskeletal:        General: No swelling. Normal range of motion.     Cervical back: Neck supple.     Right lower leg: No edema.     Left lower leg: No edema.  Skin:    General: Skin is warm and dry.  Neurological:     General: No focal deficit present.     Mental Status: She is alert.  Psychiatric:         Mood and Affect: Mood normal.    ED Results / Procedures / Treatments   EKG EKG Interpretation  Date/Time:  Friday March 29 2021 08:20:24 EST Ventricular Rate:  68 PR Interval:  225 QRS Duration: 91 QT Interval:  434 QTC Calculation: 462 R Axis:   20 Text Interpretation: Sinus rhythm Atrial premature complexes Prolonged PR interval Nonspecific T abnormalities, lateral leads No significant change since last tracing Confirmed by Calvert Cantor [id number]) on 03/29/2021 8:27:37 AM  Procedures .Critical Care  Performed by: Truddie Hidden, MD Authorized by: Truddie Hidden, MD   Critical care provider statement:    Critical care time (minutes):  30   Critical care time was exclusive of:  Separately billable procedures and treating other patients   Critical care was necessary to treat or prevent imminent or life-threatening deterioration of the following conditions:  Respiratory failure   Critical care was time spent personally by me on the following activities:  Development of treatment plan with patient or surrogate, discussions with consultants, evaluation of patient's response to treatment, examination of patient, ordering and review of laboratory studies, ordering and review of radiographic studies, ordering and performing treatments and interventions, pulse oximetry, re-evaluation of patient's condition and review of old charts   Care discussed with: admitting provider    Medications Ordered in the ED Medications  cefTRIAXone (ROCEPHIN) 1 g in sodium chloride 0.9 % 100 mL IVPB (1 g Intravenous New Bag/Given 03/29/21 1038)  azithromycin (ZITHROMAX) 500 mg in sodium chloride 0.9 % 250 mL IVPB (has no administration in time range)  lactated ringers bolus 1,000 mL (has no administration in time range)  lactated ringers infusion (has no administration in time range)  potassium chloride SA (KLOR-CON M) CR tablet 40 mEq (has no administration in time range)    Initial Impression  and Plan  Patient with cough, fever, diarrhea. Concern for Covid or possibly a bacterial pneumonia. She is currently on 2L [place] oxygen, resting comfortably. She has had 3 Covid vaccines, but not the most recent bivalent booster. She has not been around anyone sick as far as she knows. Will check labs per Covid order set.   ED Course   Clinical Course as of 03/29/21 1101  Fri Mar 29, 2021  0840 I personally viewed the images from radiology studies and agree with radiologist interpretation: RML infiltrate consistent with pneumonia. Will await Covid results to determine best treatment regimen. She does not currently have any signs of sepsis or shock to warrant aggressive fluid resuscitation.   [CS]  0922 CBC with mild anemia, normal WBC. [CS]  5784 Covid/Flu are neg. Will begin Rocephin/Zithromax for CAP. Awaiting further labs but anticipate admission.  [CS]  1017 CMP with mild hypokalemia, will begin repletion with LR infusion. Otherwise no significant findings.  [CS]  6962 Hospitalists paged for admission.  [CS]  1023 Spoke to patient and daughter at bedside regarding her results. Recommend admission and they are amenable. Questions answered.  [CS]  9528 Initial Lactic acid is mildly elevated, will give an initial LR bolus before infusion. No other signs of shock. BP remains adequate. Could be from hypoxia pre-arrival.  [CS]  64 Spoke with Dr. Harvest Forest, Hospitalist, who will evaluate for admission.  [CS]    Clinical Course User Index [CS] Truddie Hidden, MD     MDM Rules/Calculators/A&P Medical Decision Making Problems Addressed: Acute respiratory failure with hypoxia [facility]): acute illness or injury that poses a threat to life or bodily functions Community acquired pneumonia of right middle lobe of lung: acute illness or injury that poses a threat to life or bodily functions Hypokalemia: acute illness or injury  Amount and/or Complexity of Data Reviewed Labs: ordered. Decision-making  details documented in ED Course. Radiology: ordered and independent interpretation performed. Decision-making details documented in ED Course. ECG/medicine tests: ordered and independent interpretation performed. Decision-making details documented in ED Course.  Risk Prescription drug management. Decision regarding hospitalization.    Final Clinical Impression(s) / ED Diagnoses Final diagnoses:  Community acquired pneumonia of right middle lobe of lung  Acute respiratory failure with hypoxia ([place])  Hypokalemia    Rx / DC Orders ED Discharge Orders     None        Truddie Hidden, MD 03/29/21 1453

## 2021-03-29 NOTE — ED Notes (Signed)
Pt SpO2 90% Ra. Placed pt on 2 liters [place]. Pt endorses comfort ?

## 2021-03-29 NOTE — Assessment & Plan Note (Addendum)
Continue Crestor 

## 2021-03-29 NOTE — Assessment & Plan Note (Addendum)
Patient presents with complaints of shortness of breath and cough.  O2 saturations noted to be as low as 85% on room air placed on 4 L of oxygen with improvement to 95%.  In addition she was noted to be febrile up to 103 ?F given 1g of Tylenol prior to arrival.  Chest x-ray is concerning for right middle lobe pneumonia.  Patient has been started on Rocephin and azithromycin in the ED. ?-Admit to a medical telemetry bed ?-Continuous pulse oximetry with oxygen maintain O2 saturation greater than 92% ?-Aspiration precautions with elevation of head of bed  ?-Continue empiric antibiotics of Rocephin and azithromycin ?-Mucinex ? ?

## 2021-03-29 NOTE — Assessment & Plan Note (Addendum)
Acute.  Potassium 2.9 on admission.  Patient has been given 40 mEq potassium chloride p.o. ?-Given additional 40 mEq of potassium chloride ?-Continue to monitor and replace as needed ?

## 2021-03-29 NOTE — H&P (Signed)
History and Physical    Patient: Allison Duffy:[id number] DOB: 1926-03-17 DOA: 03/29/2021 DOS: the patient was seen and examined on 03/29/2021 PCP: Burnard Bunting, MD  Patient coming from: Home via EMS  Chief Complaint:  Chief Complaint  Patient presents with   Fever    HPI: Allison Duffy is a 88 y.o. female with medical history significant of hypertension, hyperlipidemia, chronic diastolic heart failure (last EF noted to be 60-65%), atrial fibrillation on chronic anticoagulation who presents with complaints of fever and malaise over the last 2 days.  Normally patient is getting around with use of a cane and is not on oxygen at baseline.  Her daughter is present at bedside and adds that the patient had complained of feeling dizzy 2 days ago.  Patient had recently had a bout of her "colitis" is which daughter describes as huge bouts of diarrhea for which she was placed on cholestyramine by her primary Dr. Reynaldo Minium.  She was feeling so weak that she had not taken the medication.  Daughter noted that she had been using her walker to try and get around.  In addition to this patient complained of some shortness of breath, mildly productive cough, chills, and some chest discomfort.  She had not recently been on antibiotics or previously ever been diagnosed with C. difficile.  Denies any blood in stools, abdominal pain, or episodes of vomiting.  Upon EMS arrival patient's O2 saturations were noted to be 85% on room air placed on 4 L of oxygen with improvement to 95%.  Patient was noted to be febrile up to 103 F and given Tylenol 1 g prior to arrival.  In the ED patient was noted to have temperature of 99.8 F with blood pressures elevated up to 180/49, and O2 saturation currently maintained on 3-4 L nasal cannula oxygen.  Chest x-ray noted concern for right middle lobe pneumonia with minimal left basilar airspace disease concerning for atelectasis.  Patient has been given 1 L lactated Ringer's,  Rocephin, azithromycin, and 40 mEq of potassium chloride p.o.   Review of Systems: As mentioned in the history of present illness. All other systems reviewed and are negative. Past Medical History:  Diagnosis Date   Acute on chronic combined systolic and diastolic CHF (congestive heart failure) ([place]) 01/28/2019   Arthritis    "scattered joints" (07/29/2013)   Chest pain    Colitis    Gout attack ?1980's   Hyperlipidemia    Hypertension    PONV (postoperative nausea and vomiting)    Type II diabetes mellitus ([place])    Past Surgical History:  Procedure Laterality Date   APPENDECTOMY     CARPAL TUNNEL RELEASE Right    CATARACT EXTRACTION W/ INTRAOCULAR LENS  IMPLANT, BILATERAL Bilateral    CHOLECYSTECTOMY     KNEE ARTHROSCOPY Right    LEFT HEART CATH AND CORONARY ANGIOGRAPHY N/A 04/12/2020   Procedure: LEFT HEART CATH AND CORONARY ANGIOGRAPHY;  Surgeon: Lorretta Harp, MD;  Location: [facility];  Service: Cardiovascular;  Laterality: N/A;   OLECRANON BURSECTOMY Left 07/29/2013   "in ER"   PARATHYROIDECTOMY     TONSILLECTOMY     TOTAL SHOULDER REPLACEMENT  2017   VAGINAL HYSTERECTOMY     Social History:  reports that she quit smoking about 67 years ago. Her smoking use included cigarettes. She has never used smokeless tobacco. She reports that she does not drink alcohol and does not use drugs.  Allergies  Allergen Reactions  Codeine Nausea And Vomiting   Hydrocodone     Nausea and vomiting   Prednisone     Other reaction(s): sick on her stomach   Sulfa Antibiotics Itching, Nausea Only and Other (See Comments)    Family History  Problem Relation Age of Onset   Hypertension Father    Heart disease Father    Heart disease Mother    Diabetes Mother    Hypertension Mother    Heart disease Brother        both brothers   Diabetes Brother    Hypertension Brother     Prior to Admission medications   Medication Sig Start Date End Date Taking? Authorizing Provider   amLODipine (NORVASC) 10 MG tablet Take 10 mg by mouth at bedtime.   Yes [provider]  aspirin EC 81 MG tablet Take 1 tablet (81 mg total) by mouth daily. Swallow whole. 04/13/20 04/13/21 Yes Leonie Man, MD  carboxymethylcellulose 1 % ophthalmic solution Place 1 drop into both eyes 2 (two) times daily.   Yes [provider]  carvedilol (COREG) 12.5 MG tablet TAKE 1 TABLET (12.5 MG TOTAL) BY MOUTH TWO TIMES DAILY WITH A MEAL. Patient taking differently: Take 12.5 mg by mouth 2 (two) times daily with a meal. 04/13/20 04/13/21 Yes Leonie Man, MD  cholecalciferol (VITAMIN D) 1000 UNITS tablet Take 1,000 Units by mouth daily with lunch.   Yes [provider]  cholestyramine (QUESTRAN) 4 g packet Take 4 g by mouth daily as needed (IBS).   Yes [provider]  cloNIDine (CATAPRES) 0.1 MG tablet TAKE 2 TABLETS BY MOUTH EVERY MORNING AND 1 AT BEDTIME Patient taking differently: Take 0.1-0.2 mg by mouth See admin instructions. Take 1 tablet 0.1 mg in the AM and 2 tabs 0.2 mg with evening meal. 08/28/20  Yes Cleaver, Jossie Ng, NP  diphenhydrAMINE-Acetaminophen (CORICIDIN NIGHTTIME COLD-COUGH PO) Take 1 tablet by mouth every 6 (six) hours as needed (cough/ cold symptoms).   Yes [provider]  ELIQUIS 5 MG TABS tablet TAKE 1 TABLET BY MOUTH TWICE DAILY Patient taking differently: Take 5 mg by mouth 2 (two) times daily. 02/22/21  Yes Cleaver, Jossie Ng, NP  furosemide (LASIX) 40 MG tablet Take 1 tablet (40 mg total) by mouth daily. Take 1 additional tablet daily if increased swelling, shortness of breath, or weight gain 5 lbs or more in 2 days Patient taking differently: Take 40 mg by mouth daily. 01/29/19 03/29/21 Yes Alexander, Natalie, DO  GLIPIZIDE XL 10 MG 24 hr tablet Take 10 mg by mouth every morning. 01/24/21  Yes [provider]  metFORMIN (GLUCOPHAGE) 500 MG tablet Take 1 tablet (500 mg total) by mouth 2 (two) times daily with a meal. 04/15/20  Yes  Leonie Man, MD  Multiple Vitamin (MULTIVITAMIN WITH MINERALS) TABS Take 1 tablet by mouth daily with supper.   Yes [provider]  nitroGLYCERIN (NITROSTAT) 0.4 MG SL tablet PLACE 1 TABLET (0.4 MG TOTAL) UNDER THE TONGUE EVERY FIVE MINUTES X 3 DOSES AS NEEDED FOR CHEST PAIN. Patient taking differently: Place 0.4 mg under the tongue every 5 (five) minutes as needed for chest pain. 04/13/20 04/13/21 Yes Leonie Man, MD  olmesartan (BENICAR) 40 MG tablet Take 40 mg by mouth at bedtime.   Yes Burnard Bunting, MD  omeprazole (PRILOSEC) 40 MG capsule TAKE 1 CAPSULE (40 MG TOTAL) BY MOUTH DAILY WITH SUPPER. Patient taking differently: 40 mg daily. 04/13/20 04/13/21 Yes Leonie Man, MD  potassium chloride SA (KLOR-CON) 20 MEQ tablet Take 1 tablet (20 mEq total) by mouth daily. 04/13/20  Yes Leonie Man, MD  Probiotic Product (PRO-BIOTIC BLEND) CAPS Take 1 capsule by mouth at bedtime. Takes OTC probiotic   Yes Burnard Bunting, MD  ranolazine (RANEXA) 500 MG 12 hr tablet TAKE 1 TABLET BY MOUTH TWICE DAILY Patient taking differently: Take 500 mg by mouth 2 (two) times daily. 01/24/21  Yes Lorretta Harp, MD  rosuvastatin (CRESTOR) 20 MG tablet TAKE 1 TABLET BY MOUTH EVERY NIGHT AT BEDTIME Patient taking differently: Take 20 mg by mouth at bedtime. 01/24/21  Yes Lorretta Harp, MD  sitaGLIPtin (JANUVIA) 100 MG tablet Take 100 mg by mouth daily with lunch.   Yes [provider]  Turmeric (QC TUMERIC COMPLEX PO) Take 500 mg by mouth daily as needed (allergies).   Yes Burnard Bunting, MD  Lancets [facility] ULTRASOFT) lancets use to check blood sugar twice a day as directed 12/18/11   [provider]  [facility] ULTRA test strip USE AS DIRECTED TO TEST BLOOD SUGAR TWICE DAILY 06/29/19   [provider]    Physical Exam: Vitals:   03/29/21 0825 03/29/21 0908 03/29/21 1000 03/29/21 1030  BP:  (!) 151/71 (!) 166/55 (!) 164/51  Pulse:  (!) 55 60 (!) 58   Resp:  17 20 16   Temp:      TempSrc:      SpO2:  94% 95% 97%  Weight: 74.8 kg     Height: 5\' 5"  (1.651 m)      Constitutional: Elderly female who appears to be in some distress as she just had a bowel movement Eyes: PERRL, lids and conjunctivae normal ENMT: Mucous membranes are moist. Posterior pharynx clear of any exudate or lesions.  Neck: normal, supple, no masses, no thyromegaly.  No JVD. Respiratory: Normal respiratory effort with some rales noted on the right mid lung field.  O2 saturations currently maintained on 4 L of oxygen. Cardiovascular: Regular rate and rhythm with 2/6 positive systolic murmur appreciated.  No significant lower extremity edema. Abdomen: no tenderness, bowel sounds present. Musculoskeletal: no clubbing / cyanosis. No joint deformity upper and lower extremities.  Skin: no rashes, lesions, ulcers. No induration Neurologic: CN 2-12 grossly intact.  Strength 4+/5 all extremities.   Psychiatric: Normal judgment and insight. Alert and oriented x 3. Anxious mood.    Data Reviewed:  EKG sinus rhythm at 60 bpm with first-degree heart block and premature atrial complexes.  Assessment and Plan: Acute respiratory failure with hypoxia secondary to community acquired pneumonia Patient presents with complaints of shortness of breath and cough.  O2 saturations noted to be as low as 85% on room air placed on 4 L of oxygen with improvement to 95%.  In addition she was noted to be febrile up to 48 F given 1g of Tylenol prior to arrival.  Chest x-ray is concerning for right middle lobe pneumonia.  Patient has been started on Rocephin and azithromycin in the ED. -Admit to a medical telemetry bed -Continuous pulse oximetry with oxygen maintain O2 saturation greater than 92% -Aspiration precautions with elevation of head of bed  -Continue empiric antibiotics of Rocephin and azithromycin -Mucinex   Hypokalemia Acute.  Potassium 2.9 on admission.  Patient has been given 40  mEq potassium chloride p.o. -Given additional 40 mEq of potassium chloride -Continue to monitor and replace as needed  Diarrhea Patient reports recent bout of "colitis" where she has been having large bouts of  diarrhea.  No prior history of C. Difficile or recent antibiotics given.  Reports being on cholestyramine for treatment and takes a daily probiotic. -Continue probiotic and cholestyramine  Chest pain  CAD Acute.  Patient did report having some complaints of chest pain, but at the time of the evaluation was not having ongoing symptoms.  Patient's D-dimer which is mildly elevated 0.53.  Lower suspicion for pulmonary embolus at this time as she is on anticoagulation.Patient with prior history of left heart cath in 03/2020 which noted proximal LAD to mid LAD lesion of 50% stenosis, second diagonal lesion 75% stenosis, ramus lesion 50% stenosed for which no culprit lesion was identified, and medical management was recommended. -Check troponin -Continue aspirin and statin  Type 2 diabetes mellitus with complication, without long-term current use of insulin ([place]) On admission glucose elevated up to 283.  Last available hemoglobin A1c was 6.3 04/11/2020.  Medication regimen includes metformin 500 mg twice daily with meals, glipizide 10 mg daily, and Januvia 100 mg q. lunch. -Hypoglycemic protocols -Add-on hemoglobin A1c -Hold home insulin regimen -CBGs before every meal with moderate SSI  Lactic acidosis Acute.  Lactic acid elevated at 3.1 on admission.  Suspect possibly related to infection and/or dehydration from diarrhea. -Continue to trend lactic acid level  Hyperlipidemia associated with type 2 diabetes mellitus ([place]) - Continue Crestor  Essential hypertension On admission blood pressures elevated up to 180/49.  Home blood pressure regimen includes amlodipine 10 mg nightly, Coreg 12.5 mg twice daily, clonidine 0.1-0.2 mg twice daily, olmesartan 40 mg nightly, and Ranexa 500 twice daily  mg. -Continue Coreg, amlodipine, clonidine, Ranexa, and pharmacy substitution of irbesartan for olmesartan   Paroxysmal atrial fibrillation ([place]) Patient with prior history of atrial fibrillation/flutter appears to be currently in sinus rhythm.   Chronic diastolic CHF (congestive heart failure) ([place]) Patient appears to be compensated at this time.  Last EF noted to be 60 to 65% by echocardiogram in 03/2020. -Monitor intake and output and daily weight -Held furosemide due to diarrhea reports with low sodium -Reassess and determine when medically appropriate to resume furosemide p.o.      Note patient should not receive any iron supplements as it makes her very sick. Advance Care Planning:   Code Status: Full Code. Verified with daughter present at bedside.  Consults: None  Family Communication: Daughter updated at bedside  Severity of Illness: The appropriate patient status for this patient is INPATIENT. Inpatient status is judged to be reasonable and necessary in order to provide the required intensity of service to ensure the patient's safety. The patient's presenting symptoms, physical exam findings, and initial radiographic and laboratory data in the context of their chronic comorbidities is felt to place them at high risk for further clinical deterioration. Furthermore, it is not anticipated that the patient will be medically stable for discharge from the hospital within 2 midnights of admission.   * I certify that at the point of admission it is my clinical judgment that the patient will require inpatient hospital care spanning beyond 2 midnights from the point of admission due to high intensity of service, high risk for further deterioration and high frequency of surveillance required.*  Author: Norval Morton, MD 03/29/2021 11:00 AM  For on call review [email or web address].

## 2021-03-29 NOTE — Assessment & Plan Note (Addendum)
On admission glucose elevated up to 283.  Last available hemoglobin A1c was 6.3 04/11/2020.  Medication regimen includes metformin 500 mg twice daily with meals, glipizide 10 mg daily, and Januvia 100 mg q. lunch. ?-Hypoglycemic protocols ?-Add-on hemoglobin A1c ?-Hold home insulin regimen ?-CBGs before every meal with moderate SSI ?

## 2021-03-29 NOTE — ED Triage Notes (Signed)
Pt here via EMS from home d/t fever, Nausea and diarhrrea X2 days. Upon EMS arrival pt SpO2 85% RA, placed pt on 4 liters with increase to 95%. Litle productive cough. Febrile for EMS at 103.0. gave 1g tylenol.  ?

## 2021-03-30 ENCOUNTER — Inpatient Hospital Stay (HOSPITAL_COMMUNITY): Payer: Medicare Other

## 2021-03-30 DIAGNOSIS — I5031 Acute diastolic (congestive) heart failure: Secondary | ICD-10-CM

## 2021-03-30 DIAGNOSIS — J189 Pneumonia, unspecified organism: Principal | ICD-10-CM

## 2021-03-30 LAB — RESPIRATORY PANEL BY PCR

## 2021-03-30 LAB — CBC
HCT: 29.6 % — ABNORMAL LOW (ref 36.0–46.0)
Hemoglobin: 10.5 g/dL — ABNORMAL LOW (ref 12.0–15.0)
MCH: 34.2 pg — ABNORMAL HIGH (ref 26.0–34.0)
MCHC: 35.5 g/dL (ref 30.0–36.0)
MCV: 96.4 fL (ref 80.0–100.0)
Platelets: 144 10*3/uL — ABNORMAL LOW (ref 150–400)
RBC: 3.07 MIL/uL — ABNORMAL LOW (ref 3.87–5.11)
RDW: 11.8 % (ref 11.5–15.5)
WBC: 6.1 10*3/uL (ref 4.0–10.5)
nRBC: 0 % (ref 0.0–0.2)

## 2021-03-30 LAB — ECHOCARDIOGRAM COMPLETE
AR max vel: 1.74 cm2
AV Area VTI: 1.61 cm2
AV Area mean vel: 1.61 cm2
AV Mean grad: 6 mmHg
AV Peak grad: 10 mmHg
Ao pk vel: 1.58 m/s
Area-P 1/2: 3.76 cm2
Height: 65 in
MV M vel: 5.61 m/s
MV Peak grad: 125.9 mmHg
Radius: 0.5 cm
S' Lateral: 3.8 cm
Weight: 2640 oz

## 2021-03-30 LAB — EXPECTORATED SPUTUM ASSESSMENT W GRAM STAIN, RFLX TO RESP C

## 2021-03-30 LAB — GLUCOSE, CAPILLARY
Glucose-Capillary: 163 mg/dL — ABNORMAL HIGH (ref 70–99)
Glucose-Capillary: 225 mg/dL — ABNORMAL HIGH (ref 70–99)
Glucose-Capillary: 162 mg/dL — ABNORMAL HIGH (ref 70–99)
Glucose-Capillary: 266 mg/dL — ABNORMAL HIGH (ref 70–99)

## 2021-03-30 LAB — BASIC METABOLIC PANEL
Anion gap: 9 (ref 5–15)
BUN: 16 mg/dL (ref 8–23)
CO2: 21 mmol/L — ABNORMAL LOW (ref 22–32)
Calcium: 7.9 mg/dL — ABNORMAL LOW (ref 8.9–10.3)
Chloride: 102 mmol/L (ref 98–111)
Creatinine, Ser: 1.09 mg/dL — ABNORMAL HIGH (ref 0.44–1.00)
GFR, Estimated: 48 mL/min — ABNORMAL LOW (ref >60)
Glucose, Bld: 205 mg/dL — ABNORMAL HIGH (ref 70–99)
Potassium: 3.5 mmol/L (ref 3.5–5.1)
Sodium: 132 mmol/L — ABNORMAL LOW (ref 135–145)

## 2021-03-30 LAB — MAGNESIUM: Magnesium: 1.7 mg/dL (ref 1.7–2.4)

## 2021-03-30 LAB — TROPONIN I (HIGH SENSITIVITY)
Troponin I (High Sensitivity): 74 ng/L — ABNORMAL HIGH (ref <18)
Troponin I (High Sensitivity): 76 ng/L — ABNORMAL HIGH (ref <18)

## 2021-03-30 LAB — LACTIC ACID, PLASMA
Lactic Acid, Venous: 1.6 mmol/L (ref 0.5–1.9)
Lactic Acid, Venous: 1.1 mmol/L (ref 0.5–1.9)

## 2021-03-30 LAB — BRAIN NATRIURETIC PEPTIDE: B Natriuretic Peptide: 385.4 pg/mL — ABNORMAL HIGH (ref 0.0–100.0)

## 2021-03-30 LAB — PHOSPHORUS: Phosphorus: 2.9 mg/dL (ref 2.5–4.6)

## 2021-03-30 LAB — STREP PNEUMONIAE URINARY ANTIGEN: Strep Pneumo Urinary Antigen: NEGATIVE

## 2021-03-30 MED ORDER — FUROSEMIDE 10 MG/ML IJ SOLN
40.0000 mg | Freq: Once | INTRAMUSCULAR | Status: AC
  Administered 2021-03-30: 40 mg via INTRAVENOUS

## 2021-03-30 MED ORDER — FUROSEMIDE 10 MG/ML IJ SOLN
INTRAMUSCULAR | Status: AC
  Filled 2021-03-30: qty 2

## 2021-03-30 MED ORDER — SODIUM CHLORIDE 0.9 % IV SOLN
500.0000 mg | INTRAVENOUS | Status: DC
  Administered 2021-03-30 – 2021-03-31 (×2): 500 mg via INTRAVENOUS
  Filled 2021-03-30 (×2): qty 5

## 2021-03-30 MED ORDER — AZITHROMYCIN 500 MG PO TABS: 500.0000 mg | ORAL_TABLET | Freq: Every day | ORAL | Status: DC

## 2021-03-30 MED ORDER — FUROSEMIDE 10 MG/ML IJ SOLN
40.0000 mg | Freq: Two times a day (BID) | INTRAMUSCULAR | Status: DC
Start: 2021-03-30 — End: 2021-04-03
  Administered 2021-03-30 – 2021-04-02 (×8): 40 mg via INTRAVENOUS
  Filled 2021-03-30 (×8): qty 4

## 2021-03-30 MED ORDER — POTASSIUM CHLORIDE CRYS ER 20 MEQ PO TBCR
40.0000 meq | EXTENDED_RELEASE_TABLET | Freq: Every day | ORAL | Status: DC
  Administered 2021-03-30 – 2021-04-03 (×5): 40 meq via ORAL
  Filled 2021-03-30 (×5): qty 2

## 2021-03-30 MED ORDER — SODIUM CHLORIDE 0.9 % IV SOLN
12.5000 mg | Freq: Four times a day (QID) | INTRAVENOUS | Status: DC | PRN
  Filled 2021-03-30 (×2): qty 0.5

## 2021-03-30 MED ORDER — FUROSEMIDE 10 MG/ML IJ SOLN
INTRAMUSCULAR | Status: AC
  Administered 2021-03-30: 20 mg
  Filled 2021-03-30: qty 4

## 2021-03-30 NOTE — Progress Notes (Addendum)
Date and time results received: 03/30/21 1113 ? ?Test: Troponin ?Critical Value: 74 ? ?Name of Provider Notified: Dr. Doristine Bosworth  ? ?Orders Received? Or Actions Taken?:  MD made aware ?

## 2021-03-30 NOTE — Progress Notes (Signed)
PROGRESS NOTE    Allison Duffy  WUJ:[id number] DOB: 02-10-1926 DOA: 03/29/2021 PCP: Burnard Bunting, MD   Brief Narrative:  HPI: Allison Duffy is a 88 y.o. female with medical history significant of hypertension, hyperlipidemia, chronic diastolic heart failure (last EF noted to be 60-65%), atrial fibrillation on chronic anticoagulation who presents with complaints of fever and malaise over the last 2 days.  Normally patient is getting around with use of a cane and is not on oxygen at baseline.  Her daughter is present at bedside and adds that the patient had complained of feeling dizzy 2 days ago.  Patient had recently had a bout of her "colitis" is which daughter describes as huge bouts of diarrhea for which she was placed on cholestyramine by her primary Dr. Reynaldo Duffy.  She was feeling so weak that she had not taken the medication.  Daughter noted that she had been using her walker to try and get around.  In addition to this patient complained of some shortness of breath, mildly productive cough, chills, and some chest discomfort.  She had not recently been on antibiotics or previously ever been diagnosed with C. difficile.  Denies any blood in stools, abdominal pain, or episodes of vomiting.   Upon EMS arrival patient's O2 saturations were noted to be 85% on room air placed on 4 L of oxygen with improvement to 95%.  Patient was noted to be febrile up to 103 F and given Tylenol 1 g prior to arrival.   In the ED patient was noted to have temperature of 99.8 F with blood pressures elevated up to 180/49, and O2 saturation currently maintained on 3-4 L nasal cannula oxygen.  Chest x-ray noted concern for right middle lobe pneumonia with minimal left basilar airspace disease concerning for atelectasis.  Patient has been given 1 L lactated Ringer's, Rocephin, azithromycin, and 40 mEq of potassium chloride p.o.  Assessment & Plan:   Active Problems:   Coronary artery disease   Acute respiratory  failure with hypoxia secondary to community acquired pneumonia   Type 2 diabetes mellitus with complication, without long-term current use of insulin (HCC)   Chest pain  CAD   Diarrhea   Hypokalemia   Essential hypertension   Hyperlipidemia associated with type 2 diabetes mellitus (HCC)   Lactic acidosis   Chronic diastolic CHF (congestive heart failure) (HCC)   Paroxysmal atrial fibrillation (HCC)   Acute respiratory failure with hypoxia (HCC)  Acute respiratory failure with hypoxia secondary to community acquired pneumonia as well as acute on chronic diastolic CHF: Patient was initially admitted with only pneumonia.  She was started on IV antibiotics and fluids.  Overnight her respiratory distress got worse and she required much higher amount of oxygen.  Repeat chest x-ray showed vascular congestion.  However on my personal review, even the first chest x-ray shows vascular congestion but this was not revealed by official radiology report.  IV fluids have been discontinued overnight.  She received 50 mg of Lasix so far.  Still on 15 L of high flow oxygen.  Still with shortness of breath.  Has very minimal bibasilar crackles.  Procalcitonin unremarkable, but she has fever suggesting some sort of infection.  I wonder if this is more viral than bacterial pneumonia.  We will check respiratory viral panel, sputum culture, urine antigen for Legionella and streptococci and will continue empiric IV antibiotics for possible bacterial pneumonia.  We will also start her on scheduled Lasix IV 40 mg twice daily with  a strict I's and O's.  Last echo done about a year ago with normal ejection fraction.  We will repeat echo.  Also check cardiac enzymes to rule out ACS.  Severe sepsis, not POA: Patient did not meet severe sepsis criteria upon admission however after admission, she developed fever as high as 102.7 and she was tachypneic.  No leukocytosis.  Lactic acid 3.2 along with severe hypoxia with source of  infection likely pneumonia, she meets criteria for severe sepsis.  Prior history of CAD now with chest pain: Patient had complaint of chest pain.  Slight elevated troponin x1 so far.  No further orders.  atient with prior history of left heart cath in 03/2020 which noted proximal LAD to mid LAD lesion of 50% stenosis, second diagonal lesion 75% stenosis, ramus lesion 50% stenosed for which no culprit lesion was identified, and medical management was recommended.. We will check troponins and echo is ordered as well.  Hypokalemia: Resolved.  Now that she is going to be on scheduled IV Lasix, I will place her on a scheduled p.o. KCl daily.  Diarrhea Patient reports recent bout of "colitis" where she has been having large bouts of diarrhea.  No prior history of C. Difficile or recent antibiotics given.  Reports being on cholestyramine for treatment and takes a daily probiotic. -Continue probiotic and cholestyramine   Type 2 diabetes mellitus with complication, without long-term current use of insulin ([place]) On admission glucose elevated up to 283.  Last available hemoglobin A1c was 6.3 04/11/2020.  Medication regimen includes metformin 500 mg twice daily with meals, glipizide 10 mg daily, and Januvia 100 mg q. lunch.  Hemoglobin A1c 5.9.  Continue SSI.   Lactic acidosis: Secondary to infection.  We will repeat.   Hyperlipidemia associated with type 2 diabetes mellitus ([place]) - Continue Crestor   Essential hypertension On admission blood pressures elevated up to 180/49.  Home blood pressure regimen includes amlodipine 10 mg nightly, Coreg 12.5 mg twice daily, clonidine 0.1-0.2 mg twice daily, olmesartan 40 mg nightly, and Ranexa 500 twice daily mg.  Continue Coreg, amlodipine, clonidine, Ranexa, and pharmacy substitution of irbesartan for olmesartan.  Blood pressure controlled.   Paroxysmal atrial fibrillation ([place]): Rates controlled.  Continue Coreg and Eliquis.   DVT prophylaxis:   Eliquis   Code  Status: Full Code, I confirmed once again with the daughter and the patient. Family Communication: Daughter present at bedside.  Plan of care discussed with patient in length and he/she verbalized understanding and agreed with it.  Status is: Inpatient Remains inpatient appropriate because: Patient very sick needs inpatient management.  Estimated body mass index is 27.46 kg/m as calculated from the following:   Height as of this encounter: '5\' 5"'$  (1.651 m).   Weight as of this encounter: 74.8 kg.    Nutritional Assessment: Body mass index is 27.46 kg/m.Marland Kitchen Seen by dietician.  I agree with the assessment and plan as outlined below: Nutrition Status:        . Skin Assessment: I have examined the patient's skin and I agree with the wound assessment as performed by the wound care RN as outlined below:    Consultants:  None  Procedures:  None  Antimicrobials:  Anti-infectives (From admission, onward)    Start     Dose/Rate Route Frequency Ordered Stop   03/30/21 1130  azithromycin (ZITHROMAX) tablet 500 mg  Status:  Discontinued        500 mg Oral Daily 03/30/21 1042 03/30/21 1044  03/30/21 1130  azithromycin (ZITHROMAX) 500 mg in sodium chloride 0.9 % 250 mL IVPB        500 mg 250 mL/hr over 60 Minutes Intravenous Every 24 hours 03/30/21 1044 04/03/21 1129   03/30/21 1000  cefTRIAXone (ROCEPHIN) 2 g in sodium chloride 0.9 % 100 mL IVPB        2 g 200 mL/hr over 30 Minutes Intravenous Every 24 hours 03/29/21 1249 04/03/21 0959   03/30/21 1000  azithromycin (ZITHROMAX) 500 mg in sodium chloride 0.9 % 250 mL IVPB  Status:  Discontinued        500 mg 250 mL/hr over 60 Minutes Intravenous Every 24 hours 03/29/21 1249 03/30/21 1042   03/29/21 1000  cefTRIAXone (ROCEPHIN) 1 g in sodium chloride 0.9 % 100 mL IVPB        1 g 200 mL/hr over 30 Minutes Intravenous  Once 03/29/21 0953 03/29/21 1107   03/29/21 1000  azithromycin (ZITHROMAX) 500 mg in sodium chloride 0.9 % 250 mL IVPB         500 mg 250 mL/hr over 60 Minutes Intravenous  Once 03/29/21 0953 03/29/21 1227         Subjective: Patient seen and examined.  Daughter at the bedside.  Patient still complains of shortness of breath and having hard time speaking in full sentences.  Objective: Vitals:   03/30/21 0832 03/30/21 0915 03/30/21 0920 03/30/21 0957  BP: (!) 164/80     Pulse: 61   (!) 59  Resp: 16   18  Temp: (!) 101.5 F (38.6 C)     TempSrc: Oral     SpO2: 96% (!) 88% 92% 93%  Weight:      Height:        Intake/Output Summary (Last 24 hours) at 03/30/2021 1110 Last data filed at 03/30/2021 0900 Gross per 24 hour  Intake 2688.12 ml  Output 300 ml  Net 2388.12 ml   Filed Weights   03/29/21 0825  Weight: 74.8 kg    Examination:  General exam: Appears in mild respiratory distress Respiratory system: Bibasilar crackles. Respiratory effort normal. Cardiovascular system: S1 & S2 heard, RRR. No JVD, murmurs, rubs, gallops or clicks. No pedal edema. Gastrointestinal system: Abdomen is nondistended, soft and nontender. No organomegaly or masses felt. Normal bowel sounds heard. Central nervous system: Alert and oriented. No focal neurological deficits. Extremities: Symmetric 5 x 5 power. Skin: No rashes, lesions or ulcers Psychiatry: Judgement and insight appear normal. Mood & affect appropriate.    Data Reviewed: I have personally reviewed following labs and imaging studies  CBC: Recent Labs  Lab 03/29/21 0846 03/30/21 0146  WBC 7.1 6.1  NEUTROABS 5.4  --   HGB 11.8* 10.5*  HCT 33.2* 29.6*  MCV 96.2 96.4  PLT 174 662*   Basic Metabolic Panel: Recent Labs  Lab 03/29/21 0846 03/30/21 0146  NA 133* 132*  K 2.9* 3.5  CL 100 102  CO2 22 21*  GLUCOSE 283* 205*  BUN 15 16  CREATININE 1.00 1.09*  CALCIUM 8.4* 7.9*  MG  --  1.7  PHOS  --  2.9   GFR: Estimated Creatinine Clearance: 33.3 mL/min (A) (by C-G formula based on SCr of 1.09 mg/dL (H)). Liver Function Tests: Recent  Labs  Lab 03/29/21 0846  AST 26  ALT 21  ALKPHOS 37*  BILITOT 0.7  PROT 6.3*  ALBUMIN 3.2*   No results for input(s): LIPASE, AMYLASE in the last 168 hours. No results for input(s): AMMONIA  in the last 168 hours. Coagulation Profile: No results for input(s): INR, PROTIME in the last 168 hours. Cardiac Enzymes: No results for input(s): CKTOTAL, CKMB, CKMBINDEX, TROPONINI in the last 168 hours. BNP (last 3 results) No results for input(s): PROBNP in the last 8760 hours. HbA1C: Recent Labs    03/29/21 1916  HGBA1C 5.9*   CBG: Recent Labs  Lab 03/29/21 1731 03/29/21 2113 03/30/21 0714  GLUCAP 219* 133* 163*   Lipid Profile: Recent Labs    03/29/21 0846  TRIG 87   Thyroid Function Tests: No results for input(s): TSH, T4TOTAL, FREET4, T3FREE, THYROIDAB in the last 72 hours. Anemia Panel: Recent Labs    03/29/21 0846  FERRITIN 428*   Sepsis Labs: Recent Labs  Lab 03/29/21 0846 03/29/21 1227  PROCALCITON <0.10  --   LATICACIDVEN 3.1* 3.2*    Recent Results (from the past 240 hour(s))  Resp Panel by RT-PCR (Flu A&B, Covid) Nasopharyngeal Swab     Status: None   Collection Time: 03/29/21  8:41 AM   Specimen: Nasopharyngeal Swab; Nasopharyngeal(NP) swabs in vial transport medium  Result Value Ref Range Status   SARS Coronavirus 2 by RT PCR NEGATIVE NEGATIVE Final    Comment: (NOTE) SARS-CoV-2 target nucleic acids are NOT DETECTED.  The SARS-CoV-2 RNA is generally detectable in upper respiratory specimens during the acute phase of infection. The lowest concentration of SARS-CoV-2 viral copies this assay can detect is 138 copies/mL. A negative result does not preclude SARS-Cov-2 infection and should not be used as the sole basis for treatment or other patient management decisions. A negative result may occur with  improper specimen collection/handling, submission of specimen other than nasopharyngeal swab, presence of viral mutation(s) within the areas  targeted by this assay, and inadequate number of viral copies(<138 copies/mL). A negative result must be combined with clinical observations, patient history, and epidemiological information. The expected result is Negative.  Fact Sheet for Patients:  EntrepreneurPulse.com.au  Fact Sheet for Healthcare Providers:  IncredibleEmployment.be  This test is no t yet approved or cleared by the [place] FDA and  has been authorized for detection and/or diagnosis of SARS-CoV-2 by FDA under an Emergency Use Authorization (EUA). This EUA will remain  in effect (meaning this test can be used) for the duration of the COVID-19 declaration under Section 564(b)(1) of the Act, 21 U.S.C.section 360bbb-3(b)(1), unless the authorization is terminated  or revoked sooner.       Influenza A by PCR NEGATIVE NEGATIVE Final   Influenza B by PCR NEGATIVE NEGATIVE Final    Comment: (NOTE) The Xpert Xpress SARS-CoV-2/FLU/RSV plus assay is intended as an aid in the diagnosis of influenza from Nasopharyngeal swab specimens and should not be used as a sole basis for treatment. Nasal washings and aspirates are unacceptable for Xpert Xpress SARS-CoV-2/FLU/RSV testing.  Fact Sheet for Patients: EntrepreneurPulse.com.au  Fact Sheet for Healthcare Providers: IncredibleEmployment.be  This test is not yet approved or cleared by the [place] FDA and has been authorized for detection and/or diagnosis of SARS-CoV-2 by FDA under an Emergency Use Authorization (EUA). This EUA will remain in effect (meaning this test can be used) for the duration of the COVID-19 declaration under Section 564(b)(1) of the Act, 21 U.S.C. section 360bbb-3(b)(1), unless the authorization is terminated or revoked.  Performed at [facility], [place] [address], [place], [place] [address]   Blood Culture (routine x 2)     Status: None (Preliminary result)    Collection Time: 03/29/21  8:50 AM   Specimen: BLOOD  Result Value Ref Range Status   Specimen Description BLOOD SITE NOT SPECIFIED  Final   Special Requests   Final    BOTTLES DRAWN AEROBIC AND ANAEROBIC Blood Culture results may not be optimal due to an excessive volume of blood received in culture bottles   Culture   Final    NO GROWTH 1 DAY Performed at [facility] [address], [place], [place] [address]    Report Status PENDING  Incomplete  Blood Culture (routine x 2)     Status: None (Preliminary result)   Collection Time: 03/29/21  8:55 AM   Specimen: BLOOD  Result Value Ref Range Status   Specimen Description BLOOD SITE NOT SPECIFIED  Final   Special Requests   Final    BOTTLES DRAWN AEROBIC AND ANAEROBIC Blood Culture results may not be optimal due to an excessive volume of blood received in culture bottles   Culture   Final    NO GROWTH 1 DAY Performed at [facility], [place] [address] [place], [place] [address]    Report Status PENDING  Incomplete     Radiology Studies: DG Chest Port 1 View  Result Date: 03/30/2021 CLINICAL DATA:  Acute respiratory distress. EXAM: PORTABLE CHEST 1 VIEW COMPARISON:  03/29/2021. FINDINGS: The heart size and mediastinal contours are stable. The pulmonary vasculature is distended. There is atherosclerotic calcification of the aorta. Interstitial prominence with patchy airspace disease is noted in the lungs bilaterally and increased from the prior exam. No effusion or pneumothorax. Right shoulder arthroplasty changes. Stable surgical clips are seen at the thoracic inlet on the left. IMPRESSION: 1. Cardiomegaly with pulmonary vascular congestion. 2. Increased interstitial and airspace opacities in the lungs bilaterally, possible edema or infiltrate. Electronically Signed   By: Brett Fairy M.D.   On: 03/30/2021 02:07   DG Chest Port 1 View  Result Date: 03/29/2021 CLINICAL DATA:  Cough and fever.  Hypoxia. EXAM: PORTABLE CHEST 1  VIEW COMPARISON:  Two-view chest x-ray 11/18/2020 FINDINGS: Heart size is normal. Atherosclerotic changes are again noted at the aortic arch. Changes of COPD noted. New right middle lobe airspace disease is present. Minimal airspace opacity is noted at the left base. Right shoulder arthroplasty noted. Advanced degenerative changes present at the left shoulder. IMPRESSION: 1. New right middle lobe airspace disease compatible with pneumonia. 2. Minimal left basilar airspace disease likely reflects atelectasis. 3. Changes of COPD. 4. Aortic atherosclerosis. Electronically Signed   By: San Morelle M.D.   On: 03/29/2021 08:36    Scheduled Meds:  acidophilus  1 capsule Oral QHS   amLODipine  10 mg Oral QHS   apixaban  5 mg Oral BID   aspirin EC  81 mg Oral Daily   carvedilol  12.5 mg Oral BID WC   cloNIDine  0.2 mg Oral Daily   And   cloNIDine  0.1 mg Oral QHS   furosemide       furosemide  40 mg Intravenous BID   guaiFENesin  600 mg Oral BID   insulin aspart  0-15 Units Subcutaneous TID WC   irbesartan  300 mg Oral Daily   pantoprazole  40 mg Oral Daily   polyvinyl alcohol  1 drop Both Eyes BID   potassium chloride  40 mEq Oral Daily   ranolazine  500 mg Oral BID   rosuvastatin  20 mg Oral QHS   Continuous Infusions:  azithromycin 500 mg (03/30/21 1046)  cefTRIAXone (ROCEPHIN)  IV 2 g (03/30/21 0927)     LOS: 1 day   Time spent: 37 minutes  Darliss Cheney, MD [facility]  03/30/2021, 11:10 AM  Please page via [facility] and do not message via secure chat for urgent patient care matters. Secure chat can be used for non urgent patient care matters.  How to contact the [facility] Attending or Consulting provider [place] or covering provider during after hours [place], for this patient?  Check the care team in [facility] and look for a) attending/consulting [facility] provider listed and b) the [facility] team listed. Page or secure chat [phone]. Log into [email or web address] and use 's universal password  to access. If you do not have the password, please contact the hospital operator. Locate the [facility] provider you are looking for under [facility] and page to a number that you can be directly reached. If you still have difficulty reaching the provider, please page the [facility] (Director on Call) for the Hospitalists listed on amion for assistance.

## 2021-03-30 NOTE — Progress Notes (Signed)
Patient continues to require 15L HFNC to maintain sats around 89-91%. Patient being transferred to [facility] room 8. Report called to Production assistant, radio. [name], patients daughter is at beside and was updated in regards to transfer.  ?

## 2021-03-30 NOTE — Progress Notes (Signed)
?  Echocardiogram ?2D Echocardiogram has been performed. ? ?Elmer Ramp ?03/30/2021, 3:36 PM ?

## 2021-03-30 NOTE — Progress Notes (Signed)
PT Cancellation Note ? ?Patient Details ?Name: Allison Duffy ?MRN: [id number] ?DOB: 20-Oct-1926 ? ? ?Cancelled Treatment:    Reason Eval/Treat Not Completed: Patient not medically ready Patient currently on 15 L HFNC with spO2 92%. RN believes best to hold PT at this time. PT will re-attempt as medically appropriate.  ? ?Travius Crochet A. Gilford Rile, PT, DPT ?Acute Rehabilitation Services ?Pager [phone] ?Office [phone] ? ? ? ?Mihran Lebarron A Reyansh Kushnir ?03/30/2021, 9:52 AM ?

## 2021-03-30 NOTE — Significant Event (Addendum)
Rapid Response Event Note  ? ?Reason for Call :  ?Resp distress, SpO2-71% on 5L HFNC. ? ?Initial Focused Assessment:  ?Pt lying in bed with eyes open. Pt is alert and oriented, breathing is labored. Pt denies chest pain but does say she is SOB. Lungs with scattered crackles, diminished in the bases. Skin hot to touch. ? ?T-98.5, HR-69, BP-131/53, RR-32, SpO2-83% on 5L HFNC. ? ?Pt FiO2 increased to 10L with SpO2 increasing to 88%.  ? ? ?Interventions:  ?PCXR-1. Cardiomegaly with pulmonary vascular congestion. 2. Increased interstitial and airspace opacities in the lungs bilaterally, possible edema or infiltrate. ?EKG-SR with 1st degree HB ?'40mg'$  lasix IV ?D/c MIVF. ?Plan of Care:  ?SpO2-90% on 15L HFNC. Allow lasix time to work. Wean FIO2 as SpO2/WOB allow. Continue to monitor pt. Call RRT if further assistance needed.  ? ?Event Summary:  ? ?MD Notified: Dr. Myna Hidalgo ?Call Time:0130 ?Arrival Time:0140 ?End NIDP:[id number] ? ?Dillard Essex, RN ?

## 2021-03-30 NOTE — Progress Notes (Signed)
Came to room to assess HFNC.  Sat 90-91% on 15 lpm, no distress noted however pt c/o of nausea at the moment.  Per family member in room-RN is aware.  Per discussion w/ RN, pt is waiting for a transfer to another bed that can accept a pt on 15 lpm HFNC.  ?

## 2021-03-30 NOTE — Progress Notes (Signed)
Came to room for HFNC check, Pt is out of room/off the floor currently.   ?

## 2021-03-30 NOTE — Discharge Instructions (Signed)

## 2021-03-31 ENCOUNTER — Inpatient Hospital Stay (HOSPITAL_COMMUNITY): Payer: Medicare Other

## 2021-03-31 LAB — BASIC METABOLIC PANEL
Anion gap: 10 (ref 5–15)
BUN: 18 mg/dL (ref 8–23)
CO2: 24 mmol/L (ref 22–32)
Calcium: 7.8 mg/dL — ABNORMAL LOW (ref 8.9–10.3)
Chloride: 100 mmol/L (ref 98–111)
Creatinine, Ser: 1.23 mg/dL — ABNORMAL HIGH (ref 0.44–1.00)
GFR, Estimated: 41 mL/min — ABNORMAL LOW (ref >60)
Glucose, Bld: 268 mg/dL — ABNORMAL HIGH (ref 70–99)
Potassium: 3.5 mmol/L (ref 3.5–5.1)
Sodium: 134 mmol/L — ABNORMAL LOW (ref 135–145)

## 2021-03-31 LAB — CBC WITH DIFFERENTIAL/PLATELET
Abs Immature Granulocytes: 0.02 10*3/uL (ref 0.00–0.07)
Basophils Absolute: 0 10*3/uL (ref 0.0–0.1)
Basophils Relative: 1 %
Eosinophils Absolute: 0 10*3/uL (ref 0.0–0.5)
Eosinophils Relative: 0 %
HCT: 30 % — ABNORMAL LOW (ref 36.0–46.0)
Hemoglobin: 10.5 g/dL — ABNORMAL LOW (ref 12.0–15.0)
Immature Granulocytes: 0 %
Lymphocytes Relative: 24 %
Lymphs Abs: 1.4 10*3/uL (ref 0.7–4.0)
MCH: 33.9 pg (ref 26.0–34.0)
MCHC: 35 g/dL (ref 30.0–36.0)
MCV: 96.8 fL (ref 80.0–100.0)
Monocytes Absolute: 0.4 10*3/uL (ref 0.1–1.0)
Monocytes Relative: 6 %
Neutro Abs: 3.9 10*3/uL (ref 1.7–7.7)
Neutrophils Relative %: 69 %
Platelets: 141 10*3/uL — ABNORMAL LOW (ref 150–400)
RBC: 3.1 MIL/uL — ABNORMAL LOW (ref 3.87–5.11)
RDW: 11.8 % (ref 11.5–15.5)
WBC: 5.6 10*3/uL (ref 4.0–10.5)
nRBC: 0 % (ref 0.0–0.2)

## 2021-03-31 LAB — GLUCOSE, CAPILLARY
Glucose-Capillary: 172 mg/dL — ABNORMAL HIGH (ref 70–99)
Glucose-Capillary: 271 mg/dL — ABNORMAL HIGH (ref 70–99)
Glucose-Capillary: 264 mg/dL — ABNORMAL HIGH (ref 70–99)
Glucose-Capillary: 191 mg/dL — ABNORMAL HIGH (ref 70–99)

## 2021-03-31 MED ORDER — INSULIN GLARGINE-YFGN 100 UNIT/ML ~~LOC~~ SOLN
10.0000 [IU] | Freq: Every day | SUBCUTANEOUS | Status: DC
  Administered 2021-03-31 – 2021-04-01 (×2): 10 [IU] via SUBCUTANEOUS
  Filled 2021-03-31 (×2): qty 0.1

## 2021-03-31 MED ORDER — AZITHROMYCIN 500 MG PO TABS
500.0000 mg | ORAL_TABLET | Freq: Every day | ORAL | Status: AC
  Administered 2021-04-01 – 2021-04-02 (×2): 500 mg via ORAL
  Filled 2021-03-31 (×2): qty 1

## 2021-03-31 NOTE — Plan of Care (Signed)
  Problem: Education: Goal: Knowledge of General Education information will improve Description: Including pain rating scale, medication(s)/side effects and non-pharmacologic comfort measures Outcome: Progressing   Problem: Nutrition: Goal: Adequate nutrition will be maintained Outcome: Progressing   

## 2021-03-31 NOTE — Plan of Care (Signed)
?  Problem: Education: Goal: Knowledge of General Education information will improve Description: Including pain rating scale, medication(s)/side effects and non-pharmacologic comfort measures Outcome: Progressing   Problem: Clinical Measurements: Goal: Ability to maintain clinical measurements within normal limits will improve Outcome: Progressing Goal: Respiratory complications will improve Outcome: Progressing   Problem: Activity: Goal: Risk for activity intolerance will decrease Outcome: Progressing   Problem: Pain Managment: Goal: General experience of comfort will improve Outcome: Progressing   

## 2021-03-31 NOTE — Progress Notes (Signed)
PROGRESS NOTE    Allison Duffy  PHX:[id number] DOB: 11/11/1926 DOA: 03/29/2021 PCP: Burnard Bunting, MD   Brief Narrative:  HPI: Allison Duffy is a 88 y.o. female with medical history significant of hypertension, hyperlipidemia, chronic diastolic heart failure (last EF noted to be 60-65%), atrial fibrillation on chronic anticoagulation who presents with complaints of fever and malaise over the last 2 days.  Normally patient is getting around with use of a cane and is not on oxygen at baseline.  Her daughter is present at bedside and adds that the patient had complained of feeling dizzy 2 days ago.  Patient had recently had a bout of her "colitis" is which daughter describes as huge bouts of diarrhea for which she was placed on cholestyramine by her primary Dr. Reynaldo Duffy.  She was feeling so weak that she had not taken the medication.  Daughter noted that she had been using her walker to try and get around.  In addition to this patient complained of some shortness of breath, mildly productive cough, chills, and some chest discomfort.  She had not recently been on antibiotics or previously ever been diagnosed with C. difficile.  Denies any blood in stools, abdominal pain, or episodes of vomiting.   Upon EMS arrival patient's O2 saturations were noted to be 85% on room air placed on 4 L of oxygen with improvement to 95%.  Patient was noted to be febrile up to 103 F and given Tylenol 1 g prior to arrival.   In the ED patient was noted to have temperature of 99.8 F with blood pressures elevated up to 180/49, and O2 saturation currently maintained on 3-4 L nasal cannula oxygen.  Chest x-ray noted concern for right middle lobe pneumonia with minimal left basilar airspace disease concerning for atelectasis.  Patient has been given 1 L lactated Ringer's, Rocephin, azithromycin, and 40 mEq of potassium chloride p.o.  Assessment & Plan:   Active Problems:   Coronary artery disease   Acute respiratory  failure with hypoxia secondary to community acquired pneumonia   Type 2 diabetes mellitus with complication, without long-term current use of insulin (HCC)   Chest pain  CAD   Diarrhea   Hypokalemia   Essential hypertension   Hyperlipidemia associated with type 2 diabetes mellitus (HCC)   Lactic acidosis   Chronic diastolic CHF (congestive heart failure) (HCC)   Paroxysmal atrial fibrillation (HCC)   Acute respiratory failure with hypoxia (HCC)  Acute respiratory failure with hypoxia secondary to community acquired pneumonia as well as acute on chronic diastolic CHF: Patient was initially admitted with only pneumonia.  She was started on IV antibiotics and fluids.  Overnight her respiratory distress got worse and she required much higher amount of oxygen.  Repeat chest x-ray showed vascular congestion.  However on my personal review, even the first chest x-ray shows vascular congestion but this was not revealed by official radiology report.  IV fluids were discontinued.  She received 50 mg of Lasix.  Started on Lasix 40 mg IV twice daily.  She has had moderate amount of diuresis.  Still requiring 15 L of high flow oxygen and still complaining of shortness of breath with very minimal improvement.  Procalcitonin unremarkable, COVID-19 negative, influenza negative, respiratory viral panel negative as well.  BNP slightly elevated.  Not much crackles on the lung exam.  We will repeat chest x-ray today.  We will try to wean as soon as we can.  We will continue diuresis as well as  current antibiotics with bronchodilators.  Daily weight and strict I's and O's.   Severe sepsis, not POA: Patient did not meet severe sepsis criteria upon admission however after admission, she developed fever as high as 102.7 and she was tachypneic.  No leukocytosis.  Lactic acid 3.2 along with severe hypoxia with source of infection likely pneumonia, she meets criteria for severe sepsis.  Prior history of CAD now with chest  pain/elevated troponin/demand ischemia: Patient had complaint of chest pain.  Slight elevated troponin which are flat and indicate above demand ischemia. Patient with prior history of left heart cath in 03/2020 which noted proximal LAD to mid LAD lesion of 50% stenosis, second diagonal lesion 75% stenosis, ramus lesion 50% stenosed for which no culprit lesion was identified, and medical management was recommended.. We will check troponins and echo is ordered as well.  Hypokalemia: Resolved.  Continue 40 mEq p.o. daily  Diarrhea Patient reports recent bout of "colitis" where she has been having large bouts of diarrhea.  No prior history of C. Difficile or recent antibiotics given.  Reports being on cholestyramine for treatment and takes a daily probiotic. -Continue probiotic and cholestyramine   Type 2 diabetes mellitus with complication, without long-term current use of insulin (Allison Duffy) On admission glucose elevated up to 283.  Hemoglobin A1c 5.9.  Medication regimen includes metformin 500 mg twice daily with meals, glipizide 10 mg daily, and Januvia 100 mg q. lunch.  Holding all oral hypoglycemic agents.  She is hyperglycemic.  Will add 10 units of Semglee and continue SSI.   Lactic acidosis: Secondary to infection.  Resolved.   Hyperlipidemia associated with type 2 diabetes mellitus (Allison Duffy) - Continue Crestor   Essential hypertension On admission blood pressures elevated up to 180/49.  Home blood pressure regimen includes amlodipine 10 mg nightly, Coreg 12.5 mg twice daily, clonidine 0.1-0.2 mg twice daily, olmesartan 40 mg nightly, and Ranexa 500 twice daily mg.  Continue Coreg, amlodipine, clonidine, Ranexa, and pharmacy substitution of irbesartan for olmesartan.  Blood pressure controlled.   Paroxysmal atrial fibrillation (Allison Duffy): Rates controlled.  Continue Coreg and Eliquis.   DVT prophylaxis:   Eliquis   Code Status: Full Code, I confirmed once again with the daughter and the patient. Family  Communication: None present at bedside.  Plan of care discussed with patient in length and he/she verbalized understanding and agreed with it.  Status is: Inpatient Remains inpatient appropriate because: Patient very sick needs inpatient management.  Estimated body mass index is 28.8 kg/m as calculated from the following:   Height as of this encounter: '5\' 5"'$  (1.651 m).   Weight as of this encounter: 78.5 kg.    Nutritional Assessment: Body mass index is 28.8 kg/m.Marland Kitchen Seen by dietician.  I agree with the assessment and plan as outlined below: Nutrition Status:        . Skin Assessment: I have examined the patient's skin and I agree with the wound assessment as performed by the wound care RN as outlined below:    Consultants:  None  Procedures:  None  Antimicrobials:  Anti-infectives (From admission, onward)    Start     Dose/Rate Route Frequency Ordered Stop   03/30/21 1130  azithromycin (ZITHROMAX) tablet 500 mg  Status:  Discontinued        500 mg Oral Daily 03/30/21 1042 03/30/21 1044   03/30/21 1130  azithromycin (ZITHROMAX) 500 mg in sodium chloride 0.9 % 250 mL IVPB        500 mg 250  mL/hr over 60 Minutes Intravenous Every 24 hours 03/30/21 1044 04/03/21 1129   03/30/21 1000  cefTRIAXone (ROCEPHIN) 2 g in sodium chloride 0.9 % 100 mL IVPB        2 g 200 mL/hr over 30 Minutes Intravenous Every 24 hours 03/29/21 1249 04/03/21 0959   03/30/21 1000  azithromycin (ZITHROMAX) 500 mg in sodium chloride 0.9 % 250 mL IVPB  Status:  Discontinued        500 mg 250 mL/hr over 60 Minutes Intravenous Every 24 hours 03/29/21 1249 03/30/21 1042   03/29/21 1000  cefTRIAXone (ROCEPHIN) 1 g in sodium chloride 0.9 % 100 mL IVPB        1 g 200 mL/hr over 30 Minutes Intravenous  Once 03/29/21 0953 03/29/21 1107   03/29/21 1000  azithromycin (ZITHROMAX) 500 mg in sodium chloride 0.9 % 250 mL IVPB        500 mg 250 mL/hr over 60 Minutes Intravenous  Once 03/29/21 0953 03/29/21 1227          Subjective: Seen and examined.  Still with shortness of breath but she feels slightly improved compared to yesterday.  No chest pain or any other complaint.  Objective: Vitals:   03/31/21 0007 03/31/21 0231 03/31/21 0300 03/31/21 0700  BP: 135/68  (!) 150/59 (!) 145/77  Pulse: 64  60 (!) 59  Resp: '17  20 19  '$ Temp: 97.6 F (36.4 C) 97.6 F (36.4 C) 98.6 F (37 C) 98.7 F (37.1 C)  TempSrc: Oral Oral Oral Oral  SpO2: 95%  93% 91%  Weight:   78.5 kg   Height:        Intake/Output Summary (Last 24 hours) at 03/31/2021 0745 Last data filed at 03/31/2021 9211 Gross per 24 hour  Intake 637 ml  Output 1600 ml  Net -963 ml    Filed Weights   03/29/21 0825 03/31/21 0300  Weight: 74.8 kg 78.5 kg    Examination:  General exam: Appears calm and comfortable, cachectic Respiratory system: Diminished breath sounds with occasional scattered rhonchi bilaterally. Respiratory effort normal. Cardiovascular system: S1 & S2 heard, RRR. No JVD, murmurs, rubs, gallops or clicks. No pedal edema. Gastrointestinal system: Abdomen is nondistended, soft and nontender. No organomegaly or masses felt. Normal bowel sounds heard. Central nervous system: Alert and oriented. No focal neurological deficits. Extremities: Symmetric 5 x 5 power. Skin: No rashes, lesions or ulcers.  Psychiatry: Judgement and insight appear normal. Mood & affect appropriate.   Data Reviewed: I have personally reviewed following labs and imaging studies  CBC: Recent Labs  Lab 03/29/21 0846 03/30/21 0146 03/31/21 0047  WBC 7.1 6.1 5.6  NEUTROABS 5.4  --  3.9  HGB 11.8* 10.5* 10.5*  HCT 33.2* 29.6* 30.0*  MCV 96.2 96.4 96.8  PLT 174 144* 141*    Basic Metabolic Panel: Recent Labs  Lab 03/29/21 0846 03/30/21 0146 03/31/21 0047  NA 133* 132* 134*  K 2.9* 3.5 3.5  CL 100 102 100  CO2 22 21* 24  GLUCOSE 283* 205* 268*  BUN '15 16 18  '$ CREATININE 1.00 1.09* 1.23*  CALCIUM 8.4* 7.9* 7.8*  MG  --  1.7  --    PHOS  --  2.9  --     GFR: Estimated Creatinine Clearance: 30.2 mL/min (A) (by C-G formula based on SCr of 1.23 mg/dL (H)). Liver Function Tests: Recent Labs  Lab 03/29/21 0846  AST 26  ALT 21  ALKPHOS 37*  BILITOT 0.7  PROT 6.3*  ALBUMIN 3.2*    No results for input(s): LIPASE, AMYLASE in the last 168 hours. No results for input(s): AMMONIA in the last 168 hours. Coagulation Profile: No results for input(s): INR, PROTIME in the last 168 hours. Cardiac Enzymes: No results for input(s): CKTOTAL, CKMB, CKMBINDEX, TROPONINI in the last 168 hours. BNP (last 3 results) No results for input(s): PROBNP in the last 8760 hours. HbA1C: Recent Labs    03/29/21 1916  HGBA1C 5.9*    CBG: Recent Labs  Lab 03/30/21 0714 03/30/21 1118 03/30/21 1645 03/30/21 2115 03/31/21 0605  GLUCAP 163* 225* 162* 266* 172*    Lipid Profile: Recent Labs    03/29/21 0846  TRIG 87    Thyroid Function Tests: No results for input(s): TSH, T4TOTAL, FREET4, T3FREE, THYROIDAB in the last 72 hours. Anemia Panel: Recent Labs    03/29/21 0846  FERRITIN 428*    Sepsis Labs: Recent Labs  Lab 03/29/21 0846 03/29/21 1227 03/30/21 1124 03/30/21 2105  PROCALCITON <0.10  --   --   --   LATICACIDVEN 3.1* 3.2* 1.6 1.1     Recent Results (from the past 240 hour(s))  Resp Panel by RT-PCR (Flu A&B, Covid) Nasopharyngeal Swab     Status: None   Collection Time: 03/29/21  8:41 AM   Specimen: Nasopharyngeal Swab; Nasopharyngeal(NP) swabs in vial transport medium  Result Value Ref Range Status   SARS Coronavirus 2 by RT PCR NEGATIVE NEGATIVE Final    Comment: (NOTE) SARS-CoV-2 target nucleic acids are NOT DETECTED.  The SARS-CoV-2 RNA is generally detectable in upper respiratory specimens during the acute phase of infection. The lowest concentration of SARS-CoV-2 viral copies this assay can detect is 138 copies/mL. A negative result does not preclude SARS-Cov-2 infection and should not be  used as the sole basis for treatment or other patient management decisions. A negative result may occur with  improper specimen collection/handling, submission of specimen other than nasopharyngeal swab, presence of viral mutation(s) within the areas targeted by this assay, and inadequate number of viral copies(<138 copies/mL). A negative result must be combined with clinical observations, patient history, and epidemiological information. The expected result is Negative.  Fact Sheet for Patients:  EntrepreneurPulse.com.au  Fact Sheet for Healthcare Providers:  IncredibleEmployment.be  This test is no t yet approved or cleared by the [place] FDA and  has been authorized for detection and/or diagnosis of SARS-CoV-2 by FDA under an Emergency Use Authorization (EUA). This EUA will remain  in effect (meaning this test can be used) for the duration of the COVID-19 declaration under Section 564(b)(1) of the Act, 21 U.S.C.section 360bbb-3(b)(1), unless the authorization is terminated  or revoked sooner.       Influenza A by PCR NEGATIVE NEGATIVE Final   Influenza B by PCR NEGATIVE NEGATIVE Final    Comment: (NOTE) The Xpert Xpress SARS-CoV-2/FLU/RSV plus assay is intended as an aid in the diagnosis of influenza from Nasopharyngeal swab specimens and should not be used as a sole basis for treatment. Nasal washings and aspirates are unacceptable for Xpert Xpress SARS-CoV-2/FLU/RSV testing.  Fact Sheet for Patients: EntrepreneurPulse.com.au  Fact Sheet for Healthcare Providers: IncredibleEmployment.be  This test is not yet approved or cleared by the [place] FDA and has been authorized for detection and/or diagnosis of SARS-CoV-2 by FDA under an Emergency Use Authorization (EUA). This EUA will remain in effect (meaning this test can be used) for the duration of the COVID-19 declaration under Section  564(b)(1) of the Act, 21 U.S.C.  section 360bbb-3(b)(1), unless the authorization is terminated or revoked.  Performed at [facility], [place] [address] [place], [place] [address]   Blood Culture (routine x 2)     Status: None (Preliminary result)   Collection Time: 03/29/21  8:50 AM   Specimen: BLOOD  Result Value Ref Range Status   Specimen Description BLOOD SITE NOT SPECIFIED  Final   Special Requests   Final    BOTTLES DRAWN AEROBIC AND ANAEROBIC Blood Culture results may not be optimal due to an excessive volume of blood received in culture bottles   Culture   Final    NO GROWTH 1 DAY Performed at [facility], [place] [address] [place], [place] [address]    Report Status PENDING  Incomplete  Blood Culture (routine x 2)     Status: None (Preliminary result)   Collection Time: 03/29/21  8:55 AM   Specimen: BLOOD  Result Value Ref Range Status   Specimen Description BLOOD SITE NOT SPECIFIED  Final   Special Requests   Final    BOTTLES DRAWN AEROBIC AND ANAEROBIC Blood Culture results may not be optimal due to an excessive volume of blood received in culture bottles   Culture   Final    NO GROWTH 1 DAY Performed at [facility], [place] [address] [place], [place] [address]    Report Status PENDING  Incomplete  Expectorated Sputum Assessment w Gram Stain, Rflx to Resp Cult     Status: None   Collection Time: 03/30/21 12:50 PM   Specimen: Expectorated Sputum  Result Value Ref Range Status   Specimen Description EXPECTORATED SPUTUM  Final   Special Requests NONE  Final   Sputum evaluation   Final    THIS SPECIMEN IS ACCEPTABLE FOR SPUTUM CULTURE Performed at [facility], [place] [address], [place], [place] [address]    Report Status 03/30/2021 FINAL  Final  Respiratory (~20 pathogens) panel by PCR     Status: None   Collection Time: 03/30/21 12:50 PM   Specimen: Expectorated Sputum; Respiratory  Result Value Ref Range Status   Adenovirus NOT DETECTED NOT  DETECTED Final   Coronavirus 229E NOT DETECTED NOT DETECTED Final    Comment: (NOTE) The Coronavirus on the Respiratory Panel, DOES NOT test for the novel  Coronavirus (2019 nCoV)    Coronavirus HKU1 NOT DETECTED NOT DETECTED Final   Coronavirus NL63 NOT DETECTED NOT DETECTED Final   Coronavirus OC43 NOT DETECTED NOT DETECTED Final   Metapneumovirus NOT DETECTED NOT DETECTED Final   Rhinovirus / Enterovirus NOT DETECTED NOT DETECTED Final   Influenza A NOT DETECTED NOT DETECTED Final   Influenza B NOT DETECTED NOT DETECTED Final   Parainfluenza Virus 1 NOT DETECTED NOT DETECTED Final   Parainfluenza Virus 2 NOT DETECTED NOT DETECTED Final   Parainfluenza Virus 3 NOT DETECTED NOT DETECTED Final   Parainfluenza Virus 4 NOT DETECTED NOT DETECTED Final   Respiratory Syncytial Virus NOT DETECTED NOT DETECTED Final   Bordetella pertussis NOT DETECTED NOT DETECTED Final   Bordetella Parapertussis NOT DETECTED NOT DETECTED Final   Chlamydophila pneumoniae NOT DETECTED NOT DETECTED Final   Mycoplasma pneumoniae NOT DETECTED NOT DETECTED Final    Comment: Performed at [facility], [place]. [address], [place], [place] [address]  Culture, Respiratory w Gram Stain     Status: None (Preliminary result)   Collection Time: 03/30/21 12:50 PM  Result Value Ref Range Status   Specimen Description  EXPECTORATED SPUTUM  Final   Special Requests NONE Reflexed from [id number]  Final   Gram Stain   Final    MODERATE WBC PRESENT,BOTH PMN AND MONONUCLEAR RARE SQUAMOUS EPITHELIAL CELLS PRESENT RARE GRAM POSITIVE COCCI RARE GRAM VARIABLE ROD Performed at [facility], [place] [address] [place], [place] [address]    Culture PENDING  Incomplete   Report Status PENDING  Incomplete      Radiology Studies: DG Chest Port 1 View  Result Date: 03/30/2021 CLINICAL DATA:  Acute respiratory distress. EXAM: PORTABLE CHEST 1 VIEW COMPARISON:  03/29/2021. FINDINGS: The heart size and mediastinal contours are  stable. The pulmonary vasculature is distended. There is atherosclerotic calcification of the aorta. Interstitial prominence with patchy airspace disease is noted in the lungs bilaterally and increased from the prior exam. No effusion or pneumothorax. Right shoulder arthroplasty changes. Stable surgical clips are seen at the thoracic inlet on the left. IMPRESSION: 1. Cardiomegaly with pulmonary vascular congestion. 2. Increased interstitial and airspace opacities in the lungs bilaterally, possible edema or infiltrate. Electronically Signed   By: Brett Fairy M.D.   On: 03/30/2021 02:07   DG Chest Port 1 View  Result Date: 03/29/2021 CLINICAL DATA:  Cough and fever.  Hypoxia. EXAM: PORTABLE CHEST 1 VIEW COMPARISON:  Two-view chest x-ray 11/18/2020 FINDINGS: Heart size is normal. Atherosclerotic changes are again noted at the aortic arch. Changes of COPD noted. New right middle lobe airspace disease is present. Minimal airspace opacity is noted at the left base. Right shoulder arthroplasty noted. Advanced degenerative changes present at the left shoulder. IMPRESSION: 1. New right middle lobe airspace disease compatible with pneumonia. 2. Minimal left basilar airspace disease likely reflects atelectasis. 3. Changes of COPD. 4. Aortic atherosclerosis. Electronically Signed   By: San Morelle M.D.   On: 03/29/2021 08:36   ECHOCARDIOGRAM COMPLETE  Result Date: 03/30/2021    ECHOCARDIOGRAM REPORT   Patient Name:   Allison Duffy Date of Exam: 03/30/2021 Medical Rec #:  [id number]        Height:       65.0 in Accession #:    [id number]       Weight:       165.0 lb Date of Birth:  07/03/1926        BSA:          1.823 m Patient Age:    85 years         BP:           119/51 mmHg Patient Gender: F                HR:           58 bpm. Exam Location:  Inpatient Procedure: 2D Echo, Cardiac Doppler and Color Doppler Indications:    CHF-Acute Diastolic E08.14  History:        Patient has prior history of  Echocardiogram examinations, most                 recent 04/12/2020. CHF, Arrythmias:Atrial Flutter; Risk                 Factors:Hypertension and Diabetes.  Sonographer:    Merrie Roof RDCS Referring Phys: [id number] [place]  1. Left ventricular ejection fraction, by estimation, is 55 to 60%. The left ventricle has normal function. The left ventricle has no regional wall motion abnormalities. Left ventricular diastolic function could not be evaluated.  2. Right ventricular systolic function is normal. The  right ventricular size is normal. There is moderately elevated pulmonary artery systolic pressure. The estimated right ventricular systolic pressure is 36.1 mmHg.  3. Left atrial size was severely dilated.  4. The mitral valve is grossly normal. Mild to moderate mitral valve regurgitation.  5. Tricuspid valve regurgitation is mild to moderate.  6. The aortic valve is tricuspid. Aortic valve regurgitation is not visualized. Aortic valve sclerosis/calcification is present, without any evidence of aortic stenosis. FINDINGS  Left Ventricle: Left ventricular ejection fraction, by estimation, is 55 to 60%. The left ventricle has normal function. The left ventricle has no regional wall motion abnormalities. The left ventricular internal cavity size was normal in size. There is  borderline left ventricular hypertrophy. Left ventricular diastolic function could not be evaluated. Right Ventricle: The right ventricular size is normal. Right vetricular wall thickness was not well visualized. Right ventricular systolic function is normal. There is moderately elevated pulmonary artery systolic pressure. The tricuspid regurgitant velocity is 3.37 m/s, and with an assumed right atrial pressure of 3 mmHg, the estimated right ventricular systolic pressure is 44.3 mmHg. Left Atrium: Left atrial size was severely dilated. Right Atrium: Right atrial size was normal in size. Pericardium: There is no evidence of pericardial  effusion. Mitral Valve: The mitral valve is grossly normal. Mild to moderate mitral valve regurgitation. Tricuspid Valve: The tricuspid valve is normal in structure. Tricuspid valve regurgitation is mild to moderate. Aortic Valve: The aortic valve is tricuspid. Aortic valve regurgitation is not visualized. Aortic valve sclerosis/calcification is present, without any evidence of aortic stenosis. Aortic valve mean gradient measures 6.0 mmHg. Aortic valve peak gradient measures 10.0 mmHg. Aortic valve area, by VTI measures 1.61 cm. Pulmonic Valve: The pulmonic valve was normal in structure. Pulmonic valve regurgitation is not visualized. Aorta: The aortic root and ascending aorta are structurally normal, with no evidence of dilitation. IAS/Shunts: The atrial septum is grossly normal.  LEFT VENTRICLE PLAX 2D LVIDd:         5.50 cm LVIDs:         3.80 cm LV PW:         1.30 cm LV IVS:        1.10 cm LVOT diam:     2.00 cm LV SV:         57 LV SV Index:   31 LVOT Area:     3.14 cm  RIGHT VENTRICLE RV Basal diam:  3.90 cm RV S prime:     10.70 cm/s TAPSE (M-mode): 1.9 cm LEFT ATRIUM              Index        RIGHT ATRIUM           Index LA diam:        5.60 cm  3.07 cm/m   RA Area:     14.20 cm LA Vol (A2C):   120.0 ml 65.83 ml/m  RA Volume:   35.10 ml  19.26 ml/m LA Vol (A4C):   81.8 ml  44.87 ml/m LA Biplane Vol: 105.0 ml 57.60 ml/m  AORTIC VALVE AV Area (Vmax):    1.74 cm AV Area (Vmean):   1.61 cm AV Area (VTI):     1.61 cm AV Vmax:           158.00 cm/s AV Vmean:          112.000 cm/s AV VTI:            0.356 m AV Peak Grad:  10.0 mmHg AV Mean Grad:      6.0 mmHg LVOT Vmax:         87.50 cm/s LVOT Vmean:        57.400 cm/s LVOT VTI:          0.182 m LVOT/AV VTI ratio: 0.51  AORTA Ao Root diam: 2.80 cm MITRAL VALVE                  TRICUSPID VALVE MV Area (PHT): 3.76 cm       TR Peak grad:   45.4 mmHg MV Decel Time: 202 msec       TR Vmax:        337.00 cm/s MR Peak grad:    125.9 mmHg MR Mean grad:     76.0 mmHg    SHUNTS MR Vmax:         561.00 cm/s  Systemic VTI:  0.18 m MR Vmean:        407.0 cm/s   Systemic Diam: 2.00 cm MR PISA:         1.57 cm MR PISA Eff ROA: 10 mm MR PISA Radius:  0.50 cm MV E velocity: 114.00 cm/s MV A velocity: 47.60 cm/s MV E/A ratio:  2.39 Mertie Moores MD Electronically signed by Mertie Moores MD Signature Date/Time: 03/30/2021/4:09:50 PM    Final     Scheduled Meds:  acidophilus  1 capsule Oral QHS   amLODipine  10 mg Oral QHS   apixaban  5 mg Oral BID   aspirin EC  81 mg Oral Daily   carvedilol  12.5 mg Oral BID WC   cloNIDine  0.2 mg Oral Daily   And   cloNIDine  0.1 mg Oral QHS   furosemide  40 mg Intravenous BID   guaiFENesin  600 mg Oral BID   insulin aspart  0-15 Units Subcutaneous TID WC   insulin glargine-yfgn  10 Units Subcutaneous Daily   irbesartan  300 mg Oral Daily   pantoprazole  40 mg Oral Daily   polyvinyl alcohol  1 drop Both Eyes BID   potassium chloride  40 mEq Oral Daily   ranolazine  500 mg Oral BID   rosuvastatin  20 mg Oral QHS   Continuous Infusions:  azithromycin 500 mg (03/30/21 1046)   cefTRIAXone (ROCEPHIN)  IV 2 g (03/30/21 0927)   promethazine (PHENERGAN) injection (IM or IVPB)       LOS: 2 days   Time spent: 30 minutes  Darliss Cheney, MD [facility]  03/31/2021, 7:45 AM  Please page via [facility] and do not message via secure chat for urgent patient care matters. Secure chat can be used for non urgent patient care matters.  How to contact the [facility] Attending or Consulting provider [facility] or covering provider during after hours [place], for this patient?  Check the care team in [facility] and look for a) attending/consulting [facility] provider listed and b) the [facility] team listed. Page or secure chat [phone]. Log into [email or web address] and use 's universal password to access. If you do not have the password, please contact the hospital operator. Locate the [facility] provider you are looking for under [facility] and page to a  number that you can be directly reached. If you still have difficulty reaching the provider, please page the [facility] (Director on Call) for the Hospitalists listed on amion for assistance.

## 2021-03-31 NOTE — Evaluation (Signed)
Physical Therapy Evaluation Patient Details Name: Allison Duffy MRN: [id number] DOB: 09-28-1926 Today's Date: 03/31/2021  History of Present Illness  Pt is a 88 y.o. female who presented 03/29/21 with fever, malaise, SOB, and chest discomfort. Pt admitted with acute respiratory failure with hypoxia secondary to CAP. PMH: HTN, HLD, chronic diastolic heart failure, afib   Clinical Impression  Pt presents with condition above and deficits mentioned below, see PT Problem List. PTA, she was mod I using a hurrycane for mobility, living alone in a 1-level house with 3 STE. Pt denies any recent falls. Pt just began to have someone assist with cleaning but otherwise still drives and does her own shopping. Currently, pt displays deficits in balance, gross strength, and activity tolerance/aerobic endurance. Pt with several posterior LOB bouts when ambulating bedroom distance with a rollator today, needing minA to recover. Pt's SpO2 did decrease to as low as 79% sitting after gait bout and took ~30 sec to rebound to >/= 92% while on 15L O2. Pt's SpO2 remained >/= 90% majority of session and only dropped to below 90% when straining with MMT and towards the end of the gait bout. Due to pt's deficits mentioned above placing her at risk for falls and that she lives alone with PRN support, recommending short-term rehab at a SNF. If pt's endurance and balance improve well quickly and she can manage herself safely, then may be able to return home with HHPT. Will continue to follow acutely.     Recommendations for follow up therapy are one component of a multi-disciplinary discharge planning process, led by the attending physician.  Recommendations may be updated based on patient status, additional functional criteria and insurance authorization.  Follow Up Recommendations Skilled nursing-short term rehab (<3 hours/day) (may progress to HHPT)    Assistance Recommended at Discharge Frequent or constant  Supervision/Assistance  Patient can return home with the following  A little help with walking and/or transfers;A little help with bathing/dressing/bathroom;Assistance with cooking/housework;Assist for transportation;Help with stairs or ramp for entrance    Equipment Recommendations None recommended by PT  Recommendations for Other Services  OT consult    Functional Status Assessment Patient has had a recent decline in their functional status and demonstrates the ability to make significant improvements in function in a reasonable and predictable amount of time.     Precautions / Restrictions Precautions Precautions: Fall;Other (comment) Precaution Comments: monitor SpO2 Restrictions Weight Bearing Restrictions: No      Mobility  Bed Mobility Overal bed mobility: Needs Assistance Bed Mobility: Supine to Sit, Sit to Supine     Supine to sit: Min guard, HOB elevated Sit to supine: Min guard, HOB elevated   General bed mobility comments: Min guard for safety, HOB elevated    Transfers Overall transfer level: Needs assistance Equipment used: Rollator (4 wheels) Transfers: Sit to/from Stand Sit to Stand: Min guard           General transfer comment: Extra time to power up to stand, min guard for safety. Educated on [employer], [employer].    Ambulation/Gait Ambulation/Gait assistance: Min assist Gait Distance (Feet): 20 Feet Assistive device: Rollator (4 wheels) Gait Pattern/deviations: Step-through pattern, Decreased stride length, Trunk flexed Gait velocity: reduced Gait velocity interpretation: <1.31 ft/sec, indicative of household ambulator   General Gait Details: Pt with slow gait and flexed posture. Pt with intermittent posterior LOB needing minA to recover. Ambulated around room and returned to sit when SpO2 dropped below 90% on 15L.  Stairs  Wheelchair Mobility    Modified Rankin (Stroke Patients Only)       Balance Overall balance  assessment: Needs assistance Sitting-balance support: No upper extremity supported, Feet supported Sitting balance-Leahy Scale: Fair     Standing balance support: Bilateral upper extremity supported, During functional activity, Reliant on assistive device for balance Standing balance-Leahy Scale: Poor Standing balance comment: Reliant on UE support and up to minA, LOB bouts noted with minA to recover.                             Pertinent Vitals/Pain Pain Assessment Pain Assessment: Faces Faces Pain Scale: Hurts little more Pain Location: chest with coughing Pain Descriptors / Indicators: Discomfort, Grimacing Pain Intervention(s): Limited activity within patient's tolerance, Monitored during session, Repositioned    Home Living Family/patient expects to be discharged to:: Private residence Living Arrangements: Alone Available Help at Discharge: Family;Available PRN/intermittently Type of Home: House Home Access: Stairs to enter Entrance Stairs-Rails: Right (ascending) Entrance Stairs-Number of Steps: 3   Home Layout: One level Home Equipment: Rollator (4 wheels);Tub bench;Grab bars - tub/shower;Other (comment) (hurrycane)      Prior Function Prior Level of Function : Driving;Independent/Modified Independent             Mobility Comments: Pt mod I using hurrycane. Denies any recent falls. ADLs Comments: Just began to have someone assist with cleaning recently. Still drives and does own shopping.     Hand Dominance   Dominant Hand: Right    Extremity/Trunk Assessment   Upper Extremity Assessment Upper Extremity Assessment: Defer to OT evaluation (MMT scores of grossly 4 throughout; denies numbness/tingling)    Lower Extremity Assessment Lower Extremity Assessment: Generalized weakness (MMT scores of grossly 4 throughout; denies numbness/tingling)    Cervical / Trunk Assessment Cervical / Trunk Assessment: Kyphotic  Communication   Communication:  No difficulties  Cognition Arousal/Alertness: Awake/alert Behavior During Therapy: WFL for tasks assessed/performed Overall Cognitive Status: Within Functional Limits for tasks assessed                                          General Comments General comments (skin integrity, edema, etc.): SpO2 dropped to below 90% on 15L when ambulating and with straining with MMT. Decreased to as low as 79% once sitting after gait bout, needing ~30 seconds to rebound to >/= 92% on 15L    Exercises     Assessment/Plan    PT Assessment Patient needs continued PT services  PT Problem List Decreased strength;Decreased activity tolerance;Decreased range of motion;Decreased balance;Decreased mobility;Decreased knowledge of use of DME;Cardiopulmonary status limiting activity       PT Treatment Interventions DME instruction;Stair training;Gait training;Functional mobility training;Therapeutic activities;Therapeutic exercise;Balance training;Neuromuscular re-education;Patient/family education    PT Goals (Current goals can be found in the Care Plan section)  Acute Rehab PT Goals Patient Stated Goal: to get better and go home PT Goal Formulation: With patient/family Time For Goal Achievement: 04/14/21 Potential to Achieve Goals: Good    Frequency Min 3X/week     Co-evaluation               AM-PAC PT "6 Clicks" Mobility  Outcome Measure Help needed turning from your back to your side while in a flat bed without using bedrails?: A Little Help needed moving from lying on your back to sitting on the side of  a flat bed without using bedrails?: A Little Help needed moving to and from a bed to a chair (including a wheelchair)?: A Little Help needed standing up from a chair using your arms (e.g., wheelchair or bedside chair)?: A Little Help needed to walk in hospital room?: A Little Help needed climbing 3-5 steps with a railing? : A Lot 6 Click Score: 17    End of Session  Equipment Utilized During Treatment: Oxygen Activity Tolerance: Other (comment) (limited by decline in SpO2) Patient left: in bed;with call bell/phone within reach;with bed alarm set;with family/visitor present   PT Visit Diagnosis: Unsteadiness on feet (R26.81);Other abnormalities of gait and mobility (R26.89);Muscle weakness (generalized) (M62.81);Difficulty in walking, not elsewhere classified (R26.2)    Time: 5621-3086 PT Time Calculation (min) (ACUTE ONLY): 24 min   Charges:   PT Evaluation $PT Eval Moderate Complexity: 1 Mod PT Treatments $Therapeutic Activity: 8-22 mins        Moishe Spice, PT, DPT Acute Rehabilitation Services  Pager: [phone] Office: [phone]   Orvan Falconer 03/31/2021, 4:16 PM

## 2021-03-31 NOTE — Plan of Care (Signed)
?  Problem: Nutrition: ?Goal: Adequate nutrition will be maintained ?Outcome: Progressing ?  ?Problem: Activity: ?Goal: Risk for activity intolerance will decrease ?Outcome: Progressing ?  ?

## 2021-04-01 LAB — CBC WITH DIFFERENTIAL/PLATELET
Abs Immature Granulocytes: 0.03 10*3/uL (ref 0.00–0.07)
Basophils Absolute: 0 10*3/uL (ref 0.0–0.1)
Basophils Relative: 1 %
Eosinophils Absolute: 0.2 10*3/uL (ref 0.0–0.5)
Eosinophils Relative: 4 %
HCT: 29.9 % — ABNORMAL LOW (ref 36.0–46.0)
Hemoglobin: 10.6 g/dL — ABNORMAL LOW (ref 12.0–15.0)
Immature Granulocytes: 1 %
Lymphocytes Relative: 32 %
Lymphs Abs: 1.9 10*3/uL (ref 0.7–4.0)
MCH: 34 pg (ref 26.0–34.0)
MCHC: 35.5 g/dL (ref 30.0–36.0)
MCV: 95.8 fL (ref 80.0–100.0)
Monocytes Absolute: 0.5 10*3/uL (ref 0.1–1.0)
Monocytes Relative: 9 %
Neutro Abs: 3.3 10*3/uL (ref 1.7–7.7)
Neutrophils Relative %: 53 %
Platelets: 141 10*3/uL — ABNORMAL LOW (ref 150–400)
RBC: 3.12 MIL/uL — ABNORMAL LOW (ref 3.87–5.11)
RDW: 11.7 % (ref 11.5–15.5)
WBC: 6 10*3/uL (ref 4.0–10.5)
nRBC: 0 % (ref 0.0–0.2)

## 2021-04-01 LAB — BASIC METABOLIC PANEL
Anion gap: 10 (ref 5–15)
BUN: 16 mg/dL (ref 8–23)
CO2: 24 mmol/L (ref 22–32)
Calcium: 7.7 mg/dL — ABNORMAL LOW (ref 8.9–10.3)
Chloride: 101 mmol/L (ref 98–111)
Creatinine, Ser: 1.17 mg/dL — ABNORMAL HIGH (ref 0.44–1.00)
GFR, Estimated: 44 mL/min — ABNORMAL LOW (ref >60)
Glucose, Bld: 172 mg/dL — ABNORMAL HIGH (ref 70–99)
Potassium: 3.2 mmol/L — ABNORMAL LOW (ref 3.5–5.1)
Sodium: 135 mmol/L (ref 135–145)

## 2021-04-01 LAB — CULTURE, RESPIRATORY W GRAM STAIN: Culture: NORMAL

## 2021-04-01 LAB — GLUCOSE, CAPILLARY
Glucose-Capillary: 182 mg/dL — ABNORMAL HIGH (ref 70–99)
Glucose-Capillary: 282 mg/dL — ABNORMAL HIGH (ref 70–99)
Glucose-Capillary: 285 mg/dL — ABNORMAL HIGH (ref 70–99)
Glucose-Capillary: 266 mg/dL — ABNORMAL HIGH (ref 70–99)

## 2021-04-01 LAB — MRSA NEXT GEN BY PCR, NASAL: MRSA by PCR Next Gen: NOT DETECTED

## 2021-04-01 LAB — MAGNESIUM: Magnesium: 1.5 mg/dL — ABNORMAL LOW (ref 1.7–2.4)

## 2021-04-01 MED ORDER — POTASSIUM CHLORIDE CRYS ER 20 MEQ PO TBCR
20.0000 meq | EXTENDED_RELEASE_TABLET | Freq: Once | ORAL | Status: AC
  Administered 2021-04-01: 20 meq via ORAL
  Filled 2021-04-01: qty 1

## 2021-04-01 MED ORDER — INSULIN GLARGINE-YFGN 100 UNIT/ML ~~LOC~~ SOLN
5.0000 [IU] | Freq: Once | SUBCUTANEOUS | Status: AC
  Administered 2021-04-01: 5 [IU] via SUBCUTANEOUS
  Filled 2021-04-01: qty 0.05

## 2021-04-01 MED ORDER — MAGNESIUM SULFATE 2 GM/50ML IV SOLN
2.0000 g | Freq: Once | INTRAVENOUS | Status: AC
  Administered 2021-04-01: 2 g via INTRAVENOUS
  Filled 2021-04-01: qty 50

## 2021-04-01 MED ORDER — INSULIN GLARGINE-YFGN 100 UNIT/ML ~~LOC~~ SOLN
15.0000 [IU] | Freq: Every day | SUBCUTANEOUS | Status: DC
  Filled 2021-04-01: qty 0.15

## 2021-04-01 MED ORDER — WHITE PETROLATUM EX OINT
TOPICAL_OINTMENT | CUTANEOUS | Status: DC | PRN
  Filled 2021-04-01: qty 28.35

## 2021-04-01 NOTE — Plan of Care (Signed)

## 2021-04-01 NOTE — Progress Notes (Signed)
Mobility Specialist Criteria Algorithm Info. ? ? 04/01/21 1100  ?Oxygen Therapy  ?SpO2 98 %  ?O2 Device Nasal Cannula  ?Patient Activity (if Appropriate) Ambulating  ?Pulse Oximetry Type Continuous  ?Mobility  ?Activity Ambulated with assistance in room;Ambulated with assistance in hallway  ?Range of Motion/Exercises Active;All extremities  ?Level of Assistance Contact guard assist, steadying assist  ?Assistive Device Front wheel walker  ?Distance Ambulated (ft) 40 ft  ?Activity Response Tolerated well  ? ?Patient received in recliner chair eager to participate in mobility. Reported using cane but recently started using RW d/t increased instability. Stood w/cues for hand placement and required increased time for sequencing. Ambulated short distance in room and hallway min guard with slow steady gait. Distance limited secondary to fatigue. Oxygen 97-100% on 4LO2 throughout. Returned to room without complaint or incident. Was left in recliner with all needs met, call bell in reach.    ? ?04/01/2021 ?2:16 PM ? ?[place] Tilford Deaton, CMS, BS EXP ?Acute Rehabilitation Services  ?[facility]:[phone] ?Office: [phone] ? ?

## 2021-04-01 NOTE — Care Management Important Message (Signed)
Important Message ? ?Patient Details  ?Name: Allison Duffy ?MRN: [id number] ?Date of Birth: 20-Dec-1926 ? ? ?Medicare Important Message Given:  Yes ? ? ? ? ?Meiko Stranahan ?04/01/2021, 2:41 PM ?

## 2021-04-01 NOTE — Progress Notes (Addendum)
PROGRESS NOTE    Allison Duffy  WYO:[id number] DOB: 1926/06/10 DOA: 03/29/2021 PCP: Burnard Bunting, MD   Brief Narrative:  HPI: Allison Duffy is a 88 y.o. female with medical history significant of hypertension, hyperlipidemia, chronic diastolic heart failure (last EF noted to be 60-65%), atrial fibrillation on chronic anticoagulation who presents with complaints of fever and malaise over the last 2 days.  Normally patient is getting around with use of a cane and is not on oxygen at baseline.  Her daughter is present at bedside and adds that the patient had complained of feeling dizzy 2 days ago.  Patient had recently had a bout of her "colitis" is which daughter describes as huge bouts of diarrhea for which she was placed on cholestyramine by her primary Dr. Reynaldo Minium.  She was feeling so weak that she had not taken the medication.  Daughter noted that she had been using her walker to try and get around.  In addition to this patient complained of some shortness of breath, mildly productive cough, chills, and some chest discomfort.  She had not recently been on antibiotics or previously ever been diagnosed with C. difficile.  Denies any blood in stools, abdominal pain, or episodes of vomiting.   Upon EMS arrival patient's O2 saturations were noted to be 85% on room air placed on 4 L of oxygen with improvement to 95%.  Patient was noted to be febrile up to 103 F and given Tylenol 1 g prior to arrival.   In the ED patient was noted to have temperature of 99.8 F with blood pressures elevated up to 180/49, and O2 saturation currently maintained on 3-4 L nasal cannula oxygen.  Chest x-ray noted concern for right middle lobe pneumonia with minimal left basilar airspace disease concerning for atelectasis.  Patient has been given 1 L lactated Ringer's, Rocephin, azithromycin, and 40 mEq of potassium chloride p.o.  Assessment & Plan:   Active Problems:   Coronary artery disease   Acute respiratory  failure with hypoxia secondary to community acquired pneumonia   Type 2 diabetes mellitus with complication, without long-term current use of insulin (HCC)   Chest pain CAD   Diarrhea   Hypokalemia   Essential hypertension   Hyperlipidemia associated with type 2 diabetes mellitus (HCC)   Lactic acidosis   Chronic diastolic CHF (congestive heart failure) (HCC)   Paroxysmal atrial fibrillation (HCC)   Acute respiratory failure with hypoxia (HCC)  Acute respiratory failure with hypoxia secondary to community acquired pneumonia as well as acute on chronic diastolic CHF, POA: Patient was initially admitted with only pneumonia.  She was started on IV antibiotics and fluids.  Overnight her respiratory distress got worse and she required much higher amount of oxygen.  Repeat chest x-ray showed vascular congestion.  However on my personal review, even the first chest x-ray shows vascular congestion but this was not revealed by official radiology report.  IV fluids were discontinued.  She received 50 mg of Lasix.  Started on Lasix 40 mg IV twice daily.  Over last 24 hours, she has had good urine output but she is still net +500 cc since admission.  However she is feeling better and she is now on 5 L of oxygen.  We will continue current diuresis, antibiotics, bronchodilators and rest of the management.  Procalcitonin unremarkable, COVID-19 negative, influenza negative, respiratory viral panel negative as well.  BNP slightly elevated.  Severe sepsis, not POA: Patient did not meet severe sepsis criteria upon admission  however after admission, she developed fever as high as 102.7 and she was tachypneic.  No leukocytosis.  Lactic acid 3.2 along with severe hypoxia with source of infection likely pneumonia, she meets criteria for severe sepsis.  Prior history of CAD now with chest pain/elevated troponin/demand ischemia: Patient had complaint of chest pain.  Slight elevated troponin which are flat and indicate above  demand ischemia. Patient with prior history of left heart cath in 03/2020 which noted proximal LAD to mid LAD lesion of 50% stenosis, second diagonal lesion 75% stenosis, ramus lesion 50% stenosed for which no culprit lesion was identified, and medical management was recommended..  Echo shows normal ejection fraction with no wall motion abnormality.  Troponin slightly elevated but flat.  Hypokalemia: slightly low continue 40 mEq p.o. daily, plus she received extra dose of potassium chloride as well.  Hypomagnesemia: We will replace  Diarrhea Patient reports recent bout of "colitis" where she has been having large bouts of diarrhea.  No prior history of C. Difficile or recent antibiotics given.  Reports being on cholestyramine for treatment and takes a daily probiotic. -Continue probiotic and cholestyramine   Type 2 diabetes mellitus with complication, without long-term current use of insulin ([place]) On admission glucose elevated up to 283.  Hemoglobin A1c 5.9.  Medication regimen includes metformin 500 mg twice daily with meals, glipizide 10 mg daily, and Januvia 100 mg q. lunch.  Holding all oral hypoglycemic agents.  She is still hyperglycemic.  Will increase Semglee to 15 units and continue SSI.   Lactic acidosis: Secondary to infection.  Resolved.   Hyperlipidemia associated with type 2 diabetes mellitus ([place]) - Continue Crestor   Essential hypertension On admission blood pressures elevated up to 180/49.  Home blood pressure regimen includes amlodipine 10 mg nightly, Coreg 12.5 mg twice daily, clonidine 0.1-0.2 mg twice daily, olmesartan 40 mg nightly, and Ranexa 500 twice daily mg.  Continue Coreg, amlodipine, clonidine, Ranexa, and pharmacy substitution of irbesartan for olmesartan.  Blood pressure controlled.   Paroxysmal atrial fibrillation ([place]): Rates controlled.  Continue Coreg and Eliquis.   DVT prophylaxis:   Eliquis   Code Status: Full Code, I confirmed once again with the  daughter and the patient. Family Communication: Daughter present at bedside.  Plan of care discussed with patient in length and he/she verbalized understanding and agreed with it.  Status is: Inpatient Remains inpatient appropriate because: Patient very sick needs inpatient management.  Estimated body mass index is 28.17 kg/m as calculated from the following:   Height as of this encounter: '5\' 5"'$  (1.651 m).   Weight as of this encounter: 76.8 kg.    Nutritional Assessment: Body mass index is 28.17 kg/m.Marland Kitchen Seen by dietician.  I agree with the assessment and plan as outlined below: Nutrition Status:        . Skin Assessment: I have examined the patient's skin and I agree with the wound assessment as performed by the wound care RN as outlined below:    Consultants:  None  Procedures:  None  Antimicrobials:  Anti-infectives (From admission, onward)    Start     Dose/Rate Route Frequency Ordered Stop   04/01/21 1000  azithromycin (ZITHROMAX) tablet 500 mg        500 mg Oral Daily 03/31/21 1448 04/03/21 0959   03/30/21 1130  azithromycin (ZITHROMAX) tablet 500 mg  Status:  Discontinued        500 mg Oral Daily 03/30/21 1042 03/30/21 1044   03/30/21 1130  azithromycin (  ZITHROMAX) 500 mg in sodium chloride 0.9 % 250 mL IVPB  Status:  Discontinued        500 mg 250 mL/hr over 60 Minutes Intravenous Every 24 hours 03/30/21 1044 03/31/21 1448   03/30/21 1000  cefTRIAXone (ROCEPHIN) 2 g in sodium chloride 0.9 % 100 mL IVPB        2 g 200 mL/hr over 30 Minutes Intravenous Every 24 hours 03/29/21 1249 04/03/21 0959   03/30/21 1000  azithromycin (ZITHROMAX) 500 mg in sodium chloride 0.9 % 250 mL IVPB  Status:  Discontinued        500 mg 250 mL/hr over 60 Minutes Intravenous Every 24 hours 03/29/21 1249 03/30/21 1042   03/29/21 1000  cefTRIAXone (ROCEPHIN) 1 g in sodium chloride 0.9 % 100 mL IVPB        1 g 200 mL/hr over 30 Minutes Intravenous  Once 03/29/21 0953 03/29/21 1107    03/29/21 1000  azithromycin (ZITHROMAX) 500 mg in sodium chloride 0.9 % 250 mL IVPB        500 mg 250 mL/hr over 60 Minutes Intravenous  Once 03/29/21 0953 03/29/21 1227         Subjective: Seen and examined.  She states that she feels much better.  She is clearly able to speak in full sentences today.  Great improvement.  Daughter at the bedside.  She did eat biscuit with bacon this morning and I did counsel her against eating a lot of salt.  She understands.  Objective: Vitals:   04/01/21 0341 04/01/21 0632 04/01/21 0731 04/01/21 0930  BP: (!) 147/62  137/62 (!) 150/73  Pulse: 62  67   Resp: 19  12   Temp: 99.3 F (37.4 C)     TempSrc: Oral     SpO2: 95% 96% 99%   Weight: 76.8 kg     Height:        Intake/Output Summary (Last 24 hours) at 04/01/2021 1204 Last data filed at 04/01/2021 0734 Gross per 24 hour  Intake 2065.07 ml  Output 2800 ml  Net -734.93 ml    Filed Weights   03/29/21 0825 03/31/21 0300 04/01/21 0341  Weight: 74.8 kg 78.5 kg 76.8 kg    Examination:  General exam: Appears calm and comfortable  Respiratory system: Clear to auscultation. Respiratory effort normal. Cardiovascular system: S1 & S2 heard, RRR. No JVD, murmurs, rubs, gallops or clicks. No pedal edema. Gastrointestinal system: Abdomen is nondistended, soft and nontender. No organomegaly or masses felt. Normal bowel sounds heard. Central nervous system: Alert and oriented. No focal neurological deficits. Extremities: Symmetric 5 x 5 power. Skin: No rashes, lesions or ulcers.  Psychiatry: Judgement and insight appear normal. Mood & affect appropriate.    Data Reviewed: I have personally reviewed following labs and imaging studies  CBC: Recent Labs  Lab 03/29/21 0846 03/30/21 0146 03/31/21 0047 04/01/21 0052  WBC 7.1 6.1 5.6 6.0  NEUTROABS 5.4  --  3.9 3.3  HGB 11.8* 10.5* 10.5* 10.6*  HCT 33.2* 29.6* 30.0* 29.9*  MCV 96.2 96.4 96.8 95.8  PLT 174 144* 141* 141*    Basic Metabolic  Panel: Recent Labs  Lab 03/29/21 0846 03/30/21 0146 03/31/21 0047 04/01/21 0052  NA 133* 132* 134* 135  K 2.9* 3.5 3.5 3.2*  CL 100 102 100 101  CO2 22 21* 24 24  GLUCOSE 283* 205* 268* 172*  BUN '15 16 18 16  '$ CREATININE 1.00 1.09* 1.23* 1.17*  CALCIUM 8.4* 7.9* 7.8* 7.7*  MG  --  1.7  --  1.5*  PHOS  --  2.9  --   --     GFR: Estimated Creatinine Clearance: 31.4 mL/min (A) (by C-G formula based on SCr of 1.17 mg/dL (H)). Liver Function Tests: Recent Labs  Lab 03/29/21 0846  AST 26  ALT 21  ALKPHOS 37*  BILITOT 0.7  PROT 6.3*  ALBUMIN 3.2*    No results for input(s): LIPASE, AMYLASE in the last 168 hours. No results for input(s): AMMONIA in the last 168 hours. Coagulation Profile: No results for input(s): INR, PROTIME in the last 168 hours. Cardiac Enzymes: No results for input(s): CKTOTAL, CKMB, CKMBINDEX, TROPONINI in the last 168 hours. BNP (last 3 results) No results for input(s): PROBNP in the last 8760 hours. HbA1C: Recent Labs    03/29/21 1916  HGBA1C 5.9*    CBG: Recent Labs  Lab 03/31/21 1133 03/31/21 1651 03/31/21 2104 04/01/21 0627 04/01/21 1112  GLUCAP 271* 264* 191* 182* 282*    Lipid Profile: No results for input(s): CHOL, HDL, LDLCALC, TRIG, CHOLHDL, LDLDIRECT in the last 72 hours.  Thyroid Function Tests: No results for input(s): TSH, T4TOTAL, FREET4, T3FREE, THYROIDAB in the last 72 hours. Anemia Panel: No results for input(s): VITAMINB12, FOLATE, FERRITIN, TIBC, IRON, RETICCTPCT in the last 72 hours.  Sepsis Labs: Recent Labs  Lab 03/29/21 0846 03/29/21 1227 03/30/21 1124 03/30/21 2105  PROCALCITON <0.10  --   --   --   LATICACIDVEN 3.1* 3.2* 1.6 1.1     Recent Results (from the past 240 hour(s))  Resp Panel by RT-PCR (Flu A&B, Covid) Nasopharyngeal Swab     Status: None   Collection Time: 03/29/21  8:41 AM   Specimen: Nasopharyngeal Swab; Nasopharyngeal(NP) swabs in vial transport medium  Result Value Ref Range  Status   SARS Coronavirus 2 by RT PCR NEGATIVE NEGATIVE Final    Comment: (NOTE) SARS-CoV-2 target nucleic acids are NOT DETECTED.  The SARS-CoV-2 RNA is generally detectable in upper respiratory specimens during the acute phase of infection. The lowest concentration of SARS-CoV-2 viral copies this assay can detect is 138 copies/mL. A negative result does not preclude SARS-Cov-2 infection and should not be used as the sole basis for treatment or other patient management decisions. A negative result may occur with  improper specimen collection/handling, submission of specimen other than nasopharyngeal swab, presence of viral mutation(s) within the areas targeted by this assay, and inadequate number of viral copies(<138 copies/mL). A negative result must be combined with clinical observations, patient history, and epidemiological information. The expected result is Negative.  Fact Sheet for Patients:  EntrepreneurPulse.com.au  Fact Sheet for Healthcare Providers:  IncredibleEmployment.be  This test is no t yet approved or cleared by the [place] FDA and  has been authorized for detection and/or diagnosis of SARS-CoV-2 by FDA under an Emergency Use Authorization (EUA). This EUA will remain  in effect (meaning this test can be used) for the duration of the COVID-19 declaration under Section 564(b)(1) of the Act, 21 U.S.C.section 360bbb-3(b)(1), unless the authorization is terminated  or revoked sooner.       Influenza A by PCR NEGATIVE NEGATIVE Final   Influenza B by PCR NEGATIVE NEGATIVE Final    Comment: (NOTE) The Xpert Xpress SARS-CoV-2/FLU/RSV plus assay is intended as an aid in the diagnosis of influenza from Nasopharyngeal swab specimens and should not be used as a sole basis for treatment. Nasal washings and aspirates are unacceptable for Xpert Xpress SARS-CoV-2/FLU/RSV testing.  Fact Sheet for  Patients: EntrepreneurPulse.com.au  Fact Sheet for Healthcare Providers: IncredibleEmployment.be  This test is not yet approved or cleared by the [place] FDA and has been authorized for detection and/or diagnosis of SARS-CoV-2 by FDA under an Emergency Use Authorization (EUA). This EUA will remain in effect (meaning this test can be used) for the duration of the COVID-19 declaration under Section 564(b)(1) of the Act, 21 U.S.C. section 360bbb-3(b)(1), unless the authorization is terminated or revoked.  Performed at [facility], [place] [address] [place], [place] [address]   Blood Culture (routine x 2)     Status: None (Preliminary result)   Collection Time: 03/29/21  8:50 AM   Specimen: BLOOD  Result Value Ref Range Status   Specimen Description BLOOD SITE NOT SPECIFIED  Final   Special Requests   Final    BOTTLES DRAWN AEROBIC AND ANAEROBIC Blood Culture results may not be optimal due to an excessive volume of blood received in culture bottles   Culture   Final    NO GROWTH 3 DAYS Performed at [facility], [place] [address] [place], [place] [address]    Report Status PENDING  Incomplete  Blood Culture (routine x 2)     Status: None (Preliminary result)   Collection Time: 03/29/21  8:55 AM   Specimen: BLOOD  Result Value Ref Range Status   Specimen Description BLOOD SITE NOT SPECIFIED  Final   Special Requests   Final    BOTTLES DRAWN AEROBIC AND ANAEROBIC Blood Culture results may not be optimal due to an excessive volume of blood received in culture bottles   Culture   Final    NO GROWTH 3 DAYS Performed at [facility], [place] [address], [place], [place] [address]    Report Status PENDING  Incomplete  Expectorated Sputum Assessment w Gram Stain, Rflx to Resp Cult     Status: None   Collection Time: 03/30/21 12:50 PM   Specimen: Expectorated Sputum  Result Value Ref Range Status   Specimen Description EXPECTORATED  SPUTUM  Final   Special Requests NONE  Final   Sputum evaluation   Final    THIS SPECIMEN IS ACCEPTABLE FOR SPUTUM CULTURE Performed at [facility], [place] [address], [place], [place] [address]    Report Status 03/30/2021 FINAL  Final  Respiratory (~20 pathogens) panel by PCR     Status: None   Collection Time: 03/30/21 12:50 PM   Specimen: Expectorated Sputum; Respiratory  Result Value Ref Range Status   Adenovirus NOT DETECTED NOT DETECTED Final   Coronavirus 229E NOT DETECTED NOT DETECTED Final    Comment: (NOTE) The Coronavirus on the Respiratory Panel, DOES NOT test for the novel  Coronavirus (2019 nCoV)    Coronavirus HKU1 NOT DETECTED NOT DETECTED Final   Coronavirus NL63 NOT DETECTED NOT DETECTED Final   Coronavirus OC43 NOT DETECTED NOT DETECTED Final   Metapneumovirus NOT DETECTED NOT DETECTED Final   Rhinovirus / Enterovirus NOT DETECTED NOT DETECTED Final   Influenza A NOT DETECTED NOT DETECTED Final   Influenza B NOT DETECTED NOT DETECTED Final   Parainfluenza Virus 1 NOT DETECTED NOT DETECTED Final   Parainfluenza Virus 2 NOT DETECTED NOT DETECTED Final   Parainfluenza Virus 3 NOT DETECTED NOT DETECTED Final   Parainfluenza Virus 4 NOT DETECTED NOT DETECTED Final   Respiratory Syncytial Virus NOT DETECTED NOT DETECTED Final   Bordetella pertussis NOT DETECTED NOT DETECTED Final   Bordetella  Parapertussis NOT DETECTED NOT DETECTED Final   Chlamydophila pneumoniae NOT DETECTED NOT DETECTED Final   Mycoplasma pneumoniae NOT DETECTED NOT DETECTED Final    Comment: Performed at [facility], [place] [address] [place], [place] [address]  Culture, Respiratory w Gram Stain     Status: None   Collection Time: 03/30/21 12:50 PM  Result Value Ref Range Status   Specimen Description EXPECTORATED SPUTUM  Final   Special Requests NONE Reflexed from [id number]  Final   Gram Stain   Final    MODERATE WBC PRESENT,BOTH PMN AND MONONUCLEAR RARE SQUAMOUS EPITHELIAL CELLS  PRESENT RARE GRAM POSITIVE COCCI RARE GRAM VARIABLE ROD    Culture   Final    FEW Normal respiratory flora-no Staph aureus or Pseudomonas seen Performed at [facility], [place] [address] [place], [place] [address]    Report Status 04/01/2021 FINAL  Final  MRSA Next Gen by PCR, Nasal     Status: None   Collection Time: 04/01/21 12:56 AM   Specimen: Nasal Mucosa; Nasal Swab  Result Value Ref Range Status   MRSA by PCR Next Gen NOT DETECTED NOT DETECTED Final    Comment: (NOTE) The GeneXpert MRSA Assay (FDA approved for NASAL specimens only), is one component of a comprehensive MRSA colonization surveillance program. It is not intended to diagnose MRSA infection nor to guide or monitor treatment for MRSA infections. Test performance is not FDA approved in patients less than 42 years old. Performed at [facility], [place] [address] [place], [place] [address]       Radiology Studies: DG CHEST PORT 1 VIEW  Result Date: 03/31/2021 CLINICAL DATA:  CHF EXAM: PORTABLE CHEST 1 VIEW COMPARISON:  03/30/2021, 04/11/2020 FINDINGS: Bilateral mild interstitial thickening. Superimposed right lower lobe airspace disease. No pleural effusion or pneumothorax. Stable cardiomediastinal silhouette. No acute osseous abnormality. 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 Right shoulder arthroplasty. IMPRESSION: 1. Mild CHF. 2. Right lower lobe airspace disease concerning for pneumonia. Electronically Signed   By: Kathreen Devoid M.D.   On: 03/31/2021 10:19   ECHOCARDIOGRAM COMPLETE  Result Date: 03/30/2021    ECHOCARDIOGRAM REPORT   Patient Name:   Allison Duffy Date of Exam: 03/30/2021 Medical Rec #:  [id number]        Height:       65.0 in Accession #:    [id number]       Weight:       165.0 lb Date of Birth:  19-Jul-1926        BSA:          1.823 m Patient Age:    29 years         BP:           119/51 mmHg Patient Gender: F                HR:           58 bpm. Exam Location:  Inpatient  Procedure: 2D Echo, Cardiac Doppler and Color Doppler Indications:    CHF-Acute Diastolic C58.52  History:        Patient has prior history of Echocardiogram examinations, most                 recent 04/12/2020. CHF, Arrythmias:Atrial Flutter; Risk                 Factors:Hypertension and Diabetes.  Sonographer:    Merrie Roof RDCS Referring Phys: [id number] [facility]  IMPRESSIONS  1. Left ventricular ejection fraction, by estimation, is 55 to 60%. The left ventricle has normal function. The left ventricle has no regional wall motion abnormalities. Left ventricular diastolic function could not be evaluated.  2. Right ventricular systolic function is normal. The right ventricular size is normal. There is moderately elevated pulmonary artery systolic pressure. The estimated right ventricular systolic pressure is 86.7 mmHg.  3. Left atrial size was severely dilated.  4. The mitral valve is grossly normal. Mild to moderate mitral valve regurgitation.  5. Tricuspid valve regurgitation is mild to moderate.  6. The aortic valve is tricuspid. Aortic valve regurgitation is not visualized. Aortic valve sclerosis/calcification is present, without any evidence of aortic stenosis. FINDINGS  Left Ventricle: Left ventricular ejection fraction, by estimation, is 55 to 60%. The left ventricle has normal function. The left ventricle has no regional wall motion abnormalities. The left ventricular internal cavity size was normal in size. There is  borderline left ventricular hypertrophy. Left ventricular diastolic function could not be evaluated. Right Ventricle: The right ventricular size is normal. Right vetricular wall thickness was not well visualized. Right ventricular systolic function is normal. There is moderately elevated pulmonary artery systolic pressure. The tricuspid regurgitant velocity is 3.37 m/s, and with an assumed right atrial pressure of 3 mmHg, the estimated right ventricular systolic pressure is 67.2 mmHg. Left  Atrium: Left atrial size was severely dilated. Right Atrium: Right atrial size was normal in size. Pericardium: There is no evidence of pericardial effusion. Mitral Valve: The mitral valve is grossly normal. Mild to moderate mitral valve regurgitation. Tricuspid Valve: The tricuspid valve is normal in structure. Tricuspid valve regurgitation is mild to moderate. Aortic Valve: The aortic valve is tricuspid. Aortic valve regurgitation is not visualized. Aortic valve sclerosis/calcification is present, without any evidence of aortic stenosis. Aortic valve mean gradient measures 6.0 mmHg. Aortic valve peak gradient measures 10.0 mmHg. Aortic valve area, by VTI measures 1.61 cm. Pulmonic Valve: The pulmonic valve was normal in structure. Pulmonic valve regurgitation is not visualized. Aorta: The aortic root and ascending aorta are structurally normal, with no evidence of dilitation. IAS/Shunts: The atrial septum is grossly normal.  LEFT VENTRICLE PLAX 2D LVIDd:         5.50 cm LVIDs:         3.80 cm LV PW:         1.30 cm LV IVS:        1.10 cm LVOT diam:     2.00 cm LV SV:         57 LV SV Index:   31 LVOT Area:     3.14 cm  RIGHT VENTRICLE RV Basal diam:  3.90 cm RV S prime:     10.70 cm/s TAPSE (M-mode): 1.9 cm LEFT ATRIUM              Index        RIGHT ATRIUM           Index LA diam:        5.60 cm  3.07 cm/m   RA Area:     14.20 cm LA Vol (A2C):   120.0 ml 65.83 ml/m  RA Volume:   35.10 ml  19.26 ml/m LA Vol (A4C):   81.8 ml  44.87 ml/m LA Biplane Vol: 105.0 ml 57.60 ml/m  AORTIC VALVE AV Area (Vmax):    1.74 cm AV Area (Vmean):   1.61 cm AV Area (VTI):     1.61 cm AV  Vmax:           158.00 cm/s AV Vmean:          112.000 cm/s AV VTI:            0.356 m AV Peak Grad:      10.0 mmHg AV Mean Grad:      6.0 mmHg LVOT Vmax:         87.50 cm/s LVOT Vmean:        57.400 cm/s LVOT VTI:          0.182 m LVOT/AV VTI ratio: 0.51  AORTA Ao Root diam: 2.80 cm MITRAL VALVE                  TRICUSPID VALVE MV Area  (PHT): 3.76 cm       TR Peak grad:   45.4 mmHg MV Decel Time: 202 msec       TR Vmax:        337.00 cm/s MR Peak grad:    125.9 mmHg MR Mean grad:    76.0 mmHg    SHUNTS MR Vmax:         561.00 cm/s  Systemic VTI:  0.18 m MR Vmean:        407.0 cm/s   Systemic Diam: 2.00 cm MR PISA:         1.57 cm MR PISA Eff ROA: 10 mm MR PISA Radius:  0.50 cm MV E velocity: 114.00 cm/s MV A velocity: 47.60 cm/s MV E/A ratio:  2.39 Mertie Moores MD Electronically signed by Mertie Moores MD Signature Date/Time: 03/30/2021/4:09:50 PM    Final     Scheduled Meds:  acidophilus  1 capsule Oral QHS   amLODipine  10 mg Oral QHS   apixaban  5 mg Oral BID   aspirin EC  81 mg Oral Daily   azithromycin  500 mg Oral Daily   carvedilol  12.5 mg Oral BID WC   cloNIDine  0.2 mg Oral Daily   And   cloNIDine  0.1 mg Oral QHS   furosemide  40 mg Intravenous BID   guaiFENesin  600 mg Oral BID   insulin aspart  0-15 Units Subcutaneous TID WC   insulin glargine-yfgn  10 Units Subcutaneous Daily   irbesartan  300 mg Oral Daily   pantoprazole  40 mg Oral Daily   polyvinyl alcohol  1 drop Both Eyes BID   potassium chloride  40 mEq Oral Daily   ranolazine  500 mg Oral BID   rosuvastatin  20 mg Oral QHS   Continuous Infusions:  cefTRIAXone (ROCEPHIN)  IV 2 g (04/01/21 0941)   promethazine (PHENERGAN) injection (IM or IVPB)       LOS: 3 days   Time spent: 28 minutes  Darliss Cheney, MD [facility]  04/01/2021, 12:04 PM  Please page via [place] [place] and do not message via secure chat for urgent patient care matters. Secure chat can be used for non urgent patient care matters.  How to contact the [facility] Attending or Consulting provider [employer] or covering provider during after hours [place], for this patient?  Check the care team in [facility] and look for a) attending/consulting TRH provider listed and b) the [facility] team listed. Page or secure chat [phone]. Log into [email or web address] and use [facility]'s universal password to access. If you  do not have the password, please contact the hospital operator. Locate the [facility] provider you are looking for under [facility]  and page to a number that you can be directly reached. If you still have difficulty reaching the provider, please page the [facility] (Director on Call) for the Hospitalists listed on amion for assistance.

## 2021-04-01 NOTE — Evaluation (Signed)
Occupational Therapy Evaluation Patient Details Name: Allison Duffy MRN: [id number] DOB: 1926/10/08 Today's Date: 04/01/2021   History of Present Illness 88 y.o. female who presented 03/29/21 with fever, malaise, SOB, and chest discomfort. Pt admitted with acute respiratory failure with hypoxia secondary to CAP. PMH: HTN, HLD, chronic diastolic heart failure, afib   Clinical Impression   PTA, pt was living alone and was performing ADLs, IADLs, and functional mobility with cane; reports she has good family support from her daughter and grandson and has someone come in regularly to assist with cleaning home. Pt currently requiring Supervision-Min A for UB ADLs, Min Guard-Min A for LB ADLs, and Min Guard A for functional mobility with RW. Pt presenting with decreased activity tolerance as seen by fatigue and needing 6L O2 during activity. Pt with decreased safety and occupational performance compared to baseline. Pt would benefit from further acute OT to facilitate safe dc. Recommend dc to SNF for further OT to optimize safety, independence with ADLs, and return to PLOF. However, if pt's family able to provide initial support at home, possibly could dc to home with [place].      Recommendations for follow up therapy are one component of a multi-disciplinary discharge planning process, led by the attending physician.  Recommendations may be updated based on patient status, additional functional criteria and insurance authorization.   Follow Up Recommendations  Skilled nursing-short term rehab (<3 hours/day) (May progress to [facility])    Assistance Recommended at Discharge Frequent or constant Supervision/Assistance  Patient can return home with the following A little help with walking and/or transfers;A little help with bathing/dressing/bathroom;Assist for transportation;Assistance with cooking/housework (If able to have initial support, may be able to dc to home with [facility])    Functional Status  Assessment  Patient has had a recent decline in their functional status and demonstrates the ability to make significant improvements in function in a reasonable and predictable amount of time.  Equipment Recommendations  None recommended by OT    Recommendations for Other Services PT consult     Precautions / Restrictions Precautions Precautions: Fall;Other (comment) Precaution Comments: Monitor vitals Restrictions Weight Bearing Restrictions: No      Mobility Bed Mobility Overal bed mobility: Needs Assistance Bed Mobility: Supine to Sit     Supine to sit: Min guard, HOB elevated     General bed mobility comments: MIn GUard A for safety    Transfers Overall transfer level: Needs assistance Equipment used: Rolling walker (2 wheels) Transfers: Sit to/from Stand Sit to Stand: Min guard           General transfer comment: Min Guard A for safety      Balance Overall balance assessment: Needs assistance Sitting-balance support: No upper extremity supported, Feet supported Sitting balance-Leahy Scale: Fair     Standing balance support: No upper extremity supported, During functional activity, Bilateral upper extremity supported Standing balance-Leahy Scale: Fair Standing balance comment: standing at sink                           ADL either performed or assessed with clinical judgement   ADL Overall ADL's : Needs assistance/impaired Eating/Feeding: Supervision/ safety;Set up;Sitting   Grooming: Oral care;Supervision/safety;Set up;Standing Grooming Details (indicate cue type and reason): Pt performing oral care while standing at sink with Supervision-Min Guard A. Pt was going to wash her face and then became fatigued. Noting SOB increased Upper Body Bathing: Supervision/ safety;Set up;Sitting;Minimal assistance Upper Body Bathing Details (  indicate cue type and reason): Min A for washing back while standing at sink. Performing at Supervision level in  sitting Lower Body Bathing: Min guard;Sit to/from stand   Upper Body Dressing : Supervision/safety;Set up;Sitting   Lower Body Dressing: Minimal assistance;Sit to/from stand   Toilet Transfer: Min guard;Ambulation;Rolling walker (2 wheels) (Simulated to recliner)           Functional mobility during ADLs: Min guard;Rolling walker (2 wheels) General ADL Comments: Pt presenting with decreased activity tolerance.     Vision Baseline Vision/History: 1 Wears glasses       Perception     Praxis      Pertinent Vitals/Pain Pain Assessment Pain Assessment: Faces Faces Pain Scale: Hurts little more Pain Location: chest with coughing Pain Descriptors / Indicators: Discomfort, Grimacing Pain Intervention(s): Monitored during session, Repositioned     Hand Dominance Right   Extremity/Trunk Assessment Upper Extremity Assessment Upper Extremity Assessment: Overall WFL for tasks assessed   Lower Extremity Assessment Lower Extremity Assessment: Defer to PT evaluation   Cervical / Trunk Assessment Cervical / Trunk Assessment: Kyphotic   Communication Communication Communication: No difficulties   Cognition Arousal/Alertness: Awake/alert Behavior During Therapy: WFL for tasks assessed/performed Overall Cognitive Status: Within Functional Limits for tasks assessed                                 General Comments: Very motivated     General Comments  SpO2 99-97% on 6L. HR 60-80s. BP stable.    Exercises     Shoulder Instructions      Home Living Family/patient expects to be discharged to:: Private residence Living Arrangements: Alone Available Help at Discharge: Family;Available 24 hours/day Type of Home: House Home Access: Stairs to enter [employer] of Steps: 3 Entrance Stairs-Rails: Right Home Layout: One level     Bathroom Shower/Tub: Teacher, early years/pre: Handicapped height     Home Equipment: Rollator (4  wheels);Tub bench;Grab bars - tub/shower;Other (comment) (Hurrycane)          Prior Functioning/Environment Prior Level of Function : Independent/Modified Independent;Driving             Mobility Comments: Mod I with Hurrycane for mobility ADLs Comments: Performs ADLs, IADLs, and driving. Hiring someone to assist with cleaning recently.        OT Problem List: Decreased strength;Decreased range of motion;Decreased activity tolerance;Impaired balance (sitting and/or standing);Decreased knowledge of use of DME or AE;Decreased knowledge of precautions      OT Treatment/Interventions: Therapeutic exercise;Self-care/ADL training;Energy conservation;DME and/or AE instruction;Therapeutic activities;Patient/family education    OT Goals(Current goals can be found in the care plan section) Acute Rehab OT Goals Patient Stated Goal: Go home OT Goal Formulation: With patient Time For Goal Achievement: 04/15/21 Potential to Achieve Goals: Good  OT Frequency: Min 2X/week    Co-evaluation              AM-PAC OT "6 Clicks" Daily Activity     Outcome Measure Help from another person eating meals?: None Help from another person taking care of personal grooming?: A Little Help from another person toileting, which includes using toliet, bedpan, or urinal?: A Little Help from another person bathing (including washing, rinsing, drying)?: A Little Help from another person to put on and taking off regular upper body clothing?: A Little Help from another person to put on and taking off regular lower body clothing?: A Little 6 Click  Score: 19   End of Session Equipment Utilized During Treatment: Rolling walker (2 wheels);Oxygen Nurse Communication: Mobility status  Activity Tolerance: Patient tolerated treatment well Patient left: in chair;with call bell/phone within reach  OT Visit Diagnosis: Other abnormalities of gait and mobility (R26.89);Unsteadiness on feet (R26.81);Muscle weakness  (generalized) (M62.81)                Time: 7680-8811 OT Time Calculation (min): 33 min Charges:  OT General Charges $OT Visit: 1 Visit OT Evaluation $OT Eval Moderate Complexity: 1 Mod OT Treatments $Self Care/Home Management : 8-22 mins  Rondy Krupinski MSOT, OTR/L Acute Rehab Pager: [phone] Office: [place] 04/01/2021, 8:43 AM

## 2021-04-01 NOTE — NC FL2 (Signed)
?[place] MEDICAID FL2 LEVEL OF CARE SCREENING TOOL  ?  ? ?IDENTIFICATION  ?Patient Name: ?Allison Duffy Birthdate: 03/02/1926 Sex: female Admission Date (Current Location): ?03/29/2021  ?[place] and [place] Number: ? [place] ?  Facility and Address:  ?[facility], [place] [address] [place], [place] [address] ?     Provider Number: ?[id number]  ?Attending Physician Name and Address:  ?Darliss Cheney, MD ? Relative Name and Phone Number:  ?Smith,Connie (Daughter)   [phone] ?   ?Current Level of Care: ?Hospital Recommended Level of Care: ?[facility] Prior Approval Number: ?  ? ?Date Approved/Denied: ?  PASRR Number: ?  ? ?Discharge Plan: ?SNF ?  ? ?Current Diagnoses: ?Patient Active Problem List  ? Diagnosis Date Noted  ? Acute respiratory failure with hypoxia secondary to community acquired pneumonia 03/29/2021  ? Acute respiratory failure with hypoxia ([place]) 03/29/2021  ? Diarrhea 03/29/2021  ? Lactic acidosis 03/29/2021  ? Hypokalemia 03/29/2021  ? Coronary artery disease 04/13/2020  ? Unstable angina ([place]) 04/11/2020  ? Mitral regurgitation 03/01/2019  ? Acute on chronic diastolic CHF (congestive heart failure) ([place]) 01/28/2019  ? Paroxysmal atrial fibrillation ([place]) 01/28/2019  ? Colitis, acute 05/31/2016  ? Primary osteoarthritis of right shoulder 12/27/2014  ? Pulmonary nodule 06/23/2014  ? Essential hypertension 07/05/2013  ? Hyperlipidemia associated with type 2 diabetes mellitus ([place]) 07/05/2013  ? Type 2 diabetes mellitus with complication, without long-term current use of insulin ([place]) 07/05/2013  ? Chest pain CAD 07/05/2013  ? ? ?Orientation RESPIRATION BLADDER Height & Weight   ?  ?  ? O2 Incontinent, External catheter Weight: 169 lb 4.8 oz (76.8 kg) ?Height:  '5\' 5"'$  (165.1 cm)  ?BEHAVIORAL SYMPTOMS/MOOD NEUROLOGICAL BOWEL NUTRITION STATUS  ?    Continent Diet  ?AMBULATORY STATUS COMMUNICATION OF NEEDS Skin   ?Limited Assist Verbally Normal ?  ?  ?  ?    ?     ?      ? ? ?Personal Care Assistance Level of Assistance  ?Bathing, Dressing, Feeding Bathing Assistance: Limited assistance ?Feeding assistance: Independent ?Dressing Assistance: Limited assistance ?   ? ?Functional Limitations Info  ?Sight, Hearing, Speech Sight Info: Adequate ?Hearing Info: Impaired ?Speech Info: Adequate  ? ? ?SPECIAL CARE FACTORS FREQUENCY  ?PT (By licensed PT), OT (By licensed OT)   ?  ?PT Frequency: 5x a week ?OT Frequency: 5x a week ?  ?  ?  ?   ? ? ?Contractures Contractures Info: Not present  ? ? ?Additional Factors Info  ?Code Status, Allergies Code Status Info: Full ?Allergies Info: Codeine, Hydrocodone, Prednisone, Sulfa Antibiotics ?  ?  ?  ?   ? ?Current Medications (04/01/2021):  This is the current hospital active medication list ?Current Facility-Administered Medications  ?Medication Dose Route Frequency Provider Last Rate Last Admin  ? acetaminophen (TYLENOL) tablet 650 mg  650 mg Oral Q6H PRN Fuller Plan A, MD   650 mg at 04/01/21 1443  ? acidophilus (RISAQUAD) capsule 1 capsule  1 capsule Oral QHS Smith, Rondell A, MD   1 capsule at 03/31/21 2153  ? albuterol (PROVENTIL) (2.5 MG/3ML) 0.083% nebulizer solution 2.5 mg  2.5 mg Nebulization Q6H PRN Fuller Plan A, MD   2.5 mg at 03/30/21 0053  ? amLODipine (NORVASC) tablet 10 mg  10 mg Oral QHS Smith, Rondell A, MD   10 mg at 03/31/21 2153  ? apixaban (ELIQUIS) tablet 5 mg  5 mg Oral BID Norval Morton, MD  5 mg at 04/01/21 0930  ? aspirin EC tablet 81 mg  81 mg Oral Daily Fuller Plan A, MD   81 mg at 04/01/21 4944  ? azithromycin (ZITHROMAX) tablet 500 mg  500 mg Oral Daily Pham, Minh Q, RPH-CPP   500 mg at 04/01/21 0930  ? carvedilol (COREG) tablet 12.5 mg  12.5 mg Oral BID WC Smith, Rondell A, MD   12.5 mg at 04/01/21 0930  ? cefTRIAXone (ROCEPHIN) 2 g in sodium chloride 0.9 % 100 mL IVPB  2 g Intravenous Q24H Tamala Julian, Rondell A, MD 200 mL/hr at 04/01/21 0941 2 g at 04/01/21 0941  ? cholestyramine (QUESTRAN) packet 4 g  4 g  Oral Daily PRN Fuller Plan A, MD      ? cloNIDine (CATAPRES) tablet 0.2 mg  0.2 mg Oral Daily Smith, Rondell A, MD   0.2 mg at 04/01/21 0930  ? And  ? cloNIDine (CATAPRES) tablet 0.1 mg  0.1 mg Oral QHS Smith, Rondell A, MD   0.1 mg at 03/31/21 2153  ? fentaNYL (SUBLIMAZE) injection 25 mcg  25 mcg Intravenous Q2H PRN Fuller Plan A, MD   25 mcg at 03/29/21 1628  ? furosemide (LASIX) injection 40 mg  40 mg Intravenous BID Darliss Cheney, MD   40 mg at 04/01/21 0930  ? guaiFENesin (MUCINEX) 12 hr tablet 600 mg  600 mg Oral BID Fuller Plan A, MD   600 mg at 04/01/21 9675  ? hydrALAZINE (APRESOLINE) injection 5 mg  5 mg Intravenous Q4H PRN Fuller Plan A, MD      ? insulin aspart (novoLOG) injection 0-15 Units  0-15 Units Subcutaneous TID WC Norval Morton, MD   5 Units at 04/01/21 1151  ? [START ON 04/02/2021] insulin glargine-yfgn (SEMGLEE) injection 15 Units  15 Units Subcutaneous Daily Pahwani, Ravi, MD      ? irbesartan (AVAPRO) tablet 300 mg  300 mg Oral Daily Fuller Plan A, MD   300 mg at 04/01/21 0929  ? loperamide (IMODIUM) capsule 4 mg  4 mg Oral PRN Fuller Plan A, MD   4 mg at 03/29/21 1328  ? ondansetron (ZOFRAN) tablet 4 mg  4 mg Oral Q6H PRN Fuller Plan A, MD   4 mg at 03/29/21 2351  ? Or  ? ondansetron (ZOFRAN) injection 4 mg  4 mg Intravenous Q6H PRN Fuller Plan A, MD   4 mg at 03/30/21 1652  ? pantoprazole (PROTONIX) EC tablet 40 mg  40 mg Oral Daily Tamala Julian, Rondell A, MD   40 mg at 04/01/21 0930  ? polyvinyl alcohol (LIQUIFILM TEARS) 1.4 % ophthalmic solution 1 drop  1 drop Both Eyes BID Fuller Plan A, MD   1 drop at 04/01/21 0932  ? potassium chloride SA (KLOR-CON M) CR tablet 40 mEq  40 mEq Oral Daily Darliss Cheney, MD   40 mEq at 04/01/21 0929  ? promethazine (PHENERGAN) 12.5 mg in sodium chloride 0.9 % 50 mL IVPB  12.5 mg Intravenous Q6H PRN Darliss Cheney, MD      ? ranolazine (RANEXA) 12 hr tablet 500 mg  500 mg Oral BID Tamala Julian, Rondell A, MD   500 mg at 04/01/21 0930  ?  rosuvastatin (CRESTOR) tablet 20 mg  20 mg Oral QHS Smith, Rondell A, MD   20 mg at 03/31/21 2153  ? ? ? ?Discharge Medications: ?Please see discharge summary for a list of discharge medications. ? ?Relevant Imaging Results: ? ?Relevant Lab Results: ? ? ?Additional Information ?SSN:  [id number] ? ?Halvor Behrend Renold Don, LCSWA ? ? ? ? ?

## 2021-04-01 NOTE — TOC Initial Note (Signed)
Transition of Care (TOC) - Initial/Assessment Note  ? ? ?Patient Details  ?Name: Allison Duffy ?MRN: [id number] ?Date of Birth: 1926/02/23 ? ?Transition of Care (TOC) CM/SW Contact:    ?Reece Agar, LCSWA ?Phone Number: ?04/01/2021, 4:21 PM ? ?Clinical Narrative:                 ?CSW received SNF consult. CSW met with pt at bedside and daughter via phone. CSW introduced self and explained role at the hospital. Pt reports that PTA the pt lived at home alone. PT reports assistance with ADLs. PT reports pt walked 20 feet and has a click score of 17.  ? ?CSW reviewed PT/OT recommendations for SNF. Pt understands that she soul benefit from a SNF bc she lives alone. Pt gave CSW permission to fax out to facilities in the area. Pt has requested [facility] as a priority facility. CSW gave pt medicare.gov rating list to review.  ? ?CSW contacted Tracey at [facility] for [place] stated she will have a bed for pt DC Wednesday and will follow up after she has reviewed pt clinicals.  ?  ?CSW will continue to follow. ?  ? ?Expected Discharge Plan: [place] ?Barriers to Discharge: Continued Medical Work up ? ? ?Patient Goals and CMS Choice ?Patient states their goals for this hospitalization and ongoing recovery are:: Rehab ?CMS Medicare.gov Compare Post Acute Care list provided to:: Patient ?Choice offered to / list presented to : Patient ? ?Expected Discharge Plan and Services ?Expected Discharge Plan: [place] ?In-house Referral: Clinical Social Work ?  ?Post Acute Care Choice: [place] ?Living arrangements for the past 2 months: [place] ?                ?  ?  ?  ?  ?  ?  ?  ?  ?  ?  ? ?Prior Living Arrangements/Services ?Living arrangements for the past 2 months: [place] ?Lives with:: Self ?Patient language and need for interpreter reviewed:: Yes ?Do you feel safe going back to the place where you live?: Yes      ?Need for Family Participation in Patient  Care: Yes (Comment) ?Care giver support system in place?: Yes (comment) ?  ?Criminal Activity/Legal Involvement Pertinent to Current Situation/Hospitalization: No - Comment as needed ? ?Activities of Daily Living ?  ?  ? ?Permission Sought/Granted ?Permission sought to share information with : Facility Sport and exercise psychologist, Family Supports ?Permission granted to share information with : Yes, Verbal Permission Granted ? Share Information with NAME: Smith,Connie (Daughter)   [phone] ? Permission granted to share info w AGENCY: SNF ? Permission granted to share info w Relationship: Smith,Connie (Daughter)   [phone] ? Permission granted to share info w Contact Information: Smith,Connie (Daughter)   [phone] ? ?Emotional Assessment ?Appearance:: Appears stated age ?Attitude/Demeanor/Rapport: Engaged ?Affect (typically observed): Accepting ?Orientation: : Oriented to Situation, Oriented to  Time, Oriented to Place, Oriented to Self ?Alcohol / Substance Use: Not Applicable ?Psych Involvement: No (comment) ? ?Admission diagnosis:  Hypokalemia [E87.6] ?CAP (community acquired pneumonia) [J18.9] ?Acute respiratory failure with hypoxia ([place]) [J96.01] ?Community acquired pneumonia of right middle lobe of lung [J18.9] ?Patient Active Problem List  ? Diagnosis Date Noted  ? Acute respiratory failure with hypoxia secondary to community acquired pneumonia 03/29/2021  ? Acute respiratory failure with hypoxia ([place] [employer] [place]) 03/29/2021  ? Diarrhea 03/29/2021  ? Lactic acidosis 03/29/2021  ? Hypokalemia 03/29/2021  ? Coronary artery disease 04/13/2020  ?  Unstable angina ([place]) 04/11/2020  ? Mitral regurgitation 03/01/2019  ? Acute on chronic diastolic CHF (congestive heart failure) ([place]) 01/28/2019  ? Paroxysmal atrial fibrillation ([place]) 01/28/2019  ? Colitis, acute 05/31/2016  ? Primary osteoarthritis of right shoulder 12/27/2014  ? Pulmonary nodule 06/23/2014  ? Essential hypertension 07/05/2013  ? Hyperlipidemia associated  with type 2 diabetes mellitus ([place]) 07/05/2013  ? Type 2 diabetes mellitus with complication, without long-term current use of insulin ([place]) 07/05/2013  ? Chest pain CAD 07/05/2013  ? ?PCP:  Burnard Bunting, MD ?Pharmacy:   ?[facility] [place], [place] [address] AT [facility] ?[place] [address] ?Phone: [phone] Fax: [phone] ? ?[facility] ?[address] ?[place] [address] ?Phone: [phone] Fax: [phone] ? ? ? ? ?Social Determinants of Health (SDOH) Interventions ?  ? ?Readmission Risk Interventions ?No flowsheet data found. ? ? ?

## 2021-04-01 NOTE — Progress Notes (Signed)
Physical Therapy Treatment ?Patient Details ?Name: Allison Duffy ?MRN: [id number] ?DOB: [birth year] ?Today's Date: 04/01/2021 ? ? ?History of Present Illness 88 y.o. female who presented 03/29/21 with fever, malaise, SOB, and chest discomfort. Pt admitted with acute respiratory failure with hypoxia secondary to CAP. PMH: HTN, HLD, chronic diastolic heart failure, afib ? ?  ?PT Comments  ? ? Pt received in supine, agreeable to therapy session but c/o fatigue so limited to seated/standing exercises and transfer training. Pt needing up to min guard for transfers and pre-gait tasks at bedside with RW support. Emphasis on safe use of Incentive Spirometer, supine/seated/standing BLE exercises and benefits of frequent repositioning/OOB transfers for strength/endurance building. Orthostatics taken and BP stable sitting>standing. Pt continues to benefit from PT services to progress toward functional mobility goals.    ?Recommendations for follow up therapy are one component of a multi-disciplinary discharge planning process, led by the attending physician.  Recommendations may be updated based on patient status, additional functional criteria and insurance authorization. ? ?Follow Up Recommendations ? Skilled nursing-short term rehab (<3 hours/day) (may progress to HHPT if assist available) ?  ?  ?Assistance Recommended at Discharge Frequent or constant Supervision/Assistance  ?Patient can return home with the following A little help with walking and/or transfers;A little help with bathing/dressing/bathroom;Assistance with cooking/housework;Assist for transportation;Help with stairs or ramp for entrance ?  ?Equipment Recommendations ? None recommended by PT  ?  ?Recommendations for Other Services   ? ? ?  ?Precautions / Restrictions Precautions ?Precautions: Fall;Other (comment) ?Precaution Comments: Monitor vitals ?Restrictions ?Weight Bearing Restrictions: No  ?  ? ?Mobility ? Bed Mobility ?Overal bed mobility: Needs  Assistance ?Bed Mobility: Supine to Sit, Sit to Supine ?  ?  ?Supine to sit: Min guard, HOB elevated ?Sit to supine: Supervision ?  ?General bed mobility comments: min guard for transition to EOB, use of bed rails and HOB elevated; Supervision for sit>supine with increased time and HOB flat. ?  ? ?Transfers ?Overall transfer level: Needs assistance ?Equipment used: Rolling walker (2 wheels) ?Transfers: Sit to/from Stand ?Sit to Stand: Min guard ?  ?  ?  ?  ?  ?General transfer comment: Min Guard A for safety from bed<>RW x 8 trials ?  ? ?Ambulation/Gait ?  ?  ?  ?  ?  ?  ?Pre-gait activities: standing hip flexion x10 reps with high knees, sidesteps toward HOB with RW and Min guard ?  ? ? ? ?Modified Rankin (Stroke Patients Only) ?  ? ? ?  ?Balance Overall balance assessment: Needs assistance ?Sitting-balance support: No upper extremity supported, Feet supported ?Sitting balance-Leahy Scale: Fair ?  ?  ?Standing balance support: No upper extremity supported, During functional activity, Bilateral upper extremity supported ?Standing balance-Leahy Scale: Fair ?Standing balance comment: single UE support for BP assessment standing at RW ?  ?  ?  ?  ?  ?  ?  ?  ?  ?  ?  ?  ? ?  ?Cognition Arousal/Alertness: Awake/alert ?Behavior During Therapy: [facility] for tasks assessed/performed ?Overall Cognitive Status: Within Functional Limits for tasks assessed ?  ?  ?  ?  ?  ?  ?  ?  ?  ?  ?  ?  ?  ?  ?  ?  ?General Comments: Very motivated ?  ?  ? ?  ?Exercises Other Exercises ?Other Exercises: Incentive spirometer x10 reps ([phone] mL) encouraged hourly use ?Other Exercises: STS x 8 reps ?Other Exercises: standing BLE AROM: hip flexion x10  reps ? ?  ?General Comments General comments (skin integrity, edema, etc.): SpO2 94-98% on 4.5 O2 [place] with standing exercises ?  ?  ? ?Pertinent Vitals/Pain Pain Assessment ?Pain Assessment: Faces ?Faces Pain Scale: Hurts little more ?Pain Location: chest with coughing ?Pain Descriptors /  Indicators: Discomfort, Grimacing ?Pain Intervention(s): Limited activity within patient's tolerance, Monitored during session, Repositioned  ? ? ? ?PT Goals (current goals can now be found in the care plan section) Acute Rehab PT Goals ?Patient Stated Goal: to get better and go home ?PT Goal Formulation: With patient/family ?Time For Goal Achievement: 04/14/21 ?Progress towards PT goals: Progressing toward goals ? ?  ?Frequency ? ? ? Min 3X/week ? ? ? ?  ?PT Plan Current plan remains appropriate  ? ? ?   ?AM-PAC PT "6 Clicks" Mobility   ?Outcome Measure ? Help needed turning from your back to your side while in a flat bed without using bedrails?: A Little ?Help needed moving from lying on your back to sitting on the side of a flat bed without using bedrails?: A Little ?Help needed moving to and from a bed to a chair (including a wheelchair)?: A Little ?Help needed standing up from a chair using your arms (e.g., wheelchair or bedside chair)?: A Little ?Help needed to walk in hospital room?: A Little ?Help needed climbing 3-5 steps with a railing? : A Lot ?6 Click Score: 17 ? ?  ?End of Session Equipment Utilized During Treatment: Oxygen ?Activity Tolerance: Patient tolerated treatment well;Patient limited by fatigue ?Patient left: in bed;with call bell/phone within reach;with bed alarm set;with family/visitor present;Other (comment) (HOB elevated) ?Nurse Communication: Mobility status;Other (comment) (orthostatics taken, SBP stable) ?PT Visit Diagnosis: Unsteadiness on feet (R26.81);Other abnormalities of gait and mobility (R26.89);Muscle weakness (generalized) (M62.81);Difficulty in walking, not elsewhere classified (R26.2) ?  ? ? ?Time: 4709-6283 ?PT Time Calculation (min) (ACUTE ONLY): 22 min ? ?Charges:  $Therapeutic Exercise: 8-22 mins          ?          ? ?Bernie Ransford P., PTA ?Acute Rehabilitation Services ?Pager: [phone] ?Office: [phone]  ? ? ?Kara Pacer Khamia Stambaugh ?04/01/2021, 6:49 PM ? ?

## 2021-04-02 ENCOUNTER — Other Ambulatory Visit: Payer: Self-pay | Admitting: *Deleted

## 2021-04-02 LAB — GLUCOSE, CAPILLARY
Glucose-Capillary: 225 mg/dL — ABNORMAL HIGH (ref 70–99)
Glucose-Capillary: 227 mg/dL — ABNORMAL HIGH (ref 70–99)
Glucose-Capillary: 151 mg/dL — ABNORMAL HIGH (ref 70–99)
Glucose-Capillary: 233 mg/dL — ABNORMAL HIGH (ref 70–99)

## 2021-04-02 LAB — LEGIONELLA PNEUMOPHILA SEROGP 1 UR AG: L. pneumophila Serogp 1 Ur Ag: NEGATIVE

## 2021-04-02 LAB — BASIC METABOLIC PANEL
Anion gap: 8 (ref 5–15)
BUN: 15 mg/dL (ref 8–23)
CO2: 26 mmol/L (ref 22–32)
Calcium: 7.8 mg/dL — ABNORMAL LOW (ref 8.9–10.3)
Chloride: 101 mmol/L (ref 98–111)
Creatinine, Ser: 1.06 mg/dL — ABNORMAL HIGH (ref 0.44–1.00)
GFR, Estimated: 49 mL/min — ABNORMAL LOW (ref >60)
Glucose, Bld: 217 mg/dL — ABNORMAL HIGH (ref 70–99)
Potassium: 3.4 mmol/L — ABNORMAL LOW (ref 3.5–5.1)
Sodium: 135 mmol/L (ref 135–145)

## 2021-04-02 LAB — MAGNESIUM: Magnesium: 1.7 mg/dL (ref 1.7–2.4)

## 2021-04-02 MED ORDER — INSULIN GLARGINE-YFGN 100 UNIT/ML ~~LOC~~ SOLN
25.0000 [IU] | Freq: Every day | SUBCUTANEOUS | Status: DC
  Administered 2021-04-02 – 2021-04-03 (×2): 25 [IU] via SUBCUTANEOUS
  Filled 2021-04-02 (×2): qty 0.25

## 2021-04-02 NOTE — Patient Outreach (Signed)
[facility]) Care Management ? ?04/02/2021 ? ?[name] ?1926/10/07 ?[id number] ? ?Patient was admitted to the hospital on 03/29/21. Nurse Health Coach will perform case closure and transfer patient to the [facility] Team. [facility] Coordinator informed this nurse of the patient's admission.  ?  ?Plan: ?RN Health Coach Discipline Closure ?Discipline Closure letter sent to PCP  ? ?Emelia Loron RN, BSN ?Nurse Health Coach ?[place] ?[phone] ?[email or web address] ? ? ? ? ?

## 2021-04-02 NOTE — Progress Notes (Signed)
[place] in room with NT. Patient just finished walking with the mobility specialist. Pt sitting in recliner chair. Pt ready to get a bath and pure wick changed. NT and RN bathed pt and changed pure wick. Pt stated that she felt dizzy, light headed, and stated she "just don't feel good today". Heart rate noted to be jumping around from low 40s to mid 50s. Heart rate earlier in day was 60-70s, heart rate started trending down around 1300-1330. RN paged MD, orders for STAT EKG and MD to come to bedside.  ? ?1535 Dr. Doristine Bosworth to bedside with RN. Pt assessed by MD. Dgt at bedside. After MD finished, RN obtained EKG. Pt resting in bed. Informed pt to call RN for any worsening symptoms. WCTM.  ?

## 2021-04-02 NOTE — Plan of Care (Signed)

## 2021-04-02 NOTE — Progress Notes (Signed)
Occupational Therapy Treatment ?Patient Details ?Name: Allison Duffy ?MRN: [id number] ?DOB: 1926-12-01 ?Today's Date: 04/02/2021 ? ? ?History of present illness 88 y.o. female who presented 03/29/21 with fever, malaise, SOB, and chest discomfort. Pt admitted with acute respiratory failure with hypoxia secondary to CAP. PMH: HTN, HLD, chronic diastolic heart failure, afib ?  ?OT comments ? Pt progressing well towards established OT goals and demonstrating increased activity tolerance. Pt able to perform three grooming tasks while standing at sink with Supervision-Min Guard A. Pt performing functional mobility with RW and Min Guard A. Pt starting session on 4L O2 (SpO2 98%) and then progressing to RA while at sink maintaining SpO2 >90%. Discussed home support and family supportive of ST rehab before transition to home. Recommend dc to SNF and will continue to follow acutely as admitted.   ? ?Recommendations for follow up therapy are one component of a multi-disciplinary discharge planning process, led by the attending physician.  Recommendations may be updated based on patient status, additional functional criteria and insurance authorization. ?   ?Follow Up Recommendations ? Skilled nursing-short term rehab (<3 hours/day)  ?  ?Assistance Recommended at Discharge Frequent or constant Supervision/Assistance  ?Patient can return home with the following ? A little help with walking and/or transfers;A little help with bathing/dressing/bathroom;Assist for transportation;Assistance with cooking/housework (If able to have initial support, may be able to dc to home with HHOT. Family wanting SNF) ?  ?Equipment Recommendations ? None recommended by OT  ?  ?Recommendations for Other Services PT consult ? ?  ?Precautions / Restrictions Precautions ?Precautions: Fall;Other (comment) ?Precaution Comments: Monitor vitals ?Restrictions ?Weight Bearing Restrictions: No  ? ? ?  ? ?Mobility Bed Mobility ?  ?  ?  ?  ?  ?  ?  ?  ?   ? ?Transfers ?Overall transfer level: Needs assistance ?Equipment used: Rolling walker (2 wheels) ?Transfers: Sit to/from Stand ?Sit to Stand: Min guard ?  ?  ?  ?  ?  ?General transfer comment: MIn Guard A for safety ?  ?  ?Balance Overall balance assessment: Needs assistance ?Sitting-balance support: No upper extremity supported, Feet supported ?Sitting balance-Leahy Scale: Fair ?  ?  ?Standing balance support: No upper extremity supported, During functional activity, Bilateral upper extremity supported ?Standing balance-Leahy Scale: Fair ?  ?  ?  ?  ?  ?  ?  ?  ?  ?  ?  ?  ?   ? ?ADL either performed or assessed with clinical judgement  ? ?ADL Overall ADL's : Needs assistance/impaired ?  ?  ?Grooming: Oral care;Supervision/safety;Standing;Min guard ?Grooming Details (indicate cue type and reason): Pt performing three grooming tasks at sink with Supervision-Min Guard A demonstrating increased activity tolerance. ?  ?  ?  ?  ?  ?  ?  ?  ?Toilet Transfer: Min guard;Ambulation;Rolling walker (2 wheels) (Simulated to recliner) ?  ?  ?  ?  ?  ?Functional mobility during ADLs: Min guard;Rolling walker (2 wheels) ?General ADL Comments: Pt performing three grooming tasks at sink with Supervision-Min guard A. SpO2 95% on RA with O2 monitor on earlobe. ' ?  ? ?Extremity/Trunk Assessment Upper Extremity Assessment ?Upper Extremity Assessment: Overall WFL for tasks assessed ?  ?Lower Extremity Assessment ?Lower Extremity Assessment: Defer to PT evaluation ?  ?  ?  ? ?Vision   ?  ?  ?Perception   ?  ?Praxis   ?  ? ?Cognition Arousal/Alertness: Awake/alert ?Behavior During Therapy: [facility] for tasks assessed/performed ?Overall Cognitive  Status: Within Functional Limits for tasks assessed ?  ?  ?  ?  ?  ?  ?  ?  ?  ?  ?  ?  ?  ?  ?  ?  ?General Comments: Very motivated ?  ?  ?   ?Exercises   ? ?  ?Shoulder Instructions   ? ? ?  ?General Comments SpO2 95% on RA during grooming at sink. Monitor on earlobe  ? ? ?Pertinent Vitals/  Pain       Pain Assessment ?Pain Assessment: Faces ?Faces Pain Scale: Hurts little more ?Pain Location: chest with coughing ?Pain Descriptors / Indicators: Discomfort, Grimacing ?Pain Intervention(s): Monitored during session, Limited activity within patient's tolerance, Repositioned ? ?Home Living   ?  ?  ?  ?  ?  ?  ?  ?  ?  ?  ?  ?  ?  ?  ?  ?  ?  ?  ? ?  ?Prior Functioning/Environment    ?  ?  ?  ?   ? ?Frequency ? Min 2X/week  ? ? ? ? ?  ?Progress Toward Goals ? ?OT Goals(current goals can now be found in the care plan section) ? Progress towards OT goals: Progressing toward goals ? ?Acute Rehab OT Goals ?OT Goal Formulation: With patient ?Time For Goal Achievement: 04/15/21 ?Potential to Achieve Goals: Good ?ADL Goals ?Pt Will Perform Grooming: with modified independence;standing ?Pt Will Perform Lower Body Dressing: with modified independence;sit to/from stand ?Pt Will Transfer to Toilet: with modified independence;ambulating;regular height toilet ?Pt Will Perform Toileting - Clothing Manipulation and hygiene: with modified independence;sit to/from stand;sitting/lateral leans ?Additional ADL Goal #1: Pt will demonstrate increased activity tolerance to perform three grooming tasks in standing with Supervision  ?Plan Discharge plan remains appropriate   ? ?Co-evaluation ? ? ?   ?  ?  ?  ?  ? ?  ?AM-PAC OT "6 Clicks" Daily Activity     ?Outcome Measure ? ? Help from another person eating meals?: None ?Help from another person taking care of personal grooming?: A Little ?Help from another person toileting, which includes using toliet, bedpan, or urinal?: A Little ?Help from another person bathing (including washing, rinsing, drying)?: A Little ?Help from another person to put on and taking off regular upper body clothing?: A Little ?Help from another person to put on and taking off regular lower body clothing?: A Little ?6 Click Score: 19 ? ?  ?End of Session Equipment Utilized During Treatment: Rolling walker (2  wheels) ? ?OT Visit Diagnosis: Other abnormalities of gait and mobility (R26.89);Unsteadiness on feet (R26.81);Muscle weakness (generalized) (M62.81) ?  ?Activity Tolerance Patient tolerated treatment well ?  ?Patient Left in chair;with call bell/phone within reach;with family/visitor present ?  ?Nurse Communication Mobility status ?  ? ?   ? ?Time: 1219-7588 ?OT Time Calculation (min): 25 min ? ?Charges: OT General Charges ?$OT Visit: 1 Visit ?OT Treatments ?$Self Care/Home Management : 23-37 mins ? ?Denean Pavon MSOT, OTR/L ?Acute Rehab ?Pager: [phone] ?Office: [phone] ? ?Tip Atienza M Kailynne Ferrington ?04/02/2021, 12:52 PM ? ? ?

## 2021-04-02 NOTE — Progress Notes (Signed)
?  Mobility Specialist Criteria Algorithm Info. ? ? ? 04/02/21 1412  ?Mobility  ?Activity Ambulated with assistance in hallway ?(in chair before and afte)  ?Range of Motion/Exercises Active;All extremities  ?Level of Assistance Contact guard assist, steadying assist  ?Assistive Device Front wheel walker  ?Distance Ambulated (ft) 90 ft  ?Activity Response Tolerated well  ? ?Patient received in recliner chair eager to participate. Stood independently and ambulated in hallway min guard with slow gait. During ambulation pt stated she didn't feel well with slight dizziness. VSS. RN notified. LE's buckle x2 during gait trial. SaO2 maintained >90% on RA during session. Returned to room without incident, was left in recliner with all needs met and RN/NT present. ? ?04/02/2021 ?3:17 PM ? ?[name], CMS, BS EXP ?Acute Rehabilitation Services  ?MWUXL:[phone] ?Office: [phone] ? ?

## 2021-04-02 NOTE — Progress Notes (Signed)
PROGRESS NOTE    Allison Duffy  LJQ:[id number] DOB: 04-18-1926 DOA: 03/29/2021 PCP: Burnard Bunting, MD   Brief Narrative:  HPI: Allison Duffy is a 88 y.o. female with medical history significant of hypertension, hyperlipidemia, chronic diastolic heart failure (last EF noted to be 60-65%), atrial fibrillation on chronic anticoagulation who presents with complaints of fever and malaise over the last 2 days.  Normally patient is getting around with use of a cane and is not on oxygen at baseline.  Her daughter is present at bedside and adds that the patient had complained of feeling dizzy 2 days ago.  Patient had recently had a bout of her "colitis" is which daughter describes as huge bouts of diarrhea for which she was placed on cholestyramine by her primary Dr. Reynaldo Minium.  She was feeling so weak that she had not taken the medication.  Daughter noted that she had been using her walker to try and get around.  In addition to this patient complained of some shortness of breath, mildly productive cough, chills, and some chest discomfort.  She had not recently been on antibiotics or previously ever been diagnosed with C. difficile.  Denies any blood in stools, abdominal pain, or episodes of vomiting.   Upon EMS arrival patient's O2 saturations were noted to be 85% on room air placed on 4 L of oxygen with improvement to 95%.  Patient was noted to be febrile up to 103 F and given Tylenol 1 g prior to arrival.   In the ED patient was noted to have temperature of 99.8 F with blood pressures elevated up to 180/49, and O2 saturation currently maintained on 3-4 L nasal cannula oxygen.  Chest x-ray noted concern for right middle lobe pneumonia with minimal left basilar airspace disease concerning for atelectasis.  Patient has been given 1 L lactated Ringer's, Rocephin, azithromycin, and 40 mEq of potassium chloride p.o.  Assessment & Plan:   Active Problems:   Coronary artery disease   Acute respiratory  failure with hypoxia secondary to community acquired pneumonia   Type 2 diabetes mellitus with complication, without long-term current use of insulin (HCC)   Chest pain  CAD   Diarrhea   Hypokalemia   Essential hypertension   Hyperlipidemia associated with type 2 diabetes mellitus (HCC)   Lactic acidosis   Acute on chronic diastolic CHF (congestive heart failure) (HCC)   Paroxysmal atrial fibrillation (HCC)   Acute respiratory failure with hypoxia (HCC)  Acute respiratory failure with hypoxia secondary to community acquired pneumonia as well as acute on chronic diastolic CHF, POA: Patient was initially admitted with only pneumonia.  She was started on IV antibiotics and fluids.  Overnight her respiratory distress got worse and she required much higher amount of oxygen.  Repeat chest x-ray showed vascular congestion.  However on my personal review, even the first chest x-ray shows vascular congestion but this was not revealed by official radiology report.  IV fluids were discontinued.  She received 50 mg of Lasix.  Started on Lasix 40 mg IV twice daily.  Over last 24 hours, she has had good urine output but she is still net -500 cc since admission.  Continues to improve and feels well.  Down to 4 L of oxygen.  Continue to wean.  Continue antibiotics as well as Lasix.  Severe sepsis, not POA: Patient did not meet severe sepsis criteria upon admission however after admission, she developed fever as high as 102.7 and she was tachypneic.  No leukocytosis.  Lactic acid 3.2 along with severe hypoxia with source of infection likely pneumonia, she meets criteria for severe sepsis.  Prior history of CAD now with chest pain/elevated troponin/demand ischemia: Patient had complaint of chest pain.  Slight elevated troponin which are flat and indicate above demand ischemia. Patient with prior history of left heart cath in 03/2020 which noted proximal LAD to mid LAD lesion of 50% stenosis, second diagonal lesion 75%  stenosis, ramus lesion 50% stenosed for which no culprit lesion was identified, and medical management was recommended..  Echo shows normal ejection fraction with no wall motion abnormality.  Troponin slightly elevated but flat.  Hypokalemia: slightly low continue 40 mEq p.o. daily, plus she received extra dose of potassium chloride as well.  Hypomagnesemia: Slightly low, continue daily KCl replacement.  Diarrhea Patient reports recent bout of "colitis" where she has been having large bouts of diarrhea.  No prior history of C. Difficile or recent antibiotics given.  Reports being on cholestyramine for treatment and takes a daily probiotic. -Continue probiotic and cholestyramine   Type 2 diabetes mellitus with complication, without long-term current use of insulin ([place]) On admission glucose elevated up to 283.  Hemoglobin A1c 5.9.  Medication regimen includes metformin 500 mg twice daily with meals, glipizide 10 mg daily, and Januvia 100 mg q. lunch.  Holding all oral hypoglycemic agents.  She is still hyperglycemic with a blood sugar in 200 range.  Will increase Semglee further to 25 units and continue SSI.   Lactic acidosis: Secondary to infection.  Resolved.   Hyperlipidemia associated with type 2 diabetes mellitus ([place]) - Continue Crestor   Essential hypertension On admission blood pressures elevated up to 180/49.  Home blood pressure regimen includes amlodipine 10 mg nightly, Coreg 12.5 mg twice daily, clonidine 0.1-0.2 mg twice daily, olmesartan 40 mg nightly, and Ranexa 500 twice daily mg.  Continue Coreg, amlodipine, clonidine, Ranexa, and pharmacy substitution of irbesartan for olmesartan.  Blood pressure controlled.   Paroxysmal atrial fibrillation ([place]): Rates controlled.  Continue Coreg and Eliquis.   DVT prophylaxis:   Eliquis   Code Status: Full Code, I confirmed once again with the daughter and the patient. Family Communication: Daughter present at bedside.  Plan of care  discussed with patient in length and he/she verbalized understanding and agreed with it.  Status is: Inpatient Remains inpatient appropriate because: Patient very sick needs inpatient management.  Estimated body mass index is 28.32 kg/m as calculated from the following:   Height as of this encounter: '5\' 5"'$  (1.651 m).   Weight as of this encounter: 77.2 kg.    Nutritional Assessment: Body mass index is 28.32 kg/m.Marland Kitchen Seen by dietician.  I agree with the assessment and plan as outlined below: Nutrition Status:        . Skin Assessment: I have examined the patient's skin and I agree with the wound assessment as performed by the wound care RN as outlined below:    Consultants:  None  Procedures:  None  Antimicrobials:  Anti-infectives (From admission, onward)    Start     Dose/Rate Route Frequency Ordered Stop   04/01/21 1000  azithromycin (ZITHROMAX) tablet 500 mg        500 mg Oral Daily 03/31/21 1448 04/02/21 1120   03/30/21 1130  azithromycin (ZITHROMAX) tablet 500 mg  Status:  Discontinued        500 mg Oral Daily 03/30/21 1042 03/30/21 1044   03/30/21 1130  azithromycin (ZITHROMAX) 500 mg in sodium chloride 0.9 %  250 mL IVPB  Status:  Discontinued        500 mg 250 mL/hr over 60 Minutes Intravenous Every 24 hours 03/30/21 1044 03/31/21 1448   03/30/21 1000  cefTRIAXone (ROCEPHIN) 2 g in sodium chloride 0.9 % 100 mL IVPB        2 g 200 mL/hr over 30 Minutes Intravenous Every 24 hours 03/29/21 1249 04/02/21 1151   03/30/21 1000  azithromycin (ZITHROMAX) 500 mg in sodium chloride 0.9 % 250 mL IVPB  Status:  Discontinued        500 mg 250 mL/hr over 60 Minutes Intravenous Every 24 hours 03/29/21 1249 03/30/21 1042   03/29/21 1000  cefTRIAXone (ROCEPHIN) 1 g in sodium chloride 0.9 % 100 mL IVPB        1 g 200 mL/hr over 30 Minutes Intravenous  Once 03/29/21 0953 03/29/21 1107   03/29/21 1000  azithromycin (ZITHROMAX) 500 mg in sodium chloride 0.9 % 250 mL IVPB         500 mg 250 mL/hr over 60 Minutes Intravenous  Once 03/29/21 0953 03/29/21 1227         Subjective: Seen and examined.  She has no complaints.  Breathing is improving.  She has reassured me that she is using incentive spirometry.  I advised her to use more and try to spend most of the day sitting in the chair instead of laying.  Objective: Vitals:   04/02/21 0400 04/02/21 0500 04/02/21 0751 04/02/21 1119  BP: (!) 147/68  108/87 (!) 155/70  Pulse: 63  62 61  Resp: '16  20 11  '$ Temp: 98.7 F (37.1 C)  98.7 F (37.1 C) 97.6 F (36.4 C)  TempSrc: Oral  Oral Oral  SpO2: 95%  92% 92%  Weight:  77.2 kg    Height:        Intake/Output Summary (Last 24 hours) at 04/02/2021 1155 Last data filed at 04/02/2021 1149 Gross per 24 hour  Intake 1593.33 ml  Output 3500 ml  Net -1906.67 ml    Filed Weights   03/31/21 0300 04/01/21 0341 04/02/21 0500  Weight: 78.5 kg 76.8 kg 77.2 kg    Examination:  General exam: Appears calm and comfortable  Respiratory system: Clear to auscultation. Respiratory effort normal. Cardiovascular system: S1 & S2 heard, RRR. No JVD, murmurs, rubs, gallops or clicks. No pedal edema. Gastrointestinal system: Abdomen is nondistended, soft and nontender. No organomegaly or masses felt. Normal bowel sounds heard. Central nervous system: Alert and oriented. No focal neurological deficits. Extremities: Symmetric 5 x 5 power. Skin: No rashes, lesions or ulcers.  Psychiatry: Judgement and insight appear normal. Mood & affect appropriate.   Data Reviewed: I have personally reviewed following labs and imaging studies  CBC: Recent Labs  Lab 03/29/21 0846 03/30/21 0146 03/31/21 0047 04/01/21 0052  WBC 7.1 6.1 5.6 6.0  NEUTROABS 5.4  --  3.9 3.3  HGB 11.8* 10.5* 10.5* 10.6*  HCT 33.2* 29.6* 30.0* 29.9*  MCV 96.2 96.4 96.8 95.8  PLT 174 144* 141* 141*    Basic Metabolic Panel: Recent Labs  Lab 03/29/21 0846 03/30/21 0146 03/31/21 0047 04/01/21 0052  04/02/21 0107  NA 133* 132* 134* 135 135  K 2.9* 3.5 3.5 3.2* 3.4*  CL 100 102 100 101 101  CO2 22 21* '24 24 26  '$ GLUCOSE 283* 205* 268* 172* 217*  BUN '15 16 18 16 15  '$ CREATININE 1.00 1.09* 1.23* 1.17* 1.06*  CALCIUM 8.4* 7.9* 7.8* 7.7* 7.8*  MG  --  1.7  --  1.5* 1.7  PHOS  --  2.9  --   --   --     GFR: Estimated Creatinine Clearance: 34.8 mL/min (A) (by C-G formula based on SCr of 1.06 mg/dL (H)). Liver Function Tests: Recent Labs  Lab 03/29/21 0846  AST 26  ALT 21  ALKPHOS 37*  BILITOT 0.7  PROT 6.3*  ALBUMIN 3.2*    No results for input(s): LIPASE, AMYLASE in the last 168 hours. No results for input(s): AMMONIA in the last 168 hours. Coagulation Profile: No results for input(s): INR, PROTIME in the last 168 hours. Cardiac Enzymes: No results for input(s): CKTOTAL, CKMB, CKMBINDEX, TROPONINI in the last 168 hours. BNP (last 3 results) No results for input(s): PROBNP in the last 8760 hours. HbA1C: No results for input(s): HGBA1C in the last 72 hours.  CBG: Recent Labs  Lab 04/01/21 1112 04/01/21 1646 04/01/21 2114 04/02/21 0642 04/02/21 1133  GLUCAP 282* 285* 266* 225* 227*    Lipid Profile: No results for input(s): CHOL, HDL, LDLCALC, TRIG, CHOLHDL, LDLDIRECT in the last 72 hours.  Thyroid Function Tests: No results for input(s): TSH, T4TOTAL, FREET4, T3FREE, THYROIDAB in the last 72 hours. Anemia Panel: No results for input(s): VITAMINB12, FOLATE, FERRITIN, TIBC, IRON, RETICCTPCT in the last 72 hours.  Sepsis Labs: Recent Labs  Lab 03/29/21 0846 03/29/21 1227 03/30/21 1124 03/30/21 2105  PROCALCITON <0.10  --   --   --   LATICACIDVEN 3.1* 3.2* 1.6 1.1     Recent Results (from the past 240 hour(s))  Resp Panel by RT-PCR (Flu A&B, Covid) Nasopharyngeal Swab     Status: None   Collection Time: 03/29/21  8:41 AM   Specimen: Nasopharyngeal Swab; Nasopharyngeal(NP) swabs in vial transport medium  Result Value Ref Range Status   SARS Coronavirus  2 by RT PCR NEGATIVE NEGATIVE Final    Comment: (NOTE) SARS-CoV-2 target nucleic acids are NOT DETECTED.  The SARS-CoV-2 RNA is generally detectable in upper respiratory specimens during the acute phase of infection. The lowest concentration of SARS-CoV-2 viral copies this assay can detect is 138 copies/mL. A negative result does not preclude SARS-Cov-2 infection and should not be used as the sole basis for treatment or other patient management decisions. A negative result may occur with  improper specimen collection/handling, submission of specimen other than nasopharyngeal swab, presence of viral mutation(s) within the areas targeted by this assay, and inadequate number of viral copies(<138 copies/mL). A negative result must be combined with clinical observations, patient history, and epidemiological information. The expected result is Negative.  Fact Sheet for Patients:  EntrepreneurPulse.com.au  Fact Sheet for Healthcare Providers:  IncredibleEmployment.be  This test is no t yet approved or cleared by the [place] FDA and  has been authorized for detection and/or diagnosis of SARS-CoV-2 by FDA under an Emergency Use Authorization (EUA). This EUA will remain  in effect (meaning this test can be used) for the duration of the COVID-19 declaration under Section 564(b)(1) of the Act, 21 U.S.C.section 360bbb-3(b)(1), unless the authorization is terminated  or revoked sooner.       Influenza A by PCR NEGATIVE NEGATIVE Final   Influenza B by PCR NEGATIVE NEGATIVE Final    Comment: (NOTE) The Xpert Xpress SARS-CoV-2/FLU/RSV plus assay is intended as an aid in the diagnosis of influenza from Nasopharyngeal swab specimens and should not be used as a sole basis for treatment. Nasal washings and aspirates are unacceptable for Xpert Xpress SARS-CoV-2/FLU/RSV testing.  Fact Sheet  for Patients: EntrepreneurPulse.com.au  Fact  Sheet for Healthcare Providers: IncredibleEmployment.be  This test is not yet approved or cleared by the [place] FDA and has been authorized for detection and/or diagnosis of SARS-CoV-2 by FDA under an Emergency Use Authorization (EUA). This EUA will remain in effect (meaning this test can be used) for the duration of the COVID-19 declaration under Section 564(b)(1) of the Act, 21 U.S.C. section 360bbb-3(b)(1), unless the authorization is terminated or revoked.  Performed at [facility], [place] [address], [place], [place] [address]   Blood Culture (routine x 2)     Status: None (Preliminary result)   Collection Time: 03/29/21  8:50 AM   Specimen: BLOOD  Result Value Ref Range Status   Specimen Description BLOOD SITE NOT SPECIFIED  Final   Special Requests   Final    BOTTLES DRAWN AEROBIC AND ANAEROBIC Blood Culture results may not be optimal due to an excessive volume of blood received in culture bottles   Culture   Final    NO GROWTH 3 DAYS Performed at [facility], [place] [address], [place], [place] [address]    Report Status PENDING  Incomplete  Blood Culture (routine x 2)     Status: None (Preliminary result)   Collection Time: 03/29/21  8:55 AM   Specimen: BLOOD  Result Value Ref Range Status   Specimen Description BLOOD SITE NOT SPECIFIED  Final   Special Requests   Final    BOTTLES DRAWN AEROBIC AND ANAEROBIC Blood Culture results may not be optimal due to an excessive volume of blood received in culture bottles   Culture   Final    NO GROWTH 3 DAYS Performed at [facility], [place] [address], [place], [place] [address]    Report Status PENDING  Incomplete  Expectorated Sputum Assessment w Gram Stain, Rflx to Resp Cult     Status: None   Collection Time: 03/30/21 12:50 PM   Specimen: Expectorated Sputum  Result Value Ref Range Status   Specimen Description EXPECTORATED SPUTUM  Final   Special Requests NONE  Final   Sputum  evaluation   Final    THIS SPECIMEN IS ACCEPTABLE FOR SPUTUM CULTURE Performed at [facility], [place] [address], [place], [place] [address]    Report Status 03/30/2021 FINAL  Final  Respiratory (~20 pathogens) panel by PCR     Status: None   Collection Time: 03/30/21 12:50 PM   Specimen: Expectorated Sputum; Respiratory  Result Value Ref Range Status   Adenovirus NOT DETECTED NOT DETECTED Final   Coronavirus 229E NOT DETECTED NOT DETECTED Final    Comment: (NOTE) The Coronavirus on the Respiratory Panel, DOES NOT test for the novel  Coronavirus (2019 nCoV)    Coronavirus HKU1 NOT DETECTED NOT DETECTED Final   Coronavirus NL63 NOT DETECTED NOT DETECTED Final   Coronavirus OC43 NOT DETECTED NOT DETECTED Final   Metapneumovirus NOT DETECTED NOT DETECTED Final   Rhinovirus / Enterovirus NOT DETECTED NOT DETECTED Final   Influenza A NOT DETECTED NOT DETECTED Final   Influenza B NOT DETECTED NOT DETECTED Final   Parainfluenza Virus 1 NOT DETECTED NOT DETECTED Final   Parainfluenza Virus 2 NOT DETECTED NOT DETECTED Final   Parainfluenza Virus 3 NOT DETECTED NOT DETECTED Final   Parainfluenza Virus 4 NOT DETECTED NOT DETECTED Final   Respiratory Syncytial Virus NOT DETECTED NOT DETECTED Final   Bordetella pertussis NOT DETECTED NOT DETECTED Final   Bordetella Parapertussis NOT  DETECTED NOT DETECTED Final   Chlamydophila pneumoniae NOT DETECTED NOT DETECTED Final   Mycoplasma pneumoniae NOT DETECTED NOT DETECTED Final    Comment: Performed at [facility], [place] [address] [place], [place] [address]  Culture, Respiratory w Gram Stain     Status: None   Collection Time: 03/30/21 12:50 PM  Result Value Ref Range Status   Specimen Description EXPECTORATED SPUTUM  Final   Special Requests NONE Reflexed from [id number]  Final   Gram Stain   Final    MODERATE WBC PRESENT,BOTH PMN AND MONONUCLEAR RARE SQUAMOUS EPITHELIAL CELLS PRESENT RARE GRAM POSITIVE COCCI RARE GRAM VARIABLE ROD     Culture   Final    FEW Normal respiratory flora-no Staph aureus or Pseudomonas seen Performed at [facility], [place] [address], [place], [place] [address]    Report Status 04/01/2021 FINAL  Final  MRSA Next Gen by PCR, Nasal     Status: None   Collection Time: 04/01/21 12:56 AM   Specimen: Nasal Mucosa; Nasal Swab  Result Value Ref Range Status   MRSA by PCR Next Gen NOT DETECTED NOT DETECTED Final    Comment: (NOTE) The GeneXpert MRSA Assay (FDA approved for NASAL specimens only), is one component of a comprehensive MRSA colonization surveillance program. It is not intended to diagnose MRSA infection nor to guide or monitor treatment for MRSA infections. Test performance is not FDA approved in patients less than 8 years old. Performed at [facility], [place] [address], [place], [place] [address]       Radiology Studies: No results found.  Scheduled Meds:  acidophilus  1 capsule Oral QHS   amLODipine  10 mg Oral QHS   apixaban  5 mg Oral BID   aspirin EC  81 mg Oral Daily   carvedilol  12.5 mg Oral BID WC   cloNIDine  0.2 mg Oral Daily   And   cloNIDine  0.1 mg Oral QHS   furosemide  40 mg Intravenous BID   guaiFENesin  600 mg Oral BID   insulin aspart  0-15 Units Subcutaneous TID WC   insulin glargine-yfgn  25 Units Subcutaneous Daily   irbesartan  300 mg Oral Daily   pantoprazole  40 mg Oral Daily   polyvinyl alcohol  1 drop Both Eyes BID   potassium chloride  40 mEq Oral Daily   ranolazine  500 mg Oral BID   rosuvastatin  20 mg Oral QHS   Continuous Infusions:  promethazine (PHENERGAN) injection (IM or IVPB)       LOS: 4 days   Time spent: 26 minutes  Darliss Cheney, MD [facility]  04/02/2021, 11:55 AM  Please page via [facility] and do not message via secure chat for urgent patient care matters. Secure chat can be used for non urgent patient care matters.  How to contact the [facility] Attending or Consulting provider [place] or covering provider  during after hours [place], for this patient?  Check the care team in [facility] and look for a) attending/consulting TRH provider listed and b) the [facility] team listed. Page or secure chat [phone]. Log into [email or web address] and use [place]'s universal password to access. If you do not have the password, please contact the hospital operator. Locate the [facility] provider you are looking for under [facility] and page to a number that you can be directly reached. If you still have difficulty reaching the provider, please page the [facility] (Director on  Call) for the Hospitalists listed on amion for assistance.

## 2021-04-02 NOTE — Consult Note (Signed)
[facility] CM Inpatient Consult ? ? ?04/02/2021 ? ?[name] ?[birth year] ?[id number] ? ?[place] [facility] CM) ?  ?Patient chart reviewed with noted high risk score for unplanned readmission. Patient is currently active with [facility] CM RN health coach for chronic disease management services. Per chart review, current recommendation is for SNF.  ? ?Plan: Will continue to follow for progress and update community care coordinator of patient disposition. ? ?Of note, [facility] Care Management services does not replace or interfere with any services that are arranged by inpatient case management or social work.   ? ?Netta Cedars, MSN, RN ?[facility] Liaison ?Mobile Phone [phone]  ?Toll free office [phone]  ? ?

## 2021-04-02 NOTE — TOC Progression Note (Signed)
Transition of Care (TOC) - Progression Note  ? ? ?Patient Details  ?Name: Allison Duffy ?MRN: [id number] ?Date of Birth: 01-May-1926 ? ?Transition of Care (TOC) CM/SW Contact  ?Zaleah Ternes Renold Don, LCSWA ?Phone Number: ?04/02/2021, 1:06 PM ? ?Clinical Narrative:    ?[facility] has accepted pt, CSW contacted pt daughter who would like PTAR at DC. ? ? ?Expected Discharge Plan: [place] ?Barriers to Discharge: Continued Medical Work up ? ?Expected Discharge Plan and Services ?Expected Discharge Plan: [place] ?In-house Referral: Clinical Social Work ?  ?Post Acute Care Choice: [place] ?Living arrangements for the past 2 months: [place] ?                ?  ?  ?  ?  ?  ?  ?  ?  ?  ?  ? ? ?Social Determinants of Health (SDOH) Interventions ?  ? ?Readmission Risk Interventions ?No flowsheet data found. ? ?

## 2021-04-03 DIAGNOSIS — I10 Essential (primary) hypertension: Secondary | ICD-10-CM | POA: Diagnosis not present

## 2021-04-03 DIAGNOSIS — E872 Acidosis, unspecified: Secondary | ICD-10-CM | POA: Diagnosis not present

## 2021-04-03 DIAGNOSIS — R079 Chest pain, unspecified: Secondary | ICD-10-CM | POA: Diagnosis not present

## 2021-04-03 DIAGNOSIS — K589 Irritable bowel syndrome without diarrhea: Secondary | ICD-10-CM | POA: Diagnosis not present

## 2021-04-03 DIAGNOSIS — J9600 Acute respiratory failure, unspecified whether with hypoxia or hypercapnia: Secondary | ICD-10-CM | POA: Diagnosis not present

## 2021-04-03 DIAGNOSIS — J9601 Acute respiratory failure with hypoxia: Secondary | ICD-10-CM | POA: Diagnosis not present

## 2021-04-03 DIAGNOSIS — R609 Edema, unspecified: Secondary | ICD-10-CM | POA: Diagnosis not present

## 2021-04-03 DIAGNOSIS — E785 Hyperlipidemia, unspecified: Secondary | ICD-10-CM | POA: Diagnosis not present

## 2021-04-03 DIAGNOSIS — Z7901 Long term (current) use of anticoagulants: Secondary | ICD-10-CM | POA: Diagnosis not present

## 2021-04-03 DIAGNOSIS — J189 Pneumonia, unspecified organism: Secondary | ICD-10-CM | POA: Diagnosis not present

## 2021-04-03 DIAGNOSIS — E119 Type 2 diabetes mellitus without complications: Secondary | ICD-10-CM | POA: Diagnosis not present

## 2021-04-03 DIAGNOSIS — E1169 Type 2 diabetes mellitus with other specified complication: Secondary | ICD-10-CM | POA: Diagnosis not present

## 2021-04-03 DIAGNOSIS — K580 Irritable bowel syndrome with diarrhea: Secondary | ICD-10-CM | POA: Diagnosis not present

## 2021-04-03 DIAGNOSIS — E876 Hypokalemia: Secondary | ICD-10-CM | POA: Diagnosis not present

## 2021-04-03 DIAGNOSIS — Z7401 Bed confinement status: Secondary | ICD-10-CM | POA: Diagnosis not present

## 2021-04-03 DIAGNOSIS — K219 Gastro-esophageal reflux disease without esophagitis: Secondary | ICD-10-CM | POA: Diagnosis not present

## 2021-04-03 DIAGNOSIS — I5033 Acute on chronic diastolic (congestive) heart failure: Secondary | ICD-10-CM | POA: Diagnosis not present

## 2021-04-03 DIAGNOSIS — I2510 Atherosclerotic heart disease of native coronary artery without angina pectoris: Secondary | ICD-10-CM | POA: Diagnosis not present

## 2021-04-03 DIAGNOSIS — I480 Paroxysmal atrial fibrillation: Secondary | ICD-10-CM | POA: Diagnosis not present

## 2021-04-03 DIAGNOSIS — E118 Type 2 diabetes mellitus with unspecified complications: Secondary | ICD-10-CM | POA: Diagnosis not present

## 2021-04-03 DIAGNOSIS — I509 Heart failure, unspecified: Secondary | ICD-10-CM | POA: Diagnosis not present

## 2021-04-03 LAB — CULTURE, BLOOD (ROUTINE X 2)
Culture: NO GROWTH
Culture: NO GROWTH

## 2021-04-03 LAB — BASIC METABOLIC PANEL
Anion gap: 8 (ref 5–15)
BUN: 18 mg/dL (ref 8–23)
CO2: 28 mmol/L (ref 22–32)
Calcium: 8.3 mg/dL — ABNORMAL LOW (ref 8.9–10.3)
Chloride: 99 mmol/L (ref 98–111)
Creatinine, Ser: 1.28 mg/dL — ABNORMAL HIGH (ref 0.44–1.00)
GFR, Estimated: 39 mL/min — ABNORMAL LOW (ref >60)
Glucose, Bld: 180 mg/dL — ABNORMAL HIGH (ref 70–99)
Potassium: 3.4 mmol/L — ABNORMAL LOW (ref 3.5–5.1)
Sodium: 135 mmol/L (ref 135–145)

## 2021-04-03 LAB — MAGNESIUM: Magnesium: 1.8 mg/dL (ref 1.7–2.4)

## 2021-04-03 LAB — GLUCOSE, CAPILLARY: Glucose-Capillary: 191 mg/dL — ABNORMAL HIGH (ref 70–99)

## 2021-04-03 MED ORDER — FUROSEMIDE 40 MG PO TABS
40.0000 mg | ORAL_TABLET | Freq: Every day | ORAL | Status: DC
Start: 2021-04-03 — End: 2021-04-03
  Administered 2021-04-03: 40 mg via ORAL
  Filled 2021-04-03: qty 1

## 2021-04-03 MED ORDER — POTASSIUM CHLORIDE CRYS ER 20 MEQ PO TBCR: 40.0000 meq | EXTENDED_RELEASE_TABLET | Freq: Once | ORAL | Status: DC

## 2021-04-03 NOTE — Discharge Summary (Addendum)
PatientPhysician Discharge Summary  Allison Duffy DPO:[id number] DOB: 06-23-1926 DOA: 03/29/2021  PCP: Burnard Bunting, MD  Admit date: 03/29/2021 Discharge date: 04/03/2021 30 Day Unplanned Readmission Risk Score    Flowsheet Row ED to Hosp-Admission (Current) from 03/29/2021 in [facility]  30 Day Unplanned Readmission Risk Score (%) 23.87 Filed at 04/03/2021 0801       This score is the patient's risk of an unplanned readmission within 30 days of being discharged (0 -100%). The score is based on dignosis, age, lab data, medications, orders, and past utilization.   Low:  0-14.9   Medium: 15-21.9   High: 22-29.9   Extreme: 30 and above          Admitted From: Home Disposition: SNF  Recommendations for Outpatient Follow-up:  Follow up with PCP in 1-2 weeks Please obtain BMP/CBC in one week Please follow up with your PCP on the following pending results: Unresulted Labs (From admission, onward)    None         Home Health: None Equipment/Devices: None  Discharge Condition: Stable CODE STATUS: Full code Diet recommendation: Low-sodium/cardiac  Subjective: Seen and examined.  She feels much better with no shortness of breath.  She has been off of oxygen since last night.  Brief/Interim Summary: Allison Duffy is a 88 y.o. female with medical history significant of hypertension, hyperlipidemia, chronic diastolic heart failure (last EF noted to be 60-65%), atrial fibrillation on chronic anticoagulation who presented with complaints of fever and malaise over the last 2 days.  Normally patient is getting around with use of a cane and is not on oxygen at baseline.  Patient had recently had a bout of her "colitis" is which daughter describes as huge bouts of diarrhea for which she was placed on cholestyramine by her primary Dr. Reynaldo Minium.  She was feeling so weak that she had not taken the medication.  Daughter noted that she had been using her walker to try and  get around.  In addition to this patient complained of some shortness of breath, mildly productive cough, chills, and some chest discomfort.    Upon EMS arrival patient's O2 saturations were noted to be 85% on room air placed on 4 L of oxygen with improvement to 95%.  Patient was noted to be febrile up to 103 F and given Tylenol 1 g prior to arrival.   In the ED patient was noted to have temperature of 99.8 F with blood pressures elevated up to 180/49, and O2 saturation maintained on 3-4 L nasal cannula oxygen.  Chest x-ray noted concern for right middle lobe pneumonia with minimal left basilar airspace disease concerning for atelectasis.  Patient has been given 1 L lactated Ringer's, Rocephin, azithromycin, and 40 mEq of potassium chloride p.o. patient was admitted under hospitalist service with Acute respiratory failure with hypoxia secondary to community acquired pneumonia as well as acute on chronic diastolic CHF which were present on admission.  She was continued on Rocephin and Zithromax.  Overnight her respiratory distress got worse and she required much higher amount of oxygen.  Repeat chest x-ray showed vascular congestion.  However on my personal review, even the first chest x-ray shows vascular congestion but this was not revealed by official radiology report.  IV fluids were discontinued.  She received 50 mg of Lasix.  Started on Lasix 40 mg IV twice daily.  Ever since, patient has continued to improve with good diuresis and she was finally weaned to room  air last night and has maintained her oxygen saturation over 94%.  I have transitioned her back to her oral Lasix today.  Patient has been assessed by PT OT who recommended SNF and reportedly, the bed is available for her so patient is going to be discharged in stable condition.  Please note that prior to dc, patient desaturated to 81% on RA requring 2-3 L of O2 but did not require any O2 while at rest. She will be needing oxygen with activity  for may be 1-2 days.   Severe sepsis, not POA: Patient did not meet severe sepsis criteria upon admission however after admission, she developed fever as high as 102.7 and she was tachypneic.  No leukocytosis.  Lactic acid 3.2 along with severe hypoxia with source of infection likely pneumonia, she meets criteria for severe sepsis.  Sepsis resolved.  Patient completed antibiotics for pneumonia.   Prior history of CAD now with chest pain/elevated troponin/demand ischemia: Patient had complaint of chest pain.  Slight elevated troponin which are flat and indicate above demand ischemia. Patient with prior history of left heart cath in 03/2020 which noted proximal LAD to mid LAD lesion of 50% stenosis, second diagonal lesion 75% stenosis, ramus lesion 50% stenosed for which no culprit lesion was identified, and medical management was recommended..  Echo shows normal ejection fraction with no wall motion abnormality.  Troponin slightly elevated but flat.   Hypokalemia: She will receive replacement today before discharge.   Hypomagnesemia: Resolved.   Diarrhea Patient reports recent bout of "colitis" where she has been having large bouts of diarrhea.  No prior history of C. Difficile or recent antibiotics given.  Reports being on cholestyramine for treatment and takes a daily probiotic. -Continue probiotic and cholestyramine   Type 2 diabetes mellitus with complication, without long-term current use of insulin ([place]) On admission glucose elevated up to 283.  Hemoglobin A1c 5.9.  Medication regimen includes metformin 500 mg twice daily with meals, glipizide 10 mg daily, and Januvia 100 mg q. lunch.    Lactic acidosis: Secondary to infection.  Resolved.   Hyperlipidemia associated with type 2 diabetes mellitus (HCC) - Continue Crestor   Essential hypertension: Continue home blood pressure regimen includes amlodipine 10 mg nightly, Coreg 12.5 mg twice daily, clonidine 0.1-0.2 mg twice daily, olmesartan 40  mg nightly, and Ranexa 500 twice daily mg.    Paroxysmal atrial fibrillation ([place]): Rates controlled.  Continue Coreg and Eliquis.  Discharge plan was discussed with patient and patient's daughter and they verbalized understanding and agreed with it.  Discharge Diagnoses:  Active Problems:   Coronary artery disease   Acute respiratory failure with hypoxia secondary to community acquired pneumonia   Type 2 diabetes mellitus with complication, without long-term current use of insulin (HCC)   Chest pain  CAD   Diarrhea   Hypokalemia   Essential hypertension   Hyperlipidemia associated with type 2 diabetes mellitus (HCC)   Lactic acidosis   Acute on chronic diastolic CHF (congestive heart failure) (HCC)   Paroxysmal atrial fibrillation ([place])   Acute respiratory failure with hypoxia ([place])    Discharge Instructions   Allergies as of 04/03/2021       Reactions   Codeine Nausea And Vomiting   Hydrocodone    Nausea and vomiting   Prednisone    Other reaction(s): sick on her stomach   Sulfa Antibiotics Itching, Nausea Only, Other (See Comments)        Medication List     TAKE these  medications    amLODipine 10 MG tablet Commonly known as: NORVASC Take 10 mg by mouth at bedtime.   aspirin EC 81 MG tablet Take 1 tablet (81 mg total) by mouth daily. Swallow whole.   carboxymethylcellulose 1 % ophthalmic solution Place 1 drop into both eyes 2 (two) times daily.   carvedilol 12.5 MG tablet Commonly known as: COREG TAKE 1 TABLET (12.5 MG TOTAL) BY MOUTH TWO TIMES DAILY WITH A MEAL. What changed:  how much to take how to take this when to take this   cholecalciferol 25 MCG (1000 UNIT) tablet Commonly known as: VITAMIN D Take 1,000 Units by mouth daily with lunch.   cholestyramine 4 g packet Commonly known as: QUESTRAN Take 4 g by mouth daily as needed (IBS).   cloNIDine 0.1 MG tablet Commonly known as: CATAPRES TAKE 2 TABLETS BY MOUTH EVERY MORNING AND 1 AT  BEDTIME What changed:  how much to take how to take this when to take this additional instructions   CORICIDIN NIGHTTIME COLD-COUGH PO Take 1 tablet by mouth every 6 (six) hours as needed (cough/ cold symptoms).   Eliquis 5 MG Tabs tablet Generic drug: apixaban TAKE 1 TABLET BY MOUTH TWICE DAILY What changed: how much to take   furosemide 40 MG tablet Commonly known as: Lasix Take 1 tablet (40 mg total) by mouth daily. Take 1 additional tablet daily if increased swelling, shortness of breath, or weight gain 5 lbs or more in 2 days What changed: additional instructions   glipiZIDE XL 10 MG 24 hr tablet Generic drug: glipiZIDE Take 10 mg by mouth every morning.   metFORMIN 500 MG tablet Commonly known as: GLUCOPHAGE Take 1 tablet (500 mg total) by mouth 2 (two) times daily with a meal.   multivitamin with minerals Tabs tablet Take 1 tablet by mouth daily with supper.   nitroGLYCERIN 0.4 MG SL tablet Commonly known as: NITROSTAT PLACE 1 TABLET (0.4 MG TOTAL) UNDER THE TONGUE EVERY FIVE MINUTES X 3 DOSES AS NEEDED FOR CHEST PAIN. What changed:  how much to take when to take this   olmesartan 40 MG tablet Commonly known as: BENICAR Take 40 mg by mouth at bedtime.   omeprazole 40 MG capsule Commonly known as: PRILOSEC TAKE 1 CAPSULE (40 MG TOTAL) BY MOUTH DAILY WITH SUPPER. What changed:  how much to take when to take this   OneTouch Ultra test strip Generic drug: glucose blood USE AS DIRECTED TO TEST BLOOD SUGAR TWICE DAILY   onetouch ultrasoft lancets use to check blood sugar twice a day as directed   potassium chloride SA 20 MEQ tablet Commonly known as: KLOR-CON M Take 1 tablet (20 mEq total) by mouth daily.   Pro-biotic Blend Caps Take 1 capsule by mouth at bedtime. Takes OTC probiotic   QC TUMERIC COMPLEX PO Take 500 mg by mouth daily as needed (allergies).   ranolazine 500 MG 12 hr tablet Commonly known as: RANEXA TAKE 1 TABLET BY MOUTH TWICE  DAILY   rosuvastatin 20 MG tablet Commonly known as: CRESTOR TAKE 1 TABLET BY MOUTH EVERY NIGHT AT BEDTIME   sitaGLIPtin 100 MG tablet Commonly known as: JANUVIA Take 100 mg by mouth daily with lunch.        Follow-up Information     Burnard Bunting, MD Follow up in 1 week(s).   Specialty: Internal Medicine Contact information: [place] [address] [phone]         Lorretta Harp, MD .  Specialties: Cardiology, Radiology Contact information: [address] [place] [address] [phone]                Allergies  Allergen Reactions   Codeine Nausea And Vomiting   Hydrocodone     Nausea and vomiting   Prednisone     Other reaction(s): sick on her stomach   Sulfa Antibiotics Itching, Nausea Only and Other (See Comments)    Consultations: None   Procedures/Studies: DG CHEST PORT 1 VIEW  Result Date: 03/31/2021 CLINICAL DATA:  CHF EXAM: PORTABLE CHEST 1 VIEW COMPARISON:  03/30/2021, 04/11/2020 FINDINGS: Bilateral mild interstitial thickening. Superimposed right lower lobe airspace disease. No pleural effusion or pneumothorax. Stable cardiomediastinal silhouette. No acute osseous abnormality. 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 Right shoulder arthroplasty. IMPRESSION: 1. Mild CHF. 2. Right lower lobe airspace disease concerning for pneumonia. Electronically Signed   By: Kathreen Devoid M.D.   On: 03/31/2021 10:19   DG Chest Port 1 View  Result Date: 03/30/2021 CLINICAL DATA:  Acute respiratory distress. EXAM: PORTABLE CHEST 1 VIEW COMPARISON:  03/29/2021. FINDINGS: The heart size and mediastinal contours are stable. The pulmonary vasculature is distended. There is atherosclerotic calcification of the aorta. Interstitial prominence with patchy airspace disease is noted in the lungs bilaterally and increased from the prior exam. No effusion or pneumothorax. Right shoulder arthroplasty changes. Stable  surgical clips are seen at the thoracic inlet on the left. IMPRESSION: 1. Cardiomegaly with pulmonary vascular congestion. 2. Increased interstitial and airspace opacities in the lungs bilaterally, possible edema or infiltrate. Electronically Signed   By: Brett Fairy M.D.   On: 03/30/2021 02:07   DG Chest Port 1 View  Result Date: 03/29/2021 CLINICAL DATA:  Cough and fever.  Hypoxia. EXAM: PORTABLE CHEST 1 VIEW COMPARISON:  Two-view chest x-ray 11/18/2020 FINDINGS: Heart size is normal. Atherosclerotic changes are again noted at the aortic arch. Changes of COPD noted. New right middle lobe airspace disease is present. Minimal airspace opacity is noted at the left base. Right shoulder arthroplasty noted. Advanced degenerative changes present at the left shoulder. IMPRESSION: 1. New right middle lobe airspace disease compatible with pneumonia. 2. Minimal left basilar airspace disease likely reflects atelectasis. 3. Changes of COPD. 4. Aortic atherosclerosis. Electronically Signed   By: San Morelle M.D.   On: 03/29/2021 08:36   ECHOCARDIOGRAM COMPLETE  Result Date: 03/30/2021    ECHOCARDIOGRAM REPORT   Patient Name:   Allison Duffy Date of Exam: 03/30/2021 Medical Rec #:  [id number]        Height:       65.0 in Accession #:    [id number]       Weight:       165.0 lb Date of Birth:  09/17/1926        BSA:          1.823 m Patient Age:    55 years         BP:           119/51 mmHg Patient Gender: F                HR:           58 bpm. Exam Location:  Inpatient Procedure: 2D Echo, Cardiac Doppler and Color Doppler Indications:    CHF-Acute Diastolic D35.70  History:        Patient has prior history of Echocardiogram examinations, most  recent 04/12/2020. CHF, Arrythmias:Atrial Flutter; Risk                 Factors:Hypertension and Diabetes.  Sonographer:    Merrie Roof RDCS Referring Phys: [id number] [place]  1. Left ventricular ejection fraction, by estimation, is 55 to 60%.  The left ventricle has normal function. The left ventricle has no regional wall motion abnormalities. Left ventricular diastolic function could not be evaluated.  2. Right ventricular systolic function is normal. The right ventricular size is normal. There is moderately elevated pulmonary artery systolic pressure. The estimated right ventricular systolic pressure is 29.5 mmHg.  3. Left atrial size was severely dilated.  4. The mitral valve is grossly normal. Mild to moderate mitral valve regurgitation.  5. Tricuspid valve regurgitation is mild to moderate.  6. The aortic valve is tricuspid. Aortic valve regurgitation is not visualized. Aortic valve sclerosis/calcification is present, without any evidence of aortic stenosis. FINDINGS  Left Ventricle: Left ventricular ejection fraction, by estimation, is 55 to 60%. The left ventricle has normal function. The left ventricle has no regional wall motion abnormalities. The left ventricular internal cavity size was normal in size. There is  borderline left ventricular hypertrophy. Left ventricular diastolic function could not be evaluated. Right Ventricle: The right ventricular size is normal. Right vetricular wall thickness was not well visualized. Right ventricular systolic function is normal. There is moderately elevated pulmonary artery systolic pressure. The tricuspid regurgitant velocity is 3.37 m/s, and with an assumed right atrial pressure of 3 mmHg, the estimated right ventricular systolic pressure is 28.4 mmHg. Left Atrium: Left atrial size was severely dilated. Right Atrium: Right atrial size was normal in size. Pericardium: There is no evidence of pericardial effusion. Mitral Valve: The mitral valve is grossly normal. Mild to moderate mitral valve regurgitation. Tricuspid Valve: The tricuspid valve is normal in structure. Tricuspid valve regurgitation is mild to moderate. Aortic Valve: The aortic valve is tricuspid. Aortic valve regurgitation is not visualized.  Aortic valve sclerosis/calcification is present, without any evidence of aortic stenosis. Aortic valve mean gradient measures 6.0 mmHg. Aortic valve peak gradient measures 10.0 mmHg. Aortic valve area, by VTI measures 1.61 cm. Pulmonic Valve: The pulmonic valve was normal in structure. Pulmonic valve regurgitation is not visualized. Aorta: The aortic root and ascending aorta are structurally normal, with no evidence of dilitation. IAS/Shunts: The atrial septum is grossly normal.  LEFT VENTRICLE PLAX 2D LVIDd:         5.50 cm LVIDs:         3.80 cm LV PW:         1.30 cm LV IVS:        1.10 cm LVOT diam:     2.00 cm LV SV:         57 LV SV Index:   31 LVOT Area:     3.14 cm  RIGHT VENTRICLE RV Basal diam:  3.90 cm RV S prime:     10.70 cm/s TAPSE (M-mode): 1.9 cm LEFT ATRIUM              Index        RIGHT ATRIUM           Index LA diam:        5.60 cm  3.07 cm/m   RA Area:     14.20 cm LA Vol (A2C):   120.0 ml 65.83 ml/m  RA Volume:   35.10 ml  19.26 ml/m LA Vol (A4C):   81.8  ml  44.87 ml/m LA Biplane Vol: 105.0 ml 57.60 ml/m  AORTIC VALVE AV Area (Vmax):    1.74 cm AV Area (Vmean):   1.61 cm AV Area (VTI):     1.61 cm AV Vmax:           158.00 cm/s AV Vmean:          112.000 cm/s AV VTI:            0.356 m AV Peak Grad:      10.0 mmHg AV Mean Grad:      6.0 mmHg LVOT Vmax:         87.50 cm/s LVOT Vmean:        57.400 cm/s LVOT VTI:          0.182 m LVOT/AV VTI ratio: 0.51  AORTA Ao Root diam: 2.80 cm MITRAL VALVE                  TRICUSPID VALVE MV Area (PHT): 3.76 cm       TR Peak grad:   45.4 mmHg MV Decel Time: 202 msec       TR Vmax:        337.00 cm/s MR Peak grad:    125.9 mmHg MR Mean grad:    76.0 mmHg    SHUNTS MR Vmax:         561.00 cm/s  Systemic VTI:  0.18 m MR Vmean:        407.0 cm/s   Systemic Diam: 2.00 cm MR PISA:         1.57 cm MR PISA Eff ROA: 10 mm MR PISA Radius:  0.50 cm MV E velocity: 114.00 cm/s MV A velocity: 47.60 cm/s MV E/A ratio:  2.39 Mertie Moores MD Electronically  signed by Mertie Moores MD Signature Date/Time: 03/30/2021/4:09:50 PM    Final      Discharge Exam: Vitals:   04/03/21 0452 04/03/21 0746  BP: (!) 144/61 (!) 165/72  Pulse: 61 62  Resp: 18 17  Temp: 98.3 F (36.8 C) 98.3 F (36.8 C)  SpO2: 92% 92%   Vitals:   04/02/21 2000 04/03/21 0005 04/03/21 0452 04/03/21 0746  BP: (!) 150/59 (!) 150/90 (!) 144/61 (!) 165/72  Pulse: 63 (!) 46 61 62  Resp: '20 14 18 17  '$ Temp: 98.1 F (36.7 C) 98.4 F (36.9 C) 98.3 F (36.8 C) 98.3 F (36.8 C)  TempSrc: Oral Oral Oral Oral  SpO2: 93% 99% 92% 92%  Weight:      Height:        General: Pt is alert, awake, not in acute distress Cardiovascular: RRR, S1/S2 +, no rubs, no gallops Respiratory: CTA bilaterally, no wheezing, no rhonchi Abdominal: Soft, NT, ND, bowel sounds + Extremities: no edema, no cyanosis    The results of significant diagnostics from this hospitalization (including imaging, microbiology, ancillary and laboratory) are listed below for reference.     Microbiology: Recent Results (from the past 240 hour(s))  Resp Panel by RT-PCR (Flu A&B, Covid) Nasopharyngeal Swab     Status: None   Collection Time: 03/29/21  8:41 AM   Specimen: Nasopharyngeal Swab; Nasopharyngeal(NP) swabs in vial transport medium  Result Value Ref Range Status   SARS Coronavirus 2 by RT PCR NEGATIVE NEGATIVE Final    Comment: (NOTE) SARS-CoV-2 target nucleic acids are NOT DETECTED.  The SARS-CoV-2 RNA is generally detectable in upper respiratory specimens during the acute phase of infection. The lowest concentration of SARS-CoV-2 viral  copies this assay can detect is 138 copies/mL. A negative result does not preclude SARS-Cov-2 infection and should not be used as the sole basis for treatment or other patient management decisions. A negative result may occur with  improper specimen collection/handling, submission of specimen other than nasopharyngeal swab, presence of viral mutation(s) within  the areas targeted by this assay, and inadequate number of viral copies(<138 copies/mL). A negative result must be combined with clinical observations, patient history, and epidemiological information. The expected result is Negative.  Fact Sheet for Patients:  EntrepreneurPulse.com.au  Fact Sheet for Healthcare Providers:  IncredibleEmployment.be  This test is no t yet approved or cleared by the [place] FDA and  has been authorized for detection and/or diagnosis of SARS-CoV-2 by FDA under an Emergency Use Authorization (EUA). This EUA will remain  in effect (meaning this test can be used) for the duration of the COVID-19 declaration under Section 564(b)(1) of the Act, 21 U.S.C.section 360bbb-3(b)(1), unless the authorization is terminated  or revoked sooner.       Influenza A by PCR NEGATIVE NEGATIVE Final   Influenza B by PCR NEGATIVE NEGATIVE Final    Comment: (NOTE) The Xpert Xpress SARS-CoV-2/FLU/RSV plus assay is intended as an aid in the diagnosis of influenza from Nasopharyngeal swab specimens and should not be used as a sole basis for treatment. Nasal washings and aspirates are unacceptable for Xpert Xpress SARS-CoV-2/FLU/RSV testing.  Fact Sheet for Patients: EntrepreneurPulse.com.au  Fact Sheet for Healthcare Providers: IncredibleEmployment.be  This test is not yet approved or cleared by the [place] FDA and has been authorized for detection and/or diagnosis of SARS-CoV-2 by FDA under an Emergency Use Authorization (EUA). This EUA will remain in effect (meaning this test can be used) for the duration of the COVID-19 declaration under Section 564(b)(1) of the Act, 21 U.S.C. section 360bbb-3(b)(1), unless the authorization is terminated or revoked.  Performed at [facility], [place] [address] [place], [place] [address]   Blood Culture (routine x 2)     Status: None  (Preliminary result)   Collection Time: 03/29/21  8:50 AM   Specimen: BLOOD  Result Value Ref Range Status   Specimen Description BLOOD SITE NOT SPECIFIED  Final   Special Requests   Final    BOTTLES DRAWN AEROBIC AND ANAEROBIC Blood Culture results may not be optimal due to an excessive volume of blood received in culture bottles   Culture   Final    NO GROWTH 4 DAYS Performed at [facility], [place] [address], [place], [place] [address]    Report Status PENDING  Incomplete  Blood Culture (routine x 2)     Status: None (Preliminary result)   Collection Time: 03/29/21  8:55 AM   Specimen: BLOOD  Result Value Ref Range Status   Specimen Description BLOOD SITE NOT SPECIFIED  Final   Special Requests   Final    BOTTLES DRAWN AEROBIC AND ANAEROBIC Blood Culture results may not be optimal due to an excessive volume of blood received in culture bottles   Culture   Final    NO GROWTH 4 DAYS Performed at [facility], [place] [address], [place], [place] [address]    Report Status PENDING  Incomplete  Expectorated Sputum Assessment w Gram Stain, Rflx to Resp Cult     Status: None   Collection Time: 03/30/21 12:50 PM   Specimen: Expectorated Sputum  Result Value Ref Range Status   Specimen Description EXPECTORATED SPUTUM  Final   Special Requests NONE  Final   Sputum evaluation   Final    THIS SPECIMEN IS ACCEPTABLE FOR SPUTUM CULTURE Performed at [facility], [address] [place], [place] [address]    Report Status 03/30/2021 FINAL  Final  Respiratory (~20 pathogens) panel by PCR     Status: None   Collection Time: 03/30/21 12:50 PM   Specimen: Expectorated Sputum; Respiratory  Result Value Ref Range Status   Adenovirus NOT DETECTED NOT DETECTED Final   Coronavirus 229E NOT DETECTED NOT DETECTED Final    Comment: (NOTE) The Coronavirus on the Respiratory Panel, DOES NOT test for the novel  Coronavirus (2019 nCoV)    Coronavirus HKU1 NOT DETECTED NOT DETECTED Final    Coronavirus NL63 NOT DETECTED NOT DETECTED Final   Coronavirus OC43 NOT DETECTED NOT DETECTED Final   Metapneumovirus NOT DETECTED NOT DETECTED Final   Rhinovirus / Enterovirus NOT DETECTED NOT DETECTED Final   Influenza A NOT DETECTED NOT DETECTED Final   Influenza B NOT DETECTED NOT DETECTED Final   Parainfluenza Virus 1 NOT DETECTED NOT DETECTED Final   Parainfluenza Virus 2 NOT DETECTED NOT DETECTED Final   Parainfluenza Virus 3 NOT DETECTED NOT DETECTED Final   Parainfluenza Virus 4 NOT DETECTED NOT DETECTED Final   Respiratory Syncytial Virus NOT DETECTED NOT DETECTED Final   Bordetella pertussis NOT DETECTED NOT DETECTED Final   Bordetella Parapertussis NOT DETECTED NOT DETECTED Final   Chlamydophila pneumoniae NOT DETECTED NOT DETECTED Final   Mycoplasma pneumoniae NOT DETECTED NOT DETECTED Final    Comment: Performed at [facility], [place]. [address] [place], [place] [address]  Culture, Respiratory w Gram Stain     Status: None   Collection Time: 03/30/21 12:50 PM  Result Value Ref Range Status   Specimen Description EXPECTORATED SPUTUM  Final   Special Requests NONE Reflexed from [id number]  Final   Gram Stain   Final    MODERATE WBC PRESENT,BOTH PMN AND MONONUCLEAR RARE SQUAMOUS EPITHELIAL CELLS PRESENT RARE GRAM POSITIVE COCCI RARE GRAM VARIABLE ROD    Culture   Final    FEW Normal respiratory flora-no Staph aureus or Pseudomonas seen Performed at [facility], [place] [address], [place], [place] [address]    Report Status 04/01/2021 FINAL  Final  MRSA Next Gen by PCR, Nasal     Status: None   Collection Time: 04/01/21 12:56 AM   Specimen: Nasal Mucosa; Nasal Swab  Result Value Ref Range Status   MRSA by PCR Next Gen NOT DETECTED NOT DETECTED Final    Comment: (NOTE) The GeneXpert MRSA Assay (FDA approved for NASAL specimens only), is one component of a comprehensive MRSA colonization surveillance program. It is not intended to diagnose MRSA infection nor to  guide or monitor treatment for MRSA infections. Test performance is not FDA approved in patients less than 53 years old. Performed at [facility], [place] [address], [place],  [address]      Labs: BNP (last 3 results) Recent Labs    11/18/20 1742 03/30/21 0945  BNP 234.0* 086.7*   Basic Metabolic Panel: Recent Labs  Lab 03/30/21 0146 03/31/21 0047 04/01/21 0052 04/02/21 0107 04/03/21 0053  NA 132* 134* 135 135 135  K 3.5 3.5 3.2* 3.4* 3.4*  CL 102 100 101 101 99  CO2 21* '24 24 26 28  '$ GLUCOSE 205* 268* 172* 217* 180*  BUN '16 18 16 15 18  '$ CREATININE 1.09* 1.23*  1.17* 1.06* 1.28*  CALCIUM 7.9* 7.8* 7.7* 7.8* 8.3*  MG 1.7  --  1.5* 1.7 1.8  PHOS 2.9  --   --   --   --    Liver Function Tests: Recent Labs  Lab 03/29/21 0846  AST 26  ALT 21  ALKPHOS 37*  BILITOT 0.7  PROT 6.3*  ALBUMIN 3.2*   No results for input(s): LIPASE, AMYLASE in the last 168 hours. No results for input(s): AMMONIA in the last 168 hours. CBC: Recent Labs  Lab 03/29/21 0846 03/30/21 0146 03/31/21 0047 04/01/21 0052  WBC 7.1 6.1 5.6 6.0  NEUTROABS 5.4  --  3.9 3.3  HGB 11.8* 10.5* 10.5* 10.6*  HCT 33.2* 29.6* 30.0* 29.9*  MCV 96.2 96.4 96.8 95.8  PLT 174 144* 141* 141*   Cardiac Enzymes: No results for input(s): CKTOTAL, CKMB, CKMBINDEX, TROPONINI in the last 168 hours. BNP: Invalid input(s): POCBNP CBG: Recent Labs  Lab 04/02/21 0642 04/02/21 1133 04/02/21 1637 04/02/21 2056 04/03/21 0620  GLUCAP 225* 227* 151* 233* 191*   D-Dimer No results for input(s): DDIMER in the last 72 hours. Hgb A1c No results for input(s): HGBA1C in the last 72 hours. Lipid Profile No results for input(s): CHOL, HDL, LDLCALC, TRIG, CHOLHDL, LDLDIRECT in the last 72 hours. Thyroid function studies No results for input(s): TSH, T4TOTAL, T3FREE, THYROIDAB in the last 72 hours.  Invalid input(s): FREET3 Anemia work up No results for input(s): VITAMINB12, FOLATE, FERRITIN, TIBC,  IRON, RETICCTPCT in the last 72 hours. Urinalysis    Component Value Date/Time   COLORURINE YELLOW 03/29/2021 1001   APPEARANCEUR HAZY (A) 03/29/2021 1001   LABSPEC 1.015 03/29/2021 1001   PHURINE 5.0 03/29/2021 1001   GLUCOSEU 50 (A) 03/29/2021 1001   HGBUR NEGATIVE 03/29/2021 1001   BILIRUBINUR NEGATIVE 03/29/2021 1001   KETONESUR 5 (A) 03/29/2021 1001   PROTEINUR 100 (A) 03/29/2021 1001   UROBILINOGEN 0.2 06/23/2014 1140   NITRITE NEGATIVE 03/29/2021 1001   LEUKOCYTESUR NEGATIVE 03/29/2021 1001   Sepsis Labs Invalid input(s): PROCALCITONIN,  WBC,  LACTICIDVEN Microbiology Recent Results (from the past 240 hour(s))  Resp Panel by RT-PCR (Flu A&B, Covid) Nasopharyngeal Swab     Status: None   Collection Time: 03/29/21  8:41 AM   Specimen: Nasopharyngeal Swab; Nasopharyngeal(NP) swabs in vial transport medium  Result Value Ref Range Status   SARS Coronavirus 2 by RT PCR NEGATIVE NEGATIVE Final    Comment: (NOTE) SARS-CoV-2 target nucleic acids are NOT DETECTED.  The SARS-CoV-2 RNA is generally detectable in upper respiratory specimens during the acute phase of infection. The lowest concentration of SARS-CoV-2 viral copies this assay can detect is 138 copies/mL. A negative result does not preclude SARS-Cov-2 infection and should not be used as the sole basis for treatment or other patient management decisions. A negative result may occur with  improper specimen collection/handling, submission of specimen other than nasopharyngeal swab, presence of viral mutation(s) within the areas targeted by this assay, and inadequate number of viral copies(<138 copies/mL). A negative result must be combined with clinical observations, patient history, and epidemiological information. The expected result is Negative.  Fact Sheet for Patients:  EntrepreneurPulse.com.au  Fact Sheet for Healthcare Providers:  IncredibleEmployment.be  This test is no t  yet approved or cleared by the [place] FDA and  has been authorized for detection and/or diagnosis of SARS-CoV-2 by FDA under an Emergency Use Authorization (EUA). This EUA will remain  in effect (meaning this test can be used) for  the duration of the COVID-19 declaration under Section 564(b)(1) of the Act, 21 U.S.C.section 360bbb-3(b)(1), unless the authorization is terminated  or revoked sooner.       Influenza A by PCR NEGATIVE NEGATIVE Final   Influenza B by PCR NEGATIVE NEGATIVE Final    Comment: (NOTE) The Xpert Xpress SARS-CoV-2/FLU/RSV plus assay is intended as an aid in the diagnosis of influenza from Nasopharyngeal swab specimens and should not be used as a sole basis for treatment. Nasal washings and aspirates are unacceptable for Xpert Xpress SARS-CoV-2/FLU/RSV testing.  Fact Sheet for Patients: EntrepreneurPulse.com.au  Fact Sheet for Healthcare Providers: IncredibleEmployment.be  This test is not yet approved or cleared by the [place] FDA and has been authorized for detection and/or diagnosis of SARS-CoV-2 by FDA under an Emergency Use Authorization (EUA). This EUA will remain in effect (meaning this test can be used) for the duration of the COVID-19 declaration under Section 564(b)(1) of the Act, 21 U.S.C. section 360bbb-3(b)(1), unless the authorization is terminated or revoked.  Performed at [facility], [place] [address] [place], [place] [address]   Blood Culture (routine x 2)     Status: None (Preliminary result)   Collection Time: 03/29/21  8:50 AM   Specimen: BLOOD  Result Value Ref Range Status   Specimen Description BLOOD SITE NOT SPECIFIED  Final   Special Requests   Final    BOTTLES DRAWN AEROBIC AND ANAEROBIC Blood Culture results may not be optimal due to an excessive volume of blood received in culture bottles   Culture   Final    NO GROWTH 4 DAYS Performed at [facility], [place]  [address], [place], [place] [address]    Report Status PENDING  Incomplete  Blood Culture (routine x 2)     Status: None (Preliminary result)   Collection Time: 03/29/21  8:55 AM   Specimen: BLOOD  Result Value Ref Range Status   Specimen Description BLOOD SITE NOT SPECIFIED  Final   Special Requests   Final    BOTTLES DRAWN AEROBIC AND ANAEROBIC Blood Culture results may not be optimal due to an excessive volume of blood received in culture bottles   Culture   Final    NO GROWTH 4 DAYS Performed at [facility], [place] [address] [place], [place] [address]    Report Status PENDING  Incomplete  Expectorated Sputum Assessment w Gram Stain, Rflx to Resp Cult     Status: None   Collection Time: 03/30/21 12:50 PM   Specimen: Expectorated Sputum  Result Value Ref Range Status   Specimen Description EXPECTORATED SPUTUM  Final   Special Requests NONE  Final   Sputum evaluation   Final    THIS SPECIMEN IS ACCEPTABLE FOR SPUTUM CULTURE Performed at [facility], [place] [address], [place],  [address]    Report Status 03/30/2021 FINAL  Final  Respiratory (~20 pathogens) panel by PCR     Status: None   Collection Time: 03/30/21 12:50 PM   Specimen: Expectorated Sputum; Respiratory  Result Value Ref Range Status   Adenovirus NOT DETECTED NOT DETECTED Final   Coronavirus 229E NOT DETECTED NOT DETECTED Final    Comment: (NOTE) The Coronavirus on the Respiratory Panel, DOES NOT test for the novel  Coronavirus (2019 nCoV)    Coronavirus HKU1 NOT DETECTED NOT DETECTED Final   Coronavirus NL63 NOT DETECTED NOT DETECTED Final   Coronavirus OC43 NOT DETECTED NOT DETECTED Final   Metapneumovirus NOT DETECTED  NOT DETECTED Final   Rhinovirus / Enterovirus NOT DETECTED NOT DETECTED Final   Influenza A NOT DETECTED NOT DETECTED Final   Influenza B NOT DETECTED NOT DETECTED Final   Parainfluenza Virus 1 NOT DETECTED NOT DETECTED Final   Parainfluenza Virus 2 NOT DETECTED NOT DETECTED Final    Parainfluenza Virus 3 NOT DETECTED NOT DETECTED Final   Parainfluenza Virus 4 NOT DETECTED NOT DETECTED Final   Respiratory Syncytial Virus NOT DETECTED NOT DETECTED Final   Bordetella pertussis NOT DETECTED NOT DETECTED Final   Bordetella Parapertussis NOT DETECTED NOT DETECTED Final   Chlamydophila pneumoniae NOT DETECTED NOT DETECTED Final   Mycoplasma pneumoniae NOT DETECTED NOT DETECTED Final    Comment: Performed at [facility], [place] [address] [place], [place] [address]  Culture, Respiratory w Gram Stain     Status: None   Collection Time: 03/30/21 12:50 PM  Result Value Ref Range Status   Specimen Description EXPECTORATED SPUTUM  Final   Special Requests NONE Reflexed from [id number]  Final   Gram Stain   Final    MODERATE WBC PRESENT,BOTH PMN AND MONONUCLEAR RARE SQUAMOUS EPITHELIAL CELLS PRESENT RARE GRAM POSITIVE COCCI RARE GRAM VARIABLE ROD    Culture   Final    FEW Normal respiratory flora-no Staph aureus or Pseudomonas seen Performed at [facility], [place] [address] [place], [place] [address]    Report Status 04/01/2021 FINAL  Final  MRSA Next Gen by PCR, Nasal     Status: None   Collection Time: 04/01/21 12:56 AM   Specimen: Nasal Mucosa; Nasal Swab  Result Value Ref Range Status   MRSA by PCR Next Gen NOT DETECTED NOT DETECTED Final    Comment: (NOTE) The GeneXpert MRSA Assay (FDA approved for NASAL specimens only), is one component of a comprehensive MRSA colonization surveillance program. It is not intended to diagnose MRSA infection nor to guide or monitor treatment for MRSA infections. Test performance is not FDA approved in patients less than 33 years old. Performed at [facility], [place] [address], [place], [place] [address]      Time coordinating discharge: Over 30 minutes  SIGNED:   Darliss Cheney, MD  [facility] 04/03/2021, 8:46 AM *Please note that this is a verbal dictation therefore any spelling or grammatical errors are  due to the "[employer]" system interpretation. If 7PM-7AM, please contact night-coverage [email or web address]

## 2021-04-03 NOTE — Plan of Care (Signed)
?  Problem: Education: ?Goal: Knowledge of General Education information will improve ?Description: Including pain rating scale, medication(s)/side effects and non-pharmacologic comfort measures ?04/03/2021 1146 by Eulis Manly, RN ?Outcome: Adequate for Discharge ?04/03/2021 1100 by Eulis Manly, RN ?Outcome: Progressing ?  ?Problem: Health Behavior/Discharge Planning: ?Goal: Ability to manage health-related needs will improve ?Outcome: Adequate for Discharge ?  ?Problem: Clinical Measurements: ?Goal: Ability to maintain clinical measurements within normal limits will improve ?Outcome: Adequate for Discharge ?Goal: Will remain free from infection ?04/03/2021 1146 by Eulis Manly, RN ?Outcome: Adequate for Discharge ?04/03/2021 1100 by Eulis Manly, RN ?Outcome: Progressing ?Goal: Diagnostic test results will improve ?04/03/2021 1146 by Eulis Manly, RN ?Outcome: Adequate for Discharge ?04/03/2021 1100 by Eulis Manly, RN ?Outcome: Progressing ?Goal: Respiratory complications will improve ?04/03/2021 1146 by Eulis Manly, RN ?Outcome: Adequate for Discharge ?04/03/2021 1100 by Eulis Manly, RN ?Outcome: Progressing ?Goal: Cardiovascular complication will be avoided ?Outcome: Adequate for Discharge ?  ?Problem: Skin Integrity: ?Goal: Risk for impaired skin integrity will decrease ?Outcome: Adequate for Discharge ?  ?

## 2021-04-03 NOTE — Plan of Care (Signed)
?  Problem: Education: Goal: Knowledge of General Education information will improve Description: Including pain rating scale, medication(s)/side effects and non-pharmacologic comfort measures Outcome: Progressing   Problem: Health Behavior/Discharge Planning: Goal: Ability to manage health-related needs will improve Outcome: Progressing   Problem: Clinical Measurements: Goal: Ability to maintain clinical measurements within normal limits will improve Outcome: Progressing Goal: Will remain free from infection Outcome: Progressing Goal: Diagnostic test results will improve Outcome: Progressing Goal: Respiratory complications will improve Outcome: Progressing Goal: Cardiovascular complication will be avoided Outcome: Progressing   Problem: Skin Integrity: Goal: Risk for impaired skin integrity will decrease Outcome: Progressing   

## 2021-04-03 NOTE — TOC Transition Note (Signed)
Transition of Care (TOC) - CM/SW Discharge Note ? ? ?Patient Details  ?Name: Allison Duffy ?MRN: [id number] ?Date of Birth: 02/13/1926 ? ?Transition of Care (TOC) CM/SW Contact:  ?Reece Agar, LCSWA ?Phone Number: ?04/03/2021, 10:05 AM ? ? ?Clinical Narrative:    ?Patient will DC to: [facility] ?Anticipated DC date: 04/03/2021 ?Family notified: Pt Daughter ?Transport by: Corey Harold ? ? ?Per MD patient ready for DC to [facility] room 210. RN to call report prior to discharge [phone]). RN, patient, patient's family, and facility notified of DC. Discharge Summary and FL2 sent to facility. DC packet on chart. Ambulance transport requested for patient.  ? ?CSW will sign off for now as social work intervention is no longer needed. Please consult [place] again if new needs arise. ? ? ? ? ?Final next level of care: [place] ?Barriers to Discharge: Continued Medical Work up ? ? ?Patient Goals and CMS Choice ?Patient states their goals for this hospitalization and ongoing recovery are:: Rehab ?CMS Medicare.gov Compare Post Acute Care list provided to:: Patient ?Choice offered to / list presented to : Patient ? ?Discharge Placement ?  ?           ?  ?  ?  ?  ? ?Discharge Plan and Services ?In-house Referral: Clinical Social Work ?  ?Post Acute Care Choice: [place]          ?  ?  ?  ?  ?  ?  ?  ?  ?  ?  ? ?Social Determinants of Health (SDOH) Interventions ?  ? ? ?Readmission Risk Interventions ?No flowsheet data found. ? ? ? ? ?

## 2021-04-03 NOTE — Progress Notes (Signed)
SATURATION QUALIFICATIONS: (This note is used to comply with regulatory documentation for home oxygen) ? ?Patient Saturations on Room Air at Rest = 90% ? ?Patient Saturations on Room Air while Ambulating = 81% ? ?Patient Saturations on 3 Liters of oxygen while Ambulating = 98% ? ?Please briefly explain why patient needs home oxygen: To maintain oxygen saturations > 90% when ambulating. ? ?Wyona Almas, PT, DPT ?Acute Rehabilitation Services ?Pager [phone] ?Office [phone] ? ?

## 2021-04-03 NOTE — Progress Notes (Signed)
Physical Therapy Treatment ?Patient Details ?Name: Allison Duffy ?MRN: [id number] ?DOB: January 16, 1927 ?Today's Date: 04/03/2021 ? ? ?History of Present Illness 88 y.o. female who presented 03/29/21 with fever, malaise, SOB, and chest discomfort. Pt admitted with acute respiratory failure with hypoxia secondary to CAP. PMH: HTN, HLD, chronic diastolic heart failure, afib ? ?  ?PT Comments  ? ? Pt progressing towards her physical therapy goals and is very motivated to participate. Pt requiring min assist for bed mobility/transfers and ambulating 80 feet with a Rollator at a min guard assist level. Requires 2-3L O2 to maintain sats > 90% during ambulation. Pt continues with generalized weakness, impaired balance, decreased cardiopulmonary endurance. Continue to recommend SNF for ongoing Physical Therapy.   ?   ?Recommendations for follow up therapy are one component of a multi-disciplinary discharge planning process, led by the attending physician.  Recommendations may be updated based on patient status, additional functional criteria and insurance authorization. ? ?Follow Up Recommendations ? Skilled nursing-short term rehab (<3 hours/day) ?  ?  ?Assistance Recommended at Discharge Frequent or constant Supervision/Assistance  ?Patient can return home with the following A little help with walking and/or transfers;A little help with bathing/dressing/bathroom;Assistance with cooking/housework;Assist for transportation;Help with stairs or ramp for entrance ?  ?Equipment Recommendations ? None recommended by PT  ?  ?Recommendations for Other Services   ? ? ?  ?Precautions / Restrictions Precautions ?Precautions: Fall;Other (comment) ?Precaution Comments: watch O2 ?Restrictions ?Weight Bearing Restrictions: No  ?  ? ?Mobility ? Bed Mobility ?Overal bed mobility: Needs Assistance ?Bed Mobility: Supine to Sit, Sit to Supine ?  ?  ?Supine to sit: Supervision ?Sit to supine: Min assist ?  ?General bed mobility comments: MinA for LE  assist back into bed ?  ? ?Transfers ?Overall transfer level: Needs assistance ?Equipment used: Rolling walker (2 wheels) ?Transfers: Sit to/from Stand ?Sit to Stand: Min assist ?  ?  ?  ?  ?  ?General transfer comment: Up to minA from lower surface of toilet ?  ? ?Ambulation/Gait ?Ambulation/Gait assistance: Min guard ?Gait Distance (Feet): 120 Feet ?Assistive device: Rollator (4 wheels) ?Gait Pattern/deviations: Step-through pattern, Decreased stride length, Trunk flexed ?Gait velocity: decreased ?Gait velocity interpretation: <1.8 ft/sec, indicate of risk for recurrent falls ?  ?General Gait Details: Slower speed with fatigue with decreased bilateral foot clearance, min guard assist for balance, increased trunk/hip flexion ? ? ?Stairs ?  ?  ?  ?  ?  ? ? ?Wheelchair Mobility ?  ? ?Modified Rankin (Stroke Patients Only) ?  ? ? ?  ?Balance Overall balance assessment: Needs assistance ?Sitting-balance support: No upper extremity supported, Feet supported ?Sitting balance-Leahy Scale: Fair ?  ?  ?Standing balance support: During functional activity, Single extremity supported ?Standing balance-Leahy Scale: Poor ?  ?  ?  ?  ?  ?  ?  ?  ?  ?  ?  ?  ?  ? ?  ?Cognition Arousal/Alertness: Awake/alert ?Behavior During Therapy: [facility] for tasks assessed/performed ?Overall Cognitive Status: Within Functional Limits for tasks assessed ?  ?  ?  ?  ?  ?  ?  ?  ?  ?  ?  ?  ?  ?  ?  ?  ?  ?  ?  ? ?  ?Exercises   ? ?  ?General Comments   ?  ?  ? ?Pertinent Vitals/Pain Pain Assessment ?Pain Assessment: Faces ?Faces Pain Scale: No hurt  ? ? ?Home Living   ?  ?  ?  ?  ?  ?  ?  ?  ?  ?   ?  ?  Prior Function    ?  ?  ?   ? ?PT Goals (current goals can now be found in the care plan section) Acute Rehab PT Goals ?Patient Stated Goal: to get better and go home ?Potential to Achieve Goals: Good ?Progress towards PT goals: Progressing toward goals ? ?  ?Frequency ? ? ? Min 3X/week ? ? ? ?  ?PT Plan Current plan remains appropriate   ? ? ?Co-evaluation   ?  ?  ?  ?  ? ?  ?AM-PAC PT "6 Clicks" Mobility   ?Outcome Measure ? Help needed turning from your back to your side while in a flat bed without using bedrails?: None ?Help needed moving from lying on your back to sitting on the side of a flat bed without using bedrails?: A Little ?Help needed moving to and from a bed to a chair (including a wheelchair)?: A Little ?Help needed standing up from a chair using your arms (e.g., wheelchair or bedside chair)?: A Little ?Help needed to walk in hospital room?: A Little ?Help needed climbing 3-5 steps with a railing? : A Lot ?6 Click Score: 18 ? ?  ?End of Session Equipment Utilized During Treatment: Oxygen ?Activity Tolerance: Patient tolerated treatment well ?Patient left: in bed;with call bell/phone within reach ?Nurse Communication: Mobility status ?PT Visit Diagnosis: Unsteadiness on feet (R26.81);Other abnormalities of gait and mobility (R26.89);Muscle weakness (generalized) (M62.81);Difficulty in walking, not elsewhere classified (R26.2) ?  ? ? ?Time: 1751-0258 ?PT Time Calculation (min) (ACUTE ONLY): 34 min ? ?Charges:  $Therapeutic Activity: 23-37 mins          ?          ? ?Wyona Almas, PT, DPT ?Acute Rehabilitation Services ?Pager [phone] ?Office [phone] ? ? ? ?Allison Duffy ?04/03/2021, 11:20 AM ? ?

## 2021-04-03 NOTE — Progress Notes (Signed)
RN called report to [facility] given to [place]. Pt belongings taken by daughter, IV removed. Pt VSS at discharge. Patient being transported [facility] by [facility]. Copy of d/c summary and AVS sent in packet. ?

## 2021-04-03 NOTE — Plan of Care (Signed)
  Problem: Education: Goal: Knowledge of General Education information will improve Description: Including pain rating scale, medication(s)/side effects and non-pharmacologic comfort measures Outcome: Progressing   Problem: Clinical Measurements: Goal: Will remain free from infection Outcome: Progressing Goal: Diagnostic test results will improve Outcome: Progressing Goal: Respiratory complications will improve Outcome: Progressing   

## 2021-04-04 DIAGNOSIS — K219 Gastro-esophageal reflux disease without esophagitis: Secondary | ICD-10-CM | POA: Diagnosis not present

## 2021-04-04 DIAGNOSIS — J9600 Acute respiratory failure, unspecified whether with hypoxia or hypercapnia: Secondary | ICD-10-CM | POA: Diagnosis not present

## 2021-04-04 DIAGNOSIS — E119 Type 2 diabetes mellitus without complications: Secondary | ICD-10-CM | POA: Diagnosis not present

## 2021-04-04 DIAGNOSIS — I10 Essential (primary) hypertension: Secondary | ICD-10-CM | POA: Diagnosis not present

## 2021-04-04 DIAGNOSIS — J189 Pneumonia, unspecified organism: Secondary | ICD-10-CM | POA: Diagnosis not present

## 2021-04-04 DIAGNOSIS — K589 Irritable bowel syndrome without diarrhea: Secondary | ICD-10-CM | POA: Diagnosis not present

## 2021-04-11 ENCOUNTER — Encounter: Payer: Self-pay | Admitting: Cardiovascular Disease

## 2021-04-11 ENCOUNTER — Other Ambulatory Visit: Payer: Self-pay

## 2021-04-11 ENCOUNTER — Ambulatory Visit (INDEPENDENT_AMBULATORY_CARE_PROVIDER_SITE_OTHER): Payer: Medicare Other | Admitting: Cardiovascular Disease

## 2021-04-11 DIAGNOSIS — I10 Essential (primary) hypertension: Secondary | ICD-10-CM | POA: Diagnosis not present

## 2021-04-11 DIAGNOSIS — E785 Hyperlipidemia, unspecified: Secondary | ICD-10-CM | POA: Diagnosis not present

## 2021-04-11 DIAGNOSIS — I480 Paroxysmal atrial fibrillation: Secondary | ICD-10-CM | POA: Diagnosis not present

## 2021-04-11 DIAGNOSIS — E1169 Type 2 diabetes mellitus with other specified complication: Secondary | ICD-10-CM | POA: Diagnosis not present

## 2021-04-11 DIAGNOSIS — I5033 Acute on chronic diastolic (congestive) heart failure: Secondary | ICD-10-CM | POA: Diagnosis not present

## 2021-04-11 MED ORDER — FUROSEMIDE 40 MG PO TABS
ORAL_TABLET | ORAL | 3 refills | Status: DC
Start: 2021-04-11 — End: 2022-02-07

## 2021-04-11 NOTE — Assessment & Plan Note (Signed)
History of acute on chronic diastolic heart failure with recent admission 03/28/2021 with community-acquired pneumonia and superimposed diastolic heart failure requiring diuresis.  Her 2D echocardiogram performed 03/30/2021 revealed normal LV systolic function, moderate elevated pulmonary artery pressure at approximately 50 with mild to moderate MR and mild to moderate TR.  Diastolic parameters could not be evaluated. ?

## 2021-04-11 NOTE — Assessment & Plan Note (Signed)
History of hyperlipidemia on statin therapy with lipid profile performed 04/12/2020 revealing total cholesterol 124, LDL 56 and HDL 37. 

## 2021-04-11 NOTE — Assessment & Plan Note (Signed)
History of essential hypertension a blood pressure measured today at 140/60.  She is on carvedilol, clonidine, and Benicar. ?

## 2021-04-11 NOTE — Patient Instructions (Addendum)
Medication Instructions:  ? ?-Add furosemide (lasix) '20mg'$  in the afternoon.  ? ? ?*If you need a refill on your cardiac medications before your next appointment, please call your pharmacy* ? ? ?Lab Work: ?Your physician recommends that you return for lab work in: 7-10 days for BMET ? ?If you have labs (blood work) drawn today and your tests are completely normal, you will receive your results only by: ?MyChart Message (if you have MyChart) OR ?A paper copy in the mail ?If you have any lab test that is abnormal or we need to change your treatment, we will call you to review the results. ? ? ?Follow-Up: ?At [facility], you and your health needs are our priority.  As part of our continuing mission to provide you with exceptional heart care, we have created designated Provider Care Teams.  These Care Teams include your primary Cardiologist (physician) and Advanced Practice Providers (APPs -  Physician Assistants and Nurse Practitioners) who all work together to provide you with the care you need, when you need it. ? ?We recommend signing up for the patient portal called "MyChart".  Sign up information is provided on this After Visit Summary.  MyChart is used to connect with patients for Virtual Visits (Telemedicine).  Patients are able to view lab/test results, encounter notes, upcoming appointments, etc.  Non-urgent messages can be sent to your provider as well.   ?To learn more about what you can do with MyChart, go to [email or web address].   ? ?Your next appointment:   ?1 month(s) ? ?The format for your next appointment:   ?In Person ? ?Provider:   ?Coletta Memos, FNP, Fabian Sharp, PA-C, Sande Rives, PA-C, Caron Presume, PA-C, Jory Sims, DNP, ANP, Almyra Deforest, PA-C, or Diona Browner, NP     ? ?Then, Quay Burow, MD will plan to see you again in 6 month(s). ? ?

## 2021-04-11 NOTE — Assessment & Plan Note (Signed)
History of PAF maintaining sinus rhythm on Eliquis oral anticoagulation. 

## 2021-04-11 NOTE — Progress Notes (Signed)
? ? ? ?04/11/2021 ?Allison Duffy   ?1926/10/28  ?[id number] ? ?Primary Physician Burnard Bunting, MD ?Primary Cardiologist: Lorretta Harp MD Allison Duffy, [place] ? ?HPI:  Allison Duffy is a 88 y.o.  mildly overweight widowed Caucasian female mother of 3 living daughters (one deceased), grandmother a grandchildren. Her daughter Allison Duffy accompanies her today who I have seen as a patient remotely as well.  I last saw her in the office 01/08/2021.Marland Kitchen  She was referred by Dr. Reynaldo Minium for cardiovascular evaluation because of new onset effort angina or shortness of breath and fatigue.her cardiac risk factor profile is notable for treated hypertension, diabetes and hyperlipidemia. She does not smoke. There is no family history. She has never had a heart attack or stroke. Over the last 6 months she's noticed new onset effort angina, dyspnea and fatigue. The symptoms do not occur at rest. They do not awaken her from sleep she had a 2D echo performed 07/20/2013 with cyst which was essentially normal as was a Myoview stress test back in. ?  ? She was admitted to the hospital 01/27/2019 with combined systolic and diastolic heart failure, atrial fibrillation.  She was diuresed, rate controlled and placed on oral anticoagulation.  She still complains of some dyspnea on exertion.  She weighs herself on a daily basis and avoid salt.  2D echo was performed that showed preserved LV function with moderate to severe mitral vegetation. ?  ?  She performs her activities of daily living with minimal limitation.  She does live alone.  She still drives short distances and does her daily housework.  She gets occasionally short of breath but not so much that it affects her quality of life. ?  ?I did get a coronary CTA on her 04/11/2020 revealing a coronary calcium score 2614.  Based on this I performed cardiac catheterization on her 04/12/2020 revealing moderate segmental proximal LAD disease which I did not think was obstructive and  recommended medical therapy.  She really denies chest pain. ? ?She was admitted to the hospital on 03/29/2021 with community-acquired pneumonia, sepsis and diastolic heart failure.  She was treated with antibiotics and diuresis.  Prior to that she did have a flareup of her colitis.  She is currently in a skilled nursing facility.  She is mildly volume overloaded with 1+ pitting edema.  Her daughter Allison Duffy does relate that the food is high in sodium. ? ? ?Current Meds  ?Medication Sig  ? amLODipine (NORVASC) 10 MG tablet Take 10 mg by mouth at bedtime.  ? aspirin EC 81 MG tablet Take 1 tablet (81 mg total) by mouth daily. Swallow whole.  ? carboxymethylcellulose 1 % ophthalmic solution Place 1 drop into both eyes 2 (two) times daily.  ? carvedilol (COREG) 12.5 MG tablet TAKE 1 TABLET (12.5 MG TOTAL) BY MOUTH TWO TIMES DAILY WITH A MEAL. (Patient taking differently: Take 12.5 mg by mouth 2 (two) times daily with a meal.)  ? cholecalciferol (VITAMIN D) 1000 UNITS tablet Take 1,000 Units by mouth daily with lunch.  ? cholestyramine (QUESTRAN) 4 g packet Take 4 g by mouth daily as needed (IBS).  ? cloNIDine (CATAPRES) 0.1 MG tablet TAKE 2 TABLETS BY MOUTH EVERY MORNING AND 1 AT BEDTIME (Patient taking differently: Take 0.1-0.2 mg by mouth See admin instructions. Take 1 tablet 0.1 mg in the AM and 2 tabs 0.2 mg with evening meal.)  ? ELIQUIS 5 MG TABS tablet TAKE 1 TABLET BY MOUTH TWICE DAILY (Patient taking  differently: Take 5 mg by mouth 2 (two) times daily.)  ? GLIPIZIDE XL 10 MG 24 hr tablet Take 10 mg by mouth every morning.  ? Lancets (ONETOUCH ULTRASOFT) lancets use to check blood sugar twice a day as directed  ? metFORMIN (GLUCOPHAGE) 500 MG tablet Take 1 tablet (500 mg total) by mouth 2 (two) times daily with a meal.  ? Multiple Vitamin (MULTIVITAMIN WITH MINERALS) TABS Take 1 tablet by mouth daily with supper.  ? nitroGLYCERIN (NITROSTAT) 0.4 MG SL tablet PLACE 1 TABLET (0.4 MG TOTAL) UNDER THE TONGUE EVERY FIVE  MINUTES X 3 DOSES AS NEEDED FOR CHEST PAIN. (Patient taking differently: Place 0.4 mg under the tongue every 5 (five) minutes as needed for chest pain.)  ? olmesartan (BENICAR) 40 MG tablet Take 40 mg by mouth at bedtime.  ? omeprazole (PRILOSEC) 40 MG capsule TAKE 1 CAPSULE (40 MG TOTAL) BY MOUTH DAILY WITH SUPPER. (Patient taking differently: 40 mg daily.)  ? ONETOUCH ULTRA test strip USE AS DIRECTED TO TEST BLOOD SUGAR TWICE DAILY  ? potassium chloride SA (KLOR-CON) 20 MEQ tablet Take 1 tablet (20 mEq total) by mouth daily.  ? ranolazine (RANEXA) 500 MG 12 hr tablet TAKE 1 TABLET BY MOUTH TWICE DAILY (Patient taking differently: Take 500 mg by mouth 2 (two) times daily.)  ? rosuvastatin (CRESTOR) 20 MG tablet TAKE 1 TABLET BY MOUTH EVERY NIGHT AT BEDTIME (Patient taking differently: Take 20 mg by mouth at bedtime.)  ? sitaGLIPtin (JANUVIA) 100 MG tablet Take 100 mg by mouth daily with lunch.  ? [DISCONTINUED] furosemide (LASIX) 40 MG tablet Take 1 tablet (40 mg total) by mouth daily. Take 1 additional tablet daily if increased swelling, shortness of breath, or weight gain 5 lbs or more in 2 days (Patient taking differently: Take 40 mg by mouth daily.)  ?  ? ?Allergies  ?Allergen Reactions  ? Codeine Nausea And Vomiting  ? Hydrocodone   ?  Nausea and vomiting  ? Prednisone   ?  Other reaction(s): sick on her stomach  ? Sulfa Antibiotics Itching, Nausea Only and Other (See Comments)  ? ? ?Social History  ? ?Socioeconomic History  ? Marital status: Widowed  ?  Spouse name: 4 children and 3 living  ? Number of children: 4  ? Years of education: Not on file  ? Highest education level: Not on file  ?Occupational History  ? Occupation: Retired  ?Tobacco Use  ? Smoking status: Former  ?  Years: 5.00  ?  Types: Cigarettes  ?  Quit date: 07/05/1953  ?  Years since quitting: 67.8  ? Smokeless tobacco: Never  ?Vaping Use  ? Vaping Use: Never used  ?Substance and Sexual Activity  ? Alcohol use: No  ? Drug use: No  ? Sexual  activity: Never  ?Other Topics Concern  ? Not on file  ?Social History Narrative  ? Not on file  ? ?Social Determinants of Health  ? ?Financial Resource Strain: Not on file  ?Food Insecurity: No Food Insecurity  ? Worried About Charity fundraiser in the Last Year: Never true  ? Ran Out of Food in the Last Year: Never true  ?Transportation Needs: No Transportation Needs  ? Lack of Transportation (Medical): No  ? Lack of Transportation (Non-Medical): No  ?Physical Activity: Not on file  ?Stress: Not on file  ?Social Connections: Not on file  ?Intimate Partner Violence: Not on file  ?  ? ?Review of Systems: ?General: negative for chills,  fever, night sweats or weight changes.  ?Cardiovascular: negative for chest pain, dyspnea on exertion, edema, orthopnea, palpitations, paroxysmal nocturnal dyspnea or shortness of breath ?Dermatological: negative for rash ?Respiratory: negative for cough or wheezing ?Urologic: negative for hematuria ?Abdominal: negative for nausea, vomiting, diarrhea, bright red blood per rectum, melena, or hematemesis ?Neurologic: negative for visual changes, syncope, or dizziness ?All other systems reviewed and are otherwise negative except as noted above. ? ? ? ?Blood pressure 140/60, pulse 64, height '5\' 5"'$  (1.651 m), weight 171 lb 12.8 oz (77.9 kg), SpO2 93 %.  ?General appearance: alert and no distress ?Neck: no adenopathy, no carotid bruit, no JVD, supple, symmetrical, trachea midline, and thyroid not enlarged, symmetric, no tenderness/mass/nodules ?Lungs: clear to auscultation bilaterally ?Heart: regular rate and rhythm, S1, S2 normal, no murmur, click, rub or gallop ?Extremities: extremities normal, atraumatic, no cyanosis or edema ?Pulses: 2+ and symmetric ?Skin: Skin color, texture, turgor normal. No rashes or lesions ?Neurologic: Grossly normal ? ?EKG atrial flutter with ventricular sponsor of 64.  I personally reviewed this EKG. ? ?ASSESSMENT AND PLAN:  ? ?Essential hypertension ?History  of essential hypertension a blood pressure measured today at 140/60.  She is on carvedilol, clonidine, and Benicar. ? ?Hyperlipidemia associated with type 2 diabetes mellitus ([place]) ?History of hyperlipidemia on

## 2021-04-11 NOTE — Telephone Encounter (Signed)
Spoke with daughter and scheduled patient appointment for today  ?

## 2021-04-11 NOTE — Assessment & Plan Note (Signed)
History of elevated coronary calcium score of 2614 performed 04/11/2020.  This led to a cardiac catheterization performed by myself 04/12/2020 that showed moderate segmental proximal LAD stenosis which I did not think was obstructive.  I recommended medical therapy.  She denies chest pain. ?

## 2021-04-15 ENCOUNTER — Other Ambulatory Visit: Payer: Self-pay | Admitting: *Deleted

## 2021-04-15 NOTE — Patient Outreach (Signed)
Per [place] eligible member resides in [facility].  Screened for potential [facility] Acute Care Care Management services as a benefit of member's insurance plan. Mrs. Mcdade was previously active with [facility]. ? ?Mrs. Blanchette admitted to SNF on 04/03/21 after hospitalization. ? ?Facility site visit to [facility] skilled nursing facility. Met with Bryson Ha, SNF SW concerning transition plan and potential [facility] needs. Anticipated transition plan is to return home. Lives alone. Has supportive daughters that assist. ? ?Spoke with Mrs. Stephanie Coup in room at [facility] to discuss transition plans and [facility] follow up. Mrs. Sadek endorses the plan is to return home. Mrs. Decuir is also agreeable [facility] Care Management services post SNF. Discussed Probation officer will make referral for [facility] RNCM upon SNF transition. ? ?Provided [facility] Care Management brochure, 24-hr nurse advice line magnet, and writer's contact information.  ? ?Will continue to collaborate with SNF SW and follow transition plans/date. Will make referral to [facility] Management upon SNF discharge. ? ? ?Marthenia Rolling, MSN, RN,BSN ?[place] Coordinator ?[phone] [facility]) ?[phone]  (Toll free office)   ? ? ? ? ?  ?

## 2021-04-18 ENCOUNTER — Other Ambulatory Visit: Payer: Self-pay | Admitting: Cardiovascular Disease

## 2021-04-18 ENCOUNTER — Other Ambulatory Visit: Payer: Self-pay | Admitting: Cardiology

## 2021-04-18 DIAGNOSIS — E1169 Type 2 diabetes mellitus with other specified complication: Secondary | ICD-10-CM

## 2021-04-18 DIAGNOSIS — I10 Essential (primary) hypertension: Secondary | ICD-10-CM

## 2021-04-18 DIAGNOSIS — I200 Unstable angina: Secondary | ICD-10-CM

## 2021-04-18 MED ORDER — RANOLAZINE ER 500 MG PO TB12: 500.0000 mg | ORAL_TABLET | Freq: Two times a day (BID) | ORAL | 2 refills | Status: DC

## 2021-04-18 MED ORDER — ROSUVASTATIN CALCIUM 20 MG PO TABS: 20.0000 mg | ORAL_TABLET | Freq: Every day | ORAL | 2 refills | Status: DC

## 2021-04-22 ENCOUNTER — Other Ambulatory Visit: Payer: Self-pay | Admitting: *Deleted

## 2021-04-22 DIAGNOSIS — I10 Essential (primary) hypertension: Secondary | ICD-10-CM

## 2021-04-22 NOTE — Patient Outreach (Signed)
[facility] Post- Acute Care Coordinator follow up. Member screened for potential [facility] services as a a benefit of Mrs. [employer] plan.  ? ?Verified in [facility] that Mrs. Skalsky transitioned from [facility] to home on 04/20/21. Mrs. Freeze was active with [facility] prior to admission.  ? ?Confirmed with [facility] home health arranged for PT/OT services. ? ?Will make [place] Management referral for [facility] for complex case management post SNF.  ? ?Member has medical history of HTN, HLD, CHF, AFIB, DM, CAD. ? ? ?Marthenia Rolling, MSN, RN,BSN ?[place] Coordinator ?[phone] [facility]) ?[phone]  (Toll free office)   ?

## 2021-04-22 NOTE — Patient Outreach (Signed)
[facility]) Care Management ? ?04/22/2021 ? ?[name] ?29-Jun-1926 ?[id number] ? ? ?Received hospital referral from Marthenia Rolling, RN for [place] (central) for complex case management. Assigned to Deloria Lair, RN Care Coordinator for follow up. ? ?Philmore Pali ?[place] Management Assistant ?[phone] ? ?

## 2021-04-23 ENCOUNTER — Other Ambulatory Visit: Payer: Self-pay | Admitting: *Deleted

## 2021-04-23 ENCOUNTER — Other Ambulatory Visit: Payer: Self-pay | Admitting: Cardiology

## 2021-04-24 NOTE — Patient Instructions (Signed)
Visit Information ? ?Thank you for taking time to visit with me today. Please don't hesitate to contact me if I can be of assistance to you before our next scheduled telephone appointment. ? ?Following are the goals we discussed today:  ?(Copy and paste patient goals from clinical care plan here) ? ?Our next appointment is by telephone on 04/30/21. ? ?Following is a copy of your care plan:  ?Care Plan : [place] of Care  ?Updates made by Deloria Lair, NP since 04/24/2021 12:00 AM  ?  ? ?Problem: Heart Failure   ?Priority: High  ?Onset Date: 04/23/2021  ?  ? ?Long-Range Goal: Patient agrees to follow HF Action Plan and to report yellow zone sxs to NP over the next 60days.   ?Start Date: 04/23/2021  ?Expected End Date: 06/26/2021  ?Priority: High  ?Note:   ?Current Barriers:  ?Chronic Disease Management support and education needs related to CHF ? ?RNCM Clinical Goal(s):  ?Patient will verbalize understanding of plan for management of CHF as evidenced by pt report and chart review. ?take all medications exactly as prescribed and will call provider for medication related questions as evidenced by pt report and chart review    ?attend all scheduled medical appointments: primary care and cardiology as evidenced by pt report and chart review.        through collaboration with Consulting civil engineer, provider, and care team.  ? ?Interventions: ?Inter-disciplinary care team collaboration (see longitudinal plan of care) ?Evaluation of current treatment plan related to  self management and patient's adherence to plan as established by provider ? ?Patient Goals/Self-Care Activities: ?Take medications as prescribed   ?Attend all scheduled provider appointments ?Call pharmacy for medication refills 3-7 days in advance of running out of medications ?Call provider office for new concerns or questions  ? ? ?  ? ?Problem: Diabetes self management   ?Priority: Medium  ?Onset Date: 04/23/2021  ?  ? ?Goal: Patient will check her  glucose levels daily in am and 3 x a week in the evening over the next 30 days as reported by pt.   ?Start Date: 04/23/2021  ?Expected End Date: 05/24/2021  ?Priority: Medium  ?Note:   ? ?Current Barriers:  ?Chronic Disease Management support and education needs related to DMII ? ?RNCM Clinical Goal(s):  ?Patient will verbalize basic understanding of DMII disease process and self health management plan as evidenced by discussions with pt. ?take all medications exactly as prescribed and will call provider for medication related questions as evidenced by pt report.    ?attend all scheduled medical appointments: primary care providing DMII care as evidenced by pt report and chart review.        through collaboration with Consulting civil engineer, provider, and care team.  ? ?Interventions: ?Inter-disciplinary care team collaboration (see longitudinal plan of care) ?Evaluation of current treatment plan related to  self management and patient's adherence to plan as established by provider ? ?Patient Goals/Self-Care Activities: ?Take medications as prescribed   ?Attend all scheduled provider appointments ?Call pharmacy for medication refills 3-7 days in advance of running out of medications ?Call provider office for new concerns or questions  ? ? ?  ? ? ?The patient verbalized understanding of instructions, educational materials, and care plan provided today and agreed to receive a mailed copy of patient instructions, educational materials, and care plan.  ? ?Telephone follow up appointment with care management team member scheduled for: 04/30/21 ? ?Eulah Pont. Kortlynn Poust, MSN, GNP-BC ?Gerontological Nurse Practitioner ?  [facility] Care Management ?[phone] ? ? ? ? ?

## 2021-04-24 NOTE — Patient Outreach (Signed)
[facility]) Care Management ?Geriatric Nurse Practitioner Note ? ? ?04/24/2021 ?Name:  Allison Duffy MRN:  [id number] DOB:  1927/01/19 ? ?Summary: ?Pt home from short SNF stay, post hospitalization for pneumonia. ? ?Recommendations/Changes made from today's visit: ?Pt agrees to participate in [place] Management. Follow HF Action Plan and Diabetes regimen call NP or MD for any problems or questions. ? ?Patient Active Problem List  ? Diagnosis Date Noted  ? Acute respiratory failure with hypoxia secondary to community acquired pneumonia 03/29/2021  ? Acute respiratory failure with hypoxia ([place]) 03/29/2021  ? Diarrhea 03/29/2021  ? Lactic acidosis 03/29/2021  ? Hypokalemia 03/29/2021  ? Coronary artery disease 04/13/2020  ? Unstable angina ([place]) 04/11/2020  ? Mitral regurgitation 03/01/2019  ? Acute on chronic diastolic CHF (congestive heart failure) ([place]) 01/28/2019  ? Paroxysmal atrial fibrillation ([place]) 01/28/2019  ? Colitis, acute 05/31/2016  ? Primary osteoarthritis of right shoulder 12/27/2014  ? Pulmonary nodule 06/23/2014  ? Essential hypertension 07/05/2013  ? Hyperlipidemia associated with type 2 diabetes mellitus ([place]) 07/05/2013  ? Type 2 diabetes mellitus with complication, without long-term current use of insulin ([place]) 07/05/2013  ? Chest pain CAD 07/05/2013  ? ?Patient was recently discharged from hospital and all medications have been reviewed. ?Outpatient Encounter Medications as of 04/23/2021  ?Medication Sig Note  ? amLODipine (NORVASC) 10 MG tablet Take 10 mg by mouth at bedtime.   ? carboxymethylcellulose 1 % ophthalmic solution Place 1 drop into both eyes 2 (two) times daily. 04/23/2021: "Refresh"  ? carvedilol (COREG) 12.5 MG tablet TAKE 1 TABLET BY MOUTH TWICE DAILY WITH A MEAL   ? cholecalciferol (VITAMIN D) 1000 UNITS tablet Take 1,000 Units by mouth daily with lunch.   ? cholestyramine (QUESTRAN) 4 g packet Take 4 g by mouth daily as needed (IBS).   ? cloNIDine (CATAPRES)  0.1 MG tablet TAKE 2 TABLETS BY MOUTH EVERY MORNING AND 1 AT BEDTIME (Patient taking differently: Take 0.1-0.2 mg by mouth See admin instructions. Take 1 tablet 0.1 mg in the AM and 2 tabs 0.2 mg with evening meal.)   ? ELIQUIS 5 MG TABS tablet TAKE 1 TABLET BY MOUTH TWICE DAILY (Patient taking differently: Take 5 mg by mouth 2 (two) times daily.)   ? furosemide (LASIX) 40 MG tablet Take 1 tablet (40 mg total) by mouth in the morning AND 0.5 tablets (20 mg total) every evening. 04/23/2021: Only taking 40 mg in am at present.  ? GLIPIZIDE XL 10 MG 24 hr tablet Take 10 mg by mouth every morning.   ? Lancets (ONETOUCH ULTRASOFT) lancets use to check blood sugar twice a day as directed   ? metFORMIN (GLUCOPHAGE) 500 MG tablet Take 1 tablet (500 mg total) by mouth 2 (two) times daily with a meal.   ? Multiple Vitamin (MULTIVITAMIN WITH MINERALS) TABS Take 1 tablet by mouth daily with supper.   ? olmesartan (BENICAR) 40 MG tablet Take 40 mg by mouth at bedtime.   ? omeprazole (PRILOSEC) 40 MG capsule TAKE 1 CAPSULE BY MOUTH DAILY WITH SUPPER   ? ONETOUCH ULTRA test strip USE AS DIRECTED TO TEST BLOOD SUGAR TWICE DAILY   ? potassium chloride SA (KLOR-CON M) 20 MEQ tablet TAKE 1 TABLET BY MOUTH DAILY   ? Probiotic Product (PRO-BIOTIC BLEND) CAPS Take 1 capsule by mouth at bedtime. Takes OTC probiotic 04/23/2021: Currently IS taking it  ? ranolazine (RANEXA) 500 MG 12 hr tablet Take 1 tablet (500 mg total) by mouth 2 (  two) times daily. 04/23/2021: Takes with lunch at bed time.  ? rosuvastatin (CRESTOR) 20 MG tablet Take 1 tablet (20 mg total) by mouth at bedtime.   ? sitaGLIPtin (JANUVIA) 100 MG tablet Take 100 mg by mouth daily with lunch.   ? nitroGLYCERIN (NITROSTAT) 0.4 MG SL tablet PLACE 1 TABLET (0.4 MG TOTAL) UNDER THE TONGUE EVERY FIVE MINUTES X 3 DOSES AS NEEDED FOR CHEST PAIN. (Patient taking differently: Place 0.4 mg under the tongue every 5 (five) minutes as needed for chest pain.)   ? [DISCONTINUED]  diphenhydrAMINE-Acetaminophen (CORICIDIN NIGHTTIME COLD-COUGH PO) Take 1 tablet by mouth every 6 (six) hours as needed (cough/ cold symptoms). (Patient not taking: Reported on 04/11/2021)   ? [DISCONTINUED] Turmeric (QC TUMERIC COMPLEX PO) Take 500 mg by mouth daily as needed (allergies). (Patient not taking: Reported on 04/11/2021)   ? ?No facility-administered encounter medications on file as of 04/23/2021.  ? ?Subjective: ?Allison Duffy is an 88 y.o. year old female who is a primary patient of Burnard Bunting, MD. The care management team was consulted for assistance with care management and/or care coordination needs.   ? ?Geriatric Nurse Practitioner completed Telephone Visit today.  ? ?Objective: Current wt: 169, pt reports glucose is usually in normal range. Her average is 167, ? ?Medications Reviewed Today   ? ? Reviewed by Deloria Lair, NP (Nurse Practitioner) on 04/23/21 at 1031  Med List Status: <None>  ? ?Medication Order Taking? Sig Documenting Provider Last Dose Status Informant  ?amLODipine (NORVASC) 10 MG tablet [id number] Yes Take 10 mg by mouth at bedtime. [provider] Taking Active Multiple Informants  ?carboxymethylcellulose 1 % ophthalmic solution [id number] Yes Place 1 drop into both eyes 2 (two) times daily. [provider] Taking Active Multiple Informants  ?         ?Med Note ([place] Apr 23, 2021 10:27 AM) "Refresh"  ?carvedilol (COREG) 12.5 MG tablet [id number] Yes TAKE 1 TABLET BY MOUTH TWICE DAILY WITH A MEAL Lorretta Harp, MD Taking Active   ?cholecalciferol (VITAMIN D) 1000 UNITS tablet [id number] Yes Take 1,000 Units by mouth daily with lunch. [provider] Taking Active Multiple Informants  ?cholestyramine (QUESTRAN) 4 g packet [id number] Yes Take 4 g by mouth daily as needed (IBS). [provider] Taking Active Multiple Informants  ?         ?Med Note Carnella [place] Jun 15, 2019  2:45 PM)    ?cloNIDine (CATAPRES)  0.1 MG tablet [id number] Yes TAKE 2 TABLETS BY MOUTH EVERY MORNING AND 1 AT BEDTIME  ?Patient taking differently: Take 0.1-0.2 mg by mouth See admin instructions. Take 1 tablet 0.1 mg in the AM and 2 tabs 0.2 mg with evening meal.  ? Deberah Pelton, NP Taking Active Multiple Informants  ?ELIQUIS 5 MG TABS tablet [id number] Yes TAKE 1 TABLET BY MOUTH TWICE DAILY  ?Patient taking differently: Take 5 mg by mouth 2 (two) times daily.  ? Deberah Pelton, NP Taking Active Multiple Informants  ?furosemide (LASIX) 40 MG tablet [id number] Yes Take 1 tablet (40 mg total) by mouth in the morning AND 0.5 tablets (20 mg total) every evening. Lorretta Harp, MD Taking Active   ?         ?Med Note ([place] Apr 23, 2021 10:20 AM) Only taking 40 mg in am at present.  ?GLIPIZIDE XL 10 MG 24 hr tablet [id number] Yes Take 10 mg by  mouth every morning. [provider] Taking Active Multiple Informants  ?Lancets (ONETOUCH ULTRASOFT) lancets [id number] Yes use to check blood sugar twice a day as directed [provider] Taking Active Multiple Informants  ?metFORMIN (GLUCOPHAGE) 500 MG tablet [id number] Yes Take 1 tablet (500 mg total) by mouth 2 (two) times daily with a meal. Leonie Man, MD Taking Active Multiple Informants  ?Multiple Vitamin (MULTIVITAMIN WITH MINERALS) TABS [id number] Yes Take 1 tablet by mouth daily with supper. [provider] Taking Active Multiple Informants  ?nitroGLYCERIN (NITROSTAT) 0.4 MG SL tablet [id number]  PLACE 1 TABLET (0.4 MG TOTAL) UNDER THE TONGUE EVERY FIVE MINUTES X 3 DOSES AS NEEDED FOR CHEST PAIN.  ?Patient taking differently: Place 0.4 mg under the tongue every 5 (five) minutes as needed for chest pain.  ? Leonie Man, MD  Expired 04/13/21 2359 Multiple Informants  ?olmesartan (BENICAR) 40 MG tablet [id number] Yes Take 40 mg by mouth at bedtime. Burnard Bunting, MD Taking Active Multiple Informants  ?omeprazole (PRILOSEC) 40 MG capsule [id number] Yes  TAKE 1 CAPSULE BY MOUTH DAILY WITH SUPPER Lorretta Harp, MD Taking Active   ?ONETOUCH ULTRA test strip [id number] Yes USE AS DIRECTED TO TEST BLOOD SUGAR TWICE DAILY [provider] Taking Active Multiple I

## 2021-04-25 DIAGNOSIS — J189 Pneumonia, unspecified organism: Secondary | ICD-10-CM | POA: Diagnosis not present

## 2021-04-25 DIAGNOSIS — E1129 Type 2 diabetes mellitus with other diabetic kidney complication: Secondary | ICD-10-CM | POA: Diagnosis not present

## 2021-04-25 DIAGNOSIS — R6520 Severe sepsis without septic shock: Secondary | ICD-10-CM | POA: Diagnosis not present

## 2021-04-25 DIAGNOSIS — I110 Hypertensive heart disease with heart failure: Secondary | ICD-10-CM | POA: Diagnosis not present

## 2021-04-25 DIAGNOSIS — A419 Sepsis, unspecified organism: Secondary | ICD-10-CM | POA: Diagnosis not present

## 2021-04-25 DIAGNOSIS — I25118 Atherosclerotic heart disease of native coronary artery with other forms of angina pectoris: Secondary | ICD-10-CM | POA: Diagnosis not present

## 2021-04-25 DIAGNOSIS — E872 Acidosis, unspecified: Secondary | ICD-10-CM | POA: Diagnosis not present

## 2021-04-25 DIAGNOSIS — Z7189 Other specified counseling: Secondary | ICD-10-CM | POA: Diagnosis not present

## 2021-04-25 DIAGNOSIS — I4892 Unspecified atrial flutter: Secondary | ICD-10-CM | POA: Diagnosis not present

## 2021-04-25 DIAGNOSIS — M6281 Muscle weakness (generalized): Secondary | ICD-10-CM | POA: Diagnosis not present

## 2021-04-25 DIAGNOSIS — E785 Hyperlipidemia, unspecified: Secondary | ICD-10-CM | POA: Diagnosis not present

## 2021-04-25 DIAGNOSIS — I509 Heart failure, unspecified: Secondary | ICD-10-CM | POA: Diagnosis not present

## 2021-04-29 DIAGNOSIS — J9601 Acute respiratory failure with hypoxia: Secondary | ICD-10-CM | POA: Diagnosis not present

## 2021-04-29 DIAGNOSIS — I110 Hypertensive heart disease with heart failure: Secondary | ICD-10-CM | POA: Diagnosis not present

## 2021-04-29 DIAGNOSIS — I480 Paroxysmal atrial fibrillation: Secondary | ICD-10-CM | POA: Diagnosis not present

## 2021-04-29 DIAGNOSIS — E785 Hyperlipidemia, unspecified: Secondary | ICD-10-CM | POA: Diagnosis not present

## 2021-04-29 DIAGNOSIS — Z7984 Long term (current) use of oral hypoglycemic drugs: Secondary | ICD-10-CM | POA: Diagnosis not present

## 2021-04-29 DIAGNOSIS — I2510 Atherosclerotic heart disease of native coronary artery without angina pectoris: Secondary | ICD-10-CM | POA: Diagnosis not present

## 2021-04-29 DIAGNOSIS — Z9181 History of falling: Secondary | ICD-10-CM | POA: Diagnosis not present

## 2021-04-29 DIAGNOSIS — K589 Irritable bowel syndrome without diarrhea: Secondary | ICD-10-CM | POA: Diagnosis not present

## 2021-04-29 DIAGNOSIS — Z7901 Long term (current) use of anticoagulants: Secondary | ICD-10-CM | POA: Diagnosis not present

## 2021-04-29 DIAGNOSIS — J449 Chronic obstructive pulmonary disease, unspecified: Secondary | ICD-10-CM | POA: Diagnosis not present

## 2021-04-29 DIAGNOSIS — E1169 Type 2 diabetes mellitus with other specified complication: Secondary | ICD-10-CM | POA: Diagnosis not present

## 2021-04-29 DIAGNOSIS — I4892 Unspecified atrial flutter: Secondary | ICD-10-CM | POA: Diagnosis not present

## 2021-04-29 DIAGNOSIS — I5033 Acute on chronic diastolic (congestive) heart failure: Secondary | ICD-10-CM | POA: Diagnosis not present

## 2021-04-29 DIAGNOSIS — Z7982 Long term (current) use of aspirin: Secondary | ICD-10-CM | POA: Diagnosis not present

## 2021-04-29 DIAGNOSIS — K219 Gastro-esophageal reflux disease without esophagitis: Secondary | ICD-10-CM | POA: Diagnosis not present

## 2021-04-30 ENCOUNTER — Encounter: Payer: Self-pay | Admitting: *Deleted

## 2021-04-30 ENCOUNTER — Other Ambulatory Visit: Payer: Self-pay | Admitting: *Deleted

## 2021-04-30 NOTE — Patient Instructions (Signed)
Visit Information ? ?Thank you for taking time to visit with me today. Please don't hesitate to contact me if I can be of assistance to you before our next scheduled telephone appointment. ? ?Following are the goals we discussed today:  ?(Copy and paste patient goals from clinical care plan here) ? ?Our next appointment is by telephone on May 4 at 10:00am ? ?Following is a copy of your care plan:  ?Care Plan : [facility] of Care  ?Updates made by Deloria Lair, NP since 04/30/2021 12:00 AM  ?  ? ?Problem: Heart Failure   ?Priority: High  ?Onset Date: 04/23/2021  ?  ? ?Long-Range Goal: Patient agrees to follow HF Action Plan and to report yellow zone sxs to NP over the next 60days.   ?Start Date: 04/23/2021  ?Expected End Date: 06/26/2021  ?This Visit's Progress: On track  ?Priority: High  ?Note:   ? ?Update 04/30/21:  (Status: Goal on Track (progressing): YES.) Long Term Goal  ?Evaluation of current treatment plan related to HF management and patient's adherence to plan as established by provider ?     Mrs. Borrero is following the HF Action plan: weighs daily, low salt diet, takes her meds, knows when to call MD.  ? ?Current Barriers:  ?Chronic Disease Management support and education needs related to CHF ? ?RNCM Clinical Goal(s):  ?Patient will verbalize understanding of plan for management of CHF as evidenced by pt report and chart review. ?take all medications exactly as prescribed and will call provider for medication related questions as evidenced by pt report and chart review    ?attend all scheduled medical appointments: primary care and cardiology as evidenced by pt report and chart review.        through collaboration with Consulting civil engineer, provider, and care team.  ? ?Interventions: ?Inter-disciplinary care team collaboration (see longitudinal plan of care) ?Evaluation of current treatment plan related to  self management and patient's adherence to plan as established by provider ? ?Patient  Goals/Self-Care Activities: ?Take medications as prescribed   ?Attend all scheduled provider appointments ?Call pharmacy for medication refills 3-7 days in advance of running out of medications ?Call provider office for new concerns or questions  ? ? ?  ? ?Problem: Diabetes self management   ?Priority: Medium  ?Onset Date: 04/23/2021  ?  ? ?Goal: Patient will check her glucose levels daily in am and 3 x a week in the evening over the next 30 days as reported by pt.   ?Start Date: 04/23/2021  ?Expected End Date: 05/24/2021  ?This Visit's Progress: On track  ?Priority: Medium  ?Note:   ? ?Update 04/30/21:  (Status: Goal on Track (progressing): YES.) Short Term Goal  ?Evaluation of current treatment plan related to DM  and patient's adherence to plan as established by provider ?      Pt checks her glucose every morning. She says it ranges 150-170. Her last HgbA1C was 5.9! She is nearly out of strips and plans on calling Dr. Reynaldo Minium to have an Rx sent to her pharmacy. Encouraged her to check an occasional pm glucose.  ? ?Current Barriers:  ?Chronic Disease Management support and education needs related to DMII ? ?RNCM Clinical Goal(s):  ?Patient will verbalize basic understanding of DMII disease process and self health management plan as evidenced by discussions with pt. ?take all medications exactly as prescribed and will call provider for medication related questions as evidenced by pt report.    ?attend all scheduled  medical appointments: primary care providing DMII care as evidenced by pt report and chart review.        through collaboration with Consulting civil engineer, provider, and care team.  ? ?Interventions: ?Inter-disciplinary care team collaboration (see longitudinal plan of care) ?Evaluation of current treatment plan related to  self management and patient's adherence to plan as established by provider ? ?Patient Goals/Self-Care Activities: ?Take medications as prescribed   ?Attend all scheduled provider  appointments ?Call pharmacy for medication refills 3-7 days in advance of running out of medications ?Call provider office for new concerns or questions  ? ? ?  ? ? ?The patient verbalized understanding of instructions, educational materials, and care plan provided today and agreed to receive a mailed copy of patient instructions, educational materials, and care plan.  ? ?Telephone follow up appointment with care management team member scheduled for: May 4th. ? ?Eulah Pont. Everlina Gotts, MSN, GNP-BC ?Gerontological Nurse Practitioner ?[facility] Care Management ?[phone] ? ? ?

## 2021-04-30 NOTE — Patient Outreach (Signed)
[facility]) Care Management ?Geriatric Nurse Practitioner Note ? ? ?04/30/2021 ?Name:  Allison Duffy MRN:  [id number] DOB:  [birth year] ? ?Summary: ?Smooth recovery from hospitalization and SNF stay for pneumonia. She is stable and building her strength back. ? ?Recommendations/Changes made from today's visit: ?Give 100% during your home health PT visits. Do recommended exercises daily and after PT closes you case. ? ?Subjective: ?Allison Duffy is an 88 y.o. year old female who is a primary patient of Burnard Bunting, MD. The care management team was consulted for assistance with care management and/or care coordination needs.   ? ?Geriatric Nurse Practitioner completed Telephone Visit today.  ? ?Patient Active Problem List  ? Diagnosis Date Noted  ? Acute respiratory failure with hypoxia secondary to community acquired pneumonia 03/29/2021  ? Acute respiratory failure with hypoxia ([place]) 03/29/2021  ? Diarrhea 03/29/2021  ? Lactic acidosis 03/29/2021  ? Hypokalemia 03/29/2021  ? Coronary artery disease 04/13/2020  ? Unstable angina ([place]) 04/11/2020  ? Mitral regurgitation 03/01/2019  ? Acute on chronic diastolic CHF (congestive heart failure) ([place]) 01/28/2019  ? Paroxysmal atrial fibrillation ([place]) 01/28/2019  ? Colitis, acute 05/31/2016  ? Primary osteoarthritis of right shoulder 12/27/2014  ? Pulmonary nodule 06/23/2014  ? Essential hypertension 07/05/2013  ? Hyperlipidemia associated with type 2 diabetes mellitus ([employer]) 07/05/2013  ? Type 2 diabetes mellitus with complication, without long-term current use of insulin ([place]) 07/05/2013  ? Chest pain CAD 07/05/2013  ? ?Outpatient Encounter Medications as of 04/30/2021  ?Medication Sig Note  ? amLODipine (NORVASC) 10 MG tablet Take 10 mg by mouth at bedtime.   ? carboxymethylcellulose 1 % ophthalmic solution Place 1 drop into both eyes 2 (two) times daily. 04/23/2021: "Refresh"  ? carvedilol (COREG) 12.5 MG tablet TAKE 1 TABLET BY MOUTH TWICE DAILY  WITH A MEAL   ? cholecalciferol (VITAMIN D) 1000 UNITS tablet Take 1,000 Units by mouth daily with lunch.   ? cholestyramine (QUESTRAN) 4 g packet Take 4 g by mouth daily as needed (IBS).   ? cloNIDine (CATAPRES) 0.1 MG tablet TAKE 2 TABLETS BY MOUTH EVERY MORNING AND 1 AT BEDTIME (Patient taking differently: Take 0.1-0.2 mg by mouth See admin instructions. Take 1 tablet 0.1 mg in the AM and 2 tabs 0.2 mg with evening meal.)   ? ELIQUIS 5 MG TABS tablet TAKE 1 TABLET BY MOUTH TWICE DAILY (Patient taking differently: Take 5 mg by mouth 2 (two) times daily.)   ? furosemide (LASIX) 40 MG tablet Take 1 tablet (40 mg total) by mouth in the morning AND 0.5 tablets (20 mg total) every evening. 04/23/2021: Only taking 40 mg in am at present.  ? GLIPIZIDE XL 10 MG 24 hr tablet Take 10 mg by mouth every morning.   ? Lancets (ONETOUCH ULTRASOFT) lancets use to check blood sugar twice a day as directed   ? metFORMIN (GLUCOPHAGE) 500 MG tablet Take 1 tablet (500 mg total) by mouth 2 (two) times daily with a meal.   ? Multiple Vitamin (MULTIVITAMIN WITH MINERALS) TABS Take 1 tablet by mouth daily with supper.   ? nitroGLYCERIN (NITROSTAT) 0.4 MG SL tablet PLACE 1 TABLET (0.4 MG TOTAL) UNDER THE TONGUE EVERY FIVE MINUTES X 3 DOSES AS NEEDED FOR CHEST PAIN. (Patient taking differently: Place 0.4 mg under the tongue every 5 (five) minutes as needed for chest pain.)   ? olmesartan (BENICAR) 40 MG tablet Take 40 mg by mouth at bedtime.   ? omeprazole (PRILOSEC) 40 MG  capsule TAKE 1 CAPSULE BY MOUTH DAILY WITH SUPPER   ? ONETOUCH ULTRA test strip USE AS DIRECTED TO TEST BLOOD SUGAR TWICE DAILY   ? potassium chloride SA (KLOR-CON M) 20 MEQ tablet TAKE 1 TABLET BY MOUTH DAILY   ? Probiotic Product (PRO-BIOTIC BLEND) CAPS Take 1 capsule by mouth at bedtime. Takes OTC probiotic 04/23/2021: Currently IS taking it  ? ranolazine (RANEXA) 500 MG 12 hr tablet Take 1 tablet (500 mg total) by mouth 2 (two) times daily. 04/23/2021: Takes with lunch  at bed time.  ? rosuvastatin (CRESTOR) 20 MG tablet Take 1 tablet (20 mg total) by mouth at bedtime.   ? sitaGLIPtin (JANUVIA) 100 MG tablet Take 100 mg by mouth daily with lunch.   ? ?No facility-administered encounter medications on file as of 04/30/2021.  ? ?SDOH:  (Social Determinants of Health) assessments and interventions performed:  ?SDOH Interventions   ? ?Flowsheet Row Most Recent Value  ?SDOH Interventions   ?Food Insecurity Interventions Intervention Not Indicated  ?Transportation Interventions Intervention Not Indicated  ? ?  ? ?Care Plan ? ?Review of patient past medical history, allergies, medications, health status, including review of consultants reports, laboratory and other test data, was performed as part of comprehensive evaluation for care management services.  ? ?Care Plan : RN Care Manager Plan of Care  ?Updates made by Deloria Lair, NP since 04/30/2021 12:00 AM  ?  ? ?Problem: Heart Failure   ?Priority: High  ?Onset Date: 04/23/2021  ?  ? ?Long-Range Goal: Patient agrees to follow HF Action Plan and to report yellow zone sxs to NP over the next 60days.   ?Start Date: 04/23/2021  ?Expected End Date: 06/26/2021  ?This Visit's Progress: On track  ?Priority: High  ?Note:   ? ?Update 04/30/21:  (Status: Goal on Track (progressing): YES.) Long Term Goal  ?Evaluation of current treatment plan related to HF management and patient's adherence to plan as established by provider ?     Mrs. Allison Duffy is following the HF Action plan: weighs daily, low salt diet, takes her meds, knows when to call MD.  ? ?Current Barriers:  ?Chronic Disease Management support and education needs related to CHF ? ?RNCM Clinical Goal(s):  ?Patient will verbalize understanding of plan for management of CHF as evidenced by pt report and chart review. ?take all medications exactly as prescribed and will call provider for medication related questions as evidenced by pt report and chart review    ?attend all scheduled medical  appointments: primary care and cardiology as evidenced by pt report and chart review.        through collaboration with Consulting civil engineer, provider, and care team.  ? ?Interventions: ?Inter-disciplinary care team collaboration (see longitudinal plan of care) ?Evaluation of current treatment plan related to  self management and patient's adherence to plan as established by provider ? ?Patient Goals/Self-Care Activities: ?Take medications as prescribed   ?Attend all scheduled provider appointments ?Call pharmacy for medication refills 3-7 days in advance of running out of medications ?Call provider office for new concerns or questions  ? ? ?  ? ?Problem: Diabetes self management   ?Priority: Medium  ?Onset Date: 04/23/2021  ?  ? ?Goal: Patient will check her glucose levels daily in am and 3 x a week in the evening over the next 30 days as reported by pt.   ?Start Date: 04/23/2021  ?Expected End Date: 05/24/2021  ?This Visit's Progress: On track  ?Priority: Medium  ?Note:   ? ?  Update 04/30/21:  (Status: Goal on Track (progressing): YES.) Short Term Goal  ?Evaluation of current treatment plan related to DM  and patient's adherence to plan as established by provider ?      Pt checks her glucose every morning. She says it ranges 150-170. Her last HgbA1C was 5.9! She is nearly out of strips and plans on calling Dr. Reynaldo Minium to have an Rx sent to her pharmacy. Encouraged her to check an occasional pm glucose.  ? ?Current Barriers:  ?Chronic Disease Management support and education needs related to DMII ? ?RNCM Clinical Goal(s):  ?Patient will verbalize basic understanding of DMII disease process and self health management plan as evidenced by discussions with pt. ?take all medications exactly as prescribed and will call provider for medication related questions as evidenced by pt report.    ?attend all scheduled medical appointments: primary care providing DMII care as evidenced by pt report and chart review.        through  collaboration with Consulting civil engineer, provider, and care team.  ? ?Interventions: ?Inter-disciplinary care team collaboration (see longitudinal plan of care) ?Evaluation of current treatment plan related to  self managemen

## 2021-05-01 DIAGNOSIS — I2510 Atherosclerotic heart disease of native coronary artery without angina pectoris: Secondary | ICD-10-CM | POA: Diagnosis not present

## 2021-05-01 DIAGNOSIS — I480 Paroxysmal atrial fibrillation: Secondary | ICD-10-CM | POA: Diagnosis not present

## 2021-05-01 DIAGNOSIS — I110 Hypertensive heart disease with heart failure: Secondary | ICD-10-CM | POA: Diagnosis not present

## 2021-05-01 DIAGNOSIS — J449 Chronic obstructive pulmonary disease, unspecified: Secondary | ICD-10-CM | POA: Diagnosis not present

## 2021-05-01 DIAGNOSIS — J9601 Acute respiratory failure with hypoxia: Secondary | ICD-10-CM | POA: Diagnosis not present

## 2021-05-01 DIAGNOSIS — I5033 Acute on chronic diastolic (congestive) heart failure: Secondary | ICD-10-CM | POA: Diagnosis not present

## 2021-05-07 DIAGNOSIS — J9601 Acute respiratory failure with hypoxia: Secondary | ICD-10-CM | POA: Diagnosis not present

## 2021-05-07 DIAGNOSIS — I5033 Acute on chronic diastolic (congestive) heart failure: Secondary | ICD-10-CM | POA: Diagnosis not present

## 2021-05-07 DIAGNOSIS — J449 Chronic obstructive pulmonary disease, unspecified: Secondary | ICD-10-CM | POA: Diagnosis not present

## 2021-05-07 DIAGNOSIS — I480 Paroxysmal atrial fibrillation: Secondary | ICD-10-CM | POA: Diagnosis not present

## 2021-05-07 DIAGNOSIS — I110 Hypertensive heart disease with heart failure: Secondary | ICD-10-CM | POA: Diagnosis not present

## 2021-05-07 DIAGNOSIS — I2510 Atherosclerotic heart disease of native coronary artery without angina pectoris: Secondary | ICD-10-CM | POA: Diagnosis not present

## 2021-05-10 DIAGNOSIS — I5033 Acute on chronic diastolic (congestive) heart failure: Secondary | ICD-10-CM | POA: Diagnosis not present

## 2021-05-10 DIAGNOSIS — I110 Hypertensive heart disease with heart failure: Secondary | ICD-10-CM | POA: Diagnosis not present

## 2021-05-10 DIAGNOSIS — I480 Paroxysmal atrial fibrillation: Secondary | ICD-10-CM | POA: Diagnosis not present

## 2021-05-10 DIAGNOSIS — J9601 Acute respiratory failure with hypoxia: Secondary | ICD-10-CM | POA: Diagnosis not present

## 2021-05-10 DIAGNOSIS — J449 Chronic obstructive pulmonary disease, unspecified: Secondary | ICD-10-CM | POA: Diagnosis not present

## 2021-05-10 DIAGNOSIS — I2510 Atherosclerotic heart disease of native coronary artery without angina pectoris: Secondary | ICD-10-CM | POA: Diagnosis not present

## 2021-05-15 DIAGNOSIS — R42 Dizziness and giddiness: Secondary | ICD-10-CM | POA: Diagnosis not present

## 2021-05-15 DIAGNOSIS — I110 Hypertensive heart disease with heart failure: Secondary | ICD-10-CM | POA: Diagnosis not present

## 2021-05-15 DIAGNOSIS — E663 Overweight: Secondary | ICD-10-CM | POA: Diagnosis not present

## 2021-05-15 DIAGNOSIS — R5383 Other fatigue: Secondary | ICD-10-CM | POA: Diagnosis not present

## 2021-05-15 DIAGNOSIS — E1129 Type 2 diabetes mellitus with other diabetic kidney complication: Secondary | ICD-10-CM | POA: Diagnosis not present

## 2021-05-21 ENCOUNTER — Ambulatory Visit: Payer: Medicare Other | Admitting: Physician Assistant

## 2021-05-21 DIAGNOSIS — J9601 Acute respiratory failure with hypoxia: Secondary | ICD-10-CM | POA: Diagnosis not present

## 2021-05-21 DIAGNOSIS — J449 Chronic obstructive pulmonary disease, unspecified: Secondary | ICD-10-CM | POA: Diagnosis not present

## 2021-05-21 DIAGNOSIS — I110 Hypertensive heart disease with heart failure: Secondary | ICD-10-CM | POA: Diagnosis not present

## 2021-05-21 DIAGNOSIS — I480 Paroxysmal atrial fibrillation: Secondary | ICD-10-CM | POA: Diagnosis not present

## 2021-05-21 DIAGNOSIS — I5033 Acute on chronic diastolic (congestive) heart failure: Secondary | ICD-10-CM | POA: Diagnosis not present

## 2021-05-21 DIAGNOSIS — I2510 Atherosclerotic heart disease of native coronary artery without angina pectoris: Secondary | ICD-10-CM | POA: Diagnosis not present

## 2021-05-22 DIAGNOSIS — J449 Chronic obstructive pulmonary disease, unspecified: Secondary | ICD-10-CM | POA: Diagnosis not present

## 2021-05-22 DIAGNOSIS — I110 Hypertensive heart disease with heart failure: Secondary | ICD-10-CM | POA: Diagnosis not present

## 2021-05-22 DIAGNOSIS — I480 Paroxysmal atrial fibrillation: Secondary | ICD-10-CM | POA: Diagnosis not present

## 2021-05-22 DIAGNOSIS — I2510 Atherosclerotic heart disease of native coronary artery without angina pectoris: Secondary | ICD-10-CM | POA: Diagnosis not present

## 2021-05-22 DIAGNOSIS — I5033 Acute on chronic diastolic (congestive) heart failure: Secondary | ICD-10-CM | POA: Diagnosis not present

## 2021-05-22 DIAGNOSIS — J9601 Acute respiratory failure with hypoxia: Secondary | ICD-10-CM | POA: Diagnosis not present

## 2021-05-26 DIAGNOSIS — J449 Chronic obstructive pulmonary disease, unspecified: Secondary | ICD-10-CM | POA: Diagnosis not present

## 2021-05-26 DIAGNOSIS — I480 Paroxysmal atrial fibrillation: Secondary | ICD-10-CM | POA: Diagnosis not present

## 2021-05-26 DIAGNOSIS — I110 Hypertensive heart disease with heart failure: Secondary | ICD-10-CM | POA: Diagnosis not present

## 2021-05-26 DIAGNOSIS — I2510 Atherosclerotic heart disease of native coronary artery without angina pectoris: Secondary | ICD-10-CM | POA: Diagnosis not present

## 2021-05-26 DIAGNOSIS — J9601 Acute respiratory failure with hypoxia: Secondary | ICD-10-CM | POA: Diagnosis not present

## 2021-05-26 DIAGNOSIS — I5033 Acute on chronic diastolic (congestive) heart failure: Secondary | ICD-10-CM | POA: Diagnosis not present

## 2021-05-29 DIAGNOSIS — I480 Paroxysmal atrial fibrillation: Secondary | ICD-10-CM | POA: Diagnosis not present

## 2021-05-29 DIAGNOSIS — I5033 Acute on chronic diastolic (congestive) heart failure: Secondary | ICD-10-CM | POA: Diagnosis not present

## 2021-05-29 DIAGNOSIS — Z7984 Long term (current) use of oral hypoglycemic drugs: Secondary | ICD-10-CM | POA: Diagnosis not present

## 2021-05-29 DIAGNOSIS — Z7901 Long term (current) use of anticoagulants: Secondary | ICD-10-CM | POA: Diagnosis not present

## 2021-05-29 DIAGNOSIS — I110 Hypertensive heart disease with heart failure: Secondary | ICD-10-CM | POA: Diagnosis not present

## 2021-05-29 DIAGNOSIS — I2510 Atherosclerotic heart disease of native coronary artery without angina pectoris: Secondary | ICD-10-CM | POA: Diagnosis not present

## 2021-05-29 DIAGNOSIS — Z9181 History of falling: Secondary | ICD-10-CM | POA: Diagnosis not present

## 2021-05-29 DIAGNOSIS — Z7982 Long term (current) use of aspirin: Secondary | ICD-10-CM | POA: Diagnosis not present

## 2021-05-29 DIAGNOSIS — J9601 Acute respiratory failure with hypoxia: Secondary | ICD-10-CM | POA: Diagnosis not present

## 2021-05-29 DIAGNOSIS — E1169 Type 2 diabetes mellitus with other specified complication: Secondary | ICD-10-CM | POA: Diagnosis not present

## 2021-05-29 DIAGNOSIS — J449 Chronic obstructive pulmonary disease, unspecified: Secondary | ICD-10-CM | POA: Diagnosis not present

## 2021-05-29 DIAGNOSIS — I4892 Unspecified atrial flutter: Secondary | ICD-10-CM | POA: Diagnosis not present

## 2021-05-29 DIAGNOSIS — E785 Hyperlipidemia, unspecified: Secondary | ICD-10-CM | POA: Diagnosis not present

## 2021-05-29 DIAGNOSIS — K589 Irritable bowel syndrome without diarrhea: Secondary | ICD-10-CM | POA: Diagnosis not present

## 2021-05-29 DIAGNOSIS — K219 Gastro-esophageal reflux disease without esophagitis: Secondary | ICD-10-CM | POA: Diagnosis not present

## 2021-05-30 ENCOUNTER — Other Ambulatory Visit: Payer: Self-pay | Admitting: *Deleted

## 2021-05-30 NOTE — Patient Outreach (Signed)
[facility]) Care Management ?Geriatric Nurse Practitioner Note ? ? ?05/30/2021 ?Name:  Allison Duffy MRN:  [id number] DOB:  04-Aug-1926 ? ?Summary: ?Allison Duffy is stable and she is able to do everything she wants to do. OT has discharged her. No hypoglycemic episodes. ? ?Recommendations/Changes made from today's visit: ?Continue self management monitoring of glucose and weights. ? ?Subjective: ?Allison Duffy is an 88 y.o. year old female who is a primary patient of Burnard Bunting, MD. The care management team was consulted for assistance with care management and/or care coordination needs.   ? ?Geriatric Nurse Practitioner completed Telephone Visit today.  ? ?Objective: Wt 164 lb (74.4 kg)   BMI 27.29 kg/m? , FBS 187 ? ?Outpatient Encounter Medications as of 05/30/2021  ?Medication Sig Note  ? amLODipine (NORVASC) 10 MG tablet Take 10 mg by mouth at bedtime.   ? carboxymethylcellulose 1 % ophthalmic solution Place 1 drop into both eyes 2 (two) times daily. 04/23/2021: "Refresh"  ? carvedilol (COREG) 12.5 MG tablet TAKE 1 TABLET BY MOUTH TWICE DAILY WITH A MEAL   ? cholecalciferol (VITAMIN D) 1000 UNITS tablet Take 1,000 Units by mouth daily with lunch.   ? cholestyramine (QUESTRAN) 4 g packet Take 4 g by mouth daily as needed (IBS).   ? cloNIDine (CATAPRES) 0.1 MG tablet TAKE 2 TABLETS BY MOUTH EVERY MORNING AND 1 AT BEDTIME (Patient taking differently: Take 0.1-0.2 mg by mouth See admin instructions. Take 1 tablet 0.1 mg in the AM and 2 tabs 0.2 mg with evening meal.)   ? ELIQUIS 5 MG TABS tablet TAKE 1 TABLET BY MOUTH TWICE DAILY (Patient taking differently: Take 5 mg by mouth 2 (two) times daily.)   ? furosemide (LASIX) 40 MG tablet Take 1 tablet (40 mg total) by mouth in the morning AND 0.5 tablets (20 mg total) every evening. 04/23/2021: Only taking 40 mg in am at present.  ? Lancets (ONETOUCH ULTRASOFT) lancets use to check blood sugar twice a day as directed   ? metFORMIN (GLUCOPHAGE) 500  MG tablet Take 1 tablet (500 mg total) by mouth 2 (two) times daily with a meal.   ? Multiple Vitamin (MULTIVITAMIN WITH MINERALS) TABS Take 1 tablet by mouth daily with supper.   ? nitroGLYCERIN (NITROSTAT) 0.4 MG SL tablet PLACE 1 TABLET (0.4 MG TOTAL) UNDER THE TONGUE EVERY FIVE MINUTES X 3 DOSES AS NEEDED FOR CHEST PAIN. (Patient taking differently: Place 0.4 mg under the tongue every 5 (five) minutes as needed for chest pain.)   ? olmesartan (BENICAR) 40 MG tablet Take 40 mg by mouth at bedtime.   ? omeprazole (PRILOSEC) 40 MG capsule TAKE 1 CAPSULE BY MOUTH DAILY WITH SUPPER   ? ONETOUCH ULTRA test strip USE AS DIRECTED TO TEST BLOOD SUGAR TWICE DAILY   ? potassium chloride SA (KLOR-CON M) 20 MEQ tablet TAKE 1 TABLET BY MOUTH DAILY   ? Probiotic Product (PRO-BIOTIC BLEND) CAPS Take 1 capsule by mouth at bedtime. Takes OTC probiotic 04/23/2021: Currently IS taking it  ? ranolazine (RANEXA) 500 MG 12 hr tablet Take 1 tablet (500 mg total) by mouth 2 (two) times daily. 04/23/2021: Takes with lunch at bed time.  ? rosuvastatin (CRESTOR) 20 MG tablet Take 1 tablet (20 mg total) by mouth at bedtime.   ? sitaGLIPtin (JANUVIA) 100 MG tablet Take 100 mg by mouth daily with lunch.   ? [DISCONTINUED] GLIPIZIDE XL 10 MG 24 hr tablet Take 10 mg by mouth every morning.   ? ?  No facility-administered encounter medications on file as of 05/30/2021.  ? ?Care Plan ? ?Review of patient past medical history, allergies, medications, health status, including review of consultants reports, laboratory and other test data, was performed as part of comprehensive evaluation for care management services.  ? ?Care Plan : RN Care Manager Plan of Care  ?Updates made by Deloria Lair, NP since 05/30/2021 12:00 AM  ?  ? ?Problem: Heart Failure   ?Priority: High  ?Onset Date: 04/23/2021  ?  ? ?Long-Range Goal: Patient agrees to follow HF Action Plan and to report yellow zone sxs to NP over the next 60days.   ?Start Date: 04/23/2021  ?Expected End  Date: 06/27/2021  ?This Visit's Progress: On track  ?Recent Progress: On track  ?Priority: High  ?Note:   ? ?Update 05/30/21:  (Status: Goal on Track (progressing): YES.) Long Term Goal  ?Evaluation of current treatment plan related to FOLLOWING HF ACTION PLAN and patient's adherence to plan as established by provider ?      Allison Duffy reports her weight this am was 164#. She says she only get a little SOB with overexertion. She does have some LE edema but she has started wearing compression stockings and this has helped. She avoids adding salt and her daughter watches to buy low na food. She says she checks her BP only when she feels funny and she hasn't had to check it lately. Encouraged her to continue her self management efforts - DOING GREAT! ? ?Update 04/30/21:  (Status: Goal on Track (progressing): YES.) Long Term Goal  ?Evaluation of current treatment plan related to HF management and patient's adherence to plan as established by provider ?     Allison Duffy is following the HF Action plan: weighs daily, low salt diet, takes her meds, knows when to call MD.  ? ?Current Barriers:  ?Chronic Disease Management support and education needs related to CHF ? ?RNCM Clinical Goal(s):  ?Patient will verbalize understanding of plan for management of CHF as evidenced by pt report and chart review. ?take all medications exactly as prescribed and will call provider for medication related questions as evidenced by pt report and chart review    ?attend all scheduled medical appointments: primary care and cardiology as evidenced by pt report and chart review.        through collaboration with Consulting civil engineer, provider, and care team.  ? ?Interventions: ?Inter-disciplinary care team collaboration (see longitudinal plan of care) ?Evaluation of current treatment plan related to  self management and patient's adherence to plan as established by provider ? ?Patient Goals/Self-Care Activities: ?Take medications as prescribed   ?Attend  all scheduled provider appointments ?Call pharmacy for medication refills 3-7 days in advance of running out of medications ?Call provider office for new concerns or questions  ? ? ?  ? ?Problem: Diabetes self management   ?Priority: Medium  ?Onset Date: 04/23/2021  ?  ? ?Goal: Patient will check her glucose levels daily in am and 3 x a week in the evening over the next 30 days as reported by pt. (continued to 06/27/21)   ?Start Date: 04/23/2021  ?Expected End Date: 06/27/2021  ?Recent Progress: On track  ?Priority: Medium  ?Note:   ? ?Update 05/30/21:  (Status: Goal on Track (progressing): YES.) Long Term Goal  EXTENDING GOAL UNTIL 06/27/21 CALL. ?Evaluation of current treatment plan related to MONITORING HER GLUCOSE LEVELS and patient's adherence to plan as established by provider ?      Mrs. Gedeon  reports Dr. Reynaldo Minium has discontinued her glipizide since some of her glucose levels have been low. She continues on Jardiance and Metformin. She reports she is checking her levels and they are higher. Her FBS and NFBS range between 179-234. Encouraged her continued monitoring, following her diet, daily foot checks and call MD/NP for any problems. ? ?Update 04/30/21:  (Status: Goal on Track (progressing): YES.) Short Term Goal  ?Evaluation of current treatment plan related to DM  and patient's adherence to plan as established by provider ?      Pt checks her glucose every morning. She says it ranges 150-170. Her last HgbA1C was 5.9! She is nearly out of strips and plans on calling Dr. Reynaldo Minium to have an Rx sent to her pharmacy. Encouraged her to check an occasional pm glucose.  ? ?Current Barriers:  ?Chronic Disease Management support and education needs related to DMII ? ?RNCM Clinical Goal(s):  ?Patient will verbalize basic understanding of DMII disease process and self health management plan as evidenced by discussions with pt. ?take all medications exactly as prescribed and will call provider for medication related questions as  evidenced by pt report.    ?attend all scheduled medical appointments: primary care providing DMII care as evidenced by pt report and chart review.        through collaboration with Consulting civil engineer, provid

## 2021-06-19 ENCOUNTER — Ambulatory Visit: Payer: Medicare Other | Admitting: *Deleted

## 2021-06-19 DIAGNOSIS — J9601 Acute respiratory failure with hypoxia: Secondary | ICD-10-CM | POA: Diagnosis not present

## 2021-06-19 DIAGNOSIS — I5033 Acute on chronic diastolic (congestive) heart failure: Secondary | ICD-10-CM | POA: Diagnosis not present

## 2021-06-19 DIAGNOSIS — J449 Chronic obstructive pulmonary disease, unspecified: Secondary | ICD-10-CM | POA: Diagnosis not present

## 2021-06-19 DIAGNOSIS — I2510 Atherosclerotic heart disease of native coronary artery without angina pectoris: Secondary | ICD-10-CM | POA: Diagnosis not present

## 2021-06-19 DIAGNOSIS — I480 Paroxysmal atrial fibrillation: Secondary | ICD-10-CM | POA: Diagnosis not present

## 2021-06-19 DIAGNOSIS — I110 Hypertensive heart disease with heart failure: Secondary | ICD-10-CM | POA: Diagnosis not present

## 2021-06-27 ENCOUNTER — Other Ambulatory Visit: Payer: Self-pay | Admitting: *Deleted

## 2021-06-27 NOTE — Patient Outreach (Signed)
[facility]) Care Management Geriatric Nurse Practitioner Note   06/27/2021 Name:  Allison Duffy MRN:  [id number] DOB:  December 12, 1926  Summary: HF stable, Fasting Glucose levels are out of range in 200's  Recommendations/Changes made from today's visit: Continue HF Action plan. Please check periodic afternoon glucose readings.   Subjective: Allison Duffy is an 88 y.o. year old female who is a primary patient of Burnard Bunting, MD. The care management team was consulted for assistance with care management and/or care coordination needs.    Geriatric Nurse Practitioner completed Telephone Visit today.   Objective:  Medications Reviewed Today     Reviewed by Deloria Lair, NP (Nurse Practitioner) on 06/27/21 at 1045  Med List Status: <None>   Medication Order Taking? Sig Documenting Provider Last Dose Status Informant  amLODipine (NORVASC) 10 MG tablet [id number] No Take 10 mg by mouth at bedtime. [provider] Taking Active Multiple Informants  carboxymethylcellulose 1 % ophthalmic solution [id number] No Place 1 drop into both eyes 2 (two) times daily. [provider] Taking Active Multiple Informants           Med Note Deloria Lair   Tue Apr 23, 2021 10:27 AM) "Refresh"  carvedilol (COREG) 12.5 MG tablet [id number] No TAKE 1 TABLET BY MOUTH TWICE DAILY WITH A MEAL Lorretta Harp, MD Taking Active   cholecalciferol (VITAMIN D) 1000 UNITS tablet [id number] No Take 1,000 Units by mouth daily with lunch. [provider] Taking Active Multiple Informants  cholestyramine (QUESTRAN) 4 g packet [id number] No Take 4 g by mouth daily as needed (IBS). [provider] Taking Active Multiple Informants           Med Note Richardson Landry, Linton Rump   Wed Jun 15, 2019  2:45 PM)    cloNIDine (CATAPRES) 0.1 MG tablet [id number] No TAKE 2 TABLETS BY MOUTH EVERY MORNING AND 1 AT BEDTIME  Patient taking differently: Take 0.1-0.2 mg by mouth See admin  instructions. Take 1 tablet 0.1 mg in the AM and 2 tabs 0.2 mg with evening meal.   Deberah Pelton, NP Taking Active Multiple Informants  ELIQUIS 5 MG TABS tablet [id number] No TAKE 1 TABLET BY MOUTH TWICE DAILY  Patient taking differently: Take 5 mg by mouth 2 (two) times daily.   Deberah Pelton, NP Taking Active Multiple Informants  furosemide (LASIX) 40 MG tablet [id number] No Take 1 tablet (40 mg total) by mouth in the morning AND 0.5 tablets (20 mg total) every evening. Lorretta Harp, MD Taking Active            Med Note ([place] Apr 23, 2021 10:20 AM) Only taking 40 mg in am at present.  Lancets (ONETOUCH ULTRASOFT) lancets [id number] No use to check blood sugar twice a day as directed [provider] Taking Active Multiple Informants  metFORMIN (GLUCOPHAGE) 500 MG tablet [id number] No Take 1 tablet (500 mg total) by mouth 2 (two) times daily with a meal. Leonie Man, MD Taking Active Multiple Informants  Multiple Vitamin (MULTIVITAMIN WITH MINERALS) TABS [id number] No Take 1 tablet by mouth daily with supper. [provider] Taking Active Multiple Informants  nitroGLYCERIN (NITROSTAT) 0.4 MG SL tablet [id number] No PLACE 1 TABLET (0.4 MG TOTAL) UNDER THE TONGUE EVERY FIVE MINUTES X 3 DOSES AS NEEDED FOR CHEST PAIN.  Patient taking differently: Place 0.4 mg under the tongue every 5 (five) minutes as needed for chest pain.   Leonie Man,  MD Taking Expired 04/13/21 2359 Multiple Informants  olmesartan (BENICAR) 40 MG tablet [id number] No Take 40 mg by mouth at bedtime. Burnard Bunting, MD Taking Active Multiple Informants  omeprazole (PRILOSEC) 40 MG capsule [id number] No TAKE 1 CAPSULE BY MOUTH DAILY WITH SUPPER Lorretta Harp, MD Taking Active   [facility] ULTRA test strip [id number] No USE AS DIRECTED TO TEST BLOOD SUGAR TWICE DAILY [provider] Taking Active Multiple Informants  potassium chloride SA (KLOR-CON M) 20 MEQ tablet [id number]  No TAKE 1 TABLET BY MOUTH DAILY Lorretta Harp, MD Taking Active   Probiotic Product (PRO-BIOTIC BLEND) CAPS [id number] No Take 1 capsule by mouth at bedtime. Takes OTC probiotic Burnard Bunting, MD Taking Active Multiple Informants           Med Note Myrtie Neither, [place] Apr 23, 2021 10:25 AM) Currently IS taking it  ranolazine (RANEXA) 500 MG 12 hr tablet [id number] No Take 1 tablet (500 mg total) by mouth 2 (two) times daily. Lorretta Harp, MD Taking Active            Med Note ([place] Apr 23, 2021 10:26 AM) Takes with lunch at bed time.  rosuvastatin (CRESTOR) 20 MG tablet [id number] No Take 1 tablet (20 mg total) by mouth at bedtime. Lorretta Harp, MD Taking Active   sitaGLIPtin (JANUVIA) 100 MG tablet [id number] No Take 100 mg by mouth daily with lunch. [provider] Taking Active Multiple Informants             SDOH:  (Social Determinants of Health) assessments and interventions performed:    Care Plan  Review of patient past medical history, allergies, medications, health status, including review of consultants reports, laboratory and other test data, was performed as part of comprehensive evaluation for care management services.   Care Plan : RN Care Manager Plan of Care  Updates made by Deloria Lair, NP since 06/27/2021 12:00 AM     Problem: Heart Failure   Priority: High  Onset Date: 04/23/2021     Long-Range Goal: Patient agrees to follow HF Action Plan and to report yellow zone sxs to NP over the next 60days.   Start Date: 04/23/2021  Expected End Date: 06/27/2021  This Visit's Progress: On track  Recent Progress: On track  Priority: High  Note:    Update 06/27/21:  (Status: Goal on Track (progressing): YES.) Long Term Goal  Evaluation of current treatment plan related to FOLLOWING HF ACTION PLAN and patient's adherence to plan as established by provider Allison Duffy reports her wt today was 165#, SOB occasionally on exertion,  minimal edema L>R. She knows to call when she has an increase in any of these issues  Update 05/30/21:  (Status: Goal on Track (progressing): YES.) Long Term Goal  Evaluation of current treatment plan related to FOLLOWING HF ACTION PLAN and patient's adherence to plan as established by provider       Allison Duffy reports her weight this am was 164#. She says she only get a little SOB with overexertion. She does have some LE edema but she has started wearing compression stockings and this has helped. She avoids adding salt and her daughter watches to buy low na food. She says she checks her BP only when she feels funny and she hasn't had to check it lately. Encouraged her to continue her self management efforts - DOING GREAT!  Update 04/30/21:  (Status: Goal on Track (progressing): YES.) Long Term  Goal  Evaluation of current treatment plan related to HF management and patient's adherence to plan as established by provider      Allison Duffy is following the HF Action plan: weighs daily, low salt diet, takes her meds, knows when to call MD.   Current Barriers:  Chronic Disease Management support and education needs related to CHF  RNCM Clinical Goal(s):  Patient will verbalize understanding of plan for management of CHF as evidenced by pt report and chart review. take all medications exactly as prescribed and will call provider for medication related questions as evidenced by pt report and chart review    attend all scheduled medical appointments: primary care and cardiology as evidenced by pt report and chart review.        through collaboration with RN Care manager, provider, and care team.   Interventions: Inter-disciplinary care team collaboration (see longitudinal plan of care) Evaluation of current treatment plan related to  self management and patient's adherence to plan as established by provider  Patient Goals/Self-Care Activities: Take medications as prescribed   Attend all scheduled  provider appointments Call pharmacy for medication refills 3-7 days in advance of running out of medications Call provider office for new concerns or questions       Problem: Diabetes self management   Priority: Medium  Onset Date: 04/23/2021     Long-Range Goal: Patient will check her glucose levels daily in am and 3 x a week in the evening over the next 30 days as reported by pt. (continued to July appt, changed to long term)   Start Date: 04/23/2021  Expected End Date: 07/03/2021  Recent Progress: On track  Priority: Medium  Note:    Update 06/27/21:  (Status: Goal on Track (progressing): YES.) Long Term Goal  Evaluation of current treatment plan related to GLUCOSE MONITORING and patient's adherence to plan as established by provider Pt reports all her FBSs are running in mid 200's. Today it was 247. She was previously taken off glipizide due to her HgbA1C was down to 5.9.  She continues to take metformin 500 mg bid and januvia 100 mg daily. NP messaged Dr. Reynaldo Minium to see if he would like to adjust her diabetes medications.  Update 05/30/21:  (Status: Goal on Track (progressing): YES.) Long Term Goal  EXTENDING GOAL UNTIL 06/27/21 CALL. Evaluation of current treatment plan related to MONITORING HER GLUCOSE LEVELS and patient's adherence to plan as established by provider       Allison Duffy reports Dr. Reynaldo Minium has discontinued her glipizide since some of her glucose levels have been low. She continues on Jardiance and Metformin. She reports she is checking her levels and they are higher. Her FBS and NFBS range between 179-234. Encouraged her continued monitoring, following her diet, daily foot checks and call MD/NP for any problems.  Update 04/30/21:  (Status: Goal on Track (progressing): YES.) Short Term Goal  Evaluation of current treatment plan related to DM  and patient's adherence to plan as established by provider       Pt checks her glucose every morning. She says it ranges 150-170. Her last  HgbA1C was 5.9! She is nearly out of strips and plans on calling Dr. Reynaldo Minium to have an Rx sent to her pharmacy. Encouraged her to check an occasional pm glucose.   Current Barriers:  Chronic Disease Management support and education needs related to DMII  RNCM Clinical Goal(s):  Patient will verbalize basic understanding of DMII disease process and self health management  plan as evidenced by discussions with pt. take all medications exactly as prescribed and will call provider for medication related questions as evidenced by pt report.    attend all scheduled medical appointments: primary care providing DMII care as evidenced by pt report and chart review.        through collaboration with RN Care manager, provider, and care team.   Interventions: Inter-disciplinary care team collaboration (see longitudinal plan of care) Evaluation of current treatment plan related to  self management and patient's adherence to plan as established by provider  Patient Goals/Self-Care Activities: Take medications as prescribed   Attend all scheduled provider appointments Call pharmacy for medication refills 3-7 days in advance of running out of medications Call provider office for new concerns or questions         Plan: Telephone follow up appointment with care management team member scheduled for:  one month. Pt agrees to follow care plan.  Eulah Pont. Myrtie Neither, MSN, [facility] Gerontological Nurse Practitioner [facility] Care Management [phone]

## 2021-07-05 ENCOUNTER — Other Ambulatory Visit: Payer: Self-pay | Admitting: General Practice

## 2021-07-05 ENCOUNTER — Other Ambulatory Visit: Payer: Self-pay | Admitting: Cardiovascular Disease

## 2021-07-05 DIAGNOSIS — I10 Essential (primary) hypertension: Secondary | ICD-10-CM

## 2021-07-05 DIAGNOSIS — I200 Unstable angina: Secondary | ICD-10-CM

## 2021-07-05 DIAGNOSIS — E1169 Type 2 diabetes mellitus with other specified complication: Secondary | ICD-10-CM

## 2021-07-31 ENCOUNTER — Other Ambulatory Visit: Payer: Self-pay | Admitting: *Deleted

## 2021-07-31 NOTE — Patient Outreach (Signed)
Allison Duffy) Care Management Geriatric Nurse Practitioner Note   07/31/2021 Name:  Allison Duffy MRN:  [id number] DOB:  [birth year]  Summary: Pt is stable.  Recommendations/Changes made from today's visit: Continue your self management for HF and DM. See Dr. Reynaldo Duffy and follow his instructions.  Subjective: Allison Duffy is an 88 y.o. year old female who is a primary patient of Allison Bunting, MD. The care management team was consulted for assistance with care management and/or care coordination needs.    Geriatric Nurse Practitioner completed Telephone Visit today.   Outpatient Encounter Medications as of 07/31/2021  Medication Sig Note   amLODipine (NORVASC) 10 MG tablet Take 10 mg by mouth at bedtime.    carboxymethylcellulose 1 % ophthalmic solution Place 1 drop into both eyes 2 (two) times daily. 04/23/2021: "Refresh"   carvedilol (COREG) 12.5 MG tablet TAKE 1 TABLET BY MOUTH TWICE DAILY WITH A MEAL    cholecalciferol (VITAMIN D) 1000 UNITS tablet Take 1,000 Units by mouth daily with lunch.    cholestyramine (QUESTRAN) 4 g packet Take 4 g by mouth daily as needed (IBS).    cloNIDine (CATAPRES) 0.1 MG tablet TAKE 2 TABLET BY MOUTH EVERY MORNING AND 1 TABLET BY MOUTH EVERY NIGHT AT BEDTIME    ELIQUIS 5 MG TABS tablet TAKE 1 TABLET BY MOUTH TWICE DAILY (Patient taking differently: Take 5 mg by mouth 2 (two) times daily.)    furosemide (LASIX) 40 MG tablet Take 1 tablet (40 mg total) by mouth in the morning AND 0.5 tablets (20 mg total) every evening. 04/23/2021: Only taking 40 mg in am at present.   Lancets (ONETOUCH ULTRASOFT) lancets use to check blood sugar twice a day as directed    metFORMIN (GLUCOPHAGE) 500 MG tablet Take 1 tablet (500 mg total) by mouth 2 (two) times daily with a meal.    Multiple Vitamin (MULTIVITAMIN WITH MINERALS) TABS Take 1 tablet by mouth daily with supper.    nitroGLYCERIN (NITROSTAT) 0.4 MG SL tablet PLACE 1 TABLET (0.4 MG TOTAL) UNDER  THE TONGUE EVERY FIVE MINUTES X 3 DOSES AS NEEDED FOR CHEST PAIN. (Patient taking differently: Place 0.4 mg under the tongue every 5 (five) minutes as needed for chest pain.)    olmesartan (BENICAR) 40 MG tablet Take 40 mg by mouth at bedtime.    omeprazole (PRILOSEC) 40 MG capsule TAKE 1 CAPSULE BY MOUTH DAILY WITH SUPPER    ONETOUCH ULTRA test strip USE AS DIRECTED TO TEST BLOOD SUGAR TWICE DAILY    potassium chloride SA (KLOR-CON M) 20 MEQ tablet TAKE 1 TABLET BY MOUTH DAILY    Probiotic Product (PRO-BIOTIC BLEND) CAPS Take 1 capsule by mouth at bedtime. Takes OTC probiotic 04/23/2021: Currently IS taking it   ranolazine (RANEXA) 500 MG 12 hr tablet TAKE 1 TABLET(500 MG) BY MOUTH TWICE DAILY    rosuvastatin (CRESTOR) 20 MG tablet TAKE 1 TABLET(20 MG) BY MOUTH AT BEDTIME    sitaGLIPtin (JANUVIA) 100 MG tablet Take 100 mg by mouth daily with lunch.    No facility-administered encounter medications on file as of 07/31/2021.   Care Plan  Review of patient past medical history, allergies, medications, health status, including review of consultants reports, laboratory and other test data, was performed as part of comprehensive evaluation for care management services.   Care Plan : RN Care Manager Plan of Care  Updates made by Allison Lair, NP since 07/31/2021 12:00 AM     Problem: Heart Failure   Priority:  High  Onset Date: 04/23/2021     Long-Range Goal: Patient agrees to follow HF Action Plan and to report yellow zone sxs to NP over the next 60days.   Start Date: 04/23/2021  Expected End Date: 06/27/2021  This Visit's Progress: On track  Recent Progress: On track  Priority: High  Note:    Update: 07/31/21  (Status: Goal Met.) Long Term Goal  Evaluation of current treatment plan related to FOLLOWING HF ACTION PLAN  and patient's adherence to plan as established by provider Pt weight is stable between 156-161#, was 160# today. She has minimal SOB and does have some edema which goes away over  night. She has not been in the [place] and knows when she should call her MD.  Update 06/27/21:  (Status: Goal on Track (progressing): YES.) Long Term Goal  Evaluation of current treatment plan related to FOLLOWING HF ACTION PLAN and patient's adherence to plan as established by provider Mrs. Allison Duffy reports her wt today was 165#, SOB occasionally on exertion, minimal edema L>R. She knows to call when she has an increase in any of these issues  Update 05/30/21:  (Status: Goal on Track (progressing): YES.) Long Term Goal  Evaluation of current treatment plan related to FOLLOWING HF ACTION PLAN and patient's adherence to plan as established by provider       Mrs. Allison Duffy reports her weight this am was 164#. She says she only get a little SOB with overexertion. She does have some LE edema but she has started wearing compression stockings and this has helped. She avoids adding salt and her daughter watches to buy low na food. She says she checks her BP only when she feels funny and she hasn't had to check it lately. Encouraged her to continue her self management efforts - DOING GREAT!  Update 04/30/21:  (Status: Goal on Track (progressing): YES.) Long Term Goal  Evaluation of current treatment plan related to HF management and patient's adherence to plan as established by provider      Mrs. Allison Duffy is following the HF Action plan: weighs daily, low salt diet, takes her meds, knows when to call MD.   Current Barriers:  Chronic Disease Management support and education needs related to CHF  RNCM Clinical Goal(s):  Patient will verbalize understanding of plan for management of CHF as evidenced by pt report and chart review. take all medications exactly as prescribed and will call provider for medication related questions as evidenced by pt report and chart review    attend all scheduled medical appointments: primary care and cardiology as evidenced by pt report and chart review.        through collaboration  with RN Care manager, provider, and care team.   Interventions: Inter-disciplinary care team collaboration (see longitudinal plan of care) Evaluation of current treatment plan related to  self management and patient's adherence to plan as established by provider  Patient Goals/Self-Care Activities: Take medications as prescribed   Attend all scheduled provider appointments Call pharmacy for medication refills 3-7 days in advance of running out of medications Call provider office for new concerns or questions       Problem: Diabetes self management   Priority: Medium  Onset Date: 04/23/2021     Long-Range Goal: Patient will check her glucose levels daily in am and 3 x a week in the evening over the next 30 days as reported by pt. (continued to July appt, changed to long term)   Start Date: 04/23/2021  Expected End Date: 07/03/2021  This Visit's Progress: On track  Recent Progress: On track  Priority: Medium  Note:    Update 07/31/21:  (Status: Goal Met.) Long Term Goal  Evaluation of current treatment plan related to GLUCOSE MONITORING and patient's adherence to plan as established by provider Mrs. [place] has been checking her glucose levels. She is still running high and out of range since her glipizide was discontinued due to hypoglycemia. Her levels have been between 221-300. She will see Dr. Reynaldo Duffy later this month.  Update 06/27/21:  (Status: Goal on Track (progressing): YES.) Long Term Goal  Evaluation of current treatment plan related to GLUCOSE MONITORING and patient's adherence to plan as established by provider Pt reports all her FBSs are running in mid 200's. Today it was 247. She was previously taken off glipizide due to her HgbA1C was down to 5.9.  She continues to take metformin 500 mg bid and januvia 100 mg daily. NP messaged Dr. Reynaldo Duffy to see if he would like to adjust her diabetes medications.  Update 05/30/21:  (Status: Goal on Track (progressing): YES.) Long Term Goal   EXTENDING GOAL UNTIL 06/27/21 CALL. Evaluation of current treatment plan related to MONITORING HER GLUCOSE LEVELS and patient's adherence to plan as established by provider       Mrs. [place] reports Dr. Reynaldo Duffy has discontinued her glipizide since some of her glucose levels have been low. She continues on Jardiance and Metformin. She reports she is checking her levels and they are higher. Her FBS and NFBS range between 179-234. Encouraged her continued monitoring, following her diet, daily foot checks and call MD/NP for any problems.  Update 04/30/21:  (Status: Goal on Track (progressing): YES.) Short Term Goal  Evaluation of current treatment plan related to DM  and patient's adherence to plan as established by provider       Pt checks her glucose every morning. She says it ranges 150-170. Her last HgbA1C was 5.9! She is nearly out of strips and plans on calling Dr. Reynaldo Duffy to have an Rx sent to her pharmacy. Encouraged her to check an occasional pm glucose.   Current Barriers:  Chronic Disease Management support and education needs related to DMII  RNCM Clinical Goal(s):  Patient will verbalize basic understanding of DMII disease process and self health management plan as evidenced by discussions with pt. take all medications exactly as prescribed and will call provider for medication related questions as evidenced by pt report.    attend all scheduled medical appointments: primary care providing DMII care as evidenced by pt report and chart review.        through collaboration with RN Care manager, provider, and care team.   Interventions: Inter-disciplinary care team collaboration (see longitudinal plan of care) Evaluation of current treatment plan related to  self management and patient's adherence to plan as established by provider  Patient Goals/Self-Care Activities: Take medications as prescribed   Attend all scheduled provider appointments Call pharmacy for medication refills 3-7 days in  advance of running out of medications Call provider office for new concerns or questions         No further follow up necessary. Pt has been instructed that she can call NP in the future with any problems.  Eulah Pont. Myrtie Neither, MSN, [facility] Gerontological Nurse Practitioner [facility] Care Management [phone]

## 2021-08-04 ENCOUNTER — Other Ambulatory Visit: Payer: Self-pay | Admitting: General Practice

## 2021-08-05 NOTE — Telephone Encounter (Signed)
Prescription refill request for Eliquis received. Indication: PAF Last office visit: 04/11/21  Adora Fridge MD Scr: 1.28 on 04/03/21 Age: 88 Weight: 77.9kg  Based on above findings Eliquis '5mg'$  twice daily is the appropriate dose.  Refill approved.

## 2021-08-21 DIAGNOSIS — E663 Overweight: Secondary | ICD-10-CM | POA: Diagnosis not present

## 2021-08-21 DIAGNOSIS — I700 Atherosclerosis of aorta: Secondary | ICD-10-CM | POA: Diagnosis not present

## 2021-08-21 DIAGNOSIS — I129 Hypertensive chronic kidney disease with stage 1 through stage 4 chronic kidney disease, or unspecified chronic kidney disease: Secondary | ICD-10-CM | POA: Diagnosis not present

## 2021-08-21 DIAGNOSIS — E1129 Type 2 diabetes mellitus with other diabetic kidney complication: Secondary | ICD-10-CM | POA: Diagnosis not present

## 2021-08-21 DIAGNOSIS — I509 Heart failure, unspecified: Secondary | ICD-10-CM | POA: Diagnosis not present

## 2021-08-21 DIAGNOSIS — I110 Hypertensive heart disease with heart failure: Secondary | ICD-10-CM | POA: Diagnosis not present

## 2021-08-21 DIAGNOSIS — F32A Depression, unspecified: Secondary | ICD-10-CM | POA: Diagnosis not present

## 2021-08-21 DIAGNOSIS — R5383 Other fatigue: Secondary | ICD-10-CM | POA: Diagnosis not present

## 2021-09-20 ENCOUNTER — Ambulatory Visit: Payer: Self-pay

## 2021-09-20 NOTE — Patient Instructions (Signed)
Visit Information  Thank you for taking time to visit with me today. Please don't hesitate to contact me if I can be of assistance to you.   Following are the goals we discussed today:   Goals Addressed             This Visit's Progress    COMPLETED: Care Coordination Activities       Care Coordination Interventions: [facility] screening completed - no acute resource needs identified at this time Performed chart review to note patient previously engaged with care coordination team Determined the patient is doing well and does not have care coordination needs at this time Instructed the patient to contact her primary care provider as needed        Please call the care guide team at [phone] if you need to schedule an appointment with our care coordination team.  If you are experiencing a Mental Health or [place] or need someone to talk to, please call [phone]-TALK (toll free, 24 hour hotline)  Patient verbalizes understanding of instructions and care plan provided today and agrees to view in [place]. Active MyChart status and patient understanding of how to access instructions and care plan via MyChart confirmed with patient.     No further follow up required: Please contact your primary care provider as needed  Daneen Schick, BSW, CDP Social Worker, Certified Dementia Practitioner Care Coordination [phone]

## 2021-09-20 NOTE — Patient Outreach (Signed)
  Care Coordination   Initial Visit Note   09/20/2021 Name: Allison Duffy MRN: [id number] DOB: Feb 29, 1926  Allison Duffy is a 88 y.o. year old female who sees Burnard Bunting, MD for primary care. I spoke with  Allison Duffy by phone today.  What matters to the patients health and wellness today?  No concerns, doing well at this time    Goals Addressed             This Visit's Progress    COMPLETED: Care Coordination Activities       Care Coordination Interventions: [facility]DoH screening completed - no acute resource needs identified at this time Performed chart review to note patient previously engaged with care coordination team Determined the patient is doing well and does not have care coordination needs at this time Instructed the patient to contact her primary care provider as needed        SDOH assessments and interventions completed:  Yes  SDOH Interventions Today    Flowsheet Row Most Recent Value  SDOH Interventions   Food Insecurity Interventions Intervention Not Indicated  Housing Interventions Intervention Not Indicated  Transportation Interventions Intervention Not Indicated        Care Coordination Interventions Activated:  Yes  Care Coordination Interventions:  Yes, provided   Follow up plan: No further intervention required.   Encounter Outcome:  Pt. Visit Completed   Daneen Schick, BSW, CDP Social Worker, Certified Dementia Practitioner Care Coordination [phone]

## 2021-09-26 ENCOUNTER — Other Ambulatory Visit: Payer: Self-pay | Admitting: Cardiovascular Disease

## 2021-09-26 DIAGNOSIS — I200 Unstable angina: Secondary | ICD-10-CM

## 2021-09-26 DIAGNOSIS — E1169 Type 2 diabetes mellitus with other specified complication: Secondary | ICD-10-CM

## 2021-09-26 DIAGNOSIS — I10 Essential (primary) hypertension: Secondary | ICD-10-CM

## 2021-09-26 MED ORDER — RANOLAZINE ER 500 MG PO TB12: ORAL_TABLET | ORAL | 3 refills | Status: DC

## 2021-09-26 NOTE — Addendum Note (Signed)
Addended by: Lubertha Sayres on: 09/26/2021 02:51 PM   Modules accepted: Orders

## 2021-09-26 NOTE — Telephone Encounter (Signed)
*  STAT* If patient is at the pharmacy, call can be transferred to refill team.   1. Which medications need to be refilled? (please list name of each medication and dose if known) need a new prescriptions for Rosuvastatin and Ranolazine  2. Which pharmacy/location (including street and city if local pharmacy) is medication to be sent to?[facility]  [address], [place]  3. Do they need a 30 day or 90 day supply? 90 days and refills

## 2021-10-02 DIAGNOSIS — D044 Carcinoma in situ of skin of scalp and neck: Secondary | ICD-10-CM | POA: Diagnosis not present

## 2021-10-02 DIAGNOSIS — D485 Neoplasm of uncertain behavior of skin: Secondary | ICD-10-CM | POA: Diagnosis not present

## 2021-10-02 DIAGNOSIS — Z85828 Personal history of other malignant neoplasm of skin: Secondary | ICD-10-CM | POA: Diagnosis not present

## 2021-10-02 DIAGNOSIS — D0439 Carcinoma in situ of skin of other parts of face: Secondary | ICD-10-CM | POA: Diagnosis not present

## 2021-10-08 ENCOUNTER — Encounter: Payer: Self-pay | Admitting: Cardiovascular Disease

## 2021-10-08 ENCOUNTER — Ambulatory Visit: Payer: Medicare Other | Attending: Cardiovascular Disease | Admitting: Cardiovascular Disease

## 2021-10-08 DIAGNOSIS — I200 Unstable angina: Secondary | ICD-10-CM

## 2021-10-08 DIAGNOSIS — I2583 Coronary atherosclerosis due to lipid rich plaque: Secondary | ICD-10-CM | POA: Insufficient documentation

## 2021-10-08 DIAGNOSIS — I2510 Atherosclerotic heart disease of native coronary artery without angina pectoris: Secondary | ICD-10-CM | POA: Insufficient documentation

## 2021-10-08 DIAGNOSIS — I480 Paroxysmal atrial fibrillation: Secondary | ICD-10-CM | POA: Insufficient documentation

## 2021-10-08 DIAGNOSIS — I10 Essential (primary) hypertension: Secondary | ICD-10-CM | POA: Insufficient documentation

## 2021-10-08 DIAGNOSIS — E785 Hyperlipidemia, unspecified: Secondary | ICD-10-CM | POA: Insufficient documentation

## 2021-10-08 DIAGNOSIS — E1169 Type 2 diabetes mellitus with other specified complication: Secondary | ICD-10-CM | POA: Insufficient documentation

## 2021-10-08 NOTE — Progress Notes (Signed)
10/08/2021 Allison Duffy   [birth year]  [id number]  Primary Physician Burnard Bunting, MD Primary Cardiologist: Lorretta Harp MD [name], [place]  HPI:  Allison Duffy is a 88 y.o.  mildly overweight widowed Caucasian female mother of 3 living daughters (one deceased), grandmother a grandchildren. Her daughter Allison Duffy accompanies her today who I have seen as a patient remotely as well.  I last saw her in the office 04/11/2021.Marland Kitchen  She was referred by Dr. Reynaldo Minium for cardiovascular evaluation because of new onset effort angina or shortness of breath and fatigue.her cardiac risk factor profile is notable for treated hypertension, diabetes and hyperlipidemia. She does not smoke. There is no family history. She has never had a heart attack or stroke. Over the last 6 months she's noticed new onset effort angina, dyspnea and fatigue. The symptoms do not occur at rest. They do not awaken her from sleep she had a 2D echo performed 07/20/2013 with cyst which was essentially normal as was a Myoview stress test back in.    She was admitted to the hospital 01/27/2019 with combined systolic and diastolic heart failure, atrial fibrillation.  She was diuresed, rate controlled and placed on oral anticoagulation.  She still complains of some dyspnea on exertion.  She weighs herself on a daily basis and avoid salt.  2D echo was performed that showed preserved LV function with moderate to severe mitral vegetation.     She performs her activities of daily living with minimal limitation.  She does live alone.  She still drives short distances and does her daily housework.  She gets occasionally short of breath but not so much that it affects her quality of life.   I did get a coronary CTA on her 04/11/2020 revealing a coronary calcium score 2614.  Based on this I performed cardiac catheterization on her 04/12/2020 revealing moderate segmental proximal LAD disease which I did not think was obstructive and  recommended medical therapy.  She really denies chest pain.  She was admitted to the hospital on 03/29/2021 with community-acquired pneumonia, sepsis and diastolic heart failure.  She was treated with antibiotics and diuresis.  Prior to that she did have a flareup of her colitis.  She is currently in a skilled nursing facility.  She is mildly volume overloaded with 1+ pitting edema.  Her daughter Allison Duffy does relate that the food is high in sodium.  Since I saw her 6 months ago she continues to do well.  She is trying to watch her sodium intake.  She is independent and drives, cooks for self and buys her own groceries and takes her medicines.  She denies chest pain or shortness of breath.   Current Meds  Medication Sig   amLODipine (NORVASC) 10 MG tablet Take 10 mg by mouth at bedtime.   carboxymethylcellulose 1 % ophthalmic solution Place 1 drop into both eyes 2 (two) times daily.   carvedilol (COREG) 12.5 MG tablet TAKE 1 TABLET BY MOUTH TWICE DAILY WITH A MEAL   cholecalciferol (VITAMIN D) 1000 UNITS tablet Take 1,000 Units by mouth daily with lunch.   cholestyramine (QUESTRAN) 4 g packet Take 4 g by mouth daily as needed (IBS).   cloNIDine (CATAPRES) 0.1 MG tablet TAKE 2 TABLET BY MOUTH EVERY MORNING AND 1 TABLET BY MOUTH EVERY NIGHT AT BEDTIME   ELIQUIS 5 MG TABS tablet TAKE 1 TABLET BY MOUTH TWICE DAILY   furosemide (LASIX) 40 MG tablet Take 1 tablet (40 mg  total) by mouth in the morning AND 0.5 tablets (20 mg total) every evening.   Lancets (ONETOUCH ULTRASOFT) lancets use to check blood sugar twice a day as directed   metFORMIN (GLUCOPHAGE) 500 MG tablet Take 1 tablet (500 mg total) by mouth 2 (two) times daily with a meal.   Multiple Vitamin (MULTIVITAMIN WITH MINERALS) TABS Take 1 tablet by mouth daily with supper.   olmesartan (BENICAR) 40 MG tablet Take 40 mg by mouth at bedtime.   omeprazole (PRILOSEC) 40 MG capsule TAKE 1 CAPSULE BY MOUTH DAILY WITH SUPPER   ONETOUCH ULTRA test strip  USE AS DIRECTED TO TEST BLOOD SUGAR TWICE DAILY   potassium chloride SA (KLOR-CON M) 20 MEQ tablet TAKE 1 TABLET BY MOUTH DAILY   Probiotic Product (PRO-BIOTIC BLEND) CAPS Take 1 capsule by mouth at bedtime. Takes OTC probiotic   ranolazine (RANEXA) 500 MG 12 hr tablet TAKE 1 TABLET(500 MG) BY MOUTH TWICE DAILY   rosuvastatin (CRESTOR) 20 MG tablet TAKE 1 TABLET(20 MG) BY MOUTH AT BEDTIME   sitaGLIPtin (JANUVIA) 100 MG tablet Take 100 mg by mouth daily with lunch.     Allergies  Allergen Reactions   Codeine Nausea And Vomiting   Hydrocodone     Nausea and vomiting   Prednisone     Other reaction(s): sick on her stomach   Sulfa Antibiotics Itching, Nausea Only and Other (See Comments)    Social History   Socioeconomic History   Marital status: Widowed    Spouse name: 4 children and 3 living   Number of children: 4   Years of education: Not on file   Highest education level: Not on file  Occupational History   Occupation: Retired  Tobacco Use   Smoking status: Former    Years: 5.00    Types: Cigarettes    Quit date: 07/05/1953    Years since quitting: 68.3   Smokeless tobacco: Never  Vaping Use   Vaping Use: Never used  Substance and Sexual Activity   Alcohol use: No   Drug use: No   Sexual activity: Not Currently  Other Topics Concern   Not on file  Social History Narrative   Lives independently in her own home. Daughter, Allison Duffy, is 5 min. away and her grandson lives next door.   Social Determinants of Health   Financial Resource Strain: Not on file  Food Insecurity: No Food Insecurity (09/20/2021)   Hunger Vital Sign    Worried About Running Out of Food in the Last Year: Never true    Ran Out of Food in the Last Year: Never true  Transportation Needs: No Transportation Needs (09/20/2021)   PRAPARE - Hydrologist (Medical): No    Lack of Transportation (Non-Medical): No  Physical Activity: Not on file  Stress: Not on file  Social  Connections: Not on file  Intimate Partner Violence: Not on file     Review of Systems: General: negative for chills, fever, night sweats or weight changes.  Cardiovascular: negative for chest pain, dyspnea on exertion, edema, orthopnea, palpitations, paroxysmal nocturnal dyspnea or shortness of breath Dermatological: negative for rash Respiratory: negative for cough or wheezing Urologic: negative for hematuria Abdominal: negative for nausea, vomiting, diarrhea, bright red blood per rectum, melena, or hematemesis Neurologic: negative for visual changes, syncope, or dizziness All other systems reviewed and are otherwise negative except as noted above.    Blood pressure 130/68, pulse (!) 52, height '5\' 6"'$  (1.676 m), weight 156  lb 6.4 oz (70.9 kg), SpO2 98 %.  General appearance: alert and no distress Neck: no adenopathy, no carotid bruit, no JVD, supple, symmetrical, trachea midline, and thyroid not enlarged, symmetric, no tenderness/mass/nodules Lungs: clear to auscultation bilaterally Heart: irregularly irregular rhythm Extremities: 1+ lower extremity edema Pulses: 2+ and symmetric Skin: Skin color, texture, turgor normal. No rashes or lesions Neurologic: Grossly normal  EKG atrial flutter with ventricular sponsor 52 and septal Q waves.  I personally reviewed this EKG.  ASSESSMENT AND PLAN:   Essential hypertension History of essential hypertension blood pressure measured today at 130/68.  She is on amlodipine and carvedilol and clonidine as well as Benicar.  Hyperlipidemia associated with type 2 diabetes mellitus ([place]) History of hyperlipidemia on statin therapy with lipid profile performed 04/12/2020 revealing total cholesterol 124, LDL 56 and HDL 37.  Paroxysmal atrial fibrillation (HCC) History of persistent atrial fibrillation rate controlled on Eliquis oral anticoagulation.  Coronary artery disease History of CAD status post cardiac catheterization performed by myself  after coronary calcium score was measured at 2614 on 04/11/2020.  Cath showed moderate segmental proximal LAD disease which I did not think was obstructive and I recommended medical therapy.  She denies chest pain.     Lorretta Harp MD FACP,FACC,FAHA, [facility] 10/08/2021 11:21 AM

## 2021-10-08 NOTE — Assessment & Plan Note (Signed)
History of essential hypertension blood pressure measured today at 130/68.  She is on amlodipine and carvedilol and clonidine as well as Benicar.

## 2021-10-08 NOTE — Patient Instructions (Signed)
Medication Instructions:  Your physician recommends that you continue on your current medications as directed. Please refer to the Current Medication list given to you today.  *If you need a refill on your cardiac medications before your next appointment, please call your pharmacy*   Follow-Up: At [facility], you and your health needs are our priority.  As part of our continuing mission to provide you with exceptional heart care, we have created designated Provider Care Teams.  These Care Teams include your primary Cardiologist (physician) and Advanced Practice Providers (APPs -  Physician Assistants and Nurse Practitioners) who all work together to provide you with the care you need, when you need it.  We recommend signing up for the patient portal called "MyChart".  Sign up information is provided on this After Visit Summary.  MyChart is used to connect with patients for Virtual Visits (Telemedicine).  Patients are able to view lab/test results, encounter notes, upcoming appointments, etc.  Non-urgent messages can be sent to your provider as well.   To learn more about what you can do with MyChart, go to [email or web address].    Your next appointment:   12 month(s)  The format for your next appointment:   In Person  Provider:   Jonathan Berry, MD   

## 2021-10-08 NOTE — Assessment & Plan Note (Signed)
History of persistent atrial fibrillation rate controlled on Eliquis oral anticoagulation. 

## 2021-10-08 NOTE — Assessment & Plan Note (Signed)
History of hyperlipidemia on statin therapy with lipid profile performed 04/12/2020 revealing total cholesterol 124, LDL 56 and HDL 37.

## 2021-10-08 NOTE — Assessment & Plan Note (Signed)
History of CAD status post cardiac catheterization performed by myself after coronary calcium score was measured at 2614 on 04/11/2020.  Cath showed moderate segmental proximal LAD disease which I did not think was obstructive and I recommended medical therapy.  She denies chest pain.

## 2021-10-19 ENCOUNTER — Emergency Department (HOSPITAL_BASED_OUTPATIENT_CLINIC_OR_DEPARTMENT_OTHER)
Admission: EM | Admit: 2021-10-19 | Discharge: 2021-10-19 | Disposition: A | Discharge status: Home / Self Care | Payer: Medicare Other | Attending: Emergency Medicine | Admitting: Emergency Medicine

## 2021-10-19 ENCOUNTER — Encounter (HOSPITAL_BASED_OUTPATIENT_CLINIC_OR_DEPARTMENT_OTHER): Payer: Self-pay

## 2021-10-19 ENCOUNTER — Other Ambulatory Visit: Payer: Self-pay

## 2021-10-19 DIAGNOSIS — Z7984 Long term (current) use of oral hypoglycemic drugs: Secondary | ICD-10-CM | POA: Insufficient documentation

## 2021-10-19 DIAGNOSIS — S81812A Laceration without foreign body, left lower leg, initial encounter: Secondary | ICD-10-CM | POA: Diagnosis not present

## 2021-10-19 DIAGNOSIS — Z794 Long term (current) use of insulin: Secondary | ICD-10-CM | POA: Diagnosis not present

## 2021-10-19 DIAGNOSIS — X58XXXA Exposure to other specified factors, initial encounter: Secondary | ICD-10-CM | POA: Diagnosis not present

## 2021-10-19 DIAGNOSIS — E1140 Type 2 diabetes mellitus with diabetic neuropathy, unspecified: Secondary | ICD-10-CM | POA: Diagnosis not present

## 2021-10-19 DIAGNOSIS — Z7901 Long term (current) use of anticoagulants: Secondary | ICD-10-CM | POA: Diagnosis not present

## 2021-10-19 MED ORDER — LIDOCAINE-EPINEPHRINE-TETRACAINE (LET) TOPICAL GEL
3.0000 mL | Freq: Once | TOPICAL | Status: AC
  Administered 2021-10-19: 3 mL via TOPICAL
  Filled 2021-10-19: qty 3

## 2021-10-19 NOTE — ED Provider Notes (Signed)
[facility] EMERGENCY DEPARTMENT Provider Note   CSN: [id number] Arrival date & time: 10/19/21  1933     History {Add pertinent medical, surgical, social history, OB history to HPI:1} Chief Complaint  Patient presents with   Leg Swelling   Wound Check    Allison Duffy is a 88 y.o. female.  Who presents emergency department for evaluation of left lower extremity wound.  Patient has a history of A-fib on Eliquis, peripheral edema, and neuropathy.  Patient did not remember injuring her leg today but has an injury to the left shin.  Her daughter came over to see her and noticed that there was bleeding and serous drainage from the anterior shin.  She states that her sock was soaked with clear liquid and blood.  They wrapped the wound several times without improvement it continues to drain.   Wound Check       Home Medications Prior to Admission medications   Medication Sig Start Date End Date Taking? Authorizing Provider  amLODipine (NORVASC) 10 MG tablet Take 10 mg by mouth at bedtime.    [provider]  carboxymethylcellulose 1 % ophthalmic solution Place 1 drop into both eyes 2 (two) times daily.    [provider]  carvedilol (COREG) 12.5 MG tablet TAKE 1 TABLET BY MOUTH TWICE DAILY WITH A MEAL 04/18/21   Lorretta Harp, MD  cholecalciferol (VITAMIN D) 1000 UNITS tablet Take 1,000 Units by mouth daily with lunch.    [provider]  cholestyramine (QUESTRAN) 4 g packet Take 4 g by mouth daily as needed (IBS).    [provider]  cloNIDine (CATAPRES) 0.1 MG tablet TAKE 2 TABLET BY MOUTH EVERY MORNING AND 1 TABLET BY MOUTH EVERY NIGHT AT BEDTIME 07/05/21   Lorretta Harp, MD  ELIQUIS 5 MG TABS tablet TAKE 1 TABLET BY MOUTH TWICE DAILY 08/05/21   Lorretta Harp, MD  furosemide (LASIX) 40 MG tablet Take 1 tablet (40 mg total) by mouth in the morning AND 0.5 tablets (20 mg total) every evening. 04/11/21   Lorretta Harp, MD   Lancets [facility] ULTRASOFT) lancets use to check blood sugar twice a day as directed 12/18/11   [provider]  metFORMIN (GLUCOPHAGE) 500 MG tablet Take 1 tablet (500 mg total) by mouth 2 (two) times daily with a meal. 04/15/20   Leonie Man, MD  Multiple Vitamin (MULTIVITAMIN WITH MINERALS) TABS Take 1 tablet by mouth daily with supper.    [provider]  nitroGLYCERIN (NITROSTAT) 0.4 MG SL tablet PLACE 1 TABLET (0.4 MG TOTAL) UNDER THE TONGUE EVERY FIVE MINUTES X 3 DOSES AS NEEDED FOR CHEST PAIN. Patient taking differently: Place 0.4 mg under the tongue every 5 (five) minutes as needed for chest pain. 04/13/20 04/13/21  Leonie Man, MD  olmesartan (BENICAR) 40 MG tablet Take 40 mg by mouth at bedtime.    Burnard Bunting, MD  omeprazole (PRILOSEC) 40 MG capsule TAKE 1 CAPSULE BY MOUTH DAILY WITH SUPPER 04/23/21   Lorretta Harp, MD  [facility] ULTRA test strip USE AS DIRECTED TO TEST BLOOD SUGAR TWICE DAILY 06/29/19   [provider]  potassium chloride SA (KLOR-CON M) 20 MEQ tablet TAKE 1 TABLET BY MOUTH DAILY 04/18/21   Lorretta Harp, MD  Probiotic Product (PRO-BIOTIC BLEND) CAPS Take 1 capsule by mouth at bedtime. Takes OTC probiotic    Burnard Bunting, MD  ranolazine (RANEXA) 500 MG 12 hr tablet TAKE 1 TABLET(500 MG)  BY MOUTH TWICE DAILY 09/26/21   Lorretta Harp, MD  rosuvastatin (CRESTOR) 20 MG tablet TAKE 1 TABLET(20 MG) BY MOUTH AT BEDTIME 07/05/21   Lorretta Harp, MD  sitaGLIPtin (JANUVIA) 100 MG tablet Take 100 mg by mouth daily with lunch.    [provider]      Allergies    Codeine, Hydrocodone, Prednisone, and Sulfa antibiotics    Review of Systems   Review of Systems  Physical Exam Updated Vital Signs BP (!) 188/69 (BP Location: Right Arm)   Pulse 62   Temp 98.4 F (36.9 C) (Oral)   Resp 18   SpO2 97%  Physical Exam Vitals and nursing note reviewed.  Constitutional:      General: She is not in acute distress.     Appearance: She is well-developed. She is not diaphoretic.  HENT:     Head: Normocephalic and atraumatic.     Right Ear: External ear normal.     Left Ear: External ear normal.     Nose: Nose normal.     Mouth/Throat:     Mouth: Mucous membranes are moist.  Eyes:     General: No scleral icterus.    Conjunctiva/sclera: Conjunctivae normal.  Cardiovascular:     Rate and Rhythm: Normal rate and regular rhythm.     Heart sounds: Normal heart sounds. No murmur heard.    No friction rub. No gallop.  Pulmonary:     Effort: Pulmonary effort is normal. No respiratory distress.     Breath sounds: Normal breath sounds.  Abdominal:     General: Bowel sounds are normal. There is no distension.     Palpations: Abdomen is soft. There is no mass.     Tenderness: There is no abdominal tenderness. There is no guarding.  Musculoskeletal:     Cervical back: Normal range of motion.     Comments: Bilateral lower extremity pitting edema.  There is a 1-1/2 cm very small tear of the anterior shin.  There is active serous drainage and clot formation at the site of the wound.  No active hemorrhage.  No significant tenderness.  Skin:    General: Skin is warm and dry.  Neurological:     Mental Status: She is alert and oriented to person, place, and time.  Psychiatric:        Behavior: Behavior normal.     ED Results / Procedures / Treatments   Labs (all labs ordered are listed, but only abnormal results are displayed) Labs Reviewed - No data to display  EKG None  Radiology No results found.  Procedures Procedures  {Document cardiac monitor, telemetry assessment procedure when appropriate:1}  Medications Ordered in ED Medications  lidocaine-EPINEPHrine-tetracaine (LET) topical gel (has no administration in time range)    ED Course/ Medical Decision Making/ A&P                           Medical Decision Making  ***  {Document critical care time when appropriate:1} {Document review of  labs and clinical decision tools ie heart score, Chads2Vasc2 etc:1}  {Document your independent review of radiology images, and any outside records:1} {Document your discussion with family members, caretakers, and with consultants:1} {Document social determinants of health affecting pt's care:1} {Document your decision making why or why not admission, treatments were needed:1} Final Clinical Impression(s) / ED Diagnoses Final diagnoses:  None    Rx / DC Orders ED Discharge Orders  None       

## 2021-10-19 NOTE — ED Triage Notes (Signed)
Pt daughter noticed that pt has a wound on LLE that is oozing serosanguinous fluid; drainage still present with no foul odor. Pt is diabetic and has neuropathy so unable to feel the wound. Pt LLE noted to have pitting edema; unable to palpate pedal pulse. Cap refill <3. Took Lasix today. Pt states "I just don't feel good". Unable to elaborate. Denies fever, N/V/D.

## 2021-10-19 NOTE — Discharge Instructions (Signed)
Do not scratch, rub, or pick at the adhesive. Leave tissue adhesive in place. It will come off naturally after 7-10 days. Do not place tape over the adhesive. The adhesive could come off the wound when you pull the tape off. Protect the wound from further injury until it is healed. Check your wound area every day for signs of infection. Check for: More redness, swelling, or pain,Fluid or blood,Warmth, Pus or a bad smell. Do not take baths, swim, or use a hot tub until your health care provider approves. You may only be allowed to take sponge baths. Ask your health care provider if you may take showers.You can usually shower after the first 24 hours. Cover the dressing with a watertight covering when you take a shower. Do not soak the area where there is tissue adhesive. Do not use any soaps, petroleum jelly products, or ointments on the wound. Certain ointments can weaken the adhesive.

## 2021-10-28 DIAGNOSIS — I110 Hypertensive heart disease with heart failure: Secondary | ICD-10-CM | POA: Diagnosis not present

## 2021-10-28 DIAGNOSIS — I872 Venous insufficiency (chronic) (peripheral): Secondary | ICD-10-CM | POA: Diagnosis not present

## 2021-10-28 DIAGNOSIS — S81812A Laceration without foreign body, left lower leg, initial encounter: Secondary | ICD-10-CM | POA: Diagnosis not present

## 2021-11-04 DIAGNOSIS — C44329 Squamous cell carcinoma of skin of other parts of face: Secondary | ICD-10-CM | POA: Diagnosis not present

## 2021-11-04 DIAGNOSIS — Z85828 Personal history of other malignant neoplasm of skin: Secondary | ICD-10-CM | POA: Diagnosis not present

## 2021-11-07 ENCOUNTER — Emergency Department (HOSPITAL_COMMUNITY)
Admission: EM | Admit: 2021-11-07 | Discharge: 2021-11-07 | Disposition: A | Discharge status: Home / Self Care | Payer: Medicare Other | Attending: Emergency Medicine | Admitting: Emergency Medicine

## 2021-11-07 ENCOUNTER — Emergency Department (HOSPITAL_COMMUNITY): Payer: Medicare Other

## 2021-11-07 DIAGNOSIS — L03211 Cellulitis of face: Secondary | ICD-10-CM | POA: Insufficient documentation

## 2021-11-07 DIAGNOSIS — Z79899 Other long term (current) drug therapy: Secondary | ICD-10-CM | POA: Insufficient documentation

## 2021-11-07 DIAGNOSIS — R42 Dizziness and giddiness: Secondary | ICD-10-CM | POA: Diagnosis not present

## 2021-11-07 DIAGNOSIS — R519 Headache, unspecified: Secondary | ICD-10-CM | POA: Diagnosis not present

## 2021-11-07 DIAGNOSIS — I10 Essential (primary) hypertension: Secondary | ICD-10-CM | POA: Insufficient documentation

## 2021-11-07 DIAGNOSIS — Z7901 Long term (current) use of anticoagulants: Secondary | ICD-10-CM | POA: Insufficient documentation

## 2021-11-07 DIAGNOSIS — R221 Localized swelling, mass and lump, neck: Secondary | ICD-10-CM | POA: Diagnosis not present

## 2021-11-07 DIAGNOSIS — Z1152 Encounter for screening for COVID-19: Secondary | ICD-10-CM | POA: Diagnosis not present

## 2021-11-07 DIAGNOSIS — R079 Chest pain, unspecified: Secondary | ICD-10-CM | POA: Diagnosis not present

## 2021-11-07 DIAGNOSIS — R001 Bradycardia, unspecified: Secondary | ICD-10-CM | POA: Diagnosis not present

## 2021-11-07 DIAGNOSIS — R220 Localized swelling, mass and lump, head: Secondary | ICD-10-CM | POA: Diagnosis not present

## 2021-11-07 LAB — COMPREHENSIVE METABOLIC PANEL
ALT: 46 U/L — ABNORMAL HIGH (ref 0–44)
AST: 99 U/L — ABNORMAL HIGH (ref 15–41)
Albumin: 3.8 g/dL (ref 3.5–5.0)
Alkaline Phosphatase: 45 U/L (ref 38–126)
Anion gap: 14 (ref 5–15)
BUN: 27 mg/dL — ABNORMAL HIGH (ref 8–23)
CO2: 22 mmol/L (ref 22–32)
Calcium: 9.2 mg/dL (ref 8.9–10.3)
Chloride: 102 mmol/L (ref 98–111)
Creatinine, Ser: 1.3 mg/dL — ABNORMAL HIGH (ref 0.44–1.00)
GFR, Estimated: 38 mL/min — ABNORMAL LOW (ref >60)
Glucose, Bld: 164 mg/dL — ABNORMAL HIGH (ref 70–99)
Potassium: 3.4 mmol/L — ABNORMAL LOW (ref 3.5–5.1)
Sodium: 138 mmol/L (ref 135–145)
Total Bilirubin: 0.8 mg/dL (ref 0.3–1.2)
Total Protein: 6.7 g/dL (ref 6.5–8.1)

## 2021-11-07 LAB — URINALYSIS, ROUTINE W REFLEX MICROSCOPIC
Bacteria, UA: NONE SEEN
Bilirubin Urine: NEGATIVE
Glucose, UA: NEGATIVE mg/dL
Hgb urine dipstick: NEGATIVE
Ketones, ur: NEGATIVE mg/dL
Leukocytes,Ua: NEGATIVE
Nitrite: NEGATIVE
Protein, ur: 30 mg/dL — AB
Specific Gravity, Urine: 1.008 (ref 1.005–1.030)
pH: 6 (ref 5.0–8.0)

## 2021-11-07 LAB — CBC WITH DIFFERENTIAL/PLATELET
Abs Immature Granulocytes: 0.02 10*3/uL (ref 0.00–0.07)
Basophils Absolute: 0 10*3/uL (ref 0.0–0.1)
Basophils Relative: 1 %
Eosinophils Absolute: 0.1 10*3/uL (ref 0.0–0.5)
Eosinophils Relative: 1 %
HCT: 35.8 % — ABNORMAL LOW (ref 36.0–46.0)
Hemoglobin: 12.7 g/dL (ref 12.0–15.0)
Immature Granulocytes: 0 %
Lymphocytes Relative: 24 %
Lymphs Abs: 2.1 10*3/uL (ref 0.7–4.0)
MCH: 34.8 pg — ABNORMAL HIGH (ref 26.0–34.0)
MCHC: 35.5 g/dL (ref 30.0–36.0)
MCV: 98.1 fL (ref 80.0–100.0)
Monocytes Absolute: 0.7 10*3/uL (ref 0.1–1.0)
Monocytes Relative: 8 %
Neutro Abs: 5.5 10*3/uL (ref 1.7–7.7)
Neutrophils Relative %: 66 %
Platelets: 192 10*3/uL (ref 150–400)
RBC: 3.65 MIL/uL — ABNORMAL LOW (ref 3.87–5.11)
RDW: 11.9 % (ref 11.5–15.5)
WBC: 8.4 10*3/uL (ref 4.0–10.5)
nRBC: 0 % (ref 0.0–0.2)

## 2021-11-07 LAB — TROPONIN I (HIGH SENSITIVITY)
Troponin I (High Sensitivity): 19 ng/L — ABNORMAL HIGH (ref <18)
Troponin I (High Sensitivity): 21 ng/L — ABNORMAL HIGH (ref <18)

## 2021-11-07 LAB — SARS CORONAVIRUS 2 BY RT PCR: SARS Coronavirus 2 by RT PCR: NEGATIVE

## 2021-11-07 LAB — CBG MONITORING, ED: Glucose-Capillary: 84 mg/dL (ref 70–99)

## 2021-11-07 MED ORDER — CLONIDINE HCL 0.1 MG PO TABS
0.1000 mg | ORAL_TABLET | Freq: Once | ORAL | Status: AC
  Administered 2021-11-07: 0.1 mg via ORAL
  Filled 2021-11-07: qty 1

## 2021-11-07 MED ORDER — CLINDAMYCIN HCL 300 MG PO CAPS: 300.0000 mg | ORAL_CAPSULE | Freq: Three times a day (TID) | ORAL | 0 refills | Status: DC

## 2021-11-07 MED ORDER — IOHEXOL 350 MG/ML SOLN
60.0000 mL | Freq: Once | INTRAVENOUS | Status: AC | PRN
  Administered 2021-11-07: 60 mL via INTRAVENOUS

## 2021-11-07 MED ORDER — SODIUM CHLORIDE 0.9 % IV BOLUS
500.0000 mL | Freq: Once | INTRAVENOUS | Status: AC
  Administered 2021-11-07: 500 mL via INTRAVENOUS

## 2021-11-07 MED ORDER — AMLODIPINE BESYLATE 5 MG PO TABS
10.0000 mg | ORAL_TABLET | Freq: Once | ORAL | Status: AC
  Administered 2021-11-07: 10 mg via ORAL
  Filled 2021-11-07: qty 2

## 2021-11-07 NOTE — ED Provider Notes (Signed)
[facility] EMERGENCY DEPARTMENT Provider Note   CSN: [id number] Arrival date & time: 11/07/21  1711     History  Chief Complaint  Patient presents with   Hypertension   Dizziness   Weakness    Allison Duffy is a 88 y.o. female history of hypertension, recurrent UTI here presenting with dizziness and weakness.  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  She had a surgery done to remove the carcinoma.  She is postop day #4.  She has been on doxycycline.  She states that after she finished the procedure, she has not been feeling well.  She states that she feels very dizzy.  She also feels that her left face is swollen.  She has been falling and noticed a bruise on the left hip.  She also has chills as well.  Moreover she has been having some chest pain.  She checked her blood pressure and was elevated at the 190s.  The history is provided by the patient.       Home Medications Prior to Admission medications   Medication Sig Start Date End Date Taking? Authorizing Provider  clindamycin (CLEOCIN) 300 MG capsule Take 1 capsule (300 mg total) by mouth 3 (three) times daily. 11/07/21  Yes Drenda Freeze, MD  amLODipine (NORVASC) 10 MG tablet Take 10 mg by mouth at bedtime.    [provider]  carboxymethylcellulose 1 % ophthalmic solution Place 1 drop into both eyes 2 (two) times daily.    [provider]  carvedilol (COREG) 12.5 MG tablet TAKE 1 TABLET BY MOUTH TWICE DAILY WITH A MEAL 04/18/21   Lorretta Harp, MD  cholecalciferol (VITAMIN D) 1000 UNITS tablet Take 1,000 Units by mouth daily with lunch.    [provider]  cholestyramine (QUESTRAN) 4 g packet Take 4 g by mouth daily as needed (IBS).    [provider]  cloNIDine (CATAPRES) 0.1 MG tablet TAKE 2 TABLET BY MOUTH EVERY MORNING AND 1 TABLET BY MOUTH EVERY NIGHT AT BEDTIME 07/05/21   Lorretta Harp, MD  ELIQUIS 5 MG TABS tablet TAKE 1  TABLET BY MOUTH TWICE DAILY 08/05/21   Lorretta Harp, MD  furosemide (LASIX) 40 MG tablet Take 1 tablet (40 mg total) by mouth in the morning AND 0.5 tablets (20 mg total) every evening. 04/11/21   Lorretta Harp, MD  Lancets [facility] ULTRASOFT) lancets use to check blood sugar twice a day as directed 12/18/11   [provider]  metFORMIN (GLUCOPHAGE) 500 MG tablet Take 1 tablet (500 mg total) by mouth 2 (two) times daily with a meal. 04/15/20   Leonie Man, MD  Multiple Vitamin (MULTIVITAMIN WITH MINERALS) TABS Take 1 tablet by mouth daily with supper.    [provider]  nitroGLYCERIN (NITROSTAT) 0.4 MG SL tablet PLACE 1 TABLET (0.4 MG TOTAL) UNDER THE TONGUE EVERY FIVE MINUTES X 3 DOSES AS NEEDED FOR CHEST PAIN. Patient taking differently: Place 0.4 mg under the tongue every 5 (five) minutes as needed for chest pain. 04/13/20 04/13/21  Leonie Man, MD  olmesartan (BENICAR) 40 MG tablet Take 40 mg by mouth at bedtime.    Burnard Bunting, MD  omeprazole (PRILOSEC) 40 MG capsule TAKE 1 CAPSULE BY MOUTH DAILY WITH SUPPER 04/23/21   Lorretta Harp, MD  [facility] ULTRA test strip USE AS DIRECTED TO TEST BLOOD SUGAR TWICE DAILY 06/29/19   [provider]  potassium chloride SA (  KLOR-CON M) 20 MEQ tablet TAKE 1 TABLET BY MOUTH DAILY 04/18/21   Lorretta Harp, MD  Probiotic Product (PRO-BIOTIC BLEND) CAPS Take 1 capsule by mouth at bedtime. Takes OTC probiotic    Burnard Bunting, MD  ranolazine (RANEXA) 500 MG 12 hr tablet TAKE 1 TABLET(500 MG) BY MOUTH TWICE DAILY 09/26/21   Lorretta Harp, MD  rosuvastatin (CRESTOR) 20 MG tablet TAKE 1 TABLET(20 MG) BY MOUTH AT BEDTIME 07/05/21   Lorretta Harp, MD  sitaGLIPtin (JANUVIA) 100 MG tablet Take 100 mg by mouth daily with lunch.    [provider]      Allergies    Codeine, Hydrocodone, Prednisone, and Sulfa antibiotics    Review of Systems   Review of Systems  Cardiovascular:  Positive for chest  pain.  Neurological:  Positive for dizziness and weakness.  All other systems reviewed and are negative.   Physical Exam Updated Vital Signs BP (!) 162/79   Pulse (!) 57   Temp 98 F (36.7 C) (Oral)   Resp (!) 21   SpO2 95%  Physical Exam Vitals and nursing note reviewed.  Constitutional:      Comments: Chronic ill, uncomfortable  HENT:     Head: Normocephalic.     Nose: Nose normal.     Mouth/Throat:     Mouth: Mucous membranes are dry.     Comments: Left side of the face with stitches in place.  Slightly swollen.  No obvious erythema. Eyes:     Extraocular Movements: Extraocular movements intact.     Pupils: Pupils are equal, round, and reactive to light.  Neck:     Comments: Patient has some tender left-sided lymphadenopathy Cardiovascular:     Rate and Rhythm: Normal rate and regular rhythm.     Pulses: Normal pulses.     Heart sounds: Normal heart sounds.  Pulmonary:     Effort: Pulmonary effort is normal.     Breath sounds: Normal breath sounds.  Abdominal:     General: Abdomen is flat.     Palpations: Abdomen is soft.  Musculoskeletal:        General: Normal range of motion.     Cervical back: Normal range of motion and neck supple.     Comments: Bruising of the left hip area.  No obvious deformity  Skin:    General: Skin is warm.  Neurological:     General: No focal deficit present.     Mental Status: She is alert and oriented to person, place, and time.  Psychiatric:        Mood and Affect: Mood normal.        Behavior: Behavior normal.     ED Results / Procedures / Treatments   Labs (all labs ordered are listed, but only abnormal results are displayed) Labs Reviewed  CBC WITH DIFFERENTIAL/PLATELET - Abnormal; Notable for the following components:      Result Value   RBC 3.65 (*)    HCT 35.8 (*)    MCH 34.8 (*)    All other components within normal limits  COMPREHENSIVE METABOLIC PANEL - Abnormal; Notable for the following components:    Potassium 3.4 (*)    Glucose, Bld 164 (*)    BUN 27 (*)    Creatinine, Ser 1.30 (*)    AST 99 (*)    ALT 46 (*)    GFR, Estimated 38 (*)    All other components within normal limits  URINALYSIS, ROUTINE W REFLEX  MICROSCOPIC - Abnormal; Notable for the following components:   Color, Urine STRAW (*)    Protein, ur 30 (*)    All other components within normal limits  TROPONIN I (HIGH SENSITIVITY) - Abnormal; Notable for the following components:   Troponin I (High Sensitivity) 19 (*)    All other components within normal limits  TROPONIN I (HIGH SENSITIVITY) - Abnormal; Notable for the following components:   Troponin I (High Sensitivity) 21 (*)    All other components within normal limits  SARS CORONAVIRUS 2 BY RT PCR  CBG MONITORING, ED    EKG EKG Interpretation  Date/Time:  Thursday November 07 2021 17:47:53 EDT Ventricular Rate:  56 PR Interval:    QRS Duration: 102 QT Interval:  494 QTC Calculation: 477 R Axis:   -18 Text Interpretation: Atrial flutter with predominant 4:1 AV block Borderline left axis deviation aflutter unchanged since previous Confirmed by Wandra Arthurs [phone]) on 11/07/2021 6:13:20 PM  Radiology CT HEAD WO CONTRAST (5MM)  Result Date: 11/07/2021 CLINICAL DATA:  Headache EXAM: CT HEAD WITHOUT CONTRAST TECHNIQUE: Contiguous axial images were obtained from the base of the skull through the vertex without intravenous contrast. RADIATION DOSE REDUCTION: This exam was performed according to the departmental dose-optimization program which includes automated exposure control, adjustment of the mA and/or kV according to patient size and/or use of iterative reconstruction technique. COMPARISON:  01/26/2018 FINDINGS: Brain: There is no mass, hemorrhage or extra-axial collection. There is generalized atrophy without lobar predilection. Hypodensity of the white matter is most commonly associated with chronic microvascular disease. Old left cerebellar infarct. Vascular:  No abnormal hyperdensity of the major intracranial arteries or dural venous sinuses. No intracranial atherosclerosis. Skull: The visualized skull base, calvarium and extracranial soft tissues are normal. Sinuses/Orbits: No fluid levels or advanced mucosal thickening of the visualized paranasal sinuses. No mastoid or middle ear effusion. The orbits are normal. IMPRESSION: 1. No acute intracranial abnormality. 2. Generalized atrophy and findings of chronic microvascular disease. 3. Old left cerebellar infarct. Electronically Signed   By: Ulyses Jarred M.D.   On: 11/07/2021 20:24   CT Angio Chest/Abd/Pel for Dissection W and/or Wo Contrast  Result Date: 11/07/2021 CLINICAL DATA:  Acute aortic syndrome suspected. Hypertension, weakness, dizziness, generalized malaise for 1 week. High blood pressure. EXAM: CT ANGIOGRAPHY CHEST, ABDOMEN AND PELVIS TECHNIQUE: Non-contrast CT of the chest was initially obtained. Multidetector CT imaging through the chest, abdomen and pelvis was performed using the standard protocol during bolus administration of intravenous contrast. Multiplanar reconstructed images and MIPs were obtained and reviewed to evaluate the vascular anatomy. RADIATION DOSE REDUCTION: This exam was performed according to the departmental dose-optimization program which includes automated exposure control, adjustment of the mA and/or kV according to patient size and/or use of iterative reconstruction technique. CONTRAST:  53m OMNIPAQUE IOHEXOL 350 MG/ML SOLN COMPARISON:  Cardiac CT 04/11/2020.  CT chest 01/27/2019 FINDINGS: CTA CHEST FINDINGS Cardiovascular: Noncontrast images of the chest demonstrate surgical clips around the left thyroid gland. Calcification of the aorta and coronary arteries. Calcified granulomas in the spleen. Calcified lymph nodes in the mediastinum. No evidence of intramural hematoma. Normal caliber thoracic aorta. No evidence of aortic aneurysm or dissection. 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 Normal heart size. No pericardial effusions. Visualized central pulmonary arteries are patent. No evidence of large central pulmonary embolus. Mediastinum/Nodes: Partial resection of the left thyroid gland. No thyroid nodules. No  significant lymphadenopathy. Esophagus is decompressed. Lungs/Pleura: Emphysematous changes and scattered subpleural fibrosis in the lungs. Mild atelectasis in the lung bases. No consolidation or airspace disease. No pleural effusions. No pneumothorax. Airways are patent. Musculoskeletal: Degenerative changes in the spine and left shoulder. Previous right shoulder arthroplasty. No acute bony abnormalities. Review of the MIP images confirms the above findings. CTA ABDOMEN AND PELVIS FINDINGS VASCULAR Aorta: Calcification of the aorta. Abdominal aorta is patent without evidence of aneurysm or dissection. Celiac: Patent without evidence of aneurysm, dissection, vasculitis or significant stenosis. SMA: Patent without evidence of aneurysm, dissection, vasculitis or significant stenosis. Renals: Single renal arteries bilaterally are patent without aneurysm or dissection. Calcification at the origins. Nephrograms are symmetrical. IMA: Patent without evidence of aneurysm, dissection, vasculitis or significant stenosis. Inflow: Patent without evidence of aneurysm, dissection, vasculitis or significant stenosis. Veins: No obvious venous abnormality within the limitations of this arterial phase study. Review of the MIP images confirms the above findings. NON-VASCULAR Hepatobiliary: Surgical absence of the gallbladder. Prominent intra and extrahepatic bile duct dilatation is likely physiologic post cholecystectomy. Similar appearance to prior study. Nodular contour to the liver consistent with hepatic cirrhosis. Pancreas: Unremarkable. No pancreatic ductal dilatation or surrounding inflammatory  changes. Spleen: Normal in size without focal abnormality. Adrenals/Urinary Tract: Adrenal glands are unremarkable. Kidneys are normal, without renal calculi, focal lesion, or hydronephrosis. Bladder is moderately distended with posterior diverticulum. No wall thickening or filling defect. No stones identified. Stomach/Bowel: Stomach, small bowel, and colon are not abnormally distended. Stool fills the colon. Colonic diverticula without evidence of acute diverticulitis. Appendix is normal. Lymphatic: No significant lymphadenopathy. Reproductive: Status post hysterectomy. No adnexal masses. Other: No abdominal wall hernia or abnormality. No abdominopelvic ascites. Musculoskeletal: Degenerative changes.  No acute bony abnormalities. Review of the MIP images confirms the above findings. IMPRESSION: 1. Diffuse aortic atherosclerosis. Suggestion of calcific stenosis of the left subclavian artery. 2. No evidence of aneurysm or dissection of the thoracic or abdominal aorta. 3. Emphysematous changes and subpleural fibrosis in the lungs. No evidence of active pulmonary disease. 4. Changes of hepatic cirrhosis. Bile duct dilatation is probably due to physiologic post cholecystectomy changes. 5. Bladder distention with posterior diverticulum. No wall thickening or stones. Electronically Signed   By: Lucienne Capers M.D.   On: 11/07/2021 20:18   CT Soft Tissue Neck W Contrast  Result Date: 11/07/2021 CLINICAL DATA:  Soft tissue swelling.  Left facial surgery. EXAM: CT NECK WITH CONTRAST TECHNIQUE: Multidetector CT imaging of the neck was performed using the standard protocol following the bolus administration of intravenous contrast. RADIATION DOSE REDUCTION: This exam was performed according to the departmental dose-optimization program which includes automated exposure control, adjustment of the mA and/or kV according to patient size and/or use of iterative reconstruction technique. CONTRAST:  51m OMNIPAQUE IOHEXOL 350  MG/ML SOLN COMPARISON:  None Available. FINDINGS: PHARYNX AND LARYNX: The nasopharynx, oropharynx and larynx are normal. Visible portions of the oral cavity, tongue base and floor of mouth are normal. Normal epiglottis, vallecula and pyriform sinuses. The larynx is normal. No retropharyngeal abscess, effusion or lymphadenopathy. SALIVARY GLANDS: Normal parotid, submandibular and sublingual glands. THYROID: Normal. LYMPH NODES: Calcific aortic atherosclerosis VASCULAR: Major cervical vessels are patent. LIMITED INTRACRANIAL: Normal. VISUALIZED ORBITS: Normal. MASTOIDS AND VISUALIZED PARANASAL SINUSES: No fluid levels or advanced mucosal thickening. No mastoid effusion. SKELETON: No bony spinal canal stenosis. No lytic or blastic lesions. UPPER CHEST: Clear. OTHER: Inflammatory stranding in the left face, superficial to the masseter. No deep space infection. No  fluid collection. IMPRESSION: 1. Inflammatory stranding in the left face, superficial to the masseter. No deep space infection or fluid collection. Aortic Atherosclerosis (ICD10-I70.0). Electronically Signed   By: Ulyses Jarred M.D.   On: 11/07/2021 20:15   DG Chest Port 1 View  Result Date: 11/07/2021 CLINICAL DATA:  chest pain EXAM: PORTABLE CHEST 1 VIEW COMPARISON:  Chest x-ray 03/31/2021 CT cardiac 04/11/2020 FINDINGS: The heart and mediastinal contours are unchanged. Aortic calcification. No focal consolidation. No pulmonary edema. No pleural effusion. No pneumothorax. No acute osseous abnormality.  Right shoulder arthroplasty. IMPRESSION: No active disease. Electronically Signed   By: Iven Finn M.D.   On: 11/07/2021 19:21    Procedures Procedures    Medications Ordered in ED Medications  sodium chloride 0.9 % bolus 500 mL (0 mLs Intravenous Stopped 11/07/21 2102)  cloNIDine (CATAPRES) tablet 0.1 mg (0.1 mg Oral Given 11/07/21 2100)  amLODipine (NORVASC) tablet 10 mg (10 mg Oral Given 11/07/21 2100)  iohexol (OMNIPAQUE) 350 MG/ML  injection 60 mL (60 mLs Intravenous Contrast Given 11/07/21 1958)    ED Course/ Medical Decision Making/ A&P                           Medical Decision Making KAYSEY BERNDT is a 88 y.o. female here presenting with dizziness and hypertension and left face swelling.  Patient is postop day #4 status post squamous cell carcinoma removal.  We will get a CT neck to rule out deep space infection.  We will also get dissection study since patient is hypertensive has chest pain.  Also consider pneumonia or UTI or COVID as well.  Plan to get CBC and CMP and dissection study and CT neck.  10:38 PM CT showed inflammation L face. Trop flat from 19 to 21. BP improved to 160s from 200. COVID negative. UA nl. Dissection study negative. Symptoms likely from facial cellulitis and hypertension. Will switch to clindamycin from doxycycline. Will have her follow up with dermatology and PCP.   Problems Addressed: Facial cellulitis: acute illness or injury Hypertension, unspecified type: acute illness or injury  Amount and/or Complexity of Data Reviewed Labs: ordered. Decision-making details documented in ED Course. Radiology: ordered and independent interpretation performed. Decision-making details documented in ED Course. ECG/medicine tests: ordered and independent interpretation performed. Decision-making details documented in ED Course.  Risk Prescription drug management.    Final Clinical Impression(s) / ED Diagnoses Final diagnoses:  Hypertension, unspecified type  Facial cellulitis    Rx / DC Orders ED Discharge Orders          Ordered    clindamycin (CLEOCIN) 300 MG capsule  3 times daily        11/07/21 2232              Drenda Freeze, MD 11/07/21 2243

## 2021-11-07 NOTE — Discharge Instructions (Signed)
Your labs are reassuring and your CT scan shows some inflammation by your incision site.  There is no deep space infection however.  Please stop taking doxycycline and I have switched you to clindamycin instead.  You should call your dermatologist tomorrow and follow-up next week as scheduled.  Please take your blood pressure medicines as prescribed  Please follow-up with your primary care doctor in a week regarding her blood pressure  Return to ER if you have worse dizziness, headache, chest pain, trouble breathing, worsening left facial swelling, fever

## 2021-11-07 NOTE — ED Triage Notes (Signed)
Pt arrived via [facility] from home for HTN, weakness, and dizziness. Pt reported weakness, generalized malaise x1 week and called EMS after check blood pressure and finding it to be high. A&Ox4. 20g RH.   BP 210/86 HR 45-55 afib RR 16 SPO2 96% CBG 131

## 2021-11-18 DIAGNOSIS — R0600 Dyspnea, unspecified: Secondary | ICD-10-CM | POA: Diagnosis not present

## 2021-11-18 DIAGNOSIS — I10 Essential (primary) hypertension: Secondary | ICD-10-CM | POA: Diagnosis not present

## 2021-11-18 DIAGNOSIS — R531 Weakness: Secondary | ICD-10-CM | POA: Diagnosis not present

## 2021-11-18 DIAGNOSIS — Z23 Encounter for immunization: Secondary | ICD-10-CM | POA: Diagnosis not present

## 2021-11-18 DIAGNOSIS — E1129 Type 2 diabetes mellitus with other diabetic kidney complication: Secondary | ICD-10-CM | POA: Diagnosis not present

## 2021-11-18 DIAGNOSIS — R269 Unspecified abnormalities of gait and mobility: Secondary | ICD-10-CM | POA: Diagnosis not present

## 2021-12-09 ENCOUNTER — Other Ambulatory Visit: Payer: Self-pay | Admitting: Cardiovascular Disease

## 2021-12-09 DIAGNOSIS — E1169 Type 2 diabetes mellitus with other specified complication: Secondary | ICD-10-CM

## 2022-01-13 ENCOUNTER — Other Ambulatory Visit: Payer: Self-pay | Admitting: Cardiovascular Disease

## 2022-01-13 DIAGNOSIS — I200 Unstable angina: Secondary | ICD-10-CM

## 2022-01-13 DIAGNOSIS — I10 Essential (primary) hypertension: Secondary | ICD-10-CM

## 2022-01-14 DIAGNOSIS — H26492 Other secondary cataract, left eye: Secondary | ICD-10-CM | POA: Diagnosis not present

## 2022-01-14 DIAGNOSIS — Z961 Presence of intraocular lens: Secondary | ICD-10-CM | POA: Diagnosis not present

## 2022-01-14 DIAGNOSIS — H04123 Dry eye syndrome of bilateral lacrimal glands: Secondary | ICD-10-CM | POA: Diagnosis not present

## 2022-01-14 DIAGNOSIS — H353131 Nonexudative age-related macular degeneration, bilateral, early dry stage: Secondary | ICD-10-CM | POA: Diagnosis not present

## 2022-02-05 ENCOUNTER — Other Ambulatory Visit: Payer: Self-pay | Admitting: Cardiovascular Disease

## 2022-02-07 ENCOUNTER — Other Ambulatory Visit: Payer: Self-pay | Admitting: Cardiovascular Disease

## 2022-02-10 DIAGNOSIS — E1129 Type 2 diabetes mellitus with other diabetic kidney complication: Secondary | ICD-10-CM | POA: Diagnosis not present

## 2022-02-10 DIAGNOSIS — R112 Nausea with vomiting, unspecified: Secondary | ICD-10-CM | POA: Diagnosis not present

## 2022-02-10 DIAGNOSIS — Z1152 Encounter for screening for COVID-19: Secondary | ICD-10-CM | POA: Diagnosis not present

## 2022-02-10 DIAGNOSIS — A084 Viral intestinal infection, unspecified: Secondary | ICD-10-CM | POA: Diagnosis not present

## 2022-02-17 DIAGNOSIS — E1129 Type 2 diabetes mellitus with other diabetic kidney complication: Secondary | ICD-10-CM | POA: Diagnosis not present

## 2022-02-17 DIAGNOSIS — R634 Abnormal weight loss: Secondary | ICD-10-CM | POA: Diagnosis not present

## 2022-02-17 DIAGNOSIS — N183 Chronic kidney disease, stage 3 unspecified: Secondary | ICD-10-CM | POA: Diagnosis not present

## 2022-02-17 DIAGNOSIS — I129 Hypertensive chronic kidney disease with stage 1 through stage 4 chronic kidney disease, or unspecified chronic kidney disease: Secondary | ICD-10-CM | POA: Diagnosis not present

## 2022-03-09 ENCOUNTER — Other Ambulatory Visit: Payer: Self-pay | Admitting: Cardiovascular Disease

## 2022-03-09 DIAGNOSIS — E1169 Type 2 diabetes mellitus with other specified complication: Secondary | ICD-10-CM

## 2022-03-19 ENCOUNTER — Emergency Department (HOSPITAL_COMMUNITY): Payer: Medicare Other

## 2022-03-19 ENCOUNTER — Emergency Department (HOSPITAL_COMMUNITY)
Admission: EM | Admit: 2022-03-19 | Discharge: 2022-03-20 | Disposition: A | Discharge status: Home / Self Care | Payer: Medicare Other | Attending: Emergency Medicine | Admitting: Emergency Medicine

## 2022-03-19 ENCOUNTER — Other Ambulatory Visit: Payer: Self-pay

## 2022-03-19 DIAGNOSIS — R197 Diarrhea, unspecified: Secondary | ICD-10-CM | POA: Diagnosis not present

## 2022-03-19 DIAGNOSIS — Z1152 Encounter for screening for COVID-19: Secondary | ICD-10-CM | POA: Insufficient documentation

## 2022-03-19 DIAGNOSIS — R110 Nausea: Secondary | ICD-10-CM | POA: Diagnosis not present

## 2022-03-19 DIAGNOSIS — R5383 Other fatigue: Secondary | ICD-10-CM | POA: Insufficient documentation

## 2022-03-19 DIAGNOSIS — K5730 Diverticulosis of large intestine without perforation or abscess without bleeding: Secondary | ICD-10-CM | POA: Diagnosis not present

## 2022-03-19 DIAGNOSIS — R519 Headache, unspecified: Secondary | ICD-10-CM | POA: Diagnosis not present

## 2022-03-19 DIAGNOSIS — K5790 Diverticulosis of intestine, part unspecified, without perforation or abscess without bleeding: Secondary | ICD-10-CM | POA: Insufficient documentation

## 2022-03-19 DIAGNOSIS — R531 Weakness: Secondary | ICD-10-CM | POA: Diagnosis not present

## 2022-03-19 DIAGNOSIS — M791 Myalgia, unspecified site: Secondary | ICD-10-CM | POA: Insufficient documentation

## 2022-03-19 DIAGNOSIS — I10 Essential (primary) hypertension: Secondary | ICD-10-CM | POA: Diagnosis not present

## 2022-03-19 DIAGNOSIS — Z79899 Other long term (current) drug therapy: Secondary | ICD-10-CM | POA: Insufficient documentation

## 2022-03-19 DIAGNOSIS — Z7984 Long term (current) use of oral hypoglycemic drugs: Secondary | ICD-10-CM | POA: Diagnosis not present

## 2022-03-19 DIAGNOSIS — Z7901 Long term (current) use of anticoagulants: Secondary | ICD-10-CM | POA: Diagnosis not present

## 2022-03-19 DIAGNOSIS — Z9049 Acquired absence of other specified parts of digestive tract: Secondary | ICD-10-CM | POA: Diagnosis not present

## 2022-03-19 DIAGNOSIS — R509 Fever, unspecified: Secondary | ICD-10-CM | POA: Diagnosis not present

## 2022-03-19 LAB — CBC WITH DIFFERENTIAL/PLATELET
Abs Immature Granulocytes: 0.01 10*3/uL (ref 0.00–0.07)
Basophils Absolute: 0 10*3/uL (ref 0.0–0.1)
Basophils Relative: 0 %
Eosinophils Absolute: 0.1 10*3/uL (ref 0.0–0.5)
Eosinophils Relative: 1 %
HCT: 36.9 % (ref 36.0–46.0)
Hemoglobin: 12.6 g/dL (ref 12.0–15.0)
Immature Granulocytes: 0 %
Lymphocytes Relative: 16 %
Lymphs Abs: 1.2 10*3/uL (ref 0.7–4.0)
MCH: 33.1 pg (ref 26.0–34.0)
MCHC: 34.1 g/dL (ref 30.0–36.0)
MCV: 96.9 fL (ref 80.0–100.0)
Monocytes Absolute: 0.6 10*3/uL (ref 0.1–1.0)
Monocytes Relative: 9 %
Neutro Abs: 5.4 10*3/uL (ref 1.7–7.7)
Neutrophils Relative %: 74 %
Platelets: 166 10*3/uL (ref 150–400)
RBC: 3.81 MIL/uL — ABNORMAL LOW (ref 3.87–5.11)
RDW: 12 % (ref 11.5–15.5)
WBC: 7.3 10*3/uL (ref 4.0–10.5)
nRBC: 0 % (ref 0.0–0.2)

## 2022-03-19 LAB — COMPREHENSIVE METABOLIC PANEL
ALT: 24 U/L (ref 0–44)
AST: 27 U/L (ref 15–41)
Albumin: 3.8 g/dL (ref 3.5–5.0)
Alkaline Phosphatase: 46 U/L (ref 38–126)
Anion gap: 12 (ref 5–15)
BUN: 26 mg/dL — ABNORMAL HIGH (ref 8–23)
CO2: 23 mmol/L (ref 22–32)
Calcium: 8.6 mg/dL — ABNORMAL LOW (ref 8.9–10.3)
Chloride: 100 mmol/L (ref 98–111)
Creatinine, Ser: 1.15 mg/dL — ABNORMAL HIGH (ref 0.44–1.00)
GFR, Estimated: 44 mL/min — ABNORMAL LOW (ref >60)
Glucose, Bld: 194 mg/dL — ABNORMAL HIGH (ref 70–99)
Potassium: 3.7 mmol/L (ref 3.5–5.1)
Sodium: 135 mmol/L (ref 135–145)
Total Bilirubin: 0.6 mg/dL (ref 0.3–1.2)
Total Protein: 6.9 g/dL (ref 6.5–8.1)

## 2022-03-19 LAB — RESP PANEL BY RT-PCR (RSV, FLU A&B, COVID)  RVPGX2
Influenza A by PCR: NEGATIVE
Influenza B by PCR: NEGATIVE
Resp Syncytial Virus by PCR: NEGATIVE
SARS Coronavirus 2 by RT PCR: NEGATIVE

## 2022-03-19 LAB — LIPASE, BLOOD: Lipase: 51 U/L (ref 11–51)

## 2022-03-19 LAB — TROPONIN I (HIGH SENSITIVITY): Troponin I (High Sensitivity): 19 ng/L — ABNORMAL HIGH (ref <18)

## 2022-03-19 LAB — LACTIC ACID, PLASMA: Lactic Acid, Venous: 1.2 mmol/L (ref 0.5–1.9)

## 2022-03-19 MED ORDER — ACETAMINOPHEN 500 MG PO TABS
1000.0000 mg | ORAL_TABLET | Freq: Once | ORAL | Status: AC
  Administered 2022-03-19: 1000 mg via ORAL
  Filled 2022-03-19: qty 2

## 2022-03-19 MED ORDER — SODIUM CHLORIDE 0.9 % IV BOLUS
500.0000 mL | Freq: Once | INTRAVENOUS | Status: AC
  Administered 2022-03-19: 500 mL via INTRAVENOUS

## 2022-03-19 MED ORDER — ALUM & MAG HYDROXIDE-SIMETH 200-200-20 MG/5ML PO SUSP
30.0000 mL | Freq: Once | ORAL | Status: AC
  Administered 2022-03-19: 30 mL via ORAL
  Filled 2022-03-19: qty 30

## 2022-03-19 NOTE — ED Triage Notes (Signed)
Pt BIB EMS from home for flu-like symptoms and liquidy diarrhea. Pt c/o headache, weakness fatigue, and nasal congestion. Family has had the same symptoms.

## 2022-03-20 ENCOUNTER — Encounter (HOSPITAL_COMMUNITY): Payer: Self-pay

## 2022-03-20 ENCOUNTER — Emergency Department (HOSPITAL_COMMUNITY): Payer: Medicare Other

## 2022-03-20 DIAGNOSIS — R197 Diarrhea, unspecified: Secondary | ICD-10-CM | POA: Diagnosis not present

## 2022-03-20 DIAGNOSIS — K5730 Diverticulosis of large intestine without perforation or abscess without bleeding: Secondary | ICD-10-CM | POA: Diagnosis not present

## 2022-03-20 LAB — TROPONIN I (HIGH SENSITIVITY): Troponin I (High Sensitivity): 18 ng/L — ABNORMAL HIGH (ref <18)

## 2022-03-20 MED ORDER — IOHEXOL 300 MG/ML  SOLN
80.0000 mL | Freq: Once | INTRAMUSCULAR | Status: AC | PRN
  Administered 2022-03-20: 80 mL via INTRAVENOUS

## 2022-03-20 MED ORDER — SODIUM CHLORIDE (PF) 0.9 % IJ SOLN
INTRAMUSCULAR | Status: AC
  Filled 2022-03-20: qty 50

## 2022-03-20 NOTE — ED Notes (Signed)
Pt resting comfortably in bed

## 2022-03-20 NOTE — ED Provider Notes (Signed)
[place] [facility] Provider Note   CSN: [id number] Arrival date & time: 03/19/22  2035     History  Chief Complaint  Patient presents with   Diarrhea   Weakness    Allison Duffy is a 89 y.o. female.  The history is provided by the patient.  Diarrhea Quality:  Watery Severity:  Mild Onset quality:  Gradual Number of episodes:  3 Duration:  1 day Timing:  Rare Progression:  Worsening Relieved by:  Nothing Worsened by:  Nothing Ineffective treatments:  None tried Associated symptoms: headaches   Associated symptoms: no fever   Risk factors: no recent antibiotic use   Patient with fatigue, body aches,     Home Medications Prior to Admission medications   Medication Sig Start Date End Date Taking? Authorizing Provider  glipiZIDE (GLUCOTROL XL) 10 MG 24 hr tablet Take 10 mg by mouth every morning. 01/01/22  Yes [provider]  amLODipine (NORVASC) 10 MG tablet Take 10 mg by mouth at bedtime.    [provider]  carboxymethylcellulose 1 % ophthalmic solution Place 1 drop into both eyes 2 (two) times daily.    [provider]  carvedilol (COREG) 12.5 MG tablet TAKE 1 TABLET BY MOUTH TWICE DAILY WITH A MEAL 02/06/22   Lorretta Harp, MD  cholecalciferol (VITAMIN D) 1000 UNITS tablet Take 1,000 Units by mouth daily with lunch.    [provider]  cholestyramine (QUESTRAN) 4 g packet Take 4 g by mouth daily as needed (IBS).    [provider]  clindamycin (CLEOCIN) 300 MG capsule Take 1 capsule (300 mg total) by mouth 3 (three) times daily. 11/07/21   Drenda Freeze, MD  cloNIDine (CATAPRES) 0.1 MG tablet TAKE 2 TABLET BY MOUTH EVERY MORNING AND 1 TABLET BY MOUTH EVERY NIGHT AT BEDTIME 07/05/21   Lorretta Harp, MD  ELIQUIS 5 MG TABS tablet TAKE 1 TABLET BY MOUTH TWICE DAILY 08/05/21   Lorretta Harp, MD  furosemide (LASIX) 40 MG tablet Take 2 tablets (80 mg total) by mouth daily.  02/07/22   Lorretta Harp, MD  Lancets [facility] ULTRASOFT) lancets use to check blood sugar twice a day as directed 12/18/11   [provider]  metFORMIN (GLUCOPHAGE) 500 MG tablet Take 1 tablet (500 mg total) by mouth 2 (two) times daily with a meal. 04/15/20   Leonie Man, MD  Multiple Vitamin (MULTIVITAMIN WITH MINERALS) TABS Take 1 tablet by mouth daily with supper.    [provider]  nitroGLYCERIN (NITROSTAT) 0.4 MG SL tablet PLACE 1 TABLET (0.4 MG TOTAL) UNDER THE TONGUE EVERY FIVE MINUTES X 3 DOSES AS NEEDED FOR CHEST PAIN. Patient taking differently: Place 0.4 mg under the tongue every 5 (five) minutes as needed for chest pain. 04/13/20 04/13/21  Leonie Man, MD  olmesartan (BENICAR) 40 MG tablet Take 40 mg by mouth at bedtime.    Burnard Bunting, MD  omeprazole (PRILOSEC) 40 MG capsule TAKE 1 CAPSULE BY MOUTH DAILY WITH SUPPER 01/15/22   Lorretta Harp, MD  [facility] ULTRA test strip USE AS DIRECTED TO TEST BLOOD SUGAR TWICE DAILY 06/29/19   [provider]  potassium chloride SA (KLOR-CON M) 20 MEQ tablet TAKE 1 TABLET BY MOUTH DAILY 04/18/21   Lorretta Harp, MD  Probiotic Product (PRO-BIOTIC BLEND) CAPS Take 1 capsule by mouth at bedtime. Takes OTC probiotic    Burnard Bunting, MD  ranolazine (RANEXA) 500 MG 12  hr tablet TAKE 1 TABLET(500 MG) BY MOUTH TWICE DAILY 01/15/22   Lorretta Harp, MD  rosuvastatin (CRESTOR) 20 MG tablet TAKE 1 TABLET(20 MG) BY MOUTH AT BEDTIME 03/10/22   Lorretta Harp, MD  sitaGLIPtin (JANUVIA) 100 MG tablet Take 100 mg by mouth daily with lunch.    [provider]      Allergies    Codeine, Hydrocodone, Prednisone, and Sulfa antibiotics    Review of Systems   Review of Systems  Constitutional:  Positive for fatigue. Negative for fever.  Respiratory:  Negative for cough, shortness of breath, wheezing and stridor.   Cardiovascular:  Negative for chest pain.  Gastrointestinal:  Positive for diarrhea.   Neurological:  Positive for headaches. Negative for seizures, speech difficulty, weakness and numbness.  All other systems reviewed and are negative.   Physical Exam Updated Vital Signs BP (!) 171/69   Pulse 79   Temp 98.7 F (37.1 C) (Oral)   Resp 20   SpO2 94%  Physical Exam Vitals and nursing note reviewed.  Constitutional:      General: She is not in acute distress.    Appearance: Normal appearance. She is well-developed.  HENT:     Head: Normocephalic and atraumatic.     Nose: Nose normal.  Eyes:     Pupils: Pupils are equal, round, and reactive to light.  Cardiovascular:     Rate and Rhythm: Normal rate and regular rhythm.     Pulses: Normal pulses.     Heart sounds: Normal heart sounds.  Pulmonary:     Effort: Pulmonary effort is normal. No respiratory distress.     Breath sounds: Normal breath sounds.  Abdominal:     General: Bowel sounds are normal. There is no distension.     Palpations: Abdomen is soft.     Tenderness: There is no abdominal tenderness. There is no guarding or rebound.  Genitourinary:    Vagina: No vaginal discharge.  Musculoskeletal:        General: Normal range of motion.     Cervical back: Normal range of motion and neck supple.  Skin:    General: Skin is warm and dry.     Capillary Refill: Capillary refill takes less than 2 seconds.     Findings: No erythema or rash.  Neurological:     General: No focal deficit present.     Mental Status: She is alert and oriented to person, place, and time.     Deep Tendon Reflexes: Reflexes normal.  Psychiatric:        Mood and Affect: Mood normal.     ED Results / Procedures / Treatments   Labs (all labs ordered are listed, but only abnormal results are displayed) Results for orders placed or performed during the hospital encounter of 03/19/22  Resp panel by RT-PCR (RSV, Flu A&B, Covid) Anterior Nasal Swab   Specimen: Anterior Nasal Swab  Result Value Ref Range   SARS Coronavirus 2 by RT  PCR NEGATIVE NEGATIVE   Influenza A by PCR NEGATIVE NEGATIVE   Influenza B by PCR NEGATIVE NEGATIVE   Resp Syncytial Virus by PCR NEGATIVE NEGATIVE  Lactic acid, plasma  Result Value Ref Range   Lactic Acid, Venous 1.2 0.5 - 1.9 mmol/L  CBC with Differential  Result Value Ref Range   WBC 7.3 4.0 - 10.5 K/uL   RBC 3.81 (L) 3.87 - 5.11 MIL/uL   Hemoglobin 12.6 12.0 - 15.0 g/dL   HCT 36.9 36.0 -  46.0 %   MCV 96.9 80.0 - 100.0 fL   MCH 33.1 26.0 - 34.0 pg   MCHC 34.1 30.0 - 36.0 g/dL   RDW 12.0 11.5 - 15.5 %   Platelets 166 150 - 400 K/uL   nRBC 0.0 0.0 - 0.2 %   Neutrophils Relative % 74 %   Neutro Abs 5.4 1.7 - 7.7 K/uL   Lymphocytes Relative 16 %   Lymphs Abs 1.2 0.7 - 4.0 K/uL   Monocytes Relative 9 %   Monocytes Absolute 0.6 0.1 - 1.0 K/uL   Eosinophils Relative 1 %   Eosinophils Absolute 0.1 0.0 - 0.5 K/uL   Basophils Relative 0 %   Basophils Absolute 0.0 0.0 - 0.1 K/uL   Immature Granulocytes 0 %   Abs Immature Granulocytes 0.01 0.00 - 0.07 K/uL  Comprehensive metabolic panel  Result Value Ref Range   Sodium 135 135 - 145 mmol/L   Potassium 3.7 3.5 - 5.1 mmol/L   Chloride 100 98 - 111 mmol/L   CO2 23 22 - 32 mmol/L   Glucose, Bld 194 (H) 70 - 99 mg/dL   BUN 26 (H) 8 - 23 mg/dL   Creatinine, Ser 1.15 (H) 0.44 - 1.00 mg/dL   Calcium 8.6 (L) 8.9 - 10.3 mg/dL   Total Protein 6.9 6.5 - 8.1 g/dL   Albumin 3.8 3.5 - 5.0 g/dL   AST 27 15 - 41 U/L   ALT 24 0 - 44 U/L   Alkaline Phosphatase 46 38 - 126 U/L   Total Bilirubin 0.6 0.3 - 1.2 mg/dL   GFR, Estimated 44 (L) >60 mL/min   Anion gap 12 5 - 15  Lipase, blood  Result Value Ref Range   Lipase 51 11 - 51 U/L  Troponin I (High Sensitivity)  Result Value Ref Range   Troponin I (High Sensitivity) 19 (H) <18 ng/L  Troponin I (High Sensitivity)  Result Value Ref Range   Troponin I (High Sensitivity) 18 (H) <18 ng/L   CT ABDOMEN PELVIS W CONTRAST  Result Date: 03/20/2022 CLINICAL DATA:  Acute abdominal pain and  diarrhea, initial encounter EXAM: CT ABDOMEN AND PELVIS WITH CONTRAST TECHNIQUE: Multidetector CT imaging of the abdomen and pelvis was performed using the standard protocol following bolus administration of intravenous contrast. RADIATION DOSE REDUCTION: This exam was performed according to the departmental dose-optimization program which includes automated exposure control, adjustment of the mA and/or kV according to patient size and/or use of iterative reconstruction technique. CONTRAST:  10m OMNIPAQUE IOHEXOL 300 MG/ML  SOLN COMPARISON:  11/07/2021 FINDINGS: Lower chest: Mild scarring is noted in the bases bilaterally. No acute abnormality is seen. Hepatobiliary: Gallbladder has been surgically removed. Biliary ductal dilatation is seen consistent with the post cholecystectomy state. Mild nodularity of the liver is again identified stable from the prior exam. Pancreas: Unremarkable. No pancreatic ductal dilatation or surrounding inflammatory changes. Spleen: Scattered calcified granulomas are noted. Adrenals/Urinary Tract: Adrenal glands are within normal limits. Kidneys demonstrate a normal enhancement pattern bilaterally. No renal calculi or obstructive changes are seen. The bladder is well distended. Stomach/Bowel: Scattered diverticular change of the colon is noted without evidence of diverticulitis. Fecal material is noted throughout the colon without obstructive change. The appendix is not visualized consistent with prior surgical excision. The stomach and small bowel are within normal limits. Vascular/Lymphatic: Aortic atherosclerosis. No enlarged abdominal or pelvic lymph nodes. Reproductive: Status post hysterectomy. No adnexal masses. Other: No abdominal wall hernia or abnormality. No abdominopelvic ascites. Musculoskeletal:  No acute or significant osseous findings. IMPRESSION: Diverticulosis without diverticulitis. Status post cholecystectomy. Changes of prior granulomatous disease. No acute  abnormality to correspond with the given clinical history. Electronically Signed   By: Inez Catalina M.D.   On: 03/20/2022 01:39   DG Chest Port 1 View  Result Date: 03/19/2022 CLINICAL DATA:  Weakness EXAM: PORTABLE CHEST 1 VIEW COMPARISON:  11/07/2021 FINDINGS: Cardiac shadow is stable. Aortic calcifications are again seen. The lungs are well aerated bilaterally. No focal infiltrate or effusion is seen. No acute bony abnormality is noted. IMPRESSION: No active disease. Electronically Signed   By: Inez Catalina M.D.   On: 03/19/2022 22:01     EKG EKG Interpretation  Date/Time:  Wednesday March 19 2022 22:40:26 EST Ventricular Rate:  66 PR Interval:  236 QRS Duration: 99 QT Interval:  436 QTC Calculation: 457 R Axis:   19 Text Interpretation: Sinus rhythm Prolonged PR interval Baseline wander in lead(s) V6 Confirmed by Randal Buba, Alenah Sarria ([id number]) on 03/19/2022 11:11:25 PM  Radiology CT ABDOMEN PELVIS W CONTRAST  Result Date: 03/20/2022 CLINICAL DATA:  Acute abdominal pain and diarrhea, initial encounter EXAM: CT ABDOMEN AND PELVIS WITH CONTRAST TECHNIQUE: Multidetector CT imaging of the abdomen and pelvis was performed using the standard protocol following bolus administration of intravenous contrast. RADIATION DOSE REDUCTION: This exam was performed according to the departmental dose-optimization program which includes automated exposure control, adjustment of the mA and/or kV according to patient size and/or use of iterative reconstruction technique. CONTRAST:  78m OMNIPAQUE IOHEXOL 300 MG/ML  SOLN COMPARISON:  11/07/2021 FINDINGS: Lower chest: Mild scarring is noted in the bases bilaterally. No acute abnormality is seen. Hepatobiliary: Gallbladder has been surgically removed. Biliary ductal dilatation is seen consistent with the post cholecystectomy state. Mild nodularity of the liver is again identified stable from the prior exam. Pancreas: Unremarkable. No pancreatic ductal dilatation or  surrounding inflammatory changes. Spleen: Scattered calcified granulomas are noted. Adrenals/Urinary Tract: Adrenal glands are within normal limits. Kidneys demonstrate a normal enhancement pattern bilaterally. No renal calculi or obstructive changes are seen. The bladder is well distended. Stomach/Bowel: Scattered diverticular change of the colon is noted without evidence of diverticulitis. Fecal material is noted throughout the colon without obstructive change. The appendix is not visualized consistent with prior surgical excision. The stomach and small bowel are within normal limits. Vascular/Lymphatic: Aortic atherosclerosis. No enlarged abdominal or pelvic lymph nodes. Reproductive: Status post hysterectomy. No adnexal masses. Other: No abdominal wall hernia or abnormality. No abdominopelvic ascites. Musculoskeletal: No acute or significant osseous findings. IMPRESSION: Diverticulosis without diverticulitis. Status post cholecystectomy. Changes of prior granulomatous disease. No acute abnormality to correspond with the given clinical history. Electronically Signed   By: MInez CatalinaM.D.   On: 03/20/2022 01:39   DG Chest Port 1 View  Result Date: 03/19/2022 CLINICAL DATA:  Weakness EXAM: PORTABLE CHEST 1 VIEW COMPARISON:  11/07/2021 FINDINGS: Cardiac shadow is stable. Aortic calcifications are again seen. The lungs are well aerated bilaterally. No focal infiltrate or effusion is seen. No acute bony abnormality is noted. IMPRESSION: No active disease. Electronically Signed   By: MInez CatalinaM.D.   On: 03/19/2022 22:01    Procedures Procedures    Medications Ordered in ED Medications  sodium chloride 0.9 % bolus 500 mL (500 mLs Intravenous Bolus 03/19/22 2339)  acetaminophen (TYLENOL) tablet 1,000 mg (1,000 mg Oral Given 03/19/22 2339)  alum & mag hydroxide-simeth (MAALOX/MYLANTA) 200-200-20 MG/5ML suspension 30 mL (30 mLs Oral Given 03/19/22 2339)  iohexol (OMNIPAQUE)  300 MG/ML solution 80 mL (80  mLs Intravenous Contrast Given 03/20/22 0118)  sodium chloride (PF) 0.9 % injection (  Given by Other 03/20/22 0111)    ED Course/ Medical Decision Making/ A&P                             Medical Decision Making Patient with headache body aches and 3 episodes of diarrhea.  Family has been sick   Amount and/or Complexity of Data Reviewed Independent Historian: friend    Details: See above  External Data Reviewed: notes.    Details: Previous notes  Labs: ordered.    Details: All labs reviewed: normal lactate 1.2, negative covid and flu. Normal white count 7.3, normal hemoglobin 13.6, normal platelets. Normal sodium 135, normal potassium 3.7, normal creatinine.  First troponin 19 and second is lower 18.   Radiology: ordered and independent interpretation performed.    Details: Negative CXR and CT abdomen and pelvis   Risk OTC drugs. Prescription drug management. Risk Details: Well appearing given family is sick this is a likely a viral illness.  Patient is not septic at this time. I do not believe this is cardiac.  Stable for discharge.  Strict return     Final Clinical Impression(s) / ED Diagnoses Final diagnoses:  Diarrhea, unspecified type    Return for intractable cough, coughing up blood, fevers > 100.4 unrelieved by medication, shortness of breath, intractable vomiting, chest pain, shortness of breath, weakness, numbness, changes in speech, facial asymmetry, abdominal pain, passing out, Inability to tolerate liquids or food, cough, altered mental status or any concerns. No signs of systemic illness or infection. The patient is nontoxic-appearing on exam and vital signs are within normal limits.  I have reviewed the triage vital signs and the nursing notes. Pertinent labs & imaging results that were available during my care of the patient were reviewed by me and considered in my medical decision making (see chart for details). After history, exam, and medical workup I feel the  patient has been appropriately medically screened and is safe for discharge home. Pertinent diagnoses were discussed with the patient. Patient was given return precautions.  Rx / DC Orders ED Discharge Orders     None         Shelby Anderle, MD 03/20/22 [phone]

## 2022-03-25 LAB — CULTURE, BLOOD (ROUTINE X 2): Culture: NO GROWTH

## 2022-04-08 ENCOUNTER — Other Ambulatory Visit: Payer: Self-pay | Admitting: Cardiovascular Disease

## 2022-04-08 NOTE — Telephone Encounter (Signed)
Pt last saw Dr Gwenlyn Found 10/08/21, last labs 03/19/22 Creat 1.15, age 89, weight 70.9kg, based on specified criteria pt is on appropriate dosage of Eliquis '5mg'$  BID for afib.  Will refill rx.

## 2022-05-05 ENCOUNTER — Other Ambulatory Visit: Payer: Self-pay | Admitting: Cardiovascular Disease

## 2022-05-28 DIAGNOSIS — E1129 Type 2 diabetes mellitus with other diabetic kidney complication: Secondary | ICD-10-CM | POA: Diagnosis not present

## 2022-05-28 DIAGNOSIS — I129 Hypertensive chronic kidney disease with stage 1 through stage 4 chronic kidney disease, or unspecified chronic kidney disease: Secondary | ICD-10-CM | POA: Diagnosis not present

## 2022-05-28 DIAGNOSIS — N183 Chronic kidney disease, stage 3 unspecified: Secondary | ICD-10-CM | POA: Diagnosis not present

## 2022-07-23 ENCOUNTER — Inpatient Hospital Stay (HOSPITAL_COMMUNITY)
Admission: EM | Admit: 2022-07-23 | Discharge: 2022-07-30 | DRG: 291 | Disposition: A | Discharge status: Skilled Nursing Facility | Payer: Medicare Other | Attending: Internal Medicine | Admitting: Internal Medicine

## 2022-07-23 ENCOUNTER — Emergency Department (HOSPITAL_COMMUNITY): Payer: Medicare Other

## 2022-07-23 ENCOUNTER — Other Ambulatory Visit: Payer: Self-pay

## 2022-07-23 ENCOUNTER — Encounter (HOSPITAL_COMMUNITY): Payer: Self-pay

## 2022-07-23 DIAGNOSIS — E8770 Fluid overload, unspecified: Secondary | ICD-10-CM | POA: Diagnosis not present

## 2022-07-23 DIAGNOSIS — R5383 Other fatigue: Secondary | ICD-10-CM | POA: Diagnosis not present

## 2022-07-23 DIAGNOSIS — I081 Rheumatic disorders of both mitral and tricuspid valves: Secondary | ICD-10-CM | POA: Diagnosis not present

## 2022-07-23 DIAGNOSIS — I160 Hypertensive urgency: Secondary | ICD-10-CM | POA: Diagnosis not present

## 2022-07-23 DIAGNOSIS — Z7901 Long term (current) use of anticoagulants: Secondary | ICD-10-CM | POA: Diagnosis not present

## 2022-07-23 DIAGNOSIS — I2510 Atherosclerotic heart disease of native coronary artery without angina pectoris: Secondary | ICD-10-CM | POA: Diagnosis present

## 2022-07-23 DIAGNOSIS — I5033 Acute on chronic diastolic (congestive) heart failure: Secondary | ICD-10-CM | POA: Diagnosis present

## 2022-07-23 DIAGNOSIS — I440 Atrioventricular block, first degree: Secondary | ICD-10-CM | POA: Diagnosis present

## 2022-07-23 DIAGNOSIS — E876 Hypokalemia: Secondary | ICD-10-CM | POA: Diagnosis not present

## 2022-07-23 DIAGNOSIS — I700 Atherosclerosis of aorta: Secondary | ICD-10-CM | POA: Diagnosis not present

## 2022-07-23 DIAGNOSIS — Z87891 Personal history of nicotine dependence: Secondary | ICD-10-CM

## 2022-07-23 DIAGNOSIS — Z7189 Other specified counseling: Secondary | ICD-10-CM | POA: Diagnosis not present

## 2022-07-23 DIAGNOSIS — Z79899 Other long term (current) drug therapy: Secondary | ICD-10-CM | POA: Diagnosis not present

## 2022-07-23 DIAGNOSIS — E118 Type 2 diabetes mellitus with unspecified complications: Secondary | ICD-10-CM | POA: Diagnosis not present

## 2022-07-23 DIAGNOSIS — H04123 Dry eye syndrome of bilateral lacrimal glands: Secondary | ICD-10-CM | POA: Diagnosis not present

## 2022-07-23 DIAGNOSIS — E785 Hyperlipidemia, unspecified: Secondary | ICD-10-CM | POA: Diagnosis present

## 2022-07-23 DIAGNOSIS — N179 Acute kidney failure, unspecified: Secondary | ICD-10-CM | POA: Diagnosis not present

## 2022-07-23 DIAGNOSIS — R0602 Shortness of breath: Secondary | ICD-10-CM | POA: Diagnosis present

## 2022-07-23 DIAGNOSIS — Z9049 Acquired absence of other specified parts of digestive tract: Secondary | ICD-10-CM

## 2022-07-23 DIAGNOSIS — Z885 Allergy status to narcotic agent status: Secondary | ICD-10-CM

## 2022-07-23 DIAGNOSIS — E892 Postprocedural hypoparathyroidism: Secondary | ICD-10-CM | POA: Diagnosis present

## 2022-07-23 DIAGNOSIS — I110 Hypertensive heart disease with heart failure: Secondary | ICD-10-CM | POA: Diagnosis not present

## 2022-07-23 DIAGNOSIS — J9601 Acute respiratory failure with hypoxia: Secondary | ICD-10-CM | POA: Diagnosis not present

## 2022-07-23 DIAGNOSIS — K589 Irritable bowel syndrome without diarrhea: Secondary | ICD-10-CM | POA: Diagnosis not present

## 2022-07-23 DIAGNOSIS — I4891 Unspecified atrial fibrillation: Secondary | ICD-10-CM | POA: Diagnosis not present

## 2022-07-23 DIAGNOSIS — E559 Vitamin D deficiency, unspecified: Secondary | ICD-10-CM | POA: Diagnosis not present

## 2022-07-23 DIAGNOSIS — E1165 Type 2 diabetes mellitus with hyperglycemia: Secondary | ICD-10-CM | POA: Diagnosis not present

## 2022-07-23 DIAGNOSIS — Z66 Do not resuscitate: Secondary | ICD-10-CM | POA: Diagnosis present

## 2022-07-23 DIAGNOSIS — Z888 Allergy status to other drugs, medicaments and biological substances status: Secondary | ICD-10-CM

## 2022-07-23 DIAGNOSIS — R001 Bradycardia, unspecified: Secondary | ICD-10-CM | POA: Diagnosis present

## 2022-07-23 DIAGNOSIS — Z515 Encounter for palliative care: Secondary | ICD-10-CM | POA: Diagnosis not present

## 2022-07-23 DIAGNOSIS — K219 Gastro-esophageal reflux disease without esophagitis: Secondary | ICD-10-CM | POA: Diagnosis not present

## 2022-07-23 DIAGNOSIS — Z1152 Encounter for screening for COVID-19: Secondary | ICD-10-CM | POA: Diagnosis not present

## 2022-07-23 DIAGNOSIS — R0789 Other chest pain: Secondary | ICD-10-CM | POA: Diagnosis not present

## 2022-07-23 DIAGNOSIS — Z471 Aftercare following joint replacement surgery: Secondary | ICD-10-CM | POA: Diagnosis not present

## 2022-07-23 DIAGNOSIS — I350 Nonrheumatic aortic (valve) stenosis: Secondary | ICD-10-CM | POA: Diagnosis not present

## 2022-07-23 DIAGNOSIS — I4821 Permanent atrial fibrillation: Secondary | ICD-10-CM | POA: Diagnosis not present

## 2022-07-23 DIAGNOSIS — Z882 Allergy status to sulfonamides status: Secondary | ICD-10-CM | POA: Diagnosis not present

## 2022-07-23 DIAGNOSIS — I10 Essential (primary) hypertension: Secondary | ICD-10-CM

## 2022-07-23 DIAGNOSIS — Z7401 Bed confinement status: Secondary | ICD-10-CM | POA: Diagnosis not present

## 2022-07-23 DIAGNOSIS — I169 Hypertensive crisis, unspecified: Secondary | ICD-10-CM | POA: Diagnosis not present

## 2022-07-23 DIAGNOSIS — M1990 Unspecified osteoarthritis, unspecified site: Secondary | ICD-10-CM | POA: Diagnosis present

## 2022-07-23 DIAGNOSIS — I200 Unstable angina: Secondary | ICD-10-CM | POA: Diagnosis not present

## 2022-07-23 DIAGNOSIS — R079 Chest pain, unspecified: Secondary | ICD-10-CM | POA: Diagnosis not present

## 2022-07-23 DIAGNOSIS — Z7984 Long term (current) use of oral hypoglycemic drugs: Secondary | ICD-10-CM | POA: Diagnosis not present

## 2022-07-23 DIAGNOSIS — R42 Dizziness and giddiness: Secondary | ICD-10-CM | POA: Diagnosis not present

## 2022-07-23 DIAGNOSIS — Z833 Family history of diabetes mellitus: Secondary | ICD-10-CM

## 2022-07-23 DIAGNOSIS — E1129 Type 2 diabetes mellitus with other diabetic kidney complication: Secondary | ICD-10-CM | POA: Diagnosis not present

## 2022-07-23 DIAGNOSIS — Z96611 Presence of right artificial shoulder joint: Secondary | ICD-10-CM | POA: Diagnosis not present

## 2022-07-23 DIAGNOSIS — R531 Weakness: Secondary | ICD-10-CM | POA: Diagnosis not present

## 2022-07-23 DIAGNOSIS — Z8679 Personal history of other diseases of the circulatory system: Secondary | ICD-10-CM | POA: Diagnosis not present

## 2022-07-23 DIAGNOSIS — Z961 Presence of intraocular lens: Secondary | ICD-10-CM | POA: Diagnosis present

## 2022-07-23 DIAGNOSIS — Z8249 Family history of ischemic heart disease and other diseases of the circulatory system: Secondary | ICD-10-CM

## 2022-07-23 LAB — BASIC METABOLIC PANEL
Anion gap: 12 (ref 5–15)
BUN: 21 mg/dL (ref 8–23)
CO2: 25 mmol/L (ref 22–32)
Calcium: 9.3 mg/dL (ref 8.9–10.3)
Chloride: 100 mmol/L (ref 98–111)
Creatinine, Ser: 1.24 mg/dL — ABNORMAL HIGH (ref 0.44–1.00)
GFR, Estimated: 40 mL/min — ABNORMAL LOW (ref >60)
Glucose, Bld: 163 mg/dL — ABNORMAL HIGH (ref 70–99)
Potassium: 3.8 mmol/L (ref 3.5–5.1)
Sodium: 137 mmol/L (ref 135–145)

## 2022-07-23 LAB — BRAIN NATRIURETIC PEPTIDE: B Natriuretic Peptide: 191.7 pg/mL — ABNORMAL HIGH (ref 0.0–100.0)

## 2022-07-23 LAB — TROPONIN I (HIGH SENSITIVITY)
Troponin I (High Sensitivity): 13 ng/L (ref <18)
Troponin I (High Sensitivity): 15 ng/L (ref <18)

## 2022-07-23 LAB — CBC
HCT: 33.9 % — ABNORMAL LOW (ref 36.0–46.0)
Hemoglobin: 12.1 g/dL (ref 12.0–15.0)
MCH: 34.3 pg — ABNORMAL HIGH (ref 26.0–34.0)
MCHC: 35.7 g/dL (ref 30.0–36.0)
MCV: 96 fL (ref 80.0–100.0)
Platelets: 205 10*3/uL (ref 150–400)
RBC: 3.53 MIL/uL — ABNORMAL LOW (ref 3.87–5.11)
RDW: 11.7 % (ref 11.5–15.5)
WBC: 7.8 10*3/uL (ref 4.0–10.5)
nRBC: 0 % (ref 0.0–0.2)

## 2022-07-23 LAB — URINALYSIS, ROUTINE W REFLEX MICROSCOPIC
Bacteria, UA: NONE SEEN
Bilirubin Urine: NEGATIVE
Glucose, UA: NEGATIVE mg/dL
Ketones, ur: NEGATIVE mg/dL
Leukocytes,Ua: NEGATIVE
Nitrite: NEGATIVE
Protein, ur: 30 mg/dL — AB
Specific Gravity, Urine: 1.005 (ref 1.005–1.030)
pH: 7 (ref 5.0–8.0)

## 2022-07-23 LAB — D-DIMER, QUANTITATIVE: D-Dimer, Quant: <0.27 ug/mL-FEU (ref 0.00–0.50)

## 2022-07-23 LAB — SARS CORONAVIRUS 2 BY RT PCR: SARS Coronavirus 2 by RT PCR: NEGATIVE

## 2022-07-23 LAB — CBG MONITORING, ED: Glucose-Capillary: 141 mg/dL — ABNORMAL HIGH (ref 70–99)

## 2022-07-23 MED ORDER — FUROSEMIDE 10 MG/ML IJ SOLN
80.0000 mg | Freq: Once | INTRAMUSCULAR | Status: AC
  Administered 2022-07-23: 80 mg via INTRAVENOUS
  Filled 2022-07-23: qty 8

## 2022-07-23 MED ORDER — AMLODIPINE BESYLATE 10 MG PO TABS
10.0000 mg | ORAL_TABLET | Freq: Every day | ORAL | Status: DC
  Administered 2022-07-23 – 2022-07-29 (×7): 10 mg via ORAL
  Filled 2022-07-23 (×5): qty 1
  Filled 2022-07-23: qty 2
  Filled 2022-07-23: qty 1

## 2022-07-23 MED ORDER — CLONIDINE HCL 0.1 MG PO TABS
0.1000 mg | ORAL_TABLET | Freq: Once | ORAL | Status: AC
  Administered 2022-07-23: 0.1 mg via ORAL
  Filled 2022-07-23: qty 1

## 2022-07-23 NOTE — ED Provider Notes (Incomplete)
[place] EMERGENCY DEPARTMENT AT [facility] Provider Note   CSN: [id number] Arrival date & time: 07/23/22  1640     History  Chief Complaint  Patient presents with   Chest Pain    Allison Duffy is a 89 y.o. female.   Chest Pain    89 year old female with medical history significant for DM2, CKD, hypertension, CAD, CHF who presents to the emergency department with chest pain.  The patient states that she developed left-sided chest discomfort that has persisted over the past few days.  She initially presented to her PCPs office earlier today and received 324 mg of aspirin and 2 nitroglycerin and route with EMS.  She was notably hypertensive with a blood pressure of 200/90.  She continues to endorse ongoing left-sided chest discomfort.  No ripping or tearing pain. No radiation. She endorses mild shortness of breath.  She states that she has had bilateral lower extremity swelling over the past few days and notes that the left leg is more swollen than the right.  No cough fever or chills.  Home Medications Prior to Admission medications   Medication Sig Start Date End Date Taking? Authorizing Provider  amLODipine (NORVASC) 10 MG tablet Take 10 mg by mouth at bedtime.    [provider]  carboxymethylcellulose 1 % ophthalmic solution Place 1 drop into both eyes 2 (two) times daily.    [provider]  carvedilol (COREG) 12.5 MG tablet TAKE 1 TABLET BY MOUTH TWICE DAILY WITH A MEAL 02/06/22   Runell Gess, MD  cholecalciferol (VITAMIN D) 1000 UNITS tablet Take 1,000 Units by mouth daily with lunch.    [provider]  cholestyramine (QUESTRAN) 4 g packet Take 4 g by mouth daily as needed (IBS).    [provider]  clindamycin (CLEOCIN) 300 MG capsule Take 1 capsule (300 mg total) by mouth 3 (three) times daily. 11/07/21   Charlynne Pander, MD  cloNIDine (CATAPRES) 0.1 MG tablet TAKE 2 TABLET BY MOUTH EVERY MORNING AND 1 TABLET BY  MOUTH EVERY NIGHT AT BEDTIME 07/05/21   Runell Gess, MD  ELIQUIS 5 MG TABS tablet TAKE 1 TABLET BY MOUTH TWICE DAILY 04/08/22   Runell Gess, MD  furosemide (LASIX) 40 MG tablet Take 2 tablets (80 mg total) by mouth daily. 02/07/22   Runell Gess, MD  glipiZIDE (GLUCOTROL XL) 10 MG 24 hr tablet Take 10 mg by mouth every morning. 01/01/22   [provider]  Lancets Letta Pate ULTRASOFT) lancets use to check blood sugar twice a day as directed 12/18/11   [provider]  metFORMIN (GLUCOPHAGE) 500 MG tablet Take 1 tablet (500 mg total) by mouth 2 (two) times daily with a meal. 04/15/20   Marykay Lex, MD  Multiple Vitamin (MULTIVITAMIN WITH MINERALS) TABS Take 1 tablet by mouth daily with supper.    [provider]  nitroGLYCERIN (NITROSTAT) 0.4 MG SL tablet PLACE 1 TABLET (0.4 MG TOTAL) UNDER THE TONGUE EVERY FIVE MINUTES X 3 DOSES AS NEEDED FOR CHEST PAIN. Patient taking differently: Place 0.4 mg under the tongue every 5 (five) minutes as needed for chest pain. 04/13/20 04/13/21  Marykay Lex, MD  olmesartan (BENICAR) 40 MG tablet Take 40 mg by mouth at bedtime.    Geoffry Paradise, MD  omeprazole (PRILOSEC) 40 MG capsule TAKE 1 CAPSULE BY MOUTH DAILY WITH SUPPER 01/15/22   Runell Gess, MD  [facility] ULTRA test strip USE AS DIRECTED TO TEST  BLOOD SUGAR TWICE DAILY 06/29/19   [provider]  potassium chloride SA (KLOR-CON M) 20 MEQ tablet TAKE 1 TABLET BY MOUTH DAILY 05/05/22   Runell Gess, MD  Probiotic Product (PRO-BIOTIC BLEND) CAPS Take 1 capsule by mouth at bedtime. Takes OTC probiotic    Geoffry Paradise, MD  ranolazine (RANEXA) 500 MG 12 hr tablet TAKE 1 TABLET(500 MG) BY MOUTH TWICE DAILY 01/15/22   Runell Gess, MD  rosuvastatin (CRESTOR) 20 MG tablet TAKE 1 TABLET(20 MG) BY MOUTH AT BEDTIME 03/10/22   Runell Gess, MD  sitaGLIPtin (JANUVIA) 100 MG tablet Take 100 mg by mouth daily with lunch.    [provider]       Allergies    Codeine, Hydrocodone, Prednisone, and Sulfa antibiotics    Review of Systems   Review of Systems  Cardiovascular:  Positive for chest pain and leg swelling.  All other systems reviewed and are negative.   Physical Exam Updated Vital Signs BP (!) 189/79   Pulse 61   Temp 98.3 F (36.8 C)   Resp 14   Ht 5\' 6"  (1.676 m)   Wt 67.6 kg   SpO2 98%   BMI 24.05 kg/m  Physical Exam Vitals and nursing note reviewed.  Constitutional:      General: She is not in acute distress.    Appearance: She is well-developed.  HENT:     Head: Normocephalic and atraumatic.  Eyes:     Conjunctiva/sclera: Conjunctivae normal.  Cardiovascular:     Rate and Rhythm: Normal rate and regular rhythm.  Pulmonary:     Effort: Pulmonary effort is normal. No respiratory distress.     Breath sounds: Normal breath sounds.  Abdominal:     Palpations: Abdomen is soft.     Tenderness: There is no abdominal tenderness.  Musculoskeletal:        General: No swelling.     Cervical back: Neck supple.     Right lower leg: Edema present.     Left lower leg: Edema present.     Comments: Bilateral lower extremity edema, 1+ on the right, 2+ on the left, intact distal pulses  Skin:    General: Skin is warm and dry.     Capillary Refill: Capillary refill takes less than 2 seconds.  Neurological:     Mental Status: She is alert.  Psychiatric:        Mood and Affect: Mood normal.     ED Results / Procedures / Treatments   Labs (all labs ordered are listed, but only abnormal results are displayed) Labs Reviewed  BASIC METABOLIC PANEL - Abnormal; Notable for the following components:      Result Value   Glucose, Bld 163 (*)    Creatinine, Ser 1.24 (*)    GFR, Estimated 40 (*)    All other components within normal limits  CBC - Abnormal; Notable for the following components:   RBC 3.53 (*)    HCT 33.9 (*)    MCH 34.3 (*)    All other components within normal limits  BRAIN NATRIURETIC  PEPTIDE - Abnormal; Notable for the following components:   B Natriuretic Peptide 191.7 (*)    All other components within normal limits  CBG MONITORING, ED - Abnormal; Notable for the following components:   Glucose-Capillary 141 (*)    All other components within normal limits  SARS CORONAVIRUS 2 BY RT PCR  URINALYSIS, ROUTINE W REFLEX MICROSCOPIC  D-DIMER, QUANTITATIVE  TROPONIN  I (HIGH SENSITIVITY)  TROPONIN I (HIGH SENSITIVITY)    EKG EKG Interpretation  Date/Time:  Wednesday July 23 2022 16:45:45 EDT Ventricular Rate:  62 PR Interval:  255 QRS Duration: 100 QT Interval:  477 QTC Calculation: 485 R Axis:   21 Text Interpretation: Sinus or ectopic atrial rhythm Prolonged PR interval Probable left ventricular hypertrophy Confirmed by Ernie Avena ([id number]) on 07/23/2022 8:57:55 PM  Radiology DG Chest Port 1 View  Result Date: 07/23/2022 CLINICAL DATA:  Chest pressure EXAM: PORTABLE CHEST 1 VIEW COMPARISON:  03/19/2022 FINDINGS: Normal cardiac and mediastinal contours. Aortic atherosclerosis. No focal pulmonary opacity. No pleural effusion or pneumothorax. No acute osseous abnormality. Status post right reverse shoulder arthroplasty. A advanced degenerative changes in the left shoulder. Surgical clips overlie the superior mediastinum. IMPRESSION: No active disease. Electronically Signed   By: Wiliam Ke M.D.   On: 07/23/2022 17:49    Procedures Procedures    Medications Ordered in ED Medications  amLODipine (NORVASC) tablet 10 mg (has no administration in time range)  cloNIDine (CATAPRES) tablet 0.1 mg (0.1 mg Oral Given 07/23/22 1925)  furosemide (LASIX) injection 80 mg (80 mg Intravenous Given 07/23/22 1925)    ED Course/ Medical Decision Making/ A&P                             Medical Decision Making Amount and/or Complexity of Data Reviewed Labs: ordered. Radiology: ordered.  Risk Prescription drug management.    89 year old female with medical history  significant for DM2, CKD, hypertension, CAD, CHF who presents to the emergency department with chest pain.  The patient states that she developed left-sided chest discomfort that has persisted over the past few days.  She initially presented to her PCPs office earlier today and received 324 mg of aspirin and 2 nitroglycerin and route with EMS.  She was notably hypertensive with a blood pressure of 200/90.  She continues to endorse ongoing left-sided chest discomfort.  No ripping or tearing pain. No radiation. She endorses mild shortness of breath.  She states that she has had bilateral lower extremity swelling over the past few days and notes that the left leg is more swollen than the right.  No cough fever or chills.  On arrival, the patient was afebrile, not tachycardic or tachypneic, hypertensive BP 196/121, saturating 100% on room air.  {Document critical care time when appropriate:1} {Document review of labs and clinical decision tools ie heart score, Chads2Vasc2 etc:1}  {Document your independent review of radiology images, and any outside records:1} {Document your discussion with family members, caretakers, and with consultants:1} {Document social determinants of health affecting pt's care:1} {Document your decision making why or why not admission, treatments were needed:1} Final Clinical Impression(s) / ED Diagnoses Final diagnoses:  None    Rx / DC Orders ED Discharge Orders     None

## 2022-07-23 NOTE — ED Triage Notes (Signed)
Pt BIB [facility] from [facility] office for CP. Pt got 324 of ASA and 2 ntiros en route with EMS.   EMS VS P 65 BP 200/90 O2 97 RA

## 2022-07-23 NOTE — ED Notes (Signed)
Assumed care of patient. Patient is resting in bed. Call bell is within reach. Daughter has gone home for the night but can be reached by cell phone.

## 2022-07-23 NOTE — ED Notes (Signed)
Patient takes home blood pressure medication QID.

## 2022-07-23 NOTE — H&P (Signed)
PCP:   Geoffry Paradise, MD   Chief Complaint:  Chest pains  HPI: This is pleasant 89 year old female with past medical history of CAD, HTN, HLD, chronic diastolic and systolic heart failure preserved EF, and atrial fibrillation on chronic anticoagulation.  Per patient a few weeks ago she developed intermittent chest pressure and weakness.  Initially her discomfort was every now and then, she paid no attention.  Two  nights. ago she had a rough night, all night. Per patient if she takes a shower after she has to lay back on the recliner for good while to rest as she gets very short of breath.  She reports left arm pain and chest pressure.  She states chest pain and SOB is present when she does her activities at home.  She has had this discomfort before but not like this. This is much worse.  He endorses being lightheaded and dizzy. All this started a few weeks ago. She states she  is compliant w/ her meds.  Review of Systems:  Per HPI  Past Medical History: Past Medical History:  Diagnosis Date   Acute on chronic combined systolic and diastolic CHF (congestive heart failure) (HCC) 01/28/2019   Arthritis    "scattered joints" (07/29/2013)   Chest pain    Colitis    Gout attack ?1980's   Hyperlipidemia    Hypertension    PONV (postoperative nausea and vomiting)    Type II diabetes mellitus (HCC)    Past Surgical History:  Procedure Laterality Date   APPENDECTOMY     CARPAL TUNNEL RELEASE Right    CATARACT EXTRACTION W/ INTRAOCULAR LENS  IMPLANT, BILATERAL Bilateral    CHOLECYSTECTOMY     KNEE ARTHROSCOPY Right    LEFT HEART CATH AND CORONARY ANGIOGRAPHY N/A 04/12/2020   Procedure: LEFT HEART CATH AND CORONARY ANGIOGRAPHY;  Surgeon: Runell Gess, MD;  Location: [facility] INVASIVE CV LAB;  Service: Cardiovascular;  Laterality: N/A;   OLECRANON BURSECTOMY Left 07/29/2013   "in ER"   PARATHYROIDECTOMY     TONSILLECTOMY     TOTAL SHOULDER REPLACEMENT  2017   VAGINAL HYSTERECTOMY       Medications: Prior to Admission medications   Medication Sig Start Date End Date Taking? Authorizing Provider  amLODipine (NORVASC) 10 MG tablet Take 10 mg by mouth at bedtime.    [provider]  carboxymethylcellulose 1 % ophthalmic solution Place 1 drop into both eyes 2 (two) times daily.    [provider]  carvedilol (COREG) 12.5 MG tablet TAKE 1 TABLET BY MOUTH TWICE DAILY WITH A MEAL 02/06/22   Runell Gess, MD  cholecalciferol (VITAMIN D) 1000 UNITS tablet Take 1,000 Units by mouth daily with lunch.    [provider]  cholestyramine (QUESTRAN) 4 g packet Take 4 g by mouth daily as needed (IBS).    [provider]  clindamycin (CLEOCIN) 300 MG capsule Take 1 capsule (300 mg total) by mouth 3 (three) times daily. 11/07/21   Charlynne Pander, MD  cloNIDine (CATAPRES) 0.1 MG tablet TAKE 2 TABLET BY MOUTH EVERY MORNING AND 1 TABLET BY MOUTH EVERY NIGHT AT BEDTIME 07/05/21   Runell Gess, MD  ELIQUIS 5 MG TABS tablet TAKE 1 TABLET BY MOUTH TWICE DAILY 04/08/22   Runell Gess, MD  furosemide (LASIX) 40 MG tablet Take 2 tablets (80 mg total) by mouth daily. 02/07/22   Runell Gess, MD  glipiZIDE (GLUCOTROL XL) 10 MG 24 hr tablet Take 10 mg by  mouth every morning. 01/01/22   [provider]  Lancets Letta Pate ULTRASOFT) lancets use to check blood sugar twice a day as directed 12/18/11   [provider]  metFORMIN (GLUCOPHAGE) 500 MG tablet Take 1 tablet (500 mg total) by mouth 2 (two) times daily with a meal. 04/15/20   Marykay Lex, MD  Multiple Vitamin (MULTIVITAMIN WITH MINERALS) TABS Take 1 tablet by mouth daily with supper.    [provider]  nitroGLYCERIN (NITROSTAT) 0.4 MG SL tablet PLACE 1 TABLET (0.4 MG TOTAL) UNDER THE TONGUE EVERY FIVE MINUTES X 3 DOSES AS NEEDED FOR CHEST PAIN. Patient taking differently: Place 0.4 mg under the tongue every 5 (five) minutes as needed for chest pain. 04/13/20 04/13/21   Marykay Lex, MD  olmesartan (BENICAR) 40 MG tablet Take 40 mg by mouth at bedtime.    Geoffry Paradise, MD  omeprazole (PRILOSEC) 40 MG capsule TAKE 1 CAPSULE BY MOUTH DAILY WITH SUPPER 01/15/22   Runell Gess, MD  [facility] ULTRA test strip USE AS DIRECTED TO TEST BLOOD SUGAR TWICE DAILY 06/29/19   [provider]  potassium chloride SA (KLOR-CON M) 20 MEQ tablet TAKE 1 TABLET BY MOUTH DAILY 05/05/22   Runell Gess, MD  Probiotic Product (PRO-BIOTIC BLEND) CAPS Take 1 capsule by mouth at bedtime. Takes OTC probiotic    Geoffry Paradise, MD  ranolazine (RANEXA) 500 MG 12 hr tablet TAKE 1 TABLET(500 MG) BY MOUTH TWICE DAILY 01/15/22   Runell Gess, MD  rosuvastatin (CRESTOR) 20 MG tablet TAKE 1 TABLET(20 MG) BY MOUTH AT BEDTIME 03/10/22   Runell Gess, MD  sitaGLIPtin (JANUVIA) 100 MG tablet Take 100 mg by mouth daily with lunch.    [provider]    Allergies:   Allergies  Allergen Reactions   Codeine Nausea And Vomiting   Hydrocodone     Nausea and vomiting   Prednisone     Other reaction(s): sick on her stomach   Sulfa Antibiotics Itching, Nausea Only and Other (See Comments)    Social History:  reports that she quit smoking about 69 years ago. Her smoking use included cigarettes. She has never used smokeless tobacco. She reports that she does not drink alcohol and does not use drugs.  Family History: Family History  Problem Relation Age of Onset   Hypertension Father    Heart disease Father    Heart disease Mother    Diabetes Mother    Hypertension Mother    Heart disease Brother        both brothers   Diabetes Brother    Hypertension Brother     Physical Exam: Vitals:   07/23/22 2134 07/23/22 2135 07/23/22 2300 07/23/22 2330  BP: (!) 157/98 (!) 157/98 (!) 173/66 (!) 188/123  Pulse:  (!) 51 (!) 52 (!) 56  Resp:  15 13 15   Temp:      TempSrc:      SpO2:  98% 98% 99%  Weight:      Height:        General:  Alert and oriented  times three, well developed and nourished, no acute distress Eyes: Pink conjunctiva, no scleral icterus ENT: Moist oral mucosa, +1 JVD Lungs: clear to ascultation, no wheeze, no crackles, no use of accessory muscles Cardiovascular: regular rate and rhythm, no regurgitation, no gallops, no murmurs. No carotid bruits, no JVD Abdomen: soft, positive BS, non-tender, non-distended, no organomegaly, not an acute abdomen GU: not examined Neuro: CN II -  XII grossly intact, sensation intact Musculoskeletal: strength 5/5 all extremities. 1+ LE pitting edema Skin: no rash, no subcutaneous crepitation, no decubitus Psych: appropriate patient  No JVD, no LE edema   Labs on Admission:  Recent Labs    07/23/22 1725  NA 137  K 3.8  CL 100  CO2 25  GLUCOSE 163*  BUN 21  CREATININE 1.24*  CALCIUM 9.3    Recent Labs    07/23/22 1725  WBC 7.8  HGB 12.1  HCT 33.9*  MCV 96.0  PLT 205    Recent Labs    07/23/22 2138  DDIMER <0.27    Micro Results: Recent Results (from the past 240 hour(s))  SARS Coronavirus 2 by RT PCR (hospital order, performed in [facility] hospital lab) *cepheid single result test* Anterior Nasal Swab     Status: None   Collection Time: 07/23/22  7:05 PM   Specimen: Anterior Nasal Swab  Result Value Ref Range Status   SARS Coronavirus 2 by RT PCR NEGATIVE NEGATIVE Final    Comment: Performed at [facility], [address], [place], [place] [address]     Radiological Exams on Admission: DG Chest Port 1 View  Result Date: 07/23/2022 CLINICAL DATA:  Chest pressure EXAM: PORTABLE CHEST 1 VIEW COMPARISON:  03/19/2022 FINDINGS: Normal cardiac and mediastinal contours. Aortic atherosclerosis. No focal pulmonary opacity. No pleural effusion or pneumothorax. No acute osseous abnormality. Status post right reverse shoulder arthroplasty. A advanced degenerative changes in the left shoulder. Surgical clips overlie the superior mediastinum. IMPRESSION: No active  disease. Electronically Signed   By: Wiliam Ke M.D.   On: 07/23/2022 17:49    Assessment/Plan Present on Admission:  Coronary artery disease // chest pains -Given patient's age, CAD history, shortness of breath, and chest pain is worse with activity.  Will bring patient in and observe overnight. -Troponins normal. EKG NSR w/o ischemic changes -Patient with history of CAD and CHF.  Treating CHF with IV Lasix -Patient on Ranexa 500 mg p.o. twice daily.  If chest pain with activity persists, consider cardiology consult increase Ranexa -Eliquis continued -Resume Norvasc, Coreg, Benicar, Crestor, and Ranexa -PT consult placed  Acute on chronic diastolic heart failure   -CHF order set initiated -Patient mostly euvolemic.  Patient received 80 mg IV Lasix in the ER.  Will give patient's 20 mg IV in AM.  Reassess after for further  need continued IV Lasix -Strict I's and O's -Daily weights   Essential hypertension -Presenting BP 196/121.  Elevated blood pressure likely contributing to patient's chest pains and developing CHF.  Home medication resumed with first doses now. -As needed hydralazine ordered   Type 2 diabetes mellitus with complication, without long-term current use of insulin (HCC) -Sliding scale insulin ordered  Allison Duffy 07/23/2022, 11:48 PM

## 2022-07-24 ENCOUNTER — Observation Stay (HOSPITAL_COMMUNITY): Payer: Medicare Other

## 2022-07-24 DIAGNOSIS — E785 Hyperlipidemia, unspecified: Secondary | ICD-10-CM | POA: Diagnosis present

## 2022-07-24 DIAGNOSIS — R531 Weakness: Secondary | ICD-10-CM | POA: Diagnosis not present

## 2022-07-24 DIAGNOSIS — Z882 Allergy status to sulfonamides status: Secondary | ICD-10-CM | POA: Diagnosis not present

## 2022-07-24 DIAGNOSIS — E559 Vitamin D deficiency, unspecified: Secondary | ICD-10-CM | POA: Diagnosis not present

## 2022-07-24 DIAGNOSIS — N179 Acute kidney failure, unspecified: Secondary | ICD-10-CM | POA: Diagnosis not present

## 2022-07-24 DIAGNOSIS — Z7901 Long term (current) use of anticoagulants: Secondary | ICD-10-CM | POA: Diagnosis not present

## 2022-07-24 DIAGNOSIS — M1990 Unspecified osteoarthritis, unspecified site: Secondary | ICD-10-CM | POA: Diagnosis present

## 2022-07-24 DIAGNOSIS — I160 Hypertensive urgency: Secondary | ICD-10-CM | POA: Diagnosis present

## 2022-07-24 DIAGNOSIS — I081 Rheumatic disorders of both mitral and tricuspid valves: Secondary | ICD-10-CM | POA: Diagnosis present

## 2022-07-24 DIAGNOSIS — R0789 Other chest pain: Secondary | ICD-10-CM

## 2022-07-24 DIAGNOSIS — Z7401 Bed confinement status: Secondary | ICD-10-CM | POA: Diagnosis not present

## 2022-07-24 DIAGNOSIS — Z8679 Personal history of other diseases of the circulatory system: Secondary | ICD-10-CM | POA: Diagnosis not present

## 2022-07-24 DIAGNOSIS — R0602 Shortness of breath: Secondary | ICD-10-CM | POA: Diagnosis present

## 2022-07-24 DIAGNOSIS — Z515 Encounter for palliative care: Secondary | ICD-10-CM | POA: Diagnosis not present

## 2022-07-24 DIAGNOSIS — I440 Atrioventricular block, first degree: Secondary | ICD-10-CM | POA: Diagnosis present

## 2022-07-24 DIAGNOSIS — R001 Bradycardia, unspecified: Secondary | ICD-10-CM | POA: Diagnosis present

## 2022-07-24 DIAGNOSIS — I2510 Atherosclerotic heart disease of native coronary artery without angina pectoris: Secondary | ICD-10-CM | POA: Diagnosis present

## 2022-07-24 DIAGNOSIS — Z87891 Personal history of nicotine dependence: Secondary | ICD-10-CM | POA: Diagnosis not present

## 2022-07-24 DIAGNOSIS — I4891 Unspecified atrial fibrillation: Secondary | ICD-10-CM | POA: Diagnosis not present

## 2022-07-24 DIAGNOSIS — I5033 Acute on chronic diastolic (congestive) heart failure: Secondary | ICD-10-CM | POA: Diagnosis present

## 2022-07-24 DIAGNOSIS — E892 Postprocedural hypoparathyroidism: Secondary | ICD-10-CM | POA: Diagnosis present

## 2022-07-24 DIAGNOSIS — Z1152 Encounter for screening for COVID-19: Secondary | ICD-10-CM | POA: Diagnosis not present

## 2022-07-24 DIAGNOSIS — E876 Hypokalemia: Secondary | ICD-10-CM | POA: Diagnosis present

## 2022-07-24 DIAGNOSIS — R079 Chest pain, unspecified: Secondary | ICD-10-CM

## 2022-07-24 DIAGNOSIS — I4821 Permanent atrial fibrillation: Secondary | ICD-10-CM | POA: Diagnosis present

## 2022-07-24 DIAGNOSIS — J9601 Acute respiratory failure with hypoxia: Secondary | ICD-10-CM | POA: Diagnosis not present

## 2022-07-24 DIAGNOSIS — I110 Hypertensive heart disease with heart failure: Secondary | ICD-10-CM | POA: Diagnosis present

## 2022-07-24 DIAGNOSIS — Z66 Do not resuscitate: Secondary | ICD-10-CM | POA: Diagnosis present

## 2022-07-24 DIAGNOSIS — H04123 Dry eye syndrome of bilateral lacrimal glands: Secondary | ICD-10-CM | POA: Diagnosis not present

## 2022-07-24 DIAGNOSIS — E118 Type 2 diabetes mellitus with unspecified complications: Secondary | ICD-10-CM | POA: Diagnosis not present

## 2022-07-24 DIAGNOSIS — E1165 Type 2 diabetes mellitus with hyperglycemia: Secondary | ICD-10-CM | POA: Diagnosis present

## 2022-07-24 DIAGNOSIS — Z7189 Other specified counseling: Secondary | ICD-10-CM | POA: Diagnosis not present

## 2022-07-24 DIAGNOSIS — I10 Essential (primary) hypertension: Secondary | ICD-10-CM | POA: Diagnosis not present

## 2022-07-24 DIAGNOSIS — K589 Irritable bowel syndrome without diarrhea: Secondary | ICD-10-CM | POA: Diagnosis not present

## 2022-07-24 DIAGNOSIS — Z885 Allergy status to narcotic agent status: Secondary | ICD-10-CM | POA: Diagnosis not present

## 2022-07-24 DIAGNOSIS — I350 Nonrheumatic aortic (valve) stenosis: Secondary | ICD-10-CM | POA: Diagnosis not present

## 2022-07-24 DIAGNOSIS — Z79899 Other long term (current) drug therapy: Secondary | ICD-10-CM | POA: Diagnosis not present

## 2022-07-24 DIAGNOSIS — K219 Gastro-esophageal reflux disease without esophagitis: Secondary | ICD-10-CM | POA: Diagnosis not present

## 2022-07-24 DIAGNOSIS — R42 Dizziness and giddiness: Secondary | ICD-10-CM | POA: Diagnosis not present

## 2022-07-24 DIAGNOSIS — Z7984 Long term (current) use of oral hypoglycemic drugs: Secondary | ICD-10-CM | POA: Diagnosis not present

## 2022-07-24 LAB — GLUCOSE, CAPILLARY
Glucose-Capillary: 180 mg/dL — ABNORMAL HIGH (ref 70–99)
Glucose-Capillary: 199 mg/dL — ABNORMAL HIGH (ref 70–99)
Glucose-Capillary: 183 mg/dL — ABNORMAL HIGH (ref 70–99)
Glucose-Capillary: 152 mg/dL — ABNORMAL HIGH (ref 70–99)

## 2022-07-24 LAB — CBC WITH DIFFERENTIAL/PLATELET
Abs Immature Granulocytes: 0.02 10*3/uL (ref 0.00–0.07)
Basophils Absolute: 0.1 10*3/uL (ref 0.0–0.1)
Basophils Relative: 1 %
Eosinophils Absolute: 0.2 10*3/uL (ref 0.0–0.5)
Eosinophils Relative: 2 %
HCT: 36.8 % (ref 36.0–46.0)
Hemoglobin: 13.3 g/dL (ref 12.0–15.0)
Immature Granulocytes: 0 %
Lymphocytes Relative: 29 %
Lymphs Abs: 2.1 10*3/uL (ref 0.7–4.0)
MCH: 34.1 pg — ABNORMAL HIGH (ref 26.0–34.0)
MCHC: 36.1 g/dL — ABNORMAL HIGH (ref 30.0–36.0)
MCV: 94.4 fL (ref 80.0–100.0)
Monocytes Absolute: 0.7 10*3/uL (ref 0.1–1.0)
Monocytes Relative: 9 %
Neutro Abs: 4.3 10*3/uL (ref 1.7–7.7)
Neutrophils Relative %: 59 %
Platelets: 200 10*3/uL (ref 150–400)
RBC: 3.9 MIL/uL (ref 3.87–5.11)
RDW: 11.6 % (ref 11.5–15.5)
WBC: 7.4 10*3/uL (ref 4.0–10.5)
nRBC: 0 % (ref 0.0–0.2)

## 2022-07-24 LAB — ECHOCARDIOGRAM COMPLETE
Area-P 1/2: 1.54 cm2
Calc EF: 67.4 %
Height: 66 in
MV M vel: 4.84 m/s
MV Peak grad: 93.7 mmHg
MV VTI: 2.37 cm2
S' Lateral: 2.7 cm
Single Plane A2C EF: 68.8 %
Single Plane A4C EF: 66.3 %
Weight: 2312 oz

## 2022-07-24 LAB — MAGNESIUM: Magnesium: 1.5 mg/dL — ABNORMAL LOW (ref 1.7–2.4)

## 2022-07-24 MED ORDER — INSULIN ASPART 100 UNIT/ML IJ SOLN
0.0000 [IU] | Freq: Three times a day (TID) | INTRAMUSCULAR | Status: DC
  Administered 2022-07-24 (×3): 3 [IU] via SUBCUTANEOUS
  Administered 2022-07-25: 5 [IU] via SUBCUTANEOUS
  Administered 2022-07-25 – 2022-07-26 (×3): 3 [IU] via SUBCUTANEOUS
  Administered 2022-07-26: 5 [IU] via SUBCUTANEOUS
  Administered 2022-07-26 – 2022-07-27 (×3): 3 [IU] via SUBCUTANEOUS
  Administered 2022-07-27: 8 [IU] via SUBCUTANEOUS
  Administered 2022-07-28: 3 [IU] via SUBCUTANEOUS
  Administered 2022-07-28: 8 [IU] via SUBCUTANEOUS
  Administered 2022-07-28: 3 [IU] via SUBCUTANEOUS
  Administered 2022-07-29 (×3): 5 [IU] via SUBCUTANEOUS
  Administered 2022-07-30: 3 [IU] via SUBCUTANEOUS
  Administered 2022-07-30: 5 [IU] via SUBCUTANEOUS

## 2022-07-24 MED ORDER — HYDRALAZINE HCL 10 MG PO TABS
10.0000 mg | ORAL_TABLET | Freq: Three times a day (TID) | ORAL | Status: DC
  Administered 2022-07-24 – 2022-07-27 (×9): 10 mg via ORAL
  Filled 2022-07-24 (×9): qty 1

## 2022-07-24 MED ORDER — FUROSEMIDE 40 MG PO TABS: 40.0000 mg | ORAL_TABLET | Freq: Every day | ORAL | Status: DC

## 2022-07-24 MED ORDER — ACETAMINOPHEN 325 MG PO TABS: 650.0000 mg | ORAL_TABLET | ORAL | Status: DC | PRN

## 2022-07-24 MED ORDER — APIXABAN 5 MG PO TABS
5.0000 mg | ORAL_TABLET | Freq: Two times a day (BID) | ORAL | Status: DC
  Administered 2022-07-24 – 2022-07-28 (×9): 5 mg via ORAL
  Filled 2022-07-24 (×9): qty 1

## 2022-07-24 MED ORDER — CARVEDILOL 12.5 MG PO TABS
12.5000 mg | ORAL_TABLET | Freq: Two times a day (BID) | ORAL | Status: DC
  Administered 2022-07-24: 12.5 mg via ORAL
  Filled 2022-07-24: qty 1

## 2022-07-24 MED ORDER — ONDANSETRON HCL 4 MG/2ML IJ SOLN: 4.0000 mg | Freq: Four times a day (QID) | INTRAMUSCULAR | Status: DC | PRN

## 2022-07-24 MED ORDER — SODIUM CHLORIDE 0.9 % IV SOLN: 250.0000 mL | INTRAVENOUS | Status: DC | PRN

## 2022-07-24 MED ORDER — ROSUVASTATIN CALCIUM 20 MG PO TABS
20.0000 mg | ORAL_TABLET | Freq: Every day | ORAL | Status: DC
  Administered 2022-07-24 – 2022-07-30 (×7): 20 mg via ORAL
  Filled 2022-07-24 (×7): qty 1

## 2022-07-24 MED ORDER — RISAQUAD PO CAPS
1.0000 | ORAL_CAPSULE | Freq: Every day | ORAL | Status: DC
  Administered 2022-07-25 – 2022-07-29 (×6): 1 via ORAL
  Filled 2022-07-24 (×6): qty 1

## 2022-07-24 MED ORDER — IRBESARTAN 300 MG PO TABS
300.0000 mg | ORAL_TABLET | Freq: Every day | ORAL | Status: DC
  Administered 2022-07-24 – 2022-07-30 (×7): 300 mg via ORAL
  Filled 2022-07-24 (×9): qty 1

## 2022-07-24 MED ORDER — CLONIDINE HCL 0.1 MG PO TABS
0.1000 mg | ORAL_TABLET | Freq: Every day | ORAL | Status: DC
  Administered 2022-07-25 – 2022-07-29 (×6): 0.1 mg via ORAL
  Filled 2022-07-24 (×6): qty 1

## 2022-07-24 MED ORDER — SODIUM CHLORIDE 0.9% FLUSH
3.0000 mL | Freq: Two times a day (BID) | INTRAVENOUS | Status: DC
  Administered 2022-07-24 – 2022-07-30 (×11): 3 mL via INTRAVENOUS

## 2022-07-24 MED ORDER — SODIUM CHLORIDE 0.9% FLUSH: 3.0000 mL | INTRAVENOUS | Status: DC | PRN

## 2022-07-24 MED ORDER — HYDRALAZINE HCL 20 MG/ML IJ SOLN
5.0000 mg | INTRAMUSCULAR | Status: DC | PRN
  Administered 2022-07-24 (×2): 5 mg via INTRAVENOUS
  Filled 2022-07-24 (×2): qty 1

## 2022-07-24 MED ORDER — CLONIDINE HCL 0.2 MG PO TABS
0.2000 mg | ORAL_TABLET | Freq: Every morning | ORAL | Status: DC
  Administered 2022-07-25 – 2022-07-30 (×6): 0.2 mg via ORAL
  Filled 2022-07-24 (×6): qty 1

## 2022-07-24 MED ORDER — RANOLAZINE ER 500 MG PO TB12
500.0000 mg | ORAL_TABLET | Freq: Two times a day (BID) | ORAL | Status: DC
  Administered 2022-07-24 – 2022-07-30 (×13): 500 mg via ORAL
  Filled 2022-07-24 (×13): qty 1

## 2022-07-24 MED ORDER — INSULIN ASPART 100 UNIT/ML IJ SOLN
0.0000 [IU] | Freq: Every day | INTRAMUSCULAR | Status: DC
  Administered 2022-07-26 – 2022-07-28 (×3): 2 [IU] via SUBCUTANEOUS

## 2022-07-24 MED ORDER — MAGNESIUM SULFATE 2 GM/50ML IV SOLN
2.0000 g | Freq: Once | INTRAVENOUS | Status: AC
  Administered 2022-07-24: 2 g via INTRAVENOUS
  Filled 2022-07-24: qty 50

## 2022-07-24 MED ORDER — FUROSEMIDE 10 MG/ML IJ SOLN
20.0000 mg | Freq: Two times a day (BID) | INTRAMUSCULAR | Status: DC
  Administered 2022-07-24: 20 mg via INTRAVENOUS
  Filled 2022-07-24: qty 2

## 2022-07-24 MED ORDER — CARVEDILOL 6.25 MG PO TABS
6.2500 mg | ORAL_TABLET | Freq: Two times a day (BID) | ORAL | Status: DC
  Administered 2022-07-24 – 2022-07-26 (×4): 6.25 mg via ORAL
  Filled 2022-07-24 (×4): qty 1

## 2022-07-24 MED ORDER — MAGNESIUM SULFATE 2 GM/50ML IV SOLN: 2.0000 g | Freq: Once | INTRAVENOUS | Status: DC

## 2022-07-24 NOTE — ED Notes (Signed)
ED TO INPATIENT HANDOFF REPORT  ED Nurse Name and Phone #: Kathryne Hitch [phone]  S Name/Age/Gender Allison Duffy 89 y.o. female Room/Bed: 020C/020C  Code Status   Code Status: Prior  Home/SNF/Other Home Patient oriented to: self, place, time, and situation Is this baseline? Yes   Triage Complete: Triage complete  Chief Complaint Atypical chest pain [R07.89]  Triage Note Pt BIB [facility] from doctor's office for CP. Pt got 324 of ASA and 2 ntiros en route with EMS.   EMS VS P 65 BP 200/90 O2 97 RA    Allergies Allergies  Allergen Reactions   Codeine Nausea And Vomiting   Hydrocodone     Nausea and vomiting   Prednisone     Other reaction(s): sick on her stomach   Sulfa Antibiotics Itching, Nausea Only and Other (See Comments)    Level of Care/Admitting Diagnosis ED Disposition     ED Disposition  Admit   Condition  --   Comment  Hospital Area: [facility] [[id number]]  Level of Care: Telemetry Cardiac [[id number]]  May place patient in observation at [facility] or [facility] if equivalent level of care is available:: No  Covid Evaluation: Confirmed COVID Negative  Diagnosis: Atypical chest pain [454098]  Admitting Physician: Gery Pray [[id number]  Attending Physician: Alvester Chou          B Medical/Surgery History Past Medical History:  Diagnosis Date   Acute on chronic combined systolic and diastolic CHF (congestive heart failure) (HCC) 01/28/2019   Arthritis    "scattered joints" (07/29/2013)   Chest pain    Colitis    Gout attack ?1980's   Hyperlipidemia    Hypertension    PONV (postoperative nausea and vomiting)    Type II diabetes mellitus (HCC)    Past Surgical History:  Procedure Laterality Date   APPENDECTOMY     CARPAL TUNNEL RELEASE Right    CATARACT EXTRACTION W/ INTRAOCULAR LENS  IMPLANT, BILATERAL Bilateral    CHOLECYSTECTOMY     KNEE ARTHROSCOPY Right    LEFT HEART CATH AND CORONARY ANGIOGRAPHY N/A  04/12/2020   Procedure: LEFT HEART CATH AND CORONARY ANGIOGRAPHY;  Surgeon: Runell Gess, MD;  Location: [facility];  Service: Cardiovascular;  Laterality: N/A;   OLECRANON BURSECTOMY Left 07/29/2013   "in ER"   PARATHYROIDECTOMY     TONSILLECTOMY     TOTAL SHOULDER REPLACEMENT  2017   VAGINAL HYSTERECTOMY       A IV Location/Drains/Wounds Patient Lines/Drains/Airways Status     Active Line/Drains/Airways     Name Placement date Placement time Site Days   Peripheral IV 07/23/22 18 G Anterior;Left Forearm 07/23/22  1653  Forearm  1   External Urinary Catheter 07/23/22  1942  --  1            Intake/Output Last 24 hours  Intake/Output Summary (Last 24 hours) at 07/24/2022 0056 Last data filed at 07/23/2022 2136 Gross per 24 hour  Intake --  Output 1000 ml  Net -1000 ml    Labs/Imaging Results for orders placed or performed during the hospital encounter of 07/23/22 (from the past 48 hour(s))  Basic metabolic panel     Status: Abnormal   Collection Time: 07/23/22  5:25 PM  Result Value Ref Range   Sodium 137 135 - 145 mmol/L   Potassium 3.8 3.5 - 5.1 mmol/L    Comment: HEMOLYSIS AT THIS LEVEL MAY AFFECT RESULT   Chloride 100 98 -  111 mmol/L   CO2 25 22 - 32 mmol/L   Glucose, Bld 163 (H) 70 - 99 mg/dL    Comment: Glucose reference range applies only to samples taken after fasting for at least 8 hours.   BUN 21 8 - 23 mg/dL   Creatinine, Ser 4.09 (H) 0.44 - 1.00 mg/dL   Calcium 9.3 8.9 - 81.1 mg/dL   GFR, Estimated 40 (L) >60 mL/min    Comment: (NOTE) Calculated using the CKD-EPI Creatinine Equation (2021)    Anion gap 12 5 - 15    Comment: Performed at [facility], [address] [place], [place] [address]  CBC     Status: Abnormal   Collection Time: 07/23/22  5:25 PM  Result Value Ref Range   WBC 7.8 4.0 - 10.5 K/uL   RBC 3.53 (L) 3.87 - 5.11 MIL/uL   Hemoglobin 12.1 12.0 - 15.0 g/dL   HCT 29.5 (L) 62.1 - 30.8 %   MCV 96.0 80.0 - 100.0 fL    MCH 34.3 (H) 26.0 - 34.0 pg   MCHC 35.7 30.0 - 36.0 g/dL   RDW 65.7 84.6 - 96.2 %   Platelets 205 150 - 400 K/uL   nRBC 0.0 0.0 - 0.2 %    Comment: Performed at [facility], [address] [place], [place] [address]  Troponin I (High Sensitivity)     Status: None   Collection Time: 07/23/22  5:25 PM  Result Value Ref Range   Troponin I (High Sensitivity) 13 <18 ng/L    Comment: (NOTE) Elevated high sensitivity troponin I (hsTnI) values and significant  changes across serial measurements may suggest ACS but many other  chronic and acute conditions are known to elevate hsTnI results.  Refer to the "Links" section for chest pain algorithms and additional  guidance. Performed at [facility], [address], [place], [place] [address]   SARS Coronavirus 2 by RT PCR (hospital order, performed in [facility] hospital lab) *cepheid single result test* Anterior Nasal Swab     Status: None   Collection Time: 07/23/22  7:05 PM   Specimen: Anterior Nasal Swab  Result Value Ref Range   SARS Coronavirus 2 by RT PCR NEGATIVE NEGATIVE    Comment: Performed at [facility], [address] [place], [place] [address]  Urinalysis, Routine w reflex microscopic -Urine, Clean Catch     Status: Abnormal   Collection Time: 07/23/22  7:06 PM  Result Value Ref Range   Color, Urine STRAW (A) YELLOW   APPearance CLEAR CLEAR   Specific Gravity, Urine 1.005 1.005 - 1.030   pH 7.0 5.0 - 8.0   Glucose, UA NEGATIVE NEGATIVE mg/dL   Hgb urine dipstick SMALL (A) NEGATIVE   Bilirubin Urine NEGATIVE NEGATIVE   Ketones, ur NEGATIVE NEGATIVE mg/dL   Protein, ur 30 (A) NEGATIVE mg/dL   Nitrite NEGATIVE NEGATIVE   Leukocytes,Ua NEGATIVE NEGATIVE   RBC / HPF 0-5 0 - 5 RBC/hpf   WBC, UA 0-5 0 - 5 WBC/hpf   Bacteria, UA NONE SEEN NONE SEEN   Squamous Epithelial / HPF 0-5 0 - 5 /HPF    Comment: Performed at [facility], [address] [place], [place] [address]  Troponin I (High  Sensitivity)     Status: None   Collection Time: 07/23/22  7:25 PM  Result Value Ref Range   Troponin I (High Sensitivity) 15 <18 ng/L    Comment: (NOTE)  Elevated high sensitivity troponin I (hsTnI) values and significant  changes across serial measurements may suggest ACS but many other  chronic and acute conditions are known to elevate hsTnI results.  Refer to the "Links" section for chest pain algorithms and additional  guidance. Performed at [facility], [address] [place], [place] [address]   Brain natriuretic peptide     Status: Abnormal   Collection Time: 07/23/22  7:25 PM  Result Value Ref Range   B Natriuretic Peptide 191.7 (H) 0.0 - 100.0 pg/mL    Comment: Performed at [facility], [address] [place], [place] [address]  CBG monitoring, ED     Status: Abnormal   Collection Time: 07/23/22  7:31 PM  Result Value Ref Range   Glucose-Capillary 141 (H) 70 - 99 mg/dL    Comment: Glucose reference range applies only to samples taken after fasting for at least 8 hours.  D-dimer, quantitative     Status: None   Collection Time: 07/23/22  9:38 PM  Result Value Ref Range   D-Dimer, Quant <0.27 0.00 - 0.50 ug/mL-FEU    Comment: (NOTE) At the manufacturer cut-off value of 0.5 g/mL FEU, this assay has a negative predictive value of 95-100%.This assay is intended for use in conjunction with a clinical pretest probability (PTP) assessment model to exclude pulmonary embolism (PE) and deep venous thrombosis (DVT) in outpatients suspected of PE or DVT. Results should be correlated with clinical presentation. Performed at [facility], [address], [place], [place] [address]    DG Chest Port 1 View  Result Date: 07/23/2022 CLINICAL DATA:  Chest pressure EXAM: PORTABLE CHEST 1 VIEW COMPARISON:  03/19/2022 FINDINGS: Normal cardiac and mediastinal contours. Aortic atherosclerosis. No focal pulmonary opacity. No pleural effusion or pneumothorax. No acute  osseous abnormality. Status post right reverse shoulder arthroplasty. A advanced degenerative changes in the left shoulder. Surgical clips overlie the superior mediastinum. IMPRESSION: No active disease. Electronically Signed   By: Wiliam Ke M.D.   On: 07/23/2022 17:49    Pending Labs Unresulted Labs (From admission, onward)    None       Vitals/Pain Today's Vitals   07/23/22 2300 07/23/22 2330 07/24/22 0000 07/24/22 0030  BP: (!) 173/66 (!) 188/123 (!) 173/80 (!) 177/76  Pulse: (!) 52 (!) 56 (!) 56 62  Resp: 13 15 16 14   Temp:      TempSrc:      SpO2: 98% 99% 100% 96%  Weight:      Height:      PainSc:        Isolation Precautions No active isolations  Medications Medications  amLODipine (NORVASC) tablet 10 mg (10 mg Oral Given 07/23/22 2134)  cloNIDine (CATAPRES) tablet 0.1 mg (0.1 mg Oral Given 07/23/22 1925)  furosemide (LASIX) injection 80 mg (80 mg Intravenous Given 07/23/22 1925)    Mobility walks with device     Focused Assessments Cardiac Assessment Handoff:  Cardiac Rhythm: Normal sinus rhythm No results found for: "CKTOTAL", "CKMB", "CKMBINDEX", "TROPONINI" Lab Results  Component Value Date   DDIMER <0.27 07/23/2022   Does the Patient currently have chest pain? No    R Recommendations: See Admitting Provider Note  Report given to:   Additional Notes:    Patient denies chest pain at this time.

## 2022-07-24 NOTE — Consult Note (Addendum)
Cardiology Consultation   Patient ID: ANGELYSE HESLIN MRN: [id number]; DOB: 02/07/1926  Admit date: 07/23/2022 Date of Consult: 07/24/2022  PCP:  Geoffry Paradise, MD   [facility] Providers Cardiologist:  Nanetta Batty, MD        Patient Profile:   Allison Duffy is a 89 y.o. female with a hx of combined HFpEF and HFrEF, non obstructive CAD on cath 2022, HTN, HLD, DM2, colitis, who is being seen 07/24/2022 for the evaluation of chest pain and DOE at the request of Dr. Benjamine Mola.  History of Present Illness:   Ms. Percle with above hx and cardiac CTA in 2022 with Ca+ score of 2614 and Cardiac cath 04/12/2020 with >50% LM so cardiac cath was done.   RCA patent, LAD with 50% stenosis and 2nd diag with 75% stenosis.  Ramus 50% stenosis.   Echo 03/2021 with EF 55-60%, nor RWMA, RV normal. Estimated RV systolic pressure 48.4, LA severely dilated, mild to mod MR, mild to mod TR.  Has had persistent atrial fib/flutter since 2020 or 2021 on eliquis.     Pt presented to ER yesterday from MD office (PCP) by EMS for chest pain,  she rec'd ASA 324 and 2 NTG sl en route.  BP per EMS was 220/100 on their arrival.   SR rare PVC.    She developed chest pressure a few weeks ago, intermittent, and weakness.  It was not frequent until 3 nights ago had pain most of night.  Also with dyspnea.  Now difficult to take shower without being SOB and having to rest.  She has been lightheaded and dizzy as well.  She has not missed medications.  BP in ER 186/112 P 60s afebrile and R 13-18  RA sp02 100%.   Given clonidine last pm and lasix 80 mg   Hs troponin 13 and 15 BNP 191 Ddimer <0.27 Na 137 K+ 3.8 glucose 163 Cr 1.24 GFR 40  WBC 7.8 Hgb 12.1 plts 205  PCXR NAD   EKG:  The EKG was personally reviewed and demonstrates:  SR with 1st degree AV block 255 ms  no flutter waves seen, no acute ST changes Telemetry:  Telemetry was personally reviewed and demonstrates:  SR to SB down to 52 with 1st degree AV block   no atrial fib  Echo today EF 60-65%, no RWMA, mod basal septal hypertrophy, mild concentric LVH G2 DD  RV is normal.  LA mod dilated, mild MR    BP 193/69 P 51 R 18   Past Medical History:  Diagnosis Date   Acute on chronic combined systolic and diastolic CHF (congestive heart failure) (HCC) 01/28/2019   Arthritis    "scattered joints" (07/29/2013)   Chest pain    Colitis    Gout attack ?1980's   Hyperlipidemia    Hypertension    PONV (postoperative nausea and vomiting)    Type II diabetes mellitus (HCC)     Past Surgical History:  Procedure Laterality Date   APPENDECTOMY     CARPAL TUNNEL RELEASE Right    CATARACT EXTRACTION W/ INTRAOCULAR LENS  IMPLANT, BILATERAL Bilateral    CHOLECYSTECTOMY     KNEE ARTHROSCOPY Right    LEFT HEART CATH AND CORONARY ANGIOGRAPHY N/A 04/12/2020   Procedure: LEFT HEART CATH AND CORONARY ANGIOGRAPHY;  Surgeon: Runell Gess, MD;  Location: [facility];  Service: Cardiovascular;  Laterality: N/A;   OLECRANON BURSECTOMY Left 07/29/2013   "in ER"   PARATHYROIDECTOMY  TONSILLECTOMY     TOTAL SHOULDER REPLACEMENT  2017   VAGINAL HYSTERECTOMY       Home Medications:  Prior to Admission medications   Medication Sig Start Date End Date Taking? Authorizing Provider  amLODipine (NORVASC) 10 MG tablet Take 10 mg by mouth at bedtime.    [provider]  carboxymethylcellulose 1 % ophthalmic solution Place 1 drop into both eyes 2 (two) times daily.    [provider]  carvedilol (COREG) 12.5 MG tablet TAKE 1 TABLET BY MOUTH TWICE DAILY WITH A MEAL 02/06/22   Runell Gess, MD  cholecalciferol (VITAMIN D) 1000 UNITS tablet Take 1,000 Units by mouth daily with lunch.    [provider]  cholestyramine (QUESTRAN) 4 g packet Take 4 g by mouth daily as needed (IBS).    [provider]  clindamycin (CLEOCIN) 300 MG capsule Take 1 capsule (300 mg total) by mouth 3 (three) times daily. 11/07/21   Charlynne Pander, MD  cloNIDine (CATAPRES) 0.1 MG tablet TAKE 2 TABLET BY MOUTH EVERY MORNING AND 1 TABLET BY MOUTH EVERY NIGHT AT BEDTIME 07/05/21   Runell Gess, MD  ELIQUIS 5 MG TABS tablet TAKE 1 TABLET BY MOUTH TWICE DAILY 04/08/22   Runell Gess, MD  furosemide (LASIX) 40 MG tablet Take 2 tablets (80 mg total) by mouth daily. 02/07/22   Runell Gess, MD  glipiZIDE (GLUCOTROL XL) 10 MG 24 hr tablet Take 10 mg by mouth every morning. 01/01/22   [provider]  Lancets Letta Pate ULTRASOFT) lancets use to check blood sugar twice a day as directed 12/18/11   [provider]  metFORMIN (GLUCOPHAGE) 500 MG tablet Take 1 tablet (500 mg total) by mouth 2 (two) times daily with a meal. 04/15/20   Marykay Lex, MD  Multiple Vitamin (MULTIVITAMIN WITH MINERALS) TABS Take 1 tablet by mouth daily with supper.    [provider]  nitroGLYCERIN (NITROSTAT) 0.4 MG SL tablet PLACE 1 TABLET (0.4 MG TOTAL) UNDER THE TONGUE EVERY FIVE MINUTES X 3 DOSES AS NEEDED FOR CHEST PAIN. Patient taking differently: Place 0.4 mg under the tongue every 5 (five) minutes as needed for chest pain. 04/13/20 04/13/21  Marykay Lex, MD  olmesartan (BENICAR) 40 MG tablet Take 40 mg by mouth at bedtime.    Geoffry Paradise, MD  omeprazole (PRILOSEC) 40 MG capsule TAKE 1 CAPSULE BY MOUTH DAILY WITH SUPPER 01/15/22   Runell Gess, MD  [facility] ULTRA test strip USE AS DIRECTED TO TEST BLOOD SUGAR TWICE DAILY 06/29/19   [provider]  potassium chloride SA (KLOR-CON M) 20 MEQ tablet TAKE 1 TABLET BY MOUTH DAILY 05/05/22   Runell Gess, MD  Probiotic Product (PRO-BIOTIC BLEND) CAPS Take 1 capsule by mouth at bedtime. Takes OTC probiotic    Geoffry Paradise, MD  ranolazine (RANEXA) 500 MG 12 hr tablet TAKE 1 TABLET(500 MG) BY MOUTH TWICE DAILY 01/15/22   Runell Gess, MD  rosuvastatin (CRESTOR) 20 MG tablet TAKE 1 TABLET(20 MG) BY MOUTH AT BEDTIME 03/10/22   Runell Gess, MD   sitaGLIPtin (JANUVIA) 100 MG tablet Take 100 mg by mouth daily with lunch.    [provider]    Inpatient Medications: Scheduled Meds:  acidophilus  1 capsule Oral QHS   amLODipine  10 mg Oral QHS   apixaban  5 mg Oral BID   carvedilol  12.5 mg Oral BID WC   furosemide  20 mg Intravenous  Q12H   insulin aspart  0-15 Units Subcutaneous TID WC   insulin aspart  0-5 Units Subcutaneous QHS   ranolazine  500 mg Oral BID   rosuvastatin  20 mg Oral Daily   sodium chloride flush  3 mL Intravenous Q12H   Continuous Infusions:  sodium chloride     PRN Meds: sodium chloride, acetaminophen, hydrALAZINE, ondansetron (ZOFRAN) IV, sodium chloride flush  Allergies:    Allergies  Allergen Reactions   Codeine Nausea And Vomiting   Hydrocodone     Nausea and vomiting   Prednisone     Other reaction(s): sick on her stomach   Sulfa Antibiotics Itching, Nausea Only and Other (See Comments)    Social History:   Social History   Socioeconomic History   Marital status: Widowed    Spouse name: 4 children and 3 living   Number of children: 4   Years of education: Not on file   Highest education level: Not on file  Occupational History   Occupation: Retired  Tobacco Use   Smoking status: Former    Years: 5    Types: Cigarettes    Quit date: 07/05/1953    Years since quitting: 69.0   Smokeless tobacco: Never  Vaping Use   Vaping Use: Never used  Substance and Sexual Activity   Alcohol use: No   Drug use: No   Sexual activity: Not Currently  Other Topics Concern   Not on file  Social History Narrative   Lives independently in her own home. Daughter, [name], is 5 min. away and her grandson lives next door.   Social Determinants of Health   Financial Resource Strain: Not on file  Food Insecurity: No Food Insecurity (07/24/2022)   Hunger Vital Sign    Worried About Running Out of Food in the Last Year: Never true    Ran Out of Food in the Last Year: Never true   Transportation Needs: No Transportation Needs (07/24/2022)   PRAPARE - Administrator, Civil Service (Medical): No    Lack of Transportation (Non-Medical): No  Physical Activity: Not on file  Stress: Not on file  Social Connections: Not on file  Intimate Partner Violence: Not At Risk (07/24/2022)   Humiliation, Afraid, Rape, and Kick questionnaire    Fear of Current or Ex-Partner: No    Emotionally Abused: No    Physically Abused: No    Sexually Abused: No    Family History:    Family History  Problem Relation Age of Onset   Hypertension Father    Heart disease Father    Heart disease Mother    Diabetes Mother    Hypertension Mother    Heart disease Brother        both brothers   Diabetes Brother    Hypertension Brother      ROS:  Please see the history of present illness.  General:no colds or fevers, no weight changes Skin:no rashes or ulcers HEENT:no blurred vision, no congestion CV:see HPI PUL:see HPI GI:no diarrhea constipation or melena, no indigestion GU:no hematuria, no dysuria MS:no joint pain, no claudication Neuro:no syncope, no lightheadedness Endo:+ diabetes, no thyroid disease  All other ROS reviewed and negative.     Physical Exam/Data:   Vitals:   07/24/22 0600 07/24/22 0615 07/24/22 0645 07/24/22 0719  BP: (!) 172/70  (!) 172/68 (!) 172/65  Pulse:    (!) 55  Resp:    18  Temp:    97.7 F (36.5 C)  TempSrc:    Oral  SpO2:    98%  Weight:  65.5 kg    Height:        Intake/Output Summary (Last 24 hours) at 07/24/2022 1119 Last data filed at 07/24/2022 1028 Gross per 24 hour  Intake 440 ml  Output 1850 ml  Net -1410 ml      07/24/2022    6:15 AM 07/23/2022    4:57 PM 10/08/2021   10:24 AM  Last 3 Weights  Weight (lbs) 144 lb 8 oz 149 lb 156 lb 6.4 oz  Weight (kg) 65.545 kg 67.586 kg 70.943 kg     Body mass index is 23.32 kg/m.  Exam per Dr. Rennis Golden General:  Well nourished, well developed, in no acute distress HEENT:  normal Neck: no JVD Vascular: No carotid bruits; Distal pulses 2+ bilaterally Cardiac:  normal S1, S2; RRR; no murmur  Lungs:  clear to auscultation bilaterally, no wheezing, rhonchi or rales  Abd: soft, nontender, no hepatomegaly  Ext: no edema Musculoskeletal:  No deformities, BUE and BLE strength normal and equal Skin: warm and dry  Neuro:  CNs 2-12 intact, no focal abnormalities noted Psych:  Normal affect     Relevant CV Studies: Echo 07/24/22 IMPRESSIONS     1. Left ventricular ejection fraction, by estimation, is 60 to 65%. The  left ventricle has normal function. The left ventricle has no regional  wall motion abnormalities. There is moderate basal septal hypertrophy. The  rest of the LV segments demonstrate   mild concentric left ventricular hypertrophy of the basal-septal segment.  Left ventricular diastolic parameters are consistent with Grade II  diastolic dysfunction (pseudonormalization).   2. Right ventricular systolic function is normal. The right ventricular  size is normal.   3. Left atrial size was moderately dilated.   4. The mitral valve is grossly normal. Mild mitral valve regurgitation.   5. The aortic valve is tricuspid. There is mild calcification of the  aortic valve. There is mild thickening of the aortic valve. Aortic valve  regurgitation is not visualized. Aortic valve sclerosis/calcification is  present, without any evidence of  aortic stenosis.   6. The inferior vena cava is normal in size with greater than 50%  respiratory variability, suggesting right atrial pressure of 3 mmHg.   Comparison(s): No significant change from prior study.   FINDINGS   Left Ventricle: Left ventricular ejection fraction, by estimation, is 60  to 65%. The left ventricle has normal function. The left ventricle has no  regional wall motion abnormalities. The left ventricular internal cavity  size was normal in size. There is   moderate basal septal hypertrophy. The  rest of the LV segments  demonstrate mild concentric left ventricular hypertrophy of the  basal-septal segment. Left ventricular diastolic parameters are consistent  with Grade II diastolic dysfunction  (pseudonormalization).   Right Ventricle: The right ventricular size is normal. No increase in  right ventricular wall thickness. Right ventricular systolic function is  normal.   Left Atrium: Left atrial size was moderately dilated.   Right Atrium: Right atrial size was normal in size.   Pericardium: There is no evidence of pericardial effusion.   Mitral Valve: The mitral valve is grossly normal. There is mild thickening  of the mitral valve leaflet(s). There is mild calcification of the mitral  valve leaflet(s). Mild mitral valve regurgitation. MV peak gradient, 5.5  mmHg. The mean mitral valve  gradient is 2.0 mmHg.   Tricuspid Valve: The tricuspid valve is  normal in structure. Tricuspid  valve regurgitation is mild.   Aortic Valve: The aortic valve is tricuspid. There is mild calcification  of the aortic valve. There is mild thickening of the aortic valve. Aortic  valve regurgitation is not visualized. Aortic valve  sclerosis/calcification is present, without any  evidence of aortic stenosis.   Pulmonic Valve: The pulmonic valve was normal in structure. Pulmonic valve  regurgitation is trivial.   Aorta: The aortic root is normal in size and structure.   Venous: The inferior vena cava is normal in size with greater than 50%  respiratory variability, suggesting right atrial pressure of 3 mmHg.   IAS/Shunts: The atrial septum is grossly normal.   Echo 03/30/21 IMPRESSIONS     1. Left ventricular ejection fraction, by estimation, is 55 to 60%. The  left ventricle has normal function. The left ventricle has no regional  wall motion abnormalities. Left ventricular diastolic function could not  be evaluated.   2. Right ventricular systolic function is normal. The right  ventricular  size is normal. There is moderately elevated pulmonary artery systolic  pressure. The estimated right ventricular systolic pressure is 48.4 mmHg.   3. Left atrial size was severely dilated.   4. The mitral valve is grossly normal. Mild to moderate mitral valve  regurgitation.   5. Tricuspid valve regurgitation is mild to moderate.   6. The aortic valve is tricuspid. Aortic valve regurgitation is not  visualized. Aortic valve sclerosis/calcification is present, without any  evidence of aortic stenosis.   Cardiac cath 04/12/20  Prox LAD to Mid LAD lesion is 50% stenosed. 2nd Diag lesion is 75% stenosed. Ramus lesion is 50% stenosed.    Laboratory Data:  High Sensitivity Troponin:   Recent Labs  Lab 07/23/22 1725 07/23/22 1925  TROPONINIHS 13 15     Chemistry Recent Labs  Lab 07/23/22 1725  NA 137  K 3.8  CL 100  CO2 25  GLUCOSE 163*  BUN 21  CREATININE 1.24*  CALCIUM 9.3  GFRNONAA 40*  ANIONGAP 12    No results for input(s): "PROT", "ALBUMIN", "AST", "ALT", "ALKPHOS", "BILITOT" in the last 168 hours. Lipids No results for input(s): "CHOL", "TRIG", "HDL", "LABVLDL", "LDLCALC", "CHOLHDL" in the last 168 hours.  Hematology Recent Labs  Lab 07/23/22 1725  WBC 7.8  RBC 3.53*  HGB 12.1  HCT 33.9*  MCV 96.0  MCH 34.3*  MCHC 35.7  RDW 11.7  PLT 205   Thyroid No results for input(s): "TSH", "FREET4" in the last 168 hours.  BNP Recent Labs  Lab 07/23/22 1925  BNP 191.7*    DDimer  Recent Labs  Lab 07/23/22 2138  DDIMER <0.27     Radiology/Studies:  ECHOCARDIOGRAM COMPLETE  Result Date: 07/24/2022    ECHOCARDIOGRAM REPORT   Patient Name:   ANJALI MANZELLA Date of Exam: 07/24/2022 Medical Rec #:  [id number]        Height:       66.0 in Accession #:    [id number]       Weight:       144.5 lb Date of Birth:  01/25/1927        BSA:          1.742 m Patient Age:    89 years         BP:           172/65 mmHg Patient Gender: F  HR:            55 bpm. Exam Location:  Inpatient Procedure: 2D Echo, Cardiac Doppler and Color Doppler Indications:    Chest pain  History:        Patient has prior history of Echocardiogram examinations, most                 recent 03/30/2021. CHF, CAD, Arrythmias:Atrial Fibrillation,                 Signs/Symptoms:Shortness of Breath and Chest Pain; Risk                 Factors:Hypertension, Dyslipidemia and Diabetes.  Sonographer:    Milda Smart Referring Phys: [phone] DEBBY CROSLEY  Sonographer Comments: Image acquisition challenging due to respiratory motion. IMPRESSIONS  1. Left ventricular ejection fraction, by estimation, is 60 to 65%. The left ventricle has normal function. The left ventricle has no regional wall motion abnormalities. There is moderate basal septal hypertrophy. The rest of the LV segments demonstrate  mild concentric left ventricular hypertrophy of the basal-septal segment. Left ventricular diastolic parameters are consistent with Grade II diastolic dysfunction (pseudonormalization).  2. Right ventricular systolic function is normal. The right ventricular size is normal.  3. Left atrial size was moderately dilated.  4. The mitral valve is grossly normal. Mild mitral valve regurgitation.  5. The aortic valve is tricuspid. There is mild calcification of the aortic valve. There is mild thickening of the aortic valve. Aortic valve regurgitation is not visualized. Aortic valve sclerosis/calcification is present, without any evidence of aortic stenosis.  6. The inferior vena cava is normal in size with greater than 50% respiratory variability, suggesting right atrial pressure of 3 mmHg. Comparison(s): No significant change from prior study. FINDINGS  Left Ventricle: Left ventricular ejection fraction, by estimation, is 60 to 65%. The left ventricle has normal function. The left ventricle has no regional wall motion abnormalities. The left ventricular internal cavity size was normal in size. There is   moderate basal septal hypertrophy. The rest of the LV segments demonstrate mild concentric left ventricular hypertrophy of the basal-septal segment. Left ventricular diastolic parameters are consistent with Grade II diastolic dysfunction (pseudonormalization). Right Ventricle: The right ventricular size is normal. No increase in right ventricular wall thickness. Right ventricular systolic function is normal. Left Atrium: Left atrial size was moderately dilated. Right Atrium: Right atrial size was normal in size. Pericardium: There is no evidence of pericardial effusion. Mitral Valve: The mitral valve is grossly normal. There is mild thickening of the mitral valve leaflet(s). There is mild calcification of the mitral valve leaflet(s). Mild mitral valve regurgitation. MV peak gradient, 5.5 mmHg. The mean mitral valve gradient is 2.0 mmHg. Tricuspid Valve: The tricuspid valve is normal in structure. Tricuspid valve regurgitation is mild. Aortic Valve: The aortic valve is tricuspid. There is mild calcification of the aortic valve. There is mild thickening of the aortic valve. Aortic valve regurgitation is not visualized. Aortic valve sclerosis/calcification is present, without any evidence of aortic stenosis. Pulmonic Valve: The pulmonic valve was normal in structure. Pulmonic valve regurgitation is trivial. Aorta: The aortic root is normal in size and structure. Venous: The inferior vena cava is normal in size with greater than 50% respiratory variability, suggesting right atrial pressure of 3 mmHg. IAS/Shunts: The atrial septum is grossly normal.  LEFT VENTRICLE PLAX 2D LVIDd:         4.10 cm     Diastology LVIDs:  2.70 cm     LV e' medial:    2.68 cm/s LV PW:         1.20 cm     LV E/e' medial:  25.6 LV IVS:        1.70 cm     LV e' lateral:   10.00 cm/s LVOT diam:     2.10 cm     LV E/e' lateral: 6.9 LV SV:         91 LV SV Index:   52 LVOT Area:     3.46 cm  LV Volumes (MOD) LV vol d, MOD A2C: 80.7 ml LV  vol d, MOD A4C: 86.0 ml LV vol s, MOD A2C: 25.2 ml LV vol s, MOD A4C: 29.0 ml LV SV MOD A2C:     55.5 ml LV SV MOD A4C:     86.0 ml LV SV MOD BP:      56.1 ml RIGHT VENTRICLE RV Basal diam:  3.30 cm RV S prime:     9.99 cm/s TAPSE (M-mode): 1.8 cm LEFT ATRIUM             Index        RIGHT ATRIUM           Index LA diam:        5.40 cm 3.10 cm/m   RA Area:     12.80 cm LA Vol (A2C):   77.1 ml 44.26 ml/m  RA Volume:   27.10 ml  15.56 ml/m LA Vol (A4C):   73.8 ml 42.37 ml/m LA Biplane Vol: 76.0 ml 43.63 ml/m  AORTIC VALVE LVOT Vmax:   98.80 cm/s LVOT Vmean:  76.100 cm/s LVOT VTI:    0.263 m  AORTA Ao Root diam: 2.90 cm Ao Asc diam:  3.30 cm MITRAL VALVE               TRICUSPID VALVE MV Area (PHT): 1.54 cm    TR Peak grad:   23.2 mmHg MV Area VTI:   2.37 cm    TR Vmax:        241.00 cm/s MV Peak grad:  5.5 mmHg MV Mean grad:  2.0 mmHg    SHUNTS MV Vmax:       1.17 m/s    Systemic VTI:  0.26 m MV Vmean:      62.6 cm/s   Systemic Diam: 2.10 cm MV Decel Time: 493 msec MR Peak grad: 93.7 mmHg MR Vmax:      484.00 cm/s MV E velocity: 68.60 cm/s MV A velocity: 48.00 cm/s MV E/A ratio:  1.43 Laurance Flatten MD Electronically signed by Laurance Flatten MD Signature Date/Time: 07/24/2022/10:50:04 AM    Final    DG Chest Port 1 View  Result Date: 07/23/2022 CLINICAL DATA:  Chest pressure EXAM: PORTABLE CHEST 1 VIEW COMPARISON:  03/19/2022 FINDINGS: Normal cardiac and mediastinal contours. Aortic atherosclerosis. No focal pulmonary opacity. No pleural effusion or pneumothorax. No acute osseous abnormality. Status post right reverse shoulder arthroplasty. A advanced degenerative changes in the left shoulder. Surgical clips overlie the superior mediastinum. IMPRESSION: No active disease. Electronically Signed   By: Wiliam Ke M.D.   On: 07/23/2022 17:49     Assessment and Plan:   Chest pain and dyspnea with normal troponin and known non obstructive CAD with 75% diag 2 stenosis.  50% LAD and ramus.  No acute  EKG changes.  Echo stable.  BP was uncontrolled and may have caused chest pain and dyspnea.  MD to see to further eval. She is on Ranexa at home  could consider cardiac cath but would hold off and control BP  HTN  uncontrolled and meds had not changed.  BP still elevated.  On home amlodipine 10, coreg 12.5 BID, clonidine 0.1  2 tabs every AM and one at hs.   Lasix 40 am and half in pm  benicar 40  - resume Benicar and add hydralazine if needed. Perhaps stop clonidine, has not rec'd today Increase of chronic HFpEF with HTN rec'd lasix 80 yesterday and 20 IV today.  She is neg 1410 wt down 2 Kg.  Persistent a fib/flutter though now appears to be SR she is on eliquis, continue.  Sinus brady will decrease coreg to 6.25 BID for now  DM2 per IM  HLD on statin. continue   Risk Assessment/Risk Scores:     TIMI Risk Score for Unstable Angina or Non-ST Elevation MI:   The patient's TIMI risk score is 5, which indicates a 26% risk of all cause mortality, new or recurrent myocardial infarction or need for urgent revascularization in the next 14 days.  New York Heart Association (NYHA) Functional Class NYHA Class III  CHA2DS2-VASc Score = 6   This indicates a 9.7% annual risk of stroke. The patient's score is based upon: CHF History: 1 HTN History: 1 Diabetes History: 1 Stroke History: 0 Vascular Disease History: 0 Age Score: 2 Gender Score: 1         For questions or updates, please contact [facility] Please consult [email or web address] for contact info under    Signed, Nada Boozer, NP  07/24/2022 11:19 AM

## 2022-07-24 NOTE — Plan of Care (Signed)
  Problem: Activity: Goal: Risk for activity intolerance will decrease Outcome: Progressing   Problem: Coping: Goal: Level of anxiety will decrease Outcome: Progressing   Problem: Safety: Goal: Ability to remain free from injury will improve Outcome: Progressing   

## 2022-07-24 NOTE — Evaluation (Signed)
Physical Therapy Evaluation Patient Details Name: Allison Duffy MRN: [id number] DOB: [birth year] Today's Date: 07/24/2022  History of Present Illness  Pt is 89 year old presented to [facility] on  6/26 for chest pain and SOB. Pt with acute on chronic heart failure. PMH - CAD, HTN, HLD, chronic diastolic and systolic heart failure, afib  Clinical Impression  Pt typically is independent at home including driving and currently is requiring assist for basic mobility due to weakness, decr balance, and decr functional activity tolerance. Pt lives alone and will need to be independent with basic mobility prior to returning. Pt motivated to work toward independence. Patient will benefit from continued inpatient follow up therapy, <3 hours/day        Recommendations for follow up therapy are one component of a multi-disciplinary discharge planning process, led by the attending physician.  Recommendations may be updated based on patient status, additional functional criteria and insurance authorization.  Follow Up Recommendations Can patient physically be transported by private vehicle: Yes     Assistance Recommended at Discharge Frequent or constant Supervision/Assistance  Patient can return home with the following  A little help with walking and/or transfers;A little help with bathing/dressing/bathroom;Assistance with cooking/housework    Equipment Recommendations None recommended by PT  Recommendations for Other Services       Functional Status Assessment Patient has had a recent decline in their functional status and demonstrates the ability to make significant improvements in function in a reasonable and predictable amount of time.     Precautions / Restrictions Precautions Precautions: Fall Restrictions Weight Bearing Restrictions: No      Mobility  Bed Mobility Overal bed mobility: Needs Assistance Bed Mobility: Supine to Sit     Supine to sit: Min assist, HOB elevated      General bed mobility comments: Assist to elevate trunk into sitting    Transfers Overall transfer level: Needs assistance Equipment used: Rollator (4 wheels), Straight cane Transfers: Sit to/from Stand, Bed to chair/wheelchair/BSC Sit to Stand: Min assist   Step pivot transfers: Min assist       General transfer comment: Assist to power up and for stability. Used cane for bed to recliner with poor balance requiring assist to correct. Short shuffling steps. Used rollator for subsequent stand    Ambulation/Gait Ambulation/Gait assistance: Editor, commissioning (Feet): 25 Feet Assistive device: Rollator (4 wheels) Gait Pattern/deviations: Step-through pattern, Decreased stride length Gait velocity: decr Gait velocity interpretation: <1.8 ft/sec, indicate of risk for recurrent falls   General Gait Details: Unsteady gait with assist to prevent/correct loss of balance  Stairs            Wheelchair Mobility    Modified Rankin (Stroke Patients Only)       Balance Overall balance assessment: Needs assistance Sitting-balance support: No upper extremity supported, Feet supported Sitting balance-Leahy Scale: Fair     Standing balance support: Single extremity supported, Bilateral upper extremity supported Standing balance-Leahy Scale: Poor Standing balance comment: UE support and min assist for static standing                             Pertinent Vitals/Pain Pain Assessment Pain Assessment: No/denies pain    Home Living Family/patient expects to be discharged to:: Private residence Living Arrangements: Alone Available Help at Discharge: Family;Available PRN/intermittently Type of Home: House Home Access: Stairs to enter Entrance Stairs-Rails: Right Entrance Stairs-Number of Steps: 3   Home Layout: One level  Home Equipment: Rollator (4 wheels);Cane - single point;Tub bench;Grab bars - tub/shower (hurry cane)      Prior Function Prior Level of  Function : Independent/Modified Independent;Driving             Mobility Comments: Uses hurrycane. Active       Hand Dominance   Dominant Hand: Right    Extremity/Trunk Assessment   Upper Extremity Assessment Upper Extremity Assessment: Defer to OT evaluation    Lower Extremity Assessment Lower Extremity Assessment: Generalized weakness       Communication   Communication: HOH  Cognition Arousal/Alertness: Awake/alert Behavior During Therapy: WFL for tasks assessed/performed Overall Cognitive Status: Within Functional Limits for tasks assessed                                          General Comments General comments (skin integrity, edema, etc.): Pt with elevated BP throughout 170-190's/70-90's    Exercises     Assessment/Plan    PT Assessment Patient needs continued PT services  PT Problem List Decreased strength;Decreased activity tolerance;Decreased balance;Decreased mobility       PT Treatment Interventions DME instruction;Gait training;Functional mobility training;Stair training;Therapeutic activities;Therapeutic exercise;Balance training;Patient/family education    PT Goals (Current goals can be found in the Care Plan section)  Acute Rehab PT Goals Patient Stated Goal: return home PT Goal Formulation: With patient/family Time For Goal Achievement: 08/07/22 Potential to Achieve Goals: Good    Frequency Min 1X/week     Co-evaluation               AM-PAC PT "6 Clicks" Mobility  Outcome Measure Help needed turning from your back to your side while in a flat bed without using bedrails?: A Little Help needed moving from lying on your back to sitting on the side of a flat bed without using bedrails?: A Little Help needed moving to and from a bed to a chair (including a wheelchair)?: A Little Help needed standing up from a chair using your arms (e.g., wheelchair or bedside chair)?: A Little Help needed to walk in hospital  room?: A Little Help needed climbing 3-5 steps with a railing? : A Lot 6 Click Score: 17    End of Session Equipment Utilized During Treatment: Gait belt Activity Tolerance: Patient limited by fatigue Patient left: in chair;with call bell/phone within reach;with chair alarm set;with family/visitor present   PT Visit Diagnosis: Other abnormalities of gait and mobility (R26.89);Unsteadiness on feet (R26.81);Muscle weakness (generalized) (M62.81)    Time: 5784-6962 PT Time Calculation (min) (ACUTE ONLY): 52 min   Charges:   PT Evaluation $PT Eval Moderate Complexity: 1 Mod PT Treatments $Gait Training: 8-22 mins $Therapeutic Activity: 8-22 mins        [facility] PT Acute Rehabilitation Services Office [phone]   Angelina Ok [facility] 07/24/2022, 11:42 AM

## 2022-07-24 NOTE — Progress Notes (Signed)
Mobility Specialist Progress Note:   07/24/22 1500  Mobility  Activity Ambulated with assistance in hallway  Level of Assistance Contact guard assist, steadying assist  Assistive Device Front wheel walker  Distance Ambulated (ft) 45 ft  Activity Response Tolerated fair  Mobility Referral Yes  $Mobility charge 1 Mobility  Mobility Specialist Start Time (ACUTE ONLY) 1525  Mobility Specialist Stop Time (ACUTE ONLY) 1539  Mobility Specialist Time Calculation (min) (ACUTE ONLY) 14 min    Pre Mobility: 57 HR,  156/56 BP,  96% SpO2 Post Mobility:  70 HR  Pt received in chair, agreeable to mobility. Ambulated in hallway w/ RW, CG. Required chair follow. C/o mild SOB and dizziness. Otherwise asymptomatic and VSS throughout. Pt left in chair with call bell and chair alarm on.  D'Vante Earlene Plater Mobility Specialist Please contact via Special educational needs teacher or Rehab office at [phone]

## 2022-07-24 NOTE — Progress Notes (Signed)
PROGRESS NOTE    Allison Duffy  ZDG:[id number] DOB: 11/14/1926 DOA: 07/23/2022 PCP: Geoffry Paradise, MD    Brief Narrative:  89 year old female with past medical history of CAD, HTN, HLD, chronic diastolic and systolic heart failure preserved EF, and atrial fibrillation on chronic anticoagulation.  Per patient a few weeks ago she developed intermittent chest pressure and weakness.  Initially her discomfort was every now and then, she paid no attention.  Two  nights. ago she had a rough night, all night. Per patient if she takes a shower after she has to lay back on the recliner for good while to rest as she gets very short of breath.  She reports left arm pain and chest pressure.  She states chest pain and SOB is present when she does her activities at home.  She has had this discomfort before but not like this. This is much worse.  He endorses being lightheaded and dizzy. All this started a few weeks ago. She states she  is compliant w/ her meds.  Assessment and Plan:   Coronary artery disease // chest pains -Given patient's age, CAD history, shortness of breath, and chest pain is worse with activity.  Will bring patient in and observe overnight. -Troponins normal. EKG NSR w/o ischemic changes -Patient with history of CAD and CHF.  Treating CHF with IV Lasix -Patient on Ranexa 500 mg p.o. twice daily.  -Eliquis continued -Resume Norvasc, Coreg, Benicar, Crestor, and Ranexa -cards consults   Acute on chronic diastolic heart failure   -CHF order set initiated -Patient mostly euvolemic.  Patient received 80 mg IV Lasix in the ER.  Will give patient's 20 mg IV in AM.  Reassess after for further  need continued IV Lasix- per cards -Strict I's and O's -Daily weights    Essential hypertension -Presenting BP 196/121.  Elevated blood pressure likely contributing to patient's chest pains and developing CHF.  Home medication resumed -cards consult   Type 2 diabetes mellitus with  complication, without long-term current use of insulin (HCC) -Sliding scale insulin ordered  Hypomagnesemia -replete   PT eval- SNF placement  DVT prophylaxis:  apixaban (ELIQUIS) tablet 5 mg    Code Status: DNR Family Communication: at bedside  Disposition Plan:  Level of care: Telemetry Cardiac Status is: Observation The patient will require care spanning > 2 midnights and should be moved to inpatient because: cards eval and SNF placement    Consultants:  cards   Subjective: C/o neck pain, NA  Objective: Vitals:   07/24/22 0600 07/24/22 0615 07/24/22 0645 07/24/22 0719  BP: (!) 172/70  (!) 172/68 (!) 172/65  Pulse:    (!) 55  Resp:    18  Temp:    97.7 F (36.5 C)  TempSrc:    Oral  SpO2:    98%  Weight:  65.5 kg    Height:        Intake/Output Summary (Last 24 hours) at 07/24/2022 1131 Last data filed at 07/24/2022 1028 Gross per 24 hour  Intake 440 ml  Output 1850 ml  Net -1410 ml   Filed Weights   07/23/22 1657 07/24/22 0615  Weight: 67.6 kg 65.5 kg    Examination:   General: Appearance:    Well developed, well nourished female in no acute distress     Lungs:     Clear to auscultation bilaterally, respirations unlabored  Heart:    Bradycardic.    MS:   All extremities are intact.  Neurologic:   Awake, alert       Data Reviewed: I have personally reviewed following labs and imaging studies  CBC: Recent Labs  Lab 07/23/22 1725  WBC 7.8  HGB 12.1  HCT 33.9*  MCV 96.0  PLT 205   Basic Metabolic Panel: Recent Labs  Lab 07/23/22 1725 07/24/22 1046  NA 137  --   K 3.8  --   CL 100  --   CO2 25  --   GLUCOSE 163*  --   BUN 21  --   CREATININE 1.24*  --   CALCIUM 9.3  --   MG  --  1.5*   GFR: Estimated Creatinine Clearance: 26 mL/min (A) (by C-G formula based on SCr of 1.24 mg/dL (H)). Liver Function Tests: No results for input(s): "AST", "ALT", "ALKPHOS", "BILITOT", "PROT", "ALBUMIN" in the last 168 hours. No results for  input(s): "LIPASE", "AMYLASE" in the last 168 hours. No results for input(s): "AMMONIA" in the last 168 hours. Coagulation Profile: No results for input(s): "INR", "PROTIME" in the last 168 hours. Cardiac Enzymes: No results for input(s): "CKTOTAL", "CKMB", "CKMBINDEX", "TROPONINI" in the last 168 hours. BNP (last 3 results) No results for input(s): "PROBNP" in the last 8760 hours. HbA1C: No results for input(s): "HGBA1C" in the last 72 hours. CBG: Recent Labs  Lab 07/23/22 1931 07/24/22 0606 07/24/22 1106  GLUCAP 141* 180* 199*   Lipid Profile: No results for input(s): "CHOL", "HDL", "LDLCALC", "TRIG", "CHOLHDL", "LDLDIRECT" in the last 72 hours. Thyroid Function Tests: No results for input(s): "TSH", "T4TOTAL", "FREET4", "T3FREE", "THYROIDAB" in the last 72 hours. Anemia Panel: No results for input(s): "VITAMINB12", "FOLATE", "FERRITIN", "TIBC", "IRON", "RETICCTPCT" in the last 72 hours. Sepsis Labs: No results for input(s): "PROCALCITON", "LATICACIDVEN" in the last 168 hours.  Recent Results (from the past 240 hour(s))  SARS Coronavirus 2 by RT PCR (hospital order, performed in [facility] hospital lab) *cepheid single result test* Anterior Nasal Swab     Status: None   Collection Time: 07/23/22  7:05 PM   Specimen: Anterior Nasal Swab  Result Value Ref Range Status   SARS Coronavirus 2 by RT PCR NEGATIVE NEGATIVE Final    Comment: Performed at [facility], [address], [place], [place] [address]         Radiology Studies: ECHOCARDIOGRAM COMPLETE  Result Date: 07/24/2022    ECHOCARDIOGRAM REPORT   Patient Name:   Allison Duffy Date of Exam: 07/24/2022 Medical Rec #:  [id number]        Height:       66.0 in Accession #:    [id number]       Weight:       144.5 lb Date of Birth:  08-06-1926        BSA:          1.742 m Patient Age:    89 years         BP:           172/65 mmHg Patient Gender: F                HR:           55 bpm. Exam Location:  Inpatient  Procedure: 2D Echo, Cardiac Doppler and Color Doppler Indications:    Chest pain  History:        Patient has prior history of Echocardiogram examinations, most  recent 03/30/2021. CHF, CAD, Arrythmias:Atrial Fibrillation,                 Signs/Symptoms:Shortness of Breath and Chest Pain; Risk                 Factors:Hypertension, Dyslipidemia and Diabetes.  Sonographer:    Milda Smart Referring Phys: [phone] DEBBY CROSLEY  Sonographer Comments: Image acquisition challenging due to respiratory motion. IMPRESSIONS  1. Left ventricular ejection fraction, by estimation, is 60 to 65%. The left ventricle has normal function. The left ventricle has no regional wall motion abnormalities. There is moderate basal septal hypertrophy. The rest of the LV segments demonstrate  mild concentric left ventricular hypertrophy of the basal-septal segment. Left ventricular diastolic parameters are consistent with Grade II diastolic dysfunction (pseudonormalization).  2. Right ventricular systolic function is normal. The right ventricular size is normal.  3. Left atrial size was moderately dilated.  4. The mitral valve is grossly normal. Mild mitral valve regurgitation.  5. The aortic valve is tricuspid. There is mild calcification of the aortic valve. There is mild thickening of the aortic valve. Aortic valve regurgitation is not visualized. Aortic valve sclerosis/calcification is present, without any evidence of aortic stenosis.  6. The inferior vena cava is normal in size with greater than 50% respiratory variability, suggesting right atrial pressure of 3 mmHg. Comparison(s): No significant change from prior study. FINDINGS  Left Ventricle: Left ventricular ejection fraction, by estimation, is 60 to 65%. The left ventricle has normal function. The left ventricle has no regional wall motion abnormalities. The left ventricular internal cavity size was normal in size. There is  moderate basal septal hypertrophy. The rest  of the LV segments demonstrate mild concentric left ventricular hypertrophy of the basal-septal segment. Left ventricular diastolic parameters are consistent with Grade II diastolic dysfunction (pseudonormalization). Right Ventricle: The right ventricular size is normal. No increase in right ventricular wall thickness. Right ventricular systolic function is normal. Left Atrium: Left atrial size was moderately dilated. Right Atrium: Right atrial size was normal in size. Pericardium: There is no evidence of pericardial effusion. Mitral Valve: The mitral valve is grossly normal. There is mild thickening of the mitral valve leaflet(s). There is mild calcification of the mitral valve leaflet(s). Mild mitral valve regurgitation. MV peak gradient, 5.5 mmHg. The mean mitral valve gradient is 2.0 mmHg. Tricuspid Valve: The tricuspid valve is normal in structure. Tricuspid valve regurgitation is mild. Aortic Valve: The aortic valve is tricuspid. There is mild calcification of the aortic valve. There is mild thickening of the aortic valve. Aortic valve regurgitation is not visualized. Aortic valve sclerosis/calcification is present, without any evidence of aortic stenosis. Pulmonic Valve: The pulmonic valve was normal in structure. Pulmonic valve regurgitation is trivial. Aorta: The aortic root is normal in size and structure. Venous: The inferior vena cava is normal in size with greater than 50% respiratory variability, suggesting right atrial pressure of 3 mmHg. IAS/Shunts: The atrial septum is grossly normal.  LEFT VENTRICLE PLAX 2D LVIDd:         4.10 cm     Diastology LVIDs:         2.70 cm     LV e' medial:    2.68 cm/s LV PW:         1.20 cm     LV E/e' medial:  25.6 LV IVS:        1.70 cm     LV e' lateral:   10.00 cm/s LVOT diam:  2.10 cm     LV E/e' lateral: 6.9 LV SV:         91 LV SV Index:   52 LVOT Area:     3.46 cm  LV Volumes (MOD) LV vol d, MOD A2C: 80.7 ml LV vol d, MOD A4C: 86.0 ml LV vol s, MOD A2C:  25.2 ml LV vol s, MOD A4C: 29.0 ml LV SV MOD A2C:     55.5 ml LV SV MOD A4C:     86.0 ml LV SV MOD BP:      56.1 ml RIGHT VENTRICLE RV Basal diam:  3.30 cm RV S prime:     9.99 cm/s TAPSE (M-mode): 1.8 cm LEFT ATRIUM             Index        RIGHT ATRIUM           Index LA diam:        5.40 cm 3.10 cm/m   RA Area:     12.80 cm LA Vol (A2C):   77.1 ml 44.26 ml/m  RA Volume:   27.10 ml  15.56 ml/m LA Vol (A4C):   73.8 ml 42.37 ml/m LA Biplane Vol: 76.0 ml 43.63 ml/m  AORTIC VALVE LVOT Vmax:   98.80 cm/s LVOT Vmean:  76.100 cm/s LVOT VTI:    0.263 m  AORTA Ao Root diam: 2.90 cm Ao Asc diam:  3.30 cm MITRAL VALVE               TRICUSPID VALVE MV Area (PHT): 1.54 cm    TR Peak grad:   23.2 mmHg MV Area VTI:   2.37 cm    TR Vmax:        241.00 cm/s MV Peak grad:  5.5 mmHg MV Mean grad:  2.0 mmHg    SHUNTS MV Vmax:       1.17 m/s    Systemic VTI:  0.26 m MV Vmean:      62.6 cm/s   Systemic Diam: 2.10 cm MV Decel Time: 493 msec MR Peak grad: 93.7 mmHg MR Vmax:      484.00 cm/s MV E velocity: 68.60 cm/s MV A velocity: 48.00 cm/s MV E/A ratio:  1.43 Laurance Flatten MD Electronically signed by Laurance Flatten MD Signature Date/Time: 07/24/2022/10:50:04 AM    Final    DG Chest Port 1 View  Result Date: 07/23/2022 CLINICAL DATA:  Chest pressure EXAM: PORTABLE CHEST 1 VIEW COMPARISON:  03/19/2022 FINDINGS: Normal cardiac and mediastinal contours. Aortic atherosclerosis. No focal pulmonary opacity. No pleural effusion or pneumothorax. No acute osseous abnormality. Status post right reverse shoulder arthroplasty. A advanced degenerative changes in the left shoulder. Surgical clips overlie the superior mediastinum. IMPRESSION: No active disease. Electronically Signed   By: Wiliam Ke M.D.   On: 07/23/2022 17:49        Scheduled Meds:  acidophilus  1 capsule Oral QHS   amLODipine  10 mg Oral QHS   apixaban  5 mg Oral BID   carvedilol  12.5 mg Oral BID WC   furosemide  20 mg Intravenous Q12H   insulin  aspart  0-15 Units Subcutaneous TID WC   insulin aspart  0-5 Units Subcutaneous QHS   ranolazine  500 mg Oral BID   rosuvastatin  20 mg Oral Daily   sodium chloride flush  3 mL Intravenous Q12H   Continuous Infusions:  sodium chloride       LOS: 0 days    Time  spent: 45 minutes spent on chart review, discussion with nursing staff, consultants, updating family and interview/physical exam; more than 50% of that time was spent in counseling and/or coordination of care.    Joseph Art, DO [facility] Available via Epic secure chat 7am-7pm After these hours, please refer to coverage provider listed on [email or web address] 07/24/2022, 11:31 AM

## 2022-07-24 NOTE — Progress Notes (Signed)
  Echocardiogram 2D Echocardiogram has been performed.  Allison Duffy 07/24/2022, 9:01 AM

## 2022-07-25 DIAGNOSIS — I160 Hypertensive urgency: Secondary | ICD-10-CM | POA: Diagnosis not present

## 2022-07-25 DIAGNOSIS — R0789 Other chest pain: Secondary | ICD-10-CM | POA: Diagnosis not present

## 2022-07-25 DIAGNOSIS — E876 Hypokalemia: Secondary | ICD-10-CM | POA: Diagnosis not present

## 2022-07-25 LAB — BASIC METABOLIC PANEL
Anion gap: 12 (ref 5–15)
BUN: 23 mg/dL (ref 8–23)
CO2: 26 mmol/L (ref 22–32)
Calcium: 9.3 mg/dL (ref 8.9–10.3)
Chloride: 99 mmol/L (ref 98–111)
Creatinine, Ser: 1.5 mg/dL — ABNORMAL HIGH (ref 0.44–1.00)
GFR, Estimated: 32 mL/min — ABNORMAL LOW (ref >60)
Glucose, Bld: 167 mg/dL — ABNORMAL HIGH (ref 70–99)
Potassium: 3 mmol/L — ABNORMAL LOW (ref 3.5–5.1)
Sodium: 137 mmol/L (ref 135–145)

## 2022-07-25 LAB — MAGNESIUM: Magnesium: 2 mg/dL (ref 1.7–2.4)

## 2022-07-25 LAB — GLUCOSE, CAPILLARY
Glucose-Capillary: 177 mg/dL — ABNORMAL HIGH (ref 70–99)
Glucose-Capillary: 215 mg/dL — ABNORMAL HIGH (ref 70–99)
Glucose-Capillary: 183 mg/dL — ABNORMAL HIGH (ref 70–99)
Glucose-Capillary: 159 mg/dL — ABNORMAL HIGH (ref 70–99)

## 2022-07-25 LAB — HEMOGLOBIN A1C
Hgb A1c MFr Bld: 7.3 % — ABNORMAL HIGH (ref 4.8–5.6)
Mean Plasma Glucose: 163 mg/dL

## 2022-07-25 MED ORDER — SPIRONOLACTONE 25 MG PO TABS: 25.0000 mg | ORAL_TABLET | Freq: Every day | ORAL | Status: DC

## 2022-07-25 MED ORDER — POTASSIUM CHLORIDE CRYS ER 20 MEQ PO TBCR: 40.0000 meq | EXTENDED_RELEASE_TABLET | Freq: Three times a day (TID) | ORAL | Status: DC

## 2022-07-25 MED ORDER — POTASSIUM CHLORIDE CRYS ER 20 MEQ PO TBCR
40.0000 meq | EXTENDED_RELEASE_TABLET | Freq: Three times a day (TID) | ORAL | Status: AC
  Administered 2022-07-25 (×3): 40 meq via ORAL
  Filled 2022-07-25 (×3): qty 2

## 2022-07-25 NOTE — Progress Notes (Signed)
PROGRESS NOTE    Allison Duffy  NFA:[id number] DOB: 01-29-1926 DOA: 07/23/2022 PCP: Allison Paradise, MD    Brief Narrative:  89 year old female with past medical history of CAD, HTN, HLD, chronic diastolic and systolic heart failure preserved EF, and atrial fibrillation on chronic anticoagulation.  Per patient a few weeks ago she developed intermittent chest pressure and weakness.  Initially her discomfort was every now and then, she paid no attention.  Two  nights. ago she had a rough night, all night. Per patient if she takes a shower after she has to lay back on the recliner for good while to rest as she gets very short of breath.  She reports left arm pain and chest pressure.  She states chest pain and SOB is present when she does her activities at home.  She has had this discomfort before but not like this. This is much worse.  She endorses being lightheaded and dizzy. All this started a few weeks ago. She states she  is compliant w/ her meds.  Improved overall with better control of BP.  Plan for SNF in AM if Cr stable, BP controlled and orthos negative.   Assessment and Plan:   Coronary artery disease // chest pains -Given patient's age, CAD history, shortness of breath, and chest pain is worse with activity.  Will bring patient in and observe overnight. -Troponins normal. EKG NSR w/o ischemic changes -Patient with history of CAD and CHF.  Treating CHF with IV Lasix -Patient on Ranexa 500 mg p.o. twice daily.  -Eliquis continued -Resume Norvasc, Coreg, Benicar, Crestor, and Ranexa with dose adjustments -cards consults -check orthos   Acute on chronic diastolic heart failure   -CHF order set initiated -Patient mostly euvolemic.  Patient received 80 mg IV Lasix in the ER.  Will give patient's 20 mg IV in AM.  Reassess after for further  need continued IV Lasix- per cards -Strict I's and O's -Daily weights    Essential hypertension -Presenting BP 196/121.  Elevated blood  pressure likely contributing to patient's chest pains and developing CHF.  Home medication resumed -cards consulted   Type 2 diabetes mellitus with complication, without long-term current use of insulin (HCC) -Sliding scale insulin ordered  Hypomagnesemia/hypokalemia -replete  AKI From diuresis -recheck in AM  PT eval- SNF placement  DVT prophylaxis:  apixaban (ELIQUIS) tablet 5 mg    Code Status: DNR Family Communication: at bedside  Disposition Plan:  Level of care: Telemetry Cardiac Status is: inpt    Consultants:  cards   Subjective: Feels dizzy when sitting up  Objective: Vitals:   07/25/22 0104 07/25/22 0409 07/25/22 0500 07/25/22 0727  BP: (!) 175/65 (!) 149/70  132/62  Pulse:  76  65  Resp:  14  18  Temp:  98.1 F (36.7 C)  98.3 F (36.8 C)  TempSrc:  Oral  Oral  SpO2:  95%    Weight:   64.2 kg   Height:        Intake/Output Summary (Last 24 hours) at 07/25/2022 1143 Last data filed at 07/25/2022 4696 Gross per 24 hour  Intake 836 ml  Output 1250 ml  Net -414 ml   Filed Weights   07/23/22 1657 07/24/22 0615 07/25/22 0500  Weight: 67.6 kg 65.5 kg 64.2 kg    Examination:   General: Appearance:    Well developed, well nourished female in no acute distress     Lungs:      respirations unlabored  Heart:  Normal heart rate.    MS:   All extremities are intact.    Neurologic:   Awake, alert       Data Reviewed: I have personally reviewed following labs and imaging studies  CBC: Recent Labs  Lab 07/23/22 1725 07/24/22 1046  WBC 7.8 7.4  NEUTROABS  --  4.3  HGB 12.1 13.3  HCT 33.9* 36.8  MCV 96.0 94.4  PLT 205 200   Basic Metabolic Panel: Recent Labs  Lab 07/23/22 1725 07/24/22 1046 07/25/22 0057  NA 137  --  137  K 3.8  --  3.0*  CL 100  --  99  CO2 25  --  26  GLUCOSE 163*  --  167*  BUN 21  --  23  CREATININE 1.24*  --  1.50*  CALCIUM 9.3  --  9.3  MG  --  1.5*  --    GFR: Estimated Creatinine Clearance: 21.5  mL/min (A) (by C-G formula based on SCr of 1.5 mg/dL (H)). Liver Function Tests: No results for input(s): "AST", "ALT", "ALKPHOS", "BILITOT", "PROT", "ALBUMIN" in the last 168 hours. No results for input(s): "LIPASE", "AMYLASE" in the last 168 hours. No results for input(s): "AMMONIA" in the last 168 hours. Coagulation Profile: No results for input(s): "INR", "PROTIME" in the last 168 hours. Cardiac Enzymes: No results for input(s): "CKTOTAL", "CKMB", "CKMBINDEX", "TROPONINI" in the last 168 hours. BNP (last 3 results) No results for input(s): "PROBNP" in the last 8760 hours. HbA1C: Recent Labs    07/24/22 1046  HGBA1C 7.3*   CBG: Recent Labs  Lab 07/24/22 1106 07/24/22 1631 07/24/22 2200 07/25/22 0630 07/25/22 1135  GLUCAP 199* 183* 152* 177* 215*   Lipid Profile: No results for input(s): "CHOL", "HDL", "LDLCALC", "TRIG", "CHOLHDL", "LDLDIRECT" in the last 72 hours. Thyroid Function Tests: No results for input(s): "TSH", "T4TOTAL", "FREET4", "T3FREE", "THYROIDAB" in the last 72 hours. Anemia Panel: No results for input(s): "VITAMINB12", "FOLATE", "FERRITIN", "TIBC", "IRON", "RETICCTPCT" in the last 72 hours. Sepsis Labs: No results for input(s): "PROCALCITON", "LATICACIDVEN" in the last 168 hours.  Recent Results (from the past 240 hour(s))  SARS Coronavirus 2 by RT PCR (hospital order, performed in [facility] hospital lab) *cepheid single result test* Anterior Nasal Swab     Status: None   Collection Time: 07/23/22  7:05 PM   Specimen: Anterior Nasal Swab  Result Value Ref Range Status   SARS Coronavirus 2 by RT PCR NEGATIVE NEGATIVE Final    Comment: Performed at [facility], [address] [place], [place] [address]         Radiology Studies: ECHOCARDIOGRAM COMPLETE  Result Date: 07/24/2022    ECHOCARDIOGRAM REPORT   Patient Name:   Allison Duffy Date of Exam: 07/24/2022 Medical Rec #:  [id number]        Height:       66.0 in Accession #:     [id number]       Weight:       144.5 lb Date of Birth:  1926/03/22        BSA:          1.742 m Patient Age:    89 years         BP:           172/65 mmHg Patient Gender: F                HR:           55 bpm.  Exam Location:  Inpatient Procedure: 2D Echo, Cardiac Doppler and Color Doppler Indications:    Chest pain  History:        Patient has prior history of Echocardiogram examinations, most                 recent 03/30/2021. CHF, CAD, Arrythmias:Atrial Fibrillation,                 Signs/Symptoms:Shortness of Breath and Chest Pain; Risk                 Factors:Hypertension, Dyslipidemia and Diabetes.  Sonographer:    Milda Smart Referring Phys: [phone] DEBBY CROSLEY  Sonographer Comments: Image acquisition challenging due to respiratory motion. IMPRESSIONS  1. Left ventricular ejection fraction, by estimation, is 60 to 65%. The left ventricle has normal function. The left ventricle has no regional wall motion abnormalities. There is moderate basal septal hypertrophy. The rest of the LV segments demonstrate  mild concentric left ventricular hypertrophy of the basal-septal segment. Left ventricular diastolic parameters are consistent with Grade II diastolic dysfunction (pseudonormalization).  2. Right ventricular systolic function is normal. The right ventricular size is normal.  3. Left atrial size was moderately dilated.  4. The mitral valve is grossly normal. Mild mitral valve regurgitation.  5. The aortic valve is tricuspid. There is mild calcification of the aortic valve. There is mild thickening of the aortic valve. Aortic valve regurgitation is not visualized. Aortic valve sclerosis/calcification is present, without any evidence of aortic stenosis.  6. The inferior vena cava is normal in size with greater than 50% respiratory variability, suggesting right atrial pressure of 3 mmHg. Comparison(s): No significant change from prior study. FINDINGS  Left Ventricle: Left ventricular ejection fraction, by  estimation, is 60 to 65%. The left ventricle has normal function. The left ventricle has no regional wall motion abnormalities. The left ventricular internal cavity size was normal in size. There is  moderate basal septal hypertrophy. The rest of the LV segments demonstrate mild concentric left ventricular hypertrophy of the basal-septal segment. Left ventricular diastolic parameters are consistent with Grade II diastolic dysfunction (pseudonormalization). Right Ventricle: The right ventricular size is normal. No increase in right ventricular wall thickness. Right ventricular systolic function is normal. Left Atrium: Left atrial size was moderately dilated. Right Atrium: Right atrial size was normal in size. Pericardium: There is no evidence of pericardial effusion. Mitral Valve: The mitral valve is grossly normal. There is mild thickening of the mitral valve leaflet(s). There is mild calcification of the mitral valve leaflet(s). Mild mitral valve regurgitation. MV peak gradient, 5.5 mmHg. The mean mitral valve gradient is 2.0 mmHg. Tricuspid Valve: The tricuspid valve is normal in structure. Tricuspid valve regurgitation is mild. Aortic Valve: The aortic valve is tricuspid. There is mild calcification of the aortic valve. There is mild thickening of the aortic valve. Aortic valve regurgitation is not visualized. Aortic valve sclerosis/calcification is present, without any evidence of aortic stenosis. Pulmonic Valve: The pulmonic valve was normal in structure. Pulmonic valve regurgitation is trivial. Aorta: The aortic root is normal in size and structure. Venous: The inferior vena cava is normal in size with greater than 50% respiratory variability, suggesting right atrial pressure of 3 mmHg. IAS/Shunts: The atrial septum is grossly normal.  LEFT VENTRICLE PLAX 2D LVIDd:         4.10 cm     Diastology LVIDs:         2.70 cm     LV e' medial:  2.68 cm/s LV PW:         1.20 cm     LV E/e' medial:  25.6 LV IVS:         1.70 cm     LV e' lateral:   10.00 cm/s LVOT diam:     2.10 cm     LV E/e' lateral: 6.9 LV SV:         91 LV SV Index:   52 LVOT Area:     3.46 cm  LV Volumes (MOD) LV vol d, MOD A2C: 80.7 ml LV vol d, MOD A4C: 86.0 ml LV vol s, MOD A2C: 25.2 ml LV vol s, MOD A4C: 29.0 ml LV SV MOD A2C:     55.5 ml LV SV MOD A4C:     86.0 ml LV SV MOD BP:      56.1 ml RIGHT VENTRICLE RV Basal diam:  3.30 cm RV S prime:     9.99 cm/s TAPSE (M-mode): 1.8 cm LEFT ATRIUM             Index        RIGHT ATRIUM           Index LA diam:        5.40 cm 3.10 cm/m   RA Area:     12.80 cm LA Vol (A2C):   77.1 ml 44.26 ml/m  RA Volume:   27.10 ml  15.56 ml/m LA Vol (A4C):   73.8 ml 42.37 ml/m LA Biplane Vol: 76.0 ml 43.63 ml/m  AORTIC VALVE LVOT Vmax:   98.80 cm/s LVOT Vmean:  76.100 cm/s LVOT VTI:    0.263 m  AORTA Ao Root diam: 2.90 cm Ao Asc diam:  3.30 cm MITRAL VALVE               TRICUSPID VALVE MV Area (PHT): 1.54 cm    TR Peak grad:   23.2 mmHg MV Area VTI:   2.37 cm    TR Vmax:        241.00 cm/s MV Peak grad:  5.5 mmHg MV Mean grad:  2.0 mmHg    SHUNTS MV Vmax:       1.17 m/s    Systemic VTI:  0.26 m MV Vmean:      62.6 cm/s   Systemic Diam: 2.10 cm MV Decel Time: 493 msec MR Peak grad: 93.7 mmHg MR Vmax:      484.00 cm/s MV E velocity: 68.60 cm/s MV A velocity: 48.00 cm/s MV E/A ratio:  1.43 Laurance Flatten MD Electronically signed by Laurance Flatten MD Signature Date/Time: 07/24/2022/10:50:04 AM    Final    DG Chest Port 1 View  Result Date: 07/23/2022 CLINICAL DATA:  Chest pressure EXAM: PORTABLE CHEST 1 VIEW COMPARISON:  03/19/2022 FINDINGS: Normal cardiac and mediastinal contours. Aortic atherosclerosis. No focal pulmonary opacity. No pleural effusion or pneumothorax. No acute osseous abnormality. Status post right reverse shoulder arthroplasty. A advanced degenerative changes in the left shoulder. Surgical clips overlie the superior mediastinum. IMPRESSION: No active disease. Electronically Signed   By: Wiliam Ke M.D.   On: 07/23/2022 17:49        Scheduled Meds:  acidophilus  1 capsule Oral QHS   amLODipine  10 mg Oral QHS   apixaban  5 mg Oral BID   carvedilol  6.25 mg Oral BID WC   cloNIDine  0.1 mg Oral QHS   cloNIDine  0.2 mg Oral q AM   hydrALAZINE  10  mg Oral Q8H   insulin aspart  0-15 Units Subcutaneous TID WC   insulin aspart  0-5 Units Subcutaneous QHS   irbesartan  300 mg Oral Daily   potassium chloride  40 mEq Oral TID   ranolazine  500 mg Oral BID   rosuvastatin  20 mg Oral Daily   sodium chloride flush  3 mL Intravenous Q12H   Continuous Infusions:  sodium chloride       LOS: 1 day    Time spent: 45 minutes spent on chart review, discussion with nursing staff, consultants, updating family and interview/physical exam; more than 50% of that time was spent in counseling and/or coordination of care.    Joseph Art, DO [facility] Available via Epic secure chat 7am-7pm After these hours, please refer to coverage provider listed on [email or web address] 07/25/2022, 11:43 AM

## 2022-07-25 NOTE — Plan of Care (Signed)
  Problem: Clinical Measurements: Goal: Diagnostic test results will improve Outcome: Progressing   Problem: Activity: Goal: Risk for activity intolerance will decrease Outcome: Progressing   Problem: Safety: Goal: Ability to remain free from injury will improve Outcome: Progressing   

## 2022-07-25 NOTE — Progress Notes (Signed)
Mobility Specialist Progress Note:   07/25/22 1243  Orthostatic Sitting  BP- Sitting 152/63  Orthostatic Standing at 0 minutes  BP- Standing at 0 minutes 144/66  Orthostatic Standing at 3 minutes  BP- Standing at 3 minutes 165/77  Mobility  Activity Ambulated with assistance in hallway  Level of Assistance Contact guard assist, steadying assist  Assistive Device Front wheel walker  Distance Ambulated (ft) 60 ft  Activity Response Tolerated well  Mobility Referral Yes  $Mobility charge 1 Mobility  Mobility Specialist Start Time (ACUTE ONLY) 1205  Mobility Specialist Stop Time (ACUTE ONLY) 1230  Mobility Specialist Time Calculation (min) (ACUTE ONLY) 25 min    Pt received in chair, agreeable to mobility. C/o slight lightheadedness and weakness during ambulation, otherwise asymptomatic throughout. Pt rolled in room with chair. Call bell in hand, daughter present in room.     Leory Plowman  Mobility Specialist Please contact via [employer] office at [phone]

## 2022-07-25 NOTE — Plan of Care (Signed)

## 2022-07-25 NOTE — NC FL2 (Signed)
[place] MEDICAID FL2 LEVEL OF CARE FORM     IDENTIFICATION  Patient Name: Allison Duffy Birthdate: 1926-11-16 Sex: female Admission Date (Current Location): 07/23/2022  [facility] and [place] Number:  Producer, television/film/video and Address:  [facility], [address], [place], [place] [address]      Provider Number: [id number]  Attending Physician Name and Address:  Joseph Art, DO  Relative Name and Phone Number:       Current Level of Care: Hospital Recommended Level of Care: Skilled Nursing Facility Prior Approval Number:    Date Approved/Denied:   PASRR Number: [id number]  Discharge Plan: SNF    Current Diagnoses: Patient Active Problem List   Diagnosis Date Noted   Atypical chest pain 07/24/2022   SOB (shortness of breath) 07/24/2022   Acute respiratory failure with hypoxia secondary to community acquired pneumonia 03/29/2021   Acute respiratory failure with hypoxia (HCC) 03/29/2021   Diarrhea 03/29/2021   Lactic acidosis 03/29/2021   Hypokalemia 03/29/2021   Coronary artery disease 04/13/2020   Unstable angina (HCC) 04/11/2020   Mitral regurgitation 03/01/2019   Acute on chronic diastolic CHF (congestive heart failure) (HCC) 01/28/2019   Paroxysmal atrial fibrillation (HCC) 01/28/2019   Colitis, acute 05/31/2016   Primary osteoarthritis of right shoulder 12/27/2014   Pulmonary nodule 06/23/2014   Essential hypertension 07/05/2013   Hyperlipidemia associated with type 2 diabetes mellitus (HCC) 07/05/2013   Type 2 diabetes mellitus with complication, without long-term current use of insulin (HCC) 07/05/2013   Chest pain CAD 07/05/2013    Orientation RESPIRATION BLADDER Height & Weight     Self, Time, Situation, Place  Normal Incontinent, External catheter Weight: 141 lb 8.6 oz (64.2 kg) Height:  5\' 6"  (167.6 cm)  BEHAVIORAL SYMPTOMS/MOOD NEUROLOGICAL BOWEL NUTRITION STATUS      Continent Diet (See dc summary)  AMBULATORY  STATUS COMMUNICATION OF NEEDS Skin   Limited Assist Verbally Normal                       Personal Care Assistance Level of Assistance  Bathing, Feeding, Dressing Bathing Assistance: Limited assistance Feeding assistance: Independent Dressing Assistance: Limited assistance     Functional Limitations Info  Sight, Hearing, Speech Sight Info: Adequate Hearing Info: Impaired Speech Info: Adequate    SPECIAL CARE FACTORS FREQUENCY  PT (By licensed PT), OT (By licensed OT)     PT Frequency: 5xweek OT Frequency: 5xweek            Contractures Contractures Info: Not present    Additional Factors Info  Code Status, Allergies Code Status Info: DNR Allergies Info: Codeine  Hydrocodone  Prednisone  Sulfa Antibiotics           Current Medications (07/25/2022):  This is the current hospital active medication list Current Facility-Administered Medications  Medication Dose Route Frequency Provider Last Rate Last Admin   0.9 %  sodium chloride infusion  250 mL Intravenous PRN Crosley, Debby, MD       acetaminophen (TYLENOL) tablet 650 mg  650 mg Oral Q4H PRN Crosley, Debby, MD       acidophilus (RISAQUAD) capsule 1 capsule  1 capsule Oral QHS Crosley, Debby, MD   1 capsule at 07/25/22 0105   amLODipine (NORVASC) tablet 10 mg  10 mg Oral QHS Ernie Avena, MD   10 mg at 07/25/22 0105   apixaban (ELIQUIS) tablet 5 mg  5 mg Oral BID Gery Pray, MD   5  mg at 07/25/22 0956   carvedilol (COREG) tablet 6.25 mg  6.25 mg Oral BID WC Leone Brand, NP   6.25 mg at 07/25/22 1610   cloNIDine (CATAPRES) tablet 0.1 mg  0.1 mg Oral QHS Leone Brand, NP   0.1 mg at 07/25/22 0105   cloNIDine (CATAPRES) tablet 0.2 mg  0.2 mg Oral q AM Leone Brand, NP   0.2 mg at 07/25/22 9604   hydrALAZINE (APRESOLINE) injection 5 mg  5 mg Intravenous Q4H PRN Gery Pray, MD   5 mg at 07/24/22 1119   hydrALAZINE (APRESOLINE) tablet 10 mg  10 mg Oral Q8H Leone Brand, NP   10 mg at 07/25/22  0650   insulin aspart (novoLOG) injection 0-15 Units  0-15 Units Subcutaneous TID WC Gery Pray, MD   3 Units at 07/25/22 0650   insulin aspart (novoLOG) injection 0-5 Units  0-5 Units Subcutaneous QHS Crosley, Debby, MD       irbesartan (AVAPRO) tablet 300 mg  300 mg Oral Daily Leone Brand, NP   300 mg at 07/24/22 1258   ondansetron (ZOFRAN) injection 4 mg  4 mg Intravenous Q6H PRN Crosley, Debby, MD       potassium chloride SA (KLOR-CON M) CR tablet 40 mEq  40 mEq Oral TID Perlie Gold, PA-C       ranolazine (RANEXA) 12 hr tablet 500 mg  500 mg Oral BID Crosley, Debby, MD   500 mg at 07/25/22 0956   rosuvastatin (CRESTOR) tablet 20 mg  20 mg Oral Daily Crosley, Debby, MD   20 mg at 07/25/22 0956   sodium chloride flush (NS) 0.9 % injection 3 mL  3 mL Intravenous Q12H Crosley, Debby, MD   3 mL at 07/25/22 0955   sodium chloride flush (NS) 0.9 % injection 3 mL  3 mL Intravenous PRN Gery Pray, MD         Discharge Medications: Please see discharge summary for a list of discharge medications.  Relevant Imaging Results:  Relevant Lab Results:   Additional Information SSN: [id number]  Oletta Lamas, MSW, [facility]  Clinical Social Worker I

## 2022-07-25 NOTE — Evaluation (Signed)
Occupational Therapy Evaluation Patient Details Name: Allison Duffy MRN: [id number] DOB: 10/18/1926 Today's Date: 07/25/2022   History of Present Illness Pt is 89 year old presented to [facility] on  6/26 for chest pain and SOB. Pt with acute on chronic heart failure. PMH - CAD, HTN, HLD, chronic diastolic and systolic heart failure, afib   Clinical Impression   Pt presents with decline in function and safety with ADLs and ADL mobility with impaired strength, balance and endurance. PTA pt lived at home alone (daughter lives close by an grandson next door)  and was Ind with ADLs/selfcare, light cooking, mobility with a cane and drives. Pt currently requires sup with UB ADLs, min guard A with grooming/hygiene standing at sink, mod A with LB ADLs, min A with toilet transfers and min A with toileting tasks. Pt would benefit from acute OT services to address impairments to maximize level of function and safety     Recommendations for follow up therapy are one component of a multi-disciplinary discharge planning process, led by the attending physician.  Recommendations may be updated based on patient status, additional functional criteria and insurance authorization.   Assistance Recommended at Discharge Frequent or constant Supervision/Assistance  Patient can return home with the following A lot of help with bathing/dressing/bathroom;A little help with walking and/or transfers;Assistance with cooking/housework;Help with stairs or ramp for entrance;Assist for transportation    Functional Status Assessment  Patient has had a recent decline in their functional status and demonstrates the ability to make significant improvements in function in a reasonable and predictable amount of time.  Equipment Recommendations  None recommended by OT    Recommendations for Other Services       Precautions / Restrictions Precautions Precautions: Fall Restrictions Weight Bearing Restrictions: No      Mobility  Bed Mobility               General bed mobility comments: pt in recliner upon arrival    Transfers Overall transfer level: Needs assistance Equipment used: 1 person hand held assist, Rolling walker (2 wheels) Transfers: Sit to/from Stand, Bed to chair/wheelchair/BSC Sit to Stand: Min assist     Step pivot transfers: Min assist            Balance Overall balance assessment: Needs assistance Sitting-balance support: No upper extremity supported, Feet supported Sitting balance-Leahy Scale: Fair     Standing balance support: Single extremity supported, Bilateral upper extremity supported Standing balance-Leahy Scale: Poor                             ADL either performed or assessed with clinical judgement   ADL Overall ADL's : Needs assistance/impaired Eating/Feeding: Independent   Grooming: Wash/dry hands;Wash/dry face;Min guard;Standing   Upper Body Bathing: Set up;Supervision/ safety;Sitting   Lower Body Bathing: Moderate assistance   Upper Body Dressing : Set up;Supervision/safety;Sitting   Lower Body Dressing: Moderate assistance   Toilet Transfer: Minimal assistance;Ambulation;Rolling walker (2 wheels);Regular Toilet;Grab bars   Toileting- Clothing Manipulation and Hygiene: Minimal assistance;Sit to/from stand       Functional mobility during ADLs: Minimal assistance;Rolling walker (2 wheels)       Vision Baseline Vision/History: 1 Wears glasses Ability to See in Adequate Light: 0 Adequate Patient Visual Report: No change from baseline       Perception     Praxis      Pertinent Vitals/Pain Pain Assessment Pain Assessment: No/denies pain     Hand  Dominance Right   Extremity/Trunk Assessment Upper Extremity Assessment Upper Extremity Assessment: Generalized weakness   Lower Extremity Assessment Lower Extremity Assessment: Defer to PT evaluation       Communication Communication Communication: HOH   Cognition  Arousal/Alertness: Awake/alert Behavior During Therapy: WFL for tasks assessed/performed Overall Cognitive Status: Within Functional Limits for tasks assessed                                       General Comments       Exercises     Shoulder Instructions      Home Living Family/patient expects to be discharged to:: Private residence Living Arrangements: Alone Available Help at Discharge: Family;Available PRN/intermittently Type of Home: House Home Access: Stairs to enter [employer] of Steps: 3 Entrance Stairs-Rails: Right Home Layout: One level     Bathroom Shower/Tub: Chief Strategy Officer: Handicapped height     Home Equipment: Rollator (4 wheels);Cane - single point;Tub bench;Grab bars - tub/shower          Prior Functioning/Environment               Mobility Comments: Uses hurrycane. Active ADLs Comments: Ind with ADLs/selfcare, IADLs, drives, light cooking; assist for housekeeping        OT Problem List: Decreased strength;Impaired balance (sitting and/or standing);Decreased activity tolerance      OT Treatment/Interventions: Self-care/ADL training;DME and/or AE instruction;Therapeutic activities;Therapeutic exercise;Energy conservation;Patient/family education    OT Goals(Current goals can be found in the care plan section) Acute Rehab OT Goals Patient Stated Goal: go home OT Goal Formulation: With patient/family Time For Goal Achievement: 08/08/22 Potential to Achieve Goals: Good ADL Goals Pt Will Perform Grooming: with supervision;with set-up;standing Pt Will Perform Lower Body Bathing: with min assist Pt Will Perform Lower Body Dressing: with min assist Pt Will Transfer to Toilet: with min guard assist;with supervision;ambulating Pt Will Perform Toileting - Clothing Manipulation and hygiene: with min guard assist;with supervision;sit to/from stand  OT Frequency: Min 2X/week    Co-evaluation               AM-PAC OT "6 Clicks" Daily Activity     Outcome Measure Help from another person eating meals?: None Help from another person taking care of personal grooming?: A Little Help from another person toileting, which includes using toliet, bedpan, or urinal?: A Little Help from another person bathing (including washing, rinsing, drying)?: A Lot Help from another person to put on and taking off regular upper body clothing?: A Little Help from another person to put on and taking off regular lower body clothing?: A Lot 6 Click Score: 17   End of Session Equipment Utilized During Treatment: Gait belt;Rolling walker (2 wheels)  Activity Tolerance: Patient tolerated treatment well Patient left: in chair;with call bell/phone within reach;with chair alarm set  OT Visit Diagnosis: Other abnormalities of gait and mobility (R26.89);Muscle weakness (generalized) (M62.81)                Time: 7846-9629 OT Time Calculation (min): 26 min Charges:  OT General Charges $OT Visit: 1 Visit OT Evaluation $OT Eval Moderate Complexity: 1 Mod OT Treatments $Self Care/Home Management : 8-22 mins    Galen Manila 07/25/2022, 1:28 PM

## 2022-07-25 NOTE — Progress Notes (Signed)
Physical Therapy Treatment Patient Details Name: Allison Duffy MRN: [id number] DOB: 1926-04-14 Today's Date: 07/25/2022   History of Present Illness Pt is 89 year old presented to [facility] on  6/26 for chest pain and SOB. Pt with acute on chronic heart failure. PMH - CAD, HTN, HLD, chronic diastolic and systolic heart failure, afib    PT Comments    Pt making steady progress with mobility. Feels better today and able to amb further. Still unable to mobilize on her own and will benefit from continued inpatient follow up therapy, <3 hours/day    Recommendations for follow up therapy are one component of a multi-disciplinary discharge planning process, led by the attending physician.  Recommendations may be updated based on patient status, additional functional criteria and insurance authorization.  Follow Up Recommendations  Can patient physically be transported by private vehicle: Yes    Assistance Recommended at Discharge Frequent or constant Supervision/Assistance  Patient can return home with the following A little help with walking and/or transfers;A little help with bathing/dressing/bathroom;Assistance with cooking/housework   Equipment Recommendations  None recommended by PT    Recommendations for Other Services       Precautions / Restrictions Precautions Precautions: Fall Restrictions Weight Bearing Restrictions: No     Mobility  Bed Mobility               General bed mobility comments: Pt sitting EOB    Transfers Overall transfer level: Needs assistance Equipment used: Rollator (4 wheels), 1 person hand held assist Transfers: Sit to/from Stand, Bed to chair/wheelchair/BSC Sit to Stand: Min assist   Step pivot transfers: Min assist       General transfer comment: Assist to power up and for stability. Bed to bsc with hand held for balance and support    Ambulation/Gait Ambulation/Gait assistance: Min assist Gait Distance (Feet): 60 Feet (x  2) Assistive device: Rollator (4 wheels) Gait Pattern/deviations: Step-through pattern, Decreased stride length Gait velocity: decr Gait velocity interpretation: <1.8 ft/sec, indicate of risk for recurrent falls   General Gait Details: Assist for balance and support   Stairs             Wheelchair Mobility    Modified Rankin (Stroke Patients Only)       Balance Overall balance assessment: Needs assistance Sitting-balance support: No upper extremity supported, Feet supported Sitting balance-Leahy Scale: Fair     Standing balance support: Single extremity supported, Bilateral upper extremity supported Standing balance-Leahy Scale: Poor Standing balance comment: UE support and min assist for static standing                            Cognition Arousal/Alertness: Awake/alert Behavior During Therapy: WFL for tasks assessed/performed Overall Cognitive Status: Within Functional Limits for tasks assessed                                          Exercises      General Comments        Pertinent Vitals/Pain Pain Assessment Pain Assessment: No/denies pain    Home Living                          Prior Function            PT Goals (current goals can now be found in the care  plan section) Acute Rehab PT Goals Patient Stated Goal: return home Progress towards PT goals: Progressing toward goals    Frequency    Min 1X/week      PT Plan Current plan remains appropriate    Co-evaluation              AM-PAC PT "6 Clicks" Mobility   Outcome Measure  Help needed turning from your back to your side while in a flat bed without using bedrails?: A Little Help needed moving from lying on your back to sitting on the side of a flat bed without using bedrails?: A Little Help needed moving to and from a bed to a chair (including a wheelchair)?: A Little Help needed standing up from a chair using your arms (e.g., wheelchair  or bedside chair)?: A Little Help needed to walk in hospital room?: A Little Help needed climbing 3-5 steps with a railing? : A Lot 6 Click Score: 17    End of Session Equipment Utilized During Treatment: Gait belt Activity Tolerance: Patient limited by fatigue Patient left: in chair;with call bell/phone within reach;with chair alarm set;with family/visitor present   PT Visit Diagnosis: Other abnormalities of gait and mobility (R26.89);Unsteadiness on feet (R26.81);Muscle weakness (generalized) (M62.81)     Time: 9562-1308 PT Time Calculation (min) (ACUTE ONLY): 21 min  Charges:  $Gait Training: 8-22 mins                     [facility] PT Acute Rehabilitation Services Office [phone]    [facility] 07/25/2022, 12:13 PM

## 2022-07-25 NOTE — Progress Notes (Signed)
Heart Failure Navigator Progress Note  Assessed for Heart & Vascular TOC clinic readiness.  Patient does not meet criteria due to EF 60-65%, has a scheduled [facility] appointment on 08/04/2022. .   Navigator will sign off at this time.   Rhae Hammock, BSN, Scientist, clinical (histocompatibility and immunogenetics) Only

## 2022-07-25 NOTE — TOC Initial Note (Addendum)
Transition of Care [facility]) - Initial/Assessment Note    Patient Details  Name: Allison Duffy MRN: [id number] Date of Birth: [birth year]  [facility]) CM/SW Contact:    Leander Rams, [name] Phone Number: 07/25/2022, 10:33 AM  Clinical Narrative:                 CSW met with pt at bedside alongside daughter present. Pt states she is from home alone but does have support. Pt states that she has previously gone to [facility] [employer]. Pt is agreeable to go to STR and highly request [facility].   CSW will complete fl2 and fax out. TOC will continue to follow.   12:30PM Pt received offer from [facility]. SNF can admit pt over the weekend. CSW informed MD of bed offer. Anticipating dc to [facility] tomorrow morning.   Expected Discharge Plan: Skilled Nursing Facility Barriers to Discharge: Continued Medical Work up   Patient Goals and CMS Choice Patient states their goals for this hospitalization and ongoing recovery are:: Go to [facility] [place] for [facility]          Expected Discharge Plan and Services       Living arrangements for the past 2 months: Single Family Home                                      Prior Living Arrangements/Services Living arrangements for the past 2 months: Single Family Home Lives with:: Self Patient language and need for interpreter reviewed:: No Do you feel safe going back to the place where you live?: Yes      Need for Family Participation in Patient Care: Yes (Comment) Care giver support system in place?: Yes (comment) Current home services: DME Criminal Activity/Legal Involvement Pertinent to Current Situation/Hospitalization: No - Comment as needed  Activities of Daily Living Home Assistive Devices/Equipment: Cane (specify quad or straight) ADL Screening (condition at time of admission) Patient's cognitive ability adequate to safely complete daily activities?: Yes Is the patient deaf or have difficulty hearing?:  Yes Does the patient have difficulty seeing, even when wearing glasses/contacts?: No Does the patient have difficulty concentrating, remembering, or making decisions?: No Patient able to express need for assistance with ADLs?: Yes Does the patient have difficulty dressing or bathing?: Yes Independently performs ADLs?: Yes (appropriate for developmental age) Does the patient have difficulty walking or climbing stairs?: Yes Weakness of Legs: Both Weakness of Arms/Hands: None  Permission Sought/Granted      Share Information with NAME: [name]     Permission granted to share info w Relationship: Daughter  Permission granted to share info w Contact Information: [phone]  Emotional Assessment Appearance:: Appears younger than stated age, Developmentally appropriate, Well-Groomed Attitude/Demeanor/Rapport: Charismatic, Gracious Affect (typically observed): Stable, Accepting Orientation: : Oriented to Self, Oriented to Place, Oriented to  Time, Oriented to Situation Alcohol / Substance Use: Not Applicable Psych Involvement: No (comment)  Admission diagnosis:  Atypical chest pain [R07.89] Nonspecific chest pain [R07.9] Hypertension, unspecified type [I10] SOB (shortness of breath) [R06.02] Patient Active Problem List   Diagnosis Date Noted   Atypical chest pain 07/24/2022   SOB (shortness of breath) 07/24/2022   Acute respiratory failure with hypoxia secondary to community acquired pneumonia 03/29/2021   Acute respiratory failure with hypoxia (HCC) 03/29/2021   Diarrhea 03/29/2021   Lactic acidosis 03/29/2021   Hypokalemia 03/29/2021   Coronary artery disease  04/13/2020   Unstable angina (HCC) 04/11/2020   Mitral regurgitation 03/01/2019   Acute on chronic diastolic CHF (congestive heart failure) (HCC) 01/28/2019   Paroxysmal atrial fibrillation (HCC) 01/28/2019   Colitis, acute 05/31/2016   Primary osteoarthritis of right shoulder 12/27/2014   Pulmonary nodule 06/23/2014    Essential hypertension 07/05/2013   Hyperlipidemia associated with type 2 diabetes mellitus (HCC) 07/05/2013   Type 2 diabetes mellitus with complication, without long-term current use of insulin (HCC) 07/05/2013   Chest pain CAD 07/05/2013   PCP:  Geoffry Paradise, MD Pharmacy:   [facility] [phone] Ginette Otto, [place] - [address] AT [facility] [address] [place] [address] Phone: [phone] Fax: [phone]  [facility] [address] [place] [address] Phone: [phone] Fax: [phone]     Social Determinants of Health (SDOH) Social History: SDOH Screenings   Food Insecurity: No Food Insecurity (07/24/2022)  Housing: Patient Declined (07/24/2022)  Transportation Needs: No Transportation Needs (07/24/2022)  Utilities: Not At Risk (07/24/2022)  Depression (PHQ2-9): Low Risk  (04/30/2021)  Tobacco Use: Medium Risk (07/23/2022)   SDOH Interventions:     Readmission Risk Interventions     No data to display         Oletta Lamas, MSW, LCSWA, LCASA Transitions of Care  Clinical Social Worker I

## 2022-07-25 NOTE — Progress Notes (Signed)
Patient Name: Allison Duffy Date of Encounter: 07/25/2022 [facility] Cardiologist: Nanetta Batty, MD   Interval Summary  .    Patient reports that she feels "the best I have in a long time." She denies chest pain or shortness of breath this morning. She does report some mild lightheadedness. Discussed that this is likely a result of BP improvement.   Vital Signs .    Vitals:   07/25/22 0104 07/25/22 0409 07/25/22 0500 07/25/22 0727  BP: (!) 175/65 (!) 149/70  132/62  Pulse:  76  65  Resp:  14  18  Temp:  98.1 F (36.7 C)  98.3 F (36.8 C)  TempSrc:  Oral  Oral  SpO2:  95%    Weight:   64.2 kg   Height:        Intake/Output Summary (Last 24 hours) at 07/25/2022 0915 Last data filed at 07/25/2022 1610 Gross per 24 hour  Intake 836 ml  Output 2100 ml  Net -1264 ml      07/25/2022    5:00 AM 07/24/2022    6:15 AM 07/23/2022    4:57 PM  Last 3 Weights  Weight (lbs) 141 lb 8.6 oz 144 lb 8 oz 149 lb  Weight (kg) 64.2 kg 65.545 kg 67.586 kg      Telemetry/ECG    Sinus rhythm with first degree AVB - Personally Reviewed  Physical Exam .   GEN: No acute distress.   Neck: No JVD Cardiac: RRR, no murmurs, rubs, or gallops.  Respiratory: Clear to auscultation bilaterally. GI: Soft, nontender, non-distended  MS: No edema  Assessment & Plan .     Chest pain Hx non-obstructive CAD  Patient admitted with chest pain and hypertension. She has known non-obstructive CAD per March 2022 LHC which was completed after cardiac calcium score found to be elevated and subsequent cardiac CTA with concern for significant CAD. LHC at that time showed prox to mid LAD with 50% stenosis, 2nd diag with 75% stenosis, ramus lesion with 50% stenosis.   Despite chest pain and non-obstructive CAD hx, patient with negative HS troponin x2. ECG also reassuring without acute ischemic changes. Chest pain likely a result of uncontrolled hypertension. This morning, BP better controlled and  patient now chest pain free. Continue Coreg and Ranexa.   Hypertension  Patient sent to the ED on 6/26 with BP>226mmHg and chest pain. PTA anti-hypertensive regimen was: Olmesartan 40mg , Amlodipine 10mg , Coreg 12.5mg  BID, Clonidine 0.2mg  AM, 0.1mg  PM.   Patient now on Amlodipine 10mg , Coreg 6.25mg  BID, Clonidine 0.2mg  every day AM, and 0.1mg  every day PM, Hydralazine 10mg  Q8hr, Irbesasrtan 300mg . BP much better controlled on this regimen. Would consider starting spironolactone 25mg  if renal function stabilizes given HFpEF. Prefer this to hydralazine long term.  Will arrange outpatient cardiology follow up.   Chronic HFpEF Dyspnea  Patient initially received IV lasix on presentation to the ED but was found to have BNP only mildly elevated at 191. TTE this admission with LVEF 60-65% with grade II diastolic dysfunction. Clinically does not have evidence of volume overload.   Creatinine elevated this morning, 1.24->1.5 and K low at 3.0. Hold Lasix. Potassium x3 doses ordered. Check magnesium. Consider initiation of Spironolactone 25mg  per HFpEF GDMT guidelines with stabilized renal function.  Hx Atrial flutter/fib Bradycardia  Patient with hx of atrial fib/flutter, on eliquis has been in sinus rhythm with first degree AVB this admission.   Given creatine bump to 1.5 and patient's age >61,  will need to follow renal function closely and consider dose adjustment of Eliquis to 2.5mg  BID.   Hyperlipidemia  No recent lipid panel. Continue home dose crestor 20mg . Will check lipid panel tomorrow morning.   Lab Results  Component Value Date   CHOL 124 04/12/2020   HDL 37 (L) 04/12/2020   LDLCALC 56 04/12/2020   TRIG 87 03/29/2021   CHOLHDL 3.4 04/12/2020      For questions or updates, please contact [facility] Please consult [email or web address] for contact info under        Signed, Perlie Gold, PA-C

## 2022-07-26 ENCOUNTER — Other Ambulatory Visit: Payer: Self-pay | Admitting: Cardiovascular Disease

## 2022-07-26 DIAGNOSIS — N179 Acute kidney failure, unspecified: Secondary | ICD-10-CM

## 2022-07-26 DIAGNOSIS — I10 Essential (primary) hypertension: Secondary | ICD-10-CM | POA: Diagnosis not present

## 2022-07-26 DIAGNOSIS — R0789 Other chest pain: Secondary | ICD-10-CM | POA: Diagnosis not present

## 2022-07-26 DIAGNOSIS — Z8679 Personal history of other diseases of the circulatory system: Secondary | ICD-10-CM

## 2022-07-26 LAB — BASIC METABOLIC PANEL
Anion gap: 9 (ref 5–15)
BUN: 30 mg/dL — ABNORMAL HIGH (ref 8–23)
CO2: 25 mmol/L (ref 22–32)
Calcium: 8.9 mg/dL (ref 8.9–10.3)
Chloride: 101 mmol/L (ref 98–111)
Creatinine, Ser: 1.54 mg/dL — ABNORMAL HIGH (ref 0.44–1.00)
GFR, Estimated: 31 mL/min — ABNORMAL LOW (ref >60)
Glucose, Bld: 164 mg/dL — ABNORMAL HIGH (ref 70–99)
Potassium: 4.5 mmol/L (ref 3.5–5.1)
Sodium: 135 mmol/L (ref 135–145)

## 2022-07-26 LAB — LIPID PANEL
Cholesterol: 109 mg/dL (ref 0–200)
HDL: 57 mg/dL (ref >40)
LDL Cholesterol: 42 mg/dL (ref 0–99)
Total CHOL/HDL Ratio: 1.9 RATIO
Triglycerides: 52 mg/dL (ref <150)
VLDL: 10 mg/dL (ref 0–40)

## 2022-07-26 LAB — GLUCOSE, CAPILLARY
Glucose-Capillary: 201 mg/dL — ABNORMAL HIGH (ref 70–99)
Glucose-Capillary: 183 mg/dL — ABNORMAL HIGH (ref 70–99)
Glucose-Capillary: 186 mg/dL — ABNORMAL HIGH (ref 70–99)
Glucose-Capillary: 247 mg/dL — ABNORMAL HIGH (ref 70–99)

## 2022-07-26 MED ORDER — CARVEDILOL 3.125 MG PO TABS: 3.1250 mg | ORAL_TABLET | Freq: Two times a day (BID) | ORAL | Status: DC

## 2022-07-26 NOTE — Hospital Course (Addendum)
89 year old female with past medical history of CAD, HTN, HLD, chronic diastolic and systolic heart failure preserved EF, and atrial fibrillation on chronic anticoagulation presents with intermittent chest pain, weakness  Seen in the ED, EKG NSR troponin normal, given history of CAD and CHF admitted with cardiology consult placed on IV diuretics Patient seen by cardiology serial troponin negative x 2 EKG reassuring, advised blood pressure control-medical management. Patient had bradycardia heart rate in 30s to 40s Coreg discontinued following with heart rate stabilized.  BP meds adjusted changed to Bidil, continued on clonidine ARB.  She had elevated creatinine for which she was hydrated creatinine now downtrending She is awake decondition and being discharged to skilled nursing facility Extensively discussed to monitor weight and follow-up with cardiology

## 2022-07-26 NOTE — Progress Notes (Signed)
Notified Dr. Mikal Plane and Dr. Dayna Barker that patient's pulse rate is staying between 39-42 for past 30 min, and patient complains of feeling "lightheaded" and pressure in lower jaw. Order received to discontinue Coreg. No further orders received.

## 2022-07-26 NOTE — Progress Notes (Addendum)
Progress Note  Patient Name: Allison Duffy Date of Encounter: 07/26/2022  Primary Cardiologist: Nanetta Batty, MD  Subjective   No acute events overnight.  Chest pain resolved.  Patient complains of fatigue and she will be going to rehab upon discharge.  Inpatient Medications    Scheduled Meds:  acidophilus  1 capsule Oral QHS   amLODipine  10 mg Oral QHS   apixaban  5 mg Oral BID   carvedilol  6.25 mg Oral BID WC   cloNIDine  0.1 mg Oral QHS   cloNIDine  0.2 mg Oral q AM   hydrALAZINE  10 mg Oral Q8H   insulin aspart  0-15 Units Subcutaneous TID WC   insulin aspart  0-5 Units Subcutaneous QHS   irbesartan  300 mg Oral Daily   ranolazine  500 mg Oral BID   rosuvastatin  20 mg Oral Daily   sodium chloride flush  3 mL Intravenous Q12H   Continuous Infusions:  sodium chloride     PRN Meds: sodium chloride, acetaminophen, hydrALAZINE, ondansetron (ZOFRAN) IV, sodium chloride flush   Vital Signs    Vitals:   07/25/22 1935 07/25/22 2329 07/26/22 0510 07/26/22 0727  BP: (!) 156/67 (!) 148/61 (!) 152/60 (!) 142/58  Pulse: (!) 52 (!) 54 (!) 49 (!) 54  Resp: 18 18 18 18   Temp: 97.9 F (36.6 C) 98.1 F (36.7 C) 97.8 F (36.6 C) (!) 97.5 F (36.4 C)  TempSrc: Oral Oral Oral Oral  SpO2: 96% 96% 96% 97%  Weight:   64.8 kg   Height:        Intake/Output Summary (Last 24 hours) at 07/26/2022 0827 Last data filed at 07/26/2022 0811 Gross per 24 hour  Intake 834 ml  Output 1000 ml  Net -166 ml   Filed Weights   07/24/22 0615 07/25/22 0500 07/26/22 0510  Weight: 65.5 kg 64.2 kg 64.8 kg    Telemetry     Personally reviewed, NSR and HR controlled  ECG    Not performed today  Physical Exam   GEN: No acute distress.   Neck: No JVD. Cardiac: RRR, no murmur, rub, or gallop.  Respiratory: Nonlabored. Clear to auscultation bilaterally. GI: Soft, nontender, bowel sounds present. MS: No edema; No deformity. Neuro:  Nonfocal. Psych: Alert and oriented x 3.  Normal affect.  Labs    Chemistry Recent Labs  Lab 07/23/22 1725 07/25/22 0057 07/26/22 0054  NA 137 137 135  K 3.8 3.0* 4.5  CL 100 99 101  CO2 25 26 25   GLUCOSE 163* 167* 164*  BUN 21 23 30*  CREATININE 1.24* 1.50* 1.54*  CALCIUM 9.3 9.3 8.9  GFRNONAA 40* 32* 31*  ANIONGAP 12 12 9      Hematology Recent Labs  Lab 07/23/22 1725 07/24/22 1046  WBC 7.8 7.4  RBC 3.53* 3.90  HGB 12.1 13.3  HCT 33.9* 36.8  MCV 96.0 94.4  MCH 34.3* 34.1*  MCHC 35.7 36.1*  RDW 11.7 11.6  PLT 205 200    Cardiac Enzymes Recent Labs  Lab 07/23/22 1725 07/23/22 1925  TROPONINIHS 13 15    BNP Recent Labs  Lab 07/23/22 1925  BNP 191.7*     DDimer Recent Labs  Lab 07/23/22 2138  DDIMER <0.27     Radiology    ECHOCARDIOGRAM COMPLETE  Result Date: 07/24/2022    ECHOCARDIOGRAM REPORT   Patient Name:   Allison Duffy Date of Exam: 07/24/2022 Medical Rec #:  [id number]  Height:       66.0 in Accession #:    [id number]       Weight:       144.5 lb Date of Birth:  [birth year]        BSA:          1.742 m Patient Age:    89 years         BP:           172/65 mmHg Patient Gender: F                HR:           55 bpm. Exam Location:  Inpatient Procedure: 2D Echo, Cardiac Doppler and Color Doppler Indications:    Chest pain  History:        Patient has prior history of Echocardiogram examinations, most                 recent 03/30/2021. CHF, CAD, Arrythmias:Atrial Fibrillation,                 Signs/Symptoms:Shortness of Breath and Chest Pain; Risk                 Factors:Hypertension, Dyslipidemia and Diabetes.  Sonographer:    Milda Smart Referring Phys: [phone] DEBBY CROSLEY  Sonographer Comments: Image acquisition challenging due to respiratory motion. IMPRESSIONS  1. Left ventricular ejection fraction, by estimation, is 60 to 65%. The left ventricle has normal function. The left ventricle has no regional wall motion abnormalities. There is moderate basal septal hypertrophy. The rest  of the LV segments demonstrate  mild concentric left ventricular hypertrophy of the basal-septal segment. Left ventricular diastolic parameters are consistent with Grade II diastolic dysfunction (pseudonormalization).  2. Right ventricular systolic function is normal. The right ventricular size is normal.  3. Left atrial size was moderately dilated.  4. The mitral valve is grossly normal. Mild mitral valve regurgitation.  5. The aortic valve is tricuspid. There is mild calcification of the aortic valve. There is mild thickening of the aortic valve. Aortic valve regurgitation is not visualized. Aortic valve sclerosis/calcification is present, without any evidence of aortic stenosis.  6. The inferior vena cava is normal in size with greater than 50% respiratory variability, suggesting right atrial pressure of 3 mmHg. Comparison(s): No significant change from prior study. FINDINGS  Left Ventricle: Left ventricular ejection fraction, by estimation, is 60 to 65%. The left ventricle has normal function. The left ventricle has no regional wall motion abnormalities. The left ventricular internal cavity size was normal in size. There is  moderate basal septal hypertrophy. The rest of the LV segments demonstrate mild concentric left ventricular hypertrophy of the basal-septal segment. Left ventricular diastolic parameters are consistent with Grade II diastolic dysfunction (pseudonormalization). Right Ventricle: The right ventricular size is normal. No increase in right ventricular wall thickness. Right ventricular systolic function is normal. Left Atrium: Left atrial size was moderately dilated. Right Atrium: Right atrial size was normal in size. Pericardium: There is no evidence of pericardial effusion. Mitral Valve: The mitral valve is grossly normal. There is mild thickening of the mitral valve leaflet(s). There is mild calcification of the mitral valve leaflet(s). Mild mitral valve regurgitation. MV peak gradient, 5.5  mmHg. The mean mitral valve gradient is 2.0 mmHg. Tricuspid Valve: The tricuspid valve is normal in structure. Tricuspid valve regurgitation is mild. Aortic Valve: The aortic valve is tricuspid. There is mild calcification of the aortic valve. There is mild thickening  of the aortic valve. Aortic valve regurgitation is not visualized. Aortic valve sclerosis/calcification is present, without any evidence of aortic stenosis. Pulmonic Valve: The pulmonic valve was normal in structure. Pulmonic valve regurgitation is trivial. Aorta: The aortic root is normal in size and structure. Venous: The inferior vena cava is normal in size with greater than 50% respiratory variability, suggesting right atrial pressure of 3 mmHg. IAS/Shunts: The atrial septum is grossly normal.  LEFT VENTRICLE PLAX 2D LVIDd:         4.10 cm     Diastology LVIDs:         2.70 cm     LV e' medial:    2.68 cm/s LV PW:         1.20 cm     LV E/e' medial:  25.6 LV IVS:        1.70 cm     LV e' lateral:   10.00 cm/s LVOT diam:     2.10 cm     LV E/e' lateral: 6.9 LV SV:         91 LV SV Index:   52 LVOT Area:     3.46 cm  LV Volumes (MOD) LV vol d, MOD A2C: 80.7 ml LV vol d, MOD A4C: 86.0 ml LV vol s, MOD A2C: 25.2 ml LV vol s, MOD A4C: 29.0 ml LV SV MOD A2C:     55.5 ml LV SV MOD A4C:     86.0 ml LV SV MOD BP:      56.1 ml RIGHT VENTRICLE RV Basal diam:  3.30 cm RV S prime:     9.99 cm/s TAPSE (M-mode): 1.8 cm LEFT ATRIUM             Index        RIGHT ATRIUM           Index LA diam:        5.40 cm 3.10 cm/m   RA Area:     12.80 cm LA Vol (A2C):   77.1 ml 44.26 ml/m  RA Volume:   27.10 ml  15.56 ml/m LA Vol (A4C):   73.8 ml 42.37 ml/m LA Biplane Vol: 76.0 ml 43.63 ml/m  AORTIC VALVE LVOT Vmax:   98.80 cm/s LVOT Vmean:  76.100 cm/s LVOT VTI:    0.263 m  AORTA Ao Root diam: 2.90 cm Ao Asc diam:  3.30 cm MITRAL VALVE               TRICUSPID VALVE MV Area (PHT): 1.54 cm    TR Peak grad:   23.2 mmHg MV Area VTI:   2.37 cm    TR Vmax:        241.00  cm/s MV Peak grad:  5.5 mmHg MV Mean grad:  2.0 mmHg    SHUNTS MV Vmax:       1.17 m/s    Systemic VTI:  0.26 m MV Vmean:      62.6 cm/s   Systemic Diam: 2.10 cm MV Decel Time: 493 msec MR Peak grad: 93.7 mmHg MR Vmax:      484.00 cm/s MV E velocity: 68.60 cm/s MV A velocity: 48.00 cm/s MV E/A ratio:  1.43 Laurance Flatten MD Electronically signed by Laurance Flatten MD Signature Date/Time: 07/24/2022/10:50:04 AM    Final      Assessment & Plan    # Atypical chest pain likely secondary to poorly controlled HTN, currently resolved # Nonobstructive CAD by LHC in 2022  Addendum:  Dizziness with HR 39-40s, will stop Coreg and re-evaluate her symptoms # HTN, controlled -Continue current medications, amlodipine 10 mg once daily, hydralazine 10 mg every 8 hours, irbesartan 300 mg once daily, clonidine 0.2 mg in the a.m. and 0.1 mg in the p.m.  Blood pressure is adequately controlled with the current regimen, will continue the above medications.  Goal BP is less than 150 mmHg SBP.   # DOE likely secondary to poorly controlled HTN # AKI (serum creatinine increased from 1.2 to 1.5) -IV Lasix was administered with resultant AKI.  Will hold off on further Lasix administration.  P.o. Lasix 20 mg as needed for SOB/LE swelling upon discharge.   # History of A-fib/flutter -Continue carvedilol 6.5 mg twice daily, decrease the dose of Eliquis from 5 mg to 2.5 mg twice daily given serum creatinine more than 1.5 and age more than 80 years.    Signed, Marjo Bicker, MD  07/26/2022, 8:27 AM

## 2022-07-26 NOTE — Progress Notes (Signed)
PROGRESS NOTE Allison Duffy  ZOX:[id number] DOB: Jan 12, 1927 DOA: 07/23/2022 PCP: Geoffry Paradise, MD  Brief Narrative/Hospital Course: 89 year old female with past medical history of CAD, HTN, HLD, chronic diastolic and systolic heart failure preserved EF, and atrial fibrillation on chronic anticoagulation presents with intermittent chest pain, weakness  Seen in the ED, EKG NSR troponin normal, given history of CAD and CHF admitted with cardiology consult placed on IV diuretics Patient seen by cardiology serial troponin negative x 2 EKG reassuring, advised blood pressure control, Coreg and Ranexa, felt to be euvolemic and advised her  home CHF meds.    Subjective: Seen and examined this morning complains of being fatigued also somewhat dizzy, heart rate running in 40s   Assessment and Plan: Principal Problem:   Atypical chest pain Active Problems:   Coronary artery disease   Type 2 diabetes mellitus with complication, without long-term current use of insulin (HCC)   Essential hypertension   SOB (shortness of breath)  Atypical chest pain: Likely secondary to poorly controlled hypertension.  She had nonobstructive CAD by [facility] in 2022. This morning complains of intermittent chest discomfort/jaw pain-cardiology made aware.  Continue her Ranexa, question Imdur trial if recurrent pain.  Dizziness with bradycardia heart rate in 39-40: Cardiology has been notified Coreg due to bradycardia.  Monitoring telemetry  Hypertension: Blood pressure currently controlled, on amlodipine 10, clonidine 0.1 bedtime, 0.2 morning, hydralazine 10 every 8 hours Avapro 30 mg daily.  Goal of BP less than 150 per cardiology.  Dyspnea on exertion  likely in the setting of uncontrolled hypertension.  Optimize blood pressure meds.  History of a flutter/A-fib: On Eliquis, holding beta-blocker due to bradycardia  Acute on chronic diastolic CHF: Admitted on IV diuretics but currently held due to AKI monitor  intake output Daily weight. Cont to monitor daily I/O,weight, electrolytes and net balance as below.Keep on  salt/fluid restricted diet and monitor in tele. Net IO Since Admission: -2,390 mL [07/26/22 1113]  Filed Weights   07/24/22 0615 07/25/22 0500 07/26/22 0510  Weight: 65.5 kg 64.2 kg 64.8 kg     AKI: Creat trending up likely in the setting of uncontrolled hypertension also from diuretics, holding off on diuretics  Po Lasix PRN Recent Labs    11/07/21 1724 03/19/22 2040 07/23/22 1725 07/25/22 0057 07/26/22 0054  BUN 27* 26* 21 23 30*  CREATININE 1.30* 1.15* 1.24* 1.50* 1.54*  CO2 22 23 25 26 25     T2DM with complication w/ hyperglycemia: Keep on SSI monitor CBG Recent Labs  Lab 07/24/22 1046 07/24/22 1106 07/25/22 0630 07/25/22 1135 07/25/22 1617 07/25/22 2043 07/26/22 0604  GLUCAP  --    < > 177* 215* 183* 159* 201*  HGBA1C 7.3*  --   --   --   --   --   --    < > = values in this interval not displayed.    Hypomagnesemia: resolved  Deconditioning/debility/advanced age: Encourage PT OT, planning for skilled nursing facility, CODE STATUS DNR, consult palliative care given multiple comorbidities, advanced age, high risk of readmission  DVT prophylaxis: Eliquis Code Status:   Code Status: DNR Family Communication: plan of care discussed with patient at bedside. Patient status is:  inpatient  because of chest pain, bradycardia Level of care: Telemetry Cardiac   Dispo: The patient is from: home            Anticipated disposition: SNF Objective: Vitals last 24 hrs: Vitals:   07/26/22 0510 07/26/22 0727 07/26/22 1025 07/26/22 1030  BP: (!) 152/60 (!) 142/58 (!) 138/46 (!) 148/53  Pulse: (!) 49 (!) 54 (!) 39 (!) 42  Resp: 18 18  18   Temp: 97.8 F (36.6 C) (!) 97.5 F (36.4 C)  (!) 97.5 F (36.4 C)  TempSrc: Oral Oral  Oral  SpO2: 96% 97%  100%  Weight: 64.8 kg     Height:       Weight change: 0.619 kg  Physical Examination: General exam: alert awake,  older than stated age HEENT:Oral mucosa moist, Ear/Nose WNL grossly Respiratory system: bilaterally diminished BS, no use of accessory muscle Cardiovascular system: S1 & S2 +, bradycardia in 42 Gastrointestinal system: Abdomen soft,NT,ND, BS+ Nervous System:Alert, awake, moving extremities. Extremities: LE edema neg,distal peripheral pulses palpable.  Skin: No rashes,no icterus. MSK: Normal muscle bulk,tone, power  Medications reviewed:  Scheduled Meds:  acidophilus  1 capsule Oral QHS   amLODipine  10 mg Oral QHS   apixaban  5 mg Oral BID   cloNIDine  0.1 mg Oral QHS   cloNIDine  0.2 mg Oral q AM   hydrALAZINE  10 mg Oral Q8H   insulin aspart  0-15 Units Subcutaneous TID WC   insulin aspart  0-5 Units Subcutaneous QHS   irbesartan  300 mg Oral Daily   ranolazine  500 mg Oral BID   rosuvastatin  20 mg Oral Daily   sodium chloride flush  3 mL Intravenous Q12H   Continuous Infusions:  sodium chloride      Diet Order             Diet heart healthy/carb modified Room service appropriate? Yes; Fluid consistency: Thin  Diet effective now                  Intake/Output Summary (Last 24 hours) at 07/26/2022 1104 Last data filed at 07/26/2022 0934 Gross per 24 hour  Intake 834 ml  Output 1400 ml  Net -566 ml   Net IO Since Admission: -2,390 mL [07/26/22 1104]  Wt Readings from Last 3 Encounters:  07/26/22 64.8 kg  10/08/21 70.9 kg  05/30/21 74.4 kg    Unresulted Labs (From admission, onward)    None     Data Reviewed: I have personally reviewed following labs and imaging studies CBC: Recent Labs  Lab 07/23/22 1725 07/24/22 1046  WBC 7.8 7.4  NEUTROABS  --  4.3  HGB 12.1 13.3  HCT 33.9* 36.8  MCV 96.0 94.4  PLT 205 200   Basic Metabolic Panel: Recent Labs  Lab 07/23/22 1725 07/24/22 1046 07/25/22 0057 07/25/22 1000 07/26/22 0054  NA 137  --  137  --  135  K 3.8  --  3.0*  --  4.5  CL 100  --  99  --  101  CO2 25  --  26  --  25  GLUCOSE 163*  --   167*  --  164*  BUN 21  --  23  --  30*  CREATININE 1.24*  --  1.50*  --  1.54*  CALCIUM 9.3  --  9.3  --  8.9  MG  --  1.5*  --  2.0  --    Recent Labs    07/24/22 1046  HGBA1C 7.3*   CBG: Recent Labs  Lab 07/25/22 0630 07/25/22 1135 07/25/22 1617 07/25/22 2043 07/26/22 0604  GLUCAP 177* 215* 183* 159* 201*   Lipid Profile: Recent Labs    07/26/22 0054  CHOL 109  HDL 57  LDLCALC 42  TRIG 52  CHOLHDL 1.9   Thyroid Function Tests: No results for input(s): "TSH", "T4TOTAL", "FREET4", "T3FREE", "THYROIDAB" in the last 72 hours. Sepsis Labs: No results for input(s): "PROCALCITON", "LATICACIDVEN" in the last 168 hours.  Recent Results (from the past 240 hour(s))  SARS Coronavirus 2 by RT PCR (hospital order, performed in [facility] hospital lab) *cepheid single result test* Anterior Nasal Swab     Status: None   Collection Time: 07/23/22  7:05 PM   Specimen: Anterior Nasal Swab  Result Value Ref Range Status   SARS Coronavirus 2 by RT PCR NEGATIVE NEGATIVE Final    Comment: Performed at [facility], [address], [place], [place] [address]    Antimicrobials: Anti-infectives (From admission, onward)    None      Culture/Microbiology    Component Value Date/Time   SDES  03/19/2022 2155    BLOOD SITE NOT SPECIFIED Performed at [facility], [address] [place], [place] [address]    SPECREQUEST  03/19/2022 2155    BOTTLES DRAWN AEROBIC AND ANAEROBIC Blood Culture results may not be optimal due to an excessive volume of blood received in culture bottles Performed at [facility], [address] [place], [place] [address]    CULT  03/19/2022 2155    NO GROWTH 5 DAYS Performed at [facility], [address], [place], [place] [address]    REPTSTATUS 03/25/2022 FINAL 03/19/2022 2155     Exam of radiology Studies: No results found.   LOS: 2 days   Lanae Boast, MD [facility]  07/26/2022,  11:04 AM  All

## 2022-07-26 NOTE — Plan of Care (Signed)

## 2022-07-26 NOTE — Progress Notes (Signed)
Mobility Specialist Progress Note:   07/26/22 0900  Mobility  Activity Ambulated with assistance in room  Level of Assistance Contact guard assist, steadying assist  Assistive Device Front wheel walker  Distance Ambulated (ft) 15 ft  Activity Response Tolerated well  Mobility Referral Yes  $Mobility charge 1 Mobility  Mobility Specialist Start Time (ACUTE ONLY) 0900  Mobility Specialist Stop Time (ACUTE ONLY) 0912  Mobility Specialist Time Calculation (min) (ACUTE ONLY) 12 min   Pt eager for mobility session. NT requesting pt ambulate to chair in front of sink to wash up. Required only minG assist throughout. Pt left at sink, NT in to give bath.  Addison Lank Mobility Specialist Please contact via SecureChat or  Rehab office at [phone]

## 2022-07-27 DIAGNOSIS — Z8679 Personal history of other diseases of the circulatory system: Secondary | ICD-10-CM | POA: Diagnosis not present

## 2022-07-27 DIAGNOSIS — R42 Dizziness and giddiness: Secondary | ICD-10-CM | POA: Diagnosis not present

## 2022-07-27 DIAGNOSIS — N179 Acute kidney failure, unspecified: Secondary | ICD-10-CM | POA: Diagnosis not present

## 2022-07-27 DIAGNOSIS — R0789 Other chest pain: Secondary | ICD-10-CM | POA: Diagnosis not present

## 2022-07-27 DIAGNOSIS — R001 Bradycardia, unspecified: Secondary | ICD-10-CM

## 2022-07-27 DIAGNOSIS — Z7189 Other specified counseling: Secondary | ICD-10-CM

## 2022-07-27 DIAGNOSIS — I10 Essential (primary) hypertension: Secondary | ICD-10-CM | POA: Diagnosis not present

## 2022-07-27 DIAGNOSIS — Z515 Encounter for palliative care: Secondary | ICD-10-CM

## 2022-07-27 LAB — BASIC METABOLIC PANEL
Anion gap: 8 (ref 5–15)
BUN: 28 mg/dL — ABNORMAL HIGH (ref 8–23)
CO2: 25 mmol/L (ref 22–32)
Calcium: 8.9 mg/dL (ref 8.9–10.3)
Chloride: 103 mmol/L (ref 98–111)
Creatinine, Ser: 1.36 mg/dL — ABNORMAL HIGH (ref 0.44–1.00)
GFR, Estimated: 36 mL/min — ABNORMAL LOW (ref >60)
Glucose, Bld: 157 mg/dL — ABNORMAL HIGH (ref 70–99)
Potassium: 4.2 mmol/L (ref 3.5–5.1)
Sodium: 136 mmol/L (ref 135–145)

## 2022-07-27 LAB — CBC
HCT: 33.3 % — ABNORMAL LOW (ref 36.0–46.0)
Hemoglobin: 11.4 g/dL — ABNORMAL LOW (ref 12.0–15.0)
MCH: 33.9 pg (ref 26.0–34.0)
MCHC: 34.2 g/dL (ref 30.0–36.0)
MCV: 99.1 fL (ref 80.0–100.0)
Platelets: 170 10*3/uL (ref 150–400)
RBC: 3.36 MIL/uL — ABNORMAL LOW (ref 3.87–5.11)
RDW: 11.7 % (ref 11.5–15.5)
WBC: 6.7 10*3/uL (ref 4.0–10.5)
nRBC: 0 % (ref 0.0–0.2)

## 2022-07-27 LAB — GLUCOSE, CAPILLARY
Glucose-Capillary: 168 mg/dL — ABNORMAL HIGH (ref 70–99)
Glucose-Capillary: 260 mg/dL — ABNORMAL HIGH (ref 70–99)
Glucose-Capillary: 206 mg/dL — ABNORMAL HIGH (ref 70–99)

## 2022-07-27 MED ORDER — ISOSORB DINITRATE-HYDRALAZINE 20-37.5 MG PO TABS
1.0000 | ORAL_TABLET | Freq: Three times a day (TID) | ORAL | Status: DC
  Administered 2022-07-27 – 2022-07-30 (×10): 1 via ORAL
  Filled 2022-07-27 (×11): qty 1

## 2022-07-27 NOTE — Progress Notes (Signed)
PROGRESS NOTE Allison Duffy  ZOX:[id number] DOB: Mar 22, 1926 DOA: 07/23/2022 PCP: Geoffry Paradise, MD  Brief Narrative/Hospital Course: 89 year old female with past medical history of CAD, HTN, HLD, chronic diastolic and systolic heart failure preserved EF, and atrial fibrillation on chronic anticoagulation presents with intermittent chest pain, weakness  Seen in the ED, EKG NSR troponin normal, given history of CAD and CHF admitted with cardiology consult placed on IV diuretics Patient seen by cardiology serial troponin negative x 2 EKG reassuring, advised blood pressure control, Coreg and Ranexa, felt to be euvolemic and advised her  home CHF meds.    Subjective: Patient seen and examined this morning daughter at the bedside Status much better today no more dizziness Overnight HR better BP 170s this am On RA  Assessment and Plan: Principal Problem:   Atypical chest pain Active Problems:   Coronary artery disease   Type 2 diabetes mellitus with complication, without long-term current use of insulin (HCC)   Essential hypertension   SOB (shortness of breath)  Atypical chest pain: Likely secondary to poorly controlled hypertension.  She had nonobstructive CAD by [facility] in 2022. This morning complains of intermittent chest discomfort/jaw pain-cardiology made aware.  Continue her Ranexa, question Imdur trial if recurrent pain.  Dizziness with bradycardia heart rate in 39-40: Cardiology has been notified Coreg discontinued due to severe sinus bradycardia, continue to optimize medication monitoring telemetry for cardiology   Hypertension: Blood pressure fairly controlled Coreg discontinued due to bradycardia. on amlodipine 10, clonidine 0.1 bedtime, 0.2 morning, hydralazine 10 every 8 hours > changed to be daily, continue Avapro -monitor blood pressure if remains uncontrolled will need to uptitrate medication (? switching ARB to Entresto)   Dyspnea on exertion  likely in the setting of  uncontrolled hypertension.  Optimize blood pressure meds.  History of a flutter/A-fib: On Eliquis, holding beta-blocker due to bradycardia  Acute on chronic diastolic CHF: Admitted on IV diuretics but currently held due to AKI \\Creatinine  slightly better today GDMT with BiDil, ARB per cardiology CAD hypertension above. Cont to monitor intake output Daily weight. Cont to monitor daily I/O,weight, electrolytes and net balance as below.Keep on  salt/fluid restricted diet and monitor in tele. Net IO Since Admission: -4,356 mL [07/27/22 0929]  Filed Weights   07/25/22 0500 07/26/22 0510 07/27/22 0520  Weight: 64.2 kg 64.8 kg 64.7 kg     AKI: Creat appears slightly better after holding diuretics, monitor and optimize hydration Recent Labs    11/07/21 1724 03/19/22 2040 07/23/22 1725 07/25/22 0057 07/26/22 0054 07/27/22 0055  BUN 27* 26* 21 23 30* 28*  CREATININE 1.30* 1.15* 1.24* 1.50* 1.54* 1.36*  CO2 22 23 25 26 25 25      T2DM with complication w/ hyperglycemia: Blood sugar is controlled, keep on SSI monitor CBG Recent Labs  Lab 07/24/22 1046 07/24/22 1106 07/26/22 0604 07/26/22 1131 07/26/22 1600 07/26/22 2053 07/27/22 0522  GLUCAP  --    < > 201* 183* 186* 247* 168*  HGBA1C 7.3*  --   --   --   --   --   --    < > = values in this interval not displayed.     Hypomagnesemia: resolved  Deconditioning/debility/advanced age: Encourage PT OT, planning for skilled nursing facility, CODE STATUS DNR, consulted palliative care given multiple comorbidities, advanced age, high risk of readmission> DNR, no feeding tube, MOST form filled out today  DVT prophylaxis: Eliquis Code Status:   Code Status: DNR Family Communication: plan of care discussed with  patient at bedside. Patient status is:  inpatient  because of chest pain, bradycardia Level of care: Telemetry Cardiac   Dispo: The patient is from: home            Anticipated disposition: SNF in next 24 hours once cleared  by cardiology Objective: Vitals last 24 hrs: Vitals:   07/26/22 2030 07/26/22 2358 07/27/22 0520 07/27/22 0804  BP: (!) 150/58 (!) 147/51 (!) 164/82 (!) 176/62  Pulse: (!) 57 (!) 45 (!) 57 (!) 59  Resp: 18 17 18 18   Temp: 98.4 F (36.9 C) 97.9 F (36.6 C) 98.6 F (37 C) 98.1 F (36.7 C)  TempSrc: Oral Oral Oral Oral  SpO2: 97% 98% 97%   Weight:   64.7 kg   Height:       Weight change: -0.136 kg  Physical Examination: General exam: alert awake, pleasant, elderly  HEENT:Oral mucosa moist, Ear/Nose WNL grossly Respiratory system: Bilaterally clear BS,no use of accessory muscle Cardiovascular system: S1 & S2 +, No JVD. Gastrointestinal system: Abdomen soft,NT,ND, BS+ Nervous System: Alert, awake, moving all extremities,and following commands. Extremities: LE edema neg,distal peripheral pulses palpable and warm.  Skin: No rashes,no icterus. MSK: Normal muscle bulk,tone, power   Medications reviewed:  Scheduled Meds:  acidophilus  1 capsule Oral QHS   amLODipine  10 mg Oral QHS   apixaban  5 mg Oral BID   cloNIDine  0.1 mg Oral QHS   cloNIDine  0.2 mg Oral q AM   hydrALAZINE  10 mg Oral Q8H   insulin aspart  0-15 Units Subcutaneous TID WC   insulin aspart  0-5 Units Subcutaneous QHS   irbesartan  300 mg Oral Daily   ranolazine  500 mg Oral BID   rosuvastatin  20 mg Oral Daily   sodium chloride flush  3 mL Intravenous Q12H   Continuous Infusions:  sodium chloride      Diet Order             Diet heart healthy/carb modified Room service appropriate? Yes; Fluid consistency: Thin  Diet effective now                  Intake/Output Summary (Last 24 hours) at 07/27/2022 0929 Last data filed at 07/27/2022 0805 Gross per 24 hour  Intake 834 ml  Output 3200 ml  Net -2366 ml    Net IO Since Admission: -4,356 mL [07/27/22 0929]  Wt Readings from Last 3 Encounters:  07/27/22 64.7 kg  10/08/21 70.9 kg  05/30/21 74.4 kg    Unresulted Labs (From admission, onward)      Start     Ordered   07/27/22 0500  Basic metabolic panel  Daily,   R     Question:  Specimen collection method  Answer:  Lab=Lab collect   07/26/22 1112   07/27/22 0500  CBC  Daily,   R     Question:  Specimen collection method  Answer:  Lab=Lab collect   07/26/22 1112          Data Reviewed: I have personally reviewed following labs and imaging studies CBC: Recent Labs  Lab 07/23/22 1725 07/24/22 1046 07/27/22 0055  WBC 7.8 7.4 6.7  NEUTROABS  --  4.3  --   HGB 12.1 13.3 11.4*  HCT 33.9* 36.8 33.3*  MCV 96.0 94.4 99.1  PLT 205 200 170    Basic Metabolic Panel: Recent Labs  Lab 07/23/22 1725 07/24/22 1046 07/25/22 0057 07/25/22 1000 07/26/22 0054 07/27/22 0055  NA 137  --  137  --  135 136  K 3.8  --  3.0*  --  4.5 4.2  CL 100  --  99  --  101 103  CO2 25  --  26  --  25 25  GLUCOSE 163*  --  167*  --  164* 157*  BUN 21  --  23  --  30* 28*  CREATININE 1.24*  --  1.50*  --  1.54* 1.36*  CALCIUM 9.3  --  9.3  --  8.9 8.9  MG  --  1.5*  --  2.0  --   --     Recent Labs    07/24/22 1046  HGBA1C 7.3*    CBG: Recent Labs  Lab 07/26/22 0604 07/26/22 1131 07/26/22 1600 07/26/22 2053 07/27/22 0522  GLUCAP 201* 183* 186* 247* 168*    Lipid Profile: Recent Labs    07/26/22 0054  CHOL 109  HDL 57  LDLCALC 42  TRIG 52  CHOLHDL 1.9    Thyroid Function Tests: No results for input(s): "TSH", "T4TOTAL", "FREET4", "T3FREE", "THYROIDAB" in the last 72 hours. Sepsis Labs: No results for input(s): "PROCALCITON", "LATICACIDVEN" in the last 168 hours.  Recent Results (from the past 240 hour(s))  SARS Coronavirus 2 by RT PCR (hospital order, performed in [facility] lab) *cepheid single result test* Anterior Nasal Swab     Status: None   Collection Time: 07/23/22  7:05 PM   Specimen: Anterior Nasal Swab  Result Value Ref Range Status   SARS Coronavirus 2 by RT PCR NEGATIVE NEGATIVE Final    Comment: Performed at [facility],  [address], [place], [place] [address]    Antimicrobials: Anti-infectives (From admission, onward)    None      Culture/Microbiology    Component Value Date/Time   SDES  03/19/2022 2155    BLOOD SITE NOT SPECIFIED Performed at [facility], [address], [place], [place] [address]    SPECREQUEST  03/19/2022 2155    BOTTLES DRAWN AEROBIC AND ANAEROBIC Blood Culture results may not be optimal due to an excessive volume of blood received in culture bottles Performed at [facility], [address], [place], [place] [address]    CULT  03/19/2022 2155    NO GROWTH 5 DAYS Performed at [facility], [address] [place], [place] [address]    REPTSTATUS 03/25/2022 FINAL 03/19/2022 2155     Exam of radiology Studies: No results found.   LOS: 3 days   Lanae Boast, MD [facility]  07/27/2022, 9:29 AM  All

## 2022-07-27 NOTE — Progress Notes (Signed)
Progress Note  Patient Name: Allison Duffy Date of Encounter: 07/27/2022  Primary Cardiologist: Nanetta Batty, MD  Subjective   Patient had dizziness yesterday with HR 39 to 40 bpm.  Coreg was held, BP elevated and HR improved.  Dizziness resolved.  No other events, overall doing great.  Inpatient Medications    Scheduled Meds:  acidophilus  1 capsule Oral QHS   amLODipine  10 mg Oral QHS   apixaban  5 mg Oral BID   cloNIDine  0.1 mg Oral QHS   cloNIDine  0.2 mg Oral q AM   insulin aspart  0-15 Units Subcutaneous TID WC   insulin aspart  0-5 Units Subcutaneous QHS   irbesartan  300 mg Oral Daily   isosorbide-hydrALAZINE  1 tablet Oral TID   ranolazine  500 mg Oral BID   rosuvastatin  20 mg Oral Daily   sodium chloride flush  3 mL Intravenous Q12H   Continuous Infusions:  sodium chloride     PRN Meds: sodium chloride, acetaminophen, hydrALAZINE, ondansetron (ZOFRAN) IV, sodium chloride flush   Vital Signs    Vitals:   07/26/22 2030 07/26/22 2358 07/27/22 0520 07/27/22 0804  BP: (!) 150/58 (!) 147/51 (!) 164/82 (!) 176/62  Pulse: (!) 57 (!) 45 (!) 57 (!) 59  Resp: 18 17 18 18   Temp: 98.4 F (36.9 C) 97.9 F (36.6 C) 98.6 F (37 C) 98.1 F (36.7 C)  TempSrc: Oral Oral Oral Oral  SpO2: 97% 98% 97%   Weight:   64.7 kg   Height:        Intake/Output Summary (Last 24 hours) at 07/27/2022 0958 Last data filed at 07/27/2022 0805 Gross per 24 hour  Intake 834 ml  Output 2800 ml  Net -1966 ml   Filed Weights   07/25/22 0500 07/26/22 0510 07/27/22 0520  Weight: 64.2 kg 64.8 kg 64.7 kg    Telemetry     Personally reviewed, NSR and HR controlled  ECG    Not performed today  Physical Exam   GEN: No acute distress.   Neck: No JVD. Cardiac: RRR, no murmur, rub, or gallop.  Respiratory: Nonlabored. Clear to auscultation bilaterally. GI: Soft, nontender, bowel sounds present. MS: No edema; No deformity. Neuro:  Nonfocal. Psych: Alert and oriented x  3. Normal affect.  Labs    Chemistry Recent Labs  Lab 07/25/22 0057 07/26/22 0054 07/27/22 0055  NA 137 135 136  K 3.0* 4.5 4.2  CL 99 101 103  CO2 26 25 25   GLUCOSE 167* 164* 157*  BUN 23 30* 28*  CREATININE 1.50* 1.54* 1.36*  CALCIUM 9.3 8.9 8.9  GFRNONAA 32* 31* 36*  ANIONGAP 12 9 8      Hematology Recent Labs  Lab 07/23/22 1725 07/24/22 1046 07/27/22 0055  WBC 7.8 7.4 6.7  RBC 3.53* 3.90 3.36*  HGB 12.1 13.3 11.4*  HCT 33.9* 36.8 33.3*  MCV 96.0 94.4 99.1  MCH 34.3* 34.1* 33.9  MCHC 35.7 36.1* 34.2  RDW 11.7 11.6 11.7  PLT 205 200 170    Cardiac Enzymes Recent Labs  Lab 07/23/22 1725 07/23/22 1925  TROPONINIHS 13 15    BNP Recent Labs  Lab 07/23/22 1925  BNP 191.7*     DDimer Recent Labs  Lab 07/23/22 2138  DDIMER <0.27     Radiology    No results found.   Assessment & Plan    # Atypical chest pain likely secondary to poorly controlled HTN, currently resolved # Nonobstructive  CAD by [facility] in 2022   # HTN, controlled -Carvedilol discontinued due to dizziness from severe sinus bradycardia HR late 30s.  BP is elevated, 138-178/46-66 mm Hg. Switch hydralazine to BiDil 20-37.5 mg TID. Continue remaining antihypertensive medications, amlodipine 10 mg once daily, irbesartan 300 mg once daily, clonidine 0.2 mg in the a.m. and 0.1 mg in the p.m.  If BP is not controlled with above regimen, will need to switch irbesartan to [facility] tomorrow.  Goal BP less than 150 mmHg SBP.   # DOE likely secondary to poorly controlled HTN # AKI (serum creatinine increased from 1.2 to 1.5), improving today it is 1.36 -IV Lasix was administered with resultant AKI.  Will hold off on further Lasix administration.  P.o. Lasix 20 mg as needed for SOB/LE swelling upon discharge.   # History of A-fib/flutter -Currently not on any rate controlling agents due to severe sinus bradycardia with Coreg.  Continue Eliquis 5 mg twice daily.    Signed, Marjo Bicker, MD  07/27/2022, 9:58 AM

## 2022-07-27 NOTE — Consult Note (Signed)
Palliative Care Consult Note                                  Date: 07/27/2022   Patient Name: Allison Duffy  DOB: [birth year]  MRN: [id number]  Age / Sex: 89 y.o., female  PCP: Geoffry Paradise, MD Referring Physician: Lanae Boast, MD  Reason for Consultation: Establishing goals of care  HPI/Patient Profile: 89 y.o. female  with past medical history of CAD, HTN, HLD, HFpEF, and afib on anticoagulation admitted on 07/23/2022 with chest pain, shortness of breath, and left arm pain. Cardiology consulted. Serial troponin negative x 2.  Past Medical History:  Diagnosis Date   Acute on chronic combined systolic and diastolic CHF (congestive heart failure) (HCC) 01/28/2019   Arthritis    "scattered joints" (07/29/2013)   Chest pain    Colitis    Gout attack ?1980's   Hyperlipidemia    Hypertension    PONV (postoperative nausea and vomiting)    Type II diabetes mellitus (HCC)     Subjective:   I have reviewed medical records including EPIC notes, labs and imaging, assessed the patient and then met in the patient's room with the patient and her daughter Allison Duffy to discuss diagnosis prognosis, GOC, EOL wishes, disposition and options.  I introduced Palliative Medicine as specialized medical care for people living with serious illness. It focuses on providing relief from symptoms and stress of a serious illness. The goal is to improve quality of life for both the patient and the family.  Created space and opportunity for patient  and family to explore thoughts and feelings regarding current medical situation. Values and goals of care important to patient and family were attempted to be elicited.    Patient/Family Understanding of Illness: The patient and her daughter have a good understanding of both her chronic and acute conditions. The patient has been eating fast food more than usual and has some extra emotional stress (due to the 35th  anniversary of her husbands death on 2022-08-19). Her daughter believes these attributed to her elevated Bps. They are eager to get her BP regimen optimized before discharge.  Social History: The patient is widowed. 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 She has a large family. She enjoys sitting on her porch and reading.  Functional and Nutritional State: The patient lives independently at home. She drives occasionally and on Fridays to get her hair done. She has a good appetite and usually cooks for herself. Recently she was too fatigued to cook for herself so she would get take out.   Today's Discussion: The patient is living independently. Her goal is to get rehab and return home. We discussed documenting her wishes formally and a MOST was completed.  A discussion was had today regarding advanced directives. Concepts specific to code status, artifical feeding and hydration, continued IV antibiotics and rehospitalization was had.  The difference between a aggressive medical intervention path and a palliative comfort care path for this patient at this time was had. Confirmed her DNR status.The patient and family outlined their wishes for  the following treatment decisions:  Cardiopulmonary Resuscitation: Do Not Attempt Resuscitation (DNR/No CPR)  Medical Interventions: Limited Additional Interventions: Use medical treatment, IV fluids and cardiac monitoring as indicated, DO NOT USE intubation or mechanical ventilation. May consider use of less invasive airway support such as BiPAP or CPAP. Also provide comfort measures. Transfer to the hospital if indicated. Avoid intensive care.   Antibiotics: Antibiotics if indicated  IV Fluids: IV fluids if indicated  Feeding Tube: No feeding tube    Discussed the importance of continued conversation with family and the medical providers regarding overall plan of care and treatment options, ensuring  decisions are within the context of the patient's values and GOCs.  Questions and concerns were addressed.The family was encouraged to call with questions or concerns. PMT will continue to support holistically.  Review of Systems  Objective:   Primary Diagnoses: Present on Admission:  Coronary artery disease  Essential hypertension  Type 2 diabetes mellitus with complication, without long-term current use of insulin (HCC)  Atypical chest pain  SOB (shortness of breath)   Physical Exam Vitals reviewed.  HENT:     Head: Normocephalic and atraumatic.  Pulmonary:     Effort: Pulmonary effort is normal.  Skin:    General: Skin is warm and dry.  Neurological:     Mental Status: She is alert and oriented to person, place, and time.  Psychiatric:        Mood and Affect: Mood normal.        Behavior: Behavior normal.     Vital Signs:  BP (!) 176/62 (BP Location: Left Arm)   Pulse (!) 59   Temp 98.1 F (36.7 C) (Oral)   Resp 18   Ht 5\' 6"  (1.676 m)   Wt 64.7 kg   SpO2 97%   BMI 23.02 kg/m    Advanced Care Planning:   Existing Vynca/ACP Documentation: DNR completed 04/03/21.  Primary Decision Maker: The patient is able to make decisions.  Code Status/Advance Care Planning: DNR  Decisions/Changes to ACP: MOST form completed. Placed in patient chart. Will be scanned into Vynca  Assessment & Plan:    SUMMARY OF RECOMMENDATIONS   DNR Limited scope- Use MOST form for guidance Discharge to [facility] when medically ready Continued PMT support  Discussed with: Dr. Jonathon Bellows   Thank you for allowing [place] to participate in the care of [name] PMT will continue to support holistically.  Time Total: 75 minutes    Signed by: Sarina Ser, NP Palliative Medicine Team  Team Phone # [phone] (Nights/Weekends)  07/27/2022, 11:25 AM

## 2022-07-27 NOTE — Progress Notes (Signed)
Mobility Specialist Progress Note:   07/27/22 0926  Mobility  Activity Transferred from bed to chair  Level of Assistance Minimal assist, patient does 75% or more  Assistive Device Other (Comment) (HHA)  Distance Ambulated (ft) 5 ft  Activity Response Tolerated well  Mobility Referral Yes  $Mobility charge 1 Mobility  Mobility Specialist Start Time (ACUTE ONLY) [id number]  Mobility Specialist Stop Time (ACUTE ONLY) 0935  Mobility Specialist Time Calculation (min) (ACUTE ONLY) 9 min   Pt eager for mobility session. Requested to transfer to chair. Required minA via HHA to transfer to chair. No c/o throughout. Pt left in chair with all needs met, alarm on.  Allison Duffy Mobility Specialist Please contact via SecureChat or  Rehab office at [phone]

## 2022-07-28 ENCOUNTER — Other Ambulatory Visit (HOSPITAL_COMMUNITY): Payer: Self-pay

## 2022-07-28 DIAGNOSIS — R0789 Other chest pain: Secondary | ICD-10-CM | POA: Diagnosis not present

## 2022-07-28 DIAGNOSIS — I10 Essential (primary) hypertension: Secondary | ICD-10-CM | POA: Diagnosis not present

## 2022-07-28 LAB — CBC
HCT: 30.5 % — ABNORMAL LOW (ref 36.0–46.0)
Hemoglobin: 10.3 g/dL — ABNORMAL LOW (ref 12.0–15.0)
MCH: 33.4 pg (ref 26.0–34.0)
MCHC: 33.8 g/dL (ref 30.0–36.0)
MCV: 99 fL (ref 80.0–100.0)
Platelets: 158 10*3/uL (ref 150–400)
RBC: 3.08 MIL/uL — ABNORMAL LOW (ref 3.87–5.11)
RDW: 11.7 % (ref 11.5–15.5)
WBC: 7.9 10*3/uL (ref 4.0–10.5)
nRBC: 0 % (ref 0.0–0.2)

## 2022-07-28 LAB — BASIC METABOLIC PANEL
Anion gap: 7 (ref 5–15)
BUN: 34 mg/dL — ABNORMAL HIGH (ref 8–23)
CO2: 26 mmol/L (ref 22–32)
Calcium: 8.6 mg/dL — ABNORMAL LOW (ref 8.9–10.3)
Chloride: 102 mmol/L (ref 98–111)
Creatinine, Ser: 1.96 mg/dL — ABNORMAL HIGH (ref 0.44–1.00)
GFR, Estimated: 23 mL/min — ABNORMAL LOW (ref >60)
Glucose, Bld: 124 mg/dL — ABNORMAL HIGH (ref 70–99)
Potassium: 4.2 mmol/L (ref 3.5–5.1)
Sodium: 135 mmol/L (ref 135–145)

## 2022-07-28 LAB — GLUCOSE, CAPILLARY
Glucose-Capillary: 163 mg/dL — ABNORMAL HIGH (ref 70–99)
Glucose-Capillary: 264 mg/dL — ABNORMAL HIGH (ref 70–99)
Glucose-Capillary: 197 mg/dL — ABNORMAL HIGH (ref 70–99)
Glucose-Capillary: 202 mg/dL — ABNORMAL HIGH (ref 70–99)

## 2022-07-28 MED ORDER — SODIUM CHLORIDE 0.9 % IV SOLN: INTRAVENOUS | Status: DC

## 2022-07-28 MED ORDER — APIXABAN 2.5 MG PO TABS
2.5000 mg | ORAL_TABLET | Freq: Two times a day (BID) | ORAL | Status: DC
  Administered 2022-07-28 – 2022-07-30 (×4): 2.5 mg via ORAL
  Filled 2022-07-28 (×4): qty 1

## 2022-07-28 MED ORDER — PANTOPRAZOLE SODIUM 40 MG PO TBEC
40.0000 mg | DELAYED_RELEASE_TABLET | Freq: Every day | ORAL | Status: DC
  Administered 2022-07-28 – 2022-07-30 (×3): 40 mg via ORAL
  Filled 2022-07-28 (×3): qty 1

## 2022-07-28 MED ORDER — ADULT MULTIVITAMIN W/MINERALS CH
1.0000 | ORAL_TABLET | Freq: Every day | ORAL | Status: DC
  Administered 2022-07-28 – 2022-07-29 (×2): 1 via ORAL
  Filled 2022-07-28 (×3): qty 1

## 2022-07-28 NOTE — TOC Benefit Eligibility Note (Signed)
Pharmacy Patient Advocate Encounter  Insurance verification completed.    The patient is insured through [facility] Medicare Part D  Ran test claim for Entresto 24-26 mg and the current 30 day co-pay is $163.14 due to being Coverage Gap (donut hole).   This test claim was processed through [facility]- copay amounts may vary at other pharmacies due to pharmacy/plan contracts, or as the patient moves through the different stages of their insurance plan.    Roland Earl, CPHT Pharmacy Patient Advocate Specialist [facility] Patient Advocate Team Direct Number: [phone]  Fax: [phone]

## 2022-07-28 NOTE — Progress Notes (Signed)
PROGRESS NOTE Allison Duffy  ZOX:[id number] DOB: [birth year] DOA: 07/23/2022 PCP: Geoffry Paradise, MD  Brief Narrative/Hospital Course: 89 year old female with past medical history of CAD, HTN, HLD, chronic diastolic and systolic heart failure preserved EF, and atrial fibrillation on chronic anticoagulation presents with intermittent chest pain, weakness  Seen in the ED, EKG NSR troponin normal, given history of CAD and CHF admitted with cardiology consult placed on IV diuretics Patient seen by cardiology serial troponin negative x 2 EKG reassuring, advised blood pressure control, Coreg and Ranexa, felt to be euvolemic and advised her  home CHF meds.    Subjective: Seen examined this morning overall feeling well no new complaints  Daughter at the bedside  No chest pain dizziness Still with bradycardia IN 50s,BP  130-170s  Assessment and Plan: Principal Problem:   Atypical chest pain Active Problems:   Coronary artery disease   Type 2 diabetes mellitus with complication, without long-term current use of insulin (HCC)   Essential hypertension   SOB (shortness of breath)  Atypical chest pain: Likely secondary to poorly controlled hypertension.  She had nonobstructive CAD by LHC in 2022. This morning complains of intermittent chest discomfort/jaw pain-cardiology made aware.  Continue her Ranexa, bidil  Dizziness with bradycardia heart rate in 39-40: Coreg discontinued due to severe sinus bradycardia, heart rate improved in 50s monitoring in telemetry  Hypertension: fairly controlled-but at times high, Coreg discontinued due to bradycardia.  Continue amlodipine 10, clonidine 0.1 bedtime, 0.2 morning. Her  hydralazine changed to  BIDIL 6/30, continue Avapro-discussed with cardiology advised to continue current regimen, of note creatinine has trended up-so cannot change to Entresto  Dyspnea on exertion  likely in the setting of uncontrolled hypertension.  Optimize blood pressure  meds.  History of a flutter/A-fib: On Eliquis, holding beta-blocker due to bradycardia  Acute on chronic diastolic CHF: Admitted on IV diuretics-held due to AKI.  Currently remains euvolemic overall \\Creatinine  slightly better today GDMT with BiDil, ARB per cardiology  Cont to monitor intake output Daily weight. Cont to monitor daily I/O,weight, electrolytes and net balance as below.Keep on  salt/fluid restricted diet and monitor in tele. Net IO Since Admission: -5,726 mL [07/28/22 1056]  Filed Weights   07/26/22 0510 07/27/22 0520 07/28/22 0349  Weight: 64.8 kg 64.7 kg 64.7 kg     AKI: Creat uptrending this manage 1.9, discussed with cardiology this morning will not start Entresto, okay to continue Avapro and start gentle hydration overnight and repeat BMP in the morning encourage oral intake.   Recent Labs    11/07/21 1724 03/19/22 2040 07/23/22 1725 07/25/22 0057 07/26/22 0054 07/27/22 0055 07/28/22 0034  BUN 27* 26* 21 23 30* 28* 34*  CREATININE 1.30* 1.15* 1.24* 1.50* 1.54* 1.36* 1.96*  CO2 22 23 25 26 25 25 26      T2DM with complication w/ hyperglycemia: Blood sugar is controlled, keep on SSI monitor CBG Recent Labs  Lab 07/24/22 1046 07/24/22 1106 07/26/22 2053 07/27/22 0522 07/27/22 1611 07/27/22 2130 07/28/22 0605  GLUCAP  --    < > 247* 168* 260* 206* 163*  HGBA1C 7.3*  --   --   --   --   --   --    < > = values in this interval not displayed.     Hypomagnesemia: resolved  Deconditioning/debility/advanced age: Encourage PT OT, planning for skilled nursing facility, CODE STATUS DNR, consulted palliative care given multiple comorbidities, advanced age, high risk of readmission> DNR, no feeding tube, MOST form filled out  today  DVT prophylaxis: Eliquis Code Status:   Code Status: DNR Family Communication: plan of care discussed with patient at bedside. Patient status is:  inpatient  because of chest pain, bradycardia Level of care: Telemetry Cardiac    Dispo: The patient is from: home            Anticipated disposition: SNF tomorrow if creatinine improves Objective: Vitals last 24 hrs: Vitals:   07/28/22 0010 07/28/22 0349 07/28/22 0729 07/28/22 0807  BP: (!) 130/51 (!) 135/53 (!) 156/61 (!) 170/59  Pulse: 66 (!) 59 60   Resp: 20 20 18    Temp: 98.1 F (36.7 C) 97.7 F (36.5 C) 97.9 F (36.6 C)   TempSrc: Oral Oral Oral   SpO2: 93% 94% 97%   Weight:  64.7 kg    Height:       Weight change: 0.017 kg  Physical Examination: General exam: alert awake, oriented at baseline, pleasant, elderly  HEENT:Oral mucosa moist, Ear/Nose WNL grossly Respiratory system: Bilaterally clear BS,no use of accessory muscle Cardiovascular system: S1 & S2 +, No JVD. Gastrointestinal system: Abdomen soft,NT,ND, BS+ Nervous System: Alert, awake, moving all extremities,and following commands. Extremities: LE edema neg,distal peripheral pulses palpable and warm.  Skin: No rashes,no icterus. MSK: Normal muscle bulk,tone, power   Medications reviewed:  Scheduled Meds:  acidophilus  1 capsule Oral QHS   amLODipine  10 mg Oral QHS   apixaban  5 mg Oral BID   cloNIDine  0.1 mg Oral QHS   cloNIDine  0.2 mg Oral q AM   insulin aspart  0-15 Units Subcutaneous TID WC   insulin aspart  0-5 Units Subcutaneous QHS   irbesartan  300 mg Oral Daily   isosorbide-hydrALAZINE  1 tablet Oral TID   ranolazine  500 mg Oral BID   rosuvastatin  20 mg Oral Daily   sodium chloride flush  3 mL Intravenous Q12H   Continuous Infusions:  sodium chloride     sodium chloride      Diet Order             Diet heart healthy/carb modified Room service appropriate? Yes; Fluid consistency: Thin  Diet effective now                  Intake/Output Summary (Last 24 hours) at 07/28/2022 1056 Last data filed at 07/28/2022 0641 Gross per 24 hour  Intake 180 ml  Output 1550 ml  Net -1370 ml    Net IO Since Admission: -5,726 mL [07/28/22 1056]  Wt Readings from Last 3  Encounters:  07/28/22 64.7 kg  10/08/21 70.9 kg  05/30/21 74.4 kg    Unresulted Labs (From admission, onward)     Start     Ordered   07/27/22 0500  Basic metabolic panel  Daily,   R     Question:  Specimen collection method  Answer:  Lab=Lab collect   07/26/22 1112   07/27/22 0500  CBC  Daily,   R     Question:  Specimen collection method  Answer:  Lab=Lab collect   07/26/22 1112          Data Reviewed: I have personally reviewed following labs and imaging studies CBC: Recent Labs  Lab 07/23/22 1725 07/24/22 1046 07/27/22 0055 07/28/22 0034  WBC 7.8 7.4 6.7 7.9  NEUTROABS  --  4.3  --   --   HGB 12.1 13.3 11.4* 10.3*  HCT 33.9* 36.8 33.3* 30.5*  MCV 96.0 94.4 99.1 99.0  PLT 205 200 170 158    Basic Metabolic Panel: Recent Labs  Lab 07/23/22 1725 07/24/22 1046 07/25/22 0057 07/25/22 1000 07/26/22 0054 07/27/22 0055 07/28/22 0034  NA 137  --  137  --  135 136 135  K 3.8  --  3.0*  --  4.5 4.2 4.2  CL 100  --  99  --  101 103 102  CO2 25  --  26  --  25 25 26   GLUCOSE 163*  --  167*  --  164* 157* 124*  BUN 21  --  23  --  30* 28* 34*  CREATININE 1.24*  --  1.50*  --  1.54* 1.36* 1.96*  CALCIUM 9.3  --  9.3  --  8.9 8.9 8.6*  MG  --  1.5*  --  2.0  --   --   --     No results for input(s): "HGBA1C" in the last 72 hours.  CBG: Recent Labs  Lab 07/26/22 2053 07/27/22 0522 07/27/22 1611 07/27/22 2130 07/28/22 0605  GLUCAP 247* 168* 260* 206* 163*    Lipid Profile: Recent Labs    07/26/22 0054  CHOL 109  HDL 57  LDLCALC 42  TRIG 52  CHOLHDL 1.9    Thyroid Function Tests: No results for input(s): "TSH", "T4TOTAL", "FREET4", "T3FREE", "THYROIDAB" in the last 72 hours. Sepsis Labs: No results for input(s): "PROCALCITON", "LATICACIDVEN" in the last 168 hours.  Recent Results (from the past 240 hour(s))  SARS Coronavirus 2 by RT PCR (hospital order, performed in [facility] hospital lab) *cepheid single result test* Anterior Nasal Swab      Status: None   Collection Time: 07/23/22  7:05 PM   Specimen: Anterior Nasal Swab  Result Value Ref Range Status   SARS Coronavirus 2 by RT PCR NEGATIVE NEGATIVE Final    Comment: Performed at [facility], [address] [place], [place] [address]    Antimicrobials: Anti-infectives (From admission, onward)    None      Culture/Microbiology    Component Value Date/Time   SDES  03/19/2022 2155    BLOOD SITE NOT SPECIFIED Performed at [facility], [address] [place], [place] [address]    SPECREQUEST  03/19/2022 2155    BOTTLES DRAWN AEROBIC AND ANAEROBIC Blood Culture results may not be optimal due to an excessive volume of blood received in culture bottles Performed at [facility], [address] [place], [place] [address]    CULT  03/19/2022 2155    NO GROWTH 5 DAYS Performed at [facility], [address] [place], [place] [address]    REPTSTATUS 03/25/2022 FINAL 03/19/2022 2155     Exam of radiology Studies: No results found.   LOS: 4 days   Lanae Boast, MD [facility]  07/28/2022, 10:56 AM  All

## 2022-07-28 NOTE — Progress Notes (Addendum)
Progress Note  Patient Name: Allison Duffy Date of Encounter: 07/27/2022  Primary Cardiologist: Nanetta Batty, MD  Subjective   No complaints. No dizziness or chest pain.   Inpatient Medications    Scheduled Meds:  acidophilus  1 capsule Oral QHS   amLODipine  10 mg Oral QHS   apixaban  5 mg Oral BID   cloNIDine  0.1 mg Oral QHS   cloNIDine  0.2 mg Oral q AM   insulin aspart  0-15 Units Subcutaneous TID WC   insulin aspart  0-5 Units Subcutaneous QHS   irbesartan  300 mg Oral Daily   isosorbide-hydrALAZINE  1 tablet Oral TID   ranolazine  500 mg Oral BID   rosuvastatin  20 mg Oral Daily   sodium chloride flush  3 mL Intravenous Q12H   Continuous Infusions:  sodium chloride     PRN Meds: sodium chloride, acetaminophen, hydrALAZINE, ondansetron (ZOFRAN) IV, sodium chloride flush   Vital Signs    Vitals:   07/26/22 2030 07/26/22 2358 07/27/22 0520 07/27/22 0804  BP: (!) 150/58 (!) 147/51 (!) 164/82 (!) 176/62  Pulse: (!) 57 (!) 45 (!) 57 (!) 59  Resp: 18 17 18 18   Temp: 98.4 F (36.9 C) 97.9 F (36.6 C) 98.6 F (37 C) 98.1 F (36.7 C)  TempSrc: Oral Oral Oral Oral  SpO2: 97% 98% 97%   Weight:   64.7 kg   Height:        Intake/Output Summary (Last 24 hours) at 07/27/2022 0958 Last data filed at 07/27/2022 0805 Gross per 24 hour  Intake 834 ml  Output 2800 ml  Net -1966 ml   Filed Weights   07/25/22 0500 07/26/22 0510 07/27/22 0520  Weight: 64.2 kg 64.8 kg 64.7 kg    Telemetry    Sinus-- personally reviewed  ECG    No tracing today  Physical Exam   General: Well developed, well nourished, NAD  HEENT: OP clear, mucus membranes moist Musculoskeletal: Muscle strength 5/5 all ext  Psychiatric: Mood and affect normal  Neck: No JVD Lungs:Clear bilaterally, no wheezes, rhonci, crackles Cardiovascular: Regular rate and rhythm. No murmurs, gallops or rubs. Abdomen:Soft. Bowel sounds present. Non-tender.  Extremities: No lower extremity  edema.   Labs    Chemistry Recent Labs  Lab 07/25/22 0057 07/26/22 0054 07/27/22 0055  NA 137 135 136  K 3.0* 4.5 4.2  CL 99 101 103  CO2 26 25 25   GLUCOSE 167* 164* 157*  BUN 23 30* 28*  CREATININE 1.50* 1.54* 1.36*  CALCIUM 9.3 8.9 8.9  GFRNONAA 32* 31* 36*  ANIONGAP 12 9 8      Hematology Recent Labs  Lab 07/23/22 1725 07/24/22 1046 07/27/22 0055  WBC 7.8 7.4 6.7  RBC 3.53* 3.90 3.36*  HGB 12.1 13.3 11.4*  HCT 33.9* 36.8 33.3*  MCV 96.0 94.4 99.1  MCH 34.3* 34.1* 33.9  MCHC 35.7 36.1* 34.2  RDW 11.7 11.6 11.7  PLT 205 200 170    Cardiac Enzymes Recent Labs  Lab 07/23/22 1725 07/23/22 1925  TROPONINIHS 13 15    BNP Recent Labs  Lab 07/23/22 1925  BNP 191.7*     DDimer Recent Labs  Lab 07/23/22 2138  DDIMER <0.27     Radiology    No results found.   Assessment & Plan    Atypical chest pain: Her pain was felt to be secondary to poorly controlled HTN. Bradycardic on Coreg which has been stopped. Troponin negative. No plans for ischemic  workup in this patient with known non-obstructive CAD by cath in 2022.   HTN: BP is elevated today. Coreg stopped due to sinus brady and dizziness. Consider changing her irbesartan to [facility] for better BP control but given worsening renal function today agree with holding off on this change. Gentle hydration today. Follow BMET tomorrow.   History of A-fib/flutter: Continue Eliquis.     Signed, Marjo Bicker, MD  07/27/2022, 9:58 AM

## 2022-07-28 NOTE — Progress Notes (Signed)
Physical Therapy Treatment Patient Details Name: Allison Duffy MRN: [id number] DOB: 02/29/1926 Today's Date: 07/28/2022   History of Present Illness Pt is 89 year old presented to [facility] on  6/26 for chest pain and SOB. Pt with acute on chronic heart failure. PMH - CAD, HTN, HLD, chronic diastolic and systolic heart failure, afib    PT Comments  Pt admitted with above diagnosis. Pt progressing incr distance today with activity. VSS. Pt also stopped by bathroom and urinated during session as well. Was able to assist in wiping herself. Will continue to follow acutely.  Pt currently with functional limitations due to the deficits listed below (see PT Problem List). Pt will benefit from acute skilled PT to increase their independence and safety with mobility to allow discharge.        Assistance Recommended at Discharge Frequent or constant Supervision/Assistance  If plan is discharge home, recommend the following:  Can travel by private vehicle    A little help with walking and/or transfers;A little help with bathing/dressing/bathroom;Assistance with cooking/housework   Yes  Equipment Recommendations  None recommended by PT    Recommendations for Other Services       Precautions / Restrictions Precautions Precautions: Fall Restrictions Weight Bearing Restrictions: No     Mobility  Bed Mobility Overal bed mobility: Needs Assistance Bed Mobility: Supine to Sit     Supine to sit: Min assist, HOB elevated     General bed mobility comments: A little assist fro trunk elevation    Transfers Overall transfer level: Needs assistance Equipment used: Rolling walker (2 wheels) Transfers: Sit to/from Stand, Bed to chair/wheelchair/BSC Sit to Stand: From elevated surface, Min assist   Step pivot transfers: Min assist       General transfer comment: Assist to power up and for stability.    Ambulation/Gait Ambulation/Gait assistance: Min assist Gait Distance (Feet): 145  Feet Assistive device: Rolling walker (2 wheels) Gait Pattern/deviations: Step-through pattern, Decreased stride length, Trunk flexed Gait velocity: decr Gait velocity interpretation: <1.8 ft/sec, indicate of risk for recurrent falls   General Gait Details: Assist for balance and support and cues to stay close to RW. Pt expresses fatigue with walking to nursing station and back to room.   Stairs             Wheelchair Mobility     Tilt Bed    Modified Rankin (Stroke Patients Only)       Balance Overall balance assessment: Needs assistance Sitting-balance support: No upper extremity supported, Feet supported Sitting balance-Leahy Scale: Fair     Standing balance support: Single extremity supported, Bilateral upper extremity supported Standing balance-Leahy Scale: Poor Standing balance comment: UE support and min assist for static standing as pt washed her hands at sink after going to bathroom                            Cognition Arousal/Alertness: Awake/alert Behavior During Therapy: WFL for tasks assessed/performed Overall Cognitive Status: Within Functional Limits for tasks assessed                                          Exercises General Exercises - Lower Extremity Ankle Circles/Pumps: AROM, Both, 10 reps, Seated Long Arc Quad: AROM, Both, 10 reps, Seated    General Comments General comments (skin integrity, edema, etc.): VSS  Pertinent Vitals/Pain Pain Assessment Pain Assessment: No/denies pain    Home Living                          Prior Function            PT Goals (current goals can now be found in the care plan section) Acute Rehab PT Goals Patient Stated Goal: return home Progress towards PT goals: Progressing toward goals    Frequency    Min 1X/week      PT Plan Current plan remains appropriate    Co-evaluation              AM-PAC PT "6 Clicks" Mobility   Outcome Measure   Help needed turning from your back to your side while in a flat bed without using bedrails?: A Little Help needed moving from lying on your back to sitting on the side of a flat bed without using bedrails?: A Little Help needed moving to and from a bed to a chair (including a wheelchair)?: A Little Help needed standing up from a chair using your arms (e.g., wheelchair or bedside chair)?: A Little Help needed to walk in hospital room?: A Little Help needed climbing 3-5 steps with a railing? : A Lot 6 Click Score: 17    End of Session Equipment Utilized During Treatment: Gait belt Activity Tolerance: Patient limited by fatigue Patient left: in chair;with call bell/phone within reach;with chair alarm set;with family/visitor present Nurse Communication: Mobility status PT Visit Diagnosis: Other abnormalities of gait and mobility (R26.89);Unsteadiness on feet (R26.81);Muscle weakness (generalized) (M62.81)     Time: 4098-1191 PT Time Calculation (min) (ACUTE ONLY): 30 min  Charges:    $Gait Training: 8-22 mins $Therapeutic Exercise: 8-22 mins PT General Charges $$ ACUTE PT VISIT: 1 Visit                     Nesta Kimple M,PT Acute Rehab Services [phone]    Bevelyn Buckles 07/28/2022, 2:44 PM

## 2022-07-28 NOTE — Care Management Important Message (Signed)
Important Message  Patient Details  Name: Allison Duffy MRN: [id number] Date of Birth: 11/16/1926   Medicare Important Message Given:  Yes     Renie Ora 07/28/2022, 8:54 AM

## 2022-07-28 NOTE — Consult Note (Signed)
   [facility] CM Inpatient Consult   07/28/2022  Allison Duffy 04-11-1926 [id number]  [facility] [[facility]]  Accountable Care Organization [ACO] Patient: Medicare ACO REACH  Primary Care Provider:  Geoffry Paradise, MD, with [facility]   Patient was reviewed for any barriers to post hospital care  Patient was screened for hospitalization and on behalf of [facility] Care Coordination to assess for post hospital community care needs.  Patient is being considered for a skilled nursing facility level of care for post hospital transition for rehab.  Came by the room on rounds and patient is working with therapy.  Will continue to follow for disposition and needs. Daughter in the room.  Patient is currently for a SNF for rehab LOC.  If the patient goes to a [facility] affiliated facility then, patient can be followed by [facility] RN with traditional Medicare and approved Medicare Advantage plans.    Plan:   Will notify the [facility] RN can follow for any known or needs for transitional care needs for returning to post facility care coordination needs to return to community.  For questions or referrals, please contact:   Charlesetta Shanks, RN BSN CCM [facility]  [phone] business mobile phone Toll free office [phone]  *Concierge Line  [phone] Fax number: [phone] [email or web address]

## 2022-07-28 NOTE — Progress Notes (Signed)
Mobility Specialist Progress Note:    07/28/22 1533  Mobility  Activity Ambulated with assistance in hallway  Level of Assistance Minimal assist, patient does 75% or more  Assistive Device Front wheel walker  Distance Ambulated (ft) 84 ft  Activity Response Tolerated well  Mobility Referral Yes  $Mobility charge 1 Mobility  Mobility Specialist Start Time (ACUTE ONLY) 1406  Mobility Specialist Stop Time (ACUTE ONLY) 1429  Mobility Specialist Time Calculation (min) (ACUTE ONLY) 23 min   Pt received in chair, agreeable to ambulate. Prior to session pt requested to use the BR, void nad Bm successful. Pt needed MinA to stand and CG during ambulation. Pt had no c/o throughout session. Returned to chair w/ call bell and personal belongings in reach. All needs met.   Thompson Grayer Mobility Specialist  Please contact vis Secure Chat or  Rehab Office [phone]

## 2022-07-29 DIAGNOSIS — R0789 Other chest pain: Secondary | ICD-10-CM | POA: Diagnosis not present

## 2022-07-29 LAB — GLUCOSE, CAPILLARY
Glucose-Capillary: 204 mg/dL — ABNORMAL HIGH (ref 70–99)
Glucose-Capillary: 217 mg/dL — ABNORMAL HIGH (ref 70–99)
Glucose-Capillary: 230 mg/dL — ABNORMAL HIGH (ref 70–99)
Glucose-Capillary: 191 mg/dL — ABNORMAL HIGH (ref 70–99)

## 2022-07-29 LAB — BASIC METABOLIC PANEL
Anion gap: 7 (ref 5–15)
BUN: 26 mg/dL — ABNORMAL HIGH (ref 8–23)
CO2: 24 mmol/L (ref 22–32)
Calcium: 8.7 mg/dL — ABNORMAL LOW (ref 8.9–10.3)
Chloride: 105 mmol/L (ref 98–111)
Creatinine, Ser: 1.64 mg/dL — ABNORMAL HIGH (ref 0.44–1.00)
GFR, Estimated: 29 mL/min — ABNORMAL LOW (ref >60)
Glucose, Bld: 187 mg/dL — ABNORMAL HIGH (ref 70–99)
Potassium: 4.1 mmol/L (ref 3.5–5.1)
Sodium: 136 mmol/L (ref 135–145)

## 2022-07-29 MED ORDER — LINAGLIPTIN 5 MG PO TABS
5.0000 mg | ORAL_TABLET | Freq: Every day | ORAL | Status: DC
  Administered 2022-07-29 – 2022-07-30 (×2): 5 mg via ORAL
  Filled 2022-07-29 (×2): qty 1

## 2022-07-29 NOTE — Progress Notes (Signed)
Progress Note  Patient Name: Allison Duffy Date of Encounter: 07/29/2022  Primary Cardiologist: Nanetta Batty, MD  Subjective   No complaints today. Denies chest pain.   Inpatient Medications    Scheduled Meds:  acidophilus  1 capsule Oral QHS   amLODipine  10 mg Oral QHS   apixaban  2.5 mg Oral BID   cloNIDine  0.1 mg Oral QHS   cloNIDine  0.2 mg Oral q AM   insulin aspart  0-15 Units Subcutaneous TID WC   insulin aspart  0-5 Units Subcutaneous QHS   irbesartan  300 mg Oral Daily   isosorbide-hydrALAZINE  1 tablet Oral TID   linagliptin  5 mg Oral Daily   multivitamin with minerals  1 tablet Oral Q supper   pantoprazole  40 mg Oral Daily   ranolazine  500 mg Oral BID   rosuvastatin  20 mg Oral Daily   sodium chloride flush  3 mL Intravenous Q12H   Continuous Infusions:  sodium chloride     sodium chloride 50 mL/hr at 07/29/22 0833   PRN Meds: sodium chloride, acetaminophen, hydrALAZINE, ondansetron (ZOFRAN) IV, sodium chloride flush   Vital Signs    Vitals:   07/28/22 2035 07/29/22 0030 07/29/22 0454 07/29/22 0728  BP: (!) 137/50 (!) 132/44 (!) 139/58 (!) 127/52  Pulse: 66 68 71 (!) 59  Resp: 18 17 17 20   Temp: 98.5 F (36.9 C) 99.3 F (37.4 C) 99 F (37.2 C) 98.4 F (36.9 C)  TempSrc: Oral Oral Oral Oral  SpO2: 97% 97% 96% 95%  Weight:   64.6 kg   Height:        Intake/Output Summary (Last 24 hours) at 07/29/2022 0938 Last data filed at 07/29/2022 0600 Gross per 24 hour  Intake 1617.71 ml  Output 1150 ml  Net 467.71 ml   Filed Weights   07/27/22 0520 07/28/22 0349 07/29/22 0454  Weight: 64.7 kg 64.7 kg 64.6 kg    Telemetry    Sinus-- personally reviewed  ECG    No tracing today  Physical Exam   General: Well developed, well nourished, NAD  HEENT: OP clear, mucus membranes moist  SKIN: warm, dry. No rashes. Neuro: No focal deficits  Musculoskeletal: Muscle strength 5/5 all ext  Psychiatric: Mood and affect normal  Neck: No JVD, no  carotid bruits, no thyromegaly, no lymphadenopathy.  Lungs:Clear bilaterally, no wheezes, rhonci, crackles Cardiovascular: Regular rate and rhythm. No murmurs, gallops or rubs. Abdomen:Soft. Bowel sounds present. Non-tender.  Extremities: No lower extremity edema. Pulses are 2 + in the bilateral DP/PT.  Labs    Chemistry Recent Labs  Lab 07/27/22 0055 07/28/22 0034 07/29/22 0027  NA 136 135 136  K 4.2 4.2 4.1  CL 103 102 105  CO2 25 26 24   GLUCOSE 157* 124* 187*  BUN 28* 34* 26*  CREATININE 1.36* 1.96* 1.64*  CALCIUM 8.9 8.6* 8.7*  GFRNONAA 36* 23* 29*  ANIONGAP 8 7 7      Hematology Recent Labs  Lab 07/24/22 1046 07/27/22 0055 07/28/22 0034  WBC 7.4 6.7 7.9  RBC 3.90 3.36* 3.08*  HGB 13.3 11.4* 10.3*  HCT 36.8 33.3* 30.5*  MCV 94.4 99.1 99.0  MCH 34.1* 33.9 33.4  MCHC 36.1* 34.2 33.8  RDW 11.6 11.7 11.7  PLT 200 170 158    Cardiac Enzymes Recent Labs  Lab 07/23/22 1725 07/23/22 1925  TROPONINIHS 13 15    BNP Recent Labs  Lab 07/23/22 1925  BNP 191.7*  DDimer Recent Labs  Lab 07/23/22 2138  DDIMER <0.27     Radiology    No results found.   Assessment & Plan    Atypical chest pain: Her pain was not felt to be cardiac related. Her pain was felt to be secondary to poorly controlled HTN. Bradycardic on Coreg which has been stopped. Troponin negative. No plans for ischemic workup in this patient with known non-obstructive CAD by cath in 2022.   HTN: BP is controlled on current therapy. Coreg stopped due to sinus brady and dizziness. Renal function has improved.    History of A-fib/flutter: Continue Eliquis.   Cardiology will sign off. Please call with questions  Signed, Verne Carrow, MD  07/29/2022, 9:38 AM

## 2022-07-29 NOTE — Progress Notes (Signed)
Mobility Specialist Progress Note:    07/29/22 1203  Mobility  Activity Ambulated with assistance in hallway  Level of Assistance Minimal assist, patient does 75% or more  Assistive Device Front wheel walker  Distance Ambulated (ft) 108 ft  Activity Response Tolerated well  Mobility Referral Yes  $Mobility charge 1 Mobility  Mobility Specialist Start Time (ACUTE ONLY) 1005  Mobility Specialist Stop Time (ACUTE ONLY) 1027  Mobility Specialist Time Calculation (min) (ACUTE ONLY) 22 min   Pt received in chair, agreeable to ambulate. Upon standing pt experienced an episode of urinary incontinence, pericare performed. Pt stated she feels much stronger today, no c/o throughout session. Pt assisted back to chair w/ call bell in hand, all needs met. Chair alarm is on.  Thompson Grayer Mobility Specialist  Please contact vis Secure Chat or  Rehab Office [phone]

## 2022-07-29 NOTE — Progress Notes (Addendum)
Daily Progress Note   Patient Name: Allison Duffy       Date: 07/29/2022 DOB: 18-Dec-1926  Age: 89 y.o. MRN#: [id number] Attending Physician: Lanae Boast, MD Primary Care Physician: Geoffry Paradise, MD Admit Date: 07/23/2022  Reason for Consultation/Follow-up: Establishing goals of care  Subjective: Patient is sitting comfortably in bed. Says she would like to be discharged but she is comfortable here and everybody has been friendly and helpful. Her daughter is at bedside.  Length of Stay: 5  Current Medications: Scheduled Meds:   acidophilus  1 capsule Oral QHS   amLODipine  10 mg Oral QHS   apixaban  2.5 mg Oral BID   cloNIDine  0.1 mg Oral QHS   cloNIDine  0.2 mg Oral q AM   insulin aspart  0-15 Units Subcutaneous TID WC   insulin aspart  0-5 Units Subcutaneous QHS   irbesartan  300 mg Oral Daily   isosorbide-hydrALAZINE  1 tablet Oral TID   linagliptin  5 mg Oral Daily   multivitamin with minerals  1 tablet Oral Q supper   pantoprazole  40 mg Oral Daily   ranolazine  500 mg Oral BID   rosuvastatin  20 mg Oral Daily   sodium chloride flush  3 mL Intravenous Q12H    Continuous Infusions:  sodium chloride     sodium chloride 50 mL/hr at 07/29/22 0833    PRN Meds: sodium chloride, acetaminophen, hydrALAZINE, ondansetron (ZOFRAN) IV, sodium chloride flush  Physical Exam Vitals reviewed.  Constitutional:      General: She is awake.  HENT:     Head: Normocephalic and atraumatic.  Pulmonary:     Effort: Pulmonary effort is normal.  Skin:    General: Skin is warm and dry.  Neurological:     Mental Status: She is alert and oriented to person, place, and time.  Psychiatric:        Behavior: Behavior normal.             Vital Signs: BP (!) 127/52 (BP Location: Left  Arm)   Pulse (!) 59   Temp 98.4 F (36.9 C) (Oral)   Resp 20   Ht 5\' 6"  (1.676 m)   Wt 64.6 kg   SpO2 95%   BMI 22.99 kg/m  SpO2: SpO2: 95 % O2 Device: O2  Device: Room Air O2 Flow Rate:    Intake/output summary:  Intake/Output Summary (Last 24 hours) at 07/29/2022 1000 Last data filed at 07/29/2022 0600 Gross per 24 hour  Intake 1617.71 ml  Output 1150 ml  Net 467.71 ml   LBM: Last BM Date : 07/26/22 Baseline Weight: Weight: 67.6 kg Most recent weight: Weight: 64.6 kg       Palliative Assessment/Data:      Patient Active Problem List   Diagnosis Date Noted   Atypical chest pain 07/24/2022   SOB (shortness of breath) 07/24/2022   Acute respiratory failure with hypoxia secondary to community acquired pneumonia 03/29/2021   Acute respiratory failure with hypoxia (HCC) 03/29/2021   Diarrhea 03/29/2021   Lactic acidosis 03/29/2021   Hypokalemia 03/29/2021   Coronary artery disease 04/13/2020   Unstable angina (HCC) 04/11/2020   Mitral regurgitation 03/01/2019   Acute on chronic diastolic CHF (congestive heart failure) (HCC) 01/28/2019   Paroxysmal atrial fibrillation (HCC) 01/28/2019   Colitis, acute 05/31/2016   Primary osteoarthritis of right shoulder 12/27/2014   Pulmonary nodule 06/23/2014   Essential hypertension 07/05/2013   Hyperlipidemia associated with type 2 diabetes mellitus (HCC) 07/05/2013   Type 2 diabetes mellitus with complication, without long-term current use of insulin (HCC) 07/05/2013   Chest pain CAD 07/05/2013    Palliative Care Assessment & Plan   Patient Profile: 89 y.o. female  with past medical history of CAD, HTN, HLD, HFpEF, and afib on anticoagulation admitted on 07/23/2022 with chest pain, shortness of breath, and left arm pain. Cardiology consulted. Serial troponin negative x 2. Cardiology has adjusted her medications.    Assessment: Patient's vitals this morning are improved from initial visit. She has no concerns/ complaints. She  is eager to discharge to discharge to [facility] when her labs have improved.  Encourage patient or daughter to contact PMT with concerns or questions.   Recommendations/Plan: DNR Limited scope- Use MOST form for guidance Discharge to [facility] when medically ready Continued PMT support   Code Status:    Code Status Orders  (From admission, onward)           Start     Ordered   07/24/22 0109  Do not attempt resuscitation (DNR)  Continuous       Question Answer Comment  If patient has no pulse and is not breathing Do Not Attempt Resuscitation   If patient has a pulse and/or is breathing: Medical Treatment Goals COMFORT MEASURES: Keep clean/warm/dry, use medication by any route; positioning, wound care and other measures to relieve pain/suffering; use oxygen, suction/manual treatment of airway obstruction for comfort; do not transfer unless for comfort needs.   Consent: Discussion documented in EHR or advanced directives reviewed      07/24/22 0111            Thank you for allowing the Palliative Medicine Team to assist in the care of this patient.  Time spent: 25 minutes   Detailed review of medical records (labs, imaging, vital signs), medically appropriate exam, documenting clinical information, coordination of care.   Sherryll Burger, NP  Please contact Palliative Medicine Team phone at [phone] for questions and concerns.

## 2022-07-29 NOTE — Progress Notes (Signed)
Occupational Therapy Treatment Patient Details Name: Allison Duffy MRN: [id number] DOB: 05-Dec-1926 Today's Date: 07/29/2022   History of present illness Pt is 89 year old presented to [facility] on  6/26 for chest pain and SOB. Pt with acute on chronic heart failure. PMH - CAD, HTN, HLD, chronic diastolic and systolic heart failure, afib   OT comments  Patient is making progress toward goals.  Patient found seated in bedside chair upon arrival into room and agreeable to OT session.  Patient completed sit to stand from bedside chair with min A.  Functional mobility to sink area completed using RW and min guard.  Patient stood at sink and completed oral and face hygiene with min guard.  Toileting transfer then completed with min A and verbal cues. Patient required min A for sit to stand from toilet due toilet height. Patient completed hand hygiene at sink with min guard and returned to sitting in bedside chair with all needs within reach.   Recommendations for follow up therapy are one component of a multi-disciplinary discharge planning process, led by the attending physician.  Recommendations may be updated based on patient status, additional functional criteria and insurance authorization.    Assistance Recommended at Discharge Frequent or constant Supervision/Assistance  Patient can return home with the following  A lot of help with bathing/dressing/bathroom;A little help with walking and/or transfers;Assistance with cooking/housework;Help with stairs or ramp for entrance;Assist for transportation   Equipment Recommendations  None recommended by OT    Recommendations for Other Services      Precautions / Restrictions Precautions Precautions: Fall Restrictions Weight Bearing Restrictions: No       Mobility Bed Mobility Overal bed mobility: Needs Assistance Bed Mobility: Supine to Sit     Supine to sit: Min assist, HOB elevated     General bed mobility comments: A little assist fro  trunk elevation    Transfers Overall transfer level: Needs assistance Equipment used: Rolling walker (2 wheels) Transfers: Sit to/from Stand, Bed to chair/wheelchair/BSC Sit to Stand: Min assist     Step pivot transfers: Min guard           Balance Overall balance assessment: Needs assistance Sitting-balance support: No upper extremity supported, Feet supported       Standing balance support: Single extremity supported, Bilateral upper extremity supported   Standing balance comment: min guard for standing balance at sink                           ADL either performed or assessed with clinical judgement   ADL Overall ADL's : Needs assistance/impaired Eating/Feeding: Independent   Grooming: Wash/dry hands;Wash/dry face;Oral care;Brushing hair;Standing Grooming Details (indicate cue type and reason): at sink                 Toilet Transfer: Minimal assistance;Regular Toilet;Grab bars;Rolling walker (2 wheels) Toilet Transfer Details (indicate cue type and reason): patient requires min gaurd to lower self onto toilet using grab bar for safety and min A for sit to stand 2/2 low toilet height Toileting- Clothing Manipulation and Hygiene: Min guard       Functional mobility during ADLs: Min guard;Rolling walker (2 wheels)      Extremity/Trunk Assessment              Vision       Perception     Praxis      Cognition Arousal/Alertness: Awake/alert Behavior During Therapy: WFL for tasks assessed/performed Overall  Cognitive Status: Within Functional Limits for tasks assessed                                          Exercises      Shoulder Instructions       General Comments      Pertinent Vitals/ Pain       Pain Assessment Pain Assessment: No/denies pain  Home Living                                          Prior Functioning/Environment              Frequency  Min 2X/week         Progress Toward Goals  OT Goals(current goals can now be found in the care plan section)  Progress towards OT goals: Progressing toward goals  Acute Rehab OT Goals OT Goal Formulation: With patient/family Time For Goal Achievement: 08/08/22 Potential to Achieve Goals: Good ADL Goals Pt Will Perform Grooming: with supervision;with set-up;standing Pt Will Perform Lower Body Bathing: with min assist Pt Will Perform Lower Body Dressing: with min assist Pt Will Transfer to Toilet: with min guard assist;with supervision;ambulating Pt Will Perform Toileting - Clothing Manipulation and hygiene: with min guard assist;with supervision;sit to/from stand  Plan Discharge plan remains appropriate    Co-evaluation                 AM-PAC OT "6 Clicks" Daily Activity     Outcome Measure   Help from another person eating meals?: None Help from another person taking care of personal grooming?: A Little Help from another person toileting, which includes using toliet, bedpan, or urinal?: A Little Help from another person bathing (including washing, rinsing, drying)?: A Lot Help from another person to put on and taking off regular upper body clothing?: A Little Help from another person to put on and taking off regular lower body clothing?: A Lot 6 Click Score: 17    End of Session Equipment Utilized During Treatment: Gait belt;Rolling walker (2 wheels)  OT Visit Diagnosis: Other abnormalities of gait and mobility (R26.89);Muscle weakness (generalized) (M62.81)   Activity Tolerance Patient tolerated treatment well   Patient Left in chair;with call bell/phone within reach;with chair alarm set   Nurse Communication Mobility status        Time: 1610-9604 OT Time Calculation (min): 27 min  Charges: OT Treatments $Self Care/Home Management : 23-37 mins  Governor Specking OT/L Denice Paradise 07/29/2022, 2:07 PM

## 2022-07-29 NOTE — Progress Notes (Signed)
PT Cancellation Note  Patient Details Name: Allison Duffy MRN: [id number] DOB: 1926/09/08   Cancelled Treatment:    Reason Eval/Treat Not Completed: (P) Other (comment) (Pt fatigued from ambulation with mob tech, will f/u per POC.)   Ronel Rodeheaver J Aundria Rud 07/29/2022, 1:00 PM  .aim

## 2022-07-29 NOTE — Progress Notes (Signed)
PROGRESS NOTE Allison Duffy  ZOX:[id number] DOB: 1926/05/13 DOA: 07/23/2022 PCP: Geoffry Paradise, MD  Brief Narrative/Hospital Course: 89 year old female with past medical history of CAD, HTN, HLD, chronic diastolic and systolic heart failure preserved EF, and atrial fibrillation on chronic anticoagulation presents with intermittent chest pain, weakness  Seen in the ED, EKG NSR troponin normal, given history of CAD and CHF admitted with cardiology consult placed on IV diuretics Patient seen by cardiology serial troponin negative x 2 EKG reassuring, advised blood pressure control, Coreg and Ranexa, felt to be euvolemic and advised her  home CHF meds.    Subjective: Seen and examined, Overnight vitals/labs/events reviewed  Daughter at bedside Was sleepy all day yesterday better this am Urine appears lighter  Assessment and Plan: Principal Problem:   Atypical chest pain Active Problems:   Coronary artery disease   Type 2 diabetes mellitus with complication, without long-term current use of insulin (HCC)   Essential hypertension   SOB (shortness of breath)  Atypical chest pain: Likely secondary to poorly controlled hypertension.  She had nonobstructive CAD by LHC in 2022.  No chest pain, continue Ranexa, BiDil   Dizziness with bradycardia heart rate in 39-40: Resolved after Coreg is discontinued.  Avoid beta-blocker tolerating clonidine  Hypertension: Now well controlled. Coreg discontinued due to bradycardia. Continue amlodipine 10, Avapro, clonidine 0.1 bedtime, 0.2 morning and Bidil (changed from hydralazine on 6/30)  Dyspnea on exertion  likely in the setting of uncontrolled hypertension.  Optimize blood pressure meds.  History of a flutter/A-fib: On Eliquis, holding beta-blocker due to bradycardia  Acute on chronic diastolic CHF: Admitted on IV diuretics-held due to AKI.  Currently remains mildly hypovolemic but improving with IV fluids  GDMT with BiDil, ARB per  cardiology .  No Entresto due to AKI Cont to monitor intake output Daily weight.  Net IO Since Admission: -5,018.29 mL [07/29/22 0928]  Filed Weights   07/27/22 0520 07/28/22 0349 07/29/22 0454  Weight: 64.7 kg 64.7 kg 64.6 kg     AKI: In the setting of volume depletion/diuretics uncontrolled hypertension > peaked to 1.9 now improving continue gentle IVF encourage p.o.   Recent Labs    11/07/21 1724 03/19/22 2040 07/23/22 1725 07/25/22 0057 07/26/22 0054 07/27/22 0055 07/28/22 0034 07/29/22 0027  BUN 27* 26* 21 23 30* 28* 34* 26*  CREATININE 1.30* 1.15* 1.24* 1.50* 1.54* 1.36* 1.96* 1.64*  CO2 22 23 25 26 25 25 26 24      T2DM with complication w/ hyperglycemia: Blood sugar is borderline controlled.  Resume Januvia.  Recently taken off Glucotrol by her PCP, was on metformin but currentlyon hold due to low EGFR. Monitor. Recent Labs  Lab 07/24/22 1046 07/24/22 1106 07/28/22 0605 07/28/22 1116 07/28/22 1536 07/28/22 2032 07/29/22 0526  GLUCAP  --    < > 163* 264* 197* 202* 204*  HGBA1C 7.3*  --   --   --   --   --   --    < > = values in this interval not displayed.     Hypomagnesemia: resolved  Deconditioning/debility/advanced age: Encourage PT OT, planning for skilled nursing facility, CODE STATUS DNR, consulted palliative care given multiple comorbidities, advanced age, high risk of readmission> DNR, no feeding tube, MOST form filled out today  DVT prophylaxis: apixaban (ELIQUIS) tablet 2.5 mg Start: 07/28/22 2200Eliquis Code Status:   Code Status: DNR Family Communication: plan of care discussed with patient at bedside. Patient status is:  inpatient  because of chest pain, bradycardia Level  of care: Telemetry Cardiac   Dispo: The patient is from: home            Anticipated disposition: SNF tomorrow if creatinine improves further Objective: Vitals last 24 hrs: Vitals:   07/28/22 2035 07/29/22 0030 07/29/22 0454 07/29/22 0728  BP: (!) 137/50 (!) 132/44 (!)  139/58 (!) 127/52  Pulse: 66 68 71 (!) 59  Resp: 18 17 17 20   Temp: 98.5 F (36.9 C) 99.3 F (37.4 C) 99 F (37.2 C) 98.4 F (36.9 C)  TempSrc: Oral Oral Oral Oral  SpO2: 97% 97% 96% 95%  Weight:   64.6 kg   Height:       Weight change: -0.1 kg  Physical Examination: General exam: alert awake, oriented and pleasant HEENT:Oral mucosa moist, Ear/Nose WNL grossly Respiratory system: Bilaterally clear BS,no use of accessory muscle Cardiovascular system: S1 & S2 +, No JVD. Gastrointestinal system: Abdomen soft,NT,ND, BS+ Nervous System: Alert, awake, moving all extremities,and following commands. Extremities: LE edema neg,distal peripheral pulses palpable and warm.  Skin: No rashes,no icterus. MSK: Normal muscle bulk,tone, power   Medications reviewed:  Scheduled Meds:  acidophilus  1 capsule Oral QHS   amLODipine  10 mg Oral QHS   apixaban  2.5 mg Oral BID   cloNIDine  0.1 mg Oral QHS   cloNIDine  0.2 mg Oral q AM   insulin aspart  0-15 Units Subcutaneous TID WC   insulin aspart  0-5 Units Subcutaneous QHS   irbesartan  300 mg Oral Daily   isosorbide-hydrALAZINE  1 tablet Oral TID   linagliptin  5 mg Oral Daily   multivitamin with minerals  1 tablet Oral Q supper   pantoprazole  40 mg Oral Daily   ranolazine  500 mg Oral BID   rosuvastatin  20 mg Oral Daily   sodium chloride flush  3 mL Intravenous Q12H   Continuous Infusions:  sodium chloride     sodium chloride 50 mL/hr at 07/29/22 1610    Diet Order             Diet heart healthy/carb modified Room service appropriate? Yes; Fluid consistency: Thin  Diet effective now                  Intake/Output Summary (Last 24 hours) at 07/29/2022 0928 Last data filed at 07/29/2022 0600 Gross per 24 hour  Intake 1617.71 ml  Output 1150 ml  Net 467.71 ml    Net IO Since Admission: -5,018.29 mL [07/29/22 0928]  Wt Readings from Last 3 Encounters:  07/29/22 64.6 kg  10/08/21 70.9 kg  05/30/21 74.4 kg    Unresulted  Labs (From admission, onward)     Start     Ordered   07/27/22 0500  Basic metabolic panel  Daily,   R     Question:  Specimen collection method  Answer:  Lab=Lab collect   07/26/22 1112          Data Reviewed: I have personally reviewed following labs and imaging studies CBC: Recent Labs  Lab 07/23/22 1725 07/24/22 1046 07/27/22 0055 07/28/22 0034  WBC 7.8 7.4 6.7 7.9  NEUTROABS  --  4.3  --   --   HGB 12.1 13.3 11.4* 10.3*  HCT 33.9* 36.8 33.3* 30.5*  MCV 96.0 94.4 99.1 99.0  PLT 205 200 170 158    Basic Metabolic Panel: Recent Labs  Lab 07/24/22 1046 07/25/22 0057 07/25/22 1000 07/26/22 0054 07/27/22 0055 07/28/22 0034 07/29/22 0027  NA  --  137  --  135 136 135 136  K  --  3.0*  --  4.5 4.2 4.2 4.1  CL  --  99  --  101 103 102 105  CO2  --  26  --  25 25 26 24   GLUCOSE  --  167*  --  164* 157* 124* 187*  BUN  --  23  --  30* 28* 34* 26*  CREATININE  --  1.50*  --  1.54* 1.36* 1.96* 1.64*  CALCIUM  --  9.3  --  8.9 8.9 8.6* 8.7*  MG 1.5*  --  2.0  --   --   --   --     No results for input(s): "HGBA1C" in the last 72 hours.  CBG: Recent Labs  Lab 07/28/22 0605 07/28/22 1116 07/28/22 1536 07/28/22 2032 07/29/22 0526  GLUCAP 163* 264* 197* 202* 204*    Lipid Profile: No results for input(s): "CHOL", "HDL", "LDLCALC", "TRIG", "CHOLHDL", "LDLDIRECT" in the last 72 hours.  Thyroid Function Tests: No results for input(s): "TSH", "T4TOTAL", "FREET4", "T3FREE", "THYROIDAB" in the last 72 hours. Sepsis Labs: No results for input(s): "PROCALCITON", "LATICACIDVEN" in the last 168 hours.  Recent Results (from the past 240 hour(s))  SARS Coronavirus 2 by RT PCR (hospital order, performed in [facility] hospital lab) *cepheid single result test* Anterior Nasal Swab     Status: None   Collection Time: 07/23/22  7:05 PM   Specimen: Anterior Nasal Swab  Result Value Ref Range Status   SARS Coronavirus 2 by RT PCR NEGATIVE NEGATIVE Final    Comment:  Performed at [facility], [address], [place], [place] [address]    Antimicrobials: Anti-infectives (From admission, onward)    None      Culture/Microbiology    Component Value Date/Time   SDES  03/19/2022 2155    BLOOD SITE NOT SPECIFIED Performed at [facility], [address] [place], [place] [address]    SPECREQUEST  03/19/2022 2155    BOTTLES DRAWN AEROBIC AND ANAEROBIC Blood Culture results may not be optimal due to an excessive volume of blood received in culture bottles Performed at [facility], [address], [place], [place] [address]    CULT  03/19/2022 2155    NO GROWTH 5 DAYS Performed at [facility], [address], [place], [place] [address]    REPTSTATUS 03/25/2022 FINAL 03/19/2022 2155     Exam of radiology Studies: No results found.   LOS: 5 days   Lanae Boast, MD [facility]  07/29/2022, 9:28 AM  All

## 2022-07-30 DIAGNOSIS — R001 Bradycardia, unspecified: Secondary | ICD-10-CM | POA: Diagnosis not present

## 2022-07-30 DIAGNOSIS — E118 Type 2 diabetes mellitus with unspecified complications: Secondary | ICD-10-CM | POA: Diagnosis not present

## 2022-07-30 DIAGNOSIS — R0602 Shortness of breath: Secondary | ICD-10-CM | POA: Diagnosis not present

## 2022-07-30 DIAGNOSIS — E785 Hyperlipidemia, unspecified: Secondary | ICD-10-CM | POA: Diagnosis not present

## 2022-07-30 DIAGNOSIS — I4891 Unspecified atrial fibrillation: Secondary | ICD-10-CM | POA: Diagnosis not present

## 2022-07-30 DIAGNOSIS — K219 Gastro-esophageal reflux disease without esophagitis: Secondary | ICD-10-CM | POA: Diagnosis not present

## 2022-07-30 DIAGNOSIS — I5033 Acute on chronic diastolic (congestive) heart failure: Secondary | ICD-10-CM | POA: Diagnosis not present

## 2022-07-30 DIAGNOSIS — Z7901 Long term (current) use of anticoagulants: Secondary | ICD-10-CM | POA: Diagnosis not present

## 2022-07-30 DIAGNOSIS — Z7401 Bed confinement status: Secondary | ICD-10-CM | POA: Diagnosis not present

## 2022-07-30 DIAGNOSIS — R079 Chest pain, unspecified: Secondary | ICD-10-CM | POA: Diagnosis not present

## 2022-07-30 DIAGNOSIS — E559 Vitamin D deficiency, unspecified: Secondary | ICD-10-CM | POA: Diagnosis not present

## 2022-07-30 DIAGNOSIS — R0609 Other forms of dyspnea: Secondary | ICD-10-CM | POA: Diagnosis not present

## 2022-07-30 DIAGNOSIS — K589 Irritable bowel syndrome without diarrhea: Secondary | ICD-10-CM | POA: Diagnosis not present

## 2022-07-30 DIAGNOSIS — Z66 Do not resuscitate: Secondary | ICD-10-CM | POA: Diagnosis not present

## 2022-07-30 DIAGNOSIS — I350 Nonrheumatic aortic (valve) stenosis: Secondary | ICD-10-CM | POA: Diagnosis not present

## 2022-07-30 DIAGNOSIS — I5030 Unspecified diastolic (congestive) heart failure: Secondary | ICD-10-CM | POA: Diagnosis not present

## 2022-07-30 DIAGNOSIS — I2510 Atherosclerotic heart disease of native coronary artery without angina pectoris: Secondary | ICD-10-CM | POA: Diagnosis not present

## 2022-07-30 DIAGNOSIS — R531 Weakness: Secondary | ICD-10-CM | POA: Diagnosis not present

## 2022-07-30 DIAGNOSIS — J9601 Acute respiratory failure with hypoxia: Secondary | ICD-10-CM | POA: Diagnosis not present

## 2022-07-30 DIAGNOSIS — I10 Essential (primary) hypertension: Secondary | ICD-10-CM | POA: Diagnosis not present

## 2022-07-30 DIAGNOSIS — I480 Paroxysmal atrial fibrillation: Secondary | ICD-10-CM | POA: Diagnosis not present

## 2022-07-30 DIAGNOSIS — H04123 Dry eye syndrome of bilateral lacrimal glands: Secondary | ICD-10-CM | POA: Diagnosis not present

## 2022-07-30 DIAGNOSIS — R0789 Other chest pain: Secondary | ICD-10-CM | POA: Diagnosis not present

## 2022-07-30 LAB — GLUCOSE, CAPILLARY
Glucose-Capillary: 166 mg/dL — ABNORMAL HIGH (ref 70–99)
Glucose-Capillary: 241 mg/dL — ABNORMAL HIGH (ref 70–99)

## 2022-07-30 LAB — BASIC METABOLIC PANEL
Anion gap: 8 (ref 5–15)
BUN: 27 mg/dL — ABNORMAL HIGH (ref 8–23)
CO2: 21 mmol/L — ABNORMAL LOW (ref 22–32)
Calcium: 8.6 mg/dL — ABNORMAL LOW (ref 8.9–10.3)
Chloride: 103 mmol/L (ref 98–111)
Creatinine, Ser: 1.54 mg/dL — ABNORMAL HIGH (ref 0.44–1.00)
GFR, Estimated: 31 mL/min — ABNORMAL LOW (ref >60)
Glucose, Bld: 218 mg/dL — ABNORMAL HIGH (ref 70–99)
Potassium: 3.9 mmol/L (ref 3.5–5.1)
Sodium: 132 mmol/L — ABNORMAL LOW (ref 135–145)

## 2022-07-30 MED ORDER — ISOSORB DINITRATE-HYDRALAZINE 20-37.5 MG PO TABS: 1.0000 | ORAL_TABLET | Freq: Three times a day (TID) | ORAL | Status: DC

## 2022-07-30 MED ORDER — APIXABAN 2.5 MG PO TABS: 2.5000 mg | ORAL_TABLET | Freq: Two times a day (BID) | ORAL | Status: DC

## 2022-07-30 NOTE — Discharge Summary (Signed)
Physician Discharge Summary  Allison Duffy:[id number] DOB: 1926/08/02 DOA: 07/23/2022  PCP: Geoffry Paradise, MD  Admit date: 07/23/2022 Discharge date: 07/30/2022 Recommendations for Outpatient Follow-up:  Follow up with PCP in 1 weeks-call for appointment Follow up with cardiology Please obtain BMP/CBC in one week  Discharge Dispo: SNF Discharge Condition: Stable Code Status:   Code Status: DNR Diet recommendation:  Diet Order             Diet heart healthy/carb modified Room service appropriate? Yes; Fluid consistency: Thin  Diet effective now                  Brief/Interim Summary: 89 year old female with past medical history of CAD, HTN, HLD, chronic diastolic and systolic heart failure preserved EF, and atrial fibrillation on chronic anticoagulation presents with intermittent chest pain, weakness  Seen in the ED, EKG NSR troponin normal, given history of CAD and CHF admitted with cardiology consult placed on IV diuretics Patient seen by cardiology serial troponin negative x 2 EKG reassuring, advised blood pressure control-medical management. Patient had bradycardia heart rate in 30s to 40s Coreg discontinued following with heart rate stabilized.  BP meds adjusted changed to Bidil, continued on clonidine ARB.  She had elevated creatinine for which she was hydrated creatinine now downtrending She is awake decondition and being discharged to skilled nursing facility Extensively discussed to monitor weight and follow-up with cardiology   Discharge Diagnoses:  Principal Problem:   Atypical chest pain Active Problems:   Coronary artery disease   Type 2 diabetes mellitus with complication, without long-term current use of insulin (HCC)   Essential hypertension   SOB (shortness of breath)  Atypical chest pain: Likely secondary to poorly controlled hypertension.  She had nonobstructive CAD by LHC in 2022.  No more chest pain, continue Ranexa, BiDil    Dizziness with  bradycardia heart rate in 39-40: Resolved after Coreg is discontinued.  Avoid beta-blocker tolerating clonidine. HR better.   Hypertension: Now well controlled meds adjusted:coreg discontinued due to bradycardia. Continue amlodipine 10, Avapro, clonidine 0.1 bedtime, 0.2 morning and Bidil (changed from hydralazine on 6/30)   Dyspnea on exertion  likely in the setting of uncontrolled hypertension.  Optimized blood pressure meds.   History of a flutter/A-fib: On Eliquis> changed to renal dosing: If creatinine improves in 1 to <1.5 will need to switch her back to previous full dosing follow-up BMP in 1 month.Holding beta-blocker due to bradycardia   Acute on chronic diastolic CHF: Admitted on IV diuretics-held due to AKI.  Currently remains mildly hypovolemic but improving with IV fluids  GDMT with BiDil, ARB per cardiology .  No Entresto due to AKI Cont to monitor Daily weight at SNF. Please monitor weight daily, avoid strain and stop activity anytime you have chest pain or worsening shortness of breath.Anytime you have any of the following symptoms: 1) 3 pound weight gain in 24 hours or 5 pounds in 1 week 2) shortness of breath, with or without a dry hacking cough 3) swelling in the hands, feet or stomach 4) if you have to sleep on extra pillows at night in order to breathe.  Net IO Since Admission: -5,503.29 mL [07/30/22 1241]   AKI: In the setting of volume depletion/diuretics uncontrolled hypertension > peaked to 1.9 now improving continue gentle IVF encourage p.o.   Recent Labs  Lab 07/26/22 0054 07/27/22 0055 07/28/22 0034 07/29/22 0027 07/30/22 0030  BUN 30* 28* 34* 26* 27*  CREATININE 1.54* 1.36* 1.96* 1.64* 1.54*  T2DM with complication w/ hyperglycemia: Blood sugar is borderline controlled.  Resume Januvia.  Recently taken off Glucotrol by her PCP, was on metformin but currentlyon hold due to low EGFR> resume metformin if creat iproves furtherMonitor. Recent Labs  Lab  07/24/22 1046 07/24/22 1106 07/29/22 1116 07/29/22 1519 07/29/22 2119 07/30/22 0551 07/30/22 1136  GLUCAP  --    < > 217* 230* 191* 166* 241*  HGBA1C 7.3*  --   --   --   --   --   --    < > = values in this interval not displayed.    Hypomagnesemia: resolved   Deconditioning/debility/advanced age: Encourage PT OT, planning for skilled nursing facility, CODE STATUS DNR, consulted palliative care given multiple comorbidities, advanced age, high risk of readmission> DNR, no feeding tube, MOST form filled out   Consults: Cardio Palliative care Subjective: Aaox3, sitting comfortably family at the bedside negative for discharge to skilled nursing facility  Discharge Exam: Vitals:   07/30/22 0802 07/30/22 1138  BP: (!) 130/43 (!) 146/48  Pulse: (!) 54 (!) 52  Resp: 18 18  Temp: 97.8 F (36.6 C) 97.8 F (36.6 C)  SpO2: 96% 96%   General: Pt is alert, awake, not in acute distress Cardiovascular: RRR, S1/S2 +, no rubs, no gallops Respiratory: CTA bilaterally, no wheezing, no rhonchi Abdominal: Soft, NT, ND, bowel sounds + Extremities: no edema, no cyanosis  Discharge Instructions  Discharge Instructions     Discharge instructions   Complete by: As directed    Please call call MD or return to ER for similar or worsening recurring problem that brought you to hospital or if any fever,nausea/vomiting,abdominal pain, uncontrolled pain, chest pain,  shortness of breath or any other alarming symptoms.  Please follow-up your doctor as instructed in a week time and call the office for appointment.  Please avoid alcohol, smoking, or any other illicit substance and maintain healthy habits including taking your regular medications as prescribed.  You were cared for by a hospitalist during your hospital stay. If you have any questions about your discharge medications or the care you received while you were in the hospital after you are discharged, you can call the unit and ask to speak  with the hospitalist on call if the hospitalist that took care of you is not available.  Once you are discharged, your primary care physician will handle any further medical issues. Please note that NO REFILLS for any discharge medications will be authorized once you are discharged, as it is imperative that you return to your primary care physician (or establish a relationship with a primary care physician if you do not have one) for your aftercare needs so that they can reassess your need for medications and monitor your lab values.  Please monitor weight daily, avoid strain and stop activity anytime you have chest pain or worsening shortness of breath. Anytime you have any of the following symptoms: 1) 3 pound weight gain in 24 hours or 5 pounds in 1 week 2) shortness of breath, with or without a dry hacking cough 3) swelling in the hands, feet or stomach 4) if you have to sleep on extra pillows at night in order to breathe.   Increase activity slowly   Complete by: As directed       Allergies as of 07/30/2022       Reactions   Codeine Nausea And Vomiting   Hydrocodone    Nausea and vomiting   Prednisone  Other reaction(s): sick on her stomach   Sulfa Antibiotics Itching, Nausea Only, Other (See Comments)        Medication List     STOP taking these medications    carvedilol 12.5 MG tablet Commonly known as: COREG   furosemide 40 MG tablet Commonly known as: LASIX   glipiZIDE 10 MG 24 hr tablet Commonly known as: GLUCOTROL XL   metFORMIN 500 MG tablet Commonly known as: GLUCOPHAGE   potassium chloride SA 20 MEQ tablet Commonly known as: KLOR-CON M       TAKE these medications    amLODipine 10 MG tablet Commonly known as: NORVASC Take 10 mg by mouth at bedtime.   apixaban 2.5 MG Tabs tablet Commonly known as: ELIQUIS Take 1 tablet (2.5 mg total) by mouth 2 (two) times daily. What changed:  medication strength how much to take   carboxymethylcellulose 1 %  ophthalmic solution Place 1 drop into both eyes 2 (two) times daily.   cholecalciferol 25 MCG (1000 UNIT) tablet Commonly known as: VITAMIN D3 Take 1,000 Units by mouth daily with lunch.   cholestyramine 4 g packet Commonly known as: QUESTRAN Take 4 g by mouth daily as needed (IBS).   cloNIDine 0.1 MG tablet Commonly known as: CATAPRES TAKE 2 TABLETS BY MOUTH EVERY MORNING AND TAKE 1 TABLET BY MOUTH EVERY NIGHT AT BEDTIME What changed: additional instructions   isosorbide-hydrALAZINE 20-37.5 MG tablet Commonly known as: BIDIL Take 1 tablet by mouth 3 (three) times daily.   multivitamin with minerals Tabs tablet Take 1 tablet by mouth daily with supper.   nitroGLYCERIN 0.4 MG SL tablet Commonly known as: NITROSTAT PLACE 1 TABLET (0.4 MG TOTAL) UNDER THE TONGUE EVERY FIVE MINUTES X 3 DOSES AS NEEDED FOR CHEST PAIN. What changed:  how much to take when to take this   olmesartan 40 MG tablet Commonly known as: BENICAR Take 40 mg by mouth at bedtime.   omeprazole 40 MG capsule Commonly known as: PRILOSEC TAKE 1 CAPSULE BY MOUTH DAILY WITH SUPPER What changed: See the new instructions.   OneTouch Ultra test strip Generic drug: glucose blood 1 each by Other route as needed for other.   onetouch ultrasoft lancets use to check blood sugar twice a day as directed   Pro-biotic Blend Caps Take 1 capsule by mouth at bedtime. Takes OTC probiotic   ranolazine 500 MG 12 hr tablet Commonly known as: RANEXA TAKE 1 TABLET(500 MG) BY MOUTH TWICE DAILY What changed: See the new instructions.   rosuvastatin 20 MG tablet Commonly known as: CRESTOR TAKE 1 TABLET(20 MG) BY MOUTH AT BEDTIME What changed: See the new instructions.   sitaGLIPtin 100 MG tablet Commonly known as: JANUVIA Take 100 mg by mouth daily with lunch.        Contact information for follow-up providers     Ronney Asters, NP Follow up.   Specialty: Cardiology Why: [facility] - [facility] location -  cardiology follow-up arranged with nurse practitioner Verdon Cummins with our team on Monday August 04, 2022 10:55 AM (Arrive by 10:40 AM) Contact information: [address] [place] [address] [phone]              Contact information for after-discharge care     Destination     [facility] [place] Preferred SNF .   Service: Skilled Nursing Contact information: [address] [place] [address] [phone]  Allergies  Allergen Reactions   Codeine Nausea And Vomiting   Hydrocodone     Nausea and vomiting   Prednisone     Other reaction(s): sick on her stomach   Sulfa Antibiotics Itching, Nausea Only and Other (See Comments)    The results of significant diagnostics from this hospitalization (including imaging, microbiology, ancillary and laboratory) are listed below for reference.    Microbiology: Recent Results (from the past 240 hour(s))  SARS Coronavirus 2 by RT PCR (hospital order, performed in [facility] hospital lab) *cepheid single result test* Anterior Nasal Swab     Status: None   Collection Time: 07/23/22  7:05 PM   Specimen: Anterior Nasal Swab  Result Value Ref Range Status   SARS Coronavirus 2 by RT PCR NEGATIVE NEGATIVE Final    Comment: Performed at [facility], [address], [place], [place] [address]    Procedures/Studies: ECHOCARDIOGRAM COMPLETE  Result Date: 07/24/2022    ECHOCARDIOGRAM REPORT   Patient Name:   Allison Duffy Date of Exam: 07/24/2022 Medical Rec #:  [id number]        Height:       66.0 in Accession #:    [id number]       Weight:       144.5 lb Date of Birth:  January 14, 1927        BSA:          1.742 m Patient Age:    89 years         BP:           172/65 mmHg Patient Gender: F                HR:           55 bpm. Exam Location:  Inpatient Procedure: 2D Echo, Cardiac Doppler and Color Doppler Indications:    Chest pain  History:        Patient has prior  history of Echocardiogram examinations, most                 recent 03/30/2021. CHF, CAD, Arrythmias:Atrial Fibrillation,                 Signs/Symptoms:Shortness of Breath and Chest Pain; Risk                 Factors:Hypertension, Dyslipidemia and Diabetes.  Sonographer:    Milda Smart Referring Phys: [phone] DEBBY CROSLEY  Sonographer Comments: Image acquisition challenging due to respiratory motion. IMPRESSIONS  1. Left ventricular ejection fraction, by estimation, is 60 to 65%. The left ventricle has normal function. The left ventricle has no regional wall motion abnormalities. There is moderate basal septal hypertrophy. The rest of the LV segments demonstrate  mild concentric left ventricular hypertrophy of the basal-septal segment. Left ventricular diastolic parameters are consistent with Grade II diastolic dysfunction (pseudonormalization).  2. Right ventricular systolic function is normal. The right ventricular size is normal.  3. Left atrial size was moderately dilated.  4. The mitral valve is grossly normal. Mild mitral valve regurgitation.  5. The aortic valve is tricuspid. There is mild calcification of the aortic valve. There is mild thickening of the aortic valve. Aortic valve regurgitation is not visualized. Aortic valve sclerosis/calcification is present, without any evidence of aortic stenosis.  6. The inferior vena cava is normal in size with greater than 50% respiratory variability, suggesting right atrial pressure of 3 mmHg. Comparison(s): No significant change from prior study. FINDINGS  Left Ventricle:  Left ventricular ejection fraction, by estimation, is 60 to 65%. The left ventricle has normal function. The left ventricle has no regional wall motion abnormalities. The left ventricular internal cavity size was normal in size. There is  moderate basal septal hypertrophy. The rest of the LV segments demonstrate mild concentric left ventricular hypertrophy of the basal-septal segment. Left  ventricular diastolic parameters are consistent with Grade II diastolic dysfunction (pseudonormalization). Right Ventricle: The right ventricular size is normal. No increase in right ventricular wall thickness. Right ventricular systolic function is normal. Left Atrium: Left atrial size was moderately dilated. Right Atrium: Right atrial size was normal in size. Pericardium: There is no evidence of pericardial effusion. Mitral Valve: The mitral valve is grossly normal. There is mild thickening of the mitral valve leaflet(s). There is mild calcification of the mitral valve leaflet(s). Mild mitral valve regurgitation. MV peak gradient, 5.5 mmHg. The mean mitral valve gradient is 2.0 mmHg. Tricuspid Valve: The tricuspid valve is normal in structure. Tricuspid valve regurgitation is mild. Aortic Valve: The aortic valve is tricuspid. There is mild calcification of the aortic valve. There is mild thickening of the aortic valve. Aortic valve regurgitation is not visualized. Aortic valve sclerosis/calcification is present, without any evidence of aortic stenosis. Pulmonic Valve: The pulmonic valve was normal in structure. Pulmonic valve regurgitation is trivial. Aorta: The aortic root is normal in size and structure. Venous: The inferior vena cava is normal in size with greater than 50% respiratory variability, suggesting right atrial pressure of 3 mmHg. IAS/Shunts: The atrial septum is grossly normal.  LEFT VENTRICLE PLAX 2D LVIDd:         4.10 cm     Diastology LVIDs:         2.70 cm     LV e' medial:    2.68 cm/s LV PW:         1.20 cm     LV E/e' medial:  25.6 LV IVS:        1.70 cm     LV e' lateral:   10.00 cm/s LVOT diam:     2.10 cm     LV E/e' lateral: 6.9 LV SV:         91 LV SV Index:   52 LVOT Area:     3.46 cm  LV Volumes (MOD) LV vol d, MOD A2C: 80.7 ml LV vol d, MOD A4C: 86.0 ml LV vol s, MOD A2C: 25.2 ml LV vol s, MOD A4C: 29.0 ml LV SV MOD A2C:     55.5 ml LV SV MOD A4C:     86.0 ml LV SV MOD BP:      56.1  ml RIGHT VENTRICLE RV Basal diam:  3.30 cm RV S prime:     9.99 cm/s TAPSE (M-mode): 1.8 cm LEFT ATRIUM             Index        RIGHT ATRIUM           Index LA diam:        5.40 cm 3.10 cm/m   RA Area:     12.80 cm LA Vol (A2C):   77.1 ml 44.26 ml/m  RA Volume:   27.10 ml  15.56 ml/m LA Vol (A4C):   73.8 ml 42.37 ml/m LA Biplane Vol: 76.0 ml 43.63 ml/m  AORTIC VALVE LVOT Vmax:   98.80 cm/s LVOT Vmean:  76.100 cm/s LVOT VTI:    0.263 m  AORTA Ao Root diam: 2.90  cm Ao Asc diam:  3.30 cm MITRAL VALVE               TRICUSPID VALVE MV Area (PHT): 1.54 cm    TR Peak grad:   23.2 mmHg MV Area VTI:   2.37 cm    TR Vmax:        241.00 cm/s MV Peak grad:  5.5 mmHg MV Mean grad:  2.0 mmHg    SHUNTS MV Vmax:       1.17 m/s    Systemic VTI:  0.26 m MV Vmean:      62.6 cm/s   Systemic Diam: 2.10 cm MV Decel Time: 493 msec MR Peak grad: 93.7 mmHg MR Vmax:      484.00 cm/s MV E velocity: 68.60 cm/s MV A velocity: 48.00 cm/s MV E/A ratio:  1.43 Laurance Flatten MD Electronically signed by Laurance Flatten MD Signature Date/Time: 07/24/2022/10:50:04 AM    Final    DG Chest Port 1 View  Result Date: 07/23/2022 CLINICAL DATA:  Chest pressure EXAM: PORTABLE CHEST 1 VIEW COMPARISON:  03/19/2022 FINDINGS: Normal cardiac and mediastinal contours. Aortic atherosclerosis. No focal pulmonary opacity. No pleural effusion or pneumothorax. No acute osseous abnormality. Status post right reverse shoulder arthroplasty. A advanced degenerative changes in the left shoulder. Surgical clips overlie the superior mediastinum. IMPRESSION: No active disease. Electronically Signed   By: Wiliam Ke M.D.   On: 07/23/2022 17:49    Labs: BNP (last 3 results) Recent Labs    07/23/22 1925  BNP 191.7*   Basic Metabolic Panel: Recent Labs  Lab 07/24/22 1046 07/25/22 0057 07/25/22 1000 07/26/22 0054 07/27/22 0055 07/28/22 0034 07/29/22 0027 07/30/22 0030  NA  --    < >  --  135 136 135 136 132*  K  --    < >  --  4.5 4.2 4.2  4.1 3.9  CL  --    < >  --  101 103 102 105 103  CO2  --    < >  --  25 25 26 24  21*  GLUCOSE  --    < >  --  164* 157* 124* 187* 218*  BUN  --    < >  --  30* 28* 34* 26* 27*  CREATININE  --    < >  --  1.54* 1.36* 1.96* 1.64* 1.54*  CALCIUM  --    < >  --  8.9 8.9 8.6* 8.7* 8.6*  MG 1.5*  --  2.0  --   --   --   --   --    < > = values in this interval not displayed.   Recent Labs  Lab 07/23/22 1725 07/24/22 1046 07/27/22 0055 07/28/22 0034  WBC 7.8 7.4 6.7 7.9  NEUTROABS  --  4.3  --   --   HGB 12.1 13.3 11.4* 10.3*  HCT 33.9* 36.8 33.3* 30.5*  MCV 96.0 94.4 99.1 99.0  PLT 205 200 170 158   Recent Labs  Lab 07/29/22 1116 07/29/22 1519 07/29/22 2119 07/30/22 0551 07/30/22 1136  GLUCAP 217* 230* 191* 166* 241*      Component Value Date/Time   COLORURINE STRAW (A) 07/23/2022 1906   APPEARANCEUR CLEAR 07/23/2022 1906   LABSPEC 1.005 07/23/2022 1906   PHURINE 7.0 07/23/2022 1906   GLUCOSEU NEGATIVE 07/23/2022 1906   HGBUR SMALL (A) 07/23/2022 1906   BILIRUBINUR NEGATIVE 07/23/2022 1906   KETONESUR NEGATIVE 07/23/2022 1906   PROTEINUR 30 (A)  07/23/2022 1906   UROBILINOGEN 0.2 06/23/2014 1140   NITRITE NEGATIVE 07/23/2022 1906   LEUKOCYTESUR NEGATIVE 07/23/2022 1906   Sepsis Labs Recent Labs  Lab 07/23/22 1725 07/24/22 1046 07/27/22 0055 07/28/22 0034  WBC 7.8 7.4 6.7 7.9   Microbiology Recent Results (from the past 240 hour(s))  SARS Coronavirus 2 by RT PCR (hospital order, performed in [facility] hospital lab) *cepheid single result test* Anterior Nasal Swab     Status: None   Collection Time: 07/23/22  7:05 PM   Specimen: Anterior Nasal Swab  Result Value Ref Range Status   SARS Coronavirus 2 by RT PCR NEGATIVE NEGATIVE Final    Comment: Performed at [facility], [address], [place], [place] [address]    Time coordinating discharge: 35 minutes  SIGNED: Lanae Boast, MD  [facility] 07/30/2022, 12:41 PM  If 7PM-7AM, please contact  night-coverage [email or web address]

## 2022-07-30 NOTE — Progress Notes (Signed)
Mobility Specialist Progress Note:    07/30/22 1204  Mobility  Activity Ambulated with assistance in hallway  Level of Assistance Minimal assist, patient does 75% or more  Assistive Device Front wheel walker  Distance Ambulated (ft) 126 ft  Activity Response Tolerated well  Mobility Referral Yes  $Mobility charge 1 Mobility  Mobility Specialist Start Time (ACUTE ONLY) 1033  Mobility Specialist Stop Time (ACUTE ONLY) 1050  Mobility Specialist Time Calculation (min) (ACUTE ONLY) 17 min   Pt received in bed, eager to ambulate. Pt needed minA for bed mobility and STS, contact guard for ambulation. Upon standing at bedside pt had an episode of urinary incontinence, assisted with pericare. No c/o throughout session. Assisted to chair post session. Call bell in hand and all needs met.   Thompson Grayer Mobility Specialist  Please contact vis Secure Chat or  Rehab Office [phone]

## 2022-07-30 NOTE — Plan of Care (Signed)
  Problem: Education: Goal: Knowledge of General Education information will improve Description: Including pain rating scale, medication(s)/side effects and non-pharmacologic comfort measures Outcome: Adequate for Discharge   Problem: Health Behavior/Discharge Planning: Goal: Ability to manage health-related needs will improve Outcome: Adequate for Discharge   Problem: Clinical Measurements: Goal: Ability to maintain clinical measurements within normal limits will improve Outcome: Adequate for Discharge Goal: Will remain free from infection Outcome: Adequate for Discharge Goal: Diagnostic test results will improve Outcome: Adequate for Discharge Goal: Respiratory complications will improve Outcome: Adequate for Discharge Goal: Cardiovascular complication will be avoided Outcome: Adequate for Discharge   Problem: Activity: Goal: Risk for activity intolerance will decrease Outcome: Adequate for Discharge   Problem: Nutrition: Goal: Adequate nutrition will be maintained Outcome: Adequate for Discharge   Problem: Coping: Goal: Level of anxiety will decrease Outcome: Adequate for Discharge   Problem: Elimination: Goal: Will not experience complications related to bowel motility Outcome: Adequate for Discharge Goal: Will not experience complications related to urinary retention Outcome: Adequate for Discharge   Problem: Pain Managment: Goal: General experience of comfort will improve Outcome: Adequate for Discharge   Problem: Safety: Goal: Ability to remain free from injury will improve Outcome: Adequate for Discharge   Problem: Skin Integrity: Goal: Risk for impaired skin integrity will decrease Outcome: Adequate for Discharge   Problem: Education: Goal: Ability to demonstrate management of disease process will improve Outcome: Adequate for Discharge Goal: Ability to verbalize understanding of medication therapies will improve Outcome: Adequate for Discharge Goal:  Individualized Educational Video(s) Outcome: Adequate for Discharge   Problem: Activity: Goal: Capacity to carry out activities will improve Outcome: Adequate for Discharge   Problem: Cardiac: Goal: Ability to achieve and maintain adequate cardiopulmonary perfusion will improve Outcome: Adequate for Discharge   Problem: Education: Goal: Ability to describe self-care measures that may prevent or decrease complications (Diabetes Survival Skills Education) will improve Outcome: Adequate for Discharge Goal: Individualized Educational Video(s) Outcome: Adequate for Discharge   Problem: Coping: Goal: Ability to adjust to condition or change in health will improve Outcome: Adequate for Discharge   Problem: Fluid Volume: Goal: Ability to maintain a balanced intake and output will improve Outcome: Adequate for Discharge   Problem: Health Behavior/Discharge Planning: Goal: Ability to identify and utilize available resources and services will improve Outcome: Adequate for Discharge Goal: Ability to manage health-related needs will improve Outcome: Adequate for Discharge   Problem: Metabolic: Goal: Ability to maintain appropriate glucose levels will improve Outcome: Adequate for Discharge   Problem: Nutritional: Goal: Maintenance of adequate nutrition will improve Outcome: Adequate for Discharge Goal: Progress toward achieving an optimal weight will improve Outcome: Adequate for Discharge   Problem: Skin Integrity: Goal: Risk for impaired skin integrity will decrease Outcome: Adequate for Discharge   Problem: Tissue Perfusion: Goal: Adequacy of tissue perfusion will improve Outcome: Adequate for Discharge   

## 2022-07-30 NOTE — TOC Progression Note (Signed)
Transition of Care [facility]) - Progression Note    Patient Details  Name: Allison Duffy MRN: [id number] Date of Birth: 09-06-1926  Transition of Care [facility]) CM/SW Contact  Leander Rams, [name] Phone Number: 07/30/2022, 11:14 AM  Clinical Narrative:    CSW was informed that pt is not medical stable to dc to SNF. Pt will dc to [facility] when medically stable.   TOC will continue to follow.    Expected Discharge Plan: Skilled Nursing Facility Barriers to Discharge: Continued Medical Work up  Expected Discharge Plan and Services       Living arrangements for the past 2 months: Single Family Home                                       Social Determinants of Health (SDOH) Interventions SDOH Screenings   Food Insecurity: No Food Insecurity (07/24/2022)  Housing: Patient Declined (07/24/2022)  Transportation Needs: No Transportation Needs (07/24/2022)  Utilities: Not At Risk (07/24/2022)  Depression (PHQ2-9): Low Risk  (04/30/2021)  Tobacco Use: Medium Risk (07/23/2022)    Readmission Risk Interventions     No data to display         Oletta Lamas, MSW, LCSWA, LCASA Transitions of Care  Clinical Social Worker I

## 2022-07-30 NOTE — Progress Notes (Signed)
Report called to [facility] followed, questions asked answered.

## 2022-07-30 NOTE — TOC Transition Note (Signed)
Transition of Care [facility]) - CM/SW Discharge Note   Patient Details  Name: Allison Duffy MRN: [id number] Date of Birth: February 04, 1926  Transition of Care [facility]) CM/SW Contact:  Delilah Shan, LCSWA Phone Number: 07/30/2022, 2:06 PM   Clinical Narrative:     Patient will DC to: [facility]  Anticipated DC date: 07/30/2022  Family notified: [name]  Transport by: Sharin Mons  ?  Per MD patient ready for DC to [facility] . RN, patient, patient's family, and facility notified of DC. Discharge Summary sent to facility. RN given number for report tele# [phone] RM# [phone]. DC packet on chart.DNR signed by MD attached to patients DC packet. Ambulance transport requested for patient.  CSW signing off.   Final next level of care: Skilled Nursing Facility Barriers to Discharge: No Barriers Identified   Patient Goals and CMS Choice   Choice offered to / list presented to : Patient, Adult Children  Discharge Placement                Patient chooses bed at: [facility] [place] Patient to be transferred to facility by: [facility] Name of family member notified: [name] Patient and family notified of of transfer: 07/30/22  Discharge Plan and Services Additional resources added to the After Visit Summary for                                       Social Determinants of Health (SDOH) Interventions SDOH Screenings   Food Insecurity: No Food Insecurity (07/24/2022)  Housing: Patient Declined (07/24/2022)  Transportation Needs: No Transportation Needs (07/24/2022)  Utilities: Not At Risk (07/24/2022)  Depression (PHQ2-9): Low Risk  (04/30/2021)  Tobacco Use: Medium Risk (07/23/2022)     Readmission Risk Interventions     No data to display

## 2022-07-31 DIAGNOSIS — Z66 Do not resuscitate: Secondary | ICD-10-CM | POA: Diagnosis not present

## 2022-07-31 DIAGNOSIS — I5030 Unspecified diastolic (congestive) heart failure: Secondary | ICD-10-CM | POA: Diagnosis not present

## 2022-07-31 DIAGNOSIS — I480 Paroxysmal atrial fibrillation: Secondary | ICD-10-CM | POA: Diagnosis not present

## 2022-07-31 DIAGNOSIS — I2510 Atherosclerotic heart disease of native coronary artery without angina pectoris: Secondary | ICD-10-CM | POA: Diagnosis not present

## 2022-08-01 NOTE — Progress Notes (Unsigned)
Cardiology Clinic Note   Patient Name: Allison Duffy Date of Encounter: 08/04/2022  Primary Care Provider:  Geoffry Paradise, MD Primary Cardiologist:  Nanetta Batty, MD  Patient Profile    Allison Duffy 89 year old female presents the clinic today for follow-up evaluation of her hypertension and acute on chronic diastolic CHF.  Past Medical History    Past Medical History:  Diagnosis Date   Acute on chronic combined systolic and diastolic CHF (congestive heart failure) (HCC) 01/28/2019   Arthritis    "scattered joints" (07/29/2013)   Chest pain    Colitis    Gout attack ?1980's   Hyperlipidemia    Hypertension    PONV (postoperative nausea and vomiting)    Type II diabetes mellitus (HCC)    Past Surgical History:  Procedure Laterality Date   APPENDECTOMY     CARPAL TUNNEL RELEASE Right    CATARACT EXTRACTION W/ INTRAOCULAR LENS  IMPLANT, BILATERAL Bilateral    CHOLECYSTECTOMY     KNEE ARTHROSCOPY Right    LEFT HEART CATH AND CORONARY ANGIOGRAPHY N/A 04/12/2020   Procedure: LEFT HEART CATH AND CORONARY ANGIOGRAPHY;  Surgeon: Runell Gess, MD;  Location: [facility] INVASIVE CV LAB;  Service: Cardiovascular;  Laterality: N/A;   OLECRANON BURSECTOMY Left 07/29/2013   "in ER"   PARATHYROIDECTOMY     TONSILLECTOMY     TOTAL SHOULDER REPLACEMENT  2017   VAGINAL HYSTERECTOMY      Allergies  Allergies  Allergen Reactions   Codeine Nausea And Vomiting   Hydrocodone     Nausea and vomiting   Prednisone     Other reaction(s): sick on her stomach   Sulfa Antibiotics Itching, Nausea Only and Other (See Comments)    History of Present Illness    Allison Duffy has a PMH of HTN, CAD, HLD, paroxysmal atrial fibrillation, mitral regurgitation, unstable angina, pulmonary nodule, colitis, type 2 diabetes, atypical chest pain, and hypokalemia.  Echocardiogram 07/24/2022 showed an LVEF of 60-65%, G1 DD, mild mitral valve regurgitation, moderately dilated left atria, and mild  calcification of the aortic valve with no evidence of aortic valve stenosis.  She was seen in the emergency department on 6/27 and was discharged on 07/30/2022.  She complained of chest discomfort that was intermittent, and weakness.  Her high-sensitivity troponins were normal.  Her EKG showed normal sinus rhythm.  Due to her history she was admitted.  Cardiology was consulted and recommended blood pressure control and medical management.  She was noted to have a heart rate in the 30s-40s and her carvedilol was discontinued.  Her heart rate stabilized.  Her blood pressure medication was adjusted and she was placed on amlodipine.  Her creatinine was noted to be downtrending.  She was noted to be deconditioned.  She was discharged to SNF.  She presents to the clinic today for follow-up evaluation and states she is doing physical therapy and Occupational Therapy daily.  She is enjoying the food claps.  She presents with her daughter.  We reviewed her recent hospitalization and medication changes.  Prior to her admission she was on furosemide 40 mg daily with 20 mill equivalents of potassium.  Her blood pressure is well-controlled at 110/58.  Her pulse today is 52.  We reviewed the importance of low-sodium diet.  She indicates that her lower extremity swelling is no longer gone when she wakes up in the morning.  I will prescribe furosemide 20 mg daily, 10 mill equivalents of potassium, repeat a BMP in  1 week and plan follow-up in 2 to 3 weeks.  Today she denies chest pain, shortness of breath, fatigue, palpitations, melena, hematuria, hemoptysis, diaphoresis, weakness, presyncope, syncope, orthopnea, and PND.    Home Medications    Prior to Admission medications   Medication Sig Start Date End Date Taking? Authorizing Provider  amLODipine (NORVASC) 10 MG tablet Take 10 mg by mouth at bedtime.    [provider]  apixaban (ELIQUIS) 2.5 MG TABS tablet Take 1 tablet (2.5 mg total) by mouth 2 (two)  times daily. 07/30/22   Lanae Boast, MD  carboxymethylcellulose 1 % ophthalmic solution Place 1 drop into both eyes 2 (two) times daily.    [provider]  cholecalciferol (VITAMIN D) 1000 UNITS tablet Take 1,000 Units by mouth daily with lunch.    [provider]  cholestyramine (QUESTRAN) 4 g packet Take 4 g by mouth daily as needed (IBS).    [provider]  cloNIDine (CATAPRES) 0.1 MG tablet TAKE 2 TABLETS BY MOUTH EVERY MORNING AND TAKE 1 TABLET BY MOUTH EVERY NIGHT AT BEDTIME 07/28/22   Runell Gess, MD  isosorbide-hydrALAZINE (BIDIL) 20-37.5 MG tablet Take 1 tablet by mouth 3 (three) times daily. 07/30/22   Lanae Boast, MD  Lancets [facility] ULTRASOFT) lancets use to check blood sugar twice a day as directed 12/18/11   [provider]  Multiple Vitamin (MULTIVITAMIN WITH MINERALS) TABS Take 1 tablet by mouth daily with supper.    [provider]  nitroGLYCERIN (NITROSTAT) 0.4 MG SL tablet PLACE 1 TABLET (0.4 MG TOTAL) UNDER THE TONGUE EVERY FIVE MINUTES X 3 DOSES AS NEEDED FOR CHEST PAIN. Patient taking differently: Place 0.4 mg under the tongue every 5 (five) minutes as needed for chest pain. 04/13/20 07/26/22  Marykay Lex, MD  olmesartan (BENICAR) 40 MG tablet Take 40 mg by mouth at bedtime.    Geoffry Paradise, MD  omeprazole (PRILOSEC) 40 MG capsule TAKE 1 CAPSULE BY MOUTH DAILY WITH SUPPER Patient taking differently: Take 40 mg by mouth daily. 01/15/22   Runell Gess, MD  [facility] ULTRA test strip 1 each by Other route as needed for other. 06/29/19   [provider]  Probiotic Product (PRO-BIOTIC BLEND) CAPS Take 1 capsule by mouth at bedtime. Takes OTC probiotic    Geoffry Paradise, MD  ranolazine (RANEXA) 500 MG 12 hr tablet TAKE 1 TABLET(500 MG) BY MOUTH TWICE DAILY Patient taking differently: Take 500 mg by mouth 2 (two) times daily. 01/15/22   Runell Gess, MD  rosuvastatin (CRESTOR) 20 MG tablet TAKE 1 TABLET(20 MG) BY  MOUTH AT BEDTIME Patient taking differently: Take 20 mg by mouth daily. 03/10/22   Runell Gess, MD  sitaGLIPtin (JANUVIA) 100 MG tablet Take 100 mg by mouth daily with lunch.    [provider]    Family History    Family History  Problem Relation Age of Onset   Hypertension Father    Heart disease Father    Heart disease Mother    Diabetes Mother    Hypertension Mother    Heart disease Brother        both brothers   Diabetes Brother    Hypertension Brother    She indicated that her mother is deceased. She indicated that her father is deceased.  Social History    Social History   Socioeconomic History   Marital status: Widowed    Spouse name: 4 children and 3 living   Number of children:  4   Years of education: Not on file   Highest education level: Not on file  Occupational History   Occupation: Retired  Tobacco Use   Smoking status: Former    Years: 5    Types: Cigarettes    Quit date: 07/05/1953    Years since quitting: 69.1   Smokeless tobacco: Never  Vaping Use   Vaping Use: Never used  Substance and Sexual Activity   Alcohol use: No   Drug use: No   Sexual activity: Not Currently  Other Topics Concern   Not on file  Social History Narrative   Lives independently in her own home. Daughter, Allison Duffy, is 5 min. away and her grandson lives next door.   Social Determinants of Health   Financial Resource Strain: Not on file  Food Insecurity: No Food Insecurity (07/24/2022)   Hunger Vital Sign    Worried About Running Out of Food in the Last Year: Never true    Ran Out of Food in the Last Year: Never true  Transportation Needs: No Transportation Needs (07/24/2022)   PRAPARE - Administrator, Civil Service (Medical): No    Lack of Transportation (Non-Medical): No  Physical Activity: Not on file  Stress: Not on file  Social Connections: Not on file  Intimate Partner Violence: Not At Risk (07/24/2022)   Humiliation, Afraid, Rape, and  Kick questionnaire    Fear of Current or Ex-Partner: No    Emotionally Abused: No    Physically Abused: No    Sexually Abused: No     Review of Systems    General:  No chills, fever, night sweats or weight changes.  Cardiovascular:  No chest pain, dyspnea on exertion, edema, orthopnea, palpitations, paroxysmal nocturnal dyspnea. Dermatological: No rash, lesions/masses Respiratory: No cough, dyspnea Urologic: No hematuria, dysuria Abdominal:   No nausea, vomiting, diarrhea, bright red blood per rectum, melena, or hematemesis Neurologic:  No visual changes, wkns, changes in mental status. All other systems reviewed and are otherwise negative except as noted above.  Physical Exam    VS:  BP (!) 110/58   Pulse (!) 52   Ht 5\' 5"  (1.651 m)   Wt 155 lb 3.2 oz (70.4 kg)   SpO2 97%   BMI 25.83 kg/m  , BMI Body mass index is 25.83 kg/m. GEN: Well nourished, well developed, in no acute distress. HEENT: normal. Neck: Supple, no JVD, carotid bruits, or masses. Cardiac: RRR, no murmurs, rubs, or gallops. No clubbing, cyanosis, 2+ pitting ankle edema.  Radials/DP/PT 2+ and equal bilaterally.  Respiratory:  Respirations regular and unlabored, clear to auscultation bilaterally. GI: Soft, nontender, nondistended, BS + x 4. MS: no deformity or atrophy. Skin: warm and dry, no rash. Neuro:  Strength and sensation are intact. Psych: Normal affect.  Accessory Clinical Findings    Recent Labs: 03/19/2022: ALT 24 07/23/2022: B Natriuretic Peptide 191.7 07/25/2022: Magnesium 2.0 07/28/2022: Hemoglobin 10.3; Platelets 158 07/30/2022: BUN 27; Creatinine, Ser 1.54; Potassium 3.9; Sodium 132   Recent Lipid Panel    Component Value Date/Time   CHOL 109 07/26/2022 0054   TRIG 52 07/26/2022 0054   HDL 57 07/26/2022 0054   CHOLHDL 1.9 07/26/2022 0054   VLDL 10 07/26/2022 0054   LDLCALC 42 07/26/2022 0054         ECG personally reviewed by me today-none today.  Echocardiogram  07/24/2022 IMPRESSIONS     1. Left ventricular ejection fraction, by estimation, is 60 to 65%. The  left ventricle  has normal function. The left ventricle has no regional  wall motion abnormalities. There is moderate basal septal hypertrophy. The  rest of the LV segments demonstrate   mild concentric left ventricular hypertrophy of the basal-septal segment.  Left ventricular diastolic parameters are consistent with Grade II  diastolic dysfunction (pseudonormalization).   2. Right ventricular systolic function is normal. The right ventricular  size is normal.   3. Left atrial size was moderately dilated.   4. The mitral valve is grossly normal. Mild mitral valve regurgitation.   5. The aortic valve is tricuspid. There is mild calcification of the  aortic valve. There is mild thickening of the aortic valve. Aortic valve  regurgitation is not visualized. Aortic valve sclerosis/calcification is  present, without any evidence of  aortic stenosis.   6. The inferior vena cava is normal in size with greater than 50%  respiratory variability, suggesting right atrial pressure of 3 mmHg.   Comparison(s): No significant change from prior study.     Assessment & Plan   1.  Chest discomfort-no chest pain today.  During ED visit it was felt that her chest discomfort was related to her elevated blood pressure.  Atypical in nature.  Denies further episodes of arm neck back or chest pain.  Cardiac catheterization 2022 showed nonobstructive CAD. Continue Ranexa, BiDil, amlodipine Heart healthy low-sodium diet  Bradycardia-heart rate today 52 bpm.  Denies lightheadedness, presyncope or syncope.  Carvedilol was discontinued during recent admission. Continue amlodipine Avoid AV nodal blocking agents  Acute on chronic diastolic CHF-weight 155.2 pounds.  Bilateral 2+ pitting ankle edema.  Not a candidate for Entresto due to renal function. Continue BiDil Daily weights Heart healthy low-sodium  diet Elevate lower extremities when not active Start furosemide 20 mg daily Start potassium 10 mill equivalents daily Be met in 1 week  History of atrial fibrillation-denies episodes of accelerated or irregular heartbeat.  Reports compliance with apixaban.  Denies bleeding issues.  Carvedilol discontinued due to bradycardia Continue apixaban Avoid triggers caffeine, chocolate, EtOH, dehydration etc.  DOE-breathing has slightly improved.  Previously felt to be secondary to uncontrolled hypertension.  Also feel that deconditioning and body habitus are contributing.  Echocardiogram 07/24/2022 showed normal LVEF and G2 DD with mild mitral valve regurgitation. Continue current medical therapy Increase physical activity as tolerated  Disposition: Follow-up with Dr.Berry or me in 2 to 3 weeks  Julizza Sassone M. Eliav Mechling NP-C     08/04/2022, 11:45 AM [facility] [address] Office [phone] Fax [phone]    I spent 14 minutes examining this patient, reviewing medications, and using patient centered shared decision making involving her cardiac care.  Prior to her visit I spent greater than 20 minutes reviewing her past medical history,  medications, and prior cardiac tests.

## 2022-08-04 ENCOUNTER — Encounter: Payer: Self-pay | Admitting: General Practice

## 2022-08-04 ENCOUNTER — Ambulatory Visit (INDEPENDENT_AMBULATORY_CARE_PROVIDER_SITE_OTHER): Payer: Medicare Other | Admitting: General Practice

## 2022-08-04 VITALS — BP 110/58 | HR 52 | Ht 65.0 in | Wt 155.2 lb

## 2022-08-04 DIAGNOSIS — R001 Bradycardia, unspecified: Secondary | ICD-10-CM | POA: Diagnosis not present

## 2022-08-04 DIAGNOSIS — R0789 Other chest pain: Secondary | ICD-10-CM

## 2022-08-04 DIAGNOSIS — I480 Paroxysmal atrial fibrillation: Secondary | ICD-10-CM

## 2022-08-04 DIAGNOSIS — R0609 Other forms of dyspnea: Secondary | ICD-10-CM

## 2022-08-04 DIAGNOSIS — I5033 Acute on chronic diastolic (congestive) heart failure: Secondary | ICD-10-CM

## 2022-08-04 MED ORDER — POTASSIUM CHLORIDE ER 10 MEQ PO TBCR: 10.0000 meq | EXTENDED_RELEASE_TABLET | Freq: Every day | ORAL | 3 refills | Status: DC

## 2022-08-04 MED ORDER — FUROSEMIDE 20 MG PO TABS: 20.0000 mg | ORAL_TABLET | Freq: Every day | ORAL | 3 refills | Status: DC

## 2022-08-04 NOTE — Patient Instructions (Signed)
Medication Instructions:  START FUROSEMIDE 20MG  DAILY START POTASSIUM DAILY *If you need a refill on your cardiac medications before your next appointment, please call your pharmacy*  Lab Work: BMET IN 1 WEEK If you have labs (blood work) drawn today and your tests are completely normal, you will receive your results only by:  MyChart Message (if you have MyChart) OR  A paper copy in the mail If you have any lab test that is abnormal or we need to change your treatment, we will call you to review the results.  Other Instructions PLEASE READ AND FOLLOW ATTACHED  SALTY 6  Follow-Up: At [facility], you and your health needs are our priority.  As part of our continuing mission to provide you with exceptional heart care, we have created designated Provider Care Teams.  These Care Teams include your primary Cardiologist (physician) and Advanced Practice Providers (APPs -  Physician Assistants and Nurse Practitioners) who all work together to provide you with the care you need, when you need it.  Your next appointment:   2-3 week(s)  Provider:   Edd Fabian, FNP-C

## 2022-08-05 ENCOUNTER — Other Ambulatory Visit: Payer: Self-pay | Admitting: Cardiovascular Disease

## 2022-08-05 NOTE — Telephone Encounter (Signed)
Pt last saw Edd Fabian, NP on 08/04/22, last labs 07/30/22 Creat 1.54, age 89, weight 70.4kg, based on most recent labwork pt is not on appropriate dosage of Eliquis. Pt has an order in Epic to repeat BMP in 1 week, last labwork from recent hospitalization.Will have pharmacist review and assess current Eliquis dosage.

## 2022-08-05 NOTE — Telephone Encounter (Signed)
SCr prior to recent admission was < 1.5 consistently. She was on IV diuretics during her recent admission which likely increased her SCr. Ok to fill the 5mg  BID, would put 0 refills though and can trend labs at 7/31 appt with Verdon Cummins - I added a note to recheck BMET then.

## 2022-08-13 ENCOUNTER — Other Ambulatory Visit: Payer: Self-pay | Admitting: *Deleted

## 2022-08-13 NOTE — Patient Outreach (Signed)
Allison Duffy resides in [facility] skilled nursing facility. Screening for potential care coordination services as benefit of health plan and Primary Care Provider.   Update received from [facility] Child psychotherapist. Allison Duffy is from home alone. Has supportive family. Will have [facility] home health for PT/OT. Allison Duffy (daughter) is primary contact.   Telephone call made to Allison Duffy (daughter) [phone]. Patient identifiers confirmed. Allison Duffy states Allison Duffy lives alone. Son and daughter in law live next door. Allison Duffy reports she takes Allison Duffy to MD appointments. Allison Duffy to schedule PCP appointment upon SNF discharge. Allison Duffy is agreeable.   Allison Duffy inquires about mobile meals programs. Requests referral for mobile meals assistance. Allison Duffy also reports she is looking into possible long term care options such as independent living. Waiting to find out about the costs. States Allison Duffy likely not going to want to leave her home however. Allison Duffy agreeable to [facility] Social Worker referral. Allison Duffy also reports Allison Duffy has experienced a lot of loss as of late. States husband's anniversary of his passing was recently. Also states Allison Duffy's good friend recently passed. Also states Allison Duffy other daughter was recently diagnosed with cancer. Allison Duffy could benefit from referral for Child psychotherapist for grief counseling.  Discussed life alert system. Allison Duffy states she will look into it. Confirmed Allison Duffy's mobile number is [phone]. Writer attempted to reach Allison Duffy at [phone]. No answer. HIPAA compliant voicemail message left requesting return call.   Will continue to follow and plan referrals upon SNF discharge. Email sent to [facility] with writer's contact information and [facility] Care Management brochure.    Allison Noble, MSN, RN,BSN [facility] Post Acute Care Coordinator [phone] (Direct dial)

## 2022-08-20 ENCOUNTER — Other Ambulatory Visit: Payer: Self-pay | Admitting: *Deleted

## 2022-08-20 NOTE — Patient Outreach (Incomplete)
Per [facility] Allison Duffy resides in [facility] skilled nursing facility. Screening for potential care coordination services as benefit of health plan and Primary Care Provider.

## 2022-08-21 ENCOUNTER — Other Ambulatory Visit: Payer: Self-pay | Admitting: *Deleted

## 2022-08-21 NOTE — Patient Outreach (Signed)
Post-Acute Care Coordinator follow up. Mrs. Allison Duffy resides in [facility] skilled nursing facility. Screened for care coordination services as benefit of health plan and Primary Care Provider.   Update received from Jill Side, Programmer, systems. Mrs. Allison Duffy is slated to return home on Friday, 08/22/22 with daughter's support. Will have [facility] for PT/OT/Nursing.   Telephone call made to Mrs. Allison Duffy on her cell phone at [phone]. Patient identifiers confirmed. Mrs. Allison Duffy confirms she is returning home tomorrow. States she is looking forward to it. Explained [facility] care coordination services. Mrs. Allison Duffy is agreeable. Explained care coordination services will not interfere or replace home health.   Mrs. Allison Duffy confirms best contact number for her is her cell number at [phone].  Will refer upon SNF discharge.   Raiford Noble, MSN, RN,BSN [facility] Post Acute Care Coordinator [phone] (Direct dial)

## 2022-08-21 NOTE — Patient Outreach (Addendum)
Per [facility] Allison Duffy resides in [facility] skilled nursing facility. Screening for potential care coordination services as benefit of health plan and Primary Care Provider.   Previously spoke with Allison Duffy, Allison Duffy, about [facility] care coordination services. Secure message sent to Allison Duffy social worker to inquire about transition plans/date.   Will follow.   Raiford Noble, MSN, RN,BSN [facility] Post Acute Care Coordinator [phone] (Direct dial)

## 2022-08-22 ENCOUNTER — Telehealth: Payer: Self-pay | Admitting: Cardiovascular Disease

## 2022-08-22 NOTE — Telephone Encounter (Signed)
Pt c/o medication issue:  1. Name of Medication:  isosorbide-hydrALAZINE (BIDIL) 20-37.5 MG tablet  2. How are you currently taking this medication (dosage and times per day)?   3. Are you having a reaction (difficulty breathing--STAT)?   4. What is your medication issue?   Patient's daughter states this medication will cost $300 and insurance does not cover it. Daughter would like to discuss alternate options.

## 2022-08-22 NOTE — Telephone Encounter (Signed)
Patient daughter states needs new medication or alternative.  She will have enough until her appt.  She states "feels the best she ever has" and they would like to keep her on this if there is any assistance for it. Please advise

## 2022-08-24 DIAGNOSIS — I700 Atherosclerosis of aorta: Secondary | ICD-10-CM | POA: Diagnosis not present

## 2022-08-24 DIAGNOSIS — Z9049 Acquired absence of other specified parts of digestive tract: Secondary | ICD-10-CM | POA: Diagnosis not present

## 2022-08-24 DIAGNOSIS — Z9181 History of falling: Secondary | ICD-10-CM | POA: Diagnosis not present

## 2022-08-24 DIAGNOSIS — I4891 Unspecified atrial fibrillation: Secondary | ICD-10-CM | POA: Diagnosis not present

## 2022-08-24 DIAGNOSIS — E1165 Type 2 diabetes mellitus with hyperglycemia: Secondary | ICD-10-CM | POA: Diagnosis not present

## 2022-08-24 DIAGNOSIS — H04129 Dry eye syndrome of unspecified lacrimal gland: Secondary | ICD-10-CM | POA: Diagnosis not present

## 2022-08-24 DIAGNOSIS — I2510 Atherosclerotic heart disease of native coronary artery without angina pectoris: Secondary | ICD-10-CM | POA: Diagnosis not present

## 2022-08-24 DIAGNOSIS — I081 Rheumatic disorders of both mitral and tricuspid valves: Secondary | ICD-10-CM | POA: Diagnosis not present

## 2022-08-24 DIAGNOSIS — Z7984 Long term (current) use of oral hypoglycemic drugs: Secondary | ICD-10-CM | POA: Diagnosis not present

## 2022-08-24 DIAGNOSIS — E559 Vitamin D deficiency, unspecified: Secondary | ICD-10-CM | POA: Diagnosis not present

## 2022-08-24 DIAGNOSIS — Z8701 Personal history of pneumonia (recurrent): Secondary | ICD-10-CM | POA: Diagnosis not present

## 2022-08-24 DIAGNOSIS — I5033 Acute on chronic diastolic (congestive) heart failure: Secondary | ICD-10-CM | POA: Diagnosis not present

## 2022-08-24 DIAGNOSIS — J9601 Acute respiratory failure with hypoxia: Secondary | ICD-10-CM | POA: Diagnosis not present

## 2022-08-24 DIAGNOSIS — Z556 Problems related to health literacy: Secondary | ICD-10-CM | POA: Diagnosis not present

## 2022-08-24 DIAGNOSIS — I5022 Chronic systolic (congestive) heart failure: Secondary | ICD-10-CM | POA: Diagnosis not present

## 2022-08-24 DIAGNOSIS — M6281 Muscle weakness (generalized): Secondary | ICD-10-CM | POA: Diagnosis not present

## 2022-08-24 DIAGNOSIS — E785 Hyperlipidemia, unspecified: Secondary | ICD-10-CM | POA: Diagnosis not present

## 2022-08-24 DIAGNOSIS — Z96619 Presence of unspecified artificial shoulder joint: Secondary | ICD-10-CM | POA: Diagnosis not present

## 2022-08-24 DIAGNOSIS — N179 Acute kidney failure, unspecified: Secondary | ICD-10-CM | POA: Diagnosis not present

## 2022-08-24 DIAGNOSIS — K589 Irritable bowel syndrome without diarrhea: Secondary | ICD-10-CM | POA: Diagnosis not present

## 2022-08-24 DIAGNOSIS — Z7901 Long term (current) use of anticoagulants: Secondary | ICD-10-CM | POA: Diagnosis not present

## 2022-08-24 DIAGNOSIS — I4892 Unspecified atrial flutter: Secondary | ICD-10-CM | POA: Diagnosis not present

## 2022-08-24 DIAGNOSIS — J984 Other disorders of lung: Secondary | ICD-10-CM | POA: Diagnosis not present

## 2022-08-24 DIAGNOSIS — I110 Hypertensive heart disease with heart failure: Secondary | ICD-10-CM | POA: Diagnosis not present

## 2022-08-25 ENCOUNTER — Other Ambulatory Visit: Payer: Self-pay | Admitting: *Deleted

## 2022-08-25 DIAGNOSIS — J9601 Acute respiratory failure with hypoxia: Secondary | ICD-10-CM | POA: Diagnosis not present

## 2022-08-25 DIAGNOSIS — I5042 Chronic combined systolic (congestive) and diastolic (congestive) heart failure: Secondary | ICD-10-CM

## 2022-08-25 DIAGNOSIS — I5022 Chronic systolic (congestive) heart failure: Secondary | ICD-10-CM | POA: Diagnosis not present

## 2022-08-25 DIAGNOSIS — I110 Hypertensive heart disease with heart failure: Secondary | ICD-10-CM | POA: Diagnosis not present

## 2022-08-25 DIAGNOSIS — I2510 Atherosclerotic heart disease of native coronary artery without angina pectoris: Secondary | ICD-10-CM | POA: Diagnosis not present

## 2022-08-25 DIAGNOSIS — I5033 Acute on chronic diastolic (congestive) heart failure: Secondary | ICD-10-CM | POA: Diagnosis not present

## 2022-08-25 DIAGNOSIS — E1165 Type 2 diabetes mellitus with hyperglycemia: Secondary | ICD-10-CM | POA: Diagnosis not present

## 2022-08-25 MED ORDER — ISOSORBIDE DINITRATE 20 MG PO TABS: 20.0000 mg | ORAL_TABLET | Freq: Three times a day (TID) | ORAL | 3 refills | Status: DC

## 2022-08-25 MED ORDER — HYDRALAZINE HCL 25 MG PO TABS: ORAL_TABLET | ORAL | 3 refills | Status: DC

## 2022-08-25 NOTE — Patient Outreach (Addendum)
Per [facility] Mrs. Allison Duffy discharged from [facility] skilled nursing facility on Friday, 08/22/22. Per [facility] is providing [facility] health services.  Mrs. Allison Duffy previously agreed to [facility] care coordination services. Please see writer's notes from 08/21/22.   Telephone call made to Mrs. Allison Duffy. Patient identifiers confirmed. Allison Duffy endorses she is now at home. States [facility] has been out twice. Remains agreeable to [facility] follow up. Discussed mobile meals referral. Allison Duffy declines the need for mobile meals.  Referral made for [facility] RN for care coordination services  Allison Duffy confirmed best contact number for her is her cell number at [phone] (not listed in chart).  Raiford Noble, MSN, RN,BSN [facility] Post Acute Care Coordinator [phone] (Direct dial)

## 2022-08-25 NOTE — Progress Notes (Unsigned)
Cardiology Clinic Note   Patient Name: Allison Duffy Date of Encounter: 08/27/2022  Primary Care Provider:  Geoffry Paradise, MD Primary Cardiologist:  Nanetta Batty, MD  Patient Profile    Allison Duffy 89 year old female presents the clinic today for follow-up evaluation of her hypertension and acute on chronic diastolic CHF.  Past Medical History    Past Medical History:  Diagnosis Date   Acute on chronic combined systolic and diastolic CHF (congestive heart failure) (HCC) 01/28/2019   Arthritis    "scattered joints" (07/29/2013)   Chest pain    Colitis    Gout attack ?1980's   Hyperlipidemia    Hypertension    PONV (postoperative nausea and vomiting)    Type II diabetes mellitus (HCC)    Past Surgical History:  Procedure Laterality Date   APPENDECTOMY     CARPAL TUNNEL RELEASE Right    CATARACT EXTRACTION W/ INTRAOCULAR LENS  IMPLANT, BILATERAL Bilateral    CHOLECYSTECTOMY     KNEE ARTHROSCOPY Right    LEFT HEART CATH AND CORONARY ANGIOGRAPHY N/A 04/12/2020   Procedure: LEFT HEART CATH AND CORONARY ANGIOGRAPHY;  Surgeon: Runell Gess, MD;  Location: [facility];  Service: Cardiovascular;  Laterality: N/A;   OLECRANON BURSECTOMY Left 07/29/2013   "in ER"   PARATHYROIDECTOMY     TONSILLECTOMY     TOTAL SHOULDER REPLACEMENT  2017   VAGINAL HYSTERECTOMY      Allergies  Allergies  Allergen Reactions   Codeine Nausea And Vomiting   Hydrocodone     Nausea and vomiting   Prednisone     Other reaction(s): sick on her stomach   Sulfa Antibiotics Itching, Nausea Only and Other (See Comments)    History of Present Illness    Allison Duffy has a PMH of HTN, CAD, HLD, paroxysmal atrial fibrillation, mitral regurgitation, unstable angina, pulmonary nodule, colitis, type 2 diabetes, atypical chest pain, and hypokalemia.  Echocardiogram 07/24/2022 showed an LVEF of 60-65%, G1 DD, mild mitral valve regurgitation, moderately dilated left atria, and mild  calcification of the aortic valve with no evidence of aortic valve stenosis.  She was seen in the emergency department on 6/27 and was discharged on 07/30/2022.  She complained of chest discomfort that was intermittent, and weakness.  Her high-sensitivity troponins were normal.  Her EKG showed normal sinus rhythm.  Due to her history she was admitted.  Cardiology was consulted and recommended blood pressure control and medical management.  She was noted to have a heart rate in the 30s-40s and her carvedilol was discontinued.  Her heart rate stabilized.  Her blood pressure medication was adjusted and she was placed on amlodipine.  Her creatinine was noted to be downtrending.  She was noted to be deconditioned.  She was discharged to SNF.  She presented to the clinic 08/04/22 for follow-up evaluation and stated she was doing physical therapy and Occupational Therapy daily.  She was enjoying the food at [employer].  She presented with her daughter.  We reviewed her recent hospitalization and medication changes.  Prior to her admission she was on furosemide 40 mg daily with 20 mill equivalents of potassium.  Her blood pressure was well-controlled at 110/58.  Her pulse was 52.  We reviewed the importance of low-sodium diet.  She indicated that her lower extremity swelling was no longer gone when she woke up in the morning.  I prescribed furosemide 20 mg daily, 10 mill equivalents of potassium, planned repeat  BMP in  1 week and planned follow-up in 2 to 3 weeks.  She presents to the clinic today for follow-up evaluation and states she has returned home.  She is following a low-sodium diet.  She does have some increased anxiety with having Occupational Therapy and physical therapy come to her house.  They are in the process of completing initial evaluation and and recommendations.  Her weight today is 149 pounds down from 155 pounds.  She reports that soon after I resumed her furosemide and potassium, both medications were  doubled from physician [facility].  Due to elevated creatinine and stable weight I will decrease furosemide and potassium to 20 mg and 10 mill equivalents.  She may take an extra dose of 20 mg and 10 mill equivalents for weight increase of 2 to 3 pounds overnight or 5 pounds in 1 week.  I will repeat BMP today and plan follow-up in 3 to 4 months.  I have asked her to maintain a weight log and contact the office with weight increases.  Today she denies chest pain, shortness of breath, fatigue, palpitations, melena, hematuria, hemoptysis, diaphoresis, weakness, presyncope, syncope, orthopnea, and PND.    Home Medications    Prior to Admission medications   Medication Sig Start Date End Date Taking? Authorizing Provider  amLODipine (NORVASC) 10 MG tablet Take 10 mg by mouth at bedtime.    [provider]  apixaban (ELIQUIS) 2.5 MG TABS tablet Take 1 tablet (2.5 mg total) by mouth 2 (two) times daily. 07/30/22   Lanae Boast, MD  carboxymethylcellulose 1 % ophthalmic solution Place 1 drop into both eyes 2 (two) times daily.    [provider]  cholecalciferol (VITAMIN D) 1000 UNITS tablet Take 1,000 Units by mouth daily with lunch.    [provider]  cholestyramine (QUESTRAN) 4 g packet Take 4 g by mouth daily as needed (IBS).    [provider]  cloNIDine (CATAPRES) 0.1 MG tablet TAKE 2 TABLETS BY MOUTH EVERY MORNING AND TAKE 1 TABLET BY MOUTH EVERY NIGHT AT BEDTIME 07/28/22   Runell Gess, MD  isosorbide-hydrALAZINE (BIDIL) 20-37.5 MG tablet Take 1 tablet by mouth 3 (three) times daily. 07/30/22   Lanae Boast, MD  Lancets [facility] ULTRASOFT) lancets use to check blood sugar twice a day as directed 12/18/11   [provider]  Multiple Vitamin (MULTIVITAMIN WITH MINERALS) TABS Take 1 tablet by mouth daily with supper.    [provider]  nitroGLYCERIN (NITROSTAT) 0.4 MG SL tablet PLACE 1 TABLET (0.4 MG TOTAL) UNDER THE TONGUE EVERY FIVE MINUTES X 3 DOSES  AS NEEDED FOR CHEST PAIN. Patient taking differently: Place 0.4 mg under the tongue every 5 (five) minutes as needed for chest pain. 04/13/20 07/26/22  Marykay Lex, MD  olmesartan (BENICAR) 40 MG tablet Take 40 mg by mouth at bedtime.    Geoffry Paradise, MD  omeprazole (PRILOSEC) 40 MG capsule TAKE 1 CAPSULE BY MOUTH DAILY WITH SUPPER Patient taking differently: Take 40 mg by mouth daily. 01/15/22   Runell Gess, MD  [facility] ULTRA test strip 1 each by Other route as needed for other. 06/29/19   [provider]  Probiotic Product (PRO-BIOTIC BLEND) CAPS Take 1 capsule by mouth at bedtime. Takes OTC probiotic    Geoffry Paradise, MD  ranolazine (RANEXA) 500 MG 12 hr tablet TAKE 1 TABLET(500 MG) BY MOUTH TWICE DAILY Patient taking differently: Take 500 mg by mouth 2 (two) times daily. 01/15/22   Runell Gess, MD  rosuvastatin (CRESTOR) 20 MG tablet TAKE 1 TABLET(20 MG) BY MOUTH AT BEDTIME Patient taking differently: Take 20 mg by mouth daily. 03/10/22   Runell Gess, MD  sitaGLIPtin (JANUVIA) 100 MG tablet Take 100 mg by mouth daily with lunch.    [provider]    Family History    Family History  Problem Relation Age of Onset   Hypertension Father    Heart disease Father    Heart disease Mother    Diabetes Mother    Hypertension Mother    Heart disease Brother        both brothers   Diabetes Brother    Hypertension Brother    She indicated that her mother is deceased. She indicated that her father is deceased.  Social History    Social History   Socioeconomic History   Marital status: Widowed    Spouse name: 4 children and 3 living   Number of children: 4   Years of education: Not on file   Highest education level: Not on file  Occupational History   Occupation: Retired  Tobacco Use   Smoking status: Former    Current packs/day: 0.00    Types: Cigarettes    Start date: 07/05/1948    Quit date: 07/05/1953    Years since quitting: 69.1    Smokeless tobacco: Not on file  Vaping Use   Vaping status: Never Used  Substance and Sexual Activity   Alcohol use: No   Drug use: No   Sexual activity: Not Currently  Other Topics Concern   Not on file  Social History Narrative   Lives independently in her own home. Daughter, [name], is 5 min. away and her grandson lives next door.   Social Determinants of Health   Financial Resource Strain: Not on file  Food Insecurity: No Food Insecurity (07/24/2022)   Hunger Vital Sign    Worried About Running Out of Food in the Last Year: Never true    Ran Out of Food in the Last Year: Never true  Transportation Needs: No Transportation Needs (07/24/2022)   PRAPARE - Administrator, Civil Service (Medical): No    Lack of Transportation (Non-Medical): No  Physical Activity: Not on file  Stress: Not on file  Social Connections: Not on file  Intimate Partner Violence: Not At Risk (07/24/2022)   Humiliation, Afraid, Rape, and Kick questionnaire    Fear of Current or Ex-Partner: No    Emotionally Abused: No    Physically Abused: No    Sexually Abused: No     Review of Systems    General:  No chills, fever, night sweats or weight changes.  Cardiovascular:  No chest pain, dyspnea on exertion, edema, orthopnea, palpitations, paroxysmal nocturnal dyspnea. Dermatological: No rash, lesions/masses Respiratory: No cough, dyspnea Urologic: No hematuria, dysuria Abdominal:   No nausea, vomiting, diarrhea, bright red blood per rectum, melena, or hematemesis Neurologic:  No visual changes, wkns, changes in mental status. All other systems reviewed and are otherwise negative except as noted above.  Physical Exam    VS:  BP (!) 136/56 (BP Location: Left Arm, Patient Position: Sitting, Cuff Size: Normal)   Pulse 64   Ht 5\' 5"  (1.651 m)   Wt 149 lb 12.8 oz (67.9 kg)   SpO2 94%   BMI 24.93 kg/m  , BMI Body mass index is 24.93 kg/m. GEN: Well nourished, well developed, in no acute  distress. HEENT: normal. Neck: Supple, no JVD, carotid bruits,  or masses. Cardiac: RRR, no murmurs, rubs, or gallops. No clubbing, cyanosis, Generalized ankle edema.  Radials/DP/PT 2+ and equal bilaterally.  Respiratory:  Respirations regular and unlabored, clear to auscultation bilaterally. GI: Soft, nontender, nondistended, BS + x 4. MS: no deformity or atrophy. Skin: warm and dry, no rash. Neuro:  Strength and sensation are intact. Psych: Normal affect.  Accessory Clinical Findings    Recent Labs: 03/19/2022: ALT 24 07/23/2022: B Natriuretic Peptide 191.7 07/25/2022: Magnesium 2.0 07/28/2022: Hemoglobin 10.3; Platelets 158 07/30/2022: BUN 27; Creatinine, Ser 1.54; Potassium 3.9; Sodium 132   Recent Lipid Panel    Component Value Date/Time   CHOL 109 07/26/2022 0054   TRIG 52 07/26/2022 0054   HDL 57 07/26/2022 0054   CHOLHDL 1.9 07/26/2022 0054   VLDL 10 07/26/2022 0054   LDLCALC 42 07/26/2022 0054         ECG personally reviewed by me today-none today.  Echocardiogram 07/24/2022 IMPRESSIONS     1. Left ventricular ejection fraction, by estimation, is 60 to 65%. The  left ventricle has normal function. The left ventricle has no regional  wall motion abnormalities. There is moderate basal septal hypertrophy. The  rest of the LV segments demonstrate   mild concentric left ventricular hypertrophy of the basal-septal segment.  Left ventricular diastolic parameters are consistent with Grade II  diastolic dysfunction (pseudonormalization).   2. Right ventricular systolic function is normal. The right ventricular  size is normal.   3. Left atrial size was moderately dilated.   4. The mitral valve is grossly normal. Mild mitral valve regurgitation.   5. The aortic valve is tricuspid. There is mild calcification of the  aortic valve. There is mild thickening of the aortic valve. Aortic valve  regurgitation is not visualized. Aortic valve sclerosis/calcification is  present,  without any evidence of  aortic stenosis.   6. The inferior vena cava is normal in size with greater than 50%  respiratory variability, suggesting right atrial pressure of 3 mmHg.   Comparison(s): No significant change from prior study.     Assessment & Plan   1.  Acute on chronic diastolic CHF-weight 149.8 pounds.  Bilateral generalized ankle edema.  Not a candidate for Entresto due to renal function. Continue isosorbide dinitrate, hydralazine  Decreased furosemide to 20 mg daily,  Decrease potassium to 10 mill equivalents daily May take an extra dose of 20 mg furosemide and 10 mill equivalent potassium as needed for weight increase of 2 to 3 pounds overnight or 5 pounds in 1 week Daily weights-maintain weight log, contact office with a weight increase of 2 to 3 pounds overnight or 5 pounds in 1 week. Heart healthy low-sodium diet-reviewed Elevate lower extremities when not active Order BMP  Chest discomfort-continues to deny chest discomfort.  Prior ED visit was felt that her chest discomfort was related to her elevated blood pressure.  Atypical in nature.  Underwent cardiac catheterization 2022 showed nonobstructive CAD. Continue Ranexa, amlodipine Heart healthy low-sodium diet  Bradycardia-heart rate today 64 bpm.  Denies lightheadedness, presyncope or syncope.  Carvedilol was discontinued during recent admission. Continue amlodipine Avoid AV nodal blocking agents  History of atrial fibrillation-reports compliance with apixaban.  Denies bleeding issues.  Previously, carvedilol discontinued due to bradycardia Continue apixaban Avoid triggers caffeine, chocolate, EtOH, dehydration etc.  DOE-breathing at baseline.  Improved with diuresis and increase physical activity.  Echocardiogram 07/24/2022 showed normal LVEF and G2 DD with mild mitral valve regurgitation. Continue current medical therapy Maintain physical activity  Disposition:  Follow-up with Dr.Berry or me in 3-4  months.  Thomasene Ripple. Avenir Lozinski NP-C     08/27/2022, 9:51 AM [facility] [address] Office [phone] Fax [phone]    I spent 14 minutes examining this patient, reviewing medications, and using patient centered shared decision making involving her cardiac care.  Prior to her visit I spent greater than 20 minutes reviewing her past medical history,  medications, and prior cardiac tests.

## 2022-08-25 NOTE — Telephone Encounter (Signed)
Spoke with daughter and advised information.  She states understanding.  New medications sent to Pharmacy.

## 2022-08-25 NOTE — Telephone Encounter (Signed)
The two separate drugs are very inexpensive. Can separate into isosorbide dinitrate 20mg  and hydralazine 25mg  tabs (1.5 tablets) 3 times day.

## 2022-08-26 ENCOUNTER — Telehealth: Payer: Self-pay | Admitting: *Deleted

## 2022-08-26 DIAGNOSIS — E1165 Type 2 diabetes mellitus with hyperglycemia: Secondary | ICD-10-CM | POA: Diagnosis not present

## 2022-08-26 DIAGNOSIS — I5022 Chronic systolic (congestive) heart failure: Secondary | ICD-10-CM | POA: Diagnosis not present

## 2022-08-26 DIAGNOSIS — J9601 Acute respiratory failure with hypoxia: Secondary | ICD-10-CM | POA: Diagnosis not present

## 2022-08-26 DIAGNOSIS — I2510 Atherosclerotic heart disease of native coronary artery without angina pectoris: Secondary | ICD-10-CM | POA: Diagnosis not present

## 2022-08-26 DIAGNOSIS — I5033 Acute on chronic diastolic (congestive) heart failure: Secondary | ICD-10-CM | POA: Diagnosis not present

## 2022-08-26 DIAGNOSIS — I110 Hypertensive heart disease with heart failure: Secondary | ICD-10-CM | POA: Diagnosis not present

## 2022-08-26 NOTE — Progress Notes (Signed)
  Care Coordination   Note   08/26/2022 Name: Allison Duffy MRN: [id number] DOB: May 07, 1926  Allison Duffy is a 89 y.o. year old female who sees Geoffry Paradise, MD for primary care. I reached out to [name] by phone today to offer care coordination services.  [name] was given information about Care Coordination services today including:   The Care Coordination services include support from the care team which includes your Nurse Coordinator, Clinical Social Worker, or Pharmacist.  The Care Coordination team is here to help remove barriers to the health concerns and goals most important to you. Care Coordination services are voluntary, and the patient may decline or stop services at any time by request to their care team member.   Care Coordination Consent Status: Patient agreed to services and verbal consent obtained.   Follow up plan:  Telephone appointment with care coordination team member scheduled for:  09/10/2022  Encounter Outcome:  Pt. Scheduled from referral   Burman Nieves, [facility] Care Coordination Care Guide Direct Dial: [phone]

## 2022-08-27 ENCOUNTER — Encounter: Payer: Self-pay | Admitting: General Practice

## 2022-08-27 ENCOUNTER — Ambulatory Visit: Payer: Medicare Other | Attending: General Practice | Admitting: General Practice

## 2022-08-27 VITALS — BP 136/56 | HR 64 | Ht 65.0 in | Wt 149.8 lb

## 2022-08-27 DIAGNOSIS — I480 Paroxysmal atrial fibrillation: Secondary | ICD-10-CM | POA: Diagnosis not present

## 2022-08-27 DIAGNOSIS — R0609 Other forms of dyspnea: Secondary | ICD-10-CM | POA: Insufficient documentation

## 2022-08-27 DIAGNOSIS — R0789 Other chest pain: Secondary | ICD-10-CM | POA: Diagnosis not present

## 2022-08-27 DIAGNOSIS — I5033 Acute on chronic diastolic (congestive) heart failure: Secondary | ICD-10-CM | POA: Insufficient documentation

## 2022-08-27 DIAGNOSIS — R001 Bradycardia, unspecified: Secondary | ICD-10-CM | POA: Diagnosis not present

## 2022-08-27 MED ORDER — POTASSIUM CHLORIDE ER 10 MEQ PO TBCR: 10.0000 meq | EXTENDED_RELEASE_TABLET | Freq: Every day | ORAL | 3 refills | Status: DC

## 2022-08-27 MED ORDER — FUROSEMIDE 20 MG PO TABS: 20.0000 mg | ORAL_TABLET | Freq: Every day | ORAL | 3 refills | Status: AC

## 2022-08-27 NOTE — Patient Instructions (Addendum)
Medication Instructions:  TAKE FUROSEMIDE 20MG  DAILY MAY TAKE EXTRA 20MG  FOR WT GAIN >2#/OVERNIGHT -OR- 5#/WEEKLY TAKE POTASSIUM DAILY MAY TAKE EXTRA FOR WT GAIN >2#/OVERNIGHT -OR- 5#/WEEKLY *If you need a refill on your cardiac medications before your next appointment, please call your pharmacy*  Lab Work: BMET TODAY If you have labs (blood work) drawn today and your tests are completely normal, you will receive your results only by:  MyChart Message (if you have MyChart) OR  A paper copy in the mail If you have any lab test that is abnormal or we need to change your treatment, we will call you to review the results.  Testing/Procedures: NONE  Follow-Up: At [facility], you and your health needs are our priority.  As part of our continuing mission to provide you with exceptional heart care, we have created designated Provider Care Teams.  These Care Teams include your primary Cardiologist (physician) and Advanced Practice Providers (APPs -  Physician Assistants and Nurse Practitioners) who all work together to provide you with the care you need, when you need it.  Your next appointment:   3-4 month(s)  Provider:   Nanetta Batty, MD  or Edd Fabian, FNP       Other Instructions MAINTAIN PHYSICAL ACTIVITY-AS TOLERATED TAKE AND LOG YOUR WEIGHT DAILY FOR ANXIETY MEDS CONTACT YOUR DR Jacky Kindle

## 2022-08-28 DIAGNOSIS — E1165 Type 2 diabetes mellitus with hyperglycemia: Secondary | ICD-10-CM | POA: Diagnosis not present

## 2022-08-28 DIAGNOSIS — I5022 Chronic systolic (congestive) heart failure: Secondary | ICD-10-CM | POA: Diagnosis not present

## 2022-08-28 DIAGNOSIS — I2510 Atherosclerotic heart disease of native coronary artery without angina pectoris: Secondary | ICD-10-CM | POA: Diagnosis not present

## 2022-08-28 DIAGNOSIS — I5033 Acute on chronic diastolic (congestive) heart failure: Secondary | ICD-10-CM | POA: Diagnosis not present

## 2022-08-28 DIAGNOSIS — I110 Hypertensive heart disease with heart failure: Secondary | ICD-10-CM | POA: Diagnosis not present

## 2022-08-28 DIAGNOSIS — J9601 Acute respiratory failure with hypoxia: Secondary | ICD-10-CM | POA: Diagnosis not present

## 2022-09-03 DIAGNOSIS — I5032 Chronic diastolic (congestive) heart failure: Secondary | ICD-10-CM | POA: Diagnosis not present

## 2022-09-03 DIAGNOSIS — I200 Unstable angina: Secondary | ICD-10-CM | POA: Diagnosis not present

## 2022-09-03 DIAGNOSIS — N183 Chronic kidney disease, stage 3 unspecified: Secondary | ICD-10-CM | POA: Diagnosis not present

## 2022-09-03 DIAGNOSIS — I169 Hypertensive crisis, unspecified: Secondary | ICD-10-CM | POA: Diagnosis not present

## 2022-09-03 DIAGNOSIS — I480 Paroxysmal atrial fibrillation: Secondary | ICD-10-CM | POA: Diagnosis not present

## 2022-09-03 DIAGNOSIS — R001 Bradycardia, unspecified: Secondary | ICD-10-CM | POA: Diagnosis not present

## 2022-09-03 DIAGNOSIS — I129 Hypertensive chronic kidney disease with stage 1 through stage 4 chronic kidney disease, or unspecified chronic kidney disease: Secondary | ICD-10-CM | POA: Diagnosis not present

## 2022-09-03 DIAGNOSIS — N179 Acute kidney failure, unspecified: Secondary | ICD-10-CM | POA: Diagnosis not present

## 2022-09-03 DIAGNOSIS — E1129 Type 2 diabetes mellitus with other diabetic kidney complication: Secondary | ICD-10-CM | POA: Diagnosis not present

## 2022-09-03 DIAGNOSIS — R0609 Other forms of dyspnea: Secondary | ICD-10-CM | POA: Diagnosis not present

## 2022-09-04 DIAGNOSIS — J9601 Acute respiratory failure with hypoxia: Secondary | ICD-10-CM | POA: Diagnosis not present

## 2022-09-04 DIAGNOSIS — I2510 Atherosclerotic heart disease of native coronary artery without angina pectoris: Secondary | ICD-10-CM | POA: Diagnosis not present

## 2022-09-04 DIAGNOSIS — E1165 Type 2 diabetes mellitus with hyperglycemia: Secondary | ICD-10-CM | POA: Diagnosis not present

## 2022-09-04 DIAGNOSIS — I5033 Acute on chronic diastolic (congestive) heart failure: Secondary | ICD-10-CM | POA: Diagnosis not present

## 2022-09-04 DIAGNOSIS — I5022 Chronic systolic (congestive) heart failure: Secondary | ICD-10-CM | POA: Diagnosis not present

## 2022-09-04 DIAGNOSIS — I110 Hypertensive heart disease with heart failure: Secondary | ICD-10-CM | POA: Diagnosis not present

## 2022-09-05 DIAGNOSIS — I2510 Atherosclerotic heart disease of native coronary artery without angina pectoris: Secondary | ICD-10-CM | POA: Diagnosis not present

## 2022-09-05 DIAGNOSIS — I5022 Chronic systolic (congestive) heart failure: Secondary | ICD-10-CM | POA: Diagnosis not present

## 2022-09-05 DIAGNOSIS — J9601 Acute respiratory failure with hypoxia: Secondary | ICD-10-CM | POA: Diagnosis not present

## 2022-09-05 DIAGNOSIS — I110 Hypertensive heart disease with heart failure: Secondary | ICD-10-CM | POA: Diagnosis not present

## 2022-09-05 DIAGNOSIS — I5033 Acute on chronic diastolic (congestive) heart failure: Secondary | ICD-10-CM | POA: Diagnosis not present

## 2022-09-05 DIAGNOSIS — E1165 Type 2 diabetes mellitus with hyperglycemia: Secondary | ICD-10-CM | POA: Diagnosis not present

## 2022-09-09 DIAGNOSIS — I2510 Atherosclerotic heart disease of native coronary artery without angina pectoris: Secondary | ICD-10-CM | POA: Diagnosis not present

## 2022-09-09 DIAGNOSIS — E1165 Type 2 diabetes mellitus with hyperglycemia: Secondary | ICD-10-CM | POA: Diagnosis not present

## 2022-09-09 DIAGNOSIS — I5022 Chronic systolic (congestive) heart failure: Secondary | ICD-10-CM | POA: Diagnosis not present

## 2022-09-09 DIAGNOSIS — I110 Hypertensive heart disease with heart failure: Secondary | ICD-10-CM | POA: Diagnosis not present

## 2022-09-09 DIAGNOSIS — I5033 Acute on chronic diastolic (congestive) heart failure: Secondary | ICD-10-CM | POA: Diagnosis not present

## 2022-09-09 DIAGNOSIS — J9601 Acute respiratory failure with hypoxia: Secondary | ICD-10-CM | POA: Diagnosis not present

## 2022-09-10 ENCOUNTER — Ambulatory Visit: Payer: Self-pay

## 2022-09-10 DIAGNOSIS — J9601 Acute respiratory failure with hypoxia: Secondary | ICD-10-CM | POA: Diagnosis not present

## 2022-09-10 DIAGNOSIS — I2510 Atherosclerotic heart disease of native coronary artery without angina pectoris: Secondary | ICD-10-CM | POA: Diagnosis not present

## 2022-09-10 DIAGNOSIS — I081 Rheumatic disorders of both mitral and tricuspid valves: Secondary | ICD-10-CM | POA: Diagnosis not present

## 2022-09-10 DIAGNOSIS — I110 Hypertensive heart disease with heart failure: Secondary | ICD-10-CM | POA: Diagnosis not present

## 2022-09-10 DIAGNOSIS — E1165 Type 2 diabetes mellitus with hyperglycemia: Secondary | ICD-10-CM | POA: Diagnosis not present

## 2022-09-10 DIAGNOSIS — Z7984 Long term (current) use of oral hypoglycemic drugs: Secondary | ICD-10-CM | POA: Diagnosis not present

## 2022-09-10 DIAGNOSIS — I700 Atherosclerosis of aorta: Secondary | ICD-10-CM | POA: Diagnosis not present

## 2022-09-10 DIAGNOSIS — J984 Other disorders of lung: Secondary | ICD-10-CM | POA: Diagnosis not present

## 2022-09-10 DIAGNOSIS — N179 Acute kidney failure, unspecified: Secondary | ICD-10-CM | POA: Diagnosis not present

## 2022-09-10 DIAGNOSIS — I4891 Unspecified atrial fibrillation: Secondary | ICD-10-CM | POA: Diagnosis not present

## 2022-09-10 DIAGNOSIS — I5043 Acute on chronic combined systolic (congestive) and diastolic (congestive) heart failure: Secondary | ICD-10-CM | POA: Diagnosis not present

## 2022-09-10 DIAGNOSIS — I4892 Unspecified atrial flutter: Secondary | ICD-10-CM | POA: Diagnosis not present

## 2022-09-10 NOTE — Patient Outreach (Signed)
  Care Coordination   Initial Visit Note   09/10/2022 Name: Allison Duffy MRN: [id number] DOB: 1926/02/20  Allison Duffy is a 89 y.o. year old female who sees Allison Paradise, MD for primary care. I spoke with  Allison Duffy and daughter Allison Duffy by phone today.  What matters to the patients health and wellness today?  Patient would like to improve her A1c.     Goals Addressed             This Visit's Progress    To lower A1c       Care Coordination Interventions: Provided education to patient about basic DM disease process Reviewed medications with patient and discussed importance of medication adherence Provided patient with written educational materials related to hypo and hyperglycemia and importance of correct treatment Advised patient, providing education and rationale, to check cbg twice daily before meals and record, calling PCP for findings outside established parameters Review of patient status, including review of consultants reports, relevant laboratory and other test results, and medications completed Counseled on Diabetic diet, my plate method, portion control, carb choices Mailed printed educational materials related to diabetes management Lab Results  Component Value Date   HGBA1C 7.3 (H) 07/24/2022      Interventions Today    Flowsheet Row Most Recent Value  Chronic Disease   Chronic disease during today's visit Diabetes  General Interventions   General Interventions Discussed/Reviewed General Interventions Discussed, General Interventions Reviewed, Labs, Doctor Visits  Doctor Visits Discussed/Reviewed Doctor Visits Discussed, Doctor Visits Reviewed, PCP, Specialist  Education Interventions   Education Provided Provided Education, Provided Printed Education  Provided Verbal Education On Nutrition, Blood Sugar Monitoring, Medication, When to see the doctor  Nutrition Interventions   Nutrition Discussed/Reviewed Nutrition Discussed, Nutrition  Reviewed, Carbohydrate meal planning, Portion sizes  Pharmacy Interventions   Pharmacy Dicussed/Reviewed Pharmacy Topics Discussed, Pharmacy Topics Reviewed          SDOH assessments and interventions completed:  No     Care Coordination Interventions:  Yes, provided   Follow up plan: Follow up call scheduled for 10/08/22 @11 :00 AM    Encounter Outcome:  Pt. Visit Completed

## 2022-09-10 NOTE — Patient Instructions (Signed)
Visit Information  Thank you for taking time to visit with me today. Please don't hesitate to contact me if I can be of assistance to you.   Following are the goals we discussed today:   Goals Addressed             This Visit's Progress    To lower A1c       Care Coordination Interventions: Provided education to patient about basic DM disease process Reviewed medications with patient and discussed importance of medication adherence Provided patient with written educational materials related to hypo and hyperglycemia and importance of correct treatment Advised patient, providing education and rationale, to check cbg twice daily before meals and record, calling PCP for findings outside established parameters Review of patient status, including review of consultants reports, relevant laboratory and other test results, and medications completed Counseled on Diabetic diet, my plate method, portion control, carb choices Mailed printed educational materials related to diabetes management Lab Results  Component Value Date   HGBA1C 7.3 (H) 07/24/2022          Our next appointment is by telephone on 10/08/22 at 11:00 AM  Please call the care guide team at [phone] if you need to cancel or reschedule your appointment.   If you are experiencing a Mental Health or Behavioral Health Crisis or need someone to talk to, please call 1-800-273-TALK (toll free, 24 hour hotline)  Patient verbalizes understanding of instructions and care plan provided today and agrees to view in MyChart. Active MyChart status and patient understanding of how to access instructions and care plan via MyChart confirmed with patient.     Delsa Sale, RN, BSN, CCM Care Management Coordinator [facility] Care Management  Direct Phone: [phone]

## 2022-09-12 DIAGNOSIS — I110 Hypertensive heart disease with heart failure: Secondary | ICD-10-CM | POA: Diagnosis not present

## 2022-09-12 DIAGNOSIS — I5033 Acute on chronic diastolic (congestive) heart failure: Secondary | ICD-10-CM | POA: Diagnosis not present

## 2022-09-12 DIAGNOSIS — I5022 Chronic systolic (congestive) heart failure: Secondary | ICD-10-CM | POA: Diagnosis not present

## 2022-09-12 DIAGNOSIS — E1165 Type 2 diabetes mellitus with hyperglycemia: Secondary | ICD-10-CM | POA: Diagnosis not present

## 2022-09-12 DIAGNOSIS — I2510 Atherosclerotic heart disease of native coronary artery without angina pectoris: Secondary | ICD-10-CM | POA: Diagnosis not present

## 2022-09-12 DIAGNOSIS — J9601 Acute respiratory failure with hypoxia: Secondary | ICD-10-CM | POA: Diagnosis not present

## 2022-09-16 DIAGNOSIS — I5033 Acute on chronic diastolic (congestive) heart failure: Secondary | ICD-10-CM | POA: Diagnosis not present

## 2022-09-16 DIAGNOSIS — E1165 Type 2 diabetes mellitus with hyperglycemia: Secondary | ICD-10-CM | POA: Diagnosis not present

## 2022-09-16 DIAGNOSIS — I110 Hypertensive heart disease with heart failure: Secondary | ICD-10-CM | POA: Diagnosis not present

## 2022-09-16 DIAGNOSIS — J9601 Acute respiratory failure with hypoxia: Secondary | ICD-10-CM | POA: Diagnosis not present

## 2022-09-16 DIAGNOSIS — I5022 Chronic systolic (congestive) heart failure: Secondary | ICD-10-CM | POA: Diagnosis not present

## 2022-09-16 DIAGNOSIS — I2510 Atherosclerotic heart disease of native coronary artery without angina pectoris: Secondary | ICD-10-CM | POA: Diagnosis not present

## 2022-10-08 ENCOUNTER — Ambulatory Visit: Payer: Self-pay

## 2022-10-09 NOTE — Patient Outreach (Signed)
  Care Coordination   Follow Up Visit Note   10/08/2022 Name: Allison Duffy MRN: [id number] DOB: 1926/12/10  Allison Duffy is a 89 y.o. year old female who sees Allison Paradise, MD for primary care. I spoke with  Allison Duffy by phone today.  What matters to the patients health and wellness today?  Patient would like to stay health and independent.     Goals Addressed             This Visit's Progress    To lower A1c   On track    Care Coordination Interventions: Reviewed medications with patient and discussed importance of medication adherence Provided patient with written educational materials related to hypo and hyperglycemia and importance of correct treatment Advised patient, providing education and rationale, to check cbg twice daily before meals and record, calling PCP for findings outside established parameters Review of patient status, including review of consultants reports, relevant laboratory and other test results, and medications completed Confirmed patient received and reviewed the diabetes educational materials previously mailed, she denies having questions Discussed plans with patient for ongoing care coordination follow up and provided patient with direct contact information for nurse care coordinator Reviewed and discussed with patient her upcoming scheduled follow up MD appointments with Allison Duffy on 11/10/22 @4 :00 PM, Allison Duffy Cardiologist on 12/03/22 @2 :00 PM Lab Results  Component Value Date   HGBA1C 7.3 (H) 07/24/2022     Interventions Today    Flowsheet Row Most Recent Value  Chronic Disease   Chronic disease during today's visit Diabetes  General Interventions   General Interventions Discussed/Reviewed General Interventions Discussed, General Interventions Reviewed, Doctor Visits, Durable Medical Equipment (DME)  Doctor Visits Discussed/Reviewed Doctor Visits Discussed, Doctor Visits Reviewed, PCP, Specialist  Durable Medical Equipment  (DME) Glucomoter  Education Interventions   Education Provided Provided Education  Provided Verbal Education On Blood Sugar Monitoring, When to see the doctor, Nutrition, Medication  Nutrition Interventions   Nutrition Discussed/Reviewed Nutrition Discussed, Nutrition Reviewed, Carbohydrate meal planning, Portion sizes          SDOH assessments and interventions completed:  No     Care Coordination Interventions:  Yes, provided   Follow up plan: Follow up call scheduled for 12/05/22 @2 :00 PM     Encounter Outcome:  Patient Visit Completed

## 2022-10-09 NOTE — Patient Instructions (Signed)
Visit Information  Thank you for taking time to visit with me today. Please don't hesitate to contact me if I can be of assistance to you.   Following are the goals we discussed today:   Goals Addressed             This Visit's Progress    To lower A1c   On track    Care Coordination Interventions: Reviewed medications with patient and discussed importance of medication adherence Provided patient with written educational materials related to hypo and hyperglycemia and importance of correct treatment Advised patient, providing education and rationale, to check cbg twice daily before meals and record, calling PCP for findings outside established parameters Review of patient status, including review of consultants reports, relevant laboratory and other test results, and medications completed Confirmed patient received and reviewed the diabetes educational materials previously mailed, she denies having questions Discussed plans with patient for ongoing care coordination follow up and provided patient with direct contact information for nurse care coordinator Reviewed and discussed with patient her upcoming scheduled follow up MD appointments with Dr. Jacky Kindle on 11/10/22 @4 :00 PM, Dr. Allyson Sabal Cardiologist on 12/03/22 @2 :00 PM Lab Results  Component Value Date   HGBA1C 7.3 (H) 07/24/2022         Our next appointment is by telephone on 12/05/22 at 2:00 PM  Please call the care guide team at [phone] if you need to cancel or reschedule your appointment.   If you are experiencing a Mental Health or Behavioral Health Crisis or need someone to talk to, please call 1-800-273-TALK (toll free, 24 hour hotline)  Patient verbalizes understanding of instructions and care plan provided today and agrees to view in MyChart. Active MyChart status and patient understanding of how to access instructions and care plan via MyChart confirmed with patient.     Delsa Sale RN BSN CCM [employer]   [facility], [facility] Nurse Care Coordinator  Direct Dial: [phone] Website: [email or web address]

## 2022-11-10 ENCOUNTER — Other Ambulatory Visit: Payer: Self-pay | Admitting: General Practice

## 2022-11-10 ENCOUNTER — Other Ambulatory Visit: Payer: Self-pay | Admitting: Cardiovascular Disease

## 2022-11-10 DIAGNOSIS — I200 Unstable angina: Secondary | ICD-10-CM

## 2022-11-10 DIAGNOSIS — I10 Essential (primary) hypertension: Secondary | ICD-10-CM

## 2022-11-10 DIAGNOSIS — E1169 Type 2 diabetes mellitus with other specified complication: Secondary | ICD-10-CM

## 2022-11-24 DIAGNOSIS — E785 Hyperlipidemia, unspecified: Secondary | ICD-10-CM | POA: Diagnosis not present

## 2022-11-24 DIAGNOSIS — E663 Overweight: Secondary | ICD-10-CM | POA: Diagnosis not present

## 2022-11-24 DIAGNOSIS — E1129 Type 2 diabetes mellitus with other diabetic kidney complication: Secondary | ICD-10-CM | POA: Diagnosis not present

## 2022-11-24 DIAGNOSIS — I480 Paroxysmal atrial fibrillation: Secondary | ICD-10-CM | POA: Diagnosis not present

## 2022-11-24 DIAGNOSIS — I110 Hypertensive heart disease with heart failure: Secondary | ICD-10-CM | POA: Diagnosis not present

## 2022-11-24 DIAGNOSIS — Z7901 Long term (current) use of anticoagulants: Secondary | ICD-10-CM | POA: Diagnosis not present

## 2022-11-24 DIAGNOSIS — Z23 Encounter for immunization: Secondary | ICD-10-CM | POA: Diagnosis not present

## 2022-11-24 DIAGNOSIS — M1990 Unspecified osteoarthritis, unspecified site: Secondary | ICD-10-CM | POA: Diagnosis not present

## 2022-11-24 DIAGNOSIS — I5032 Chronic diastolic (congestive) heart failure: Secondary | ICD-10-CM | POA: Diagnosis not present

## 2022-11-24 DIAGNOSIS — R269 Unspecified abnormalities of gait and mobility: Secondary | ICD-10-CM | POA: Diagnosis not present

## 2022-11-24 DIAGNOSIS — I700 Atherosclerosis of aorta: Secondary | ICD-10-CM | POA: Diagnosis not present

## 2022-12-03 ENCOUNTER — Ambulatory Visit: Payer: Medicare Other | Attending: Cardiovascular Disease | Admitting: Cardiovascular Disease

## 2022-12-03 ENCOUNTER — Encounter: Payer: Self-pay | Admitting: Cardiovascular Disease

## 2022-12-03 VITALS — BP 130/58 | HR 66 | Ht 65.0 in | Wt 152.0 lb

## 2022-12-03 DIAGNOSIS — E785 Hyperlipidemia, unspecified: Secondary | ICD-10-CM | POA: Insufficient documentation

## 2022-12-03 DIAGNOSIS — I5033 Acute on chronic diastolic (congestive) heart failure: Secondary | ICD-10-CM | POA: Insufficient documentation

## 2022-12-03 DIAGNOSIS — I259 Chronic ischemic heart disease, unspecified: Secondary | ICD-10-CM | POA: Diagnosis not present

## 2022-12-03 DIAGNOSIS — I10 Essential (primary) hypertension: Secondary | ICD-10-CM | POA: Insufficient documentation

## 2022-12-03 DIAGNOSIS — E1169 Type 2 diabetes mellitus with other specified complication: Secondary | ICD-10-CM | POA: Diagnosis not present

## 2022-12-03 DIAGNOSIS — I480 Paroxysmal atrial fibrillation: Secondary | ICD-10-CM | POA: Diagnosis not present

## 2022-12-03 NOTE — Assessment & Plan Note (Signed)
History of coronary artery disease status post cardiac catheterization performed by myself 04/12/2020 revealing moderate segmental proximal LAD disease which I did not think was obstructive I recommended medical therapy.  This was done after coronary CTA performed 04/11/2020 revealed a coronary calcium score of 2614.  She continues to deny chest pain.

## 2022-12-03 NOTE — Assessment & Plan Note (Signed)
History of hyperlipidemia on statin therapy with lipid profile performed 07/26/2022 revealing a total cholesterol 109, LDL 42 and HDL of 57.

## 2022-12-03 NOTE — Patient Instructions (Signed)
Medication Instructions:  No changes  *If you need a refill on your cardiac medications before your next appointment, please call your pharmacy*   Lab Work: Not needed  .   Testing/Procedures:  Not needed  Follow-Up: At [facility], you and your health needs are our priority.  As part of our continuing mission to provide you with exceptional heart care, we have created designated Provider Care Teams.  These Care Teams include your primary Cardiologist (physician) and Advanced Practice Providers (APPs -  Physician Assistants and Nurse Practitioners) who all work together to provide you with the care you need, when you need it.     Your next appointment:   6 month(s)  The format for your next appointment:   In Person  Provider:   Edd Fabian, FNP    Then, Nanetta Batty, MD will plan to see you again in 12 month(s).

## 2022-12-03 NOTE — Assessment & Plan Note (Signed)
History of essential hypertension her blood pressure measured today at 130/58.  She is on amlodipine, hydralazine, isosorbide, Benicar.

## 2022-12-03 NOTE — Progress Notes (Signed)
12/03/2022 Allison Duffy   Sep 23, 1926  [id number]  Primary Physician Geoffry Paradise, MD Primary Cardiologist: Runell Gess MD Nicholes Calam[place], [place]  HPI:  Allison Duffy is a 89 y.o.  mildly overweight widowed Caucasian female mother of 3 living daughters (one deceased), grandmother a grandchildren. Her daughter Allison Duffy accompanies her today who I have seen as a patient remotely as well.  I last saw her in the office 10/08/2021.Marland Kitchen  She was referred by Dr. Jacky Kindle for cardiovascular evaluation because of new onset effort angina or shortness of breath and fatigue.her cardiac risk factor profile is notable for treated hypertension, diabetes and hyperlipidemia. She does not smoke. There is no family history. She has never had a heart attack or stroke. Over the last 6 months she's noticed new onset effort angina, dyspnea and fatigue. The symptoms do not occur at rest. They do not awaken her from sleep she had a 2D echo performed 07/20/2013 with cyst which was essentially normal as was a Myoview stress test back in.    She was admitted to the hospital 01/27/2019 with combined systolic and diastolic heart failure, atrial fibrillation.  She was diuresed, rate controlled and placed on oral anticoagulation.  She still complains of some dyspnea on exertion.  She weighs herself on a daily basis and avoid salt.  2D echo was performed that showed preserved LV function with moderate to severe mitral vegetation.     She performs her activities of daily living with minimal limitation.  She does live alone.  She still drives short distances and does her daily housework.  She gets occasionally short of breath but not so much that it affects her quality of life.   I did get a coronary CTA on her 04/11/2020 revealing a coronary calcium score 2614.  Based on this I performed cardiac catheterization on her 04/12/2020 revealing moderate segmental proximal LAD disease which I did not think was obstructive and  recommended medical therapy.  She really denies chest pain.  She was admitted to the hospital on 03/29/2021 with community-acquired pneumonia, sepsis and diastolic heart failure.  She was treated with antibiotics and diuresis.  Prior to that she did have a flareup of her colitis.  She is currently in a skilled nursing facility.  She is mildly volume overloaded with 1+ pitting edema.  Her daughter Allison Duffy does relate that the food is high in sodium.   Since I saw her a year ago she continues to do well.  She was admitted to the hospital with hypertension and diastolic heart failure for several days and was diuresed.  She continues to be independent, drives and cooks for herself.  She does complain of some dyspnea on exertion but denies chest pain.   Current Meds  Medication Sig   amLODipine (NORVASC) 10 MG tablet Take 10 mg by mouth at bedtime.   apixaban (ELIQUIS) 5 MG TABS tablet TAKE 1 TABLET BY MOUTH TWICE DAILY   carboxymethylcellulose 1 % ophthalmic solution Place 1 drop into both eyes 2 (two) times daily.   cholecalciferol (VITAMIN D) 1000 UNITS tablet Take 1,000 Units by mouth daily with lunch.   cholestyramine (QUESTRAN) 4 g packet Take 4 g by mouth daily as needed (IBS).   cloNIDine (CATAPRES) 0.1 MG tablet TAKE 2 TABLETS BY MOUTH EVERY MORNING AND TAKE 1 TABLET BY MOUTH EVERY NIGHT AT BEDTIME   hydrALAZINE (APRESOLINE) 25 MG tablet TAKE 1 1/2 TABLETS(37.5 MG) THREE TIMES DAILY   isosorbide dinitrate (  ISORDIL) 20 MG tablet TAKE 1 TABLET BY MOUTH THREE TIMES DAILY. TAKE WITH HYDRALAZINE 37.5MG (1 AND A 1/2 TABLETS)   Lancets (ONETOUCH ULTRASOFT) lancets use to check blood sugar twice a day as directed   metFORMIN (GLUCOPHAGE) 500 MG tablet Take 500 mg by mouth once.   Multiple Vitamin (MULTIVITAMIN WITH MINERALS) TABS Take 1 tablet by mouth daily with supper.   olmesartan (BENICAR) 40 MG tablet Take 40 mg by mouth at bedtime.   omeprazole (PRILOSEC) 40 MG capsule TAKE 1 CAPSULE BY MOUTH  DAILY WITH SUPPER (Patient taking differently: Take 40 mg by mouth daily.)   ONETOUCH ULTRA test strip 1 each by Other route as needed for other.   potassium chloride (KLOR-CON) 10 MEQ tablet TAKE 1 TABLET(10 MEQ) BY MOUTH DAILY-- MAY TAKE AN EXTRA WITH A WEIGHT INCREASE OF 2LBS OVERNIGHT OR 5 LBS TOTAL AS DIRECTED   Probiotic Product (PRO-BIOTIC BLEND) CAPS Take 1 capsule by mouth at bedtime. Takes OTC probiotic   ranolazine (RANEXA) 500 MG 12 hr tablet TAKE 1 TABLET(500 MG) BY MOUTH TWICE DAILY   rosuvastatin (CRESTOR) 20 MG tablet TAKE 1 TABLET(20 MG) BY MOUTH AT BEDTIME   sitaGLIPtin (JANUVIA) 100 MG tablet Take 100 mg by mouth daily with lunch.     Allergies  Allergen Reactions   Codeine Nausea And Vomiting   Hydrocodone     Nausea and vomiting   Prednisone     Other reaction(s): sick on her stomach   Sulfa Antibiotics Itching, Nausea Only and Other (See Comments)    Social History   Socioeconomic History   Marital status: Widowed    Spouse name: 4 children and 3 living   Number of children: 4   Years of education: Not on file   Highest education level: Not on file  Occupational History   Occupation: Retired  Tobacco Use   Smoking status: Former    Current packs/day: 0.00    Types: Cigarettes    Start date: 07/05/1948    Quit date: 07/05/1953    Years since quitting: 69.4   Smokeless tobacco: Not on file  Vaping Use   Vaping status: Never Used  Substance and Sexual Activity   Alcohol use: No   Drug use: No   Sexual activity: Not Currently  Other Topics Concern   Not on file  Social History Narrative   Lives independently in her own home. Daughter, Allison Duffy, is 5 min. away and her grandson lives next door.   Social Determinants of Health   Financial Resource Strain: Not on file  Food Insecurity: No Food Insecurity (07/24/2022)   Hunger Vital Sign    Worried About Running Out of Food in the Last Year: Never true    Ran Out of Food in the Last Year: Never true   Transportation Needs: No Transportation Needs (07/24/2022)   PRAPARE - Administrator, Civil Service (Medical): No    Lack of Transportation (Non-Medical): No  Physical Activity: Not on file  Stress: Not on file  Social Connections: Not on file  Intimate Partner Violence: Not At Risk (07/24/2022)   Humiliation, Afraid, Rape, and Kick questionnaire    Fear of Current or Ex-Partner: No    Emotionally Abused: No    Physically Abused: No    Sexually Abused: No     Review of Systems: General: negative for chills, fever, night sweats or weight changes.  Cardiovascular: negative for chest pain, dyspnea on exertion, edema, orthopnea, palpitations, paroxysmal nocturnal dyspnea  or shortness of breath Dermatological: negative for rash Respiratory: negative for cough or wheezing Urologic: negative for hematuria Abdominal: negative for nausea, vomiting, diarrhea, bright red blood per rectum, melena, or hematemesis Neurologic: negative for visual changes, syncope, or dizziness All other systems reviewed and are otherwise negative except as noted above.    Blood pressure (!) 130/58, pulse 66, height 5\' 5"  (1.651 m), weight 152 lb (68.9 kg), SpO2 96%.  General appearance: alert and no distress Neck: no adenopathy, no carotid bruit, no JVD, supple, symmetrical, trachea midline, and thyroid not enlarged, symmetric, no tenderness/mass/nodules Lungs: clear to auscultation bilaterally Heart: regular rate and rhythm, S1, S2 normal, no murmur, click, rub or gallop Extremities: Trace left greater than right ankle edema Pulses: 2+ and symmetric Skin: Skin color, texture, turgor normal. No rashes or lesions Neurologic: Grossly normal  EKG EKG Interpretation Date/Time:  Wednesday December 03 2022 14:34:10 EST Ventricular Rate:  66 PR Interval:  244 QRS Duration:  92 QT Interval:  464 QTC Calculation: 486 R Axis:   9  Text Interpretation: Sinus rhythm with 1st degree A-V block When  compared with ECG of 26-Jul-2022 10:35, Vent. rate has increased BY  24 BPM Nonspecific T wave abnormality now evident in Lateral leads Confirmed by Nanetta Batty [phone]) on 12/03/2022 2:37:07 PM    ASSESSMENT AND PLAN:   Essential hypertension History of essential hypertension her blood pressure measured today at 130/58.  She is on amlodipine, hydralazine, isosorbide, Benicar.  Hyperlipidemia associated with type 2 diabetes mellitus (HCC) History of hyperlipidemia on statin therapy with lipid profile performed 07/26/2022 revealing a total cholesterol 109, LDL 42 and HDL of 57.  Chest pain CAD History of coronary artery disease status post cardiac catheterization performed by myself 04/12/2020 revealing moderate segmental proximal LAD disease which I did not think was obstructive I recommended medical therapy.  This was done after coronary CTA performed 04/11/2020 revealed a coronary calcium score of 2614.  She continues to deny chest pain.  Acute on chronic diastolic CHF (congestive heart failure) (HCC) History of diastolic heart failure with 2D echo performed 07/24/2022 revealing normal LV systolic function with grade 2 diastolic dysfunction.  There is no significant valvular disease.  She is on oral diuretic and has minimal peripheral edema.  Paroxysmal atrial fibrillation (HCC) History of paroxysmal A-fib maintaining sinus rhythm on Eliquis oral anticoagulation.     Runell Gess MD FACP,FACC,FAHA, [facility] 12/03/2022 2:52 PM

## 2022-12-03 NOTE — Assessment & Plan Note (Signed)
History of paroxysmal A. fib maintaining sinus rhythm on Eliquis oral anticoagulation 

## 2022-12-03 NOTE — Assessment & Plan Note (Signed)
History of diastolic heart failure with 2D echo performed 07/24/2022 revealing normal LV systolic function with grade 2 diastolic dysfunction.  There is no significant valvular disease.  She is on oral diuretic and has minimal peripheral edema.

## 2022-12-04 ENCOUNTER — Telehealth: Payer: Self-pay | Admitting: Cardiovascular Disease

## 2022-12-04 NOTE — Telephone Encounter (Signed)
Patient's daughter is returning call and is requesting call back.

## 2022-12-04 NOTE — Telephone Encounter (Signed)
Patient's daughter is calling stating they received some unfortunate news about the patient's other daughter yesterday after her appt and are wanting to know if Xanax or something can be prescribed for the patient.   Please advise.

## 2022-12-04 NOTE — Telephone Encounter (Signed)
Left message to call back  

## 2022-12-04 NOTE — Telephone Encounter (Addendum)
Called and spoke to patient. Advised to call PCP for Xanax request.

## 2022-12-05 ENCOUNTER — Ambulatory Visit: Payer: Self-pay

## 2022-12-08 NOTE — Patient Outreach (Signed)
  Care Coordination   Follow Up Visit Note   12/05/2022 Name: Allison Duffy MRN: [id number] DOB: March 06, 1926  Allison Duffy is a 89 y.o. year old female who sees Geoffry Paradise, MD for primary care. I spoke with  Allison Duffy by phone today.  What matters to the patients health and wellness today?  Patient would like to continue to manage her CHF without complications or hospitalization.     Goals Addressed             This Visit's Progress    To continue to manage CHF without complications or hospitalization   On track    Care Coordination Interventions: Basic overview and discussion of pathophysiology of Heart Failure reviewed Provided education on low sodium diet Reviewed Heart Failure Action Plan in depth and provided written copy Assessed need for readable accurate scales in home Provided education about placing scale on hard, flat surface Advised patient to weigh each morning after emptying bladder Discussed importance of daily weight and advised patient to weigh and record daily Reviewed role of diuretics in prevention of fluid overload and management of heart failure; Discussed the importance of keeping all appointments with provider      COMPLETED: To lower A1c       Care Coordination Interventions: Evaluation of current treatment plan related to type 2 diabetes mellitus and patient's adherence to plan as established by provider Review of patient status, including review of consultants reports, relevant laboratory and other test results, and medications completed Counseled on Diabetic diet, my plate method, use portion control Positive reinforcement given to patient for making efforts to improve her diabetes Lab Results  Component Value Date   HGBA1C 5.9  11/24/2022       Interventions Today    Flowsheet Row Most Recent Value  Chronic Disease   Chronic disease during today's visit Atrial Fibrillation (AFib), Diabetes, Congestive Heart Failure (CHF)   General Interventions   General Interventions Discussed/Reviewed General Interventions Discussed, General Interventions Reviewed, Labs, Doctor Visits  Doctor Visits Discussed/Reviewed PCP, Doctor Visits Reviewed, Doctor Visits Discussed, Specialist  Education Interventions   Education Provided Provided Education  Provided Verbal Education On Nutrition, Labs, When to see the doctor  Labs Reviewed Hgb A1c, Kidney Function  Nutrition Interventions   Nutrition Discussed/Reviewed Nutrition Discussed, Nutrition Reviewed, Fluid intake  Pharmacy Interventions   Pharmacy Dicussed/Reviewed Pharmacy Topics Reviewed, Pharmacy Topics Discussed          SDOH assessments and interventions completed:  No     Care Coordination Interventions:  Yes, provided   Follow up plan: Follow up call scheduled for 03/23/23 @1 :00 PM    Encounter Outcome:  Patient Visit Completed

## 2022-12-08 NOTE — Patient Instructions (Signed)
Visit Information  Thank you for taking time to visit with me today. Please don't hesitate to contact me if I can be of assistance to you.   Following are the goals we discussed today:   Goals Addressed             This Visit's Progress    To continue to manage CHF without complications or hospitalization   On track    Care Coordination Interventions: Basic overview and discussion of pathophysiology of Heart Failure reviewed Provided education on low sodium diet Reviewed Heart Failure Action Plan in depth and provided written copy Assessed need for readable accurate scales in home Provided education about placing scale on hard, flat surface Advised patient to weigh each morning after emptying bladder Discussed importance of daily weight and advised patient to weigh and record daily Reviewed role of diuretics in prevention of fluid overload and management of heart failure; Discussed the importance of keeping all appointments with provider      COMPLETED: To lower A1c       Care Coordination Interventions: Evaluation of current treatment plan related to type 2 diabetes mellitus and patient's adherence to plan as established by provider Review of patient status, including review of consultants reports, relevant laboratory and other test results, and medications completed Counseled on Diabetic diet, my plate method, use portion control Positive reinforcement given to patient for making efforts to improve her diabetes Lab Results  Component Value Date   HGBA1C 5.9  11/24/2022           Our next appointment is by telephone on 03/23/23 at 1:00 PM  Please call the care guide team at [phone] if you need to cancel or reschedule your appointment.   If you are experiencing a Mental Health or Behavioral Health Crisis or need someone to talk to, please call 1-800-273-TALK (toll free, 24 hour hotline)  Patient verbalizes understanding of instructions and care plan provided today  and agrees to view in MyChart. Active MyChart status and patient understanding of how to access instructions and care plan via MyChart confirmed with patient.     Delsa Sale RN BSN CCM [place]  [facility], [facility] Health Nurse Care Coordinator  Direct Dial: [phone] Website: [email or web address]

## 2022-12-23 DIAGNOSIS — H5052 Exophoria: Secondary | ICD-10-CM | POA: Diagnosis not present

## 2022-12-23 DIAGNOSIS — Z961 Presence of intraocular lens: Secondary | ICD-10-CM | POA: Diagnosis not present

## 2022-12-23 DIAGNOSIS — H04123 Dry eye syndrome of bilateral lacrimal glands: Secondary | ICD-10-CM | POA: Diagnosis not present

## 2022-12-23 DIAGNOSIS — H353131 Nonexudative age-related macular degeneration, bilateral, early dry stage: Secondary | ICD-10-CM | POA: Diagnosis not present

## 2022-12-26 ENCOUNTER — Other Ambulatory Visit: Payer: Self-pay | Admitting: Cardiovascular Disease

## 2022-12-29 ENCOUNTER — Telehealth: Payer: Self-pay | Admitting: Cardiovascular Disease

## 2022-12-29 ENCOUNTER — Other Ambulatory Visit: Payer: Self-pay | Admitting: Cardiovascular Disease

## 2022-12-29 MED ORDER — CLONIDINE HCL 0.1 MG PO TABS: ORAL_TABLET | ORAL | 1 refills | Status: DC

## 2022-12-29 NOTE — Telephone Encounter (Signed)
Rx was sent to pts pharmacy as requested.

## 2022-12-29 NOTE — Telephone Encounter (Signed)
*  STAT* If patient is at the pharmacy, call can be transferred to refill team.   1. Which medications need to be refilled? (please list name of each medication and dose if known)   cloNIDine (CATAPRES) 0.1 MG tablet   2. Which pharmacy/location (including street and city if local pharmacy) is medication to be sent to? [id number] [place], [place] - [address] AT [facility] [address] Phone: [phone]  Fax: [phone]   3. Do they need a 30 day or 90 day supply? 90

## 2022-12-29 NOTE — Telephone Encounter (Signed)
Rf request already responded to

## 2023-01-19 DIAGNOSIS — H6990 Unspecified Eustachian tube disorder, unspecified ear: Secondary | ICD-10-CM | POA: Diagnosis not present

## 2023-01-19 DIAGNOSIS — R051 Acute cough: Secondary | ICD-10-CM | POA: Diagnosis not present

## 2023-01-19 DIAGNOSIS — H938X3 Other specified disorders of ear, bilateral: Secondary | ICD-10-CM | POA: Diagnosis not present

## 2023-03-23 ENCOUNTER — Emergency Department (HOSPITAL_COMMUNITY): Payer: Medicare Other

## 2023-03-23 ENCOUNTER — Encounter (HOSPITAL_COMMUNITY): Payer: Self-pay

## 2023-03-23 ENCOUNTER — Inpatient Hospital Stay (HOSPITAL_COMMUNITY)
Admission: EM | Admit: 2023-03-23 | Discharge: 2023-03-30 | DRG: 483 | Disposition: A | Discharge status: Skilled Nursing Facility | Payer: Medicare Other | Attending: Internal Medicine | Admitting: Internal Medicine

## 2023-03-23 ENCOUNTER — Other Ambulatory Visit: Payer: Self-pay

## 2023-03-23 ENCOUNTER — Ambulatory Visit: Payer: Self-pay

## 2023-03-23 DIAGNOSIS — I480 Paroxysmal atrial fibrillation: Secondary | ICD-10-CM | POA: Diagnosis not present

## 2023-03-23 DIAGNOSIS — I5042 Chronic combined systolic (congestive) and diastolic (congestive) heart failure: Secondary | ICD-10-CM | POA: Diagnosis not present

## 2023-03-23 DIAGNOSIS — G8918 Other acute postprocedural pain: Secondary | ICD-10-CM | POA: Diagnosis not present

## 2023-03-23 DIAGNOSIS — R011 Cardiac murmur, unspecified: Secondary | ICD-10-CM | POA: Diagnosis not present

## 2023-03-23 DIAGNOSIS — S42232A 3-part fracture of surgical neck of left humerus, initial encounter for closed fracture: Principal | ICD-10-CM | POA: Diagnosis present

## 2023-03-23 DIAGNOSIS — M25512 Pain in left shoulder: Secondary | ICD-10-CM | POA: Diagnosis not present

## 2023-03-23 DIAGNOSIS — N1832 Chronic kidney disease, stage 3b: Secondary | ICD-10-CM | POA: Diagnosis not present

## 2023-03-23 DIAGNOSIS — S52592A Other fractures of lower end of left radius, initial encounter for closed fracture: Secondary | ICD-10-CM | POA: Diagnosis not present

## 2023-03-23 DIAGNOSIS — Y92007 Garden or yard of unspecified non-institutional (private) residence as the place of occurrence of the external cause: Secondary | ICD-10-CM

## 2023-03-23 DIAGNOSIS — W172XXA Fall into hole, initial encounter: Secondary | ICD-10-CM | POA: Diagnosis present

## 2023-03-23 DIAGNOSIS — E1122 Type 2 diabetes mellitus with diabetic chronic kidney disease: Secondary | ICD-10-CM | POA: Diagnosis present

## 2023-03-23 DIAGNOSIS — E876 Hypokalemia: Secondary | ICD-10-CM | POA: Diagnosis present

## 2023-03-23 DIAGNOSIS — E559 Vitamin D deficiency, unspecified: Secondary | ICD-10-CM | POA: Diagnosis not present

## 2023-03-23 DIAGNOSIS — W19XXXD Unspecified fall, subsequent encounter: Secondary | ICD-10-CM | POA: Diagnosis not present

## 2023-03-23 DIAGNOSIS — Z90710 Acquired absence of both cervix and uterus: Secondary | ICD-10-CM

## 2023-03-23 DIAGNOSIS — R531 Weakness: Secondary | ICD-10-CM | POA: Diagnosis not present

## 2023-03-23 DIAGNOSIS — S52502A Unspecified fracture of the lower end of left radius, initial encounter for closed fracture: Secondary | ICD-10-CM | POA: Diagnosis present

## 2023-03-23 DIAGNOSIS — S4992XA Unspecified injury of left shoulder and upper arm, initial encounter: Secondary | ICD-10-CM | POA: Diagnosis not present

## 2023-03-23 DIAGNOSIS — Z888 Allergy status to other drugs, medicaments and biological substances status: Secondary | ICD-10-CM

## 2023-03-23 DIAGNOSIS — M19012 Primary osteoarthritis, left shoulder: Secondary | ICD-10-CM | POA: Diagnosis present

## 2023-03-23 DIAGNOSIS — S0990XA Unspecified injury of head, initial encounter: Secondary | ICD-10-CM | POA: Diagnosis not present

## 2023-03-23 DIAGNOSIS — W19XXXA Unspecified fall, initial encounter: Principal | ICD-10-CM

## 2023-03-23 DIAGNOSIS — Z79899 Other long term (current) drug therapy: Secondary | ICD-10-CM

## 2023-03-23 DIAGNOSIS — Z8249 Family history of ischemic heart disease and other diseases of the circulatory system: Secondary | ICD-10-CM | POA: Diagnosis not present

## 2023-03-23 DIAGNOSIS — D62 Acute posthemorrhagic anemia: Secondary | ICD-10-CM | POA: Diagnosis not present

## 2023-03-23 DIAGNOSIS — Z833 Family history of diabetes mellitus: Secondary | ICD-10-CM | POA: Diagnosis not present

## 2023-03-23 DIAGNOSIS — I080 Rheumatic disorders of both mitral and aortic valves: Secondary | ICD-10-CM | POA: Diagnosis present

## 2023-03-23 DIAGNOSIS — E785 Hyperlipidemia, unspecified: Secondary | ICD-10-CM | POA: Diagnosis present

## 2023-03-23 DIAGNOSIS — Z043 Encounter for examination and observation following other accident: Secondary | ICD-10-CM | POA: Diagnosis not present

## 2023-03-23 DIAGNOSIS — S62102A Fracture of unspecified carpal bone, left wrist, initial encounter for closed fracture: Secondary | ICD-10-CM

## 2023-03-23 DIAGNOSIS — S199XXA Unspecified injury of neck, initial encounter: Secondary | ICD-10-CM | POA: Diagnosis not present

## 2023-03-23 DIAGNOSIS — E7800 Pure hypercholesterolemia, unspecified: Secondary | ICD-10-CM | POA: Diagnosis not present

## 2023-03-23 DIAGNOSIS — Z9841 Cataract extraction status, right eye: Secondary | ICD-10-CM

## 2023-03-23 DIAGNOSIS — I2510 Atherosclerotic heart disease of native coronary artery without angina pectoris: Secondary | ICD-10-CM | POA: Diagnosis not present

## 2023-03-23 DIAGNOSIS — M25532 Pain in left wrist: Secondary | ICD-10-CM | POA: Diagnosis not present

## 2023-03-23 DIAGNOSIS — S4292XA Fracture of left shoulder girdle, part unspecified, initial encounter for closed fracture: Secondary | ICD-10-CM

## 2023-03-23 DIAGNOSIS — Z882 Allergy status to sulfonamides status: Secondary | ICD-10-CM

## 2023-03-23 DIAGNOSIS — Z7984 Long term (current) use of oral hypoglycemic drugs: Secondary | ICD-10-CM

## 2023-03-23 DIAGNOSIS — Z961 Presence of intraocular lens: Secondary | ICD-10-CM | POA: Diagnosis present

## 2023-03-23 DIAGNOSIS — I10 Essential (primary) hypertension: Secondary | ICD-10-CM | POA: Diagnosis not present

## 2023-03-23 DIAGNOSIS — R52 Pain, unspecified: Secondary | ICD-10-CM | POA: Diagnosis not present

## 2023-03-23 DIAGNOSIS — E1165 Type 2 diabetes mellitus with hyperglycemia: Secondary | ICD-10-CM | POA: Diagnosis not present

## 2023-03-23 DIAGNOSIS — Z7901 Long term (current) use of anticoagulants: Secondary | ICD-10-CM

## 2023-03-23 DIAGNOSIS — R0989 Other specified symptoms and signs involving the circulatory and respiratory systems: Secondary | ICD-10-CM | POA: Diagnosis not present

## 2023-03-23 DIAGNOSIS — S42292A Other displaced fracture of upper end of left humerus, initial encounter for closed fracture: Secondary | ICD-10-CM | POA: Diagnosis not present

## 2023-03-23 DIAGNOSIS — R079 Chest pain, unspecified: Secondary | ICD-10-CM | POA: Diagnosis not present

## 2023-03-23 DIAGNOSIS — M778 Other enthesopathies, not elsewhere classified: Secondary | ICD-10-CM | POA: Diagnosis not present

## 2023-03-23 DIAGNOSIS — I130 Hypertensive heart and chronic kidney disease with heart failure and stage 1 through stage 4 chronic kidney disease, or unspecified chronic kidney disease: Secondary | ICD-10-CM | POA: Diagnosis present

## 2023-03-23 DIAGNOSIS — M25412 Effusion, left shoulder: Secondary | ICD-10-CM | POA: Diagnosis not present

## 2023-03-23 DIAGNOSIS — S62102D Fracture of unspecified carpal bone, left wrist, subsequent encounter for fracture with routine healing: Secondary | ICD-10-CM | POA: Diagnosis not present

## 2023-03-23 DIAGNOSIS — S4292XD Fracture of left shoulder girdle, part unspecified, subsequent encounter for fracture with routine healing: Secondary | ICD-10-CM | POA: Diagnosis not present

## 2023-03-23 DIAGNOSIS — S52572A Other intraarticular fracture of lower end of left radius, initial encounter for closed fracture: Secondary | ICD-10-CM | POA: Diagnosis not present

## 2023-03-23 DIAGNOSIS — Z4889 Encounter for other specified surgical aftercare: Secondary | ICD-10-CM | POA: Diagnosis not present

## 2023-03-23 DIAGNOSIS — I6782 Cerebral ischemia: Secondary | ICD-10-CM | POA: Diagnosis not present

## 2023-03-23 DIAGNOSIS — I5033 Acute on chronic diastolic (congestive) heart failure: Secondary | ICD-10-CM | POA: Diagnosis not present

## 2023-03-23 DIAGNOSIS — I5043 Acute on chronic combined systolic (congestive) and diastolic (congestive) heart failure: Secondary | ICD-10-CM | POA: Diagnosis not present

## 2023-03-23 DIAGNOSIS — Z7401 Bed confinement status: Secondary | ICD-10-CM | POA: Diagnosis not present

## 2023-03-23 DIAGNOSIS — I110 Hypertensive heart disease with heart failure: Secondary | ICD-10-CM | POA: Diagnosis not present

## 2023-03-23 DIAGNOSIS — S42202A Unspecified fracture of upper end of left humerus, initial encounter for closed fracture: Secondary | ICD-10-CM | POA: Diagnosis not present

## 2023-03-23 DIAGNOSIS — S4982XA Other specified injuries of left shoulder and upper arm, initial encounter: Secondary | ICD-10-CM | POA: Diagnosis not present

## 2023-03-23 DIAGNOSIS — N179 Acute kidney failure, unspecified: Secondary | ICD-10-CM | POA: Diagnosis not present

## 2023-03-23 DIAGNOSIS — H04123 Dry eye syndrome of bilateral lacrimal glands: Secondary | ICD-10-CM | POA: Diagnosis not present

## 2023-03-23 DIAGNOSIS — R9089 Other abnormal findings on diagnostic imaging of central nervous system: Secondary | ICD-10-CM | POA: Diagnosis not present

## 2023-03-23 DIAGNOSIS — R000 Tachycardia, unspecified: Secondary | ICD-10-CM | POA: Diagnosis not present

## 2023-03-23 DIAGNOSIS — T380X5A Adverse effect of glucocorticoids and synthetic analogues, initial encounter: Secondary | ICD-10-CM | POA: Diagnosis not present

## 2023-03-23 DIAGNOSIS — Z87891 Personal history of nicotine dependence: Secondary | ICD-10-CM

## 2023-03-23 DIAGNOSIS — I2583 Coronary atherosclerosis due to lipid rich plaque: Secondary | ICD-10-CM | POA: Diagnosis not present

## 2023-03-23 DIAGNOSIS — M160 Bilateral primary osteoarthritis of hip: Secondary | ICD-10-CM | POA: Diagnosis not present

## 2023-03-23 DIAGNOSIS — I4891 Unspecified atrial fibrillation: Secondary | ICD-10-CM | POA: Diagnosis not present

## 2023-03-23 DIAGNOSIS — Z885 Allergy status to narcotic agent status: Secondary | ICD-10-CM

## 2023-03-23 DIAGNOSIS — S42352A Displaced comminuted fracture of shaft of humerus, left arm, initial encounter for closed fracture: Secondary | ICD-10-CM | POA: Diagnosis not present

## 2023-03-23 DIAGNOSIS — I700 Atherosclerosis of aorta: Secondary | ICD-10-CM | POA: Diagnosis not present

## 2023-03-23 DIAGNOSIS — I340 Nonrheumatic mitral (valve) insufficiency: Secondary | ICD-10-CM | POA: Diagnosis not present

## 2023-03-23 DIAGNOSIS — Z01818 Encounter for other preprocedural examination: Secondary | ICD-10-CM | POA: Diagnosis not present

## 2023-03-23 DIAGNOSIS — Z9842 Cataract extraction status, left eye: Secondary | ICD-10-CM

## 2023-03-23 DIAGNOSIS — K219 Gastro-esophageal reflux disease without esophagitis: Secondary | ICD-10-CM | POA: Diagnosis not present

## 2023-03-23 DIAGNOSIS — R9389 Abnormal findings on diagnostic imaging of other specified body structures: Secondary | ICD-10-CM | POA: Diagnosis not present

## 2023-03-23 DIAGNOSIS — Z9049 Acquired absence of other specified parts of digestive tract: Secondary | ICD-10-CM

## 2023-03-23 DIAGNOSIS — E118 Type 2 diabetes mellitus with unspecified complications: Secondary | ICD-10-CM | POA: Diagnosis not present

## 2023-03-23 DIAGNOSIS — D649 Anemia, unspecified: Secondary | ICD-10-CM | POA: Diagnosis not present

## 2023-03-23 DIAGNOSIS — S52612A Displaced fracture of left ulna styloid process, initial encounter for closed fracture: Secondary | ICD-10-CM | POA: Diagnosis not present

## 2023-03-23 LAB — CBC
HCT: 34 % — ABNORMAL LOW (ref 36.0–46.0)
Hemoglobin: 12.2 g/dL (ref 12.0–15.0)
MCH: 33.5 pg (ref 26.0–34.0)
MCHC: 35.9 g/dL (ref 30.0–36.0)
MCV: 93.4 fL (ref 80.0–100.0)
Platelets: 222 10*3/uL (ref 150–400)
RBC: 3.64 MIL/uL — ABNORMAL LOW (ref 3.87–5.11)
RDW: 11.9 % (ref 11.5–15.5)
WBC: 10.1 10*3/uL (ref 4.0–10.5)
nRBC: 0 % (ref 0.0–0.2)

## 2023-03-23 LAB — BASIC METABOLIC PANEL
Anion gap: 13 (ref 5–15)
BUN: 30 mg/dL — ABNORMAL HIGH (ref 8–23)
CO2: 20 mmol/L — ABNORMAL LOW (ref 22–32)
Calcium: 9.4 mg/dL (ref 8.9–10.3)
Chloride: 103 mmol/L (ref 98–111)
Creatinine, Ser: 1.4 mg/dL — ABNORMAL HIGH (ref 0.44–1.00)
GFR, Estimated: 35 mL/min — ABNORMAL LOW (ref >60)
Glucose, Bld: 272 mg/dL — ABNORMAL HIGH (ref 70–99)
Potassium: 3.3 mmol/L — ABNORMAL LOW (ref 3.5–5.1)
Sodium: 136 mmol/L (ref 135–145)

## 2023-03-23 MED ORDER — HYDROMORPHONE HCL 1 MG/ML IJ SOLN
0.5000 mg | Freq: Once | INTRAMUSCULAR | Status: AC
  Administered 2023-03-23: 0.5 mg via INTRAVENOUS
  Filled 2023-03-23: qty 0.5

## 2023-03-23 MED ORDER — HYDRALAZINE HCL 25 MG PO TABS
37.5000 mg | ORAL_TABLET | Freq: Three times a day (TID) | ORAL | Status: DC
  Administered 2023-03-24 – 2023-03-30 (×18): 37.5 mg via ORAL
  Filled 2023-03-23 (×19): qty 2

## 2023-03-23 MED ORDER — IRBESARTAN 300 MG PO TABS
300.0000 mg | ORAL_TABLET | Freq: Every day | ORAL | Status: DC
  Administered 2023-03-24 – 2023-03-25 (×2): 300 mg via ORAL
  Filled 2023-03-23 (×2): qty 1

## 2023-03-23 MED ORDER — AMLODIPINE BESYLATE 10 MG PO TABS
10.0000 mg | ORAL_TABLET | Freq: Every day | ORAL | Status: DC
  Administered 2023-03-24 – 2023-03-29 (×7): 10 mg via ORAL
  Filled 2023-03-23 (×7): qty 1

## 2023-03-23 MED ORDER — ACETAMINOPHEN 325 MG PO TABS: 650.0000 mg | ORAL_TABLET | Freq: Four times a day (QID) | ORAL | Status: DC | PRN

## 2023-03-23 MED ORDER — HYDROMORPHONE HCL 2 MG PO TABS
1.0000 mg | ORAL_TABLET | ORAL | Status: DC | PRN
  Administered 2023-03-24 – 2023-03-30 (×13): 1 mg via ORAL
  Filled 2023-03-23 (×13): qty 1

## 2023-03-23 MED ORDER — POLYVINYL ALCOHOL 1.4 % OP SOLN
1.0000 [drp] | Freq: Two times a day (BID) | OPHTHALMIC | Status: DC
  Administered 2023-03-24 – 2023-03-30 (×13): 1 [drp] via OPHTHALMIC
  Filled 2023-03-23 (×2): qty 15

## 2023-03-23 MED ORDER — PROCHLORPERAZINE EDISYLATE 10 MG/2ML IJ SOLN
2.5000 mg | Freq: Four times a day (QID) | INTRAMUSCULAR | Status: DC | PRN
  Administered 2023-03-24: 2.5 mg via INTRAVENOUS
  Filled 2023-03-23: qty 2

## 2023-03-23 MED ORDER — ONDANSETRON HCL 4 MG/2ML IJ SOLN
4.0000 mg | Freq: Once | INTRAMUSCULAR | Status: AC
  Administered 2023-03-23: 4 mg via INTRAVENOUS
  Filled 2023-03-23: qty 2

## 2023-03-23 MED ORDER — POTASSIUM CHLORIDE 10 MEQ/100ML IV SOLN
10.0000 meq | INTRAVENOUS | Status: AC
Start: 2023-03-23 — End: 2023-03-24
  Administered 2023-03-23 – 2023-03-24 (×3): 10 meq via INTRAVENOUS
  Filled 2023-03-23 (×3): qty 100

## 2023-03-23 MED ORDER — FENTANYL CITRATE PF 50 MCG/ML IJ SOSY
50.0000 ug | PREFILLED_SYRINGE | Freq: Once | INTRAMUSCULAR | Status: AC
  Administered 2023-03-23: 50 ug via INTRAVENOUS
  Filled 2023-03-23: qty 1

## 2023-03-23 MED ORDER — RISAQUAD PO CAPS
1.0000 | ORAL_CAPSULE | Freq: Every day | ORAL | Status: DC
  Administered 2023-03-24 – 2023-03-29 (×7): 1 via ORAL
  Filled 2023-03-23 (×7): qty 1

## 2023-03-23 MED ORDER — ONDANSETRON HCL 4 MG/2ML IJ SOLN
4.0000 mg | Freq: Once | INTRAMUSCULAR | Status: AC
  Administered 2023-03-23: 4 mg via INTRAVENOUS

## 2023-03-23 MED ORDER — ISOSORBIDE DINITRATE 20 MG PO TABS
20.0000 mg | ORAL_TABLET | Freq: Three times a day (TID) | ORAL | Status: DC
  Administered 2023-03-24 – 2023-03-30 (×18): 20 mg via ORAL
  Filled 2023-03-23 (×21): qty 1

## 2023-03-23 MED ORDER — CLONIDINE HCL 0.1 MG PO TABS
0.1000 mg | ORAL_TABLET | Freq: Two times a day (BID) | ORAL | Status: DC
  Administered 2023-03-24 – 2023-03-26 (×6): 0.1 mg via ORAL
  Filled 2023-03-23 (×6): qty 1

## 2023-03-23 MED ORDER — FENTANYL CITRATE PF 50 MCG/ML IJ SOSY
75.0000 ug | PREFILLED_SYRINGE | Freq: Once | INTRAMUSCULAR | Status: DC
  Filled 2023-03-23: qty 2

## 2023-03-23 MED ORDER — ACETAMINOPHEN 325 MG PO TABS
650.0000 mg | ORAL_TABLET | Freq: Once | ORAL | Status: AC
  Administered 2023-03-24: 650 mg via ORAL
  Filled 2023-03-23: qty 2

## 2023-03-23 MED ORDER — DOCUSATE SODIUM 100 MG PO CAPS
100.0000 mg | ORAL_CAPSULE | Freq: Two times a day (BID) | ORAL | Status: DC
  Administered 2023-03-23 – 2023-03-27 (×9): 100 mg via ORAL
  Filled 2023-03-23 (×9): qty 1

## 2023-03-23 MED ORDER — RANOLAZINE ER 500 MG PO TB12
500.0000 mg | ORAL_TABLET | Freq: Two times a day (BID) | ORAL | Status: DC
Start: 2023-03-24 — End: 2023-03-25
  Administered 2023-03-24 – 2023-03-25 (×3): 500 mg via ORAL
  Filled 2023-03-23 (×3): qty 1

## 2023-03-23 MED ORDER — LINAGLIPTIN 5 MG PO TABS
5.0000 mg | ORAL_TABLET | Freq: Every day | ORAL | Status: DC
  Administered 2023-03-24 – 2023-03-30 (×7): 5 mg via ORAL
  Filled 2023-03-23 (×7): qty 1

## 2023-03-23 MED ORDER — HYDROMORPHONE HCL 1 MG/ML IJ SOLN
0.2500 mg | INTRAMUSCULAR | Status: DC | PRN
  Administered 2023-03-24 (×2): 0.25 mg via INTRAVENOUS
  Filled 2023-03-23 (×2): qty 0.5

## 2023-03-23 MED ORDER — HYDROMORPHONE HCL 1 MG/ML IJ SOLN
0.5000 mg | INTRAMUSCULAR | Status: DC | PRN
  Administered 2023-03-23: 0.5 mg via INTRAVENOUS
  Filled 2023-03-23: qty 1

## 2023-03-23 MED ORDER — INSULIN ASPART 100 UNIT/ML IJ SOLN
0.0000 [IU] | Freq: Three times a day (TID) | INTRAMUSCULAR | Status: DC
  Administered 2023-03-24: 6 [IU] via SUBCUTANEOUS
  Administered 2023-03-24: 2 [IU] via SUBCUTANEOUS
  Administered 2023-03-24: 5 [IU] via SUBCUTANEOUS
  Administered 2023-03-25 – 2023-03-26 (×4): 3 [IU] via SUBCUTANEOUS
  Administered 2023-03-26: 4 [IU] via SUBCUTANEOUS

## 2023-03-23 MED ORDER — ONDANSETRON HCL 4 MG/2ML IJ SOLN
4.0000 mg | Freq: Once | INTRAMUSCULAR | Status: DC | PRN
  Filled 2023-03-23: qty 2

## 2023-03-23 MED ORDER — FENTANYL CITRATE PF 50 MCG/ML IJ SOSY
50.0000 ug | PREFILLED_SYRINGE | INTRAMUSCULAR | Status: AC | PRN
  Administered 2023-03-23 (×2): 50 ug via INTRAVENOUS
  Filled 2023-03-23 (×2): qty 1

## 2023-03-23 MED ORDER — PROMETHAZINE HCL 25 MG/ML IJ SOLN: 12.5000 mg | Freq: Once | INTRAMUSCULAR | Status: DC

## 2023-03-23 MED ORDER — ONDANSETRON HCL 4 MG/2ML IJ SOLN
4.0000 mg | Freq: Once | INTRAMUSCULAR | Status: DC
  Filled 2023-03-23: qty 2

## 2023-03-23 MED ORDER — PROCHLORPERAZINE EDISYLATE 10 MG/2ML IJ SOLN
5.0000 mg | Freq: Once | INTRAMUSCULAR | Status: AC
  Administered 2023-03-23: 5 mg via INTRAVENOUS
  Filled 2023-03-23: qty 2

## 2023-03-23 MED ORDER — ACETAMINOPHEN 650 MG RE SUPP: 650.0000 mg | Freq: Four times a day (QID) | RECTAL | Status: DC | PRN

## 2023-03-23 MED ORDER — ROSUVASTATIN CALCIUM 20 MG PO TABS
20.0000 mg | ORAL_TABLET | Freq: Every day | ORAL | Status: DC
  Administered 2023-03-24 – 2023-03-25 (×3): 20 mg via ORAL
  Filled 2023-03-23 (×3): qty 1

## 2023-03-23 MED ORDER — PANTOPRAZOLE SODIUM 40 MG PO TBEC
40.0000 mg | DELAYED_RELEASE_TABLET | Freq: Every day | ORAL | Status: DC
  Administered 2023-03-24 – 2023-03-30 (×7): 40 mg via ORAL
  Filled 2023-03-23 (×7): qty 1

## 2023-03-23 NOTE — H&P (Signed)
 History and Physical    Patient: Allison Duffy:[id number] DOB: [birth year] DOA: 03/23/2023 DOS: the patient was seen and examined on 03/23/2023 PCP: Geoffry Paradise, MD  Patient coming from: Home  Chief Complaint: No chief complaint on file.  HPI: Allison Duffy is a 89 y.o. female with medical history significant for paroxysmal atrial fibrillation on Eliquis, type 2 diabetes mellitus, hypertension, and coronary artery disease who is independent and lives alone.  She was doing yard work and stepped in a hole which caused he to fall.  She was trying not to hit her head but her left side banged on the side of her porch.  The patient came in by EMS.  Her workup in the emergency department included multiple x-rays and she was found to have a left shoulder and left wrist fracture.  Ortho was called and recommended splinting with outpatient follow-up.  The patient received fentanyl in the emergency department and still had 10 out of 10 pain.  She was moaning and got nauseated.  They are asked to admit the patient for pain control.  A splint was placed in the ED.   Review of Systems: As mentioned in the history of present illness. All other systems reviewed and are negative. Past Medical History:  Diagnosis Date   Acute on chronic combined systolic and diastolic CHF (congestive heart failure) (HCC) 01/28/2019   Arthritis    "scattered joints" (07/29/2013)   Chest pain    Colitis    Gout attack ?1980's   Hyperlipidemia    Hypertension    PONV (postoperative nausea and vomiting)    Type II diabetes mellitus (HCC)    Past Surgical History:  Procedure Laterality Date   APPENDECTOMY     CARPAL TUNNEL RELEASE Right    CATARACT EXTRACTION W/ INTRAOCULAR LENS  IMPLANT, BILATERAL Bilateral    CHOLECYSTECTOMY     KNEE ARTHROSCOPY Right    LEFT HEART CATH AND CORONARY ANGIOGRAPHY N/A 04/12/2020   Procedure: LEFT HEART CATH AND CORONARY ANGIOGRAPHY;  Surgeon: Runell Gess, MD;  Location: [facility]  INVASIVE CV LAB;  Service: Cardiovascular;  Laterality: N/A;   OLECRANON BURSECTOMY Left 07/29/2013   "in ER"   PARATHYROIDECTOMY     TONSILLECTOMY     TOTAL SHOULDER REPLACEMENT  2017   VAGINAL HYSTERECTOMY     Social History:  reports that she quit smoking about 69 years ago. Her smoking use included cigarettes. She started smoking about 74 years ago. She does not have any smokeless tobacco history on file. She reports that she does not drink alcohol and does not use drugs.  Allergies  Allergen Reactions   Codeine Nausea And Vomiting   Hydrocodone     Nausea and vomiting   Prednisone     Other reaction(s): sick on her stomach   Sulfa Antibiotics Itching, Nausea Only and Other (See Comments)    Family History  Problem Relation Age of Onset   Hypertension Father    Heart disease Father    Heart disease Mother    Diabetes Mother    Hypertension Mother    Heart disease Brother        both brothers   Diabetes Brother    Hypertension Brother     Prior to Admission medications   Medication Sig Start Date End Date Taking? Authorizing Provider  amLODipine (NORVASC) 10 MG tablet Take 10 mg by mouth at bedtime.    [provider]  apixaban (ELIQUIS) 5 MG TABS tablet TAKE 1  TABLET BY MOUTH TWICE DAILY 08/05/22   Runell Gess, MD  carboxymethylcellulose 1 % ophthalmic solution Place 1 drop into both eyes 2 (two) times daily.    [provider]  carvedilol (COREG) 12.5 MG tablet Take 12.5 mg by mouth 2 (two) times daily. 08/01/22   [provider]  cholecalciferol (VITAMIN D) 1000 UNITS tablet Take 1,000 Units by mouth daily with lunch.    [provider]  cholestyramine (QUESTRAN) 4 g packet Take 4 g by mouth daily as needed (IBS).    [provider]  cloNIDine (CATAPRES) 0.1 MG tablet TAKE 2 TABLETS BY MOUTH EVERY MORNING AND TAKE 1 TABLET BY MOUTH EVERY NIGHT AT BEDTIME 12/29/22   Runell Gess, MD  furosemide (LASIX) 20 MG tablet Take 1  tablet (20 mg total) by mouth daily. 08/27/22 03/23/23  Ronney Asters, NP  hydrALAZINE (APRESOLINE) 25 MG tablet TAKE 1 1/2 TABLETS(37.5 MG) THREE TIMES DAILY 11/11/22   Runell Gess, MD  isosorbide dinitrate (ISORDIL) 20 MG tablet TAKE 1 TABLET BY MOUTH THREE TIMES DAILY. TAKE WITH HYDRALAZINE 37.5MG (1 AND A 1/2 TABLETS) Patient taking differently: Take 20 mg by mouth 3 (three) times daily. Take 1 tablet by mouth 3 times daily with Hydralazine 37.5mg  (1.5 tablets). 11/11/22   Runell Gess, MD  Lancets [facility] ULTRASOFT) lancets use to check blood sugar twice a day as directed 12/18/11   [provider]  metFORMIN (GLUCOPHAGE) 500 MG tablet Take 500 mg by mouth once.    [provider]  Multiple Vitamin (MULTIVITAMIN WITH MINERALS) TABS Take 1 tablet by mouth daily with supper.    [provider]  nitroGLYCERIN (NITROSTAT) 0.4 MG SL tablet PLACE 1 TABLET (0.4 MG TOTAL) UNDER THE TONGUE EVERY FIVE MINUTES X 3 DOSES AS NEEDED FOR CHEST PAIN. Patient taking differently: Place 0.4 mg under the tongue every 5 (five) minutes as needed for chest pain. 04/13/20 03/23/23  Marykay Lex, MD  olmesartan (BENICAR) 40 MG tablet Take 40 mg by mouth at bedtime.    Geoffry Paradise, MD  omeprazole (PRILOSEC) 40 MG capsule TAKE 1 CAPSULE BY MOUTH DAILY WITH SUPPER Patient taking differently: Take 40 mg by mouth daily. 01/15/22   Runell Gess, MD  [facility] ULTRA test strip 1 each by Other route as needed for other. 06/29/19   [provider]  potassium chloride (KLOR-CON) 10 MEQ tablet TAKE 1 TABLET(10 MEQ) BY MOUTH DAILY-- MAY TAKE AN EXTRA WITH A WEIGHT INCREASE OF 2LBS OVERNIGHT OR 5 LBS TOTAL AS DIRECTED Patient taking differently: Take 10 mEq by mouth daily. Take 1 tablet by mouth daily. May take an extra tablet with weight increase of 2 lbs overnight or 5 lbs in total. 11/11/22   Runell Gess, MD  Probiotic Product (PRO-BIOTIC BLEND) CAPS Take 1  capsule by mouth at bedtime. Takes OTC probiotic    Geoffry Paradise, MD  ranolazine (RANEXA) 500 MG 12 hr tablet TAKE 1 TABLET(500 MG) BY MOUTH TWICE DAILY Patient taking differently: Take 500 mg by mouth 2 (two) times daily. TAKE 1 TABLET(500 MG) BY MOUTH TWICE DAILY 11/11/22   Runell Gess, MD  rosuvastatin (CRESTOR) 20 MG tablet TAKE 1 TABLET(20 MG) BY MOUTH AT BEDTIME Patient taking differently: Take 20 mg by mouth at bedtime. 11/11/22   Runell Gess, MD  sitaGLIPtin (JANUVIA) 100 MG tablet Take 100 mg by mouth daily with lunch.    [provider]    Physical Exam:  Vitals:   03/23/23 2030 03/23/23 2045 03/23/23 2100 03/23/23 2115  BP: (!) 185/86 (!) 187/91 (!) 176/103 (!) 171/84  Pulse: 99 99 97 (!) 102  Resp: 19 (!) 25 (!) 22 18  Temp:      TempSrc:      SpO2: 100% 100% 99% 99%  Weight:      Height:       Physical Exam:  General: visibly in pain, well developed, well nourished HEENT: Normocephalic, atraumatic, PERRL Cardiovascular: Normal rate and rhythm. Distal pulses intact. Pulmonary: Normal pulmonary effort, normal breath sounds Gastrointestinal: Nondistended abdomen, soft, non-tender, hypoactive bowel sounds Musculoskeletal:left shoulder bruised and swollen, left arm in sling, no lower ext edema Skin: Skin is warm and dry. Neuro: No focal deficits noted, AAOx3. PSYCH: Attentive and cooperative  Data Reviewed:  Results for orders placed or performed during the hospital encounter of 03/23/23 (from the past 24 hours)  CBC     Status: Abnormal   Collection Time: 03/23/23  2:17 PM  Result Value Ref Range   WBC 10.1 4.0 - 10.5 K/uL   RBC 3.64 (L) 3.87 - 5.11 MIL/uL   Hemoglobin 12.2 12.0 - 15.0 g/dL   HCT 29.5 (L) 62.1 - 30.8 %   MCV 93.4 80.0 - 100.0 fL   MCH 33.5 26.0 - 34.0 pg   MCHC 35.9 30.0 - 36.0 g/dL   RDW 65.7 84.6 - 96.2 %   Platelets 222 150 - 400 K/uL   nRBC 0.0 0.0 - 0.2 %  Basic metabolic panel     Status: Abnormal   Collection  Time: 03/23/23  2:17 PM  Result Value Ref Range   Sodium 136 135 - 145 mmol/L   Potassium 3.3 (L) 3.5 - 5.1 mmol/L   Chloride 103 98 - 111 mmol/L   CO2 20 (L) 22 - 32 mmol/L   Glucose, Bld 272 (H) 70 - 99 mg/dL   BUN 30 (H) 8 - 23 mg/dL   Creatinine, Ser 9.52 (H) 0.44 - 1.00 mg/dL   Calcium 9.4 8.9 - 84.1 mg/dL   GFR, Estimated 35 (L) >60 mL/min   Anion gap 13 5 - 15     Assessment and Plan: Left shoulder and wrist fracture with intractable pain - Admit for pain control.  Dilaudid is helping better than her Fentanyl dose.  - Monitoring closely given her advanced age and renal insufficiency. - Continue antiemetics as narcotics make her nauseated - Sling in place. Outpatient Ortho follow up.  2.  History of paroxysmal atrial fibrillation on Eliquis - Holding Eliquis for now.  Resume at discharge.  3. Hypokalemia - Replete.  Check magnesium level.    Advance Care Planning:   Code Status: Full Code the patient was DNR in the past CODE STATUS discussion was deferred as she was in extreme pain.  Full code by default at this time.  Revisit discussion when she can focus.    Consults: ortho called  Family Communication: Patient's daughter is at bedside.  Severity of Illness: The appropriate patient status for this patient is INPATIENT. Inpatient status is judged to be reasonable and necessary in order to provide the required intensity of service to ensure the patient's safety. The patient's presenting symptoms, physical exam findings, and initial radiographic and laboratory data in the context of their chronic comorbidities is felt to place them at high risk for further clinical deterioration. Furthermore, it is not anticipated that the patient will be medically stable for discharge from the hospital within 2  midnights of admission.   * I certify that at the point of admission it is my clinical judgment that the patient will require inpatient hospital care spanning beyond 2 midnights from  the point of admission due to high intensity of service, high risk for further deterioration and high frequency of surveillance required.*  Author: Buena Irish, MD 03/23/2023 9:33 PM  For on call review [email or web address].

## 2023-03-23 NOTE — Progress Notes (Signed)
 Orthopedic Tech Progress Note Patient Details:  Allison Duffy Sep 26, 1926 [id number]  Level 2 trauma   Patient ID: Allison Duffy, female   DOB: 04-18-1926, 89 y.o.   MRN: [id number]  [name] 03/23/2023, 4:30 PM

## 2023-03-23 NOTE — ED Provider Notes (Signed)
 [place] EMERGENCY DEPARTMENT AT [facility] Provider Note   CSN: [id number] Arrival date & time: 03/23/23  1352     History  No chief complaint on file.   Allison Duffy is a 89 y.o. female.  HPI 89 year old female presents today as a level 2 trauma secondary to fall on blood thinners.  Per EMS report, patient was trying to clean out [place] vent outside.  She slipped into a hole and fell landing on her left shoulder, left wrist, and left elbow.  She is complaining of pain in the left shoulder and left wrist.  She denies striking her head or injuring her neck.  EMS transported her to the hospital without a cervical collar in place.  She received 200 mics of fentanyl prehospital.  She is currently nauseated.  She is awake and alert.  She denies any other injuries.  She states she is on blood thinner for a heart problem.     Home Medications Prior to Admission medications   Medication Sig Start Date End Date Taking? Authorizing Provider  amLODipine (NORVASC) 10 MG tablet Take 10 mg by mouth at bedtime.    [provider]  apixaban (ELIQUIS) 5 MG TABS tablet TAKE 1 TABLET BY MOUTH TWICE DAILY 08/05/22   Runell Gess, MD  carboxymethylcellulose 1 % ophthalmic solution Place 1 drop into both eyes 2 (two) times daily.    [provider]  carvedilol (COREG) 12.5 MG tablet Take 12.5 mg by mouth 2 (two) times daily. 08/01/22   [provider]  cholecalciferol (VITAMIN D) 1000 UNITS tablet Take 1,000 Units by mouth daily with lunch.    [provider]  cholestyramine (QUESTRAN) 4 g packet Take 4 g by mouth daily as needed (IBS).    [provider]  cloNIDine (CATAPRES) 0.1 MG tablet TAKE 2 TABLETS BY MOUTH EVERY MORNING AND TAKE 1 TABLET BY MOUTH EVERY NIGHT AT BEDTIME 12/29/22   Runell Gess, MD  furosemide (LASIX) 20 MG tablet Take 1 tablet (20 mg total) by mouth daily. 08/27/22 11/25/22  Ronney Asters, NP  hydrALAZINE  (APRESOLINE) 25 MG tablet TAKE 1 1/2 TABLETS(37.5 MG) THREE TIMES DAILY 11/11/22   Runell Gess, MD  isosorbide dinitrate (ISORDIL) 20 MG tablet TAKE 1 TABLET BY MOUTH THREE TIMES DAILY. TAKE WITH HYDRALAZINE 37.5MG (1 AND A 1/2 TABLETS) 11/11/22   Runell Gess, MD  Lancets [facility] ULTRASOFT) lancets use to check blood sugar twice a day as directed 12/18/11   [provider]  metFORMIN (GLUCOPHAGE) 500 MG tablet Take 500 mg by mouth once.    [provider]  Multiple Vitamin (MULTIVITAMIN WITH MINERALS) TABS Take 1 tablet by mouth daily with supper.    [provider]  nitroGLYCERIN (NITROSTAT) 0.4 MG SL tablet PLACE 1 TABLET (0.4 MG TOTAL) UNDER THE TONGUE EVERY FIVE MINUTES X 3 DOSES AS NEEDED FOR CHEST PAIN. Patient taking differently: Place 0.4 mg under the tongue every 5 (five) minutes as needed for chest pain. 04/13/20 07/26/22  Marykay Lex, MD  olmesartan (BENICAR) 40 MG tablet Take 40 mg by mouth at bedtime.    Geoffry Paradise, MD  omeprazole (PRILOSEC) 40 MG capsule TAKE 1 CAPSULE BY MOUTH DAILY WITH SUPPER Patient taking differently: Take 40 mg by mouth daily. 01/15/22   Runell Gess, MD  [facility] ULTRA test strip 1 each by Other route as needed for other. 06/29/19   [provider]  potassium chloride (KLOR-CON) 10 MEQ  tablet TAKE 1 TABLET(10 MEQ) BY MOUTH DAILY-- MAY TAKE AN EXTRA WITH A WEIGHT INCREASE OF 2LBS OVERNIGHT OR 5 LBS TOTAL AS DIRECTED 11/11/22   Runell Gess, MD  Probiotic Product (PRO-BIOTIC BLEND) CAPS Take 1 capsule by mouth at bedtime. Takes OTC probiotic    Geoffry Paradise, MD  ranolazine (RANEXA) 500 MG 12 hr tablet TAKE 1 TABLET(500 MG) BY MOUTH TWICE DAILY 11/11/22   Runell Gess, MD  rosuvastatin (CRESTOR) 20 MG tablet TAKE 1 TABLET(20 MG) BY MOUTH AT BEDTIME 11/11/22   Runell Gess, MD  sitaGLIPtin (JANUVIA) 100 MG tablet Take 100 mg by mouth daily with lunch.    [provider]       Allergies    Codeine, Hydrocodone, Prednisone, and Sulfa antibiotics    Review of Systems   Review of Systems  Physical Exam Updated Vital Signs BP (!) 198/113   Pulse 95   Temp 97.7 F (36.5 C) (Oral)   Resp (!) 24   Ht 1.676 m (5\' 6" )   Wt 68.5 kg   SpO2 100%   BMI 24.37 kg/m  Physical Exam Vitals and nursing note reviewed.  Constitutional:      Appearance: Normal appearance.  HENT:     Head: Normocephalic and atraumatic.     Right Ear: External ear normal.     Left Ear: External ear normal.     Nose: Nose normal.     Mouth/Throat:     Pharynx: Oropharynx is clear.  Eyes:     Pupils: Pupils are equal, round, and reactive to light.  Cardiovascular:     Rate and Rhythm: Normal rate and regular rhythm.     Pulses: Normal pulses.  Pulmonary:     Effort: Pulmonary effort is normal.     Breath sounds: Normal breath sounds.  Abdominal:     General: Abdomen is flat.     Palpations: Abdomen is soft.  Musculoskeletal:     Cervical back: Normal range of motion.     Comments: Left shoulder with contusion and deformity Left wrist with deformity Skin tear behind left elbow Patient has ring on left ring finger that was removed here in the emergency department Back examined without any signs of trauma Thoracic, cervical, lumbar spine palpated without point tenderness Bilateral lower extremities examined and no obvious signs of trauma.  Pelvis appears stable.  Patient was able to range both of her lower extremities without significant pain.  Skin:    General: Skin is warm.     Capillary Refill: Capillary refill takes less than 2 seconds.  Neurological:     General: No focal deficit present.     Mental Status: She is alert.  Psychiatric:        Mood and Affect: Mood normal.     ED Results / Procedures / Treatments   Labs (all labs ordered are listed, but only abnormal results are displayed) Labs Reviewed  CBC - Abnormal; Notable for the following components:       Result Value   RBC 3.64 (*)    HCT 34.0 (*)    All other components within normal limits  BASIC METABOLIC PANEL - Abnormal; Notable for the following components:   Potassium 3.3 (*)    CO2 20 (*)    Glucose, Bld 272 (*)    BUN 30 (*)    Creatinine, Ser 1.40 (*)    GFR, Estimated 35 (*)    All other components within normal limits  EKG None  Radiology No results found.  Procedures Procedures    Medications Ordered in ED Medications  ondansetron (ZOFRAN) injection 4 mg (4 mg Intravenous Given 03/23/23 1415)    ED Course/ Medical Decision Making/ A&P                                 Medical Decision Making Amount and/or Complexity of Data Reviewed Labs: ordered. Radiology: ordered.  Risk Prescription drug management.   89 year old female on blood thinners with trip and fall today.  She has pain and injury to her left shoulder and her left wrist.  Patient was seen and evaluated as a level 2 trauma. Airway clear Breathing intact Circulation intact On secondary evaluation patient has obvious deformity and swelling to her left shoulder and deformity of the left wrist with skin tear to left elbow. Patient did not have any obvious trauma to head or neck. Back examined and no signs of trauma to cervical, thoracic, or lumbar spine Pelvis appears stable Bilateral lower extremities with full active range of motion X-rays ordered of the left shoulder, left elbow, left wrist, CT of head and cervical spine have been ordered.  There is a fracture of the left shoulder with shoulder located. X-Tesa Meadors of left wrist, left elbow, CT head and cervical spine are pending at this time Patient signed out to Dr. Karene Fry, who is assumed care at this time        Final Clinical Impression(s) / ED Diagnoses Final diagnoses:  Fall, initial encounter  Closed fracture of left shoulder, initial encounter    Rx / DC Orders ED Discharge Orders     None         Margarita Grizzle,  MD 03/23/23 1546

## 2023-03-23 NOTE — Progress Notes (Signed)
 Orthopedic Tech Progress Note Patient Details:  Allison Duffy 14-Nov-1926 [id number]  Went to apply splint, RN let me know MD wanted a few more CT SCANS of patient wrist and shoulder before applying splint.   Patient ID: Allison Duffy, female   DOB: May 05, 1926, 89 y.o.   MRN: [id number]  [name] 03/23/2023, 6:47 PM

## 2023-03-23 NOTE — ED Triage Notes (Signed)
 Pt fell on left shoulder and left hip. Pt reports stepping in a hole and landing on her shoulder. Pt denies hitting head pt denies any neck pain.  Pt got 200 fentanyl   Ems vitals  164/90 Hr 70 Spo2 98% ra

## 2023-03-23 NOTE — Patient Outreach (Signed)
 Care Coordination   03/23/2023 Name: Allison Duffy MRN: [id number] DOB: Mar 12, 1926   Care Coordination Outreach Attempts:  An unsuccessful outreach was attempted for an appointment today.  Follow Up Plan:  No further outreach attempts will be made at this time. We have been unable to contact the patient to offer or enroll patient in complex care management services.  Encounter Outcome:  No Answer   Care Coordination Interventions:  No, not indicated    Delsa Sale RN BSN CCM   [facility] Nurse Care Coordinator  Direct Dial: [phone] Website: [email or web address]

## 2023-03-23 NOTE — Progress Notes (Signed)
 Orthopedic Tech Progress Note Patient Details:  Allison Duffy 05-11-1926 [id number]  Ortho Devices Type of Ortho Device: Sling immobilizer, Sugartong splint Ortho Device/Splint Location: lue Ortho Device/Splint Interventions: Ordered, Application, Adjustment  I applied the splint with minimal pain to the patient. Post Interventions Patient Tolerated: Well Instructions Provided: Care of device, Adjustment of device  Trinna Post 03/23/2023, 9:08 PM

## 2023-03-23 NOTE — Progress Notes (Signed)
   03/23/23 1403  Spiritual Encounters  Type of Visit Initial  Care provided to: Pt and family  Referral source Trauma page  Reason for visit Routine spiritual support  OnCall Visit Yes  Advance Directives (For Healthcare)  Does Patient Have a Medical Advance Directive? Yes  Mental Health Advance Directives  Does Patient Have a Mental Health Advance Directive? No   Chaplain was paged to trauma level 2. Pt said that she stepped in a hole and landed on her shoulder. She mentioned that she had severe pain on her left arm. Chaplain had a conversation with pt's daughter. She stated that pt's preacher is on the way to visit her. Chaplain was present and provided support. Chaplain will be available.  M.Kubra Delano Metz Resident [phone]

## 2023-03-23 NOTE — ED Notes (Signed)
 Extra blood drawn and sent to main lab.

## 2023-03-23 NOTE — ED Provider Notes (Signed)
  Physical Exam  BP (!) 190/81   Pulse 88   Temp 97.7 F (36.5 C) (Oral)   Resp 19   Ht 5\' 6"  (1.676 m)   Wt 68.5 kg   SpO2 100%   BMI 24.37 kg/m   Physical Exam  Procedures  Procedures  ED Course / MDM    Medical Decision Making Amount and/or Complexity of Data Reviewed Labs: ordered. Radiology: ordered.  Risk Prescription drug management. Decision regarding hospitalization.   75F, fell at home, has likely wrist deformity, fell on shoulder, has skin tear to left elbow. Is on [facility] but didn't hit head or lose consciousness. Films and scans pending.   XR Wrist:  IMPRESSION:  1. Acute, comminuted and impacted intra-articular fracture of the  distal radius.  2. Acute mildly displaced fracture of the ulnar styloid.  3. No definite acute fracture or dislocation of the left elbow.  Limited positioning due to the patient's shoulder injury.   XR Humerus: IMPRESSION:  1. Acute comminuted, impacted and displaced fracture of the left  proximal humerus head and neck junction.  2. Advanced degenerative changes of the left glenohumeral joint.     Orthopedics consulted, spoke with Dr. Everardo Pacific.  He recommended CT imaging of the left shoulder, left wrist for further outpatient management, recommended left shoulder sling and left soft wrist splint placement.  Can follow-up outpatient in clinic.  The patient had continued ongoing pain requirements with multiple doses of IV fentanyl.  She had intractable pain in the emergency department.  CT imaging of the head and cervical spine: IMPRESSION:  1. No acute intracranial abnormality.  2. No compression fracture or displaced fracture in the cervical  spine. No traumatic malalignment.  3. 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  4. Mild chronic microvascular ischemic changes and parenchymal  volume loss.    CT Left Wrist:  IMPRESSION:  Comminuted and impacted fractures of the distal left radius with  fracture lines  extending to the radiocarpal and radioulnar joints.  Minimally displaced fracture of the ulnar styloid process.  Degenerative changes in the carpus.   CT Left Elbow:  IMPRESSION:  No fracture or dislocation is seen.    Mild dorsal soft tissue swelling.   CT Left Shoulder:  IMPRESSION:  Comminuted and impacted fractures of the humeral neck with fracture  lines extending to the humeral head. Varus angulation. Displaced  greater and lesser tuberosity fragments. Underlying prominent  degenerative changes. Hemarthrosis and joint effusion with  intramuscular hematoma and diffuse soft tissue swelling.   Patient with multiple doses of fentanyl without significant improvement in her pain.  Patient and family requesting admission for pain control.  Hospitalist medicine consulted for admission, Dr. Gasper Sells accepting.      Ernie Avena, MD 03/23/23 2352

## 2023-03-24 ENCOUNTER — Encounter (HOSPITAL_COMMUNITY): Payer: Self-pay | Admitting: Internal Medicine

## 2023-03-24 DIAGNOSIS — R52 Pain, unspecified: Secondary | ICD-10-CM | POA: Diagnosis not present

## 2023-03-24 LAB — BASIC METABOLIC PANEL
Anion gap: 6 (ref 5–15)
BUN: 29 mg/dL — ABNORMAL HIGH (ref 8–23)
CO2: 22 mmol/L (ref 22–32)
Calcium: 8.2 mg/dL — ABNORMAL LOW (ref 8.9–10.3)
Chloride: 103 mmol/L (ref 98–111)
Creatinine, Ser: 1.48 mg/dL — ABNORMAL HIGH (ref 0.44–1.00)
GFR, Estimated: 33 mL/min — ABNORMAL LOW (ref >60)
Glucose, Bld: 369 mg/dL — ABNORMAL HIGH (ref 70–99)
Potassium: 3.7 mmol/L (ref 3.5–5.1)
Sodium: 131 mmol/L — ABNORMAL LOW (ref 135–145)

## 2023-03-24 LAB — CBC
HCT: 27.9 % — ABNORMAL LOW (ref 36.0–46.0)
Hemoglobin: 9.9 g/dL — ABNORMAL LOW (ref 12.0–15.0)
MCH: 33.2 pg (ref 26.0–34.0)
MCHC: 35.5 g/dL (ref 30.0–36.0)
MCV: 93.6 fL (ref 80.0–100.0)
Platelets: 178 10*3/uL (ref 150–400)
RBC: 2.98 MIL/uL — ABNORMAL LOW (ref 3.87–5.11)
RDW: 12.3 % (ref 11.5–15.5)
WBC: 11.1 10*3/uL — ABNORMAL HIGH (ref 4.0–10.5)
nRBC: 0 % (ref 0.0–0.2)

## 2023-03-24 LAB — GLUCOSE, CAPILLARY
Glucose-Capillary: 378 mg/dL — ABNORMAL HIGH (ref 70–99)
Glucose-Capillary: 409 mg/dL — ABNORMAL HIGH (ref 70–99)
Glucose-Capillary: 379 mg/dL — ABNORMAL HIGH (ref 70–99)
Glucose-Capillary: 244 mg/dL — ABNORMAL HIGH (ref 70–99)
Glucose-Capillary: 211 mg/dL — ABNORMAL HIGH (ref 70–99)
Glucose-Capillary: 293 mg/dL — ABNORMAL HIGH (ref 70–99)

## 2023-03-24 LAB — IRON AND TIBC
Iron: 18 ug/dL — ABNORMAL LOW (ref 28–170)
Saturation Ratios: 8 % — ABNORMAL LOW (ref 10.4–31.8)
TIBC: 234 ug/dL — ABNORMAL LOW (ref 250–450)
UIBC: 216 ug/dL

## 2023-03-24 LAB — FOLATE: Folate: 35.8 ng/mL (ref >5.9)

## 2023-03-24 LAB — VITAMIN B12: Vitamin B-12: 537 pg/mL (ref 180–914)

## 2023-03-24 LAB — PHOSPHORUS: Phosphorus: 3.7 mg/dL (ref 2.5–4.6)

## 2023-03-24 LAB — MAGNESIUM
Magnesium: 1.8 mg/dL (ref 1.7–2.4)
Magnesium: 1.7 mg/dL (ref 1.7–2.4)

## 2023-03-24 LAB — CK: Total CK: 114 U/L (ref 38–234)

## 2023-03-24 LAB — TSH: TSH: 1.221 u[IU]/mL (ref 0.350–4.500)

## 2023-03-24 LAB — VITAMIN D 25 HYDROXY (VIT D DEFICIENCY, FRACTURES): Vit D, 25-Hydroxy: 51.02 ng/mL (ref 30–100)

## 2023-03-24 MED ORDER — KETOROLAC TROMETHAMINE 15 MG/ML IJ SOLN
15.0000 mg | Freq: Once | INTRAMUSCULAR | Status: AC
  Administered 2023-03-24: 15 mg via INTRAVENOUS
  Filled 2023-03-24: qty 1

## 2023-03-24 MED ORDER — HYDROMORPHONE HCL 1 MG/ML IJ SOLN
0.5000 mg | INTRAMUSCULAR | Status: DC | PRN
  Administered 2023-03-24 – 2023-03-27 (×9): 0.5 mg via INTRAVENOUS
  Filled 2023-03-24 (×9): qty 0.5

## 2023-03-24 MED ORDER — ACETAMINOPHEN 650 MG RE SUPP: 650.0000 mg | Freq: Four times a day (QID) | RECTAL | Status: DC

## 2023-03-24 MED ORDER — INSULIN GLARGINE 100 UNIT/ML ~~LOC~~ SOLN
5.0000 [IU] | Freq: Every day | SUBCUTANEOUS | Status: DC
  Administered 2023-03-24 – 2023-03-25 (×2): 5 [IU] via SUBCUTANEOUS
  Filled 2023-03-24 (×2): qty 0.05

## 2023-03-24 MED ORDER — KETOROLAC TROMETHAMINE 15 MG/ML IJ SOLN: 15.0000 mg | Freq: Four times a day (QID) | INTRAMUSCULAR | Status: DC

## 2023-03-24 MED ORDER — ACETAMINOPHEN 325 MG PO TABS
650.0000 mg | ORAL_TABLET | Freq: Four times a day (QID) | ORAL | Status: DC
  Administered 2023-03-24 – 2023-03-30 (×23): 650 mg via ORAL
  Filled 2023-03-24 (×23): qty 2

## 2023-03-24 NOTE — Consult Note (Signed)
 Reason for Consult:Left humerus/wrist fxs Referring Physician: Gillis Santa Time called: 0730 Time at bedside: 0851   Allison Duffy is an 89 y.o. female.  HPI: Allison Duffy fell at home in her yard after Allison Duffy stepped in a hole. Allison Duffy had immediate left arm pain. Allison Duffy was brought to the ED where x-rays showed a proximal humerus and distal radius fxs and orthopedic surgery was consulted. The plan was for her to f/u as OP but had too much pain to discharge and was admitted. Allison Duffy lives at home alone and generally uses a cane (right hand) for ambulation. Allison Duffy is RHD.  Past Medical History:  Diagnosis Date   Acute on chronic combined systolic and diastolic CHF (congestive heart failure) (HCC) 01/28/2019   Arthritis    "scattered joints" (07/29/2013)   Chest pain    Colitis    Gout attack ?1980's   Hyperlipidemia    Hypertension    PONV (postoperative nausea and vomiting)    Type II diabetes mellitus (HCC)     Past Surgical History:  Procedure Laterality Date   APPENDECTOMY     CARPAL TUNNEL RELEASE Right    CATARACT EXTRACTION W/ INTRAOCULAR LENS  IMPLANT, BILATERAL Bilateral    CHOLECYSTECTOMY     KNEE ARTHROSCOPY Right    LEFT HEART CATH AND CORONARY ANGIOGRAPHY N/A 04/12/2020   Procedure: LEFT HEART CATH AND CORONARY ANGIOGRAPHY;  Surgeon: Runell Gess, MD;  Location: [facility];  Service: Cardiovascular;  Laterality: N/A;   OLECRANON BURSECTOMY Left 07/29/2013   "in ER"   PARATHYROIDECTOMY     TONSILLECTOMY     TOTAL SHOULDER REPLACEMENT  2017   VAGINAL HYSTERECTOMY      Family History  Problem Relation Age of Onset   Hypertension Father    Heart disease Father    Heart disease Mother    Diabetes Mother    Hypertension Mother    Heart disease Brother        both brothers   Diabetes Brother    Hypertension Brother     Social History:  reports that Allison Duffy quit smoking about 69 years ago. Her smoking use included cigarettes. Allison Duffy started smoking about 74 years ago. Allison Duffy has  never used smokeless tobacco. Allison Duffy reports that Allison Duffy does not drink alcohol and does not use drugs.  Allergies:  Allergies  Allergen Reactions   Codeine Nausea And Vomiting   Hydrocodone Nausea And Vomiting    Nausea and vomiting   Prednisone     Other reaction(s): sick on her stomach   Sulfa Antibiotics Itching, Nausea Only and Other (See Comments)    Medications: I have reviewed the patient's current medications.  Results for orders placed or performed during the hospital encounter of 03/23/23 (from the past 48 hours)  CBC     Status: Abnormal   Collection Time: 03/23/23  2:17 PM  Result Value Ref Range   WBC 10.1 4.0 - 10.5 K/uL   RBC 3.64 (L) 3.87 - 5.11 MIL/uL   Hemoglobin 12.2 12.0 - 15.0 g/dL   HCT 19.1 (L) 47.8 - 29.5 %   MCV 93.4 80.0 - 100.0 fL   MCH 33.5 26.0 - 34.0 pg   MCHC 35.9 30.0 - 36.0 g/dL   RDW 62.1 30.8 - 65.7 %   Platelets 222 150 - 400 K/uL   nRBC 0.0 0.0 - 0.2 %    Comment: Performed at [facility], [address], [place], [place] [address]  Basic metabolic panel  Status: Abnormal   Collection Time: 03/23/23  2:17 PM  Result Value Ref Range   Sodium 136 135 - 145 mmol/L   Potassium 3.3 (L) 3.5 - 5.1 mmol/L   Chloride 103 98 - 111 mmol/L   CO2 20 (L) 22 - 32 mmol/L   Glucose, Bld 272 (H) 70 - 99 mg/dL    Comment: Glucose reference range applies only to samples taken after fasting for at least 8 hours.   BUN 30 (H) 8 - 23 mg/dL   Creatinine, Ser 6.04 (H) 0.44 - 1.00 mg/dL   Calcium 9.4 8.9 - 54.0 mg/dL   GFR, Estimated 35 (L) >60 mL/min    Comment: (NOTE) Calculated using the CKD-EPI Creatinine Equation (2021)    Anion gap 13 5 - 15    Comment: Performed at [facility], [address] [place], [place] [address]  TSH     Status: None   Collection Time: 03/24/23  6:07 AM  Result Value Ref Range   TSH 1.221 0.350 - 4.500 uIU/mL    Comment: Performed by a 3rd Generation assay with a functional sensitivity of <=0.01  uIU/mL. Performed at [facility], [address] [place], [place] [address]   Magnesium     Status: None   Collection Time: 03/24/23  6:07 AM  Result Value Ref Range   Magnesium 1.8 1.7 - 2.4 mg/dL    Comment: Performed at [facility], [address], [place], [place] [address]    CT Shoulder Left Wo Contrast Result Date: 03/23/2023 CLINICAL DATA:  Shoulder trauma with fracture seen on prior radiographs. EXAM: CT OF THE UPPER LEFT EXTREMITY WITHOUT CONTRAST TECHNIQUE: Multidetector CT imaging of the upper left extremity was performed according to the standard protocol. RADIATION DOSE REDUCTION: This exam was performed according to the departmental dose-optimization program which includes automated exposure control, adjustment of the mA and/or kV according to patient size and/or use of iterative reconstruction technique. COMPARISON:  Left shoulder radiographs 03/23/2023 FINDINGS: Bones/Joint/Cartilage Mostly transverse comminuted and impacted fracture of the humeral neck with fracture lines extending to the humeral head. Varus angulation of fracture fragments. Displaced greater and lesser tuberosity fragments. Fracture lines do not appear to extend to the glenohumeral joint surface. Underlying degenerative changes are demonstrated in the glenohumeral and acromioclavicular joints with prominent osteophyte formation on both sides of the joints. Moderate-sized effusion with fat fluid level consistent with hemarthrosis. Calcification in the subcoracoid bursa likely represents a pre-existing loose body although could indicate a displaced fracture fragment. Cartilaginous calcification. Ligaments Suboptimally assessed by CT. Muscles and Tendons Intramuscular hematomas in the shoulder musculature. Soft tissues Prominent soft tissue swelling about the left shoulder. IMPRESSION: Comminuted and impacted fractures of the humeral neck with fracture lines extending to the humeral head. Varus angulation.  Displaced greater and lesser tuberosity fragments. Underlying prominent degenerative changes. Hemarthrosis and joint effusion with intramuscular hematoma and diffuse soft tissue swelling. Electronically Signed   By: Burman Nieves M.D.   On: 03/23/2023 21:06   CT ELBOW LEFT WO CONTRAST Result Date: 03/23/2023 CLINICAL DATA:  Trauma/fall EXAM: CT OF THE UPPER LEFT EXTREMITY WITHOUT CONTRAST TECHNIQUE: Multidetector CT imaging of the upper left extremity was performed according to the standard protocol. RADIATION DOSE REDUCTION: This exam was performed according to the departmental dose-optimization program which includes automated exposure control, adjustment of the mA and/or kV according to patient size and/or use of iterative reconstruction technique. COMPARISON:  Left elbow radiographs dated 03/23/2023 FINDINGS: No  fracture or dislocation is seen. Elbow joint space is preserved. No elbow joint effusion. Mild degenerative spurring along the olecranon. Mild soft tissue swelling along the dorsal aspect of the proximal ulna (series 11/image 22). IMPRESSION: No fracture or dislocation is seen. Mild dorsal soft tissue swelling. Electronically Signed   By: Charline Bills M.D.   On: 03/23/2023 21:04   CT Wrist Left Wo Contrast Result Date: 03/23/2023 CLINICAL DATA:  Fracture of the left wrist seen on prior radiographs. EXAM: CT OF THE LEFT WRIST WITHOUT CONTRAST TECHNIQUE: Multidetector CT imaging was performed according to the standard protocol. Multiplanar CT image reconstructions were also generated. RADIATION DOSE REDUCTION: This exam was performed according to the departmental dose-optimization program which includes automated exposure control, adjustment of the mA and/or kV according to patient size and/or use of iterative reconstruction technique. COMPARISON:  Left wrist radiographs 03/23/2023 FINDINGS: Bones/Joint/Cartilage Comminuted fractures of the distal left radius with fracture lines extending to  the radiocarpal and radioulnar joints. There is impaction of the fractures with mild displacement of fracture fragments, particularly involving the radial styloid process. Minimally displaced fractures of the ulnar styloid process. No definite carpal bone fractures. Degenerative changes are demonstrated in the carpus. Calcification of the triangular fibrocartilage and of the scapholunate ligament. Ligaments Suboptimally assessed by CT. Muscles and Tendons No intramuscular mass or hematoma demonstrated. Soft tissues Mild soft tissue swelling. IMPRESSION: Comminuted and impacted fractures of the distal left radius with fracture lines extending to the radiocarpal and radioulnar joints. Minimally displaced fracture of the ulnar styloid process. Degenerative changes in the carpus. Electronically Signed   By: Burman Nieves M.D.   On: 03/23/2023 21:00   CT Head Wo Contrast Result Date: 03/23/2023 CLINICAL DATA:  Trauma, fall EXAM: CT HEAD WITHOUT CONTRAST CT CERVICAL SPINE WITHOUT CONTRAST TECHNIQUE: Multidetector CT imaging of the head and cervical spine was performed following the standard protocol without intravenous contrast. Multiplanar CT image reconstructions of the cervical spine were also generated. RADIATION DOSE REDUCTION: This exam was performed according to the departmental dose-optimization program which includes automated exposure control, adjustment of the mA and/or kV according to patient size and/or use of iterative reconstruction technique. COMPARISON:  CT head and CT neck 11/07/2021. FINDINGS: CT HEAD FINDINGS Brain: No acute intracranial hemorrhage. No CT evidence of acute infarct. 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 Nonspecific hypoattenuation in the periventricular and subcortical white matter favored to reflect chronic microvascular ischemic changes. No edema, mass effect, or midline shift. The basilar cisterns are patent. Ventricles: Prominence of the  ventricles suggesting underlying parenchymal volume loss. Vascular: Atherosclerotic calcification of the bilateral carotid siphons and intracranial vertebral arteries. No hyperdense vessel. Skull: No acute or aggressive finding. Orbits: Orbits are symmetric. Sinuses: The visualized paranasal sinuses are clear. Other: Mastoid air cells are clear. CT CERVICAL SPINE FINDINGS Alignment: Cervical lordosis is maintained. No listhesis. No facet subluxation or dislocation. Skull base and vertebrae: No compression fracture or displaced fracture in the cervical spine. No focal pathologic lesion. Soft tissues and spinal canal: No prevertebral fluid or swelling. No visible canal hematoma. Disc levels: Intervertebral disc space narrowing at multiple levels greatest at C6-7. Disc osteophyte complexes at multiple levels in the cervical spine. No high-grade osseous spinal canal stenosis. Facet arthrosis at multiple levels. Foraminal narrowing most pronounced bilaterally at C5-6 and C6-7. Upper chest: Negative. Other: None. IMPRESSION: 1. No acute intracranial abnormality. 2. No compression fracture or displaced fracture in the cervical spine. No traumatic malalignment. 3. Redemonstrated remote infarct in  the left cerebellum. 4. Mild chronic microvascular ischemic changes and parenchymal volume loss. Electronically Signed   By: Emily Filbert M.D.   On: 03/23/2023 18:47   CT Cervical Spine Wo Contrast Result Date: 03/23/2023 CLINICAL DATA:  Trauma, fall EXAM: CT HEAD WITHOUT CONTRAST CT CERVICAL SPINE WITHOUT CONTRAST TECHNIQUE: Multidetector CT imaging of the head and cervical spine was performed following the standard protocol without intravenous contrast. Multiplanar CT image reconstructions of the cervical spine were also generated. RADIATION DOSE REDUCTION: This exam was performed according to the departmental dose-optimization program which includes automated exposure control, adjustment of the mA and/or kV according to  patient size and/or use of iterative reconstruction technique. COMPARISON:  CT head and CT neck 11/07/2021. FINDINGS: CT HEAD FINDINGS Brain: No acute intracranial hemorrhage. No CT evidence of acute infarct. 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 Nonspecific hypoattenuation in the periventricular and subcortical white matter favored to reflect chronic microvascular ischemic changes. No edema, mass effect, or midline shift. The basilar cisterns are patent. Ventricles: Prominence of the ventricles suggesting underlying parenchymal volume loss. Vascular: Atherosclerotic calcification of the bilateral carotid siphons and intracranial vertebral arteries. No hyperdense vessel. Skull: No acute or aggressive finding. Orbits: Orbits are symmetric. Sinuses: The visualized paranasal sinuses are clear. Other: Mastoid air cells are clear. CT CERVICAL SPINE FINDINGS Alignment: Cervical lordosis is maintained. No listhesis. No facet subluxation or dislocation. Skull base and vertebrae: No compression fracture or displaced fracture in the cervical spine. No focal pathologic lesion. Soft tissues and spinal canal: No prevertebral fluid or swelling. No visible canal hematoma. Disc levels: Intervertebral disc space narrowing at multiple levels greatest at C6-7. Disc osteophyte complexes at multiple levels in the cervical spine. No high-grade osseous spinal canal stenosis. Facet arthrosis at multiple levels. Foraminal narrowing most pronounced bilaterally at C5-6 and C6-7. Upper chest: Negative. Other: None. IMPRESSION: 1. No acute intracranial abnormality. 2. No compression fracture or displaced fracture in the cervical spine. No traumatic malalignment. 3. 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 4. Mild chronic microvascular ischemic changes and parenchymal volume loss. Electronically Signed   By: Emily Filbert M.D.   On: 03/23/2023 18:47   DG Wrist Complete Left Result Date:  03/23/2023 CLINICAL DATA:  Fall onto left shoulder and left hip. Left elbow and wrist pain. EXAM: LEFT WRIST - COMPLETE 3+ VIEW; LEFT ELBOW - COMPLETE 3+ VIEW COMPARISON:  Shoulder radiographs same date. Elbow radiographs 07/29/2013. FINDINGS: Left elbow: Positioning limited by the patient's shoulder injury. The bones are demineralized. No definite acute fracture, dislocation or significant joint effusion identified. Chronic spurring of the olecranon process. Possible soft tissue swelling dorsally in the proximal forearm. Left wrist: The bones are demineralized. There is an acute, comminuted and impacted intra-articular fracture of the distal radius. There is a mildly displaced acute fracture of the ulnar styloid. The carpal bones appear intact and normally aligned. Moderately advanced degenerative changes at the 1st carpometacarpal joint. Scattered additional mild degenerative changes elsewhere in the wrist and throughout the visualized fingers. Soft tissue swelling about the wrist without evidence of foreign body or soft tissue emphysema. IMPRESSION: 1. Acute, comminuted and impacted intra-articular fracture of the distal radius. 2. Acute mildly displaced fracture of the ulnar styloid. 3. No definite acute fracture or dislocation of the left elbow. Limited positioning due to the patient's shoulder injury. Electronically Signed   By: Carey Bullocks M.D.   On: 03/23/2023 16:59   DG Elbow Complete Left Result Date: 03/23/2023 CLINICAL DATA:  Fall  onto left shoulder and left hip. Left elbow and wrist pain. EXAM: LEFT WRIST - COMPLETE 3+ VIEW; LEFT ELBOW - COMPLETE 3+ VIEW COMPARISON:  Shoulder radiographs same date. Elbow radiographs 07/29/2013. FINDINGS: Left elbow: Positioning limited by the patient's shoulder injury. The bones are demineralized. No definite acute fracture, dislocation or significant joint effusion identified. Chronic spurring of the olecranon process. Possible soft tissue swelling dorsally in  the proximal forearm. Left wrist: The bones are demineralized. There is an acute, comminuted and impacted intra-articular fracture of the distal radius. There is a mildly displaced acute fracture of the ulnar styloid. The carpal bones appear intact and normally aligned. Moderately advanced degenerative changes at the 1st carpometacarpal joint. Scattered additional mild degenerative changes elsewhere in the wrist and throughout the visualized fingers. Soft tissue swelling about the wrist without evidence of foreign body or soft tissue emphysema. IMPRESSION: 1. Acute, comminuted and impacted intra-articular fracture of the distal radius. 2. Acute mildly displaced fracture of the ulnar styloid. 3. No definite acute fracture or dislocation of the left elbow. Limited positioning due to the patient's shoulder injury. Electronically Signed   By: Carey Bullocks M.D.   On: 03/23/2023 16:59   DG Pelvis Portable Result Date: 03/23/2023 CLINICAL DATA:  Fall. EXAM: PORTABLE PELVIS 1-2 VIEWS COMPARISON:  CT abdomen/pelvis dated March 20, 2022. FINDINGS: There is no evidence of pelvic fracture or diastasis. Mild-to-moderate degenerative changes of the bilateral hips. Sacroiliac joints and pubic symphysis appear anatomically aligned with degenerative changes. IMPRESSION: No acute osseous abnormality identified. Electronically Signed   By: Hart Robinsons M.D.   On: 03/23/2023 15:54   DG Chest Port 1 View Result Date: 03/23/2023 CLINICAL DATA:  Fall. EXAM: PORTABLE CHEST 1 VIEW COMPARISON:  Chest radiograph dated 07/23/2022. FINDINGS: Low lung volumes with asymmetric elevation of the right hemidiaphragm. The cardiomediastinal silhouette is within normal limits. Aortic atherosclerosis. No focal consolidation, sizeable pleural effusion, or pneumothorax. Prior right reverse shoulder arthroplasty. Surgical clips overlie the superior mediastinum. No acute osseous abnormality identified. IMPRESSION: Low lung volumes with  elevation of the right hemidiaphragm. Otherwise, no acute findings in the chest. Electronically Signed   By: Hart Robinsons M.D.   On: 03/23/2023 15:52   DG Shoulder Left Result Date: 03/23/2023 CLINICAL DATA:  Left shoulder pain status post fall. EXAM: LEFT SHOULDER - 2+ VIEW COMPARISON:  07/23/2022. FINDINGS: Diffuse osseous demineralization. Acute comminuted impacted fracture of the left proximal humerus head and neck junction at the level of the surgical neck with mild cephalad displacement of the humeral shaft component. The glenohumeral joint appears aligned with advanced degenerative changes including large bulky inferior humeral head osteophytosis. The acromioclavicular joint is anatomically aligned with mild degenerative changes. IMPRESSION: 1. Acute comminuted, impacted and displaced fracture of the left proximal humerus head and neck junction. 2. Advanced degenerative changes of the left glenohumeral joint. Electronically Signed   By: Hart Robinsons M.D.   On: 03/23/2023 15:49    Review of Systems  HENT:  Negative for ear discharge, ear pain, hearing loss and tinnitus.   Eyes:  Negative for photophobia and pain.  Respiratory:  Negative for cough and shortness of breath.   Cardiovascular:  Negative for chest pain.  Gastrointestinal:  Negative for abdominal pain, nausea and vomiting.  Genitourinary:  Negative for dysuria, flank pain, frequency and urgency.  Musculoskeletal:  Positive for arthralgias (Left shoulder and wrist). Negative for back pain, myalgias and neck pain.  Neurological:  Negative for dizziness and headaches.  Hematological:  Does not  bruise/bleed easily.  Psychiatric/Behavioral:  The patient is not nervous/anxious.    Blood pressure (!) 122/59, pulse 89, temperature 98.9 F (37.2 C), temperature source Oral, resp. rate 18, height 5\' 6"  (1.676 m), weight 68.5 kg, SpO2 94%. Physical Exam Constitutional:      General: Allison Duffy is not in acute distress.    Appearance: Allison Duffy  is well-developed. Allison Duffy is not diaphoretic.  HENT:     Head: Normocephalic and atraumatic.  Eyes:     General: No scleral icterus.       Right eye: No discharge.        Left eye: No discharge.     Conjunctiva/sclera: Conjunctivae normal.  Cardiovascular:     Rate and Rhythm: Normal rate and regular rhythm.  Pulmonary:     Effort: Pulmonary effort is normal. No respiratory distress.  Musculoskeletal:     Cervical back: Normal range of motion.     Comments: Left shoulder, elbow, wrist, digits- no skin wounds, severe TTP shoulder, sugar tong splint/sling in place, ecchymotic ant shoulder, no instability, no blocks to motion  Sens  Ax/R/M/U intact  Mot   Ax/ R/ PIN/ M/ AIN/ U grossly intact  Skin:    General: Skin is warm and dry.  Neurological:     Mental Status: Allison Duffy is alert.  Psychiatric:        Mood and Affect: Mood normal.        Behavior: Behavior normal.     Assessment/Plan: Left humerus fx -- Still recommend OP f/u to discuss pros/cons of surgery vs non-operative treatment. Suggest f/u with Dr. Everardo Pacific or her previous orthopedist at [facility] next week. Left wrist fx -- As above Multiple medical problems including paroxysmal atrial fibrillation on Eliquis, type 2 diabetes mellitus, hypertension, and coronary artery disease -- per primary service    Freeman Caldron, PA-C Orthopedic Surgery [phone] 03/24/2023, 9:00 AM

## 2023-03-24 NOTE — Plan of Care (Signed)

## 2023-03-24 NOTE — TOC CAGE-AID Note (Signed)
 Transition of Care [facility]) - CAGE-AID Screening   Patient Details  Name: Allison Duffy MRN: [id number] Date of Birth: Nov 30, 1926  [facility]) CM/SW Contact:    Janora Norlander, RN Phone Number: [phone] 03/24/2023, 8:03 AM   Clinical Narrative: Pt here after sustaining a left shoulder and left wrist fx due to a fall.  Pt reports no alcohol use and no illicit drug use. Screening complete.    CAGE-AID Screening:    Have You Ever Felt You Ought to Cut Down on Your Drinking or Drug Use?: No Have People Annoyed You By Critizing Your Drinking Or Drug Use?: No Have You Felt Bad Or Guilty About Your Drinking Or Drug Use?: No Have You Ever Had a Drink or Used Drugs First Thing In The Morning to Steady Your Nerves or to Get Rid of a Hangover?: No CAGE-AID Score: 0  Substance Abuse Education Offered: No

## 2023-03-24 NOTE — Progress Notes (Signed)
 [facility] Progress Note  Patient: Allison Duffy    ZOX:[id number]  DOA: 03/23/2023     Date of Service: the patient was seen and examined on 03/24/2023  No chief complaint on file.  Brief hospital course: Allison Duffy is a 89 y.o. female with medical history significant for paroxysmal atrial fibrillation on Eliquis, type 2 diabetes mellitus, hypertension, and coronary artery disease who is independent and lives alone.  She was doing yard work and stepped in a hole which caused he to fall.  She was trying not to hit her head but her left side banged on the side of her porch.  The patient came in by EMS.  Her workup in the emergency department included multiple x-rays and she was found to have a left shoulder and left wrist fracture.  Ortho was called and recommended splinting with outpatient follow-up.  The patient received fentanyl in the emergency department and still had 10 out of 10 pain.  She was moaning and got nauseated.  They are asked to admit the patient for pain control.  A splint was placed in the ED.    Assessment and Plan:  # Left humerus  and wrist fracture with intractable pain - Admit for pain control.  Dilaudid is helping better than her Fentanyl dose.  - Monitoring closely given her advanced age and renal insufficiency. - Continue antiemetics as narcotics make her nauseated - Sling in place. Outpatient Ortho follow up. 2/25 Toradol 15 mg one-time dose given, started scheduled Tylenol.  Continue  # History of paroxysmal atrial fibrillation on Eliquis - Holding Eliquis for now.  Resume at discharge.   # CAD, Hypertension and HLD Continue home meds amlodipine 10 mg p.o. daily, clonidine 0.1 mg p.o. twice daily, hydralazine 37.5 mg p.o. 3 times daily, irbesartan 3 mg p.o. daily, Imdur 20 mg p.o. 3 times daily, Ranexa 500 mg p.o. twice daily Crestor 20 mg p.o. daily Check CK level and titrate statin accordingly  # Hypokalemia - Replete.   magnesium level 1.8--1.7  continue to monitor   # Hyponatremia most likely due to hyperglycemia Monitor sodium level daily  # Diabetes mellitus, uncontrolled blood sugar Continue Semglee and NovoLog sliding scale Monitor CBG, continue diabetic diet  # Anemia, possible due to acute blood loss secondary to fracture Hb 12.2--9.9 dropped Held Eliquis for now Follow anemia workup  # Hypocalcemia calcium 8.2 slightly low Check vitamin D level  # CKD stage IIIb Baseline creatinine 1.36 Creatinine is baseline Continue to monitor renal functions   Body mass index is 24.37 kg/m.  Interventions:  Diet: Modified diet DVT Prophylaxis: SCDs  Advance goals of care discussion: Full code  Family Communication: family was present at bedside, at the time of interview.  The pt provided permission to discuss medical plan with the family. Opportunity was given to ask question and all questions were answered satisfactorily.   Disposition:  Pt is from Home, admitted with Fall and Fracture of Left shoulder and wrist, still has intractable pain, which precludes a safe discharge. Discharge to SNF, when pain will be under control and needs PT and OT eval. Follow TOC for placement.  May plan to DC in 1 to 2 days  Subjective: No significant events overnight, patient is still in significant pain, 13/10, feels nausea after opiates.  Denied any other complaints.  Physical Exam: General: Mild to moderate distress secondary to severe pain in the left arm. Appear in mild to Mod distress, affect depressed due to pain  Eyes: PERRLA ENT: Oral Mucosa Clear, moist  Neck: no JVD,  Cardiovascular: S1 and S2 Present, no Murmur,  Respiratory: good respiratory effort, Bilateral Air entry equal and Decreased, no Crackles, no wheezes Abdomen: Bowel Sound present, Soft and no tenderness,  Skin: no rashes Extremities: no Pedal edema, no calf tenderness.  Left arm splint/ sling intact Neurologic: without any new focal findings Gait not  checked due to patient safety concerns  Vitals:   03/23/23 2243 03/23/23 2330 03/24/23 0430 03/24/23 0847  BP:  (!) 186/88 (!) 140/83 (!) 122/59  Pulse:  (!) 103 90 89  Resp:  18 18 18   Temp: 98 F (36.7 C) 97.9 F (36.6 C) 98.4 F (36.9 C) 98.9 F (37.2 C)  TempSrc: Oral Oral Oral Oral  SpO2:  96% 94%   Weight:      Height:       No intake or output data in the 24 hours ending 03/24/23 1432 Filed Weights   03/23/23 1402  Weight: 68.5 kg    Data Reviewed: I have personally reviewed and interpreted daily labs, tele strips, imagings as discussed above. I reviewed all nursing notes, pharmacy notes, vitals, pertinent old records I have discussed plan of care as described above with RN and patient/family.  CBC: Recent Labs  Lab 03/23/23 1417 03/24/23 0835  WBC 10.1 11.1*  HGB 12.2 9.9*  HCT 34.0* 27.9*  MCV 93.4 93.6  PLT 222 178   Basic Metabolic Panel: Recent Labs  Lab 03/23/23 1417 03/24/23 0607 03/24/23 0835  NA 136  --  131*  K 3.3*  --  3.7  CL 103  --  103  CO2 20*  --  22  GLUCOSE 272*  --  369*  BUN 30*  --  29*  CREATININE 1.40*  --  1.48*  CALCIUM 9.4  --  8.2*  MG  --  1.8 1.7  PHOS  --   --  3.7    Studies: CT Shoulder Left Wo Contrast Result Date: 03/23/2023 CLINICAL DATA:  Shoulder trauma with fracture seen on prior radiographs. EXAM: CT OF THE UPPER LEFT EXTREMITY WITHOUT CONTRAST TECHNIQUE: Multidetector CT imaging of the upper left extremity was performed according to the standard protocol. RADIATION DOSE REDUCTION: This exam was performed according to the departmental dose-optimization program which includes automated exposure control, adjustment of the mA and/or kV according to patient size and/or use of iterative reconstruction technique. COMPARISON:  Left shoulder radiographs 03/23/2023 FINDINGS: Bones/Joint/Cartilage Mostly transverse comminuted and impacted fracture of the humeral neck with fracture lines extending to the humeral head.  Varus angulation of fracture fragments. Displaced greater and lesser tuberosity fragments. Fracture lines do not appear to extend to the glenohumeral joint surface. Underlying degenerative changes are demonstrated in the glenohumeral and acromioclavicular joints with prominent osteophyte formation on both sides of the joints. Moderate-sized effusion with fat fluid level consistent with hemarthrosis. Calcification in the subcoracoid bursa likely represents a pre-existing loose body although could indicate a displaced fracture fragment. Cartilaginous calcification. Ligaments Suboptimally assessed by CT. Muscles and Tendons Intramuscular hematomas in the shoulder musculature. Soft tissues Prominent soft tissue swelling about the left shoulder. IMPRESSION: Comminuted and impacted fractures of the humeral neck with fracture lines extending to the humeral head. Varus angulation. Displaced greater and lesser tuberosity fragments. Underlying prominent degenerative changes. Hemarthrosis and joint effusion with intramuscular hematoma and diffuse soft tissue swelling. Electronically Signed   By: Burman Nieves M.D.   On: 03/23/2023 21:06   CT ELBOW LEFT  WO CONTRAST Result Date: 03/23/2023 CLINICAL DATA:  Trauma/fall EXAM: CT OF THE UPPER LEFT EXTREMITY WITHOUT CONTRAST TECHNIQUE: Multidetector CT imaging of the upper left extremity was performed according to the standard protocol. RADIATION DOSE REDUCTION: This exam was performed according to the departmental dose-optimization program which includes automated exposure control, adjustment of the mA and/or kV according to patient size and/or use of iterative reconstruction technique. COMPARISON:  Left elbow radiographs dated 03/23/2023 FINDINGS: No fracture or dislocation is seen. Elbow joint space is preserved. No elbow joint effusion. Mild degenerative spurring along the olecranon. Mild soft tissue swelling along the dorsal aspect of the proximal ulna (series 11/image  22). IMPRESSION: No fracture or dislocation is seen. Mild dorsal soft tissue swelling. Electronically Signed   By: Charline Bills M.D.   On: 03/23/2023 21:04   CT Wrist Left Wo Contrast Result Date: 03/23/2023 CLINICAL DATA:  Fracture of the left wrist seen on prior radiographs. EXAM: CT OF THE LEFT WRIST WITHOUT CONTRAST TECHNIQUE: Multidetector CT imaging was performed according to the standard protocol. Multiplanar CT image reconstructions were also generated. RADIATION DOSE REDUCTION: This exam was performed according to the departmental dose-optimization program which includes automated exposure control, adjustment of the mA and/or kV according to patient size and/or use of iterative reconstruction technique. COMPARISON:  Left wrist radiographs 03/23/2023 FINDINGS: Bones/Joint/Cartilage Comminuted fractures of the distal left radius with fracture lines extending to the radiocarpal and radioulnar joints. There is impaction of the fractures with mild displacement of fracture fragments, particularly involving the radial styloid process. Minimally displaced fractures of the ulnar styloid process. No definite carpal bone fractures. Degenerative changes are demonstrated in the carpus. Calcification of the triangular fibrocartilage and of the scapholunate ligament. Ligaments Suboptimally assessed by CT. Muscles and Tendons No intramuscular mass or hematoma demonstrated. Soft tissues Mild soft tissue swelling. IMPRESSION: Comminuted and impacted fractures of the distal left radius with fracture lines extending to the radiocarpal and radioulnar joints. Minimally displaced fracture of the ulnar styloid process. Degenerative changes in the carpus. Electronically Signed   By: Burman Nieves M.D.   On: 03/23/2023 21:00   CT Head Wo Contrast Result Date: 03/23/2023 CLINICAL DATA:  Trauma, fall EXAM: CT HEAD WITHOUT CONTRAST CT CERVICAL SPINE WITHOUT CONTRAST TECHNIQUE: Multidetector CT imaging of the head and  cervical spine was performed following the standard protocol without intravenous contrast. Multiplanar CT image reconstructions of the cervical spine were also generated. RADIATION DOSE REDUCTION: This exam was performed according to the departmental dose-optimization program which includes automated exposure control, adjustment of the mA and/or kV according to patient size and/or use of iterative reconstruction technique. COMPARISON:  CT head and CT neck 11/07/2021. FINDINGS: CT HEAD FINDINGS Brain: No acute intracranial hemorrhage. No CT evidence of acute infarct. 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 Nonspecific hypoattenuation in the periventricular and subcortical white matter favored to reflect chronic microvascular ischemic changes. No edema, mass effect, or midline shift. The basilar cisterns are patent. Ventricles: Prominence of the ventricles suggesting underlying parenchymal volume loss. Vascular: Atherosclerotic calcification of the bilateral carotid siphons and intracranial vertebral arteries. No hyperdense vessel. Skull: No acute or aggressive finding. Orbits: Orbits are symmetric. Sinuses: The visualized paranasal sinuses are clear. Other: Mastoid air cells are clear. CT CERVICAL SPINE FINDINGS Alignment: Cervical lordosis is maintained. No listhesis. No facet subluxation or dislocation. Skull base and vertebrae: No compression fracture or displaced fracture in the cervical spine. No focal pathologic lesion. Soft tissues and spinal canal: No prevertebral fluid or  swelling. No visible canal hematoma. Disc levels: Intervertebral disc space narrowing at multiple levels greatest at C6-7. Disc osteophyte complexes at multiple levels in the cervical spine. No high-grade osseous spinal canal stenosis. Facet arthrosis at multiple levels. Foraminal narrowing most pronounced bilaterally at C5-6 and C6-7. Upper chest: Negative. Other: None. IMPRESSION: 1. No acute intracranial  abnormality. 2. No compression fracture or displaced fracture in the cervical spine. No traumatic malalignment. 3. 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 4. Mild chronic microvascular ischemic changes and parenchymal volume loss. Electronically Signed   By: Emily Filbert M.D.   On: 03/23/2023 18:47   CT Cervical Spine Wo Contrast Result Date: 03/23/2023 CLINICAL DATA:  Trauma, fall EXAM: CT HEAD WITHOUT CONTRAST CT CERVICAL SPINE WITHOUT CONTRAST TECHNIQUE: Multidetector CT imaging of the head and cervical spine was performed following the standard protocol without intravenous contrast. Multiplanar CT image reconstructions of the cervical spine were also generated. RADIATION DOSE REDUCTION: This exam was performed according to the departmental dose-optimization program which includes automated exposure control, adjustment of the mA and/or kV according to patient size and/or use of iterative reconstruction technique. COMPARISON:  CT head and CT neck 11/07/2021. FINDINGS: CT HEAD FINDINGS Brain: No acute intracranial hemorrhage. No CT evidence of acute infarct. 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 Nonspecific hypoattenuation in the periventricular and subcortical white matter favored to reflect chronic microvascular ischemic changes. No edema, mass effect, or midline shift. The basilar cisterns are patent. Ventricles: Prominence of the ventricles suggesting underlying parenchymal volume loss. Vascular: Atherosclerotic calcification of the bilateral carotid siphons and intracranial vertebral arteries. No hyperdense vessel. Skull: No acute or aggressive finding. Orbits: Orbits are symmetric. Sinuses: The visualized paranasal sinuses are clear. Other: Mastoid air cells are clear. CT CERVICAL SPINE FINDINGS Alignment: Cervical lordosis is maintained. No listhesis. No facet subluxation or dislocation. Skull base and vertebrae: No compression fracture or displaced fracture  in the cervical spine. No focal pathologic lesion. Soft tissues and spinal canal: No prevertebral fluid or swelling. No visible canal hematoma. Disc levels: Intervertebral disc space narrowing at multiple levels greatest at C6-7. Disc osteophyte complexes at multiple levels in the cervical spine. No high-grade osseous spinal canal stenosis. Facet arthrosis at multiple levels. Foraminal narrowing most pronounced bilaterally at C5-6 and C6-7. Upper chest: Negative. Other: None. IMPRESSION: 1. No acute intracranial abnormality. 2. No compression fracture or displaced fracture in the cervical spine. No traumatic malalignment. 3. 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 4. Mild chronic microvascular ischemic changes and parenchymal volume loss. Electronically Signed   By: Emily Filbert M.D.   On: 03/23/2023 18:47   DG Wrist Complete Left Result Date: 03/23/2023 CLINICAL DATA:  Fall onto left shoulder and left hip. Left elbow and wrist pain. EXAM: LEFT WRIST - COMPLETE 3+ VIEW; LEFT ELBOW - COMPLETE 3+ VIEW COMPARISON:  Shoulder radiographs same date. Elbow radiographs 07/29/2013. FINDINGS: Left elbow: Positioning limited by the patient's shoulder injury. The bones are demineralized. No definite acute fracture, dislocation or significant joint effusion identified. Chronic spurring of the olecranon process. Possible soft tissue swelling dorsally in the proximal forearm. Left wrist: The bones are demineralized. There is an acute, comminuted and impacted intra-articular fracture of the distal radius. There is a mildly displaced acute fracture of the ulnar styloid. The carpal bones appear intact and normally aligned. Moderately advanced degenerative changes at the 1st carpometacarpal joint. Scattered additional mild degenerative changes elsewhere in the wrist and throughout the visualized fingers. Soft tissue swelling about the  wrist without evidence of foreign body or soft tissue emphysema. IMPRESSION: 1.  Acute, comminuted and impacted intra-articular fracture of the distal radius. 2. Acute mildly displaced fracture of the ulnar styloid. 3. No definite acute fracture or dislocation of the left elbow. Limited positioning due to the patient's shoulder injury. Electronically Signed   By: Carey Bullocks M.D.   On: 03/23/2023 16:59   DG Elbow Complete Left Result Date: 03/23/2023 CLINICAL DATA:  Fall onto left shoulder and left hip. Left elbow and wrist pain. EXAM: LEFT WRIST - COMPLETE 3+ VIEW; LEFT ELBOW - COMPLETE 3+ VIEW COMPARISON:  Shoulder radiographs same date. Elbow radiographs 07/29/2013. FINDINGS: Left elbow: Positioning limited by the patient's shoulder injury. The bones are demineralized. No definite acute fracture, dislocation or significant joint effusion identified. Chronic spurring of the olecranon process. Possible soft tissue swelling dorsally in the proximal forearm. Left wrist: The bones are demineralized. There is an acute, comminuted and impacted intra-articular fracture of the distal radius. There is a mildly displaced acute fracture of the ulnar styloid. The carpal bones appear intact and normally aligned. Moderately advanced degenerative changes at the 1st carpometacarpal joint. Scattered additional mild degenerative changes elsewhere in the wrist and throughout the visualized fingers. Soft tissue swelling about the wrist without evidence of foreign body or soft tissue emphysema. IMPRESSION: 1. Acute, comminuted and impacted intra-articular fracture of the distal radius. 2. Acute mildly displaced fracture of the ulnar styloid. 3. No definite acute fracture or dislocation of the left elbow. Limited positioning due to the patient's shoulder injury. Electronically Signed   By: Carey Bullocks M.D.   On: 03/23/2023 16:59    Scheduled Meds:  acetaminophen  650 mg Oral Q6H   Or   acetaminophen  650 mg Rectal Q6H   acidophilus  1 capsule Oral QHS   amLODipine  10 mg Oral QHS   cloNIDine  0.1  mg Oral BID   docusate sodium  100 mg Oral BID   hydrALAZINE  37.5 mg Oral Q8H   insulin aspart  0-6 Units Subcutaneous TID WC   insulin glargine  5 Units Subcutaneous Daily   irbesartan  300 mg Oral Daily   isosorbide dinitrate  20 mg Oral TID   linagliptin  5 mg Oral Daily   pantoprazole  40 mg Oral Daily   polyvinyl alcohol  1 drop Both Eyes BID   ranolazine  500 mg Oral BID   rosuvastatin  20 mg Oral QHS   Continuous Infusions: PRN Meds: HYDROmorphone (DILAUDID) injection, HYDROmorphone, prochlorperazine  Time spent: 55 minutes  Author: Gillis Santa. MD [facility] 03/24/2023 2:32 PM  To reach On-call, see care teams to locate the attending and reach out to them via [email or web address]. If 7PM-7AM, please contact night-coverage If you still have difficulty reaching the attending provider, please page the [facility] (Director on Call) for [facility] on amion for assistance.

## 2023-03-25 LAB — GLUCOSE, CAPILLARY
Glucose-Capillary: 273 mg/dL — ABNORMAL HIGH (ref 70–99)
Glucose-Capillary: 284 mg/dL — ABNORMAL HIGH (ref 70–99)
Glucose-Capillary: 270 mg/dL — ABNORMAL HIGH (ref 70–99)
Glucose-Capillary: 258 mg/dL — ABNORMAL HIGH (ref 70–99)
Glucose-Capillary: 272 mg/dL — ABNORMAL HIGH (ref 70–99)

## 2023-03-25 LAB — CBC
HCT: 22.4 % — ABNORMAL LOW (ref 36.0–46.0)
Hemoglobin: 7.8 g/dL — ABNORMAL LOW (ref 12.0–15.0)
MCH: 33.3 pg (ref 26.0–34.0)
MCHC: 34.8 g/dL (ref 30.0–36.0)
MCV: 95.7 fL (ref 80.0–100.0)
Platelets: 147 10*3/uL — ABNORMAL LOW (ref 150–400)
RBC: 2.34 MIL/uL — ABNORMAL LOW (ref 3.87–5.11)
RDW: 12.4 % (ref 11.5–15.5)
WBC: 11.7 10*3/uL — ABNORMAL HIGH (ref 4.0–10.5)
nRBC: 0 % (ref 0.0–0.2)

## 2023-03-25 LAB — HEPATIC FUNCTION PANEL
ALT: 17 U/L (ref 0–44)
AST: 24 U/L (ref 15–41)
Albumin: 3.1 g/dL — ABNORMAL LOW (ref 3.5–5.0)
Alkaline Phosphatase: 40 U/L (ref 38–126)
Bilirubin, Direct: 0.2 mg/dL (ref 0.0–0.2)
Indirect Bilirubin: 0.4 mg/dL (ref 0.3–0.9)
Total Bilirubin: 0.6 mg/dL (ref 0.0–1.2)
Total Protein: 5.8 g/dL — ABNORMAL LOW (ref 6.5–8.1)

## 2023-03-25 LAB — PHOSPHORUS: Phosphorus: 3.4 mg/dL (ref 2.5–4.6)

## 2023-03-25 LAB — BASIC METABOLIC PANEL
Anion gap: 9 (ref 5–15)
BUN: 45 mg/dL — ABNORMAL HIGH (ref 8–23)
CO2: 23 mmol/L (ref 22–32)
Calcium: 8.5 mg/dL — ABNORMAL LOW (ref 8.9–10.3)
Chloride: 101 mmol/L (ref 98–111)
Creatinine, Ser: 1.95 mg/dL — ABNORMAL HIGH (ref 0.44–1.00)
GFR, Estimated: 23 mL/min — ABNORMAL LOW (ref >60)
Glucose, Bld: 281 mg/dL — ABNORMAL HIGH (ref 70–99)
Potassium: 3.9 mmol/L (ref 3.5–5.1)
Sodium: 133 mmol/L — ABNORMAL LOW (ref 135–145)

## 2023-03-25 LAB — MAGNESIUM: Magnesium: 1.7 mg/dL (ref 1.7–2.4)

## 2023-03-25 LAB — HEMOGLOBIN A1C
Hgb A1c MFr Bld: 7.9 % — ABNORMAL HIGH (ref 4.8–5.6)
Mean Plasma Glucose: 180 mg/dL

## 2023-03-25 LAB — CK: Total CK: 116 U/L (ref 38–234)

## 2023-03-25 MED ORDER — INSULIN GLARGINE 100 UNIT/ML ~~LOC~~ SOLN
10.0000 [IU] | Freq: Every day | SUBCUTANEOUS | Status: DC
  Administered 2023-03-26 (×2): 10 [IU] via SUBCUTANEOUS
  Filled 2023-03-25: qty 0.1

## 2023-03-25 MED ORDER — MAGNESIUM SULFATE 2 GM/50ML IV SOLN
2.0000 g | Freq: Once | INTRAVENOUS | Status: AC
Start: 2023-03-25 — End: 2023-03-25
  Administered 2023-03-25: 2 g via INTRAVENOUS
  Filled 2023-03-25: qty 50

## 2023-03-25 MED ORDER — INSULIN GLARGINE 100 UNIT/ML ~~LOC~~ SOLN
5.0000 [IU] | Freq: Once | SUBCUTANEOUS | Status: AC
  Administered 2023-03-25: 5 [IU] via SUBCUTANEOUS
  Filled 2023-03-25: qty 0.05

## 2023-03-25 NOTE — Evaluation (Signed)
 Occupational Therapy Evaluation Patient Details Name: Allison Duffy MRN: [id number] DOB: [birth year] Today's Date: 03/25/2023   History of Present Illness   Pt is a 89 yo female s/p fall with resultant fx of right humerus and wrist--currently in splint/sling awaiting surgery. PMH - CAD, HTN, HLD, chronic diastolic and systolic heart failure, afib     Clinical Impressions This 89 yo female admitted with above presents to acute OT with PLOF of being quite active, driving, doing her own ADLs and most IADLs. She currently is setup-total A/total A +2 for ADLs and mobility. She will continue to benefit from acute OT with follow up from continued inpatient follow up therapy, <3 hours/day.      If plan is discharge home, recommend the following:   A lot of help with walking and/or transfers;Two people to help with bathing/dressing/bathroom;Assistance with cooking/housework;Assistance with feeding;Help with stairs or ramp for entrance;Assist for transportation     Functional Status Assessment   Patient has had a recent decline in their functional status and demonstrates the ability to make significant improvements in function in a reasonable and predictable amount of time.     Equipment Recommendations   Other (comment) (TBD next venue)      Precautions/Restrictions   Precautions Precautions: Fall Recall of Precautions/Restrictions: Impaired Required Braces or Orthoses: Sling;Splint/Cast Splint/Cast: LUE wrist/forearm and immobilizer sling Restrictions Weight Bearing Restrictions Per Provider Order: Yes LUE Weight Bearing Per Provider Order: Non weight bearing     Mobility Bed Mobility Overal bed mobility: Needs Assistance Bed Mobility: Supine to Sit     Supine to sit: Max assist, HOB elevated     General bed mobility comments: Pt moved her LEs over to EOB with Mod A, then A'd to scoot around more with pad under her, then Mod A to come up to sit EOB with Max A to  scoot forward to get feet on floor with use of pad    Transfers Overall transfer level: Needs assistance Equipment used: 1 person hand held assist Transfers: Sit to/from Stand, Bed to chair/wheelchair/BSC Sit to Stand: Mod assist     Step pivot transfers: Mod assist     General transfer comment: Could not get gait belt on her without it coming up against her left hand (which is very painful), used bed pad under her to help her stand and kept tension/support on it while she held onto my left arm with her right arm and she slowly took steps to her right to go from bed to recliner. Once at recliner she reached back with VCs with her RUE and used this support and my support with bed pad under her to sit slowly down in recliner      Balance Overall balance assessment: Needs assistance Sitting-balance support: Feet supported, Single extremity supported Sitting balance-Leahy Scale: Poor Sitting balance - Comments: right lateral lean Postural control: Right lateral lean Standing balance support: Single extremity supported Standing balance-Leahy Scale: Poor Standing balance comment: reliant on me for balance                           ADL either performed or assessed with clinical judgement   ADL                                         General ADL Comments: total A with total A +2  for any ADL that requires standing or even lateral leans     Vision Baseline Vision/History: 1 Wears glasses Ability to See in Adequate Light: 0 Adequate Patient Visual Report: No change from baseline              Pertinent Vitals/Pain Pain Assessment Pain Assessment: Faces Faces Pain Scale: Hurts worst Pain Location: left hand when even slightly touched Pain Descriptors / Indicators: Stabbing, Sharp Pain Intervention(s): Limited activity within patient's tolerance, Monitored during session, Premedicated before session     Extremity/Trunk Assessment Upper Extremity  Assessment Upper Extremity Assessment: Right hand dominant;LUE deficits/detail LUE Deficits / Details: pt in splint and immobilizer sling, reports fingers are numb but they are very painful when touched LUE Coordination: decreased gross motor;decreased fine motor           Communication Communication Factors Affecting Communication: Hearing impaired   Cognition Arousal: Alert Behavior During Therapy: WFL for tasks assessed/performed Cognition: No apparent impairments                               Following commands: Intact       Cueing   Cueing Techniques: Verbal cues              Home Living Family/patient expects to be discharged to:: Skilled nursing facility Living Arrangements: Alone                                      Prior Functioning/Environment Prior Level of Function : Independent/Modified Independent;Driving             Mobility Comments: Uses hurrycane and sometimes a walker. Active      OT Problem List: Decreased strength;Decreased range of motion;Impaired balance (sitting and/or standing);Impaired UE functional use;Pain   OT Treatment/Interventions: Self-care/ADL training;Therapeutic activities;Therapeutic exercise;DME and/or AE instruction;Balance training;Patient/family education      OT Goals(Current goals can be found in the care plan section)   Acute Rehab OT Goals Patient Stated Goal: to have surgery and go to [facility] for rehab OT Goal Formulation: With patient/family Time For Goal Achievement: 04/08/23 Potential to Achieve Goals: Good   OT Frequency:  Min 3X/week       AM-PAC OT "6 Clicks" Daily Activity     Outcome Measure Help from another person eating meals?: A Lot Help from another person taking care of personal grooming?: A Lot Help from another person toileting, which includes using toliet, bedpan, or urinal?: Total Help from another person bathing (including washing, rinsing, drying)?:  Total Help from another person to put on and taking off regular upper body clothing?: Total Help from another person to put on and taking off regular lower body clothing?: Total 6 Click Score: 8   End of Session Equipment Utilized During Treatment:  (immobilizer sling) Nurse Communication: Mobility status (via secure chat)  Activity Tolerance: Patient limited by pain Patient left: in chair;with call bell/phone within reach (did not set chair alarm because PT was just getting ready to see her)  OT Visit Diagnosis: Unsteadiness on feet (R26.81);Other abnormalities of gait and mobility (R26.89);Muscle weakness (generalized) (M62.81);Pain Pain - Right/Left: Left Pain - part of body: Arm;Hand                Time: 1610-9604 OT Time Calculation (min): 37 min Charges:  OT General Charges $OT Visit: 1 Visit OT Evaluation $OT Eval  Moderate Complexity: 1 Mod OT Treatments $Self Care/Home Management : 8-22 mins  Lindon Romp OT Acute Rehabilitation Services Office [phone]    Evette Georges 03/25/2023, 3:47 PM

## 2023-03-25 NOTE — Progress Notes (Signed)
 Transition of Care [facility]) - Inpatient Brief Assessment   Patient Details  Name: CALANI GICK MRN: [id number] Date of Birth: 01-16-1927  Transition of [facility]) CM/SW Contact:    Janae Bridgeman, RN Phone Number: 03/25/2023, 10:27 AM   Clinical Narrative: CM met with the patient on the phone to discuss TOC needs.  The patient fell at home and fractured Left should and wrist and has a Left sling intact at this time.  The patient's daughter states that patient would like to go to [facility] and bed has been offered at the facility.  Bevely Palmer, CM with [facility] called and extended a bed offer.  MD placed PT/OT note at my request since patient has not been seen by therapy.  FL2 will be completed and faxed to [facility] [place].  MSw with TOc will continue to follow the patient for TOC needs.   Transition of Care Asessment: Insurance and Status: (P) Insurance coverage has been reviewed Patient has primary care physician: (P) Yes Home environment has been reviewed: (P) from home alone Prior level of function:: (P) Independent prior to fall Prior/Current Home Services: (P) No current home services Social Drivers of Health Review: (P) SDOH reviewed needs interventions Readmission risk has been reviewed: (P) Yes Transition of care needs: (P) transition of care needs identified, TOC will continue to follow

## 2023-03-25 NOTE — [place] FL2 (Signed)
 [place] MEDICAID FL2 LEVEL OF CARE FORM     IDENTIFICATION  Patient Name: Allison Duffy Birthdate: August 29, 1926 Sex: female Admission Date (Current Location): 03/23/2023  [facility] and [place] Number:  Producer, television/film/video and Address:  The [place], [address] [place], [place] [address]      Provider Number: [id number]  Attending Physician Name and Address:  Jeanne Ivan Agat*  Relative Name and Phone Number:  Smith,Connie (Daughter)  [phone]    Current Level of Care: Hospital Recommended Level of Care: Skilled Nursing Facility Prior Approval Number:    Date Approved/Denied:   PASRR Number: [id number]  Discharge Plan: SNF    Current Diagnoses: Patient Active Problem List   Diagnosis Date Noted   Intractable pain 03/23/2023   Atypical chest pain 07/24/2022   SOB (shortness of breath) 07/24/2022   Acute respiratory failure with hypoxia secondary to community acquired pneumonia 03/29/2021   Acute respiratory failure with hypoxia (HCC) 03/29/2021   Diarrhea 03/29/2021   Lactic acidosis 03/29/2021   Hypokalemia 03/29/2021   Coronary artery disease 04/13/2020   Unstable angina (HCC) 04/11/2020   Mitral regurgitation 03/01/2019   Acute on chronic diastolic CHF (congestive heart failure) (HCC) 01/28/2019   Paroxysmal atrial fibrillation (HCC) 01/28/2019   Colitis, acute 05/31/2016   Primary osteoarthritis of right shoulder 12/27/2014   Pulmonary nodule 06/23/2014   Essential hypertension 07/05/2013   Hyperlipidemia associated with type 2 diabetes mellitus (HCC) 07/05/2013   Type 2 diabetes mellitus with complication, without long-term current use of insulin (HCC) 07/05/2013   Chest pain CAD 07/05/2013    Orientation RESPIRATION BLADDER Height & Weight     Self, Place, Time  Normal External catheter, Incontinent Weight: 151 lb (68.5 kg) Height:  5\' 6"  (167.6 cm)  BEHAVIORAL SYMPTOMS/MOOD NEUROLOGICAL BOWEL NUTRITION STATUS       Continent Diet (see DC summary)  AMBULATORY STATUS COMMUNICATION OF NEEDS Skin   Extensive Assist Verbally Normal                       Personal Care Assistance Level of Assistance  Bathing, Feeding, Dressing Bathing Assistance: Maximum assistance Feeding assistance: Limited assistance Dressing Assistance: Maximum assistance     Functional Limitations Info  Sight, Hearing, Speech Sight Info: Impaired Hearing Info: Impaired Speech Info: Adequate    SPECIAL CARE FACTORS FREQUENCY  PT (By licensed PT), OT (By licensed OT)     PT Frequency: 5x/week OT Frequency: 5x/week            Contractures Contractures Info: Not present    Additional Factors Info  Code Status, Allergies Code Status Info: FULL Allergies Info: Codeine  Hydrocodone  Prednisone  Sulfa Antibiotics           Current Medications (03/25/2023):  This is the current hospital active medication list Current Facility-Administered Medications  Medication Dose Route Frequency Provider Last Rate Last Admin   acetaminophen (TYLENOL) tablet 650 mg  650 mg Oral Q6H Gillis Santa, MD   650 mg at 03/25/23 1113   Or   acetaminophen (TYLENOL) suppository 650 mg  650 mg Rectal Q6H Gillis Santa, MD       acidophilus (RISAQUAD) capsule 1 capsule  1 capsule Oral QHS Buena Irish, MD   1 capsule at 03/24/23 2228   amLODipine (NORVASC) tablet 10 mg  10 mg Oral QHS Buena Irish, MD   10 mg at 03/24/23 2228   cloNIDine (CATAPRES) tablet 0.1 mg  0.1 mg  Oral BID Buena Irish, MD   0.1 mg at 03/25/23 1610   docusate sodium (COLACE) capsule 100 mg  100 mg Oral BID Buena Irish, MD   100 mg at 03/25/23 9604   hydrALAZINE (APRESOLINE) tablet 37.5 mg  37.5 mg Oral Q8H Buena Irish, MD   37.5 mg at 03/25/23 1536   HYDROmorphone (DILAUDID) injection 0.5 mg  0.5 mg Intravenous Q4H PRN Gillis Santa, MD   0.5 mg at 03/25/23 1538   HYDROmorphone (DILAUDID) tablet 1 mg  1 mg Oral Q4H PRN Buena Irish, MD   1 mg at 03/25/23 1025   insulin aspart (novoLOG) injection 0-6 Units  0-6 Units Subcutaneous TID WC Buena Irish, MD   3 Units at 03/25/23 1246   [START ON 03/26/2023] insulin glargine (LANTUS) injection 10 Units  10 Units Subcutaneous Daily Mdala-Gausi, Masiku Agatha, MD       isosorbide dinitrate (ISORDIL) tablet 20 mg  20 mg Oral TID Buena Irish, MD   20 mg at 03/25/23 1537   linagliptin (TRADJENTA) tablet 5 mg  5 mg Oral Daily Buena Irish, MD   5 mg at 03/25/23 0819   pantoprazole (PROTONIX) EC tablet 40 mg  40 mg Oral Daily Buena Irish, MD   40 mg at 03/25/23 5409   polyvinyl alcohol (LIQUIFILM TEARS) 1.4 % ophthalmic solution 1 drop  1 drop Both Eyes BID Buena Irish, MD   1 drop at 03/25/23 0820   prochlorperazine (COMPAZINE) injection 2.5 mg  2.5 mg Intravenous Q6H PRN Buena Irish, MD   2.5 mg at 03/24/23 8119   rosuvastatin (CRESTOR) tablet 20 mg  20 mg Oral QHS Buena Irish, MD   20 mg at 03/24/23 2229     Discharge Medications: Please see discharge summary for a list of discharge medications.  Relevant Imaging Results:  Relevant Lab Results:   Additional Information SSN: [id number]  Muriah Harsha A [place], [name]

## 2023-03-25 NOTE — Progress Notes (Signed)
 Physical Therapy Treatment Patient Details Name: Allison Duffy MRN: [id number] DOB: 11-15-1926 Today's Date: 03/25/2023   History of Present Illness Pt is a 89 yo female presented on 03/23/23 s/p fall with resultant fx of right humerus and wrist--currently in splint/sling awaiting surgery. Per notes outpatient orthopedic recommended (per daughter may have surgery during admission on Friday). PMH - CAD, HTN, HLD, chronic diastolic and systolic heart failure, afib    PT Comments  Pt admitted with above diagnosis. At baseline, pt lives alone and is an active 89 yo.  Today, pt alert but groggy (felt to be due to meds) and has extremely painful L UE/wrist.  She required mod A for transfers and to ambulate 6' with HHA and mod A support under buttock/hip as unable to tolerate gait belt. Pt currently with functional limitations due to the deficits listed below (see PT Problem List). Pt will benefit from acute skilled PT to increase their independence and safety with mobility to allow discharge.  Due to level of assist required and living alone Patient will benefit from continued inpatient follow up therapy, <3 hours/day at d/c.      If plan is discharge home, recommend the following: A lot of help with walking and/or transfers;A lot of help with bathing/dressing/bathroom;Assistance with cooking/housework;Help with stairs or ramp for entrance   Can travel by private vehicle     No  Equipment Recommendations  Other (comment) (cane vs hemiwalker vs quad cane pending progress)    Recommendations for Other Services       Precautions / Restrictions Precautions Precautions: Fall Recall of Precautions/Restrictions: Impaired Required Braces or Orthoses: Sling;Splint/Cast Splint/Cast: LUE wrist/forearm and immobilizer sling Restrictions LUE Weight Bearing Per Provider Order: Non weight bearing     Mobility  Bed Mobility               General bed mobility comments: In chair at arrival - was  max A with OT    Transfers Overall transfer level: Needs assistance Equipment used: 1 person hand held assist Transfers: Sit to/from Stand Sit to Stand: Mod assist           General transfer comment: Pt could not tolerate application of gait belt as unable to get under L hand.  Used HHA and support under buttock to assist with transfers.   Required multiple attempt to arrise and mod A.    Ambulation/Gait Ambulation/Gait assistance: Mod assist, +2 safety/equipment Gait Distance (Feet): 6 Feet Assistive device: 1 person hand held assist Gait Pattern/deviations: Step-to pattern, Decreased stride length, Shuffle, Trunk flexed Gait velocity: decreased     General Gait Details: Provided HHA on R and support under buttock.  Pt able to take a few steps forward but fatigued easily.  Had chair follow   Stairs             Wheelchair Mobility     Tilt Bed    Modified Rankin (Stroke Patients Only)       Balance Overall balance assessment: Needs assistance Sitting-balance support: Feet supported, Single extremity supported Sitting balance-Leahy Scale: Poor Sitting balance - Comments: right lateral lean     Standing balance-Leahy Scale: Poor Standing balance comment: Needs R UE support and min/mod A                            Communication Communication Factors Affecting Communication: Hearing impaired  Cognition Arousal: Alert Behavior During Therapy: WFL for tasks assessed/performed   PT -  Cognitive impairments: No apparent impairments                       PT - Cognition Comments: Pt was alert and able to respond but groggy.  Daughter reports that is not baseline - likely due to meds        Cueing    Exercises      General Comments General comments (skin integrity, edema, etc.): VSS      Pertinent Vitals/Pain Pain Assessment Pain Assessment: Faces Faces Pain Scale: Hurts worst Pain Location: left hand when even slightly  touched Pain Descriptors / Indicators: Stabbing, Sharp Pain Intervention(s): Limited activity within patient's tolerance, Monitored during session, Premedicated before session, Repositioned    Home Living Family/patient expects to be discharged to:: Skilled nursing facility Living Arrangements: Alone                 Additional Comments: Pt from home and independent baseline    Prior Function            PT Goals (current goals can now be found in the care plan section) Acute Rehab PT Goals Patient Stated Goal: Return to wlaking; decrease pain PT Goal Formulation: With patient/family Time For Goal Achievement: 04/08/23 Potential to Achieve Goals: Good    Frequency    Min 1X/week      PT Plan      Co-evaluation              AM-PAC PT "6 Clicks" Mobility   Outcome Measure  Help needed turning from your back to your side while in a flat bed without using bedrails?: A Lot Help needed moving from lying on your back to sitting on the side of a flat bed without using bedrails?: A Lot Help needed moving to and from a bed to a chair (including a wheelchair)?: A Lot Help needed standing up from a chair using your arms (e.g., wheelchair or bedside chair)?: A Lot Help needed to walk in hospital room?: Total Help needed climbing 3-5 steps with a railing? : Total 6 Click Score: 10    End of Session   Activity Tolerance: Patient limited by pain Patient left: in chair;with chair alarm set;with call bell/phone within reach;with family/visitor present Nurse Communication: Mobility status (assist of 2 for safety) PT Visit Diagnosis: Other abnormalities of gait and mobility (R26.89);Muscle weakness (generalized) (M62.81)     Time: 1610-9604 PT Time Calculation (min) (ACUTE ONLY): 18 min  Charges:      PT General Charges $$ ACUTE PT VISIT: 1 Visit                     Anise Salvo, PT Acute Rehab [facility] [phone]    Rayetta Humphrey 03/25/2023,  4:19 PM

## 2023-03-25 NOTE — Progress Notes (Addendum)
 Progress Note   Patient: Allison Duffy UJW:[id number] DOB: 11/06/1926 DOA: 03/23/2023     2 DOS: the patient was seen and examined on 03/25/2023   Brief hospital course: 89 year old woman with PMH of paroxysmal atrial fibrillation on Eliquis, type 2 diabetes mellitus, hypertension, and coronary artery disease who is independent and lives alone, who presented to the ED after a fall at home. Patient found to have a left humerus and wrist fracture.  Seen by orthopedic surgery.  For definitive management as outpatient.  Assessment and Plan:  # Left humerus  and wrist fracture with intractable pain - Admitted for pain control.  Dilaudid is helping better than her Fentanyl dose.  - Monitoring closely given her advanced age and renal insufficiency. - Continue antiemetics as narcotics make her nauseated - Sling in place. Outpatient Ortho follow up.  # AKI on CKD stage IIIb Baseline creatinine 1.36. elevated to 1.95.  Creatinine is trending down.  Likely prerenal AKI due to poor p.o. intake. - Encourage oral fluids.  - Continue to monitor renal function - Avoid nephrotoxins.   # Anemia Possibly due to acute blood loss secondary to fracture. Hemoglobin is dropped from 12.2 on admission. Anemia workup not consistent with iron, B12 or folate deficiency. -Will continue to hold Eliquis. -Will ensure hemoglobin remains stable prior to discharge.  # Fall. Seen by PT. -Patient will be discharging to [facility].    # History of paroxysmal atrial fibrillation on Eliquis  - Holding Eliquis for now given fall and extensive bruising.  Resume at discharge.   # CAD, Hypertension and HLD -Continue home meds amlodipine 10 mg p.o. daily, clonidine 0.1 mg p.o. twice daily, hydralazine 37.5 mg p.o. 3 times daily, irbesartan 3 mg p.o. daily, Imdur 20 mg p.o. 3 times daily, Ranexa 500 mg p.o. twice daily -Crestor 20 mg p.o. daily   # Hypokalemia  - Replete as needed.  #  Hypomagnesemia Replete as needed..     # Hyponatremia most likely due to hyperglycemia Monitor sodium level daily   # Diabetes mellitus, uncontrolled blood sugar Continue Semglee and NovoLog sliding scale Monitor CBG, continue diabetic diet    # Hypocalcemia calcium 8.2 slightly low Vit D WNL - Will check albumin.        Subjective: Patient complains of left shoulder pain she continues to have bruising of her left shoulder.  Physical Exam: Vitals:   03/24/23 2224 03/25/23 0449 03/25/23 0755 03/25/23 1153  BP: (!) 143/112 (!) 144/53 (!) 136/51 (!) 152/58  Pulse: 94 79 81 81  Resp: 18 18 18 18   Temp:  98.2 F (36.8 C) 98.3 F (36.8 C) 98 F (36.7 C)  TempSrc:  Oral    SpO2: 92% 92% 97% 91%  Weight:      Height:       General: Alert, oriented X3  Eyes: Pupils equal, reactive  Oral cavity: moist mucous membranes  Head: Atraumatic, normocephalic  Neck: supple  Chest: clear to auscultation. No crackles, no wheezes  CVS: S1,S2 RRR. No murmurs  Abd: No distention, soft, non-tender. No masses palpable  Extr: Left shoulder bruising and swelling MSK: No joint deformities or swelling  Neurological: Grossly intact.     Data Reviewed:      Latest Ref Rng & Units 03/25/2023    6:48 AM 03/24/2023    8:35 AM 03/23/2023    2:17 PM  CBC  WBC 4.0 - 10.5 K/uL 11.7  11.1  10.1   Hemoglobin 12.0 -  15.0 g/dL 7.8  9.9  16.1   Hematocrit 36.0 - 46.0 % 22.4  27.9  34.0   Platelets 150 - 400 K/uL 147  178  222       Latest Ref Rng & Units 03/25/2023    6:48 AM 03/24/2023    8:35 AM 03/23/2023    2:17 PM  BMP  Glucose 70 - 99 mg/dL 096  045  409   BUN 8 - 23 mg/dL 45  29  30   Creatinine 0.44 - 1.00 mg/dL 8.11  9.14  7.82   Sodium 135 - 145 mmol/L 133  131  136   Potassium 3.5 - 5.1 mmol/L 3.9  3.7  3.3   Chloride 98 - 111 mmol/L 101  103  103   CO2 22 - 32 mmol/L 23  22  20    Calcium 8.9 - 10.3 mg/dL 8.5  8.2  9.4      Family Communication: spoke with daughter at  bedside  Disposition: Status is: Inpatient Remains inpatient appropriate because: She has Acute kidney injury and unstable hemoglobin.   Planned Discharge Destination: Skilled nursing facility    Time spent: 35 minutes  Author: MDALA-GAUSI, Gwenette Greet, MD 03/25/2023 2:43 PM  For on call review [email or web address].

## 2023-03-25 NOTE — Plan of Care (Signed)

## 2023-03-26 DIAGNOSIS — E7800 Pure hypercholesterolemia, unspecified: Secondary | ICD-10-CM

## 2023-03-26 DIAGNOSIS — I10 Essential (primary) hypertension: Secondary | ICD-10-CM

## 2023-03-26 DIAGNOSIS — I2583 Coronary atherosclerosis due to lipid rich plaque: Secondary | ICD-10-CM

## 2023-03-26 DIAGNOSIS — Z01818 Encounter for other preprocedural examination: Secondary | ICD-10-CM

## 2023-03-26 LAB — CBC
HCT: 22.6 % — ABNORMAL LOW (ref 36.0–46.0)
Hemoglobin: 7.9 g/dL — ABNORMAL LOW (ref 12.0–15.0)
MCH: 33.1 pg (ref 26.0–34.0)
MCHC: 35 g/dL (ref 30.0–36.0)
MCV: 94.6 fL (ref 80.0–100.0)
Platelets: 171 10*3/uL (ref 150–400)
RBC: 2.39 MIL/uL — ABNORMAL LOW (ref 3.87–5.11)
RDW: 12 % (ref 11.5–15.5)
WBC: 11.5 10*3/uL — ABNORMAL HIGH (ref 4.0–10.5)
nRBC: 0 % (ref 0.0–0.2)

## 2023-03-26 LAB — BASIC METABOLIC PANEL
Anion gap: 10 (ref 5–15)
BUN: 31 mg/dL — ABNORMAL HIGH (ref 8–23)
CO2: 24 mmol/L (ref 22–32)
Calcium: 8.8 mg/dL — ABNORMAL LOW (ref 8.9–10.3)
Chloride: 98 mmol/L (ref 98–111)
Creatinine, Ser: 1.43 mg/dL — ABNORMAL HIGH (ref 0.44–1.00)
GFR, Estimated: 34 mL/min — ABNORMAL LOW (ref >60)
Glucose, Bld: 296 mg/dL — ABNORMAL HIGH (ref 70–99)
Potassium: 3.7 mmol/L (ref 3.5–5.1)
Sodium: 132 mmol/L — ABNORMAL LOW (ref 135–145)

## 2023-03-26 LAB — GLUCOSE, CAPILLARY
Glucose-Capillary: 275 mg/dL — ABNORMAL HIGH (ref 70–99)
Glucose-Capillary: 325 mg/dL — ABNORMAL HIGH (ref 70–99)
Glucose-Capillary: 302 mg/dL — ABNORMAL HIGH (ref 70–99)
Glucose-Capillary: 165 mg/dL — ABNORMAL HIGH (ref 70–99)

## 2023-03-26 MED ORDER — INSULIN ASPART 100 UNIT/ML IJ SOLN
0.0000 [IU] | Freq: Three times a day (TID) | INTRAMUSCULAR | Status: DC
  Administered 2023-03-26: 11 [IU] via SUBCUTANEOUS
  Administered 2023-03-27: 3 [IU] via SUBCUTANEOUS
  Administered 2023-03-27: 5 [IU] via SUBCUTANEOUS
  Administered 2023-03-28: 11 [IU] via SUBCUTANEOUS
  Administered 2023-03-28: 2 [IU] via SUBCUTANEOUS
  Administered 2023-03-28 – 2023-03-29 (×2): 5 [IU] via SUBCUTANEOUS
  Administered 2023-03-29 (×2): 3 [IU] via SUBCUTANEOUS
  Administered 2023-03-30: 5 [IU] via SUBCUTANEOUS
  Administered 2023-03-30: 8 [IU] via SUBCUTANEOUS

## 2023-03-26 MED ORDER — INSULIN GLARGINE 100 UNIT/ML ~~LOC~~ SOLN
20.0000 [IU] | Freq: Every day | SUBCUTANEOUS | Status: DC
  Administered 2023-03-28 – 2023-03-30 (×3): 20 [IU] via SUBCUTANEOUS
  Filled 2023-03-26 (×4): qty 0.2

## 2023-03-26 MED ORDER — INSULIN GLARGINE 100 UNIT/ML ~~LOC~~ SOLN
10.0000 [IU] | Freq: Once | SUBCUTANEOUS | Status: AC
  Administered 2023-03-26: 10 [IU] via SUBCUTANEOUS
  Filled 2023-03-26: qty 0.1

## 2023-03-26 MED ORDER — CLONIDINE HCL 0.1 MG PO TABS
0.2000 mg | ORAL_TABLET | Freq: Every day | ORAL | Status: AC
Start: 2023-03-27 — End: ?
  Administered 2023-03-27 – 2023-03-30 (×4): 0.2 mg via ORAL
  Filled 2023-03-26 (×4): qty 2

## 2023-03-26 MED ORDER — CLONIDINE HCL 0.1 MG PO TABS
0.1000 mg | ORAL_TABLET | Freq: Every day | ORAL | Status: DC
  Administered 2023-03-27 – 2023-03-29 (×3): 0.1 mg via ORAL
  Filled 2023-03-26 (×3): qty 1

## 2023-03-26 NOTE — Consult Note (Signed)
 Cardiology Consultation   Patient ID: Allison Duffy MRN: [id number]; DOB: 1926/05/20  Admit date: 03/23/2023 Date of Consult: 03/26/2023  PCP:  Geoffry Paradise, MD    [facility] Providers Cardiologist:  Nanetta Batty, MD     Patient Profile:   Allison Duffy is a 89 y.o. female with a hx of paroxysmal atrial fibrillation, DM, HTN, CAD p/mLAD 50%, 2nd diag 75%, RI 50% who is being seen 03/26/2023 for the preop evaluation at the request of Dr. Kirke Corin.  History of Present Illness:   Allison Duffy is a 89 yo female with PMH noted above. She has been followed by Dr. Allyson Sabal as an outpatient.  She was initially referred in the setting of shortness of breath and fatigue.  Underwent echocardiogram and Myoview in 2015 which were normal.  Underwent coronary CTA 03/2020 which showed Neri calcium score of 2614.  This prompted cardiac catheterization which showed proximal/mid LAD 50%, second diagonal 75%, ramus intermediate 50% which was treated medically.  Echocardiogram 06/2022 with LVEF of 60 to 65%, moderate basal septal hypertrophy, grade 2 diastolic dysfunction, normal RV, mild MR.  Last seen in the office 11/2022 for follow-up with Dr. Gery Pray and reported continuing to do well.  She remained independent, drives and cooks for herself at home.  She was continued on amlodipine, hydralazine, isosorbide and Benicar as well as statin therapy.  She does have a history of paroxysmal atrial fibrillation but was maintaining sinus rhythm on Eliquis.  She presented to the ED on 2/24 after a fall.  Reports she was outside cleaning her dryer vent and accidentally stepped in a small hole in her flower garden.  She attempted to catch herself with her left arm.  Chest x-rays in the ED showed proximal humerus and distal fractures.  Orthopedic surgery was consulted, she is scheduled for reverse total shoulder arthroplasty tomorrow with Dr. Aundria Rud.  Cardiology asked to evaluate.   In talking with  patient/family she has remained independent and living at home.  No reports of chest pain, mild shortness of breath at times.  Denies any syncope, palpitations or lightheadedness prior to admission.  Labs on admission showed sodium 136, potassium 3.3, creatinine 1.4, WBC 10.1, hemoglobin 12.2>>9.9>>7.8.  EKG showed sinus tachycardia, 101bpm nonspecific changes.  Past Medical History:  Diagnosis Date   Acute on chronic combined systolic and diastolic CHF (congestive heart failure) (HCC) 01/28/2019   Arthritis    "scattered joints" (07/29/2013)   Chest pain    Colitis    Gout attack ?1980's   Hyperlipidemia    Hypertension    PONV (postoperative nausea and vomiting)    Type II diabetes mellitus (HCC)     Past Surgical History:  Procedure Laterality Date   APPENDECTOMY     CARPAL TUNNEL RELEASE Right    CATARACT EXTRACTION W/ INTRAOCULAR LENS  IMPLANT, BILATERAL Bilateral    CHOLECYSTECTOMY     KNEE ARTHROSCOPY Right    LEFT HEART CATH AND CORONARY ANGIOGRAPHY N/A 04/12/2020   Procedure: LEFT HEART CATH AND CORONARY ANGIOGRAPHY;  Surgeon: Runell Gess, MD;  Location: [facility];  Service: Cardiovascular;  Laterality: N/A;   OLECRANON BURSECTOMY Left 07/29/2013   "in ER"   PARATHYROIDECTOMY     TONSILLECTOMY     TOTAL SHOULDER REPLACEMENT  2017   VAGINAL HYSTERECTOMY       Inpatient Medications: Scheduled Meds:  acetaminophen  650 mg Oral Q6H   Or   acetaminophen  650 mg Rectal Q6H  acidophilus  1 capsule Oral QHS   amLODipine  10 mg Oral QHS   [START ON 03/27/2023] cloNIDine  0.1 mg Oral QHS   [START ON 03/27/2023] cloNIDine  0.2 mg Oral Daily   docusate sodium  100 mg Oral BID   hydrALAZINE  37.5 mg Oral Q8H   insulin aspart  0-15 Units Subcutaneous TID WC   [START ON 03/27/2023] insulin glargine  20 Units Subcutaneous Daily   isosorbide dinitrate  20 mg Oral TID   linagliptin  5 mg Oral Daily   pantoprazole  40 mg Oral Daily   polyvinyl alcohol  1 drop Both Eyes  BID   Continuous Infusions:  PRN Meds: HYDROmorphone (DILAUDID) injection, HYDROmorphone, prochlorperazine  Allergies:    Allergies  Allergen Reactions   Codeine Nausea And Vomiting   Hydrocodone Nausea And Vomiting    Nausea and vomiting   Prednisone     Other reaction(s): sick on her stomach   Sulfa Antibiotics Itching, Nausea Only and Other (See Comments)    Social History:   Social History   Socioeconomic History   Marital status: Widowed    Spouse name: 4 children and 3 living   Number of children: 4   Years of education: Not on file   Highest education level: Not on file  Occupational History   Occupation: Retired  Tobacco Use   Smoking status: Former    Current packs/day: 0.00    Types: Cigarettes    Start date: 07/05/1948    Quit date: 07/05/1953    Years since quitting: 69.7   Smokeless tobacco: Never  Vaping Use   Vaping status: Never Used  Substance and Sexual Activity   Alcohol use: No   Drug use: No   Sexual activity: Not Currently  Other Topics Concern   Not on file  Social History Narrative   Lives independently in her own home. Daughter, [name], is 5 min. away and her grandson lives next door.   Social Drivers of Corporate investment banker Strain: Not on file  Food Insecurity: No Food Insecurity (03/23/2023)   Hunger Vital Sign    Worried About Running Out of Food in the Last Year: Never true    Ran Out of Food in the Last Year: Never true  Transportation Needs: No Transportation Needs (03/23/2023)   PRAPARE - Administrator, Civil Service (Medical): No    Lack of Transportation (Non-Medical): No  Physical Activity: Not on file  Stress: Not on file  Social Connections: Moderately Integrated (03/23/2023)   Social Connection and Isolation Panel [NHANES]    Frequency of Communication with Friends and Family: Three times a week    Frequency of Social Gatherings with Friends and Family: Twice a week    Attends Religious Services: More  than 4 times per year    Active Member of [employer] or Organizations: Yes    Attends Banker Meetings: Never    Marital Status: Widowed  Intimate Partner Violence: Not At Risk (03/23/2023)   Humiliation, Afraid, Rape, and Kick questionnaire    Fear of Current or Ex-Partner: No    Emotionally Abused: No    Physically Abused: No    Sexually Abused: No    Family History:    Family History  Problem Relation Age of Onset   Hypertension Father    Heart disease Father    Heart disease Mother    Diabetes Mother    Hypertension Mother    Heart disease  Brother        both brothers   Diabetes Brother    Hypertension Brother      ROS:  Please see the history of present illness.   All other ROS reviewed and negative.     Physical Exam/Data:   Vitals:   03/26/23 0005 03/26/23 0456 03/26/23 0740 03/26/23 1525  BP: (!) 124/92 (!) 182/64 (!) 175/66 (!) 144/55  Pulse: 95 92 87 80  Resp: 18 18 18 18   Temp: 98.7 F (37.1 C) 98.9 F (37.2 C) 98 F (36.7 C) 98 F (36.7 C)  TempSrc: Oral Oral    SpO2: 94% 92% 90% 92%  Weight:      Height:        Intake/Output Summary (Last 24 hours) at 03/26/2023 1653 Last data filed at 03/26/2023 1432 Gross per 24 hour  Intake --  Output 2900 ml  Net -2900 ml      03/23/2023    2:02 PM 12/03/2022    2:28 PM 08/27/2022    9:48 AM  Last 3 Weights  Weight (lbs) 151 lb 152 lb 149 lb 12.8 oz  Weight (kg) 68.493 kg 68.947 kg 67.949 kg     Body mass index is 24.37 kg/m.  General:  Thin, older female.  HEENT: normal Neck: no JVD Vascular: No carotid bruits; Distal pulses 2+ bilaterally Cardiac:  normal S1, S2; RRR; 2/6 systolic murmur RUSB Lungs:  clear to auscultation bilaterally anteriorly Abd: soft, nontender, no hepatomegaly  Ext: no edema Musculoskeletal: Left arm in sling Skin: warm and dry  Neuro:  CNs 2-12 intact, no focal abnormalities noted Psych:  Normal affect   EKG:  The EKG was personally reviewed and demonstrates:   Sinus Tachycardia 101bpm Telemetry:  Telemetry was personally reviewed and demonstrates:  N/a   Relevant CV Studies:  Echo: 06/2022  IMPRESSIONS     1. Left ventricular ejection fraction, by estimation, is 60 to 65%. The  left ventricle has normal function. The left ventricle has no regional  wall motion abnormalities. There is moderate basal septal hypertrophy. The  rest of the LV segments demonstrate   mild concentric left ventricular hypertrophy of the basal-septal segment.  Left ventricular diastolic parameters are consistent with Grade II  diastolic dysfunction (pseudonormalization).   2. Right ventricular systolic function is normal. The right ventricular  size is normal.   3. Left atrial size was moderately dilated.   4. The mitral valve is grossly normal. Mild mitral valve regurgitation.   5. The aortic valve is tricuspid. There is mild calcification of the  aortic valve. There is mild thickening of the aortic valve. Aortic valve  regurgitation is not visualized. Aortic valve sclerosis/calcification is  present, without any evidence of  aortic stenosis.   6. The inferior vena cava is normal in size with greater than 50%  respiratory variability, suggesting right atrial pressure of 3 mmHg.   Comparison(s): No significant change from prior study.   FINDINGS   Left Ventricle: Left ventricular ejection fraction, by estimation, is 60  to 65%. The left ventricle has normal function. The left ventricle has no  regional wall motion abnormalities. The left ventricular internal cavity  size was normal in size. There is   moderate basal septal hypertrophy. The rest of the LV segments  demonstrate mild concentric left ventricular hypertrophy of the  basal-septal segment. Left ventricular diastolic parameters are consistent  with Grade II diastolic dysfunction  (pseudonormalization).   Right Ventricle: The right ventricular size is  normal. No increase in  right ventricular wall  thickness. Right ventricular systolic function is  normal.   Left Atrium: Left atrial size was moderately dilated.   Right Atrium: Right atrial size was normal in size.   Pericardium: There is no evidence of pericardial effusion.   Mitral Valve: The mitral valve is grossly normal. There is mild thickening  of the mitral valve leaflet(s). There is mild calcification of the mitral  valve leaflet(s). Mild mitral valve regurgitation. MV peak gradient, 5.5  mmHg. The mean mitral valve  gradient is 2.0 mmHg.   Tricuspid Valve: The tricuspid valve is normal in structure. Tricuspid  valve regurgitation is mild.   Aortic Valve: The aortic valve is tricuspid. There is mild calcification  of the aortic valve. There is mild thickening of the aortic valve. Aortic  valve regurgitation is not visualized. Aortic valve  sclerosis/calcification is present, without any  evidence of aortic stenosis.   Pulmonic Valve: The pulmonic valve was normal in structure. Pulmonic valve  regurgitation is trivial.   Aorta: The aortic root is normal in size and structure.   Venous: The inferior vena cava is normal in size with greater than 50%  respiratory variability, suggesting right atrial pressure of 3 mmHg.   IAS/Shunts: The atrial septum is grossly normal.   Cath: 03/2020  Prox LAD to Mid LAD lesion is 50% stenosed. 2nd Diag lesion is 75% stenosed. Ramus lesion is 50% stenosed.  IMPRESSION: Allison Duffy has noncritical CAD with normal filling pressures and normal LV function by 2D echo.  There are no culprit lesions identified.  Medical therapy will be recommended.  The sheath was removed and a TR band was placed on the right wrist to achieve patent hemostasis.  The patient left lab stable condition.   Nanetta Batty. MD, [facility] 04/12/2020 5:20 PM  Diagnostic Dominance: Right   Laboratory Data:  High Sensitivity Troponin:  No results for input(s): "TROPONINIHS" in the last 720 hours.    Chemistry Recent Labs  Lab 03/24/23 [phone] 03/24/23 0835 03/25/23 0648 03/26/23 0719  NA  --  131* 133* 132*  K  --  3.7 3.9 3.7  CL  --  103 101 98  CO2  --  22 23 24   GLUCOSE  --  369* 281* 296*  BUN  --  29* 45* 31*  CREATININE  --  1.48* 1.95* 1.43*  CALCIUM  --  8.2* 8.5* 8.8*  MG 1.8 1.7 1.7  --   GFRNONAA  --  33* 23* 34*  ANIONGAP  --  6 9 10     Recent Labs  Lab 03/25/23 0647  PROT 5.8*  ALBUMIN 3.1*  AST 24  ALT 17  ALKPHOS 40  BILITOT 0.6   Lipids No results for input(s): "CHOL", "TRIG", "HDL", "LABVLDL", "LDLCALC", "CHOLHDL" in the last 168 hours.  Hematology Recent Labs  Lab 03/24/23 0835 03/25/23 0648 03/26/23 0719  WBC 11.1* 11.7* 11.5*  RBC 2.98* 2.34* 2.39*  HGB 9.9* 7.8* 7.9*  HCT 27.9* 22.4* 22.6*  MCV 93.6 95.7 94.6  MCH 33.2 33.3 33.1  MCHC 35.5 34.8 35.0  RDW 12.3 12.4 12.0  PLT 178 147* 171   Thyroid  Recent Labs  Lab 03/24/23 0607  TSH 1.221    BNPNo results for input(s): "BNP", "PROBNP" in the last 168 hours.  DDimer No results for input(s): "DDIMER" in the last 168 hours.   Radiology/Studies:  CT Shoulder Left Wo Contrast Result Date: 03/23/2023 CLINICAL DATA:  Shoulder trauma with  fracture seen on prior radiographs. EXAM: CT OF THE UPPER LEFT EXTREMITY WITHOUT CONTRAST TECHNIQUE: Multidetector CT imaging of the upper left extremity was performed according to the standard protocol. RADIATION DOSE REDUCTION: This exam was performed according to the departmental dose-optimization program which includes automated exposure control, adjustment of the mA and/or kV according to patient size and/or use of iterative reconstruction technique. COMPARISON:  Left shoulder radiographs 03/23/2023 FINDINGS: Bones/Joint/Cartilage Mostly transverse comminuted and impacted fracture of the humeral neck with fracture lines extending to the humeral head. Varus angulation of fracture fragments. Displaced greater and lesser tuberosity fragments. Fracture  lines do not appear to extend to the glenohumeral joint surface. Underlying degenerative changes are demonstrated in the glenohumeral and acromioclavicular joints with prominent osteophyte formation on both sides of the joints. Moderate-sized effusion with fat fluid level consistent with hemarthrosis. Calcification in the subcoracoid bursa likely represents a pre-existing loose body although could indicate a displaced fracture fragment. Cartilaginous calcification. Ligaments Suboptimally assessed by CT. Muscles and Tendons Intramuscular hematomas in the shoulder musculature. Soft tissues Prominent soft tissue swelling about the left shoulder. IMPRESSION: Comminuted and impacted fractures of the humeral neck with fracture lines extending to the humeral head. Varus angulation. Displaced greater and lesser tuberosity fragments. Underlying prominent degenerative changes. Hemarthrosis and joint effusion with intramuscular hematoma and diffuse soft tissue swelling. Electronically Signed   By: Burman Nieves M.D.   On: 03/23/2023 21:06   CT ELBOW LEFT WO CONTRAST Result Date: 03/23/2023 CLINICAL DATA:  Trauma/fall EXAM: CT OF THE UPPER LEFT EXTREMITY WITHOUT CONTRAST TECHNIQUE: Multidetector CT imaging of the upper left extremity was performed according to the standard protocol. RADIATION DOSE REDUCTION: This exam was performed according to the departmental dose-optimization program which includes automated exposure control, adjustment of the mA and/or kV according to patient size and/or use of iterative reconstruction technique. COMPARISON:  Left elbow radiographs dated 03/23/2023 FINDINGS: No fracture or dislocation is seen. Elbow joint space is preserved. No elbow joint effusion. Mild degenerative spurring along the olecranon. Mild soft tissue swelling along the dorsal aspect of the proximal ulna (series 11/image 22). IMPRESSION: No fracture or dislocation is seen. Mild dorsal soft tissue swelling. Electronically  Signed   By: Charline Bills M.D.   On: 03/23/2023 21:04   CT Wrist Left Wo Contrast Result Date: 03/23/2023 CLINICAL DATA:  Fracture of the left wrist seen on prior radiographs. EXAM: CT OF THE LEFT WRIST WITHOUT CONTRAST TECHNIQUE: Multidetector CT imaging was performed according to the standard protocol. Multiplanar CT image reconstructions were also generated. RADIATION DOSE REDUCTION: This exam was performed according to the departmental dose-optimization program which includes automated exposure control, adjustment of the mA and/or kV according to patient size and/or use of iterative reconstruction technique. COMPARISON:  Left wrist radiographs 03/23/2023 FINDINGS: Bones/Joint/Cartilage Comminuted fractures of the distal left radius with fracture lines extending to the radiocarpal and radioulnar joints. There is impaction of the fractures with mild displacement of fracture fragments, particularly involving the radial styloid process. Minimally displaced fractures of the ulnar styloid process. No definite carpal bone fractures. Degenerative changes are demonstrated in the carpus. Calcification of the triangular fibrocartilage and of the scapholunate ligament. Ligaments Suboptimally assessed by CT. Muscles and Tendons No intramuscular mass or hematoma demonstrated. Soft tissues Mild soft tissue swelling. IMPRESSION: Comminuted and impacted fractures of the distal left radius with fracture lines extending to the radiocarpal and radioulnar joints. Minimally displaced fracture of the ulnar styloid process. Degenerative changes in the carpus. Electronically Signed   By:  Burman Nieves M.D.   On: 03/23/2023 21:00   CT Head Wo Contrast Result Date: 03/23/2023 CLINICAL DATA:  Trauma, fall EXAM: CT HEAD WITHOUT CONTRAST CT CERVICAL SPINE WITHOUT CONTRAST TECHNIQUE: Multidetector CT imaging of the head and cervical spine was performed following the standard protocol without intravenous contrast. Multiplanar CT  image reconstructions of the cervical spine were also generated. RADIATION DOSE REDUCTION: This exam was performed according to the departmental dose-optimization program which includes automated exposure control, adjustment of the mA and/or kV according to patient size and/or use of iterative reconstruction technique. COMPARISON:  CT head and CT neck 11/07/2021. FINDINGS: CT HEAD FINDINGS Brain: No acute intracranial hemorrhage. No CT evidence of acute infarct. 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 Nonspecific hypoattenuation in the periventricular and subcortical white matter favored to reflect chronic microvascular ischemic changes. No edema, mass effect, or midline shift. The basilar cisterns are patent. Ventricles: Prominence of the ventricles suggesting underlying parenchymal volume loss. Vascular: Atherosclerotic calcification of the bilateral carotid siphons and intracranial vertebral arteries. No hyperdense vessel. Skull: No acute or aggressive finding. Orbits: Orbits are symmetric. Sinuses: The visualized paranasal sinuses are clear. Other: Mastoid air cells are clear. CT CERVICAL SPINE FINDINGS Alignment: Cervical lordosis is maintained. No listhesis. No facet subluxation or dislocation. Skull base and vertebrae: No compression fracture or displaced fracture in the cervical spine. No focal pathologic lesion. Soft tissues and spinal canal: No prevertebral fluid or swelling. No visible canal hematoma. Disc levels: Intervertebral disc space narrowing at multiple levels greatest at C6-7. Disc osteophyte complexes at multiple levels in the cervical spine. No high-grade osseous spinal canal stenosis. Facet arthrosis at multiple levels. Foraminal narrowing most pronounced bilaterally at C5-6 and C6-7. Upper chest: Negative. Other: None. IMPRESSION: 1. No acute intracranial abnormality. 2. No compression fracture or displaced fracture in the cervical spine. No traumatic  malalignment. 3. 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 4. Mild chronic microvascular ischemic changes and parenchymal volume loss. Electronically Signed   By: Emily Filbert M.D.   On: 03/23/2023 18:47   CT Cervical Spine Wo Contrast Result Date: 03/23/2023 CLINICAL DATA:  Trauma, fall EXAM: CT HEAD WITHOUT CONTRAST CT CERVICAL SPINE WITHOUT CONTRAST TECHNIQUE: Multidetector CT imaging of the head and cervical spine was performed following the standard protocol without intravenous contrast. Multiplanar CT image reconstructions of the cervical spine were also generated. RADIATION DOSE REDUCTION: This exam was performed according to the departmental dose-optimization program which includes automated exposure control, adjustment of the mA and/or kV according to patient size and/or use of iterative reconstruction technique. COMPARISON:  CT head and CT neck 11/07/2021. FINDINGS: CT HEAD FINDINGS Brain: No acute intracranial hemorrhage. No CT evidence of acute infarct. 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 Nonspecific hypoattenuation in the periventricular and subcortical white matter favored to reflect chronic microvascular ischemic changes. No edema, mass effect, or midline shift. The basilar cisterns are patent. Ventricles: Prominence of the ventricles suggesting underlying parenchymal volume loss. Vascular: Atherosclerotic calcification of the bilateral carotid siphons and intracranial vertebral arteries. No hyperdense vessel. Skull: No acute or aggressive finding. Orbits: Orbits are symmetric. Sinuses: The visualized paranasal sinuses are clear. Other: Mastoid air cells are clear. CT CERVICAL SPINE FINDINGS Alignment: Cervical lordosis is maintained. No listhesis. No facet subluxation or dislocation. Skull base and vertebrae: No compression fracture or displaced fracture in the cervical spine. No focal pathologic lesion. Soft tissues and spinal canal: No prevertebral  fluid or swelling. No visible canal hematoma. Disc levels:  Intervertebral disc space narrowing at multiple levels greatest at C6-7. Disc osteophyte complexes at multiple levels in the cervical spine. No high-grade osseous spinal canal stenosis. Facet arthrosis at multiple levels. Foraminal narrowing most pronounced bilaterally at C5-6 and C6-7. Upper chest: Negative. Other: None. IMPRESSION: 1. No acute intracranial abnormality. 2. No compression fracture or displaced fracture in the cervical spine. No traumatic malalignment. 3. 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 4. Mild chronic microvascular ischemic changes and parenchymal volume loss. Electronically Signed   By: Emily Filbert M.D.   On: 03/23/2023 18:47   DG Wrist Complete Left Result Date: 03/23/2023 CLINICAL DATA:  Fall onto left shoulder and left hip. Left elbow and wrist pain. EXAM: LEFT WRIST - COMPLETE 3+ VIEW; LEFT ELBOW - COMPLETE 3+ VIEW COMPARISON:  Shoulder radiographs same date. Elbow radiographs 07/29/2013. FINDINGS: Left elbow: Positioning limited by the patient's shoulder injury. The bones are demineralized. No definite acute fracture, dislocation or significant joint effusion identified. Chronic spurring of the olecranon process. Possible soft tissue swelling dorsally in the proximal forearm. Left wrist: The bones are demineralized. There is an acute, comminuted and impacted intra-articular fracture of the distal radius. There is a mildly displaced acute fracture of the ulnar styloid. The carpal bones appear intact and normally aligned. Moderately advanced degenerative changes at the 1st carpometacarpal joint. Scattered additional mild degenerative changes elsewhere in the wrist and throughout the visualized fingers. Soft tissue swelling about the wrist without evidence of foreign body or soft tissue emphysema. IMPRESSION: 1. Acute, comminuted and impacted intra-articular fracture of the distal radius. 2. Acute mildly  displaced fracture of the ulnar styloid. 3. No definite acute fracture or dislocation of the left elbow. Limited positioning due to the patient's shoulder injury. Electronically Signed   By: Carey Bullocks M.D.   On: 03/23/2023 16:59   DG Elbow Complete Left Result Date: 03/23/2023 CLINICAL DATA:  Fall onto left shoulder and left hip. Left elbow and wrist pain. EXAM: LEFT WRIST - COMPLETE 3+ VIEW; LEFT ELBOW - COMPLETE 3+ VIEW COMPARISON:  Shoulder radiographs same date. Elbow radiographs 07/29/2013. FINDINGS: Left elbow: Positioning limited by the patient's shoulder injury. The bones are demineralized. No definite acute fracture, dislocation or significant joint effusion identified. Chronic spurring of the olecranon process. Possible soft tissue swelling dorsally in the proximal forearm. Left wrist: The bones are demineralized. There is an acute, comminuted and impacted intra-articular fracture of the distal radius. There is a mildly displaced acute fracture of the ulnar styloid. The carpal bones appear intact and normally aligned. Moderately advanced degenerative changes at the 1st carpometacarpal joint. Scattered additional mild degenerative changes elsewhere in the wrist and throughout the visualized fingers. Soft tissue swelling about the wrist without evidence of foreign body or soft tissue emphysema. IMPRESSION: 1. Acute, comminuted and impacted intra-articular fracture of the distal radius. 2. Acute mildly displaced fracture of the ulnar styloid. 3. No definite acute fracture or dislocation of the left elbow. Limited positioning due to the patient's shoulder injury. Electronically Signed   By: Carey Bullocks M.D.   On: 03/23/2023 16:59   DG Pelvis Portable Result Date: 03/23/2023 CLINICAL DATA:  Fall. EXAM: PORTABLE PELVIS 1-2 VIEWS COMPARISON:  CT abdomen/pelvis dated March 20, 2022. FINDINGS: There is no evidence of pelvic fracture or diastasis. Mild-to-moderate degenerative changes of the  bilateral hips. Sacroiliac joints and pubic symphysis appear anatomically aligned with degenerative changes. IMPRESSION: No acute osseous abnormality identified. Electronically Signed   By: Hart Robinsons M.D.   On:  03/23/2023 15:54   DG Chest Port 1 View Result Date: 03/23/2023 CLINICAL DATA:  Fall. EXAM: PORTABLE CHEST 1 VIEW COMPARISON:  Chest radiograph dated 07/23/2022. FINDINGS: Low lung volumes with asymmetric elevation of the right hemidiaphragm. The cardiomediastinal silhouette is within normal limits. Aortic atherosclerosis. No focal consolidation, sizeable pleural effusion, or pneumothorax. Prior right reverse shoulder arthroplasty. Surgical clips overlie the superior mediastinum. No acute osseous abnormality identified. IMPRESSION: Low lung volumes with elevation of the right hemidiaphragm. Otherwise, no acute findings in the chest. Electronically Signed   By: Hart Robinsons M.D.   On: 03/23/2023 15:52   DG Shoulder Left Result Date: 03/23/2023 CLINICAL DATA:  Left shoulder pain status post fall. EXAM: LEFT SHOULDER - 2+ VIEW COMPARISON:  07/23/2022. FINDINGS: Diffuse osseous demineralization. Acute comminuted impacted fracture of the left proximal humerus head and neck junction at the level of the surgical neck with mild cephalad displacement of the humeral shaft component. The glenohumeral joint appears aligned with advanced degenerative changes including large bulky inferior humeral head osteophytosis. The acromioclavicular joint is anatomically aligned with mild degenerative changes. IMPRESSION: 1. Acute comminuted, impacted and displaced fracture of the left proximal humerus head and neck junction. 2. Advanced degenerative changes of the left glenohumeral joint. Electronically Signed   By: Hart Robinsons M.D.   On: 03/23/2023 15:49     Assessment and Plan:   Allison Duffy is a 89 y.o. female with a hx of paroxysmal atrial fibrillation, DM, HTN, CAD p/mLAD 50%, 2nd diag 75%, RI  50% who is being seen 03/26/2023 for the preop evaluation at the request of Dr. Kirke Corin.  Preop Evaluation Left humerus/wrist fracture Fall -- Currently lives at home independently, stepped in a hole and lost her balance.  Attempted to catch herself with her left arm.  X-ray showed proximal humerus and distal radius fractures.  Orthopedics consulted with plans to take for surgery tomorrow. -- Denies any chest pain or shortness of breath prior to admission.  She does have a soft murmur on exam. -- Will check echocardiogram to ensure no significant valvular disease with plans for surgery  CAD  -- Cardiac catheterization 03/2020 with proximal/mid LAD of 50%, second diagonal 75%, ramus intermedius 50% which was treated medically -- Denies any chest pain prior to admission -- EKG on admission showed sinus tachycardia, nonspecific changes  Paroxsymal atrial fibrillation -- In sinus rhythm on admission, not currently on telemetry but sounds regular on exam -- Eliquis held with the need for upcoming surgery  Anemia -- Hemoglobin 12.2>>9.9>>7.8>>7.9 admission -- Anemia panel: Iron 18 -- Eliquis held in anticipation of surgery -- Management per primary, thought would keep hgb >8 given hx of CAD  Hypertension -- Continue amlodipine 10 mg daily, clonidine 0.2mg  am/ 0.1mg  pm, hydralazine 37.5 mg 3 times daily, Isordil 20 mg daily  Risk Assessment/Risk Scores:    CHA2DS2-VASc Score = 6   This indicates a 9.7% annual risk of stroke. The patient's score is based upon: CHF History: 1 HTN History: 1 Diabetes History: 1 Stroke History: 0 Vascular Disease History: 0 Age Score: 2 Gender Score: 1   For questions or updates, please contact [facility] Please consult [email or web address] for contact info under    Signed, Laverda Page, NP  03/26/2023 4:53 PM

## 2023-03-26 NOTE — Plan of Care (Signed)

## 2023-03-26 NOTE — Progress Notes (Addendum)
 Progress Note   Patient: Allison Duffy:[id number] DOB: 1926-02-26 DOA: 03/23/2023     3 DOS: the patient was seen and examined on 03/26/2023   Brief hospital course: 89 year old woman with PMH of paroxysmal atrial fibrillation on Eliquis, type 2 diabetes mellitus, hypertension, and coronary artery disease who is independent and lives alone, who presented to the ED after a fall at home. Patient found to have a left humerus and wrist fracture.  Seen by orthopedic surgery.  Surgery planned for 03/27/2023.  Assessment and Plan:  # Left humerus  and wrist fracture with intractable pain  -On Dilaudid, fentanyl. -Seen by orthopedics.  Surgery planned for 03/27/2023. -N.p.o. after midnight. - Monitoring closely given her advanced age and renal insufficiency. - Continue antiemetics as narcotics make her nauseated -Preoperative cardiac clearance given extensive cardiac history.  # AKI on CKD stage IIIb Baseline creatinine 1.36. peaked at 1.95.  Creatinine is trending down.  Likely prerenal AKI due to poor p.o. intake. - Encourage oral fluids.  - Continue to monitor renal function - Avoid nephrotoxins.   # Anemia Likely due to acute blood loss secondary to fracture. Hemoglobin dropped from 12.2 on admission and has now stabilized.  Anemia workup not consistent with iron, B12 or folate deficiency. -Will continue to hold Eliquis. -Will ensure hemoglobin remains stable prior to discharge.  # Fall. Seen by PT. -Patient will be discharging to [facility].    # History of paroxysmal atrial fibrillation on Eliquis  - Holding Eliquis for now given fall and extensive bruising.  Resume at discharge.   # CAD, Hypertension and HLD -Continue home meds amlodipine 10 mg p.o. daily, clonidine 0.1 mg p.o. twice daily, hydralazine 37.5 mg p.o. 3 times daily, Imdur 20 mg p.o. 3 times daily, Ranexa 500 mg p.o. twice daily -Will DC crestor given renal impairment.    # Hypokalemia  -  Replete as needed.  # Hypomagnesemia Replete as needed..     # Hyponatremia most likely due to hyperglycemia Monitor sodium level daily   # Diabetes mellitus, uncontrolled blood sugar Continue Semglee and NovoLog sliding scale Monitor CBG, continue diabetic diet - Insulins adjusted.      Subjective: Patient still complains of left shoulder pain. Sleeping a lot, likely due to pain meds. Scheduled for surgery in AM.   Physical Exam: Vitals:   03/26/23 0005 03/26/23 0456 03/26/23 0740 03/26/23 1525  BP: (!) 124/92 (!) 182/64 (!) 175/66 (!) 144/55  Pulse: 95 92 87 80  Resp: 18 18 18 18   Temp: 98.7 F (37.1 C) 98.9 F (37.2 C) 98 F (36.7 C) 98 F (36.7 C)  TempSrc: Oral Oral    SpO2: 94% 92% 90% 92%  Weight:      Height:       General: Alert, oriented X3  Eyes: Pupils equal, reactive  Oral cavity: moist mucous membranes  Head: Atraumatic, normocephalic  Neck: supple  Chest: clear to auscultation. No crackles, no wheezes  CVS: S1,S2 RRR. No murmurs  Abd: No distention, soft, non-tender. No masses palpable  Extr: Left shoulder bruising and swelling MSK: No joint deformities or swelling  Neurological: Grossly intact.     Data Reviewed:      Latest Ref Rng & Units 03/26/2023    7:19 AM 03/25/2023    6:48 AM 03/24/2023    8:35 AM  CBC  WBC 4.0 - 10.5 K/uL 11.5  11.7  11.1   Hemoglobin 12.0 - 15.0 g/dL 7.9  7.8  9.9   Hematocrit 36.0 - 46.0 % 22.6  22.4  27.9   Platelets 150 - 400 K/uL 171  147  178       Latest Ref Rng & Units 03/26/2023    7:19 AM 03/25/2023    6:48 AM 03/24/2023    8:35 AM  BMP  Glucose 70 - 99 mg/dL 846  962  952   BUN 8 - 23 mg/dL 31  45  29   Creatinine 0.44 - 1.00 mg/dL 8.41  3.24  4.01   Sodium 135 - 145 mmol/L 132  133  131   Potassium 3.5 - 5.1 mmol/L 3.7  3.9  3.7   Chloride 98 - 111 mmol/L 98  101  103   CO2 22 - 32 mmol/L 24  23  22    Calcium 8.9 - 10.3 mg/dL 8.8  8.5  8.2      Family Communication: spoke with daughter at  bedside  Disposition: Status is: Inpatient Remains inpatient appropriate because: She is pending shoulder surgery in AM.   Planned Discharge Destination: Skilled nursing facility DVT PPX: SCDs     Time spent: 35 minutes  Author: Marcine Matar, MD 03/26/2023 5:00 PM  For on call review [email or web address].

## 2023-03-27 ENCOUNTER — Other Ambulatory Visit (HOSPITAL_COMMUNITY): Payer: Medicare Other

## 2023-03-27 ENCOUNTER — Inpatient Hospital Stay (HOSPITAL_COMMUNITY): Payer: Medicare Other

## 2023-03-27 ENCOUNTER — Encounter (HOSPITAL_COMMUNITY): Payer: Self-pay | Admitting: Internal Medicine

## 2023-03-27 ENCOUNTER — Other Ambulatory Visit: Payer: Self-pay

## 2023-03-27 ENCOUNTER — Encounter (HOSPITAL_COMMUNITY)
Admission: EM | Disposition: A | Discharge status: Skilled Nursing Facility | Payer: Self-pay | Source: Home / Self Care | Attending: Internal Medicine

## 2023-03-27 DIAGNOSIS — M19012 Primary osteoarthritis, left shoulder: Secondary | ICD-10-CM

## 2023-03-27 DIAGNOSIS — I2510 Atherosclerotic heart disease of native coronary artery without angina pectoris: Secondary | ICD-10-CM | POA: Diagnosis not present

## 2023-03-27 DIAGNOSIS — S42232A 3-part fracture of surgical neck of left humerus, initial encounter for closed fracture: Secondary | ICD-10-CM

## 2023-03-27 DIAGNOSIS — Z87891 Personal history of nicotine dependence: Secondary | ICD-10-CM | POA: Diagnosis not present

## 2023-03-27 DIAGNOSIS — R011 Cardiac murmur, unspecified: Secondary | ICD-10-CM | POA: Diagnosis not present

## 2023-03-27 HISTORY — PX: REVERSE SHOULDER ARTHROPLASTY: SHX5054

## 2023-03-27 LAB — CBC
HCT: 23.1 % — ABNORMAL LOW (ref 36.0–46.0)
Hemoglobin: 8.1 g/dL — ABNORMAL LOW (ref 12.0–15.0)
MCH: 33.5 pg (ref 26.0–34.0)
MCHC: 35.1 g/dL (ref 30.0–36.0)
MCV: 95.5 fL (ref 80.0–100.0)
Platelets: 216 10*3/uL (ref 150–400)
RBC: 2.42 MIL/uL — ABNORMAL LOW (ref 3.87–5.11)
RDW: 12.1 % (ref 11.5–15.5)
WBC: 11 10*3/uL — ABNORMAL HIGH (ref 4.0–10.5)
nRBC: 0 % (ref 0.0–0.2)

## 2023-03-27 LAB — BASIC METABOLIC PANEL
Anion gap: 9 (ref 5–15)
BUN: 32 mg/dL — ABNORMAL HIGH (ref 8–23)
CO2: 22 mmol/L (ref 22–32)
Calcium: 8.6 mg/dL — ABNORMAL LOW (ref 8.9–10.3)
Chloride: 105 mmol/L (ref 98–111)
Creatinine, Ser: 1.2 mg/dL — ABNORMAL HIGH (ref 0.44–1.00)
GFR, Estimated: 42 mL/min — ABNORMAL LOW (ref >60)
Glucose, Bld: 227 mg/dL — ABNORMAL HIGH (ref 70–99)
Potassium: 3.6 mmol/L (ref 3.5–5.1)
Sodium: 136 mmol/L (ref 135–145)

## 2023-03-27 LAB — ECHOCARDIOGRAM COMPLETE
AR max vel: 2.38 cm2
AV Area VTI: 2.28 cm2
AV Area mean vel: 2.26 cm2
AV Mean grad: 8 mm[Hg]
AV Peak grad: 12.2 mm[Hg]
Ao pk vel: 1.75 m/s
Area-P 1/2: 2.28 cm2
Height: 66 in
S' Lateral: 2.8 cm
Weight: 2416 [oz_av]

## 2023-03-27 LAB — GLUCOSE, CAPILLARY
Glucose-Capillary: 201 mg/dL — ABNORMAL HIGH (ref 70–99)
Glucose-Capillary: 166 mg/dL — ABNORMAL HIGH (ref 70–99)
Glucose-Capillary: 126 mg/dL — ABNORMAL HIGH (ref 70–99)
Glucose-Capillary: 174 mg/dL — ABNORMAL HIGH (ref 70–99)
Glucose-Capillary: 221 mg/dL — ABNORMAL HIGH (ref 70–99)

## 2023-03-27 LAB — SURGICAL PCR SCREEN
MRSA, PCR: NEGATIVE
Staphylococcus aureus: NEGATIVE

## 2023-03-27 LAB — PREPARE RBC (CROSSMATCH)

## 2023-03-27 SURGERY — ARTHROPLASTY, SHOULDER, TOTAL, REVERSE
Anesthesia: General | Site: Shoulder | Laterality: Left

## 2023-03-27 MED ORDER — DEXAMETHASONE SODIUM PHOSPHATE 10 MG/ML IJ SOLN
INTRAMUSCULAR | Status: DC | PRN
  Administered 2023-03-27: 10 mg via INTRAVENOUS

## 2023-03-27 MED ORDER — VANCOMYCIN HCL 1000 MG IV SOLR
INTRAVENOUS | Status: AC
  Filled 2023-03-27: qty 20

## 2023-03-27 MED ORDER — DEXAMETHASONE SODIUM PHOSPHATE 10 MG/ML IJ SOLN
INTRAMUSCULAR | Status: AC
  Filled 2023-03-27: qty 1

## 2023-03-27 MED ORDER — BUPIVACAINE LIPOSOME 1.3 % IJ SUSP
INTRAMUSCULAR | Status: DC | PRN
  Administered 2023-03-27: 10 mL via PERINEURAL

## 2023-03-27 MED ORDER — CHLORHEXIDINE GLUCONATE 0.12 % MT SOLN
OROMUCOSAL | Status: AC
  Filled 2023-03-27: qty 15

## 2023-03-27 MED ORDER — POTASSIUM CHLORIDE 10 MEQ/100ML IV SOLN: 10.0000 meq | INTRAVENOUS | Status: AC

## 2023-03-27 MED ORDER — LIDOCAINE 2% (20 MG/ML) 5 ML SYRINGE
INTRAMUSCULAR | Status: AC
  Filled 2023-03-27: qty 5

## 2023-03-27 MED ORDER — ROCURONIUM BROMIDE 10 MG/ML (PF) SYRINGE
PREFILLED_SYRINGE | INTRAVENOUS | Status: AC
  Filled 2023-03-27: qty 10

## 2023-03-27 MED ORDER — PHENYLEPHRINE HCL-NACL 20-0.9 MG/250ML-% IV SOLN
INTRAVENOUS | Status: DC | PRN
  Administered 2023-03-27: 30 ug/min via INTRAVENOUS

## 2023-03-27 MED ORDER — GLYCOPYRROLATE 0.2 MG/ML IJ SOLN
INTRAMUSCULAR | Status: DC | PRN
  Administered 2023-03-27: .1 mg via INTRAVENOUS

## 2023-03-27 MED ORDER — PROPOFOL 500 MG/50ML IV EMUL
INTRAVENOUS | Status: DC | PRN
  Administered 2023-03-27: 80 ug/kg/min via INTRAVENOUS

## 2023-03-27 MED ORDER — CEFAZOLIN SODIUM-DEXTROSE 2-3 GM-%(50ML) IV SOLR
INTRAVENOUS | Status: DC | PRN
Start: 2023-03-27 — End: 2023-03-27
  Administered 2023-03-27: 2 g via INTRAVENOUS

## 2023-03-27 MED ORDER — PROPOFOL 10 MG/ML IV BOLUS
INTRAVENOUS | Status: AC
  Filled 2023-03-27: qty 20

## 2023-03-27 MED ORDER — ONDANSETRON HCL 4 MG/2ML IJ SOLN
INTRAMUSCULAR | Status: DC | PRN
  Administered 2023-03-27: 4 mg via INTRAVENOUS

## 2023-03-27 MED ORDER — VANCOMYCIN HCL 1 G IV SOLR
INTRAVENOUS | Status: DC | PRN
  Administered 2023-03-27: 1000 mg

## 2023-03-27 MED ORDER — SODIUM CHLORIDE 0.9 % IV SOLN: INTRAVENOUS | Status: DC | PRN

## 2023-03-27 MED ORDER — ONDANSETRON HCL 4 MG/2ML IJ SOLN
INTRAMUSCULAR | Status: AC
  Filled 2023-03-27: qty 2

## 2023-03-27 MED ORDER — ROCURONIUM BROMIDE 10 MG/ML (PF) SYRINGE
PREFILLED_SYRINGE | INTRAVENOUS | Status: DC | PRN
  Administered 2023-03-27: 50 mg via INTRAVENOUS

## 2023-03-27 MED ORDER — TRANEXAMIC ACID-NACL 1000-0.7 MG/100ML-% IV SOLN
INTRAVENOUS | Status: DC | PRN
  Administered 2023-03-27: 1000 mg via INTRAVENOUS

## 2023-03-27 MED ORDER — LIDOCAINE 2% (20 MG/ML) 5 ML SYRINGE
INTRAMUSCULAR | Status: DC | PRN
  Administered 2023-03-27: 40 mg via INTRAVENOUS

## 2023-03-27 MED ORDER — BUPIVACAINE-EPINEPHRINE (PF) 0.5% -1:200000 IJ SOLN
INTRAMUSCULAR | Status: DC | PRN
  Administered 2023-03-27: 15 mL via PERINEURAL

## 2023-03-27 MED ORDER — ALBUMIN HUMAN 5 % IV SOLN: INTRAVENOUS | Status: DC | PRN

## 2023-03-27 MED ORDER — SUGAMMADEX SODIUM 200 MG/2ML IV SOLN
INTRAVENOUS | Status: DC | PRN
  Administered 2023-03-27: 200 mg via INTRAVENOUS

## 2023-03-27 MED ORDER — BUPIVACAINE HCL (PF) 0.25 % IJ SOLN
INTRAMUSCULAR | Status: AC
  Filled 2023-03-27: qty 30

## 2023-03-27 MED ORDER — PROPOFOL 10 MG/ML IV BOLUS
INTRAVENOUS | Status: DC | PRN
  Administered 2023-03-27: 80 mg via INTRAVENOUS

## 2023-03-27 MED ORDER — FENTANYL CITRATE (PF) 100 MCG/2ML IJ SOLN
INTRAMUSCULAR | Status: AC
  Administered 2023-03-27: 50 ug
  Filled 2023-03-27: qty 2

## 2023-03-27 MED ORDER — CHLORHEXIDINE GLUCONATE 0.12 % MT SOLN
15.0000 mL | Freq: Once | OROMUCOSAL | Status: AC
  Administered 2023-03-27: 15 mL via OROMUCOSAL
  Filled 2023-03-27: qty 15

## 2023-03-27 MED ORDER — PHENYLEPHRINE 80 MCG/ML (10ML) SYRINGE FOR IV PUSH (FOR BLOOD PRESSURE SUPPORT)
PREFILLED_SYRINGE | INTRAVENOUS | Status: AC
  Filled 2023-03-27: qty 10

## 2023-03-27 MED ORDER — ORAL CARE MOUTH RINSE: 15.0000 mL | Freq: Once | OROMUCOSAL | Status: AC

## 2023-03-27 MED ORDER — LACTATED RINGERS IV SOLN: INTRAVENOUS | Status: DC

## 2023-03-27 MED ORDER — TRANEXAMIC ACID-NACL 1000-0.7 MG/100ML-% IV SOLN
INTRAVENOUS | Status: AC
  Filled 2023-03-27: qty 100

## 2023-03-27 MED ORDER — FENTANYL CITRATE (PF) 100 MCG/2ML IJ SOLN: 50.0000 ug | Freq: Once | INTRAMUSCULAR | Status: DC

## 2023-03-27 MED ORDER — 0.9 % SODIUM CHLORIDE (POUR BTL) OPTIME
TOPICAL | Status: DC | PRN
  Administered 2023-03-27 (×2): 1000 mL

## 2023-03-27 MED ORDER — PHENYLEPHRINE 80 MCG/ML (10ML) SYRINGE FOR IV PUSH (FOR BLOOD PRESSURE SUPPORT)
PREFILLED_SYRINGE | INTRAVENOUS | Status: DC | PRN
  Administered 2023-03-27: 160 ug via INTRAVENOUS

## 2023-03-27 SURGICAL SUPPLY — 100 items
BAG COUNTER SPONGE SURGICOUNT (BAG) ×2 IMPLANT
BASEPLATE GLENOSPHERE 25 (Plate) ×1 IMPLANT
BIT DRILL 5/64X5 DISP (BIT) ×1 IMPLANT
BIT DRILL TWIST 2.7 (BIT) ×1 IMPLANT
BLADE CLIPPER SURG (BLADE) IMPLANT
BLADE SAG 18X100X1.27 (BLADE) ×2 IMPLANT
BNDG ELASTIC 3INX 5YD STR LF (GAUZE/BANDAGES/DRESSINGS) ×4 IMPLANT
BNDG ELASTIC 4INX 5YD STR LF (GAUZE/BANDAGES/DRESSINGS) ×1 IMPLANT
BNDG ELASTIC 4X5.8 VLCR STR LF (GAUZE/BANDAGES/DRESSINGS) ×1 IMPLANT
BNDG ESMARK 4X9 LF (GAUZE/BANDAGES/DRESSINGS) ×1 IMPLANT
BNDG GAUZE DERMACEA FLUFF 4 (GAUZE/BANDAGES/DRESSINGS) ×3 IMPLANT
CORD BIPOLAR FORCEPS 12FT (ELECTRODE) ×2 IMPLANT
COVER SURGICAL LIGHT HANDLE (MISCELLANEOUS) ×2 IMPLANT
CUFF TOURN SGL QUICK 18X4 (TOURNIQUET CUFF) ×1 IMPLANT
CUFF TRNQT CYL 24X4X16.5-23 (TOURNIQUET CUFF) IMPLANT
DRAIN TLS ROUND 10FR (DRAIN) IMPLANT
DRAPE IMP U-DRAPE 54X76 (DRAPES) ×4 IMPLANT
DRAPE INCISE IOBAN 66X45 STRL (DRAPES) ×2 IMPLANT
DRAPE OEC MINIVIEW 54X84 (DRAPES) IMPLANT
DRAPE SURG 17X23 STRL (DRAPES) ×2 IMPLANT
DRAPE SURG ORHT 6 SPLT 77X108 (DRAPES) ×4 IMPLANT
DRAPE U-SHAPE 47X51 STRL (DRAPES) ×2 IMPLANT
DRESSING AQUACEL AG SP 3.5X6 (GAUZE/BANDAGES/DRESSINGS) ×1 IMPLANT
DRSG ADAPTIC 3X8 NADH LF (GAUZE/BANDAGES/DRESSINGS) ×1 IMPLANT
DRSG AQUACEL AG ADV 3.5X10 (GAUZE/BANDAGES/DRESSINGS) ×2 IMPLANT
DRSG AQUACEL AG SP 3.5X6 (GAUZE/BANDAGES/DRESSINGS) IMPLANT
DURAPREP 26ML APPLICATOR (WOUND CARE) ×2 IMPLANT
ELECT BLADE 4.0 EZ CLEAN MEGAD (MISCELLANEOUS) ×2 IMPLANT
ELECT REM PT RETURN 9FT ADLT (ELECTROSURGICAL) ×2 IMPLANT
ELECTRODE BLDE 4.0 EZ CLN MEGD (MISCELLANEOUS) ×2 IMPLANT
ELECTRODE REM PT RTRN 9FT ADLT (ELECTROSURGICAL) ×2 IMPLANT
FACESHIELD WRAPAROUND (MASK) IMPLANT
FACESHIELD WRAPAROUND OR TEAM (MASK) ×1 IMPLANT
GAUZE SPONGE 4X4 12PLY STRL (GAUZE/BANDAGES/DRESSINGS) ×1 IMPLANT
GAUZE XEROFORM 5X9 LF (GAUZE/BANDAGES/DRESSINGS) ×1 IMPLANT
GLENOID SPHERE 36MM CVD +3 (Orthopedic Implant) ×1 IMPLANT
GLOVE BIO SURGEON STRL SZ7 (GLOVE) ×2 IMPLANT
GLOVE BIO SURGEON STRL SZ7.5 (GLOVE) ×4 IMPLANT
GLOVE BIOGEL PI IND STRL 7.0 (GLOVE) ×2 IMPLANT
GLOVE BIOGEL PI IND STRL 8 (GLOVE) ×4 IMPLANT
GOWN STRL REUS W/ TWL LRG LVL3 (GOWN DISPOSABLE) ×2 IMPLANT
GOWN STRL REUS W/ TWL XL LVL3 (GOWN DISPOSABLE) ×4 IMPLANT
KIT BASIN OR (CUSTOM PROCEDURE TRAY) ×2 IMPLANT
KIT TURNOVER KIT B (KITS) ×2 IMPLANT
MANIFOLD NEPTUNE II (INSTRUMENTS) ×2 IMPLANT
NDL 1/2 CIR MAYO (NEEDLE) ×1 IMPLANT
NDL 22X1.5 STRL (OR ONLY) (MISCELLANEOUS) IMPLANT
NDL HYPO 25GX1X1/2 BEV (NEEDLE) ×1 IMPLANT
NDL SUT .5 MAYO 1.404X.05X (NEEDLE) ×1 IMPLANT
NEEDLE 1/2 CIR MAYO (NEEDLE) ×2 IMPLANT
NEEDLE 22X1.5 STRL (OR ONLY) (MISCELLANEOUS) IMPLANT
NEEDLE HYPO 25GX1X1/2 BEV (NEEDLE) ×2 IMPLANT
NS IRRIG 1000ML POUR BTL (IV SOLUTION) ×2 IMPLANT
PACK ORTHO EXTREMITY (CUSTOM PROCEDURE TRAY) ×2 IMPLANT
PACK SHOULDER (CUSTOM PROCEDURE TRAY) ×2 IMPLANT
PAD ARMBOARD 7.5X6 YLW CONV (MISCELLANEOUS) ×4 IMPLANT
PAD CAST 3X4 CTTN HI CHSV (CAST SUPPLIES) ×1 IMPLANT
PAD CAST 4YDX4 CTTN HI CHSV (CAST SUPPLIES) ×4 IMPLANT
PIN HUMERAL STMN 3.2MMX9IN (INSTRUMENTS) ×1 IMPLANT
RESTRAINT HEAD UNIVERSAL NS (MISCELLANEOUS) ×2 IMPLANT
SCREW CENTRAL 6.5X20MM (Screw) ×1 IMPLANT
SCREW LOCKING 4.75MMX15MM (Screw) ×2 IMPLANT
SCREW LOCKING NS 4.75MMX20MM (Screw) ×2 IMPLANT
SLING ARM IMMOBILIZER LRG (SOFTGOODS) IMPLANT
SLING ARM IMMOBILIZER MED (SOFTGOODS) ×1 IMPLANT
SOL PREP POV-IOD 4OZ 10% (MISCELLANEOUS) ×2 IMPLANT
SPIKE FLUID TRANSFER (MISCELLANEOUS) IMPLANT
SPLINT FIBERGLASS 3X35 (CAST SUPPLIES) ×1 IMPLANT
SPONGE T-LAP 18X18 ~~LOC~~+RFID (SPONGE) IMPLANT
SPONGE T-LAP 4X18 ~~LOC~~+RFID (SPONGE) ×2 IMPLANT
STEM HUMERAL STRL 11MMX83MM (Stem) ×1 IMPLANT
STRIP CLOSURE SKIN 1/2X4 (GAUZE/BANDAGES/DRESSINGS) ×2 IMPLANT
SUCTION TUBE FRAZIER 10FR DISP (SUCTIONS) ×2 IMPLANT
SUT BROADBAND TAPE 2PK 1.5 (SUTURE) ×2 IMPLANT
SUT FIBERWIRE #2 38 T-5 BLUE (SUTURE) IMPLANT
SUT MAXBRAID (SUTURE) ×1 IMPLANT
SUT MNCRL AB 3-0 PS2 18 (SUTURE) ×2 IMPLANT
SUT MNCRL AB 4-0 PS2 18 (SUTURE) ×1 IMPLANT
SUT MON AB 2-0 CT1 36 (SUTURE) ×1 IMPLANT
SUT MON AB 2-0 CT2 27 (SUTURE) ×1 IMPLANT
SUT PROLENE 3 0 PS 2 (SUTURE) IMPLANT
SUT PROLENE 4 0 PS 2 18 (SUTURE) IMPLANT
SUT VIC AB 0 CT1 27XBRD ANBCTR (SUTURE) ×2 IMPLANT
SUT VIC AB 1 CT1 27XBRD ANBCTR (SUTURE) IMPLANT
SUT VIC AB 1 CT1 36 (SUTURE) ×2 IMPLANT
SUT VIC AB 2-0 CT1 TAPERPNT 27 (SUTURE) ×1 IMPLANT
SUT VIC AB 3-0 FS2 27 (SUTURE) IMPLANT
SUTURE FIBERWR #2 38 T-5 BLUE (SUTURE) ×1 IMPLANT
SYR CONTROL 10ML LL (SYRINGE) ×2 IMPLANT
SYSTEM CHEST DRAIN TLS 7FR (DRAIN) IMPLANT
TOWEL GREEN STERILE (TOWEL DISPOSABLE) ×2 IMPLANT
TOWEL GREEN STERILE FF (TOWEL DISPOSABLE) ×2 IMPLANT
TOWER CARTRIDGE SMART MIX (DISPOSABLE) IMPLANT
TRAY HUM MINI SHOULDER +3 40 (Joint) ×1 IMPLANT
TRAY HUM REV SHOULDER 36 +3 (Shoulder) ×1 IMPLANT
TUBE CONNECTING 12X1/4 (SUCTIONS) ×2 IMPLANT
TUBE EVACUATION TLS (MISCELLANEOUS) ×2 IMPLANT
UNDERPAD 30X36 HEAVY ABSORB (UNDERPADS AND DIAPERS) ×2 IMPLANT
WATER STERILE IRR 1000ML POUR (IV SOLUTION) ×2 IMPLANT
YANKAUER SUCT BULB TIP NO VENT (SUCTIONS) ×2 IMPLANT

## 2023-03-27 NOTE — Progress Notes (Signed)
 Progress Note   Patient: Allison Duffy YNW:[id number] DOB: 05/12/1926 DOA: 03/23/2023     4 DOS: the patient was seen and examined on 03/27/2023   Brief hospital course: 89 year old woman with PMH of paroxysmal atrial fibrillation on Eliquis, type 2 diabetes mellitus, hypertension, and coronary artery disease who is independent and lives alone, who presented to the ED after a fall at home. Patient found to have a left humerus and wrist fracture.  Seen by orthopedic surgery.  Surgery planned for 03/27/2023.  Assessment and Plan:  # Left humerus  and wrist fracture with intractable pain  -On Dilaudid, fentanyl. -Seen by orthopedics.  -For surgery today (03/27/2023).   - Monitoring closely given her advanced age and renal insufficiency. - Continue antiemetics as narcotics make her nauseated  Cardiac clearance Cardiology consulted due to concerns for perioperative complications given her cardiac history. Patient seen by cardiology.  Input appreciated. Per cardiology, patient has 6.6% risk of major cardiac event in the perioperative period. Deemed acceptable risk for surgery. -Proceed with surgery.  # AKI on CKD stage IIIb Baseline creatinine 1.36. peaked at 1.95.  Creatinine is trending down.  Likely prerenal AKI due to poor p.o. intake. - Encourage oral fluids.  - Continue to monitor renal function - Avoid nephrotoxins.   # Anemia Likely due to acute blood loss secondary to fracture. Hemoglobin dropped from 12.2 on admission and has now stabilized.  Anemia workup not consistent with iron, B12 or folate deficiency. -Will continue to hold Eliquis. -Will ensure hemoglobin remains stable prior to discharge.  # Fall. Seen by PT. -Patient will be discharging to [facility].    # History of paroxysmal atrial fibrillation on Eliquis  - Holding Eliquis for now given fall and extensive bruising.  Resume at discharge.   # CAD, Hypertension and HLD -Continue home meds  amlodipine 10 mg p.o. daily, clonidine 0.1 mg p.o. twice daily, hydralazine 37.5 mg p.o. 3 times daily, Imdur 20 mg p.o. 3 times daily, Ranexa 500 mg p.o. twice daily -Will DC crestor given renal impairment.    # Hypokalemia  - Replete as needed.  # Hypomagnesemia Replete as needed..     # Hyponatremia most likely due to hyperglycemia Monitor sodium level daily   # Diabetes mellitus, uncontrolled blood sugar Continue Semglee and NovoLog sliding scale Monitor CBG, continue diabetic diet - Insulins adjusted.      Subjective: Patient scheduled for surgery today.  Physical Exam: Vitals:   03/27/23 1549 03/27/23 1554 03/27/23 1559 03/27/23 1604  BP:  (!) 151/54 (!) 166/76 134/72  Pulse: 79 87 88 96  Resp: 18 17 (!) 9 15  Temp:      TempSrc:      SpO2: 96% 95% 93% 90%  Weight:      Height:       General: Alert, oriented X3  Eyes: Pupils equal, reactive  Oral cavity: moist mucous membranes  Head: Atraumatic, normocephalic  Neck: supple  Chest: clear to auscultation. No crackles, no wheezes  CVS: S1,S2 RRR. No murmurs  Abd: No distention, soft, non-tender. No masses palpable  Extr: Left shoulder bruising and swelling.  Left hand in sling. Neurological: Grossly intact.     Data Reviewed:      Latest Ref Rng & Units 03/27/2023    7:30 AM 03/26/2023    7:19 AM 03/25/2023    6:48 AM  CBC  WBC 4.0 - 10.5 K/uL 11.0  11.5  11.7   Hemoglobin 12.0 - 15.0  g/dL 8.1  7.9  7.8   Hematocrit 36.0 - 46.0 % 23.1  22.6  22.4   Platelets 150 - 400 K/uL 216  171  147       Latest Ref Rng & Units 03/27/2023    7:30 AM 03/26/2023    7:19 AM 03/25/2023    6:48 AM  BMP  Glucose 70 - 99 mg/dL 295  621  308   BUN 8 - 23 mg/dL 32  31  45   Creatinine 0.44 - 1.00 mg/dL 6.57  8.46  9.62   Sodium 135 - 145 mmol/L 136  132  133   Potassium 3.5 - 5.1 mmol/L 3.6  3.7  3.9   Chloride 98 - 111 mmol/L 105  98  101   CO2 22 - 32 mmol/L 22  24  23    Calcium 8.9 - 10.3 mg/dL 8.6  8.8  8.5       Family Communication: spoke with grandson at bedside  Disposition: Status is: Inpatient Remains inpatient appropriate because: Pending shoulder surgery.  Planned Discharge Destination: Skilled nursing facility DVT PPX: SCDs     Time spent: 35 minutes  Author: MDALA-GAUSI, Gwenette Greet, MD 03/27/2023 6:10 PM  For on call review [email or web address].

## 2023-03-27 NOTE — Op Note (Signed)
 Date: 03/27/2023   PRE-OPERATIVE DIAGNOSIS:   1.  Left proximal humerus fracture, 3 part 2.  Left shoulder glenohumeral arthritis 3.  Left distal radius fracture, closed.   POST-OPERATIVE DIAGNOSIS:  Same   PROCEDURE:   1.  Left REVERSE SHOULDER ARTHROPLASTY 2.  Left proximal humerus open reduction internal fixation of greater and lesser tuberosities.   3.  Closed treatment of left distal radius fracture without manipulation.  SURGEON:  Yolonda Kida, MD   ASSISTANT: Dion Saucier, PA-C  Blood products given: 1 unit of packed red blood cell  Assistant attestation:  PA Sharon Seller present for the entire procedure.   ANESTHESIA:   General with a block   ESTIMATED BLOOD LOSS: See anesthesia record   PREOPERATIVE INDICATIONS: Allison Duffy is a 89 yo Female who sustained a left proximal humerus fracture, and left distal radius fracture with impaction and shortening but no angulation, following a fall.  Due to the comminution and displaced nature of the proximal humerus fracture and head splitting components we discussed operative management.  Furthermore, she has advanced glenohumeral arthritis at baseline.  We discussed moving forward with reverse shoulder arthroplasty for his injury to allow earlier weight bearing on a walker and/or cane as needed and lower risk of AVN, non union or progression of shoulder arthritis following the fracture. Thus we elected to proceed with reverse shoulder arthroplasty for the plan.  The risks benefits and alternatives were discussed with the patient preoperatively including but not limited to the risks of infection, bleeding, nerve injury, cardiopulmonary complications, the need for revision surgery, dislocation, brachial plexus palsy, incomplete relief of pain, among others, and the patient was willing to proceed. The patient did provided informed consent.  After lengthy discussion with her and some of my hand colleagues we elected to proceed with closed  treatment of the distal radius.   OPERATIVE IMPLANTS: Biomet size 11 comprehensive mini humeral stem press fitted   with a 40mm +3 extended with a +3 polyethylene liner and a 36 mm +3 glenosphere with a 25 mm (mini) baseplate and 4, 4.75 locking screws and one central 6.5 mm nonlocking screw.   OPERATIVE FINDINGS:   Significant subdeltoid hematoma noted from fracture.  Furthermore, she was on Eliquis preoperatively and had significant ecchymosis and bruising throughout the upper extremity.  There was head splitting component to the fracture along the greater tuberosity that propagated posterior.  The lesser tuberosity was intact to the humeral head component.  Rotator cuff tendons intact x 4.  Advanced glenohumeral osteoarthritis changes with flattening of the long head of the biceps tendon, chondral flaps on the glenoid and full-thickness cartilage loss of the humeral head.  This was a three-part fracture ultimately, with poor bone quality.   OPERATIVE PROCEDURE: The patient was brought to the operating room and placed in the supine position. General anesthesia was administered. IV antibiotics were given. Time out was performed. The upper extremity was prepped and draped in usual sterile fashion. The patient was in a beachchair position. Deltopectoral approach was carried out. After dissection through skin and subcutaneous fat, the cephalic vein was identified with the deltopectoral interval.     The fracture was identified and working through the fracture in the biceps groove the lesser and greater tuberosities were freed up and tagged with #2 Fiber wire sutures.  The humeral head was fragmented and removed from the wound.     Next, the long head of the biceps tendon was released in situ.  I then performed  circumferential releases of the humerus.  I then moved to sizing the humerus.  There was moderate calcar bone loss and this was used to reference the height of the stem, as was the upper border of  the pectoralis major tendon.  The canal was reamed and found to fit best with a 11 mm comprehensive mini stem.   We next turned to the glenoid.  Deep retractors were placed, and I resected the labrum as well as the residual long head of biceps, and then placed a guidepin into the center position on the glenoid, with slight inferior declination. I then reamed over the guidepin, and this created a small metaphyseal cancellus blush inferiorly, removing just the cartilage to the subchondral bone superiorly. The base plate was selected and impacted place, and then I secured it centrally with a nonlocking screw, and I had excellent purchase both inferiorly and superiorly. I placed a short locking screws on anterior aspect,  And posterior aspect.   I then turned my attention to the glenosphere, and impacted this into place, placing slight inferior offset.    The glenoid sphere was completely seated, and had engagement of the [facility] taper. I then turned my attention back to the humerus.    The 11 mm  stem was seated to the appropriate height to allow approximate 5.6 cm from the top of the Pectoralis major tendon to the top of the glenosphere.  The stem was placed in 30 degrees of retroversion.     Once the stem was impacted into place we trialed poly liners.  Using the standard humeral metaglen,  The shoulder had excellent motion, and was stable.  The final poly was impacted and again showed good motion and stability.  Next, I irrigated the wounds copiously.    We then turned our attention to the open reduction internal fixation of the greater tuberosity and lesser tuberosity.  The greater tuberosity was brought back to the humeral stem suture holes and secured with bone graft from the humeral head as augment.  The lesser tuberosity was likewise brought back to the anterior aspect of the humeral stem with combination of sutures wrapped around the humeral stem as well as grasping sutures in the myotendinous  junction itself.  The axillary nerve was palpated at the end of implanting, and found to be in continuity and not under undo tension.   I then irrigated the shoulder copiously once more, placed 1 g of vancomycin powder, repaired the deltopectoral interval with Vicryl followed by subcutaneous monocryl and then subcuticular monocryl with Steri-Strips and sterile gauze for the skin.   Next, we turned our attention to the distal radius fracture.  We did not manipulate given this was just impacted and not angulated or rotated.  We cleaned and dried the arm and placed a short arm splint in neutral position at the wrist.  This was well-padded and wrapped with an Ace bandage.  She was placed in the sling.    The patient was awakened and returned back in stable and satisfactory condition. There no complications and he tolerated the procedure well.  All counts were correct.  The patient awakened from general anesthesia with no complications and transferred to PACU in stable condition.   Postoperative Plan: Allison Duffy will be nonweightbearing through the wrist for approximately 1 month.  However, can go ahead and do active range of motion through the elbow and fingers as tolerated as well as passive.  At the shoulder she can begin pendulums  and scapular retractions immediately.  Will begin some passive range of motion with formal therapy in 2 weeks.  Otherwise she can use the left arm for platform weightbearing immediately.  She will return back to the medicine service at this time.

## 2023-03-27 NOTE — Anesthesia Procedure Notes (Signed)
 Procedure Name: Intubation Date/Time: 03/27/2023 4:22 PM  Performed by: Kayleen Memos, CRNAPre-anesthesia Checklist: Patient identified, Emergency Drugs available, Suction available and Patient being monitored Patient Re-evaluated:Patient Re-evaluated prior to induction Oxygen Delivery Method: Circle System Utilized Preoxygenation: Pre-oxygenation with 100% oxygen Induction Type: IV induction Ventilation: Mask ventilation without difficulty Laryngoscope Size: Mac and 3 Grade View: Grade I Tube type: Oral Tube size: 7.0 mm Number of attempts: 1 Airway Equipment and Method: Stylet and Oral airway Placement Confirmation: ETT inserted through vocal cords under direct vision, positive ETCO2 and breath sounds checked- equal and bilateral Secured at: 21 cm Tube secured with: Tape Dental Injury: Teeth and Oropharynx as per pre-operative assessment

## 2023-03-27 NOTE — Brief Op Note (Signed)
 03/23/2023 - 03/27/2023  6:02 PM  PATIENT:  Allison Duffy  89 y.o. female  PRE-OPERATIVE DIAGNOSIS:  1.  Closed fracture of shoulder, left 2.  Closed left distal radius fracture  POST-OPERATIVE DIAGNOSIS:   Same  PROCEDURE:  Procedure(s): 1.  Left REVERSE TOTAL  SHOULDER ARTHROPLASTY WITH   2.  CLOSED TREATMENT OF DISTAL RADIUS (Left)  SURGEON:  Surgeons and Role:    * Yolonda Kida, MD - Primary  PHYSICIAN ASSISTANT: Dion Saucier, PA-C   ANESTHESIA:   regional and general  EBL:  100 cc  BLOOD ADMINISTERED:none  DRAINS: none   LOCAL MEDICATIONS USED:  NONE  SPECIMEN:  No Specimen  DISPOSITION OF SPECIMEN:  N/A  COUNTS:  YES  TOURNIQUET:  * No tourniquets in log *  DICTATION: .Note written in EPIC  PLAN OF CARE: Admit to inpatient   PATIENT DISPOSITION:  PACU - hemodynamically stable.   Delay start of Pharmacological VTE agent (>24hrs) due to surgical blood loss or risk of bleeding: not applicable

## 2023-03-27 NOTE — H&P (Signed)
 H&P update  The surgical history has been reviewed and remains accurate without interval change.  The patient was re-examined and patient's physiologic condition has not changed significantly in the last 30 days. The condition still exists that makes this procedure necessary. The treatment plan remains the same, without new options for care.  No new pharmacological allergies or types of therapy has been initiated that would change the plan or the appropriateness of the plan.  The patient and/or family understand the potential benefits and risks.  Allison Duffy and I discussed her injury as well as treatment options.  At this time she would like to proceed with our recommendation for left shoulder reverse arthroplasty as well as left distal radius open reduction internal fixation.  Will return her to the hospitalist service postoperatively for routine postoperative care with her occupational therapist and physical therapist.  She will then discharge to skilled nursing facility sometime next week.  The risks, benefits, and alternatives were discussed with the patient. There are risks associated with the surgery including, but not limited to, problems with anesthesia (death), infection, differences in leg length/angulation/rotation, fracture of bones, loosening or failure of implants, malunion, nonunion, hematoma (blood accumulation) which may require surgical drainage, blood clots, pulmonary embolism, nerve injury (foot drop), and blood vessel injury. The patient understands these risks and elects to proceed.   Tarez Bowns P. Aundria Rud, MD 03/27/2023 3:19 PM

## 2023-03-27 NOTE — Plan of Care (Signed)

## 2023-03-27 NOTE — Progress Notes (Signed)
 2D echo reviewed:  IMPRESSIONS    1. Left ventricular ejection fraction, by estimation, is 60 to 65%. The  left ventricle has normal function. The left ventricle has no regional  wall motion abnormalities. There is mild concentric left ventricular  hypertrophy. Left ventricular diastolic  parameters are consistent with Grade I diastolic dysfunction (impaired  relaxation).   2. Right ventricular systolic function is normal. The right ventricular  size is normal. Tricuspid regurgitation signal is inadequate for assessing  PA pressure.   3. Left atrial size was severely dilated.   4. The mitral valve is normal in structure. Trivial mitral valve  regurgitation. No evidence of mitral stenosis.   5. The aortic valve was not well visualized. Aortic valve regurgitation  is not visualized. Aortic valve sclerosis is present, with no evidence of  aortic valve stenosis. Aortic valve mean gradient measures 8.0 mmHg.   6. The inferior vena cava is normal in size with greater than 50%  respiratory variability, suggesting right atrial pressure of 3 mmHg.   2D echo looks good.  As per my consult note yesterday, she is at moderate risk with 6.6% risk of major cardiac event in the perioperative period.  Ok to proceed with surgery.   Will sign off.  Please call with any questions.

## 2023-03-27 NOTE — Anesthesia Procedure Notes (Signed)
 Anesthesia Regional Block: Interscalene brachial plexus block   Pre-Anesthetic Checklist: , timeout performed,  Correct Patient, Correct Site, Correct Laterality,  Correct Procedure, Correct Position, site marked,  Risks and benefits discussed,  Pre-op evaluation,  At surgeon's request and post-op pain management  Laterality: Left  Prep: Maximum Sterile Barrier Precautions used, chloraprep       Needles:  Injection technique: Single-shot  Needle Type: Echogenic Stimulator Needle     Needle Length: 4cm  Needle Gauge: 22     Additional Needles:   Procedures:,,,, ultrasound used (permanent image in chart),,    Narrative:  Start time: 03/27/2023 3:54 PM End time: 03/27/2023 3:57 PM Injection made incrementally with aspirations every 5 mL.  Performed by: Personally  Anesthesiologist: Kaylyn Layer, MD  Additional Notes: Risks, benefits, and alternative discussed. Patient gave consent for procedure. Patient prepped and draped in sterile fashion. Sedation administered, patient remains easily responsive to voice. Relevant anatomy identified with ultrasound guidance. Local anesthetic given in 5cc increments with no signs or symptoms of intravascular injection. No pain or paraesthesias with injection. Patient monitored throughout procedure with signs of LAST or immediate complications. Tolerated well. Ultrasound image placed in chart.  Amalia Greenhouse, MD

## 2023-03-27 NOTE — Anesthesia Postprocedure Evaluation (Signed)
 Anesthesia Post Note  Patient: Allison Duffy  Procedure(s) Performed: REVERSE TOTAL  SHOULDER ARTHROPLASTY WITH CLOSED FIXATION OF DISTAL RADIUS (Left: Shoulder)     Patient location during evaluation: PACU Anesthesia Type: General Level of consciousness: awake and alert Pain management: pain level controlled Vital Signs Assessment: post-procedure vital signs reviewed and stable Respiratory status: spontaneous breathing, nonlabored ventilation, respiratory function stable and patient connected to nasal cannula oxygen Cardiovascular status: blood pressure returned to baseline and stable Postop Assessment: no apparent nausea or vomiting Anesthetic complications: no  No notable events documented.  Last Vitals:  Vitals:   03/27/23 1845 03/27/23 2033  BP: (!) 170/59 (!) 167/64  Pulse: 79 89  Resp: 14 17  Temp: 36.7 C 36.7 C  SpO2: 93% 94%    Last Pain:  Vitals:   03/27/23 2244  TempSrc:   PainSc: 3                  Shelton Silvas

## 2023-03-27 NOTE — Anesthesia Preprocedure Evaluation (Addendum)
 Anesthesia Evaluation  Patient identified by MRN, date of birth, ID band Patient awake    Reviewed: Allergy & Precautions, NPO status , Patient's Chart, lab work & pertinent test results  History of Anesthesia Complications (+) PONV and history of anesthetic complications  Airway Mallampati: II  TM Distance: >3 FB Neck ROM: Full    Dental  (+) Edentulous Upper, Missing,    Pulmonary former smoker   Pulmonary exam normal        Cardiovascular hypertension, Pt. on medications + CAD and +CHF  Normal cardiovascular exam  TTE 03/27/23: EF 60-65%, mild LVH, grade I DD, severe LAE    Neuro/Psych negative neurological ROS     GI/Hepatic Neg liver ROS,GERD  Medicated,,  Endo/Other  diabetes, Type 2, Oral Hypoglycemic Agents    Renal/GU Cr 1.2  negative genitourinary   Musculoskeletal  (+) Arthritis ,  Left shoulder fracture   Abdominal   Peds  Hematology  (+) Blood dyscrasia (Hgb 8.1), anemia   Anesthesia Other Findings Day of surgery medications reviewed with patient.  Reproductive/Obstetrics                              Anesthesia Physical Anesthesia Plan  ASA: 3  Anesthesia Plan: General   Post-op Pain Management: Regional block* and Tylenol PO (pre-op)*   Induction: Intravenous  PONV Risk Score and Plan: 4 or greater and Ondansetron, Dexamethasone, Treatment may vary due to age or medical condition, Propofol infusion and TIVA  Airway Management Planned: Oral ETT  Additional Equipment: None  Intra-op Plan:   Post-operative Plan: Extubation in OR  Informed Consent: I have reviewed the patients History and Physical, chart, labs and discussed the procedure including the risks, benefits and alternatives for the proposed anesthesia with the patient or authorized representative who has indicated his/her understanding and acceptance.     Dental advisory given  Plan Discussed with:  CRNA  Anesthesia Plan Comments:          Anesthesia Quick Evaluation

## 2023-03-27 NOTE — Progress Notes (Signed)
 Echocardiogram 2D Echocardiogram has been performed.  Warren Lacy Chioke Noxon RDCS 03/27/2023, 8:15 AM

## 2023-03-27 NOTE — Transfer of Care (Signed)
 Immediate Anesthesia Transfer of Care Note  Patient: Allison Duffy  Procedure(s) Performed: REVERSE TOTAL  SHOULDER ARTHROPLASTY WITH CLOSED FIXATION OF DISTAL RADIUS (Left: Shoulder)  Patient Location: PACU  Anesthesia Type:General and Regional  Level of Consciousness: awake and drowsy  Airway & Oxygen Therapy: Patient Spontanous Breathing  Post-op Assessment: Report given to RN and Post -op Vital signs reviewed and stable  Post vital signs: Reviewed and stable  Last Vitals:  Vitals Value Taken Time  BP 121/78 03/27/23 1820  Temp    Pulse 79 03/27/23 1824  Resp 15 03/27/23 1824  SpO2 95 % 03/27/23 1824  Vitals shown include unfiled device data.  Last Pain:  Vitals:   03/27/23 1446  TempSrc:   PainSc: 2       Patients Stated Pain Goal: 0 (03/24/23 1342)  Complications: No notable events documented.

## 2023-03-27 NOTE — Discharge Instructions (Signed)
 Orthopedic surgery discharge instructions:  -Maintain postoperative bandage until follow-up appointment.  This is waterproof, and you may begin showering on postoperative day #3.  Do not submerge underwater.  Maintain that bandage until your follow-up appointment in 2 weeks.  -No lifting over 2 pounds with operateive arm.  You may move your fingers, elbow as tolerated with active as well as passive range of motion.  You can also bear weight through your left arm as not if necessary for a walker.  Otherwise maintain your splint on the left wrist and sling when sleeping and at all other times unless getting dressed or instructed by therapy.  -Apply ice liberally to the shoulder throughout the day.  For mild to moderate pain use Tylenol and Advil as needed around-the-clock.  For breakthrough pain use oxycodone as necessary.  -You will return to see Dr. Aundria Rud in the office in 2 weeks for routine postoperative check with x-rays.

## 2023-03-27 NOTE — Progress Notes (Signed)
 Pt off floor for surgery

## 2023-03-28 LAB — GLUCOSE, CAPILLARY
Glucose-Capillary: 303 mg/dL — ABNORMAL HIGH (ref 70–99)
Glucose-Capillary: 234 mg/dL — ABNORMAL HIGH (ref 70–99)
Glucose-Capillary: 135 mg/dL — ABNORMAL HIGH (ref 70–99)
Glucose-Capillary: 143 mg/dL — ABNORMAL HIGH (ref 70–99)

## 2023-03-28 LAB — CBC
HCT: 25.2 % — ABNORMAL LOW (ref 36.0–46.0)
Hemoglobin: 8.7 g/dL — ABNORMAL LOW (ref 12.0–15.0)
MCH: 32.8 pg (ref 26.0–34.0)
MCHC: 34.5 g/dL (ref 30.0–36.0)
MCV: 95.1 fL (ref 80.0–100.0)
Platelets: 234 10*3/uL (ref 150–400)
RBC: 2.65 MIL/uL — ABNORMAL LOW (ref 3.87–5.11)
RDW: 13.9 % (ref 11.5–15.5)
WBC: 8.4 10*3/uL (ref 4.0–10.5)
nRBC: 0 % (ref 0.0–0.2)

## 2023-03-28 LAB — BASIC METABOLIC PANEL
Anion gap: 8 (ref 5–15)
BUN: 39 mg/dL — ABNORMAL HIGH (ref 8–23)
CO2: 23 mmol/L (ref 22–32)
Calcium: 8.2 mg/dL — ABNORMAL LOW (ref 8.9–10.3)
Chloride: 104 mmol/L (ref 98–111)
Creatinine, Ser: 1.37 mg/dL — ABNORMAL HIGH (ref 0.44–1.00)
GFR, Estimated: 36 mL/min — ABNORMAL LOW (ref >60)
Glucose, Bld: 309 mg/dL — ABNORMAL HIGH (ref 70–99)
Potassium: 4.1 mmol/L (ref 3.5–5.1)
Sodium: 135 mmol/L (ref 135–145)

## 2023-03-28 MED ORDER — APIXABAN 2.5 MG PO TABS
2.5000 mg | ORAL_TABLET | Freq: Two times a day (BID) | ORAL | Status: DC
  Administered 2023-03-28 – 2023-03-30 (×5): 2.5 mg via ORAL
  Filled 2023-03-28 (×5): qty 1

## 2023-03-28 MED ORDER — DOCUSATE SODIUM 100 MG PO CAPS
100.0000 mg | ORAL_CAPSULE | Freq: Two times a day (BID) | ORAL | Status: DC
  Administered 2023-03-28 – 2023-03-30 (×5): 100 mg via ORAL
  Filled 2023-03-28 (×5): qty 1

## 2023-03-28 MED ORDER — PHENOL 1.4 % MT LIQD: 1.0000 | OROMUCOSAL | Status: DC | PRN

## 2023-03-28 MED ORDER — CEFAZOLIN SODIUM-DEXTROSE 2-4 GM/100ML-% IV SOLN
2.0000 g | Freq: Four times a day (QID) | INTRAVENOUS | Status: AC
  Administered 2023-03-28 (×2): 2 g via INTRAVENOUS
  Filled 2023-03-28 (×2): qty 100

## 2023-03-28 MED ORDER — MENTHOL 3 MG MT LOZG: 1.0000 | LOZENGE | OROMUCOSAL | Status: DC | PRN

## 2023-03-28 MED ORDER — TRANEXAMIC ACID-NACL 1000-0.7 MG/100ML-% IV SOLN
1000.0000 mg | Freq: Once | INTRAVENOUS | Status: AC
  Administered 2023-03-28: 1000 mg via INTRAVENOUS
  Filled 2023-03-28: qty 100

## 2023-03-28 NOTE — Progress Notes (Signed)
 Progress Note   Patient: Allison Duffy ZOX:[id number] DOB: 17-Apr-1926 DOA: 03/23/2023     5 DOS: the patient was seen and examined on 03/28/2023   Brief hospital course: 89 year old woman with PMH of paroxysmal atrial fibrillation on Eliquis, type 2 diabetes mellitus, hypertension, and coronary artery disease who is independent and lives alone, who presented to the ED after a fall at home. Patient found to have a left humerus and wrist fracture.  Seen by orthopedic surgery.  Patient underwent left reverse shoulder arthroplasty, left proximal humerus ORIF and closed treatment of left distal radius fracture without manipulation in the OR on 03/27/2023.  Assessment and Plan:  # Left humerus  and wrist fracture  Orthopedics consulted.  Input appreciated. Patient is now s/p left reverse shoulder arthroplasty, left proximal humerus ORIF and closed treatment of left distal radius fracture without manipulation in the OR on 03/27/2023. -On Dilaudid, fentanyl. - Monitoring closely given her advanced age and renal insufficiency. -Mobilize upper extremity as per orthopedic recommendations.  # AKI on CKD stage IIIb Baseline creatinine 1.36. peaked at 1.95.  Creatinine trended down with rehydration.  Likely prerenal AKI due to poor p.o. intake. - Encourage oral fluids.  - Continue to monitor renal function - Avoid nephrotoxins.   # Anemia Likely due to acute blood loss secondary to fracture. Hemoglobin dropped from 12.2 on admission and has now stabilized.  Anemia workup not consistent with iron, B12 or folate deficiency. -Resume home Eliquis -Will ensure hemoglobin remains stable prior to discharge.  # Fall. Seen by PT. -Patient will be discharging to [facility].    # History of paroxysmal atrial fibrillation on Eliquis -Resume home Eliquis.   # CAD, Hypertension and HLD -Continue home meds amlodipine 10 mg p.o. daily, clonidine 0.1 mg p.o. twice daily, hydralazine 37.5 mg  p.o. 3 times daily, Imdur 20 mg p.o. 3 times daily, Ranexa 500 mg p.o. twice daily -Will DC crestor given renal impairment.  Will not resume at discharge.   # Hypokalemia  - Replete as needed.  # Hypomagnesemia Replete as needed..     # Hyponatremia most likely due to hyperglycemia Monitor sodium level daily   # Diabetes mellitus, uncontrolled blood sugar Continue Semglee and NovoLog sliding scale Monitor CBG, continue diabetic diet - Insulins adjusted.      Subjective: Patient underwent surgery yesterday.  States her left upper extremity still feels numb from nerve block.  Hyperglycemic today, likely due to Decadron given in the OR.  Physical Exam: Vitals:   03/28/23 1051 03/28/23 1501 03/28/23 1503 03/28/23 1513  BP: (!) 139/52  (!) 126/99 (!) 126/99  Pulse: 86 67 68   Resp:   18   Temp:   98 F (36.7 C)   TempSrc:   Oral   SpO2:  94% 94%   Weight:      Height:       General: Alert, oriented X3  Eyes: Pupils equal, reactive  Oral cavity: moist mucous membranes  Head: Atraumatic, normocephalic  Neck: supple  Chest: clear to auscultation. No crackles, no wheezes  CVS: S1,S2 RRR. No murmurs  Abd: No distention, soft, non-tender. No masses palpable  Extr: Left shoulder bruising and swelling.  Left arm in sling Neurological: Grossly intact.     Data Reviewed:      Latest Ref Rng & Units 03/28/2023    7:20 AM 03/27/2023    7:30 AM 03/26/2023    7:19 AM  CBC  WBC 4.0 - 10.5  K/uL 8.4  11.0  11.5   Hemoglobin 12.0 - 15.0 g/dL 8.7  8.1  7.9   Hematocrit 36.0 - 46.0 % 25.2  23.1  22.6   Platelets 150 - 400 K/uL 234  216  171       Latest Ref Rng & Units 03/28/2023    7:20 AM 03/27/2023    7:30 AM 03/26/2023    7:19 AM  BMP  Glucose 70 - 99 mg/dL 657  846  962   BUN 8 - 23 mg/dL 39  32  31   Creatinine 0.44 - 1.00 mg/dL 9.52  8.41  3.24   Sodium 135 - 145 mmol/L 135  136  132   Potassium 3.5 - 5.1 mmol/L 4.1  3.6  3.7   Chloride 98 - 111 mmol/L 104  105  98   CO2  22 - 32 mmol/L 23  22  24    Calcium 8.9 - 10.3 mg/dL 8.2  8.6  8.8      Family Communication: spoke with patient and daughter at bedside  Disposition: Status is: Inpatient Remains inpatient appropriate because: Pending placement in SNF  Planned Discharge Destination: Skilled nursing facility DVT PPX: Systemic anticoagulation with apixaban     Time spent: 35 minutes  Author: Marcine Matar, MD 03/28/2023 3:31 PM  For on call review [email or web address].

## 2023-03-28 NOTE — Plan of Care (Signed)
  Problem: Metabolic: Goal: Ability to maintain appropriate glucose levels will improve Outcome: Progressing   Problem: Skin Integrity: Goal: Risk for impaired skin integrity will decrease Outcome: Progressing   Problem: Education: Goal: Knowledge of General Education information will improve Description: Including pain rating scale, medication(s)/side effects and non-pharmacologic comfort measures Outcome: Progressing   Problem: Clinical Measurements: Goal: Will remain free from infection Outcome: Progressing   Problem: Safety: Goal: Ability to remain free from injury will improve Outcome: Progressing   Problem: Skin Integrity: Goal: Risk for impaired skin integrity will decrease Outcome: Progressing

## 2023-03-28 NOTE — Progress Notes (Signed)
    Subjective: Patient seen in rounds for Dr. Aundria Rud.  Patient reports pain as mild to moderate.  Denies N/V/CP/SOB/Abd pain. She reports she has been trying to wiggle her fingers. She reports she is doing okay today.   Objective:   VITALS:   Vitals:   03/28/23 1032 03/28/23 1051 03/28/23 1501 03/28/23 1503  BP: 139/61 (!) 139/52  (!) 126/99  Pulse:  86 67 68  Resp:    18  Temp:    98 F (36.7 C)  TempSrc:    Oral  SpO2:   94% 94%  Weight:      Height:        NAD She can wiggle fingers on exam. Wrist splint C/D/I.  Aquacel dressing C/D/I.  Sling in place Ecchymosis noted throughout arm and fingers.  Capillary refill <2 seconds.  Sensation intact distally   Lab Results  Component Value Date   WBC 8.4 03/28/2023   HGB 8.7 (L) 03/28/2023   HCT 25.2 (L) 03/28/2023   MCV 95.1 03/28/2023   PLT 234 03/28/2023   BMET    Component Value Date/Time   NA 135 03/28/2023 0720   NA 140 08/27/2022 1041   K 4.1 03/28/2023 0720   CL 104 03/28/2023 0720   CO2 23 03/28/2023 0720   GLUCOSE 309 (H) 03/28/2023 0720   BUN 39 (H) 03/28/2023 0720   BUN 28 08/27/2022 1041   CREATININE 1.37 (H) 03/28/2023 0720   CALCIUM 8.2 (L) 03/28/2023 0720   EGFR 34 (L) 08/27/2022 1041   GFRNONAA 36 (L) 03/28/2023 0720     Assessment/Plan: 1 Day Post-Op   Principal Problem:   Intractable pain Active Problems:   Paroxysmal atrial fibrillation (HCC)  - Patient under care of the medical team, disposition per their recommendation.  - DVT ppx: Eliquis. - NWB through the wrist for approximately 1 month. Okay for active range of motion through the elbow and fingers as tolerated as well as passive.  - At the shoulder she can begin pendulums and scapular retractions immediately. Will begin some passive range of motion with formal therapy in 2 weeks. - She can use the left arm for platform weightbearing immediately.    Clois Dupes 03/28/2023, 3:11 PM   [facility]  [place] [address], [address], [place], [place] [address] Phone: [phone] [email or web address] Facebook  [employer]

## 2023-03-28 NOTE — Evaluation (Signed)
 Occupational Therapy Evaluation Patient Details Name: Allison Duffy MRN: [id number] DOB: [birth year] Today's Date: 03/28/2023   History of Present Illness   Pt is a 89 yo female presented on 03/23/23 s/p fall with resultant fx of right humerus and wrist--currently in splint/sling awaiting surgery. S/p L RTSA on 03/27/23. PMH - CAD, HTN, HLD, chronic diastolic and systolic heart failure, afib     Clinical Impressions Pt s/p L RSA and reports less pain in LUE but could still be under effects of nerve block although she reports feeling sensation in fingers. Pt with somewhat impaired cognition on re-eval and dizzy with EOB sitting, swaying R and needing RUE to support self in sitting. Educated pt and daughter on AAROM exercises that can be perform to uninvolved joints of LUE, pt needing max to total A for ADLs and Max A to pivot to recline (recommend +2 for safety). OT to continue to progress pt as able, DC recs remain appropriate for SNF.      If plan is discharge home, recommend the following:   A lot of help with walking and/or transfers;Two people to help with bathing/dressing/bathroom;Assistance with cooking/housework;Assistance with feeding;Help with stairs or ramp for entrance;Assist for transportation     Functional Status Assessment   Patient has had a recent decline in their functional status and demonstrates the ability to make significant improvements in function in a reasonable and predictable amount of time.     Equipment Recommendations   Wheelchair (measurements OT);Wheelchair cushion (measurements OT);BSC/3in1     Recommendations for Other Services         Precautions/Restrictions   Precautions Precautions: Fall Recall of Precautions/Restrictions: Impaired Required Braces or Orthoses: Sling;Splint/Cast Splint/Cast: LUE wrist/forearm and immobilizer sling Restrictions Weight Bearing Restrictions Per Provider Order: Yes LUE Weight Bearing Per Provider Order:  Weight bear through elbow only Other Position/Activity Restrictions: No PROM/AROM of L shoulder, can WB with platform walker, Okay for pendulums and scapular retraction per MD orders     Mobility Bed Mobility Overal bed mobility: Needs Assistance Bed Mobility: Rolling, Sidelying to Sit Rolling: Mod assist Sidelying to sit: Mod assist       General bed mobility comments: Max A for side to sit on R side.    Transfers Overall transfer level: Needs assistance Equipment used: 1 person hand held assist Transfers: Sit to/from Stand Sit to Stand: Mod assist Stand pivot transfers: Max assist         General transfer comment: with use of pads and Max A pt able to be pivoted to recliner, unable to gather standing balance on initial stand.      Balance Overall balance assessment: Needs assistance Sitting-balance support: Feet supported, Single extremity supported Sitting balance-Leahy Scale: Poor Sitting balance - Comments: right lateral lean Postural control: Right lateral lean Standing balance support: Single extremity supported Standing balance-Leahy Scale: Poor Standing balance comment: reliant on OT                           ADL either performed or assessed with clinical judgement   ADL   Eating/Feeding: Bed level;Moderate assistance                                           Vision Baseline Vision/History: 1 Wears glasses Additional Comments: impaired vision at a distance     Perception  Praxis         Pertinent Vitals/Pain Pain Assessment Pain Assessment: Faces Faces Pain Scale: Hurts little more Pain Location: L shoulder Pain Descriptors / Indicators: Sore, Aching Pain Intervention(s): Monitored during session, Repositioned     Extremity/Trunk Assessment Upper Extremity Assessment LUE Deficits / Details: pt in splint and immobilizer sling, can perform gentle AAROM using R hand and partial wiggling of fingers LUE:  Unable to fully assess due to immobilization LUE Coordination: decreased gross motor;decreased fine motor           Communication Communication Factors Affecting Communication: Hearing impaired   Cognition Arousal: Alert Behavior During Therapy: WFL for tasks assessed/performed Cognition: Cognition impaired       Memory impairment (select all impairments): Short-term memory     OT - Cognition Comments: Per daughter pt remains a bit off, follows commands. repeats stories about her grandson that she has already told the therapist                 Following commands: Intact       Cueing  General Comments   Cueing Techniques: Verbal cues      Exercises     Shoulder Instructions      Home Living Family/patient expects to be discharged to:: Skilled nursing facility                                        Prior Functioning/Environment                      OT Problem List: Decreased strength;Decreased range of motion;Impaired balance (sitting and/or standing);Impaired UE functional use;Pain   OT Treatment/Interventions: Self-care/ADL training;Therapeutic activities;Therapeutic exercise;DME and/or AE instruction;Balance training;Patient/family education      OT Goals(Current goals can be found in the care plan section)   Acute Rehab OT Goals Patient Stated Goal: to have surgery and go to [facility] for rehab OT Goal Formulation: With patient/family Time For Goal Achievement: 04/08/23 Potential to Achieve Goals: Good   OT Frequency:  Min 3X/week    Co-evaluation              AM-PAC OT "6 Clicks" Daily Activity     Outcome Measure Help from another person eating meals?: A Lot Help from another person taking care of personal grooming?: A Lot Help from another person toileting, which includes using toliet, bedpan, or urinal?: Total Help from another person bathing (including washing, rinsing, drying)?: Total Help from another  person to put on and taking off regular upper body clothing?: Total Help from another person to put on and taking off regular lower body clothing?: Total 6 Click Score: 8   End of Session Equipment Utilized During Treatment: Other (comment) (immobilizer sling) Nurse Communication: Mobility status (+2 assist with use of pads to help pivot)  Activity Tolerance: Treatment limited secondary to medical complications (Comment) (dizzy EOB, suspect medications) Patient left: in chair;with call bell/phone within reach  OT Visit Diagnosis: Unsteadiness on feet (R26.81);Other abnormalities of gait and mobility (R26.89);Muscle weakness (generalized) (M62.81);Pain Pain - Right/Left: Left Pain - part of body: Arm;Hand                Time: 4540-9811 OT Time Calculation (min): 33 min Charges:  OT General Charges $OT Visit: 1 Visit OT Evaluation $OT Re-eval: 1 Re-eval OT Treatments $Therapeutic Activity: 8-22 mins  03/28/2023  AB, OTR/L  Acute  Rehabilitation Services  Office: [phone]   Tristan Schroeder 03/28/2023, 1:32 PM

## 2023-03-29 LAB — BASIC METABOLIC PANEL
Anion gap: 18 — ABNORMAL HIGH (ref 5–15)
BUN: 39 mg/dL — ABNORMAL HIGH (ref 8–23)
CO2: 22 mmol/L (ref 22–32)
Calcium: 9.3 mg/dL (ref 8.9–10.3)
Chloride: 99 mmol/L (ref 98–111)
Creatinine, Ser: 1.37 mg/dL — ABNORMAL HIGH (ref 0.44–1.00)
GFR, Estimated: 36 mL/min — ABNORMAL LOW (ref >60)
Glucose, Bld: 188 mg/dL — ABNORMAL HIGH (ref 70–99)
Potassium: 3.7 mmol/L (ref 3.5–5.1)
Sodium: 139 mmol/L (ref 135–145)

## 2023-03-29 LAB — GLUCOSE, CAPILLARY
Glucose-Capillary: 172 mg/dL — ABNORMAL HIGH (ref 70–99)
Glucose-Capillary: 222 mg/dL — ABNORMAL HIGH (ref 70–99)
Glucose-Capillary: 189 mg/dL — ABNORMAL HIGH (ref 70–99)
Glucose-Capillary: 213 mg/dL — ABNORMAL HIGH (ref 70–99)

## 2023-03-29 LAB — CBC
HCT: 23.6 % — ABNORMAL LOW (ref 36.0–46.0)
Hemoglobin: 8.3 g/dL — ABNORMAL LOW (ref 12.0–15.0)
MCH: 33.6 pg (ref 26.0–34.0)
MCHC: 35.2 g/dL (ref 30.0–36.0)
MCV: 95.5 fL (ref 80.0–100.0)
Platelets: 243 10*3/uL (ref 150–400)
RBC: 2.47 MIL/uL — ABNORMAL LOW (ref 3.87–5.11)
RDW: 13.2 % (ref 11.5–15.5)
WBC: 9.6 10*3/uL (ref 4.0–10.5)
nRBC: 0 % (ref 0.0–0.2)

## 2023-03-29 MED ORDER — POTASSIUM CHLORIDE CRYS ER 20 MEQ PO TBCR
40.0000 meq | EXTENDED_RELEASE_TABLET | Freq: Once | ORAL | Status: AC
  Administered 2023-03-29: 40 meq via ORAL
  Filled 2023-03-29: qty 2

## 2023-03-29 NOTE — Progress Notes (Signed)
 Progress Note   Patient: Allison Duffy:[id number] DOB: 26-Jan-1927 DOA: 03/23/2023     6 DOS: the patient was seen and examined on 03/29/2023   Brief hospital course: 89 year old woman with PMH of paroxysmal atrial fibrillation on Eliquis, type 2 diabetes mellitus, hypertension, and coronary artery disease who is independent and lives alone, who presented to the ED after a fall at home. Patient found to have a left humerus and wrist fracture.  Seen by orthopedic surgery.  Patient underwent left reverse shoulder arthroplasty, left proximal humerus ORIF and closed treatment of left distal radius fracture without manipulation in the OR on 03/27/2023.  Assessment and Plan:  # Left humerus  and wrist fracture  Orthopedics consulted.  Input appreciated. Patient is now s/p left reverse shoulder arthroplasty, left proximal humerus ORIF and closed treatment of left distal radius fracture without manipulation in the OR on 03/27/2023. -On PRN PO and IV dilaudid. - Monitoring closely given her advanced age and renal insufficiency. -Mobilize upper extremity as per orthopedic recommendations.  # AKI on CKD stage IIIb Baseline creatinine 1.36. peaked at 1.95.  Creatinine trended down with rehydration.  Likely prerenal AKI due to poor p.o. intake. - Encourage oral fluids.  - Continue to monitor renal function - Avoid nephrotoxins.   # Anemia Likely due to acute blood loss secondary to fracture. Hemoglobin dropped from 12.2 on admission and has now stabilized.  Anemia workup not consistent with iron, B12 or folate deficiency. -Resumed home Eliquis  # Fall. Seen by PT. -Patient will be discharging to [facility]    # History of paroxysmal atrial fibrillation on Eliquis -Resume home Eliquis.   # CAD, Hypertension and HLD -Continue home meds amlodipine 10 mg p.o. daily, clonidine 0.1 mg p.o. twice daily, hydralazine 37.5 mg p.o. 3 times daily, Imdur 20 mg p.o. 3 times daily,  Ranexa 500 mg p.o. twice daily -Will DC crestor given renal impairment.  Will not resume at discharge.   # Hypokalemia  - Replete as needed.  # Hypomagnesemia Replete as needed..     # Hyponatremia most likely due to hyperglycemia Monitor sodium level daily   # Diabetes mellitus, uncontrolled blood sugar Continue Semglee and NovoLog sliding scale Monitor CBG, continue diabetic diet - Insulins adjusted.      Subjective: Patient says she is not having too mch pain. Her left hand feels a little numb.   Physical Exam: Vitals:   03/28/23 1700 03/28/23 2007 03/29/23 0333 03/29/23 0717  BP: (!) 153/57 (!) 145/51 (!) 140/57 (!) 135/57  Pulse: 72 75 83 79  Resp: 19 18 18 18   Temp:  98 F (36.7 C) 99.1 F (37.3 C) 98 F (36.7 C)  TempSrc:  Oral Oral   SpO2: 97% 92% 94% 94%  Weight:      Height:       General: Alert, oriented X3  Eyes: Pupils equal, reactive  Oral cavity: moist mucous membranes  Head: Atraumatic, normocephalic  Neck: supple  Chest: clear to auscultation. No crackles, no wheezes  CVS: S1,S2 RRR. No murmurs  Abd: No distention, soft, non-tender. No masses palpable  Extr: Left shoulder bruising and swelling.  Left arm in sling. Left hand in ACE bandage Neurological: Grossly intact.     Data Reviewed:      Latest Ref Rng & Units 03/29/2023    7:21 AM 03/28/2023    7:20 AM 03/27/2023    7:30 AM  CBC  WBC 4.0 - 10.5 K/uL 9.6  8.4  11.0   Hemoglobin 12.0 - 15.0 g/dL 8.3  8.7  8.1   Hematocrit 36.0 - 46.0 % 23.6  25.2  23.1   Platelets 150 - 400 K/uL 243  234  216       Latest Ref Rng & Units 03/29/2023    7:21 AM 03/28/2023    7:20 AM 03/27/2023    7:30 AM  BMP  Glucose 70 - 99 mg/dL 409  811  914   BUN 8 - 23 mg/dL 39  39  32   Creatinine 0.44 - 1.00 mg/dL 7.82  9.56  2.13   Sodium 135 - 145 mmol/L 139  135  136   Potassium 3.5 - 5.1 mmol/L 3.7  4.1  3.6   Chloride 98 - 111 mmol/L 99  104  105   CO2 22 - 32 mmol/L 22  23  22    Calcium 8.9 - 10.3 mg/dL  9.3  8.2  8.6      Family Communication: spoke with patient and daughter at bedside  Disposition: Status is: Inpatient Remains inpatient appropriate because: Pending placement in SNF  Planned Discharge Destination: Skilled nursing facility DVT PPX: Systemic anticoagulation with apixaban     Time spent: 35 minutes  Author: Marcine Matar, MD 03/29/2023 1:07 PM  For on call review [email or web address].

## 2023-03-29 NOTE — Plan of Care (Signed)

## 2023-03-29 NOTE — Plan of Care (Signed)
  Problem: Fluid Volume: Goal: Ability to maintain a balanced intake and output will improve Outcome: Progressing   Problem: Health Behavior/Discharge Planning: Goal: Ability to manage health-related needs will improve Outcome: Progressing   Problem: Nutritional: Goal: Maintenance of adequate nutrition will improve Outcome: Progressing

## 2023-03-30 ENCOUNTER — Encounter (HOSPITAL_COMMUNITY): Payer: Self-pay | Admitting: Orthopedic Surgery

## 2023-03-30 DIAGNOSIS — R079 Chest pain, unspecified: Secondary | ICD-10-CM | POA: Diagnosis not present

## 2023-03-30 DIAGNOSIS — R600 Localized edema: Secondary | ICD-10-CM | POA: Diagnosis not present

## 2023-03-30 DIAGNOSIS — Z7401 Bed confinement status: Secondary | ICD-10-CM | POA: Diagnosis not present

## 2023-03-30 DIAGNOSIS — L89622 Pressure ulcer of left heel, stage 2: Secondary | ICD-10-CM | POA: Diagnosis not present

## 2023-03-30 DIAGNOSIS — H04123 Dry eye syndrome of bilateral lacrimal glands: Secondary | ICD-10-CM | POA: Diagnosis not present

## 2023-03-30 DIAGNOSIS — I10 Essential (primary) hypertension: Secondary | ICD-10-CM | POA: Diagnosis not present

## 2023-03-30 DIAGNOSIS — R52 Pain, unspecified: Secondary | ICD-10-CM | POA: Diagnosis not present

## 2023-03-30 DIAGNOSIS — S62102D Fracture of unspecified carpal bone, left wrist, subsequent encounter for fracture with routine healing: Secondary | ICD-10-CM | POA: Diagnosis not present

## 2023-03-30 DIAGNOSIS — R351 Nocturia: Secondary | ICD-10-CM | POA: Diagnosis not present

## 2023-03-30 DIAGNOSIS — I5030 Unspecified diastolic (congestive) heart failure: Secondary | ICD-10-CM | POA: Diagnosis not present

## 2023-03-30 DIAGNOSIS — E559 Vitamin D deficiency, unspecified: Secondary | ICD-10-CM | POA: Diagnosis not present

## 2023-03-30 DIAGNOSIS — D649 Anemia, unspecified: Secondary | ICD-10-CM | POA: Diagnosis not present

## 2023-03-30 DIAGNOSIS — Z66 Do not resuscitate: Secondary | ICD-10-CM | POA: Diagnosis not present

## 2023-03-30 DIAGNOSIS — K219 Gastro-esophageal reflux disease without esophagitis: Secondary | ICD-10-CM | POA: Diagnosis not present

## 2023-03-30 DIAGNOSIS — I5033 Acute on chronic diastolic (congestive) heart failure: Secondary | ICD-10-CM | POA: Diagnosis not present

## 2023-03-30 DIAGNOSIS — I4891 Unspecified atrial fibrillation: Secondary | ICD-10-CM | POA: Diagnosis not present

## 2023-03-30 DIAGNOSIS — Z7901 Long term (current) use of anticoagulants: Secondary | ICD-10-CM | POA: Diagnosis not present

## 2023-03-30 DIAGNOSIS — M25512 Pain in left shoulder: Secondary | ICD-10-CM | POA: Diagnosis not present

## 2023-03-30 DIAGNOSIS — S52502A Unspecified fracture of the lower end of left radius, initial encounter for closed fracture: Secondary | ICD-10-CM | POA: Diagnosis not present

## 2023-03-30 DIAGNOSIS — E1165 Type 2 diabetes mellitus with hyperglycemia: Secondary | ICD-10-CM | POA: Diagnosis not present

## 2023-03-30 DIAGNOSIS — S4292XD Fracture of left shoulder girdle, part unspecified, subsequent encounter for fracture with routine healing: Secondary | ICD-10-CM | POA: Diagnosis not present

## 2023-03-30 DIAGNOSIS — E785 Hyperlipidemia, unspecified: Secondary | ICD-10-CM | POA: Diagnosis not present

## 2023-03-30 DIAGNOSIS — I480 Paroxysmal atrial fibrillation: Secondary | ICD-10-CM | POA: Diagnosis not present

## 2023-03-30 DIAGNOSIS — M25532 Pain in left wrist: Secondary | ICD-10-CM | POA: Diagnosis not present

## 2023-03-30 DIAGNOSIS — I340 Nonrheumatic mitral (valve) insufficiency: Secondary | ICD-10-CM | POA: Diagnosis not present

## 2023-03-30 DIAGNOSIS — R531 Weakness: Secondary | ICD-10-CM | POA: Diagnosis not present

## 2023-03-30 DIAGNOSIS — Z4889 Encounter for other specified surgical aftercare: Secondary | ICD-10-CM | POA: Diagnosis not present

## 2023-03-30 DIAGNOSIS — E118 Type 2 diabetes mellitus with unspecified complications: Secondary | ICD-10-CM | POA: Diagnosis not present

## 2023-03-30 DIAGNOSIS — Z4789 Encounter for other orthopedic aftercare: Secondary | ICD-10-CM | POA: Diagnosis not present

## 2023-03-30 DIAGNOSIS — I2510 Atherosclerotic heart disease of native coronary artery without angina pectoris: Secondary | ICD-10-CM | POA: Diagnosis not present

## 2023-03-30 DIAGNOSIS — W19XXXD Unspecified fall, subsequent encounter: Secondary | ICD-10-CM | POA: Diagnosis not present

## 2023-03-30 LAB — BASIC METABOLIC PANEL
Anion gap: 10 (ref 5–15)
BUN: 35 mg/dL — ABNORMAL HIGH (ref 8–23)
CO2: 22 mmol/L (ref 22–32)
Calcium: 8.5 mg/dL — ABNORMAL LOW (ref 8.9–10.3)
Chloride: 103 mmol/L (ref 98–111)
Creatinine, Ser: 1.23 mg/dL — ABNORMAL HIGH (ref 0.44–1.00)
GFR, Estimated: 41 mL/min — ABNORMAL LOW (ref >60)
Glucose, Bld: 191 mg/dL — ABNORMAL HIGH (ref 70–99)
Potassium: 4.2 mmol/L (ref 3.5–5.1)
Sodium: 135 mmol/L (ref 135–145)

## 2023-03-30 LAB — CBC
HCT: 23.9 % — ABNORMAL LOW (ref 36.0–46.0)
Hemoglobin: 8.2 g/dL — ABNORMAL LOW (ref 12.0–15.0)
MCH: 33.5 pg (ref 26.0–34.0)
MCHC: 34.3 g/dL (ref 30.0–36.0)
MCV: 97.6 fL (ref 80.0–100.0)
Platelets: 275 10*3/uL (ref 150–400)
RBC: 2.45 MIL/uL — ABNORMAL LOW (ref 3.87–5.11)
RDW: 12.9 % (ref 11.5–15.5)
WBC: 9.1 10*3/uL (ref 4.0–10.5)
nRBC: 0 % (ref 0.0–0.2)

## 2023-03-30 LAB — GLUCOSE, CAPILLARY
Glucose-Capillary: 202 mg/dL — ABNORMAL HIGH (ref 70–99)
Glucose-Capillary: 256 mg/dL — ABNORMAL HIGH (ref 70–99)

## 2023-03-30 MED ORDER — ACETAMINOPHEN 325 MG PO TABS: 650.0000 mg | ORAL_TABLET | Freq: Four times a day (QID) | ORAL | Status: DC | PRN

## 2023-03-30 MED ORDER — INSULIN GLARGINE 100 UNIT/ML ~~LOC~~ SOLN: 20.0000 [IU] | Freq: Every day | SUBCUTANEOUS | Status: DC

## 2023-03-30 MED ORDER — HYDROMORPHONE HCL 2 MG PO TABS: 1.0000 mg | ORAL_TABLET | Freq: Three times a day (TID) | ORAL | 0 refills | Status: DC | PRN

## 2023-03-30 MED ORDER — METFORMIN HCL 500 MG PO TABS: 500.0000 mg | ORAL_TABLET | Freq: Every day | ORAL | Status: DC

## 2023-03-30 NOTE — Progress Notes (Signed)
 Report called to [facility] facility.

## 2023-03-30 NOTE — Discharge Summary (Signed)
 Physician Discharge Summary   Patient: Allison Duffy MRN: [id number] DOB: September 14, 1926  Admit date:     03/23/2023  Discharge date: 03/30/23  Discharge Physician: MDALA-GAUSI, Gwenette Greet   PCP: Geoffry Paradise, MD   Recommendations at discharge:    Follow up with Orthopedic surgery.  Discharge Diagnoses: Principal Problem:   Intractable pain Active Problems:   Paroxysmal atrial fibrillation (HCC)  Resolved Problems:   * No resolved hospital problems. *  Hospital Course: 89 year old woman with PMH of paroxysmal atrial fibrillation on Eliquis, type 2 diabetes mellitus, hypertension, and coronary artery disease who is independent and lives alone, who presented to the ED after a fall at home. Patient found to have a left humerus and wrist fracture.  Seen by orthopedic surgery.  Patient underwent left reverse shoulder arthroplasty, left proximal humerus ORIF and closed treatment of left distal radius fracture without manipulation in the OR on 03/27/2023.  The rest of the hospital course is in problem-based format below:    # Left humerus  and wrist fracture  Orthopedics was consulted.   Patient is now s/p left reverse shoulder arthroplasty, left proximal humerus ORIF and closed treatment of left distal radius fracture without manipulation in the OR on 03/27/2023. Pain treated with PRN tylenol and hydromorphone.  Upper extremity mobilization recommended per Ortho instructions.    # AKI on CKD stage IIIb  Likely prerenal AKI due to poor p.o. intake. Baseline creatinine 1.36. peaked at 1.95.  Creatinine trended down with rehydration and was 1.23 on the day of discharge.    # Anemia Likely due to acute blood loss secondary to fracture. Hemoglobin dropped from 12.2 on admission and then stabilized.  Anemia workup not consistent with iron, B12 or folate deficiency. Home Eliquis was resumed.  Hgb was 8.2 on the day of discharge.    # Fall. Seen by PT, who recommended  SNF. Patient will be discharging to [facility].    # History of paroxysmal atrial fibrillation  Home Eliquis held for surgery, and then resumed.    # CAD, Hypertension and HLD Home meds amlodipine, clonidine, hydralazine, Imdur, Ranexa  were continued.  Olmesartan and Crestor discontinued.    # Hypokalemia # Hypomagnesemia Electrolytes were repleted as needed.    # Diabetes mellitus, uncontrolled blood sugar Patient on Januvia and metformin at home.  She was started on long acting insulin and sliding scale insulin while hospitalized.  She is being discharge on Januvia, metformin and lantus insulin.   Consultants: Orthopedic surgery Procedures performed:   Left reverse shoulder arthroplasty, left proximal humerus ORIF and closed treatment of left distal radius fracture without manipulation. Disposition: Skilled nursing facility Diet recommendation:  Discharge Diet Orders (From admission, onward)     Start     Ordered   03/30/23 0000  Diet Carb Modified        03/30/23 1023           Carb modified diet DISCHARGE MEDICATION: Allergies as of 03/30/2023       Reactions   Codeine Nausea And Vomiting   Hydrocodone Nausea And Vomiting   Nausea and vomiting   Prednisone    Other reaction(s): sick on her stomach   Sulfa Antibiotics Itching, Nausea Only, Other (See Comments)        Medication List     STOP taking these medications    olmesartan 40 MG tablet Commonly known as: BENICAR   rosuvastatin 20 MG tablet Commonly known as: CRESTOR  TAKE these medications    acetaminophen 325 MG tablet Commonly known as: TYLENOL Take 2 tablets (650 mg total) by mouth every 6 (six) hours as needed for mild pain (pain score 1-3) or moderate pain (pain score 4-6). What changed: how much to take   amLODipine 10 MG tablet Commonly known as: NORVASC Take 10 mg by mouth at bedtime.   apixaban 2.5 MG Tabs tablet Commonly known as: ELIQUIS Take 2.5 mg  by mouth 2 (two) times daily.   carboxymethylcellulose 1 % ophthalmic solution Place 1 drop into both eyes 2 (two) times daily.   cholecalciferol 25 MCG (1000 UNIT) tablet Commonly known as: VITAMIN D3 Take 1,000 Units by mouth daily with lunch.   cholestyramine 4 g packet Commonly known as: QUESTRAN Take 4 g by mouth daily as needed (IBS).   cloNIDine 0.1 MG tablet Commonly known as: CATAPRES TAKE 2 TABLETS BY MOUTH EVERY MORNING AND TAKE 1 TABLET BY MOUTH EVERY NIGHT AT BEDTIME   furosemide 20 MG tablet Commonly known as: LASIX Take 1 tablet (20 mg total) by mouth daily.   hydrALAZINE 25 MG tablet Commonly known as: APRESOLINE TAKE 1 1/2 TABLETS(37.5 MG) THREE TIMES DAILY What changed: additional instructions   HYDROmorphone 2 MG tablet Commonly known as: DILAUDID Take 0.5 tablets (1 mg total) by mouth every 8 (eight) hours as needed for severe pain (pain score 7-10).   insulin glargine 100 UNIT/ML injection Commonly known as: LANTUS Inject 0.2 mLs (20 Units total) into the skin daily. Start taking on: March 31, 2023   isosorbide dinitrate 20 MG tablet Commonly known as: ISORDIL TAKE 1 TABLET BY MOUTH THREE TIMES DAILY. TAKE WITH HYDRALAZINE 37.5MG (1 AND A 1/2 TABLETS)   metFORMIN 500 MG tablet Commonly known as: GLUCOPHAGE Take 1 tablet (500 mg total) by mouth daily with breakfast. What changed: when to take this   multivitamin with minerals Tabs tablet Take 1 tablet by mouth daily with supper.   nitroGLYCERIN 0.4 MG SL tablet Commonly known as: NITROSTAT PLACE 1 TABLET (0.4 MG TOTAL) UNDER THE TONGUE EVERY FIVE MINUTES X 3 DOSES AS NEEDED FOR CHEST PAIN.   omeprazole 40 MG capsule Commonly known as: PRILOSEC TAKE 1 CAPSULE BY MOUTH DAILY WITH SUPPER   potassium chloride 10 MEQ tablet Commonly known as: KLOR-CON TAKE 1 TABLET(10 MEQ) BY MOUTH DAILY-- MAY TAKE AN EXTRA WITH A WEIGHT INCREASE OF 2LBS OVERNIGHT OR 5 LBS TOTAL AS DIRECTED   Pro-biotic  Blend Caps Take 1 capsule by mouth at bedtime. Takes OTC probiotic   ranolazine 500 MG 12 hr tablet Commonly known as: RANEXA TAKE 1 TABLET(500 MG) BY MOUTH TWICE DAILY   sitaGLIPtin 100 MG tablet Commonly known as: JANUVIA Take 100 mg by mouth daily with lunch.               Discharge Care Instructions  (From admission, onward)           Start     Ordered   03/30/23 0000  Leave dressing on - Keep it clean, dry, and intact until clinic visit        03/30/23 1023            Contact information for follow-up providers     Yolonda Kida, MD Follow up in 2 week(s).   Specialty: Orthopedic Surgery Why: For wound re-check Contact information: [address] [address] [place] [address] [phone]  Contact information for after-discharge care     Destination     [facility], [place] Preferred SNF .   Service: Skilled Nursing Contact information: [address] [place] [address] [phone]                    Discharge Exam: Ceasar Mons Weights   03/23/23 1402  Weight: 68.5 kg   Physical Exam on Day of Discharge   General: Alert, cheerful, oriented X3  Oral cavity: moist mucous membranes  Neck: supple  Chest: clear to auscultation. No crackles, no wheezes  CVS: S1,S2 RRR. No murmurs  Abd: No distention, soft, non-tender. No masses palpable  Extr: Left upper extremity in sling, dressings in place   Condition at discharge: stable  The results of significant diagnostics from this hospitalization (including imaging, microbiology, ancillary and laboratory) are listed below for reference.   Imaging Studies: ECHOCARDIOGRAM COMPLETE Result Date: 03/27/2023    ECHOCARDIOGRAM REPORT   Patient Name:   TAMEYA KUZNIA Date of Exam: 03/27/2023 Medical Rec #:  [id number]        Height:       66.0 in Accession #:    [id number]       Weight:       151.0 lb Date of Birth:  [birth year]         BSA:          1.775 m Patient Age:    89 years         BP:           147/89 mmHg Patient Gender: F                HR:           93 bpm. Exam Location:  Inpatient Procedure: 2D Echo, Color Doppler and Cardiac Doppler (Both Spectral and Color            Flow Doppler were utilized during procedure). Indications:    R01.1 Murmur, Pre-op evaluation  History:        Patient has prior history of Echocardiogram examinations, most                 recent 07/24/2022. CHF, CAD, Arrythmias:Atrial Fibrillation; Risk                 Factors:Hypertension, Diabetes and Dyslipidemia.  Sonographer:    Irving Burton Senior RDCS Referring Phys: [phone] TRACI R TURNER  Sonographer Comments: Suboptimal parasternal window due to brace IMPRESSIONS  1. Left ventricular ejection fraction, by estimation, is 60 to 65%. The left ventricle has normal function. The left ventricle has no regional wall motion abnormalities. There is mild concentric left ventricular hypertrophy. Left ventricular diastolic parameters are consistent with Grade I diastolic dysfunction (impaired relaxation).  2. Right ventricular systolic function is normal. The right ventricular size is normal. Tricuspid regurgitation signal is inadequate for assessing PA pressure.  3. Left atrial size was severely dilated.  4. The mitral valve is normal in structure. Trivial mitral valve regurgitation. No evidence of mitral stenosis.  5. The aortic valve was not well visualized. Aortic valve regurgitation is not visualized. Aortic valve sclerosis is present, with no evidence of aortic valve stenosis. Aortic valve mean gradient measures 8.0 mmHg.  6. The inferior vena cava is normal in size with greater than 50% respiratory variability, suggesting right atrial pressure of 3 mmHg. FINDINGS  Left Ventricle: Left ventricular ejection fraction, by estimation, is 60 to 65%. The left ventricle  has normal function. The left ventricle has no regional wall motion abnormalities. The left ventricular  internal cavity size was normal in size. There is  mild concentric left ventricular hypertrophy. Left ventricular diastolic parameters are consistent with Grade I diastolic dysfunction (impaired relaxation). Right Ventricle: The right ventricular size is normal. No increase in right ventricular wall thickness. Right ventricular systolic function is normal. Tricuspid regurgitation signal is inadequate for assessing PA pressure. Left Atrium: Left atrial size was severely dilated. Right Atrium: Right atrial size was normal in size. Pericardium: Trivial pericardial effusion is present. Mitral Valve: The mitral valve is normal in structure. There is mild thickening of the mitral valve leaflet(s). Mild mitral annular calcification. Trivial mitral valve regurgitation. No evidence of mitral valve stenosis. Tricuspid Valve: The tricuspid valve is normal in structure. Tricuspid valve regurgitation is trivial. Aortic Valve: The aortic valve was not well visualized. Aortic valve regurgitation is not visualized. Aortic valve sclerosis is present, with no evidence of aortic valve stenosis. Aortic valve mean gradient measures 8.0 mmHg. Aortic valve peak gradient measures 12.2 mmHg. Aortic valve area, by VTI measures 2.28 cm. Pulmonic Valve: The pulmonic valve was grossly normal. Pulmonic valve regurgitation is not visualized. Aorta: The ascending aorta was not well visualized and the aortic root is normal in size and structure. Venous: The inferior vena cava is normal in size with greater than 50% respiratory variability, suggesting right atrial pressure of 3 mmHg. IAS/Shunts: No atrial level shunt detected by color flow Doppler.  LEFT VENTRICLE PLAX 2D LVIDd:         4.10 cm   Diastology LVIDs:         2.80 cm   LV e' medial:    6.96 cm/s LV PW:         1.20 cm   LV E/e' medial:  9.9 LV IVS:        1.20 cm   LV e' lateral:   9.57 cm/s LVOT diam:     2.00 cm   LV E/e' lateral: 7.2 LV SV:         73 LV SV Index:   41 LVOT Area:      3.14 cm  RIGHT VENTRICLE RV S prime:     19.70 cm/s TAPSE (M-mode): 2.3 cm LEFT ATRIUM              Index        RIGHT ATRIUM           Index LA diam:        4.50 cm  2.54 cm/m   RA Area:     10.30 cm LA Vol (A2C):   96.2 ml  54.21 ml/m  RA Volume:   22.40 ml  12.62 ml/m LA Vol (A4C):   102.0 ml 57.48 ml/m LA Biplane Vol: 100.0 ml 56.35 ml/m  AORTIC VALVE AV Area (Vmax):    2.38 cm AV Area (Vmean):   2.26 cm AV Area (VTI):     2.28 cm AV Vmax:           174.50 cm/s AV Vmean:          131.500 cm/s AV VTI:            0.319 m AV Peak Grad:      12.2 mmHg AV Mean Grad:      8.0 mmHg LVOT Vmax:         132.00 cm/s LVOT Vmean:        94.400 cm/s LVOT  VTI:          0.232 m LVOT/AV VTI ratio: 0.73  AORTA Ao Root diam: 2.90 cm MITRAL VALVE MV Area (PHT): 2.28 cm    SHUNTS MV Decel Time: 333 msec    Systemic VTI:  0.23 m MV E velocity: 68.80 cm/s  Systemic Diam: 2.00 cm MV A velocity: 73.90 cm/s MV E/A ratio:  0.93 Dalton McleanMD Electronically signed by Wilfred Lacy Signature Date/Time: 03/27/2023/8:17:11 AM    Final    CT Shoulder Left Wo Contrast Result Date: 03/23/2023 CLINICAL DATA:  Shoulder trauma with fracture seen on prior radiographs. EXAM: CT OF THE UPPER LEFT EXTREMITY WITHOUT CONTRAST TECHNIQUE: Multidetector CT imaging of the upper left extremity was performed according to the standard protocol. RADIATION DOSE REDUCTION: This exam was performed according to the departmental dose-optimization program which includes automated exposure control, adjustment of the mA and/or kV according to patient size and/or use of iterative reconstruction technique. COMPARISON:  Left shoulder radiographs 03/23/2023 FINDINGS: Bones/Joint/Cartilage Mostly transverse comminuted and impacted fracture of the humeral neck with fracture lines extending to the humeral head. Varus angulation of fracture fragments. Displaced greater and lesser tuberosity fragments. Fracture lines do not appear to extend to the glenohumeral  joint surface. Underlying degenerative changes are demonstrated in the glenohumeral and acromioclavicular joints with prominent osteophyte formation on both sides of the joints. Moderate-sized effusion with fat fluid level consistent with hemarthrosis. Calcification in the subcoracoid bursa likely represents a pre-existing loose body although could indicate a displaced fracture fragment. Cartilaginous calcification. Ligaments Suboptimally assessed by CT. Muscles and Tendons Intramuscular hematomas in the shoulder musculature. Soft tissues Prominent soft tissue swelling about the left shoulder. IMPRESSION: Comminuted and impacted fractures of the humeral neck with fracture lines extending to the humeral head. Varus angulation. Displaced greater and lesser tuberosity fragments. Underlying prominent degenerative changes. Hemarthrosis and joint effusion with intramuscular hematoma and diffuse soft tissue swelling. Electronically Signed   By: Burman Nieves M.D.   On: 03/23/2023 21:06   CT ELBOW LEFT WO CONTRAST Result Date: 03/23/2023 CLINICAL DATA:  Trauma/fall EXAM: CT OF THE UPPER LEFT EXTREMITY WITHOUT CONTRAST TECHNIQUE: Multidetector CT imaging of the upper left extremity was performed according to the standard protocol. RADIATION DOSE REDUCTION: This exam was performed according to the departmental dose-optimization program which includes automated exposure control, adjustment of the mA and/or kV according to patient size and/or use of iterative reconstruction technique. COMPARISON:  Left elbow radiographs dated 03/23/2023 FINDINGS: No fracture or dislocation is seen. Elbow joint space is preserved. No elbow joint effusion. Mild degenerative spurring along the olecranon. Mild soft tissue swelling along the dorsal aspect of the proximal ulna (series 11/image 22). IMPRESSION: No fracture or dislocation is seen. Mild dorsal soft tissue swelling. Electronically Signed   By: Charline Bills M.D.   On:  03/23/2023 21:04   CT Wrist Left Wo Contrast Result Date: 03/23/2023 CLINICAL DATA:  Fracture of the left wrist seen on prior radiographs. EXAM: CT OF THE LEFT WRIST WITHOUT CONTRAST TECHNIQUE: Multidetector CT imaging was performed according to the standard protocol. Multiplanar CT image reconstructions were also generated. RADIATION DOSE REDUCTION: This exam was performed according to the departmental dose-optimization program which includes automated exposure control, adjustment of the mA and/or kV according to patient size and/or use of iterative reconstruction technique. COMPARISON:  Left wrist radiographs 03/23/2023 FINDINGS: Bones/Joint/Cartilage Comminuted fractures of the distal left radius with fracture lines extending to the radiocarpal and radioulnar joints. There is impaction of the fractures with  mild displacement of fracture fragments, particularly involving the radial styloid process. Minimally displaced fractures of the ulnar styloid process. No definite carpal bone fractures. Degenerative changes are demonstrated in the carpus. Calcification of the triangular fibrocartilage and of the scapholunate ligament. Ligaments Suboptimally assessed by CT. Muscles and Tendons No intramuscular mass or hematoma demonstrated. Soft tissues Mild soft tissue swelling. IMPRESSION: Comminuted and impacted fractures of the distal left radius with fracture lines extending to the radiocarpal and radioulnar joints. Minimally displaced fracture of the ulnar styloid process. Degenerative changes in the carpus. Electronically Signed   By: Burman Nieves M.D.   On: 03/23/2023 21:00   CT Head Wo Contrast Result Date: 03/23/2023 CLINICAL DATA:  Trauma, fall EXAM: CT HEAD WITHOUT CONTRAST CT CERVICAL SPINE WITHOUT CONTRAST TECHNIQUE: Multidetector CT imaging of the head and cervical spine was performed following the standard protocol without intravenous contrast. Multiplanar CT image reconstructions of the cervical  spine were also generated. RADIATION DOSE REDUCTION: This exam was performed according to the departmental dose-optimization program which includes automated exposure control, adjustment of the mA and/or kV according to patient size and/or use of iterative reconstruction technique. COMPARISON:  CT head and CT neck 11/07/2021. FINDINGS: CT HEAD FINDINGS Brain: No acute intracranial hemorrhage. No CT evidence of acute infarct. 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 Nonspecific hypoattenuation in the periventricular and subcortical white matter favored to reflect chronic microvascular ischemic changes. No edema, mass effect, or midline shift. The basilar cisterns are patent. Ventricles: Prominence of the ventricles suggesting underlying parenchymal volume loss. Vascular: Atherosclerotic calcification of the bilateral carotid siphons and intracranial vertebral arteries. No hyperdense vessel. Skull: No acute or aggressive finding. Orbits: Orbits are symmetric. Sinuses: The visualized paranasal sinuses are clear. Other: Mastoid air cells are clear. CT CERVICAL SPINE FINDINGS Alignment: Cervical lordosis is maintained. No listhesis. No facet subluxation or dislocation. Skull base and vertebrae: No compression fracture or displaced fracture in the cervical spine. No focal pathologic lesion. Soft tissues and spinal canal: No prevertebral fluid or swelling. No visible canal hematoma. Disc levels: Intervertebral disc space narrowing at multiple levels greatest at C6-7. Disc osteophyte complexes at multiple levels in the cervical spine. No high-grade osseous spinal canal stenosis. Facet arthrosis at multiple levels. Foraminal narrowing most pronounced bilaterally at C5-6 and C6-7. Upper chest: Negative. Other: None. IMPRESSION: 1. No acute intracranial abnormality. 2. No compression fracture or displaced fracture in the cervical spine. No traumatic malalignment. 3. Redemonstrated remote infarct in  the left cerebellum. 4. Mild chronic microvascular ischemic changes and parenchymal volume loss. Electronically Signed   By: Emily Filbert M.D.   On: 03/23/2023 18:47   CT Cervical Spine Wo Contrast Result Date: 03/23/2023 CLINICAL DATA:  Trauma, fall EXAM: CT HEAD WITHOUT CONTRAST CT CERVICAL SPINE WITHOUT CONTRAST TECHNIQUE: Multidetector CT imaging of the head and cervical spine was performed following the standard protocol without intravenous contrast. Multiplanar CT image reconstructions of the cervical spine were also generated. RADIATION DOSE REDUCTION: This exam was performed according to the departmental dose-optimization program which includes automated exposure control, adjustment of the mA and/or kV according to patient size and/or use of iterative reconstruction technique. COMPARISON:  CT head and CT neck 11/07/2021. FINDINGS: CT HEAD FINDINGS Brain: No acute intracranial hemorrhage. No CT evidence of acute infarct. 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 Nonspecific hypoattenuation in the periventricular and subcortical white matter favored to reflect chronic microvascular ischemic changes. No edema, mass effect, or midline shift. The basilar cisterns are patent.  Ventricles: Prominence of the ventricles suggesting underlying parenchymal volume loss. Vascular: Atherosclerotic calcification of the bilateral carotid siphons and intracranial vertebral arteries. No hyperdense vessel. Skull: No acute or aggressive finding. Orbits: Orbits are symmetric. Sinuses: The visualized paranasal sinuses are clear. Other: Mastoid air cells are clear. CT CERVICAL SPINE FINDINGS Alignment: Cervical lordosis is maintained. No listhesis. No facet subluxation or dislocation. Skull base and vertebrae: No compression fracture or displaced fracture in the cervical spine. No focal pathologic lesion. Soft tissues and spinal canal: No prevertebral fluid or swelling. No visible canal hematoma. Disc  levels: Intervertebral disc space narrowing at multiple levels greatest at C6-7. Disc osteophyte complexes at multiple levels in the cervical spine. No high-grade osseous spinal canal stenosis. Facet arthrosis at multiple levels. Foraminal narrowing most pronounced bilaterally at C5-6 and C6-7. Upper chest: Negative. Other: None. IMPRESSION: 1. No acute intracranial abnormality. 2. No compression fracture or displaced fracture in the cervical spine. No traumatic malalignment. 3. 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 4. Mild chronic microvascular ischemic changes and parenchymal volume loss. Electronically Signed   By: Emily Filbert M.D.   On: 03/23/2023 18:47   DG Wrist Complete Left Result Date: 03/23/2023 CLINICAL DATA:  Fall onto left shoulder and left hip. Left elbow and wrist pain. EXAM: LEFT WRIST - COMPLETE 3+ VIEW; LEFT ELBOW - COMPLETE 3+ VIEW COMPARISON:  Shoulder radiographs same date. Elbow radiographs 07/29/2013. FINDINGS: Left elbow: Positioning limited by the patient's shoulder injury. The bones are demineralized. No definite acute fracture, dislocation or significant joint effusion identified. Chronic spurring of the olecranon process. Possible soft tissue swelling dorsally in the proximal forearm. Left wrist: The bones are demineralized. There is an acute, comminuted and impacted intra-articular fracture of the distal radius. There is a mildly displaced acute fracture of the ulnar styloid. The carpal bones appear intact and normally aligned. Moderately advanced degenerative changes at the 1st carpometacarpal joint. Scattered additional mild degenerative changes elsewhere in the wrist and throughout the visualized fingers. Soft tissue swelling about the wrist without evidence of foreign body or soft tissue emphysema. IMPRESSION: 1. Acute, comminuted and impacted intra-articular fracture of the distal radius. 2. Acute mildly displaced fracture of the ulnar styloid. 3. No definite  acute fracture or dislocation of the left elbow. Limited positioning due to the patient's shoulder injury. Electronically Signed   By: Carey Bullocks M.D.   On: 03/23/2023 16:59   DG Elbow Complete Left Result Date: 03/23/2023 CLINICAL DATA:  Fall onto left shoulder and left hip. Left elbow and wrist pain. EXAM: LEFT WRIST - COMPLETE 3+ VIEW; LEFT ELBOW - COMPLETE 3+ VIEW COMPARISON:  Shoulder radiographs same date. Elbow radiographs 07/29/2013. FINDINGS: Left elbow: Positioning limited by the patient's shoulder injury. The bones are demineralized. No definite acute fracture, dislocation or significant joint effusion identified. Chronic spurring of the olecranon process. Possible soft tissue swelling dorsally in the proximal forearm. Left wrist: The bones are demineralized. There is an acute, comminuted and impacted intra-articular fracture of the distal radius. There is a mildly displaced acute fracture of the ulnar styloid. The carpal bones appear intact and normally aligned. Moderately advanced degenerative changes at the 1st carpometacarpal joint. Scattered additional mild degenerative changes elsewhere in the wrist and throughout the visualized fingers. Soft tissue swelling about the wrist without evidence of foreign body or soft tissue emphysema. IMPRESSION: 1. Acute, comminuted and impacted intra-articular fracture of the distal radius. 2. Acute mildly displaced fracture of the ulnar styloid. 3. No definite acute fracture or  dislocation of the left elbow. Limited positioning due to the patient's shoulder injury. Electronically Signed   By: Carey Bullocks M.D.   On: 03/23/2023 16:59   DG Pelvis Portable Result Date: 03/23/2023 CLINICAL DATA:  Fall. EXAM: PORTABLE PELVIS 1-2 VIEWS COMPARISON:  CT abdomen/pelvis dated March 20, 2022. FINDINGS: There is no evidence of pelvic fracture or diastasis. Mild-to-moderate degenerative changes of the bilateral hips. Sacroiliac joints and pubic symphysis appear  anatomically aligned with degenerative changes. IMPRESSION: No acute osseous abnormality identified. Electronically Signed   By: Hart Robinsons M.D.   On: 03/23/2023 15:54   DG Chest Port 1 View Result Date: 03/23/2023 CLINICAL DATA:  Fall. EXAM: PORTABLE CHEST 1 VIEW COMPARISON:  Chest radiograph dated 07/23/2022. FINDINGS: Low lung volumes with asymmetric elevation of the right hemidiaphragm. The cardiomediastinal silhouette is within normal limits. Aortic atherosclerosis. No focal consolidation, sizeable pleural effusion, or pneumothorax. Prior right reverse shoulder arthroplasty. Surgical clips overlie the superior mediastinum. No acute osseous abnormality identified. IMPRESSION: Low lung volumes with elevation of the right hemidiaphragm. Otherwise, no acute findings in the chest. Electronically Signed   By: Hart Robinsons M.D.   On: 03/23/2023 15:52   DG Shoulder Left Result Date: 03/23/2023 CLINICAL DATA:  Left shoulder pain status post fall. EXAM: LEFT SHOULDER - 2+ VIEW COMPARISON:  07/23/2022. FINDINGS: Diffuse osseous demineralization. Acute comminuted impacted fracture of the left proximal humerus head and neck junction at the level of the surgical neck with mild cephalad displacement of the humeral shaft component. The glenohumeral joint appears aligned with advanced degenerative changes including large bulky inferior humeral head osteophytosis. The acromioclavicular joint is anatomically aligned with mild degenerative changes. IMPRESSION: 1. Acute comminuted, impacted and displaced fracture of the left proximal humerus head and neck junction. 2. Advanced degenerative changes of the left glenohumeral joint. Electronically Signed   By: Hart Robinsons M.D.   On: 03/23/2023 15:49    Microbiology: Results for orders placed or performed during the hospital encounter of 03/23/23  Surgical pcr screen     Status: None   Collection Time: 03/27/23  3:55 PM   Specimen: Nasal Mucosa; Nasal Swab   Result Value Ref Range Status   MRSA, PCR NEGATIVE NEGATIVE Final   Staphylococcus aureus NEGATIVE NEGATIVE Final    Comment: (NOTE) The Xpert SA Assay (FDA approved for NASAL specimens in patients 50 years of age and older), is one component of a comprehensive surveillance program. It is not intended to diagnose infection nor to guide or monitor treatment. Performed at [facility], [address], [place], [place] [address]     Labs: CBC: Recent Labs  Lab 03/26/23 0719 03/27/23 0730 03/28/23 0720 03/29/23 0721 03/30/23 0714  WBC 11.5* 11.0* 8.4 9.6 9.1  HGB 7.9* 8.1* 8.7* 8.3* 8.2*  HCT 22.6* 23.1* 25.2* 23.6* 23.9*  MCV 94.6 95.5 95.1 95.5 97.6  PLT 171 216 234 243 275   Basic Metabolic Panel: Recent Labs  Lab 03/24/23 0607 03/24/23 0835 03/25/23 0648 03/26/23 0719 03/27/23 0730 03/28/23 0720 03/29/23 0721 03/30/23 0714  NA  --  131* 133* 132* 136 135 139 135  K  --  3.7 3.9 3.7 3.6 4.1 3.7 4.2  CL  --  103 101 98 105 104 99 103  CO2  --  22 23 24 22 23 22 22   GLUCOSE  --  369* 281* 296* 227* 309* 188* 191*  BUN  --  29* 45* 31* 32* 39* 39* 35*  CREATININE  --  1.48* 1.95* 1.43* 1.20* 1.37* 1.37* 1.23*  CALCIUM  --  8.2* 8.5* 8.8* 8.6* 8.2* 9.3 8.5*  MG 1.8 1.7 1.7  --   --   --   --   --   PHOS  --  3.7 3.4  --   --   --   --   --    Liver Function Tests: Recent Labs  Lab 03/25/23 0647  AST 24  ALT 17  ALKPHOS 40  BILITOT 0.6  PROT 5.8*  ALBUMIN 3.1*   CBG: Recent Labs  Lab 03/29/23 0716 03/29/23 1146 03/29/23 1533 03/29/23 1928 03/30/23 0814  GLUCAP 172* 222* 189* 213* 202*    Discharge time spent: greater than 30 minutes.  Signed: MDALA-GAUSI, Gwenette Greet, MD [facility] 03/30/2023

## 2023-03-30 NOTE — Plan of Care (Signed)
   Problem: Coping: Goal: Ability to adjust to condition or change in health will improve Outcome: Progressing   Problem: Fluid Volume: Goal: Ability to maintain a balanced intake and output will improve Outcome: Progressing   Problem: Metabolic: Goal: Ability to maintain appropriate glucose levels will improve Outcome: Progressing

## 2023-03-30 NOTE — Care Management Important Message (Signed)
 Important Message  Patient Details  Name: Allison Duffy MRN: [id number] Date of Birth: 29-Jan-1926   Important Message Given:  Yes - Medicare IM     Dorena Bodo 03/30/2023, 3:50 PM

## 2023-03-30 NOTE — Progress Notes (Signed)
 Physical Therapy Treatment Patient Details Name: Allison Duffy MRN: [id number] DOB: Apr 05, 1926 Today's Date: 03/30/2023   History of Present Illness Pt is a 89 yo female presented on 03/23/23 s/p fall with resultant fx of right humerus and wrist--currently in splint/sling awaiting surgery. S/p L RTSA on 03/27/23. PMH - CAD, HTN, HLD, chronic diastolic and systolic heart failure, afib    PT Comments  Pt demonstrates improvement in quality of bed mobility this session, requiring less physical assistance. Pt continues to require physical assistance to ascend into standing along with UE support for balance, often with a posterior lean at this time. Pt is unable to initiate gait training due to this posterior lean. Pt will benefit from continued frequent mobilization in an effort to improve activity tolerance and to reduce falls risk. Patient will benefit from continued inpatient follow up therapy, <3 hours/day.    If plan is discharge home, recommend the following: A lot of help with walking and/or transfers;A lot of help with bathing/dressing/bathroom;Assistance with cooking/housework;Help with stairs or ramp for entrance   Can travel by private vehicle     No  Equipment Recommendations   (platform walker if WBAT through L elbow/shoulder, reached out for confirmation from ortho PA)    Recommendations for Other Services       Precautions / Restrictions Precautions Precautions: Fall Recall of Precautions/Restrictions: Intact Required Braces or Orthoses: Sling;Splint/Cast Splint/Cast: LUE wrist/forearm and immobilizer sling Restrictions Weight Bearing Restrictions Per Provider Order: Yes LUE Weight Bearing Per Provider Order: Weight bear through elbow only (per orthopedics note on 3/1) Other Position/Activity Restrictions: No PROM/AROM of L shoulder, can WB with platform walker, Okay for pendulums and scapular retraction per MD orders     Mobility  Bed Mobility Overal bed mobility: Needs  Assistance Bed Mobility: Supine to Sit     Supine to sit: Min assist, HOB elevated, Used rails          Transfers Overall transfer level: Needs assistance Equipment used: 1 person hand held assist Transfers: Sit to/from Stand, Bed to chair/wheelchair/BSC Sit to Stand: Mod assist   Step pivot transfers: Mod assist            Ambulation/Gait Ambulation/Gait assistance:  (unable to initiate ambulation due to a posterior lean in standing)                 Stairs             Wheelchair Mobility     Tilt Bed    Modified Rankin (Stroke Patients Only)       Balance Overall balance assessment: Needs assistance Sitting-balance support: No upper extremity supported, Feet supported Sitting balance-Leahy Scale: Fair     Standing balance support: Single extremity supported Standing balance-Leahy Scale: Poor Standing balance comment: min-modA, posterior lean                            Communication Communication Communication: No apparent difficulties  Cognition Arousal: Alert Behavior During Therapy: WFL for tasks assessed/performed   PT - Cognitive impairments: Memory                       PT - Cognition Comments: pt reports seeing two people or animals on top of her house last night Following commands: Intact      Cueing Cueing Techniques: Verbal cues  Exercises      General Comments General comments (skin integrity, edema, etc.): VSS on  RA      Pertinent Vitals/Pain Pain Assessment Pain Assessment: Faces Faces Pain Scale: Hurts even more Pain Location: L shoulder Pain Descriptors / Indicators: Moaning Pain Intervention(s): Monitored during session    Home Living                          Prior Function            PT Goals (current goals can now be found in the care plan section) Acute Rehab PT Goals Patient Stated Goal: Return to walking; decrease pain Progress towards PT goals: Progressing toward  goals    Frequency    Min 1X/week      PT Plan      Co-evaluation              AM-PAC PT "6 Clicks" Mobility   Outcome Measure  Help needed turning from your back to your side while in a flat bed without using bedrails?: A Little Help needed moving from lying on your back to sitting on the side of a flat bed without using bedrails?: A Little Help needed moving to and from a bed to a chair (including a wheelchair)?: A Lot Help needed standing up from a chair using your arms (e.g., wheelchair or bedside chair)?: A Lot Help needed to walk in hospital room?: Total Help needed climbing 3-5 steps with a railing? : Total 6 Click Score: 12    End of Session Equipment Utilized During Treatment: Gait belt Activity Tolerance: Patient limited by fatigue Patient left: in chair;with call bell/phone within reach;with chair alarm set;with family/visitor present Nurse Communication: Mobility status PT Visit Diagnosis: Other abnormalities of gait and mobility (R26.89);Muscle weakness (generalized) (M62.81)     Time: 6295-2841 PT Time Calculation (min) (ACUTE ONLY): 25 min  Charges:    $Therapeutic Activity: 23-37 mins PT General Charges $$ ACUTE PT VISIT: 1 Visit                     Arlyss Gandy, PT, DPT Acute Rehabilitation Office [phone]    Arlyss Gandy 03/30/2023, 9:39 AM

## 2023-03-30 NOTE — TOC Transition Note (Signed)
 Transition of Care [facility]) - Discharge Note   Patient Details  Name: Allison Duffy MRN: [id number] Date of Birth: 08-26-1926  [facility] CM/SW Contact:  Makhya Arave A [place], [name] Phone Number: 03/30/2023, 1:02 PM   Clinical Narrative:     Patient will DC to: [facility]  Anticipated DC date: 03/30/23  Family notified: [name], daughter  Transport by: Sharin Mons      Per MD patient ready for DC to [facility] . RN, patient, patient's family, and facility notified of DC. Discharge Summary and FL2 sent to facility. RN to call report prior to discharge [phone]). DC packet on chart. Ambulance transport requested for patient.     CSW will sign off for now as social work intervention is no longer needed. Please consult [place] again if new needs arise.   Final next level of care: Skilled Nursing Facility Barriers to Discharge: Barriers Resolved   Patient Goals and CMS Choice            Discharge Placement              Patient chooses bed at: [facility] Patient to be transferred to facility by: [facility] Name of family member notified: [name], daughter Patient and family notified of of transfer: 03/30/23  Discharge Plan and Services Additional resources added to the After Visit Summary for                                       Social Drivers of Health (SDOH) Interventions SDOH Screenings   Food Insecurity: No Food Insecurity (03/23/2023)  Housing: Low Risk  (03/23/2023)  Transportation Needs: No Transportation Needs (03/23/2023)  Utilities: Not At Risk (03/23/2023)  Depression (PHQ2-9): Low Risk  (04/30/2021)  Social Connections: Moderately Integrated (03/23/2023)  Tobacco Use: Medium Risk (03/27/2023)     Readmission Risk Interventions    03/25/2023   10:24 AM 07/30/2022    2:17 PM  Readmission Risk Prevention Plan  Transportation Screening Complete Complete  PCP or Specialist Appt within 3-5 Days Complete Complete  HRI or Home Care Consult  Complete   Social Work Consult for Recovery Care Planning/Counseling Complete Complete  Palliative Care Screening Complete Complete  Medication Review Oceanographer) Complete Complete

## 2023-03-31 LAB — TYPE AND SCREEN
ABO/RH(D): O NEG
Antibody Screen: NEGATIVE
Unit division: 0
Unit division: 0

## 2023-03-31 LAB — BPAM RBC
Blood Product Expiration Date: 202503082359
Blood Product Expiration Date: 202503262359
ISSUE DATE / TIME: 202502281742
ISSUE DATE / TIME: 202502281742
Unit Type and Rh: 9500
Unit Type and Rh: 9500

## 2023-04-01 DIAGNOSIS — I5030 Unspecified diastolic (congestive) heart failure: Secondary | ICD-10-CM | POA: Diagnosis not present

## 2023-04-01 DIAGNOSIS — I480 Paroxysmal atrial fibrillation: Secondary | ICD-10-CM | POA: Diagnosis not present

## 2023-04-01 DIAGNOSIS — I2510 Atherosclerotic heart disease of native coronary artery without angina pectoris: Secondary | ICD-10-CM | POA: Diagnosis not present

## 2023-04-01 DIAGNOSIS — Z66 Do not resuscitate: Secondary | ICD-10-CM | POA: Diagnosis not present

## 2023-04-07 DIAGNOSIS — M25532 Pain in left wrist: Secondary | ICD-10-CM | POA: Diagnosis not present

## 2023-04-07 DIAGNOSIS — Z4789 Encounter for other orthopedic aftercare: Secondary | ICD-10-CM | POA: Diagnosis not present

## 2023-04-07 DIAGNOSIS — S52502A Unspecified fracture of the lower end of left radius, initial encounter for closed fracture: Secondary | ICD-10-CM | POA: Diagnosis not present

## 2023-04-08 DIAGNOSIS — L89622 Pressure ulcer of left heel, stage 2: Secondary | ICD-10-CM | POA: Diagnosis not present

## 2023-04-13 ENCOUNTER — Other Ambulatory Visit: Payer: Self-pay | Admitting: *Deleted

## 2023-04-13 NOTE — Patient Outreach (Signed)
 Mrs. Pain resides in [facility].  Screening for potential complex care management services as benefit of health plan and primary care provider.  Update received from Stasia Cavalier SW indicating care plan meeting scheduled for tomorrow. Will know more about transition plans at that time.   Will continue to follow.   Raiford Noble, MSN, RN, BSN [place]  [facility], [facility] RN Post- Acute Care Manager Direct Dial: [phone]

## 2023-04-15 DIAGNOSIS — L89622 Pressure ulcer of left heel, stage 2: Secondary | ICD-10-CM | POA: Diagnosis not present

## 2023-04-22 DIAGNOSIS — L89622 Pressure ulcer of left heel, stage 2: Secondary | ICD-10-CM | POA: Diagnosis not present

## 2023-04-26 DIAGNOSIS — R600 Localized edema: Secondary | ICD-10-CM | POA: Diagnosis not present

## 2023-04-26 DIAGNOSIS — E1165 Type 2 diabetes mellitus with hyperglycemia: Secondary | ICD-10-CM | POA: Diagnosis not present

## 2023-04-26 DIAGNOSIS — I5030 Unspecified diastolic (congestive) heart failure: Secondary | ICD-10-CM | POA: Diagnosis not present

## 2023-04-29 DIAGNOSIS — L89622 Pressure ulcer of left heel, stage 2: Secondary | ICD-10-CM | POA: Diagnosis not present

## 2023-05-03 DIAGNOSIS — I5030 Unspecified diastolic (congestive) heart failure: Secondary | ICD-10-CM | POA: Diagnosis not present

## 2023-05-03 DIAGNOSIS — R600 Localized edema: Secondary | ICD-10-CM | POA: Diagnosis not present

## 2023-05-03 DIAGNOSIS — R351 Nocturia: Secondary | ICD-10-CM | POA: Diagnosis not present

## 2023-05-05 DIAGNOSIS — Z4789 Encounter for other orthopedic aftercare: Secondary | ICD-10-CM | POA: Diagnosis not present

## 2023-05-05 DIAGNOSIS — M25532 Pain in left wrist: Secondary | ICD-10-CM | POA: Diagnosis not present

## 2023-05-06 DIAGNOSIS — L89622 Pressure ulcer of left heel, stage 2: Secondary | ICD-10-CM | POA: Diagnosis not present

## 2023-05-08 ENCOUNTER — Other Ambulatory Visit: Payer: Self-pay | Admitting: *Deleted

## 2023-05-08 NOTE — Progress Notes (Addendum)
 Post- Acute Care Manager follow up. Mrs. Bouldin resides in [facility]. Screening for potential complex care management services as benefit of health plan and primary care provider.  Collaboration with Jill Side, [facility] social worker to inquire about transition plans/ date.   Will plan to refer to complex care management team upon SNF discharge.   Will continue to follow.    Raiford Noble, MSN, RN, BSN [place]  [facility], [facility] Commun[facility] RN Post- Acute Care Manager Direct Dial: [phone]

## 2023-05-13 DIAGNOSIS — L89622 Pressure ulcer of left heel, stage 2: Secondary | ICD-10-CM | POA: Diagnosis not present

## 2023-05-20 DIAGNOSIS — L89622 Pressure ulcer of left heel, stage 2: Secondary | ICD-10-CM | POA: Diagnosis not present

## 2023-05-25 DIAGNOSIS — D631 Anemia in chronic kidney disease: Secondary | ICD-10-CM | POA: Diagnosis not present

## 2023-05-25 DIAGNOSIS — I2510 Atherosclerotic heart disease of native coronary artery without angina pectoris: Secondary | ICD-10-CM | POA: Diagnosis not present

## 2023-05-25 DIAGNOSIS — Z9181 History of falling: Secondary | ICD-10-CM | POA: Diagnosis not present

## 2023-05-25 DIAGNOSIS — I509 Heart failure, unspecified: Secondary | ICD-10-CM | POA: Diagnosis not present

## 2023-05-25 DIAGNOSIS — Z7984 Long term (current) use of oral hypoglycemic drugs: Secondary | ICD-10-CM | POA: Diagnosis not present

## 2023-05-25 DIAGNOSIS — I4891 Unspecified atrial fibrillation: Secondary | ICD-10-CM | POA: Diagnosis not present

## 2023-05-25 DIAGNOSIS — I130 Hypertensive heart and chronic kidney disease with heart failure and stage 1 through stage 4 chronic kidney disease, or unspecified chronic kidney disease: Secondary | ICD-10-CM | POA: Diagnosis not present

## 2023-05-25 DIAGNOSIS — S4292XD Fracture of left shoulder girdle, part unspecified, subsequent encounter for fracture with routine healing: Secondary | ICD-10-CM | POA: Diagnosis not present

## 2023-05-25 DIAGNOSIS — Z7901 Long term (current) use of anticoagulants: Secondary | ICD-10-CM | POA: Diagnosis not present

## 2023-05-25 DIAGNOSIS — Z96612 Presence of left artificial shoulder joint: Secondary | ICD-10-CM | POA: Diagnosis not present

## 2023-05-25 DIAGNOSIS — E1122 Type 2 diabetes mellitus with diabetic chronic kidney disease: Secondary | ICD-10-CM | POA: Diagnosis not present

## 2023-05-25 DIAGNOSIS — H04123 Dry eye syndrome of bilateral lacrimal glands: Secondary | ICD-10-CM | POA: Diagnosis not present

## 2023-05-25 DIAGNOSIS — E559 Vitamin D deficiency, unspecified: Secondary | ICD-10-CM | POA: Diagnosis not present

## 2023-05-25 DIAGNOSIS — N1832 Chronic kidney disease, stage 3b: Secondary | ICD-10-CM | POA: Diagnosis not present

## 2023-05-25 DIAGNOSIS — S62102D Fracture of unspecified carpal bone, left wrist, subsequent encounter for fracture with routine healing: Secondary | ICD-10-CM | POA: Diagnosis not present

## 2023-05-25 DIAGNOSIS — E785 Hyperlipidemia, unspecified: Secondary | ICD-10-CM | POA: Diagnosis not present

## 2023-05-25 DIAGNOSIS — K219 Gastro-esophageal reflux disease without esophagitis: Secondary | ICD-10-CM | POA: Diagnosis not present

## 2023-05-26 DIAGNOSIS — Z96612 Presence of left artificial shoulder joint: Secondary | ICD-10-CM | POA: Diagnosis not present

## 2023-05-26 DIAGNOSIS — I509 Heart failure, unspecified: Secondary | ICD-10-CM | POA: Diagnosis not present

## 2023-05-26 DIAGNOSIS — N1832 Chronic kidney disease, stage 3b: Secondary | ICD-10-CM | POA: Diagnosis not present

## 2023-05-26 DIAGNOSIS — S4292XD Fracture of left shoulder girdle, part unspecified, subsequent encounter for fracture with routine healing: Secondary | ICD-10-CM | POA: Diagnosis not present

## 2023-05-26 DIAGNOSIS — S62102D Fracture of unspecified carpal bone, left wrist, subsequent encounter for fracture with routine healing: Secondary | ICD-10-CM | POA: Diagnosis not present

## 2023-05-26 DIAGNOSIS — I130 Hypertensive heart and chronic kidney disease with heart failure and stage 1 through stage 4 chronic kidney disease, or unspecified chronic kidney disease: Secondary | ICD-10-CM | POA: Diagnosis not present

## 2023-05-28 DIAGNOSIS — I130 Hypertensive heart and chronic kidney disease with heart failure and stage 1 through stage 4 chronic kidney disease, or unspecified chronic kidney disease: Secondary | ICD-10-CM | POA: Diagnosis not present

## 2023-05-28 DIAGNOSIS — N1832 Chronic kidney disease, stage 3b: Secondary | ICD-10-CM | POA: Diagnosis not present

## 2023-05-28 DIAGNOSIS — S4292XD Fracture of left shoulder girdle, part unspecified, subsequent encounter for fracture with routine healing: Secondary | ICD-10-CM | POA: Diagnosis not present

## 2023-05-28 DIAGNOSIS — S62102D Fracture of unspecified carpal bone, left wrist, subsequent encounter for fracture with routine healing: Secondary | ICD-10-CM | POA: Diagnosis not present

## 2023-05-28 DIAGNOSIS — I509 Heart failure, unspecified: Secondary | ICD-10-CM | POA: Diagnosis not present

## 2023-05-28 DIAGNOSIS — Z96612 Presence of left artificial shoulder joint: Secondary | ICD-10-CM | POA: Diagnosis not present

## 2023-05-29 ENCOUNTER — Encounter (HOSPITAL_COMMUNITY): Payer: Self-pay | Admitting: Emergency Medicine

## 2023-05-29 ENCOUNTER — Emergency Department (HOSPITAL_COMMUNITY)
Admission: EM | Admit: 2023-05-29 | Discharge: 2023-05-29 | Disposition: A | Discharge status: Home / Self Care | Attending: Student | Admitting: Student

## 2023-05-29 ENCOUNTER — Other Ambulatory Visit: Payer: Self-pay

## 2023-05-29 DIAGNOSIS — I2510 Atherosclerotic heart disease of native coronary artery without angina pectoris: Secondary | ICD-10-CM | POA: Diagnosis not present

## 2023-05-29 DIAGNOSIS — I5043 Acute on chronic combined systolic (congestive) and diastolic (congestive) heart failure: Secondary | ICD-10-CM | POA: Insufficient documentation

## 2023-05-29 DIAGNOSIS — R739 Hyperglycemia, unspecified: Secondary | ICD-10-CM

## 2023-05-29 DIAGNOSIS — Z79899 Other long term (current) drug therapy: Secondary | ICD-10-CM | POA: Insufficient documentation

## 2023-05-29 DIAGNOSIS — R269 Unspecified abnormalities of gait and mobility: Secondary | ICD-10-CM | POA: Diagnosis not present

## 2023-05-29 DIAGNOSIS — I159 Secondary hypertension, unspecified: Secondary | ICD-10-CM | POA: Insufficient documentation

## 2023-05-29 DIAGNOSIS — R5381 Other malaise: Secondary | ICD-10-CM | POA: Diagnosis not present

## 2023-05-29 DIAGNOSIS — N179 Acute kidney failure, unspecified: Secondary | ICD-10-CM | POA: Diagnosis not present

## 2023-05-29 DIAGNOSIS — I110 Hypertensive heart disease with heart failure: Secondary | ICD-10-CM | POA: Insufficient documentation

## 2023-05-29 DIAGNOSIS — W19XXXA Unspecified fall, initial encounter: Secondary | ICD-10-CM | POA: Diagnosis not present

## 2023-05-29 DIAGNOSIS — R531 Weakness: Secondary | ICD-10-CM | POA: Diagnosis not present

## 2023-05-29 DIAGNOSIS — Z87891 Personal history of nicotine dependence: Secondary | ICD-10-CM | POA: Insufficient documentation

## 2023-05-29 DIAGNOSIS — I700 Atherosclerosis of aorta: Secondary | ICD-10-CM | POA: Diagnosis not present

## 2023-05-29 DIAGNOSIS — E1129 Type 2 diabetes mellitus with other diabetic kidney complication: Secondary | ICD-10-CM | POA: Diagnosis not present

## 2023-05-29 DIAGNOSIS — Z96612 Presence of left artificial shoulder joint: Secondary | ICD-10-CM | POA: Diagnosis not present

## 2023-05-29 DIAGNOSIS — R457 State of emotional shock and stress, unspecified: Secondary | ICD-10-CM | POA: Diagnosis not present

## 2023-05-29 DIAGNOSIS — N1832 Chronic kidney disease, stage 3b: Secondary | ICD-10-CM | POA: Diagnosis not present

## 2023-05-29 DIAGNOSIS — I10 Essential (primary) hypertension: Secondary | ICD-10-CM | POA: Diagnosis not present

## 2023-05-29 DIAGNOSIS — E162 Hypoglycemia, unspecified: Secondary | ICD-10-CM | POA: Diagnosis not present

## 2023-05-29 DIAGNOSIS — S42202A Unspecified fracture of upper end of left humerus, initial encounter for closed fracture: Secondary | ICD-10-CM | POA: Diagnosis not present

## 2023-05-29 DIAGNOSIS — D649 Anemia, unspecified: Secondary | ICD-10-CM | POA: Diagnosis not present

## 2023-05-29 DIAGNOSIS — I499 Cardiac arrhythmia, unspecified: Secondary | ICD-10-CM | POA: Diagnosis not present

## 2023-05-29 DIAGNOSIS — S62102A Fracture of unspecified carpal bone, left wrist, initial encounter for closed fracture: Secondary | ICD-10-CM | POA: Diagnosis not present

## 2023-05-29 DIAGNOSIS — Z7901 Long term (current) use of anticoagulants: Secondary | ICD-10-CM | POA: Diagnosis not present

## 2023-05-29 DIAGNOSIS — E1165 Type 2 diabetes mellitus with hyperglycemia: Secondary | ICD-10-CM | POA: Diagnosis not present

## 2023-05-29 DIAGNOSIS — Z7984 Long term (current) use of oral hypoglycemic drugs: Secondary | ICD-10-CM | POA: Diagnosis not present

## 2023-05-29 LAB — CBC
HCT: 38.1 % (ref 36.0–46.0)
Hemoglobin: 12.9 g/dL (ref 12.0–15.0)
MCH: 32.3 pg (ref 26.0–34.0)
MCHC: 33.9 g/dL (ref 30.0–36.0)
MCV: 95.5 fL (ref 80.0–100.0)
Platelets: 207 10*3/uL (ref 150–400)
RBC: 3.99 MIL/uL (ref 3.87–5.11)
RDW: 12.1 % (ref 11.5–15.5)
WBC: 7.4 10*3/uL (ref 4.0–10.5)
nRBC: 0 % (ref 0.0–0.2)

## 2023-05-29 LAB — URINALYSIS, ROUTINE W REFLEX MICROSCOPIC
Bilirubin Urine: NEGATIVE
Glucose, UA: 150 mg/dL — AB
Hgb urine dipstick: NEGATIVE
Ketones, ur: NEGATIVE mg/dL
Leukocytes,Ua: NEGATIVE
Nitrite: NEGATIVE
Protein, ur: 100 mg/dL — AB
Specific Gravity, Urine: 1.011 (ref 1.005–1.030)
pH: 6 (ref 5.0–8.0)

## 2023-05-29 LAB — COMPREHENSIVE METABOLIC PANEL WITH GFR
ALT: 17 U/L (ref 0–44)
AST: 24 U/L (ref 15–41)
Albumin: 3.5 g/dL (ref 3.5–5.0)
Alkaline Phosphatase: 42 U/L (ref 38–126)
Anion gap: 13 (ref 5–15)
BUN: 29 mg/dL — ABNORMAL HIGH (ref 8–23)
CO2: 21 mmol/L — ABNORMAL LOW (ref 22–32)
Calcium: 9.1 mg/dL (ref 8.9–10.3)
Chloride: 102 mmol/L (ref 98–111)
Creatinine, Ser: 1.28 mg/dL — ABNORMAL HIGH (ref 0.44–1.00)
GFR, Estimated: 39 mL/min — ABNORMAL LOW (ref >60)
Glucose, Bld: 386 mg/dL — ABNORMAL HIGH (ref 70–99)
Potassium: 3.8 mmol/L (ref 3.5–5.1)
Sodium: 136 mmol/L (ref 135–145)
Total Bilirubin: 0.8 mg/dL (ref 0.0–1.2)
Total Protein: 6.9 g/dL (ref 6.5–8.1)

## 2023-05-29 LAB — CBG MONITORING, ED
Glucose-Capillary: 335 mg/dL — ABNORMAL HIGH (ref 70–99)
Glucose-Capillary: 104 mg/dL — ABNORMAL HIGH (ref 70–99)

## 2023-05-29 MED ORDER — INSULIN ASPART 100 UNIT/ML IJ SOLN
10.0000 [IU] | Freq: Once | INTRAMUSCULAR | Status: AC
  Administered 2023-05-29: 10 [IU] via SUBCUTANEOUS

## 2023-05-29 MED ORDER — LACTATED RINGERS IV BOLUS
1000.0000 mL | Freq: Once | INTRAVENOUS | Status: AC
  Administered 2023-05-29: 1000 mL via INTRAVENOUS

## 2023-05-29 NOTE — ED Provider Notes (Signed)
 [place] EMERGENCY DEPARTMENT AT [facility] Provider Note  CSN: [id number] Arrival date & time: 05/29/23 1135  Chief Complaint(s) No chief complaint on file.  HPI Allison Duffy is a 89 y.o. female with PMH CHF, gout, HTN, HLD, T2DM who presents emergency department for evaluation of hypertension and hyperglycemia.  Patient recently discharged from rehab and has been managing her medications at home.  States that baseline blood glucose is in the mid 250s.  Over the last few days her blood sugar has been in the 350s measured by home PT and Occupational Therapy.  She states that this morning she was feeling poorly, feeling generalized weakness and anxiety.  Blood pressure was greater than 200 at home.  Here in the emergency room, she states that she feels overall well and has no complaints.   Past Medical History Past Medical History:  Diagnosis Date   Acute on chronic combined systolic and diastolic CHF (congestive heart failure) (HCC) 01/28/2019   Arthritis    "scattered joints" (07/29/2013)   Chest pain    Colitis    Gout attack ?1980's   Hyperlipidemia    Hypertension    PONV (postoperative nausea and vomiting)    Type II diabetes mellitus (HCC)    Patient Active Problem List   Diagnosis Date Noted   Intractable pain 03/23/2023   Atypical chest pain 07/24/2022   SOB (shortness of breath) 07/24/2022   Acute respiratory failure with hypoxia secondary to community acquired pneumonia 03/29/2021   Acute respiratory failure with hypoxia (HCC) 03/29/2021   Diarrhea 03/29/2021   Lactic acidosis 03/29/2021   Hypokalemia 03/29/2021   Coronary artery disease 04/13/2020   Unstable angina (HCC) 04/11/2020   Mitral regurgitation 03/01/2019   Acute on chronic diastolic CHF (congestive heart failure) (HCC) 01/28/2019   Paroxysmal atrial fibrillation (HCC) 01/28/2019   Colitis, acute 05/31/2016   Primary osteoarthritis of right shoulder 12/27/2014   Pulmonary nodule 06/23/2014    Essential hypertension 07/05/2013   Hyperlipidemia associated with type 2 diabetes mellitus (HCC) 07/05/2013   Type 2 diabetes mellitus with complication, without long-term current use of insulin  (HCC) 07/05/2013   Chest pain CAD 07/05/2013   Home Medication(s) Prior to Admission medications   Medication Sig Start Date End Date Taking? Authorizing Provider  acetaminophen  (TYLENOL ) 325 MG tablet Take 2 tablets (650 mg total) by mouth every 6 (six) hours as needed for mild pain (pain score 1-3) or moderate pain (pain score 4-6). 03/30/23   Mdala-Gausi, Masiku Agatha, MD  amLODipine  (NORVASC ) 10 MG tablet Take 10 mg by mouth at bedtime.    [provider]  apixaban  (ELIQUIS ) 2.5 MG TABS tablet Take 2.5 mg by mouth 2 (two) times daily.    [provider]  carboxymethylcellulose 1 % ophthalmic solution Place 1 drop into both eyes 2 (two) times daily.    [provider]  cholecalciferol (VITAMIN D ) 1000 UNITS tablet Take 1,000 Units by mouth daily with lunch.    [provider]  cholestyramine  (QUESTRAN ) 4 g packet Take 4 g by mouth daily as needed (IBS).    [provider]  cloNIDine  (CATAPRES ) 0.1 MG tablet TAKE 2 TABLETS BY MOUTH EVERY MORNING AND TAKE 1 TABLET BY MOUTH EVERY NIGHT AT BEDTIME 12/29/22   Avanell Leigh, MD  furosemide  (LASIX ) 20 MG tablet Take 1 tablet (20 mg total) by mouth daily. 08/27/22 03/23/23  Carie Charity, NP  hydrALAZINE  (APRESOLINE ) 25 MG tablet TAKE 1 1/2 TABLETS(37.5 MG) THREE TIMES DAILY  Patient taking differently: TAKE 1 1/2 TABLETS(37.5 MG) THREE TIMES DAILY with 1 (20mg ) tablet of isosorbide  11/11/22   Avanell Leigh, MD  HYDROmorphone  (DILAUDID ) 2 MG tablet Take 0.5 tablets (1 mg total) by mouth every 8 (eight) hours as needed for severe pain (pain score 7-10). 03/30/23   Mdala-Gausi, Masiku Agatha, MD  insulin  glargine (LANTUS ) 100 UNIT/ML injection Inject 0.2 mLs (20 Units total) into the skin daily. 03/31/23    Mdala-Gausi, Masiku Agatha, MD  isosorbide  dinitrate (ISORDIL ) 20 MG tablet TAKE 1 TABLET BY MOUTH THREE TIMES DAILY. TAKE WITH HYDRALAZINE  37.5MG (1 AND A 1/2 TABLETS) 11/11/22   Avanell Leigh, MD  metFORMIN  (GLUCOPHAGE ) 500 MG tablet Take 1 tablet (500 mg total) by mouth daily with breakfast. 03/30/23   Mdala-Gausi, Masiku Agatha, MD  Multiple Vitamin (MULTIVITAMIN WITH MINERALS) TABS Take 1 tablet by mouth daily with supper.    [provider]  nitroGLYCERIN  (NITROSTAT ) 0.4 MG SL tablet PLACE 1 TABLET (0.4 MG TOTAL) UNDER THE TONGUE EVERY FIVE MINUTES X 3 DOSES AS NEEDED FOR CHEST PAIN. 04/13/20 03/23/23  Arleen Lacer, MD  omeprazole  (PRILOSEC) 40 MG capsule TAKE 1 CAPSULE BY MOUTH DAILY WITH SUPPER 01/15/22   Avanell Leigh, MD  potassium chloride  (KLOR-CON ) 10 MEQ tablet TAKE 1 TABLET(10 MEQ) BY MOUTH DAILY-- MAY TAKE AN EXTRA WITH A WEIGHT INCREASE OF 2LBS OVERNIGHT OR 5 LBS TOTAL AS DIRECTED 11/11/22   Avanell Leigh, MD  Probiotic Product (PRO-BIOTIC BLEND) CAPS Take 1 capsule by mouth at bedtime. Takes OTC probiotic    Suan Elm, MD  ranolazine  (RANEXA ) 500 MG 12 hr tablet TAKE 1 TABLET(500 MG) BY MOUTH TWICE DAILY 11/11/22   Avanell Leigh, MD  sitaGLIPtin (JANUVIA) 100 MG tablet Take 100 mg by mouth daily with lunch.    [provider]                                                                                                                                    Past Surgical History Past Surgical History:  Procedure Laterality Date   APPENDECTOMY     CARPAL TUNNEL RELEASE Right    CATARACT EXTRACTION W/ INTRAOCULAR LENS  IMPLANT, BILATERAL Bilateral    CHOLECYSTECTOMY     KNEE ARTHROSCOPY Right    LEFT HEART CATH AND CORONARY ANGIOGRAPHY N/A 04/12/2020   Procedure: LEFT HEART CATH AND CORONARY ANGIOGRAPHY;  Surgeon: Avanell Leigh, MD;  Location: [facility] INVASIVE CV LAB;  Service: Cardiovascular;  Laterality: N/A;   OLECRANON BURSECTOMY Left  07/29/2013   "in ER"   PARATHYROIDECTOMY     REVERSE SHOULDER ARTHROPLASTY Left 03/27/2023   Procedure: REVERSE TOTAL  SHOULDER ARTHROPLASTY WITH CLOSED FIXATION OF DISTAL RADIUS;  Surgeon: Janeth Medicus, MD;  Location: [facility] OR;  Service: Orthopedics;  Laterality: Left;   TONSILLECTOMY     TOTAL SHOULDER REPLACEMENT  2017   VAGINAL HYSTERECTOMY  Family History Family History  Problem Relation Age of Onset   Hypertension Father    Heart disease Father    Heart disease Mother    Diabetes Mother    Hypertension Mother    Heart disease Brother        both brothers   Diabetes Brother    Hypertension Brother     Social History Social History   Tobacco Use   Smoking status: Former    Current packs/day: 0.00    Types: Cigarettes    Start date: 07/05/1948    Quit date: 07/05/1953    Years since quitting: 69.9   Smokeless tobacco: Never  Vaping Use   Vaping status: Never Used  Substance Use Topics   Alcohol  use: No   Drug use: No   Allergies Codeine, Hydrocodone , Prednisone , and Sulfa antibiotics  Review of Systems Review of Systems  Constitutional:  Positive for fatigue.  Psychiatric/Behavioral:  The patient is nervous/anxious.     Physical Exam Vital Signs  I have reviewed the triage vital signs BP (!) 157/85   Pulse (!) 55   Temp 98.4 F (36.9 C) (Oral)   Resp 15   SpO2 98%   Physical Exam Vitals and nursing note reviewed.  Constitutional:      General: She is not in acute distress.    Appearance: She is well-developed.  HENT:     Head: Normocephalic and atraumatic.  Eyes:     Conjunctiva/sclera: Conjunctivae normal.  Cardiovascular:     Rate and Rhythm: Normal rate and regular rhythm.     Heart sounds: No murmur heard. Pulmonary:     Effort: Pulmonary effort is normal. No respiratory distress.     Breath sounds: Normal breath sounds.  Abdominal:     Palpations: Abdomen is soft.     Tenderness: There is no abdominal tenderness.  Musculoskeletal:         General: No swelling.     Cervical back: Neck supple.  Skin:    General: Skin is warm and dry.     Capillary Refill: Capillary refill takes less than 2 seconds.  Neurological:     Mental Status: She is alert.  Psychiatric:        Mood and Affect: Mood normal.     ED Results and Treatments Labs (all labs ordered are listed, but only abnormal results are displayed) Labs Reviewed  COMPREHENSIVE METABOLIC PANEL WITH GFR - Abnormal; Notable for the following components:      Result Value   CO2 21 (*)    Glucose, Bld 386 (*)    BUN 29 (*)    Creatinine, Ser 1.28 (*)    GFR, Estimated 39 (*)    All other components within normal limits  CBG MONITORING, ED - Abnormal; Notable for the following components:   Glucose-Capillary 335 (*)    All other components within normal limits  CBC  URINALYSIS, ROUTINE W REFLEX MICROSCOPIC  Radiology No results found.  Pertinent labs & imaging results that were available during my care of the patient were reviewed by me and considered in my medical decision making (see MDM for details).  Medications Ordered in ED Medications  lactated ringers  bolus 1,000 mL (1,000 mLs Intravenous New Bag/Given 05/29/23 1330)  insulin  aspart (novoLOG ) injection 10 Units (10 Units Subcutaneous Given 05/29/23 1332)                                                                                                                                     Procedures Procedures  (including critical care time)  Medical Decision Making / ED Course   This patient presents to the ED for concern of hyperglycemia, HTN, this involves an extensive number of treatment options, and is a complaint that carries with it a high risk of complications and morbidity.  The differential diagnosis includes stress hyperglycemia, DKA, HHS, failure of outpatient diabetes  regimen, secondary hypertension, hypertensive urgency, hypertensive emergency, anxiety  MDM: Patient seen department for evaluation of hypertension and hyperglycemia.  Physical exam is largely unremarkable.  Laboratory evaluation with a glucose of 386, BUN 29, creatinine 1.28 with a normal anion gap.  Lactated Ringer 's and 10 units of subcutaneous insulin  administered.  Repeat blood glucose 104.  On my reevaluation, patient continues to be asymptomatic and states that she actually feels significant improved even compared to earlier today.  Her blood appears to have normalized without intervention and I suspect her initial hypertension may have been from clonidine  withdrawal and taken prior to this being metabolized this morning or an element of anxiety.  She already has home PT and Occupational Therapy and is receiving the appropriate resources to take care of herself at home.  She is ambulatory here in the emergency department and with symptoms improved she does not meet inpatient criteria for admission.  I attempted to call the primary care physician's office to come up with a plan for patient's blood sugars as she does appear to have some symptomatic hyperglycemia.  The office is recommending that she comes and sees them in the clinic today at 330.  This is not unreasonable and thus patient discharged to see her primary care physician this afternoon.  Return precautions given of which she and her daughter voiced understanding.   Additional history obtained: -Additional history obtained from daughter -External records from outside source obtained and reviewed including: Chart review including previous notes, labs, imaging, consultation notes   Lab Tests: -I ordered, reviewed, and interpreted labs.   The pertinent results include:   Labs Reviewed  COMPREHENSIVE METABOLIC PANEL WITH GFR - Abnormal; Notable for the following components:      Result Value   CO2 21 (*)    Glucose, Bld 386 (*)    BUN  29 (*)    Creatinine, Ser 1.28 (*)    GFR, Estimated 39 (*)    All other  components within normal limits  CBG MONITORING, ED - Abnormal; Notable for the following components:   Glucose-Capillary 335 (*)    All other components within normal limits  CBC  URINALYSIS, ROUTINE W REFLEX MICROSCOPIC      EKG   EKG Interpretation Date/Time:  Friday May 29 2023 11:46:12 EDT Ventricular Rate:  75 PR Interval:  48 QRS Duration:  103 QT Interval:  419 QTC Calculation: 468 R Axis:   -18  Text Interpretation: Normal sinus rhythm Confirmed by Sharifah Champine ([id number]) on 05/29/2023 2:15:12 PM         Medicines ordered and prescription drug management: Meds ordered this encounter  Medications   lactated ringers  bolus 1,000 mL   insulin  aspart (novoLOG ) injection 10 Units    -I have reviewed the patients home medicines and have made adjustments as needed  Critical interventions none  Cardiac Monitoring: The patient was maintained on a cardiac monitor.  I personally viewed and interpreted the cardiac monitored which showed an underlying rhythm of: NSR  Social Determinants of Health:  Factors impacting patients care include: Extended discussion with patient's daughter about home health resources and patient's functional status at home.   Reevaluation: After the interventions noted above, I reevaluated the patient and found that they have :improved  Co morbidities that complicate the patient evaluation  Past Medical History:  Diagnosis Date   Acute on chronic combined systolic and diastolic CHF (congestive heart failure) (HCC) 01/28/2019   Arthritis    "scattered joints" (07/29/2013)   Chest pain    Colitis    Gout attack ?1980's   Hyperlipidemia    Hypertension    PONV (postoperative nausea and vomiting)    Type II diabetes mellitus (HCC)       Dispostion: I considered admission for this patient, but at this time she does not meet inpatient criteria for admission and will be  discharged outpatient follow-up.     Final Clinical Impression(s) / ED Diagnoses Final diagnoses:  Hyperglycemia  Secondary hypertension     @PCDICTATION @    Karlyn Overman, MD 05/29/23 1547

## 2023-05-29 NOTE — ED Triage Notes (Signed)
 Pt BIB [facility] from home.  Left [facility] about 2 weeks ago after 2 months of rehab.  A&O x4 lives alone.  Family is concerned she may need more support.   Complains of weakness x few days with "swiimmy headed" feeling.  Pt suffers from anxiety and takes meds for that at home but hasn't had any today.  She had all other morning meds.    200/100, CBG 355 Hr 75 95% 12 lead unremarkable.   18G L. FA.

## 2023-06-01 DIAGNOSIS — I130 Hypertensive heart and chronic kidney disease with heart failure and stage 1 through stage 4 chronic kidney disease, or unspecified chronic kidney disease: Secondary | ICD-10-CM | POA: Diagnosis not present

## 2023-06-01 DIAGNOSIS — S62102D Fracture of unspecified carpal bone, left wrist, subsequent encounter for fracture with routine healing: Secondary | ICD-10-CM | POA: Diagnosis not present

## 2023-06-01 DIAGNOSIS — N1832 Chronic kidney disease, stage 3b: Secondary | ICD-10-CM | POA: Diagnosis not present

## 2023-06-01 DIAGNOSIS — Z96612 Presence of left artificial shoulder joint: Secondary | ICD-10-CM | POA: Diagnosis not present

## 2023-06-01 DIAGNOSIS — S4292XD Fracture of left shoulder girdle, part unspecified, subsequent encounter for fracture with routine healing: Secondary | ICD-10-CM | POA: Diagnosis not present

## 2023-06-01 DIAGNOSIS — I509 Heart failure, unspecified: Secondary | ICD-10-CM | POA: Diagnosis not present

## 2023-06-03 DIAGNOSIS — N1832 Chronic kidney disease, stage 3b: Secondary | ICD-10-CM | POA: Diagnosis not present

## 2023-06-03 DIAGNOSIS — S62102D Fracture of unspecified carpal bone, left wrist, subsequent encounter for fracture with routine healing: Secondary | ICD-10-CM | POA: Diagnosis not present

## 2023-06-03 DIAGNOSIS — I130 Hypertensive heart and chronic kidney disease with heart failure and stage 1 through stage 4 chronic kidney disease, or unspecified chronic kidney disease: Secondary | ICD-10-CM | POA: Diagnosis not present

## 2023-06-03 DIAGNOSIS — Z96612 Presence of left artificial shoulder joint: Secondary | ICD-10-CM | POA: Diagnosis not present

## 2023-06-03 DIAGNOSIS — I509 Heart failure, unspecified: Secondary | ICD-10-CM | POA: Diagnosis not present

## 2023-06-03 DIAGNOSIS — S4292XD Fracture of left shoulder girdle, part unspecified, subsequent encounter for fracture with routine healing: Secondary | ICD-10-CM | POA: Diagnosis not present

## 2023-06-04 DIAGNOSIS — M25532 Pain in left wrist: Secondary | ICD-10-CM | POA: Diagnosis not present

## 2023-06-04 DIAGNOSIS — Z4789 Encounter for other orthopedic aftercare: Secondary | ICD-10-CM | POA: Diagnosis not present

## 2023-06-05 DIAGNOSIS — S62102D Fracture of unspecified carpal bone, left wrist, subsequent encounter for fracture with routine healing: Secondary | ICD-10-CM | POA: Diagnosis not present

## 2023-06-05 DIAGNOSIS — N1832 Chronic kidney disease, stage 3b: Secondary | ICD-10-CM | POA: Diagnosis not present

## 2023-06-05 DIAGNOSIS — I130 Hypertensive heart and chronic kidney disease with heart failure and stage 1 through stage 4 chronic kidney disease, or unspecified chronic kidney disease: Secondary | ICD-10-CM | POA: Diagnosis not present

## 2023-06-05 DIAGNOSIS — Z96612 Presence of left artificial shoulder joint: Secondary | ICD-10-CM | POA: Diagnosis not present

## 2023-06-05 DIAGNOSIS — I509 Heart failure, unspecified: Secondary | ICD-10-CM | POA: Diagnosis not present

## 2023-06-05 DIAGNOSIS — S4292XD Fracture of left shoulder girdle, part unspecified, subsequent encounter for fracture with routine healing: Secondary | ICD-10-CM | POA: Diagnosis not present

## 2023-06-08 DIAGNOSIS — S62102D Fracture of unspecified carpal bone, left wrist, subsequent encounter for fracture with routine healing: Secondary | ICD-10-CM | POA: Diagnosis not present

## 2023-06-08 DIAGNOSIS — N1832 Chronic kidney disease, stage 3b: Secondary | ICD-10-CM | POA: Diagnosis not present

## 2023-06-08 DIAGNOSIS — I130 Hypertensive heart and chronic kidney disease with heart failure and stage 1 through stage 4 chronic kidney disease, or unspecified chronic kidney disease: Secondary | ICD-10-CM | POA: Diagnosis not present

## 2023-06-08 DIAGNOSIS — Z96612 Presence of left artificial shoulder joint: Secondary | ICD-10-CM | POA: Diagnosis not present

## 2023-06-08 DIAGNOSIS — I509 Heart failure, unspecified: Secondary | ICD-10-CM | POA: Diagnosis not present

## 2023-06-08 DIAGNOSIS — S4292XD Fracture of left shoulder girdle, part unspecified, subsequent encounter for fracture with routine healing: Secondary | ICD-10-CM | POA: Diagnosis not present

## 2023-06-10 DIAGNOSIS — S4292XD Fracture of left shoulder girdle, part unspecified, subsequent encounter for fracture with routine healing: Secondary | ICD-10-CM | POA: Diagnosis not present

## 2023-06-10 DIAGNOSIS — S62102D Fracture of unspecified carpal bone, left wrist, subsequent encounter for fracture with routine healing: Secondary | ICD-10-CM | POA: Diagnosis not present

## 2023-06-10 DIAGNOSIS — I509 Heart failure, unspecified: Secondary | ICD-10-CM | POA: Diagnosis not present

## 2023-06-10 DIAGNOSIS — N1832 Chronic kidney disease, stage 3b: Secondary | ICD-10-CM | POA: Diagnosis not present

## 2023-06-10 DIAGNOSIS — Z96612 Presence of left artificial shoulder joint: Secondary | ICD-10-CM | POA: Diagnosis not present

## 2023-06-10 DIAGNOSIS — I130 Hypertensive heart and chronic kidney disease with heart failure and stage 1 through stage 4 chronic kidney disease, or unspecified chronic kidney disease: Secondary | ICD-10-CM | POA: Diagnosis not present

## 2023-06-11 DIAGNOSIS — Z96612 Presence of left artificial shoulder joint: Secondary | ICD-10-CM | POA: Diagnosis not present

## 2023-06-11 DIAGNOSIS — N1832 Chronic kidney disease, stage 3b: Secondary | ICD-10-CM | POA: Diagnosis not present

## 2023-06-11 DIAGNOSIS — S62102D Fracture of unspecified carpal bone, left wrist, subsequent encounter for fracture with routine healing: Secondary | ICD-10-CM | POA: Diagnosis not present

## 2023-06-11 DIAGNOSIS — S4292XD Fracture of left shoulder girdle, part unspecified, subsequent encounter for fracture with routine healing: Secondary | ICD-10-CM | POA: Diagnosis not present

## 2023-06-11 DIAGNOSIS — I130 Hypertensive heart and chronic kidney disease with heart failure and stage 1 through stage 4 chronic kidney disease, or unspecified chronic kidney disease: Secondary | ICD-10-CM | POA: Diagnosis not present

## 2023-06-11 DIAGNOSIS — I509 Heart failure, unspecified: Secondary | ICD-10-CM | POA: Diagnosis not present

## 2023-06-12 DIAGNOSIS — N1832 Chronic kidney disease, stage 3b: Secondary | ICD-10-CM | POA: Diagnosis not present

## 2023-06-12 DIAGNOSIS — I130 Hypertensive heart and chronic kidney disease with heart failure and stage 1 through stage 4 chronic kidney disease, or unspecified chronic kidney disease: Secondary | ICD-10-CM | POA: Diagnosis not present

## 2023-06-12 DIAGNOSIS — S4292XD Fracture of left shoulder girdle, part unspecified, subsequent encounter for fracture with routine healing: Secondary | ICD-10-CM | POA: Diagnosis not present

## 2023-06-12 DIAGNOSIS — Z96612 Presence of left artificial shoulder joint: Secondary | ICD-10-CM | POA: Diagnosis not present

## 2023-06-12 DIAGNOSIS — S62102D Fracture of unspecified carpal bone, left wrist, subsequent encounter for fracture with routine healing: Secondary | ICD-10-CM | POA: Diagnosis not present

## 2023-06-12 DIAGNOSIS — I509 Heart failure, unspecified: Secondary | ICD-10-CM | POA: Diagnosis not present

## 2023-06-15 ENCOUNTER — Other Ambulatory Visit: Payer: Self-pay | Admitting: Cardiovascular Disease

## 2023-06-15 DIAGNOSIS — S4292XD Fracture of left shoulder girdle, part unspecified, subsequent encounter for fracture with routine healing: Secondary | ICD-10-CM | POA: Diagnosis not present

## 2023-06-15 DIAGNOSIS — N1832 Chronic kidney disease, stage 3b: Secondary | ICD-10-CM | POA: Diagnosis not present

## 2023-06-15 DIAGNOSIS — I130 Hypertensive heart and chronic kidney disease with heart failure and stage 1 through stage 4 chronic kidney disease, or unspecified chronic kidney disease: Secondary | ICD-10-CM | POA: Diagnosis not present

## 2023-06-15 DIAGNOSIS — Z96612 Presence of left artificial shoulder joint: Secondary | ICD-10-CM | POA: Diagnosis not present

## 2023-06-15 DIAGNOSIS — I509 Heart failure, unspecified: Secondary | ICD-10-CM | POA: Diagnosis not present

## 2023-06-15 DIAGNOSIS — S62102D Fracture of unspecified carpal bone, left wrist, subsequent encounter for fracture with routine healing: Secondary | ICD-10-CM | POA: Diagnosis not present

## 2023-06-16 DIAGNOSIS — N1832 Chronic kidney disease, stage 3b: Secondary | ICD-10-CM | POA: Diagnosis not present

## 2023-06-16 DIAGNOSIS — I509 Heart failure, unspecified: Secondary | ICD-10-CM | POA: Diagnosis not present

## 2023-06-16 DIAGNOSIS — I130 Hypertensive heart and chronic kidney disease with heart failure and stage 1 through stage 4 chronic kidney disease, or unspecified chronic kidney disease: Secondary | ICD-10-CM | POA: Diagnosis not present

## 2023-06-16 DIAGNOSIS — S4292XD Fracture of left shoulder girdle, part unspecified, subsequent encounter for fracture with routine healing: Secondary | ICD-10-CM | POA: Diagnosis not present

## 2023-06-16 DIAGNOSIS — Z96612 Presence of left artificial shoulder joint: Secondary | ICD-10-CM | POA: Diagnosis not present

## 2023-06-16 DIAGNOSIS — S62102D Fracture of unspecified carpal bone, left wrist, subsequent encounter for fracture with routine healing: Secondary | ICD-10-CM | POA: Diagnosis not present

## 2023-06-18 DIAGNOSIS — I509 Heart failure, unspecified: Secondary | ICD-10-CM | POA: Diagnosis not present

## 2023-06-18 DIAGNOSIS — Z96612 Presence of left artificial shoulder joint: Secondary | ICD-10-CM | POA: Diagnosis not present

## 2023-06-18 DIAGNOSIS — I130 Hypertensive heart and chronic kidney disease with heart failure and stage 1 through stage 4 chronic kidney disease, or unspecified chronic kidney disease: Secondary | ICD-10-CM | POA: Diagnosis not present

## 2023-06-18 DIAGNOSIS — S4292XD Fracture of left shoulder girdle, part unspecified, subsequent encounter for fracture with routine healing: Secondary | ICD-10-CM | POA: Diagnosis not present

## 2023-06-18 DIAGNOSIS — S62102D Fracture of unspecified carpal bone, left wrist, subsequent encounter for fracture with routine healing: Secondary | ICD-10-CM | POA: Diagnosis not present

## 2023-06-18 DIAGNOSIS — N1832 Chronic kidney disease, stage 3b: Secondary | ICD-10-CM | POA: Diagnosis not present

## 2023-06-24 DIAGNOSIS — I4891 Unspecified atrial fibrillation: Secondary | ICD-10-CM | POA: Diagnosis not present

## 2023-06-24 DIAGNOSIS — I509 Heart failure, unspecified: Secondary | ICD-10-CM | POA: Diagnosis not present

## 2023-06-24 DIAGNOSIS — Z96612 Presence of left artificial shoulder joint: Secondary | ICD-10-CM | POA: Diagnosis not present

## 2023-06-24 DIAGNOSIS — H04123 Dry eye syndrome of bilateral lacrimal glands: Secondary | ICD-10-CM | POA: Diagnosis not present

## 2023-06-24 DIAGNOSIS — S4292XD Fracture of left shoulder girdle, part unspecified, subsequent encounter for fracture with routine healing: Secondary | ICD-10-CM | POA: Diagnosis not present

## 2023-06-24 DIAGNOSIS — I130 Hypertensive heart and chronic kidney disease with heart failure and stage 1 through stage 4 chronic kidney disease, or unspecified chronic kidney disease: Secondary | ICD-10-CM | POA: Diagnosis not present

## 2023-06-24 DIAGNOSIS — E559 Vitamin D deficiency, unspecified: Secondary | ICD-10-CM | POA: Diagnosis not present

## 2023-06-24 DIAGNOSIS — E785 Hyperlipidemia, unspecified: Secondary | ICD-10-CM | POA: Diagnosis not present

## 2023-06-24 DIAGNOSIS — D631 Anemia in chronic kidney disease: Secondary | ICD-10-CM | POA: Diagnosis not present

## 2023-06-24 DIAGNOSIS — E1122 Type 2 diabetes mellitus with diabetic chronic kidney disease: Secondary | ICD-10-CM | POA: Diagnosis not present

## 2023-06-24 DIAGNOSIS — I2510 Atherosclerotic heart disease of native coronary artery without angina pectoris: Secondary | ICD-10-CM | POA: Diagnosis not present

## 2023-06-24 DIAGNOSIS — S62102D Fracture of unspecified carpal bone, left wrist, subsequent encounter for fracture with routine healing: Secondary | ICD-10-CM | POA: Diagnosis not present

## 2023-06-24 DIAGNOSIS — Z9181 History of falling: Secondary | ICD-10-CM | POA: Diagnosis not present

## 2023-06-24 DIAGNOSIS — K219 Gastro-esophageal reflux disease without esophagitis: Secondary | ICD-10-CM | POA: Diagnosis not present

## 2023-06-24 DIAGNOSIS — Z7984 Long term (current) use of oral hypoglycemic drugs: Secondary | ICD-10-CM | POA: Diagnosis not present

## 2023-06-24 DIAGNOSIS — Z7901 Long term (current) use of anticoagulants: Secondary | ICD-10-CM | POA: Diagnosis not present

## 2023-06-24 DIAGNOSIS — N1832 Chronic kidney disease, stage 3b: Secondary | ICD-10-CM | POA: Diagnosis not present

## 2023-06-25 DIAGNOSIS — I130 Hypertensive heart and chronic kidney disease with heart failure and stage 1 through stage 4 chronic kidney disease, or unspecified chronic kidney disease: Secondary | ICD-10-CM | POA: Diagnosis not present

## 2023-06-25 DIAGNOSIS — S62102D Fracture of unspecified carpal bone, left wrist, subsequent encounter for fracture with routine healing: Secondary | ICD-10-CM | POA: Diagnosis not present

## 2023-06-25 DIAGNOSIS — I509 Heart failure, unspecified: Secondary | ICD-10-CM | POA: Diagnosis not present

## 2023-06-25 DIAGNOSIS — N1832 Chronic kidney disease, stage 3b: Secondary | ICD-10-CM | POA: Diagnosis not present

## 2023-06-25 DIAGNOSIS — Z96612 Presence of left artificial shoulder joint: Secondary | ICD-10-CM | POA: Diagnosis not present

## 2023-06-25 DIAGNOSIS — S4292XD Fracture of left shoulder girdle, part unspecified, subsequent encounter for fracture with routine healing: Secondary | ICD-10-CM | POA: Diagnosis not present

## 2023-06-26 DIAGNOSIS — S62102D Fracture of unspecified carpal bone, left wrist, subsequent encounter for fracture with routine healing: Secondary | ICD-10-CM | POA: Diagnosis not present

## 2023-06-26 DIAGNOSIS — S4292XD Fracture of left shoulder girdle, part unspecified, subsequent encounter for fracture with routine healing: Secondary | ICD-10-CM | POA: Diagnosis not present

## 2023-06-26 DIAGNOSIS — Z96612 Presence of left artificial shoulder joint: Secondary | ICD-10-CM | POA: Diagnosis not present

## 2023-06-26 DIAGNOSIS — I509 Heart failure, unspecified: Secondary | ICD-10-CM | POA: Diagnosis not present

## 2023-06-26 DIAGNOSIS — N1832 Chronic kidney disease, stage 3b: Secondary | ICD-10-CM | POA: Diagnosis not present

## 2023-06-26 DIAGNOSIS — I130 Hypertensive heart and chronic kidney disease with heart failure and stage 1 through stage 4 chronic kidney disease, or unspecified chronic kidney disease: Secondary | ICD-10-CM | POA: Diagnosis not present

## 2023-06-29 DIAGNOSIS — S4292XD Fracture of left shoulder girdle, part unspecified, subsequent encounter for fracture with routine healing: Secondary | ICD-10-CM | POA: Diagnosis not present

## 2023-06-29 DIAGNOSIS — N1832 Chronic kidney disease, stage 3b: Secondary | ICD-10-CM | POA: Diagnosis not present

## 2023-06-29 DIAGNOSIS — I130 Hypertensive heart and chronic kidney disease with heart failure and stage 1 through stage 4 chronic kidney disease, or unspecified chronic kidney disease: Secondary | ICD-10-CM | POA: Diagnosis not present

## 2023-06-29 DIAGNOSIS — Z96612 Presence of left artificial shoulder joint: Secondary | ICD-10-CM | POA: Diagnosis not present

## 2023-06-29 DIAGNOSIS — I509 Heart failure, unspecified: Secondary | ICD-10-CM | POA: Diagnosis not present

## 2023-06-29 DIAGNOSIS — S62102D Fracture of unspecified carpal bone, left wrist, subsequent encounter for fracture with routine healing: Secondary | ICD-10-CM | POA: Diagnosis not present

## 2023-06-30 DIAGNOSIS — S4292XD Fracture of left shoulder girdle, part unspecified, subsequent encounter for fracture with routine healing: Secondary | ICD-10-CM | POA: Diagnosis not present

## 2023-06-30 DIAGNOSIS — H04123 Dry eye syndrome of bilateral lacrimal glands: Secondary | ICD-10-CM | POA: Diagnosis not present

## 2023-06-30 DIAGNOSIS — I2510 Atherosclerotic heart disease of native coronary artery without angina pectoris: Secondary | ICD-10-CM | POA: Diagnosis not present

## 2023-06-30 DIAGNOSIS — I4891 Unspecified atrial fibrillation: Secondary | ICD-10-CM | POA: Diagnosis not present

## 2023-06-30 DIAGNOSIS — Z96612 Presence of left artificial shoulder joint: Secondary | ICD-10-CM | POA: Diagnosis not present

## 2023-06-30 DIAGNOSIS — S62102D Fracture of unspecified carpal bone, left wrist, subsequent encounter for fracture with routine healing: Secondary | ICD-10-CM | POA: Diagnosis not present

## 2023-06-30 DIAGNOSIS — Z7901 Long term (current) use of anticoagulants: Secondary | ICD-10-CM | POA: Diagnosis not present

## 2023-06-30 DIAGNOSIS — E1122 Type 2 diabetes mellitus with diabetic chronic kidney disease: Secondary | ICD-10-CM | POA: Diagnosis not present

## 2023-06-30 DIAGNOSIS — I509 Heart failure, unspecified: Secondary | ICD-10-CM | POA: Diagnosis not present

## 2023-06-30 DIAGNOSIS — I130 Hypertensive heart and chronic kidney disease with heart failure and stage 1 through stage 4 chronic kidney disease, or unspecified chronic kidney disease: Secondary | ICD-10-CM | POA: Diagnosis not present

## 2023-06-30 DIAGNOSIS — D631 Anemia in chronic kidney disease: Secondary | ICD-10-CM | POA: Diagnosis not present

## 2023-06-30 DIAGNOSIS — N1832 Chronic kidney disease, stage 3b: Secondary | ICD-10-CM | POA: Diagnosis not present

## 2023-07-06 DIAGNOSIS — S62102D Fracture of unspecified carpal bone, left wrist, subsequent encounter for fracture with routine healing: Secondary | ICD-10-CM | POA: Diagnosis not present

## 2023-07-06 DIAGNOSIS — N1832 Chronic kidney disease, stage 3b: Secondary | ICD-10-CM | POA: Diagnosis not present

## 2023-07-06 DIAGNOSIS — S4292XD Fracture of left shoulder girdle, part unspecified, subsequent encounter for fracture with routine healing: Secondary | ICD-10-CM | POA: Diagnosis not present

## 2023-07-06 DIAGNOSIS — I130 Hypertensive heart and chronic kidney disease with heart failure and stage 1 through stage 4 chronic kidney disease, or unspecified chronic kidney disease: Secondary | ICD-10-CM | POA: Diagnosis not present

## 2023-07-06 DIAGNOSIS — Z96612 Presence of left artificial shoulder joint: Secondary | ICD-10-CM | POA: Diagnosis not present

## 2023-07-06 DIAGNOSIS — I509 Heart failure, unspecified: Secondary | ICD-10-CM | POA: Diagnosis not present

## 2023-07-07 DIAGNOSIS — Z96612 Presence of left artificial shoulder joint: Secondary | ICD-10-CM | POA: Diagnosis not present

## 2023-07-07 DIAGNOSIS — Z471 Aftercare following joint replacement surgery: Secondary | ICD-10-CM | POA: Diagnosis not present

## 2023-07-14 DIAGNOSIS — Z96612 Presence of left artificial shoulder joint: Secondary | ICD-10-CM | POA: Diagnosis not present

## 2023-07-14 DIAGNOSIS — N1832 Chronic kidney disease, stage 3b: Secondary | ICD-10-CM | POA: Diagnosis not present

## 2023-07-14 DIAGNOSIS — S4292XD Fracture of left shoulder girdle, part unspecified, subsequent encounter for fracture with routine healing: Secondary | ICD-10-CM | POA: Diagnosis not present

## 2023-07-14 DIAGNOSIS — I130 Hypertensive heart and chronic kidney disease with heart failure and stage 1 through stage 4 chronic kidney disease, or unspecified chronic kidney disease: Secondary | ICD-10-CM | POA: Diagnosis not present

## 2023-07-14 DIAGNOSIS — I509 Heart failure, unspecified: Secondary | ICD-10-CM | POA: Diagnosis not present

## 2023-07-14 DIAGNOSIS — S62102D Fracture of unspecified carpal bone, left wrist, subsequent encounter for fracture with routine healing: Secondary | ICD-10-CM | POA: Diagnosis not present

## 2023-07-20 DIAGNOSIS — I509 Heart failure, unspecified: Secondary | ICD-10-CM | POA: Diagnosis not present

## 2023-07-20 DIAGNOSIS — I130 Hypertensive heart and chronic kidney disease with heart failure and stage 1 through stage 4 chronic kidney disease, or unspecified chronic kidney disease: Secondary | ICD-10-CM | POA: Diagnosis not present

## 2023-07-20 DIAGNOSIS — S4292XD Fracture of left shoulder girdle, part unspecified, subsequent encounter for fracture with routine healing: Secondary | ICD-10-CM | POA: Diagnosis not present

## 2023-07-20 DIAGNOSIS — S62102D Fracture of unspecified carpal bone, left wrist, subsequent encounter for fracture with routine healing: Secondary | ICD-10-CM | POA: Diagnosis not present

## 2023-07-20 DIAGNOSIS — Z96612 Presence of left artificial shoulder joint: Secondary | ICD-10-CM | POA: Diagnosis not present

## 2023-07-20 DIAGNOSIS — N1832 Chronic kidney disease, stage 3b: Secondary | ICD-10-CM | POA: Diagnosis not present

## 2023-07-29 DIAGNOSIS — I129 Hypertensive chronic kidney disease with stage 1 through stage 4 chronic kidney disease, or unspecified chronic kidney disease: Secondary | ICD-10-CM | POA: Diagnosis not present

## 2023-07-29 DIAGNOSIS — I25118 Atherosclerotic heart disease of native coronary artery with other forms of angina pectoris: Secondary | ICD-10-CM | POA: Diagnosis not present

## 2023-07-29 DIAGNOSIS — I480 Paroxysmal atrial fibrillation: Secondary | ICD-10-CM | POA: Diagnosis not present

## 2023-07-29 DIAGNOSIS — N1832 Chronic kidney disease, stage 3b: Secondary | ICD-10-CM | POA: Diagnosis not present

## 2023-07-29 DIAGNOSIS — R600 Localized edema: Secondary | ICD-10-CM | POA: Diagnosis not present

## 2023-07-29 DIAGNOSIS — E1129 Type 2 diabetes mellitus with other diabetic kidney complication: Secondary | ICD-10-CM | POA: Diagnosis not present

## 2023-07-29 DIAGNOSIS — I340 Nonrheumatic mitral (valve) insufficiency: Secondary | ICD-10-CM | POA: Diagnosis not present

## 2023-09-08 ENCOUNTER — Emergency Department (HOSPITAL_COMMUNITY)
Admission: EM | Admit: 2023-09-08 | Discharge: 2023-09-08 | Disposition: A | Discharge status: Home / Self Care | Attending: Emergency Medicine | Admitting: Emergency Medicine

## 2023-09-08 ENCOUNTER — Other Ambulatory Visit: Payer: Self-pay

## 2023-09-08 ENCOUNTER — Emergency Department (HOSPITAL_COMMUNITY)

## 2023-09-08 DIAGNOSIS — S0990XA Unspecified injury of head, initial encounter: Secondary | ICD-10-CM | POA: Insufficient documentation

## 2023-09-08 DIAGNOSIS — Z79899 Other long term (current) drug therapy: Secondary | ICD-10-CM | POA: Diagnosis not present

## 2023-09-08 DIAGNOSIS — M4802 Spinal stenosis, cervical region: Secondary | ICD-10-CM | POA: Diagnosis not present

## 2023-09-08 DIAGNOSIS — W19XXXA Unspecified fall, initial encounter: Secondary | ICD-10-CM | POA: Diagnosis not present

## 2023-09-08 DIAGNOSIS — W01198A Fall on same level from slipping, tripping and stumbling with subsequent striking against other object, initial encounter: Secondary | ICD-10-CM | POA: Diagnosis not present

## 2023-09-08 DIAGNOSIS — M4312 Spondylolisthesis, cervical region: Secondary | ICD-10-CM | POA: Diagnosis not present

## 2023-09-08 DIAGNOSIS — I6782 Cerebral ischemia: Secondary | ICD-10-CM | POA: Diagnosis not present

## 2023-09-08 DIAGNOSIS — S61411A Laceration without foreign body of right hand, initial encounter: Secondary | ICD-10-CM | POA: Diagnosis not present

## 2023-09-08 DIAGNOSIS — Z7901 Long term (current) use of anticoagulants: Secondary | ICD-10-CM | POA: Diagnosis not present

## 2023-09-08 DIAGNOSIS — S0991XA Unspecified injury of ear, initial encounter: Secondary | ICD-10-CM | POA: Diagnosis present

## 2023-09-08 DIAGNOSIS — S199XXA Unspecified injury of neck, initial encounter: Secondary | ICD-10-CM | POA: Diagnosis not present

## 2023-09-08 DIAGNOSIS — E119 Type 2 diabetes mellitus without complications: Secondary | ICD-10-CM | POA: Diagnosis not present

## 2023-09-08 DIAGNOSIS — I5043 Acute on chronic combined systolic (congestive) and diastolic (congestive) heart failure: Secondary | ICD-10-CM | POA: Diagnosis not present

## 2023-09-08 DIAGNOSIS — Z7984 Long term (current) use of oral hypoglycemic drugs: Secondary | ICD-10-CM | POA: Diagnosis not present

## 2023-09-08 DIAGNOSIS — S01311A Laceration without foreign body of right ear, initial encounter: Secondary | ICD-10-CM | POA: Diagnosis not present

## 2023-09-08 DIAGNOSIS — I672 Cerebral atherosclerosis: Secondary | ICD-10-CM | POA: Diagnosis not present

## 2023-09-08 DIAGNOSIS — Y92009 Unspecified place in unspecified non-institutional (private) residence as the place of occurrence of the external cause: Secondary | ICD-10-CM | POA: Insufficient documentation

## 2023-09-08 DIAGNOSIS — Z794 Long term (current) use of insulin: Secondary | ICD-10-CM | POA: Insufficient documentation

## 2023-09-08 DIAGNOSIS — R58 Hemorrhage, not elsewhere classified: Secondary | ICD-10-CM | POA: Diagnosis not present

## 2023-09-08 DIAGNOSIS — M47812 Spondylosis without myelopathy or radiculopathy, cervical region: Secondary | ICD-10-CM | POA: Diagnosis not present

## 2023-09-08 DIAGNOSIS — S0993XA Unspecified injury of face, initial encounter: Secondary | ICD-10-CM | POA: Diagnosis not present

## 2023-09-08 DIAGNOSIS — I110 Hypertensive heart disease with heart failure: Secondary | ICD-10-CM | POA: Insufficient documentation

## 2023-09-08 MED ORDER — TETANUS-DIPHTH-ACELL PERTUSSIS 5-2.5-18.5 LF-MCG/0.5 IM SUSY
0.5000 mL | PREFILLED_SYRINGE | Freq: Once | INTRAMUSCULAR | Status: DC
  Filled 2023-09-08: qty 0.5

## 2023-09-08 MED ORDER — LIDOCAINE HCL (PF) 1 % IJ SOLN
5.0000 mL | Freq: Once | INTRAMUSCULAR | Status: DC
  Filled 2023-09-08: qty 5

## 2023-09-08 NOTE — ED Provider Notes (Signed)
.  Laceration Repair  Date/Time: 09/08/2023 6:26 PM  Performed by: Rosan Sherlean DEL, PA-C Authorized by: Rosan Sherlean DEL, PA-C   Consent:    Consent obtained:  Verbal   Consent given by:  Patient   Risks, benefits, and alternatives were discussed: yes     Risks discussed:  Infection, pain, poor cosmetic result, need for additional repair, nerve damage and poor wound healing   Alternatives discussed:  No treatment Universal protocol:    Procedure explained and questions answered to patient or proxy's satisfaction: yes     Patient identity confirmed:  Verbally with patient Anesthesia:    Anesthesia method:  Local infiltration   Local anesthetic:  Lidocaine  1% w/o epi Laceration details:    Location:  Ear   Ear location:  R ear   Length (cm):  5 Treatment:    Area cleansed with:  Saline and Shur-Clens   Amount of cleaning:  Standard Skin repair:    Repair method:  Sutures   Suture size:  6-0   Suture material:  Chromic gut   Suture technique:  Simple interrupted   Number of sutures:  5 Approximation:    Approximation:  Close Repair type:    Repair type:  Simple Post-procedure details:    Dressing:  Open (no dressing)   Procedure completion:  Tolerated  Laceration of right ear repaired as described above, please see the other provider note for complete assessment of this patient.   Rosan Sherlean DEL DEVONNA 09/08/23 1827    Patsey Lot, MD 09/08/23 2317

## 2023-09-08 NOTE — ED Notes (Signed)
 BIB [facility] From home, hit head on lamp, right ear laceration. Skin tear on hand. No bleed. No LOC. On Eliquis  for Afib C-collar in  188/70 80 HR 97 RA

## 2023-09-08 NOTE — Discharge Instructions (Signed)
 Absorbable stitches were used, however they can come out in about a week.

## 2023-09-08 NOTE — ED Notes (Signed)
 Patient transported to CT

## 2023-09-08 NOTE — Progress Notes (Signed)
   09/08/23 1630  Spiritual Encounters  Type of Visit Initial  Care provided to: Patient  Referral source Trauma page  Reason for visit Trauma  OnCall Visit Yes  Interventions  Spiritual Care Interventions Made Established relationship of care and support;Compassionate presence;Reflective listening;Normalization of emotions;Prayer;Encouragement  Intervention Outcomes  Outcomes Connection to spiritual care;Awareness of support;Connection to values and goals of care;Awareness of health  Spiritual Care Plan  Spiritual Care Issues Still Outstanding No further spiritual care needs at this time (see row info)  Advance Directives (For Healthcare)  Does Patient Have a Medical Advance Directive? Yes   Chaplain responded to level 2 trauma page.  Patient is awaiting daughter to visit.  Chaplain visited with patient providing words of encouragement, compassion, empathy and prayer as requested.  Chaplain spiritual support services remain available as the need arises.

## 2023-09-08 NOTE — ED Provider Notes (Signed)
 [place] EMERGENCY DEPARTMENT AT [facility] Provider Note   CSN: [id number] Arrival date & time: 09/08/23  1619     Patient presents with: [place]   Allison Duffy is a 89 y.o. female.    Fall  Patient presents after mechanical fall.  Lost her balance fell onto her knees and then hit her head on the right side.  Did have some bleeding from the nose and from the right external ear.  No loss conscious.  However came in as a level 2 trauma since she is on anticoagulation.    Past Medical History:  Diagnosis Date   Acute on chronic combined systolic and diastolic CHF (congestive heart failure) (HCC) 01/28/2019   Arthritis    scattered joints (07/29/2013)   Chest pain    Colitis    Gout attack ?1980's   Hyperlipidemia    Hypertension    PONV (postoperative nausea and vomiting)    Type II diabetes mellitus (HCC)     Prior to Admission medications   Medication Sig Start Date End Date Taking? Authorizing Provider  acetaminophen  (TYLENOL ) 325 MG tablet Take 2 tablets (650 mg total) by mouth every 6 (six) hours as needed for mild pain (pain score 1-3) or moderate pain (pain score 4-6). 03/30/23   Mdala-Gausi, Masiku Agatha, MD  amLODipine  (NORVASC ) 10 MG tablet Take 10 mg by mouth at bedtime.    [provider]  apixaban  (ELIQUIS ) 2.5 MG TABS tablet Take 2.5 mg by mouth 2 (two) times daily.    [provider]  carboxymethylcellulose 1 % ophthalmic solution Place 1 drop into both eyes 2 (two) times daily.    [provider]  cholecalciferol (VITAMIN D ) 1000 UNITS tablet Take 1,000 Units by mouth daily with lunch.    [provider]  cholestyramine  (QUESTRAN ) 4 g packet Take 4 g by mouth daily as needed (IBS).    [provider]  cloNIDine  (CATAPRES ) 0.1 MG tablet TAKE 2 TABLETS BY MOUTH EVERY MORNING AND TAKE 1 TABLET BY MOUTH EVERY NIGHT AT BEDTIME 12/29/22   Court Dorn PARAS, MD  furosemide  (LASIX ) 20 MG tablet Take 1 tablet (20  mg total) by mouth daily. 08/27/22 03/23/23  Emelia Josefa HERO, NP  hydrALAZINE  (APRESOLINE ) 25 MG tablet TAKE 1 1/2 TABLETS(37.5 MG) THREE TIMES DAILY Patient taking differently: TAKE 1 1/2 TABLETS(37.5 MG) THREE TIMES DAILY with 1 (20mg ) tablet of isosorbide  11/11/22   Court Dorn PARAS, MD  HYDROmorphone  (DILAUDID ) 2 MG tablet Take 0.5 tablets (1 mg total) by mouth every 8 (eight) hours as needed for severe pain (pain score 7-10). 03/30/23   Mdala-Gausi, Masiku Agatha, MD  insulin  glargine (LANTUS ) 100 UNIT/ML injection Inject 0.2 mLs (20 Units total) into the skin daily. 03/31/23   Mdala-Gausi, Masiku Agatha, MD  isosorbide  dinitrate (ISORDIL ) 20 MG tablet TAKE 1 TABLET BY MOUTH THREE TIMES DAILY. TAKE WITH HYDRALAZINE  37.5MG (1 AND A 1/2 TABLETS) 11/11/22   Court Dorn PARAS, MD  metFORMIN  (GLUCOPHAGE ) 500 MG tablet Take 1 tablet (500 mg total) by mouth daily with breakfast. 03/30/23   Mdala-Gausi, Masiku Agatha, MD  Multiple Vitamin (MULTIVITAMIN WITH MINERALS) TABS Take 1 tablet by mouth daily with supper.    [provider]  nitroGLYCERIN  (NITROSTAT ) 0.4 MG SL tablet PLACE 1 TABLET (0.4 MG TOTAL) UNDER THE TONGUE EVERY FIVE MINUTES X 3 DOSES AS NEEDED FOR CHEST PAIN. 04/13/20 03/23/23  Anner Alm ORN, MD  omeprazole  (PRILOSEC) 40 MG capsule TAKE 1 CAPSULE BY MOUTH DAILY  WITH SUPPER 01/15/22   Court Dorn PARAS, MD  potassium chloride  (KLOR-CON ) 10 MEQ tablet TAKE 1 TABLET(10 MEQ) BY MOUTH DAILY-- MAY TAKE AN EXTRA WITH A WEIGHT INCREASE OF 2LBS OVERNIGHT OR 5 LBS TOTAL AS DIRECTED 11/11/22   Court Dorn PARAS, MD  Probiotic Product (PRO-BIOTIC BLEND) CAPS Take 1 capsule by mouth at bedtime. Takes OTC probiotic    Shepard Ade, MD  ranolazine  (RANEXA ) 500 MG 12 hr tablet TAKE 1 TABLET(500 MG) BY MOUTH TWICE DAILY 11/11/22   Court Dorn PARAS, MD  sitaGLIPtin (JANUVIA) 100 MG tablet Take 100 mg by mouth daily with lunch.    [provider]    Allergies: Codeine, Hydrocodone ,  Prednisone , and Sulfa antibiotics    Review of Systems  Updated Vital Signs BP (!) 162/84   Pulse 77   Temp 98.5 F (36.9 C)   Resp 19   Physical Exam Vitals and nursing note reviewed.  HENT:     Head: Normocephalic.     Ears:     Comments: Right TM normal but there is approximately a 1 cm of skin tear on the right external ear.  TM appears intact.      Nose:     Comments: Small amount of blood on the external nose on the right side but also small area of blood on the anterior right internal nose.  Minimal active bleeding. Cardiovascular:     Rate and Rhythm: Regular rhythm.  Pulmonary:     Breath sounds: No wheezing.  Abdominal:     Tenderness: There is no abdominal tenderness.  Musculoskeletal:     Cervical back: Neck supple. No tenderness.  Skin:    Comments: Small skin tear on dorsum of right hand without underlying bony tenderness.  Neurological:     Mental Status: She is alert and oriented to person, place, and time.     (all labs ordered are listed, but only abnormal results are displayed) Labs Reviewed - No data to display  EKG: None  Radiology: No results found.   Procedures   Medications Ordered in the ED - No data to display                                  Medical Decision Making Amount and/or Complexity of Data Reviewed Radiology: ordered.  Risk Prescription drug management.   Patient with fall.  Did hit head and had a skin tear on ear that will need repair.  Also with nose.  Will get head CT maxillofacial CT and cervical spine CT due to age and comorbidities.  No other apparent injury.  Head CT reassuring.  Wound closed by [employer].  Absorbable sutures used.  Patient stable for discharge home       Final diagnoses:  None    ED Discharge Orders     None          Patsey Lot, MD 09/08/23 2316

## 2023-09-08 NOTE — Progress Notes (Signed)
 Orthopedic Tech Progress Note Patient Details:  Allison Duffy 1926-08-26 [id number] Level 2 Trauma. Not needed Patient ID: Allison Duffy, female   DOB: Sep 25, 1926, 89 y.o.   MRN: [id number]  Allison Duffy Cos 09/08/2023, 4:50 PM

## 2023-09-23 DIAGNOSIS — I480 Paroxysmal atrial fibrillation: Secondary | ICD-10-CM | POA: Diagnosis not present

## 2023-09-23 DIAGNOSIS — E1129 Type 2 diabetes mellitus with other diabetic kidney complication: Secondary | ICD-10-CM | POA: Diagnosis not present

## 2023-09-23 DIAGNOSIS — E785 Hyperlipidemia, unspecified: Secondary | ICD-10-CM | POA: Diagnosis not present

## 2023-09-23 DIAGNOSIS — I130 Hypertensive heart and chronic kidney disease with heart failure and stage 1 through stage 4 chronic kidney disease, or unspecified chronic kidney disease: Secondary | ICD-10-CM | POA: Diagnosis not present

## 2023-09-23 DIAGNOSIS — N1832 Chronic kidney disease, stage 3b: Secondary | ICD-10-CM | POA: Diagnosis not present

## 2023-09-23 DIAGNOSIS — R269 Unspecified abnormalities of gait and mobility: Secondary | ICD-10-CM | POA: Diagnosis not present

## 2023-09-23 DIAGNOSIS — I5032 Chronic diastolic (congestive) heart failure: Secondary | ICD-10-CM | POA: Diagnosis not present

## 2023-09-23 DIAGNOSIS — Z7901 Long term (current) use of anticoagulants: Secondary | ICD-10-CM | POA: Diagnosis not present

## 2023-10-16 ENCOUNTER — Emergency Department (HOSPITAL_COMMUNITY)

## 2023-10-16 ENCOUNTER — Encounter (HOSPITAL_COMMUNITY): Payer: Self-pay

## 2023-10-16 ENCOUNTER — Inpatient Hospital Stay (HOSPITAL_COMMUNITY)
Admission: EM | Admit: 2023-10-16 | Discharge: 2023-10-19 | DRG: 069 | Disposition: A | Discharge status: Skilled Nursing Facility | Attending: Family Medicine | Admitting: Family Medicine

## 2023-10-16 DIAGNOSIS — I160 Hypertensive urgency: Secondary | ICD-10-CM | POA: Diagnosis present

## 2023-10-16 DIAGNOSIS — Z961 Presence of intraocular lens: Secondary | ICD-10-CM | POA: Diagnosis present

## 2023-10-16 DIAGNOSIS — Z882 Allergy status to sulfonamides status: Secondary | ICD-10-CM

## 2023-10-16 DIAGNOSIS — R29705 NIHSS score 5: Secondary | ICD-10-CM | POA: Diagnosis not present

## 2023-10-16 DIAGNOSIS — R29818 Other symptoms and signs involving the nervous system: Secondary | ICD-10-CM | POA: Diagnosis not present

## 2023-10-16 DIAGNOSIS — I130 Hypertensive heart and chronic kidney disease with heart failure and stage 1 through stage 4 chronic kidney disease, or unspecified chronic kidney disease: Secondary | ICD-10-CM | POA: Diagnosis present

## 2023-10-16 DIAGNOSIS — Z8249 Family history of ischemic heart disease and other diseases of the circulatory system: Secondary | ICD-10-CM | POA: Diagnosis not present

## 2023-10-16 DIAGNOSIS — E785 Hyperlipidemia, unspecified: Secondary | ICD-10-CM | POA: Diagnosis present

## 2023-10-16 DIAGNOSIS — E1165 Type 2 diabetes mellitus with hyperglycemia: Secondary | ICD-10-CM | POA: Diagnosis not present

## 2023-10-16 DIAGNOSIS — Z66 Do not resuscitate: Secondary | ICD-10-CM | POA: Diagnosis present

## 2023-10-16 DIAGNOSIS — H6122 Impacted cerumen, left ear: Secondary | ICD-10-CM | POA: Diagnosis present

## 2023-10-16 DIAGNOSIS — Z9842 Cataract extraction status, left eye: Secondary | ICD-10-CM

## 2023-10-16 DIAGNOSIS — Z96611 Presence of right artificial shoulder joint: Secondary | ICD-10-CM | POA: Diagnosis not present

## 2023-10-16 DIAGNOSIS — L89152 Pressure ulcer of sacral region, stage 2: Secondary | ICD-10-CM | POA: Diagnosis not present

## 2023-10-16 DIAGNOSIS — Z87891 Personal history of nicotine dependence: Secondary | ICD-10-CM | POA: Diagnosis not present

## 2023-10-16 DIAGNOSIS — E876 Hypokalemia: Secondary | ICD-10-CM | POA: Diagnosis present

## 2023-10-16 DIAGNOSIS — Z23 Encounter for immunization: Secondary | ICD-10-CM

## 2023-10-16 DIAGNOSIS — I6523 Occlusion and stenosis of bilateral carotid arteries: Secondary | ICD-10-CM | POA: Diagnosis not present

## 2023-10-16 DIAGNOSIS — R9089 Other abnormal findings on diagnostic imaging of central nervous system: Secondary | ICD-10-CM | POA: Diagnosis not present

## 2023-10-16 DIAGNOSIS — Z743 Need for continuous supervision: Secondary | ICD-10-CM | POA: Diagnosis not present

## 2023-10-16 DIAGNOSIS — Z7984 Long term (current) use of oral hypoglycemic drugs: Secondary | ICD-10-CM

## 2023-10-16 DIAGNOSIS — Z8673 Personal history of transient ischemic attack (TIA), and cerebral infarction without residual deficits: Secondary | ICD-10-CM | POA: Diagnosis not present

## 2023-10-16 DIAGNOSIS — I5042 Chronic combined systolic (congestive) and diastolic (congestive) heart failure: Secondary | ICD-10-CM | POA: Diagnosis not present

## 2023-10-16 DIAGNOSIS — Z96612 Presence of left artificial shoulder joint: Secondary | ICD-10-CM | POA: Diagnosis present

## 2023-10-16 DIAGNOSIS — I480 Paroxysmal atrial fibrillation: Secondary | ICD-10-CM | POA: Diagnosis present

## 2023-10-16 DIAGNOSIS — I639 Cerebral infarction, unspecified: Secondary | ICD-10-CM | POA: Diagnosis not present

## 2023-10-16 DIAGNOSIS — R0989 Other specified symptoms and signs involving the circulatory and respiratory systems: Secondary | ICD-10-CM | POA: Diagnosis not present

## 2023-10-16 DIAGNOSIS — Z9841 Cataract extraction status, right eye: Secondary | ICD-10-CM

## 2023-10-16 DIAGNOSIS — I5033 Acute on chronic diastolic (congestive) heart failure: Secondary | ICD-10-CM | POA: Diagnosis not present

## 2023-10-16 DIAGNOSIS — F419 Anxiety disorder, unspecified: Secondary | ICD-10-CM | POA: Diagnosis present

## 2023-10-16 DIAGNOSIS — Z7982 Long term (current) use of aspirin: Secondary | ICD-10-CM

## 2023-10-16 DIAGNOSIS — Z885 Allergy status to narcotic agent status: Secondary | ICD-10-CM

## 2023-10-16 DIAGNOSIS — I5032 Chronic diastolic (congestive) heart failure: Secondary | ICD-10-CM | POA: Diagnosis not present

## 2023-10-16 DIAGNOSIS — G459 Transient cerebral ischemic attack, unspecified: Secondary | ICD-10-CM | POA: Diagnosis not present

## 2023-10-16 DIAGNOSIS — Z833 Family history of diabetes mellitus: Secondary | ICD-10-CM

## 2023-10-16 DIAGNOSIS — N1832 Chronic kidney disease, stage 3b: Secondary | ICD-10-CM | POA: Diagnosis not present

## 2023-10-16 DIAGNOSIS — G450 Vertebro-basilar artery syndrome: Secondary | ICD-10-CM | POA: Diagnosis not present

## 2023-10-16 DIAGNOSIS — Z79899 Other long term (current) drug therapy: Secondary | ICD-10-CM

## 2023-10-16 DIAGNOSIS — I340 Nonrheumatic mitral (valve) insufficiency: Secondary | ICD-10-CM | POA: Diagnosis not present

## 2023-10-16 DIAGNOSIS — R4181 Age-related cognitive decline: Secondary | ICD-10-CM | POA: Diagnosis not present

## 2023-10-16 DIAGNOSIS — I272 Pulmonary hypertension, unspecified: Secondary | ICD-10-CM | POA: Diagnosis present

## 2023-10-16 DIAGNOSIS — Z9181 History of falling: Secondary | ICD-10-CM

## 2023-10-16 DIAGNOSIS — Z8781 Personal history of (healed) traumatic fracture: Secondary | ICD-10-CM | POA: Diagnosis not present

## 2023-10-16 DIAGNOSIS — K589 Irritable bowel syndrome without diarrhea: Secondary | ICD-10-CM | POA: Diagnosis not present

## 2023-10-16 DIAGNOSIS — H04123 Dry eye syndrome of bilateral lacrimal glands: Secondary | ICD-10-CM | POA: Diagnosis not present

## 2023-10-16 DIAGNOSIS — R296 Repeated falls: Secondary | ICD-10-CM | POA: Diagnosis present

## 2023-10-16 DIAGNOSIS — I6503 Occlusion and stenosis of bilateral vertebral arteries: Secondary | ICD-10-CM | POA: Diagnosis not present

## 2023-10-16 DIAGNOSIS — I2510 Atherosclerotic heart disease of native coronary artery without angina pectoris: Secondary | ICD-10-CM | POA: Diagnosis present

## 2023-10-16 DIAGNOSIS — N1831 Chronic kidney disease, stage 3a: Secondary | ICD-10-CM | POA: Diagnosis not present

## 2023-10-16 DIAGNOSIS — I6782 Cerebral ischemia: Secondary | ICD-10-CM | POA: Diagnosis not present

## 2023-10-16 DIAGNOSIS — I10 Essential (primary) hypertension: Secondary | ICD-10-CM | POA: Diagnosis not present

## 2023-10-16 DIAGNOSIS — K5900 Constipation, unspecified: Secondary | ICD-10-CM | POA: Diagnosis not present

## 2023-10-16 DIAGNOSIS — N179 Acute kidney failure, unspecified: Secondary | ICD-10-CM | POA: Diagnosis not present

## 2023-10-16 DIAGNOSIS — R4701 Aphasia: Secondary | ICD-10-CM | POA: Diagnosis not present

## 2023-10-16 DIAGNOSIS — E1122 Type 2 diabetes mellitus with diabetic chronic kidney disease: Secondary | ICD-10-CM | POA: Diagnosis present

## 2023-10-16 DIAGNOSIS — I3481 Nonrheumatic mitral (valve) annulus calcification: Secondary | ICD-10-CM | POA: Diagnosis not present

## 2023-10-16 DIAGNOSIS — R42 Dizziness and giddiness: Principal | ICD-10-CM

## 2023-10-16 DIAGNOSIS — R531 Weakness: Secondary | ICD-10-CM | POA: Diagnosis not present

## 2023-10-16 DIAGNOSIS — R110 Nausea: Secondary | ICD-10-CM | POA: Diagnosis not present

## 2023-10-16 LAB — DIFFERENTIAL
Abs Immature Granulocytes: 0.02 K/uL (ref 0.00–0.07)
Basophils Absolute: 0.1 K/uL (ref 0.0–0.1)
Basophils Relative: 1 %
Eosinophils Absolute: 0.1 K/uL (ref 0.0–0.5)
Eosinophils Relative: 1 %
Immature Granulocytes: 0 %
Lymphocytes Relative: 20 %
Lymphs Abs: 1.4 K/uL (ref 0.7–4.0)
Monocytes Absolute: 0.4 K/uL (ref 0.1–1.0)
Monocytes Relative: 6 %
Neutro Abs: 4.8 K/uL (ref 1.7–7.7)
Neutrophils Relative %: 72 %

## 2023-10-16 LAB — COMPREHENSIVE METABOLIC PANEL WITH GFR
ALT: 20 U/L (ref 0–44)
AST: 23 U/L (ref 15–41)
Albumin: 4.2 g/dL (ref 3.5–5.0)
Alkaline Phosphatase: 51 U/L (ref 38–126)
Anion gap: 13 (ref 5–15)
BUN: 27 mg/dL — ABNORMAL HIGH (ref 8–23)
CO2: 21 mmol/L — ABNORMAL LOW (ref 22–32)
Calcium: 9.3 mg/dL (ref 8.9–10.3)
Chloride: 104 mmol/L (ref 98–111)
Creatinine, Ser: 1.38 mg/dL — ABNORMAL HIGH (ref 0.44–1.00)
GFR, Estimated: 35 mL/min — ABNORMAL LOW (ref >60)
Glucose, Bld: 176 mg/dL — ABNORMAL HIGH (ref 70–99)
Potassium: 3.6 mmol/L (ref 3.5–5.1)
Sodium: 138 mmol/L (ref 135–145)
Total Bilirubin: 0.7 mg/dL (ref 0.0–1.2)
Total Protein: 7.5 g/dL (ref 6.5–8.1)

## 2023-10-16 LAB — I-STAT CHEM 8, ED
BUN: 29 mg/dL — ABNORMAL HIGH (ref 8–23)
Calcium, Ion: 1.09 mmol/L — ABNORMAL LOW (ref 1.15–1.40)
Chloride: 106 mmol/L (ref 98–111)
Creatinine, Ser: 1.4 mg/dL — ABNORMAL HIGH (ref 0.44–1.00)
Glucose, Bld: 185 mg/dL — ABNORMAL HIGH (ref 70–99)
HCT: 36 % (ref 36.0–46.0)
Hemoglobin: 12.2 g/dL (ref 12.0–15.0)
Potassium: 3.7 mmol/L (ref 3.5–5.1)
Sodium: 140 mmol/L (ref 135–145)
TCO2: 21 mmol/L — ABNORMAL LOW (ref 22–32)

## 2023-10-16 LAB — URINALYSIS, ROUTINE W REFLEX MICROSCOPIC
Bacteria, UA: NONE SEEN
Bilirubin Urine: NEGATIVE
Glucose, UA: NEGATIVE mg/dL
Hgb urine dipstick: NEGATIVE
Ketones, ur: NEGATIVE mg/dL
Leukocytes,Ua: NEGATIVE
Nitrite: NEGATIVE
Protein, ur: 30 mg/dL — AB
Specific Gravity, Urine: 1.018 (ref 1.005–1.030)
pH: 5 (ref 5.0–8.0)

## 2023-10-16 LAB — CBC
HCT: 36.9 % (ref 36.0–46.0)
Hemoglobin: 12.6 g/dL (ref 12.0–15.0)
MCH: 33.7 pg (ref 26.0–34.0)
MCHC: 34.1 g/dL (ref 30.0–36.0)
MCV: 98.7 fL (ref 80.0–100.0)
Platelets: 195 K/uL (ref 150–400)
RBC: 3.74 MIL/uL — ABNORMAL LOW (ref 3.87–5.11)
RDW: 11.7 % (ref 11.5–15.5)
WBC: 6.8 K/uL (ref 4.0–10.5)
nRBC: 0 % (ref 0.0–0.2)

## 2023-10-16 LAB — CBG MONITORING, ED: Glucose-Capillary: 159 mg/dL — ABNORMAL HIGH (ref 70–99)

## 2023-10-16 LAB — PROTIME-INR
INR: 1.1 (ref 0.8–1.2)
Prothrombin Time: 14.7 s (ref 11.4–15.2)

## 2023-10-16 LAB — APTT: aPTT: 29 s (ref 24–36)

## 2023-10-16 LAB — ETHANOL: Alcohol, Ethyl (B): <15 mg/dL (ref <15)

## 2023-10-16 MED ORDER — SODIUM CHLORIDE 0.9 % IV SOLN
INTRAVENOUS | Status: DC
Start: 2023-10-16 — End: 2023-10-16

## 2023-10-16 MED ORDER — INSULIN ASPART 100 UNIT/ML IJ SOLN
0.0000 [IU] | Freq: Three times a day (TID) | INTRAMUSCULAR | Status: DC
  Administered 2023-10-17 (×2): 3 [IU] via SUBCUTANEOUS
  Administered 2023-10-17: 2 [IU] via SUBCUTANEOUS
  Administered 2023-10-18: 3 [IU] via SUBCUTANEOUS
  Administered 2023-10-18: 5 [IU] via SUBCUTANEOUS
  Administered 2023-10-18: 3 [IU] via SUBCUTANEOUS
  Administered 2023-10-19: 2 [IU] via SUBCUTANEOUS
  Administered 2023-10-19: 8 [IU] via SUBCUTANEOUS
  Administered 2023-10-19: 3 [IU] via SUBCUTANEOUS

## 2023-10-16 MED ORDER — LORAZEPAM 2 MG/ML IJ SOLN
INTRAMUSCULAR | Status: AC
  Filled 2023-10-16: qty 1

## 2023-10-16 MED ORDER — MECLIZINE HCL 25 MG PO TABS
25.0000 mg | ORAL_TABLET | Freq: Once | ORAL | Status: AC
  Administered 2023-10-17: 25 mg via ORAL
  Filled 2023-10-16: qty 1

## 2023-10-16 MED ORDER — LACTATED RINGERS IV SOLN: INTRAVENOUS | Status: AC

## 2023-10-16 MED ORDER — ENOXAPARIN SODIUM 30 MG/0.3ML IJ SOSY
30.0000 mg | PREFILLED_SYRINGE | INTRAMUSCULAR | Status: DC
Start: 2023-10-17 — End: 2023-10-17
  Administered 2023-10-17: 30 mg via SUBCUTANEOUS
  Filled 2023-10-16: qty 0.3

## 2023-10-16 MED ORDER — ACETAMINOPHEN 325 MG PO TABS: 650.0000 mg | ORAL_TABLET | Freq: Four times a day (QID) | ORAL | Status: DC | PRN

## 2023-10-16 MED ORDER — ONDANSETRON HCL 4 MG PO TABS: 4.0000 mg | ORAL_TABLET | Freq: Four times a day (QID) | ORAL | Status: DC | PRN

## 2023-10-16 MED ORDER — LORAZEPAM 2 MG/ML IJ SOLN: 0.5000 mg | Freq: Once | INTRAMUSCULAR | Status: DC

## 2023-10-16 MED ORDER — ROSUVASTATIN CALCIUM 20 MG PO TABS
20.0000 mg | ORAL_TABLET | Freq: Every day | ORAL | Status: DC
Start: 2023-10-16 — End: 2023-10-18
  Administered 2023-10-16 – 2023-10-17 (×2): 20 mg via ORAL
  Filled 2023-10-16 (×2): qty 1

## 2023-10-16 MED ORDER — RANOLAZINE ER 500 MG PO TB12
500.0000 mg | ORAL_TABLET | Freq: Two times a day (BID) | ORAL | Status: DC
  Administered 2023-10-16 – 2023-10-19 (×6): 500 mg via ORAL
  Filled 2023-10-16 (×7): qty 1

## 2023-10-16 MED ORDER — LORAZEPAM 2 MG/ML IJ SOLN
1.0000 mg | Freq: Once | INTRAMUSCULAR | Status: AC
  Administered 2023-10-16: 1 mg via INTRAVENOUS

## 2023-10-16 MED ORDER — STROKE: EARLY STAGES OF RECOVERY BOOK
Freq: Once | Status: AC
  Filled 2023-10-16: qty 1

## 2023-10-16 MED ORDER — IOHEXOL 350 MG/ML SOLN
75.0000 mL | Freq: Once | INTRAVENOUS | Status: AC | PRN
  Administered 2023-10-16: 75 mL via INTRAVENOUS

## 2023-10-16 MED ORDER — BISACODYL 5 MG PO TBEC: 5.0000 mg | DELAYED_RELEASE_TABLET | Freq: Every day | ORAL | Status: DC | PRN

## 2023-10-16 MED ORDER — SODIUM CHLORIDE 0.9% FLUSH
3.0000 mL | Freq: Once | INTRAVENOUS | Status: AC
  Administered 2023-10-16: 3 mL via INTRAVENOUS

## 2023-10-16 MED ORDER — ONDANSETRON HCL 4 MG/2ML IJ SOLN: 4.0000 mg | Freq: Four times a day (QID) | INTRAMUSCULAR | Status: DC | PRN

## 2023-10-16 MED ORDER — ONDANSETRON HCL 4 MG/2ML IJ SOLN
INTRAMUSCULAR | Status: AC
  Administered 2023-10-16: 4 mg
  Filled 2023-10-16: qty 2

## 2023-10-16 MED ORDER — SENNOSIDES-DOCUSATE SODIUM 8.6-50 MG PO TABS
1.0000 | ORAL_TABLET | Freq: Every evening | ORAL | Status: DC | PRN
  Administered 2023-10-17: 1 via ORAL
  Filled 2023-10-16: qty 1

## 2023-10-16 MED ORDER — ASPIRIN 81 MG PO TBEC
81.0000 mg | DELAYED_RELEASE_TABLET | Freq: Every day | ORAL | Status: DC
Start: 2023-10-17 — End: 2023-10-17
  Administered 2023-10-17: 81 mg via ORAL
  Filled 2023-10-16: qty 1

## 2023-10-16 MED ORDER — LACTATED RINGERS IV BOLUS
1000.0000 mL | Freq: Once | INTRAVENOUS | Status: AC
  Administered 2023-10-16: 1000 mL via INTRAVENOUS

## 2023-10-16 MED ORDER — HYDRALAZINE HCL 20 MG/ML IJ SOLN: 10.0000 mg | Freq: Four times a day (QID) | INTRAMUSCULAR | Status: DC | PRN

## 2023-10-16 MED ORDER — DIAZEPAM 5 MG/ML IJ SOLN
5.0000 mg | Freq: Once | INTRAMUSCULAR | Status: AC
  Administered 2023-10-16: 5 mg via INTRAVENOUS
  Filled 2023-10-16: qty 2

## 2023-10-16 MED ORDER — INSULIN ASPART 100 UNIT/ML IJ SOLN
0.0000 [IU] | Freq: Every day | INTRAMUSCULAR | Status: DC
  Administered 2023-10-17: 2 [IU] via SUBCUTANEOUS

## 2023-10-16 MED ORDER — ACETAMINOPHEN 650 MG RE SUPP: 650.0000 mg | Freq: Four times a day (QID) | RECTAL | Status: DC | PRN

## 2023-10-16 NOTE — H&P (Addendum)
 History and Physical  Allison Duffy FMW:[id number] DOB: 19-Nov-1926 DOA: 10/16/2023  PCP: Shepard Ade, MD   Chief Complaint: Dizziness  HPI: Allison Duffy is a 89 y.o. female with medical history significant for A-fib recently discontinued anticoagulation, HTN, HLD, chronic diastolic HF, CKD 3B, arthritis, T2DM and CAD who presented to the ED for evaluation of dizziness. Patient reports that she went to the beauty shop and then to the grocery store with her daughter. When they got to the parking lot, she started feeling dizzy and very weak  felt like I was going to die. She was brought to the ED for further evaluation. At bedside, she denies any dizziness at rest but reports very dry mouth and feeling very weak. She denies any chest pain, shortness of breath, vision changes, abdominal pain, nausea, vomiting, dysuria, fevers or chills.  Per daughter, patient's speech seems slightly off.   ED Course: Initial vitals show patient afebrile, HR 60-70s, SBP 140-180s, SpO2 100% on room air. Initial labs significant for creatinine 1.38, glucose 185, normal CBC, UA with no signs of infection, negative ethanol levels and normal INR. EKG shows ectopic atrial rhythm. CXR shows no active disease.  CT head with no acute intracranial abnormality. CTA head and neck shows severe narrowing of the bilateral distal vertebral arteries and the basilar confluence and no LVO. MRI brain with no acute intracranial abnormality. Pt received IV Ativan  1 mg x 1, IV Valium  5 mg x 1.  Neurology was consulted for evaluation. [facility] was consulted for admission.   Review of Systems: Please see HPI for pertinent positives and negatives. A complete 10 system review of systems are otherwise negative.  Past Medical History:  Diagnosis Date   Acute on chronic combined systolic and diastolic CHF (congestive heart failure) (HCC) 01/28/2019   Arthritis    scattered joints (07/29/2013)   Chest pain    Colitis    Gout attack ?1980's    Hyperlipidemia    Hypertension    PONV (postoperative nausea and vomiting)    Type II diabetes mellitus (HCC)    Past Surgical History:  Procedure Laterality Date   APPENDECTOMY     CARPAL TUNNEL RELEASE Right    CATARACT EXTRACTION W/ INTRAOCULAR LENS  IMPLANT, BILATERAL Bilateral    CHOLECYSTECTOMY     KNEE ARTHROSCOPY Right    LEFT HEART CATH AND CORONARY ANGIOGRAPHY N/A 04/12/2020   Procedure: LEFT HEART CATH AND CORONARY ANGIOGRAPHY;  Surgeon: Court Dorn PARAS, MD;  Location: [facility];  Service: Cardiovascular;  Laterality: N/A;   OLECRANON BURSECTOMY Left 07/29/2013   in ER   PARATHYROIDECTOMY     REVERSE SHOULDER ARTHROPLASTY Left 03/27/2023   Procedure: REVERSE TOTAL  SHOULDER ARTHROPLASTY WITH CLOSED FIXATION OF DISTAL RADIUS;  Surgeon: Sharl Selinda Dover, MD;  Location: [facility] OR;  Service: Orthopedics;  Laterality: Left;   TONSILLECTOMY     TOTAL SHOULDER REPLACEMENT  2017   VAGINAL HYSTERECTOMY     Social History:  reports that she quit smoking about 70 years ago. Her smoking use included cigarettes. She started smoking about 75 years ago. She has never used smokeless tobacco. She reports that she does not drink alcohol  and does not use drugs.  Allergies  Allergen Reactions   Codeine Nausea And Vomiting   Hydrocodone  Nausea And Vomiting    Nausea and vomiting   Morphine  Sulfate Nausea And Vomiting   Prednisone      Other reaction(s): sick on her stomach   Sulfa Antibiotics Itching,  Nausea Only and Other (See Comments)    Family History  Problem Relation Age of Onset   Hypertension Father    Heart disease Father    Heart disease Mother    Diabetes Mother    Hypertension Mother    Heart disease Brother        both brothers   Diabetes Brother    Hypertension Brother      Prior to Admission medications   Medication Sig Start Date End Date Taking? Authorizing Provider  amLODipine  (NORVASC ) 10 MG tablet Take 10 mg by mouth at bedtime.   Yes [provider]  aspirin  EC 81 MG tablet Take 81 mg by mouth daily. Swallow whole.   Yes [provider]  carboxymethylcellulose 1 % ophthalmic solution Place 1 drop into both eyes 2 (two) times daily.   Yes [provider]  cholecalciferol (VITAMIN D ) 1000 UNITS tablet Take 1,000 Units by mouth daily with lunch.   Yes [provider]  cholestyramine  (QUESTRAN ) 4 g packet Take 4 g by mouth daily as needed (IBS).   Yes [provider]  cloNIDine  (CATAPRES ) 0.1 MG tablet TAKE 2 TABLETS BY MOUTH EVERY MORNING AND TAKE 1 TABLET BY MOUTH EVERY NIGHT AT BEDTIME 12/29/22  Yes Court Dorn PARAS, MD  furosemide  (LASIX ) 20 MG tablet Take 1 tablet (20 mg total) by mouth daily. Patient taking differently: Take 40 mg by mouth daily. 08/27/22 10/16/23 Yes Cleaver, Josefa HERO, NP  hydrALAZINE  (APRESOLINE ) 25 MG tablet TAKE 1 1/2 TABLETS(37.5 MG) THREE TIMES DAILY 11/11/22  Yes Court Dorn PARAS, MD  isosorbide  dinitrate (ISORDIL ) 20 MG tablet TAKE 1 TABLET BY MOUTH THREE TIMES DAILY. TAKE WITH HYDRALAZINE  37.5MG (1 AND A 1/2 TABLETS) 11/11/22  Yes Court Dorn PARAS, MD  metFORMIN  (GLUCOPHAGE ) 500 MG tablet Take 1 tablet (500 mg total) by mouth daily with breakfast. Patient taking differently: Take 500 mg by mouth in the morning and at bedtime. 03/30/23  Yes Mdala-Gausi, Masiku Agatha, MD  Multiple Vitamin (MULTIVITAMIN WITH MINERALS) TABS Take 1 tablet by mouth daily with supper.   Yes [provider]  nitroGLYCERIN  (NITROSTAT ) 0.4 MG SL tablet PLACE 1 TABLET (0.4 MG TOTAL) UNDER THE TONGUE EVERY FIVE MINUTES X 3 DOSES AS NEEDED FOR CHEST PAIN. 04/13/20 10/16/23 Yes Anner Alm ORN, MD  omeprazole  (PRILOSEC) 40 MG capsule TAKE 1 CAPSULE BY MOUTH DAILY WITH SUPPER 01/15/22  Yes Court Dorn PARAS, MD  potassium chloride  (KLOR-CON ) 10 MEQ tablet TAKE 1 TABLET(10 MEQ) BY MOUTH DAILY-- MAY TAKE AN EXTRA WITH A WEIGHT INCREASE OF 2LBS OVERNIGHT OR 5 LBS TOTAL AS DIRECTED Patient  taking differently: 20 mEq daily. 11/11/22  Yes Court Dorn PARAS, MD  Probiotic Product (PRO-BIOTIC BLEND) CAPS Take 1 capsule by mouth at bedtime. Takes OTC probiotic   Yes Shepard Ade, MD  ranolazine  (RANEXA ) 500 MG 12 hr tablet TAKE 1 TABLET(500 MG) BY MOUTH TWICE DAILY 11/11/22  Yes Court Dorn PARAS, MD  rosuvastatin  (CRESTOR ) 20 MG tablet Take 20 mg by mouth at bedtime.   Yes [provider]  sitaGLIPtin (JANUVIA) 100 MG tablet Take 100 mg by mouth daily with lunch.   Yes [provider]  apixaban  (ELIQUIS ) 2.5 MG TABS tablet Take 2.5 mg by mouth 2 (two) times daily. Patient not taking: Reported on 10/16/2023    [provider]    Physical Exam: BP (!) 162/70   Pulse 69   Temp 98.5 F (36.9 C) (Oral)   Resp 20  Ht 5' 7.2 (1.707 m)   Wt 65.8 kg   SpO2 97%   BMI 22.58 kg/m  General: Pleasant, weak appearing elderly woman laying in bed. No acute distress. HEENT: [place]/AT. Anicteric sclera. Dry mucous membrane. EOMI. CV: RRR. No murmurs, rubs, or gallops. No LE edema Pulmonary: Lungs CTAB. Normal effort. No wheezing or rales. Abdominal: Soft, nontender, nondistended. Normal bowel sounds. Extremities: Palpable radial and DP pulses. Normal ROM. Skin: Warm and dry. No obvious rash or lesions. Neuro: A&Ox3. Speech is clear and comprehensible.  No facial asymmetry. Overall global weakness with difficulty lifting lower extremities above gravity. Moves all extremities. Normal sensation to light touch.  Psych: Mildly anxious          Labs on Admission:  Basic Metabolic Panel: Recent Labs  Lab 10/16/23 1243 10/16/23 1305  NA 138 140  K 3.6 3.7  CL 104 106  CO2 21*  --   GLUCOSE 176* 185*  BUN 27* 29*  CREATININE 1.38* 1.40*  CALCIUM  9.3  --    Liver Function Tests: Recent Labs  Lab 10/16/23 1243  AST 23  ALT 20  ALKPHOS 51  BILITOT 0.7  PROT 7.5  ALBUMIN  4.2   No results for input(s): LIPASE, AMYLASE in the last 168 hours. No  results for input(s): AMMONIA in the last 168 hours. CBC: Recent Labs  Lab 10/16/23 1243 10/16/23 1305  WBC 6.8  --   NEUTROABS 4.8  --   HGB 12.6 12.2  HCT 36.9 36.0  MCV 98.7  --   PLT 195  --    Cardiac Enzymes: No results for input(s): CKTOTAL, CKMB, CKMBINDEX, TROPONINI in the last 168 hours. BNP (last 3 results) No results for input(s): BNP in the last 8760 hours.  ProBNP (last 3 results) No results for input(s): PROBNP in the last 8760 hours.  CBG: No results for input(s): GLUCAP in the last 168 hours.  Radiological Exams on Admission: DG Chest 2 View Result Date: 10/16/2023 CLINICAL DATA:  Dizziness EXAM: CHEST - 2 VIEW COMPARISON:  03/23/2023 FINDINGS: Frontal and lateral views of the chest demonstrate an unremarkable cardiac silhouette. Stable mitral annular calcifications and atherosclerosis of the aortic arch. Stable chronic pulmonary vascular congestion. No airspace disease, effusion, or pneumothorax. No acute bony abnormalities. Bilateral shoulder arthroplasties. IMPRESSION: 1. Chronic pulmonary vascular congestion without overt edema. Electronically Signed   By: Ozell Daring M.D.   On: 10/16/2023 15:21   MR BRAIN WO CONTRAST Result Date: 10/16/2023 EXAM: MRI BRAIN WITHOUT CONTRAST 10/16/2023 01:41:53 PM TECHNIQUE: Multiplanar multisequence MRI of the head/brain was performed without the administration of intravenous contrast. COMPARISON: CT head and CTA head and neck earlier same day. CLINICAL HISTORY: Neuro deficit, acute, stroke suspected. FINDINGS: BRAIN AND VENTRICLES: No acute infarct. No intracranial hemorrhage. No mass. No midline shift. No hydrocephalus. The sella is unremarkable. Normal flow voids. T2 and FLAIR hyperintensity in the periventricular and subcortical white matter compatible with moderate chronic microvascular ischemic changes. Generalized parenchymal volume loss. Remote infarcts in the left cerebellum. ORBITS: Bilateral lens  replacement. SINUSES AND MASTOIDS: No acute abnormality. BONES AND SOFT TISSUES: Normal marrow signal. No acute soft tissue abnormality. IMPRESSION: 1. No acute intracranial abnormality. 2. Moderate chronic microvascular ischemic disease and generalized parenchymal volume loss. 3. Remote left cerebellar infarcts. Electronically signed by: Donnice Mania MD 10/16/2023 01:59 PM EDT RP Workstation: [id number]   CT ANGIO HEAD NECK W WO CM (CODE STROKE) Result Date: 10/16/2023 EXAM: CTA HEAD AND NECK WITHOUT AND WITH 10/16/2023 01:06:31  PM TECHNIQUE: CTA of the head and neck was performed with and without the administration of intravenous contrast. Multiplanar 2D and/or 3D reformatted images are provided for review. Automated exposure control, iterative reconstruction, and/or weight based adjustment of the mA/kV was utilized to reduce the radiation dose to as low as reasonably achievable. Stenosis of the internal carotid arteries measured using NASCET criteria. COMPARISON: Head CT 10/16/2023 CLINICAL HISTORY: Neuro deficit, acute, stroke suspected. Code stroke FINDINGS: CTA NECK: AORTIC ARCH AND ARCH VESSELS: Atherosclerotic calcifications of the aortic arch and arch vessel origins, which remain patent. No dissection or arterial injury. No significant stenosis of the brachiocephalic or subclavian arteries. CERVICAL CAROTID ARTERIES: No dissection, arterial injury, or hemodynamically significant stenosis by NASCET criteria. CERVICAL VERTEBRAL ARTERIES: Severe narrowing of the bilateral distal vertebral arteries at the basilar confluence. The vertebral arteries are patent proximally. No dissection or arterial injury. LUNGS AND MEDIASTINUM: Unremarkable. SOFT TISSUES: No acute abnormality. BONES: No acute abnormality. CTA HEAD: ANTERIOR CIRCULATION: Extensive calcified plaque of the bilateral carotid siphons resulting in at least mild stenosis of the bilateral supraclinoid segments. The anterior and middle cerebral  arteries are patent proximally without significant stenosis. No aneurysm. POSTERIOR CIRCULATION: Severe narrowing of the bilateral distal vertebral arteries at the basilar confluence. No significant stenosis of the posterior cerebral arteries. No significant stenosis of the basilar artery. No aneurysm. OTHER: No dural venous sinus thrombosis on this non-dedicated study. IMPRESSION: 1. No large vessel occlusion. 2. Severe narrowing of the bilateral distal vertebral arteries at the basilar confluence; vertebral arteries remain patent proximally. 3. Extensive calcified plaque of the bilateral carotid siphons with at least mild stenosis of the bilateral supraclinoid ICA segments. Code stroke results were communicated to Dr. Arora at 1:17 pm on 10/16/2023 by secure text paging. Electronically signed by: Ryan Chess MD 10/16/2023 01:18 PM EDT RP Workstation: [id number]   CT HEAD CODE STROKE WO CONTRAST Result Date: 10/16/2023 EXAM: CT HEAD WITHOUT CONTRAST 10/16/2023 12:50:45 PM TECHNIQUE: CT of the head was performed without the administration of intravenous contrast. Automated exposure control, iterative reconstruction, and/or weight based adjustment of the mA/kV was utilized to reduce the radiation dose to as low as reasonably achievable. COMPARISON: 09/08/2023 CLINICAL HISTORY: Neuro deficit, acute, stroke suspected. Left side weakness, aphasia; LKW 1100; Dr voncile FINDINGS: BRAIN AND VENTRICLES: Unchanged old infarct in the inferior aspect of the left cerebellar hemisphere. Stable background of moderate chronic small vessel disease, greater within the right frontal subcortical and deep cerebral white matter. No new loss of gray-white differentiation. No acute hemorrhage. No evidence of acute infarct. No hydrocephalus. No extra-axial collection. No mass effect or midline shift. ORBITS: No acute abnormality. SINUSES: No acute abnormality. SOFT TISSUES AND SKULL: No acute soft tissue abnormality. No skull fracture.  [place] Stroke Program Early CT (ASPECT) Score: Ganglionic (caudate, IC, lentiform nucleus, insula, M1-M3): 7 Supraganglionic (M4-M6): 3 Total: 10 IMPRESSION: 1. No acute intracranial abnormality. ASPECT score: 10. 2. Stable background of moderate chronic small vessel disease, greater within the right frontal subcortical and deep cerebral white matter. Code stroke results were communicated to Dr. Arora at 1:03 pm on 10/16/2023 by secure messaging. Electronically signed by: Ryan Chess MD 10/16/2023 01:03 PM EDT RP Workstation: [id number]   Assessment/Plan Allison Duffy is a 89 y.o. female with medical history significant for  A-fib recently discontinued anticoagulation, HTN, HLD, combined systolic and diastolic HF, arthritis, T2DM and CAD who presented to the ED for evaluation of dizziness and admitted for further evaluation.   # Dizziness #  Vertebrobasilar insufficiency - Patient presented with acute onset of dizziness and generalized weakness - Patient globally weak but no focal neurodeficits on exam - CTA head and MRI brain does not show any acute intracranial abnormalities - CTA head and neck with no LVO but shows severe narrowing of the bilateral distal vertebral arteries at the basilar confluence - There is a concern for possible vertebrobasilar insufficiency versus MRI negative stroke - Patient does have history of paroxysmal A-fib however rate controlled so unclear if this is contributing to her presenting symptoms - Neurology following, recommends VBI/stroke/TIA workup - Check orthostatic vitals and echocardiogram - IV LR 1 L bolus followed by 100 cc/h infusion - Follow-up A1c, lipid panel, mag, TSH and Phos - PT/OT/SLP eval and treat - Frequent neurochecks - Telemetry  # CKD 3B - Creatinine of 1.38 not significantly different from her baseline of 1.2-1.4 - Patient does look quite dry on exam - IV LR 1 L bolus followed by IV LR 100 cc/hr - Trend renal function and avoid  nephrotoxic agents  # Hypertensive urgency - BP elevated with SBP in the 140s to 180s - Permissive hypertension as above, hold all BP meds today and resume tomorrow - IV hydralazine  PRN for SBP > 220/110  # Chronic diastolic HF - Last TTE February 2025 showed EF 60 to 65%, mild LVH, G1DD, and severely dilated LA - Patient quite dry on exam, hold Lasix  for today  # A-fib - Rate controlled, remains in normal sinus - Per daughter, Eliquis  recently discontinued due to falls - Telemetry  # T2DM with hyperglycemia - Last A1c 7.9% 6 months ago - Blood glucose elevated to 176 on admission - SSI with meals, CBG monitoring - Follow-up repeat A1c  # CAD - Chronic and stable, no chest pain - Continue aspirin  and Ranexa   # HLD - Continue rosuvastatin  - Follow-up lipid panel   DVT prophylaxis: Lovenox      Code Status: Limited: Do not attempt resuscitation (DNR) -DNR-LIMITED -Do Not Intubate/DNI   Consults called: Neurology  Family Communication: Discussed admission with daughter at bedside  Severity of Illness: The appropriate patient status for this patient is INPATIENT. Inpatient status is judged to be reasonable and necessary in order to provide the required intensity of service to ensure the patient's safety. The patient's presenting symptoms, physical exam findings, and initial radiographic and laboratory data in the context of their chronic comorbidities is felt to place them at high risk for further clinical deterioration. Furthermore, it is not anticipated that the patient will be medically stable for discharge from the hospital within 2 midnights of admission.   * I certify that at the point of admission it is my clinical judgment that the patient will require inpatient hospital care spanning beyond 2 midnights from the point of admission due to high intensity of service, high risk for further deterioration and high frequency of surveillance required.*  Level of care:  Progressive    Lou Claretta HERO, MD 10/16/2023, 7:02 PM [facility] Pager: [phone] Isaiah 41:10   If 7PM-7AM, please contact night-coverage [email or web address] Password [email or web address]

## 2023-10-16 NOTE — Consult Note (Addendum)
 NEUROLOGY CONSULT NOTE   Date of service: October 16, 2023 Patient Name: Allison Duffy MRN:  [id number] DOB:  [birth year] Chief Complaint: dizziness Requesting Provider: Pamella Ozell DELENA, DO  History of Present Illness  Allison Duffy is a 89 y.o. female with hx of acute on chronic combined systolic and diastolic CHF, arthritis, chest pain, hyperlipidemia, hypertension, type 2 diabetes, A-fib previously on Eliquis  discontinued medication approximately 2 weeks ago according to patient's daughter due to 2 recent falls presents to the ED 9/19 for evaluation of acute onset of dizziness. Yesterday she was scheduled to go to a restaurant with family at noon and told them she could not go due to feeling dizzy. These symptoms resolved and patient woke up in her usual state of health this morning and went with her daughter to get her hair done and go to the grocery store. Daughter notes that she started to feel dizzy after getting back in the car after the hair appointment and stated she wouldn't be able to go to the grocery store and needed to go back home to lay down. After this discussion it was decided to bring her to the ED for further evaluation. She has a history of dizziness with some of these presentations related to episodes of hypoglycemia as well as a history of anxiety.  LKW: 11:00 Modified rankin score: 2-Slight disability-UNABLE to perform all activities but does not need assistance IV Thrombolysis:No TNK, MRI negative, low suspicion for acute ischemia EVT: No, presentation is not consistent with an LVO.  NIHSS components Score: Comment  1a Level of Conscious 0[x]  1[]  2[]  3[]      1b LOC Questions 0[]  1[x]  2[]       1c LOC Commands 0[x]  1[]  2[]       2 Best Gaze 0[x]  1[]  2[]       3 Visual 0[x]  1[]  2[]  3[]      4 Facial Palsy 0[x]  1[]  2[]  3[]      5a Motor Arm - left 0[]  1[x]  2[]  3[]  4[]  UN[]  Chronic mild weakness after shoulder surgery.  5b Motor Arm - Right 0[x]  1[]  2[]  3[]  4[]  UN[]     6a Motor Leg - Left 0[]  1[]  2[x]  3[]  4[]  UN[]  Questionable chronic, daughter notes increased swelling in the left and ? weakness.  6b Motor Leg - Right 0[x]  1[]  2[]  3[]  4[]  UN[]    7 Limb Ataxia 0[x]  1[]  2[]  UN[]      8 Sensory 0[]  1[x]  2[]  UN[]    Left face decreased sensation to light touch  9 Best Language 0[x]  1[]  2[]  3[]      10 Dysarthria 0[x]  1[]  2[]  UN[]      11 Extinct. and Inattention 0[x]  1[]  2[]       TOTAL:  5     ROS  Comprehensive ROS performed and pertinent positives documented in HPI  Past History   Past Medical History:  Diagnosis Date   Acute on chronic combined systolic and diastolic CHF (congestive heart failure) (HCC) 01/28/2019   Arthritis    scattered joints (07/29/2013)   Chest pain    Colitis    Gout attack ?1980's   Hyperlipidemia    Hypertension    PONV (postoperative nausea and vomiting)    Type II diabetes mellitus (HCC)     Past Surgical History:  Procedure Laterality Date   APPENDECTOMY     CARPAL TUNNEL RELEASE Right    CATARACT EXTRACTION W/ INTRAOCULAR LENS  IMPLANT, BILATERAL Bilateral    CHOLECYSTECTOMY     KNEE ARTHROSCOPY Right  LEFT HEART CATH AND CORONARY ANGIOGRAPHY N/A 04/12/2020   Procedure: LEFT HEART CATH AND CORONARY ANGIOGRAPHY;  Surgeon: Court Dorn PARAS, MD;  Location: [facility];  Service: Cardiovascular;  Laterality: N/A;   OLECRANON BURSECTOMY Left 07/29/2013   in ER   PARATHYROIDECTOMY     REVERSE SHOULDER ARTHROPLASTY Left 03/27/2023   Procedure: REVERSE TOTAL  SHOULDER ARTHROPLASTY WITH CLOSED FIXATION OF DISTAL RADIUS;  Surgeon: Sharl Selinda Dover, MD;  Location: [facility] OR;  Service: Orthopedics;  Laterality: Left;   TONSILLECTOMY     TOTAL SHOULDER REPLACEMENT  2017   VAGINAL HYSTERECTOMY     Family History: Family History  Problem Relation Age of Onset   Hypertension Father    Heart disease Father    Heart disease Mother    Diabetes Mother    Hypertension Mother    Heart disease Brother        both  brothers   Diabetes Brother    Hypertension Brother    Social History  reports that she quit smoking about 70 years ago. Her smoking use included cigarettes. She started smoking about 75 years ago. She has never used smokeless tobacco. She reports that she does not drink alcohol  and does not use drugs.  Allergies  Allergen Reactions   Codeine Nausea And Vomiting   Hydrocodone  Nausea And Vomiting    Nausea and vomiting   Prednisone      Other reaction(s): sick on her stomach   Sulfa Antibiotics Itching, Nausea Only and Other (See Comments)   Medications   Current Facility-Administered Medications:    sodium chloride  flush (NS) 0.9 % injection 3 mL, 3 mL, Intravenous, Once, Allison Colon, PA-C  Current Outpatient Medications:    acetaminophen  (TYLENOL ) 325 MG tablet, Take 2 tablets (650 mg total) by mouth every 6 (six) hours as needed for mild pain (pain score 1-3) or moderate pain (pain score 4-6)., Disp: , Rfl:    amLODipine  (NORVASC ) 10 MG tablet, Take 10 mg by mouth at bedtime., Disp: , Rfl:    apixaban  (ELIQUIS ) 2.5 MG TABS tablet, Take 2.5 mg by mouth 2 (two) times daily., Disp: , Rfl:    carboxymethylcellulose 1 % ophthalmic solution, Place 1 drop into both eyes 2 (two) times daily., Disp: , Rfl:    cholecalciferol (VITAMIN D ) 1000 UNITS tablet, Take 1,000 Units by mouth daily with lunch., Disp: , Rfl:    cholestyramine  (QUESTRAN ) 4 g packet, Take 4 g by mouth daily as needed (IBS)., Disp: , Rfl:    cloNIDine  (CATAPRES ) 0.1 MG tablet, TAKE 2 TABLETS BY MOUTH EVERY MORNING AND TAKE 1 TABLET BY MOUTH EVERY NIGHT AT BEDTIME, Disp: 270 tablet, Rfl: 1   furosemide  (LASIX ) 20 MG tablet, Take 1 tablet (20 mg total) by mouth daily., Disp: 30 tablet, Rfl: 3   hydrALAZINE  (APRESOLINE ) 25 MG tablet, TAKE 1 1/2 TABLETS(37.5 MG) THREE TIMES DAILY (Patient taking differently: TAKE 1 1/2 TABLETS(37.5 MG) THREE TIMES DAILY with 1 (20mg ) tablet of isosorbide ), Disp: 405 tablet, Rfl: 2   HYDROmorphone   (DILAUDID ) 2 MG tablet, Take 0.5 tablets (1 mg total) by mouth every 8 (eight) hours as needed for severe pain (pain score 7-10)., Disp: 10 tablet, Rfl: 0   insulin  glargine (LANTUS ) 100 UNIT/ML injection, Inject 0.2 mLs (20 Units total) into the skin daily., Disp: , Rfl:    isosorbide  dinitrate (ISORDIL ) 20 MG tablet, TAKE 1 TABLET BY MOUTH THREE TIMES DAILY. TAKE WITH HYDRALAZINE  37.5MG (1 AND A 1/2 TABLETS), Disp: 270 tablet, Rfl:  2   metFORMIN  (GLUCOPHAGE ) 500 MG tablet, Take 1 tablet (500 mg total) by mouth daily with breakfast., Disp: , Rfl:    Multiple Vitamin (MULTIVITAMIN WITH MINERALS) TABS, Take 1 tablet by mouth daily with supper., Disp: , Rfl:    nitroGLYCERIN  (NITROSTAT ) 0.4 MG SL tablet, PLACE 1 TABLET (0.4 MG TOTAL) UNDER THE TONGUE EVERY FIVE MINUTES X 3 DOSES AS NEEDED FOR CHEST PAIN., Disp: 25 tablet, Rfl: 1   omeprazole  (PRILOSEC) 40 MG capsule, TAKE 1 CAPSULE BY MOUTH DAILY WITH SUPPER, Disp: 30 capsule, Rfl: 11   potassium chloride  (KLOR-CON ) 10 MEQ tablet, TAKE 1 TABLET(10 MEQ) BY MOUTH DAILY-- MAY TAKE AN EXTRA WITH A WEIGHT INCREASE OF 2LBS OVERNIGHT OR 5 LBS TOTAL AS DIRECTED, Disp: 30 tablet, Rfl: 9   Probiotic Product (PRO-BIOTIC BLEND) CAPS, Take 1 capsule by mouth at bedtime. Takes OTC probiotic, Disp: , Rfl:    ranolazine  (RANEXA ) 500 MG 12 hr tablet, TAKE 1 TABLET(500 MG) BY MOUTH TWICE DAILY, Disp: 180 tablet, Rfl: 2   sitaGLIPtin (JANUVIA) 100 MG tablet, Take 100 mg by mouth daily with lunch., Disp: , Rfl:   Vitals   Vitals:   10/16/23 1245 10/16/23 1300 10/16/23 1345 10/16/23 1400  BP: (!) 180/71 (!) 186/68 (!) 162/70 (!) 156/69  Pulse: 77 71 76 75  Resp: 18 11 (!) 22 10  Temp:      TempSrc:      SpO2: 100% 100% 100% 100%  Weight:      Height:        Body mass index is 22.58 kg/m.  Physical Exam   Constitutional: Appears well-developed and well-nourished.  Psych: Affect appropriate to situation.  Appears anxious Eyes: No scleral injection.   HENT: No OP obstruction.  Head: Normocephalic.  Cardiovascular: Atrial fibrillation, on bedside cardiac monitor Respiratory: Effort normal, non-labored breathing, on room air GI: Soft.  No distension. There is no tenderness.  Skin: WDI.   Neurologic Examination   Mental Status: Patient is awake, alert, oriented to person, place, year, and situation.  Incorrectly states that the month is August Patient is able to give a clear and coherent history. Loss of some fluency per patient's daughter though no overt aphasia. Able to name objects and repeat phrases correctly. No dysarthria noted.  Cranial Nerves: II: Blinks to threat in bilateral visual fields. PERRL III,IV, VI: EOMI without ptosis or diploplia.  V: Decreased sensation to light touch on the left face VII: Facial movement is symmetric resting and with movement VIII: Hearing is intact to loud voice X: Phonation intact XI: Shoulder shrug is symmetric. XII: Tongue protrudes midline  Motor: Tone is normal. Bulk is normal.  Reduced grip strength bilaterally, increased weakness to antigravity movement on the left upper extremity with drift, chronic from prior shoulder surgery. Left lower extremity minimal antigravity elevation with drift to bed.  Questionable chronic left lower extremity weakness and swelling per patient's daughter at bedside.  Right lower extremity at times elevates antigravity without vertical drift and at other times does have some wobbling.  Right upper extremity is able to elevate antigravity without vertical drift. Sensory: Sensation is symmetric to light touch in the arms and legs. No extinction to DSS present.   Cerebellar: No overt ataxia.  Labs/Imaging/Neurodiagnostic studies  CBC:  Recent Labs  Lab 10/16/23 1243 10/16/23 1305  WBC 6.8  --   NEUTROABS 4.8  --   HGB 12.6 12.2  HCT 36.9 36.0  MCV 98.7  --   PLT  195  --    Basic Metabolic Panel:  Lab Results  Component Value Date   NA 140  10/16/2023   K 3.7 10/16/2023   CO2 21 (L) 05/29/2023   GLUCOSE 185 (H) 10/16/2023   BUN 29 (H) 10/16/2023   CREATININE 1.40 (H) 10/16/2023   CALCIUM  9.1 05/29/2023   GFRNONAA 39 (L) 05/29/2023   GFRAA >60 01/29/2019   Lipid Panel:  Lab Results  Component Value Date   LDLCALC 42 07/26/2022   HgbA1c:  Lab Results  Component Value Date   HGBA1C 7.9 (H) 03/24/2023  INR  Lab Results  Component Value Date   INR 1.1 10/16/2023   APTT  Lab Results  Component Value Date   APTT 29 10/16/2023   CT Head without contrast(Personally reviewed): 1. No acute intracranial abnormality. ASPECT score: 10. 2. Stable background of moderate chronic small vessel disease, greater within the right frontal subcortical and deep cerebral white matter.  CT angio Head and Neck with contrast(Personally reviewed): 1. No large vessel occlusion. 2. Severe narrowing of the bilateral distal vertebral arteries at the basilar confluence; vertebral arteries remain patent proximally. 3. Extensive calcified plaque of the bilateral carotid siphons with at least mild stenosis of the bilateral supraclinoid ICA segments.  MRI Brain(Personally reviewed): 1. No acute intracranial abnormality. 2. Moderate chronic microvascular ischemic disease and generalized parenchymal volume loss. 3. Remote left cerebellar infarcts.  ASSESSMENT   Allison Duffy is a 89 y.o. female from year with a PMHx of atrial fibrillation not on [facility] (discontinued approximately 2 weeks ago due to two recent falls). Presented to ED for evaluation of acute onset dizziness with exam findings of left sided weakness (chronic). Pt endorses nausea, HA, and dizziness during evaluation. Daughter notes that patient has a history of anxiety as well as transient dizziness episodes sometimes associated with low blood sugar including a transient episode yesterday afternoon.CT and MRI imaging without acute findings, CTA with severe vertebral artery stenosis.  Overall low suspicion of acute ischemic stroke given isolated dizziness without further focal exam findings and history of transient dizziness episodes as well. History and presentation suspicious for BPPV, anxiety, or possibly UTI.  RECOMMENDATIONS  - Given ativan  for MRI tolerance, zofran  for nausea during exam - UA and chest xray for infection - IV fluids - Meclizine  - Trial of diazepam  - PT/OT assessment with vestibular rehab. ______________________________________________________________________  Bonney Mimi LELON Denna, NP   Attending addendum Patient seen and examined as a code stroke.   Allison Duffy is a 89 year old woman who presented for evaluation of sudden onset of dizziness and was noted to have some left-sided weakness for which a code stroke was activated.  Last known well was 11 AM when her daughter picked her up after her appointment, and when they were ready to get out of the car in a store, she said that she does not feel right.  She took the patient home and in the driveway, the patient reported extreme dizziness.  EMS was called and brought the patient into the hospital.  She was nonfocal on exam for the EMS but on the EDP examination, had some left-sided weakness in addition to providing complaints of dizziness for which code stroke was activated. She was seen in the CT scanner and evaluated emergently. She has a history of atrial fibrillation but has not been on A-fib due to history of falls.  She is on aspirin  only. She also has a history of hypertension and hyperlipidemia along with type 2  diabetes.  Chart also reflects a history of combined systolic and diastolic heart failure.   Vital signs: Heart rate between 59 and 77 bpm, blood pressure 170/86, respiratory rate 11.  Oxygen  saturation 100%. General examination: Awake alert oriented x 3 HEENT: Normocephalic atraumatic Lungs: Clear Cardiovascular: Irregularly irregular Neurological examination:  Awake alert  oriented x 3 No dysarthria No aphasia Cranial nerves II to XII intact Motor examination with left upper and lower extremity drift.  Right side no drift. Sensation intact to light touch Coordination examination reveals no dysmetria   Laboratory findings: Sodium 140, potassium 3.7, chloride 106, glucose 185, BUN 29, creatinine 1.4.  Hemoglobin 12.2, hematocrit 36   Imaging personally reviewed: CT head: Code stroke protocol CT head with no acute findings. CT angiography head and neck code stroke protocol: No ELVO.  Severe narrowing of bilateral distal vertebral arteries at the basilar confluence.  Vertebral arteries remain patent proximally.  Extensive calcified plaque in bilateral carotid siphons with mild stenosis. Stat MRI brain: On my personal review-no evidence of acute infarction   Assessment and recommendations: 89 year old with history of atrial fibrillation not on anticoagulation, hypertension, hyperlipidemia presenting for sudden onset dizziness and noted to have some left-sided weakness.  She has left shoulder surgery done earlier this year and also gets more swelling on the left leg in comparison to the right per the daughter.  She did have some left-sided drift on examination.  Due to the fact that she is not anticoagulated, we took her for stat MRI which was negative for acute stroke. She does have severe vertebral artery stenosis at the basilar confluence. Her symptoms could be related to vertebrobasilar insufficiency but her blood pressures were high enough, so that is  not my first differential. Other possibility is peripheral vertigo.  Follow recommendations below and then will reassess and reevaluate.   At this point, my recommendations are: - IV fluids - Meclizine  - Trial of diazepam  - ua AND cxr - PT/OT assessment with vestibular rehab. Will reassess   Addendum-reassessed at 4:50 PM She still remains symptomatic UA pending Given her severe vertebral artery stenosis  of the basilar confluence, dizziness could be from either an MRI negative brainstem/cerebellar stroke or vertebrobasilar insufficiency. I would recommend admission for VBI/stroke/TIA workup and frequent neurochecks  Impression: Dizziness likely secondary to vertebrobasilar insufficiency versus MRI negative stroke.  Updated recommendations: Admit to hospitalist Frequent neurochecks Telemetry 2D echo A1c Lipid panel Therapy assessments ASA for now - may need consideration for DOAC - will defer to stroke team rounding in the AM Avoid hypotension and allow permissive hypertension-treat only if systolic is greater than 220 on a as needed basis for the next day or so and then start normalizing blood pressures.   Plan was relayed to be B. Nivia, PA-c   -- A. Voncile, MD Neurology [facility]

## 2023-10-16 NOTE — Code Documentation (Signed)
 Stroke Response Nurse Documentation Code Documentation  Allison Duffy is a 89 y.o. female arriving to [facility]  via [facility] on 10/16/2023 with past medical hx of DM, HTN, CHF, paroxysmal afib not on eliquis . On No antithrombotic. Code stroke was activated by ED.   Patient from home where she was LKW at 1100 and now complaining of left sided weakness and dizziness. Per family, patient got her hair done and then went grocery shopping when she became dizzy. He daughter took her home and she was unable to get out of the vehicle.   Stroke team at the bedside on patient arrival. Labs drawn and patient cleared for CT by EDP. Patient to CT with team. NIHSS 4, see documentation for details and code stroke times. Patient with disoriented, left arm weakness, and bilateral leg weakness on exam. The following imaging was completed:  CT Head, CTA, and MRI. Patient is not a candidate for IV Thrombolytic due to no stroke noted on imaging per MD Patient is not a candidate for IR due to no LVO noted on imaging per MD.   Care Plan: Code Stroke canceled per MD Voncile.   Bedside handoff with ED RN Darlyn.    Allison Duffy  Stroke Response RN

## 2023-10-16 NOTE — Evaluation (Signed)
 Physical Therapy Evaluation Patient Details Name: Allison Duffy MRN: [id number] DOB: 30-Jan-1926 Today's Date: 10/16/2023  History of Present Illness  Allison Duffy is a 89 y.o. female admitted 9/19 with acute onset of dizziness. workup for UTI.  PMH: acute on chronic combined systolic and diastolic CHF, arthritis, chest pain, hyperlipidemia, hypertension, type 2 diabetes, A-fib previously on Eliquis  discontinued medication approximately 2 weeks ago according to patient's daughter due to 2 recent falls  Clinical Impression  Pt admitted with above diagnosis. Pt positive for left posterior canal BPPV and treated via Epley maneuver. Pt dizzy after treatment but was able to stand to RW with min assist of 2. Needs further assessment in am to see if Epley effective.  Messaged MD. Will follow acutely.  Pt currently with functional limitations due to the deficits listed below (see PT Problem List). Pt will benefit from acute skilled PT to increase their independence and safety with mobility to allow discharge.           If plan is discharge home, recommend the following: Assistance with cooking/housework;Assist for transportation   Can travel by private vehicle        Equipment Recommendations None recommended by PT  Recommendations for Other Services       Functional Status Assessment Patient has had a recent decline in their functional status and demonstrates the ability to make significant improvements in function in a reasonable and predictable amount of time.     Precautions / Restrictions Precautions Precautions: Fall Restrictions Weight Bearing Restrictions Per Provider Order: No      Mobility  Bed Mobility Overal bed mobility: Needs Assistance Bed Mobility: Rolling, Sidelying to Sit, Sit to Supine Rolling: Mod assist, +2 for physical assistance, Used rails Sidelying to sit: Mod assist, +2 for safety/equipment, Used rails   Sit to supine: Min assist   General bed mobility  comments: Pt positive for left posterior canal BPPV and treated via Epley maneuver.  Pt needed mod assist to roll bilaterally.  Mod assist to sit up due to pt dizzy after maneuver.    Transfers Overall transfer level: Needs assistance Equipment used: Rolling walker (2 wheels) Transfers: Sit to/from Stand Sit to Stand: Min assist, +2 safety/equipment, From elevated surface           General transfer comment: Able to stand to RW with cues for hand placement and min assist of 2 person. Took side steps to [facility] with min assist and use of RW.    Ambulation/Gait                  Stairs            Wheelchair Mobility     Tilt Bed    Modified Rankin (Stroke Patients Only)       Balance                                             Pertinent Vitals/Pain Pain Assessment Pain Assessment: No/denies pain    Home Living Family/patient expects to be discharged to:: Private residence Living Arrangements: Alone Available Help at Discharge: Family;Available PRN/intermittently Type of Home: House Home Access: Stairs to enter Entrance Stairs-Rails: Doctor, general practice of Steps: 5   Home Layout: One level Home Equipment: Agricultural consultant (2 wheels);Rollator (4 wheels);Tub bench;Grab bars - tub/shower;Cane - quad;Hand held shower head  Prior Function               Mobility Comments: used primarily  RW or cane without assist PTA ADLs Comments: No driving, light cooking, does laundry and dishes, B/D self     Extremity/Trunk Assessment   Upper Extremity Assessment Upper Extremity Assessment: Defer to OT evaluation    Lower Extremity Assessment Lower Extremity Assessment: Generalized weakness    Cervical / Trunk Assessment Cervical / Trunk Assessment: Kyphotic  Communication   Communication Communication: No apparent difficulties Factors Affecting Communication: Hearing impaired    Cognition Arousal: Lethargic, Suspect  due to medications (nurse just gave Valium ) Behavior During Therapy: Flat affect   PT - Cognitive impairments: No apparent impairments                         Following commands: Intact       Cueing       General Comments General comments (skin integrity, edema, etc.): 70 bpm, 100% RA, 147/75    Exercises     Assessment/Plan    PT Assessment Patient needs continued PT services  PT Problem List Decreased balance;Decreased mobility;Decreased knowledge of use of DME;Decreased safety awareness;Decreased knowledge of precautions       PT Treatment Interventions DME instruction;Gait training;Functional mobility training;Therapeutic exercise;Therapeutic activities;Balance training;Patient/family education;Stair training    PT Goals (Current goals can be found in the Care Plan section)  Acute Rehab PT Goals Patient Stated Goal: to go home PT Goal Formulation: With patient Time For Goal Achievement: 10/30/23 Potential to Achieve Goals: Good    Frequency Min 2X/week     Co-evaluation               AM-PAC PT 6 Clicks Mobility  Outcome Measure Help needed turning from your back to your side while in a flat bed without using bedrails?: A Lot Help needed moving from lying on your back to sitting on the side of a flat bed without using bedrails?: A Lot Help needed moving to and from a bed to a chair (including a wheelchair)?: Total Help needed standing up from a chair using your arms (e.g., wheelchair or bedside chair)?: Total Help needed to walk in hospital room?: Total Help needed climbing 3-5 steps with a railing? : Total 6 Click Score: 8    End of Session Equipment Utilized During Treatment: Gait belt Activity Tolerance: Patient limited by fatigue (limited by dizziness) Patient left: with call bell/phone within reach;with family/visitor present (on stretcher) Nurse Communication: Mobility status PT Visit Diagnosis: Muscle weakness (generalized)  (M62.81);BPPV;Dizziness and giddiness (R42) BPPV - Right/Left : Left    Time: 8456-8380 PT Time Calculation (min) (ACUTE ONLY): 36 min   Charges:   PT Evaluation $PT Eval Moderate Complexity: 1 Mod PT Treatments $Canalith Rep Proc: 8-22 mins PT General Charges $$ ACUTE PT VISIT: 1 Visit         Joan Avetisyan M,PT Acute Rehab Services [phone]   Stephane JULIANNA Bevel 10/16/2023, 4:36 PM

## 2023-10-16 NOTE — ED Triage Notes (Signed)
 BIB EMS from home r/t weakness, Nausea, dizziness, HTN that started today. Denies N/D. Took BP this morning. A&Ox4.

## 2023-10-16 NOTE — ED Provider Notes (Signed)
  EMERGENCY DEPARTMENT AT [facility] Provider Note   CSN: [id number] Arrival date & time: 10/16/23  1218     Patient presents with: Weakness   Allison Duffy is a 89 y.o. female.   The history is provided by the patient, a relative and medical records. No language interpreter was used.  Weakness    89 year old female with significant history of diabetes, hypertension, CHF, paroxysmal atrial fibrillation, brought here here via EMS from home with concerns of weakness.  History obtained through patient and through daughter who is now at bedside.  Patient states she felt fine this morning she went to get her head down and she was in the process of going grocery shopping with her daughter when she developed acute onset of dizziness.  She is unable to tell me exactly how it feels but felt more like lightheadedness, feeling unsteady and states my head felt funny.  She also reported feeling weak as well and having trouble walking.  She does not endorse any significant headache no chest pain no trouble breathing no abdominal pain.  She does feels nauseous.  She denies focal numbness.  Patient was recently taken off her Eliquis  due to having recurrent falls.  She is currently on aspirin .  Daughter states last known normal was less than 3 hours ago.  Patient does have a MOST form with her  Prior to Admission medications   Medication Sig Start Date End Date Taking? Authorizing Provider  acetaminophen  (TYLENOL ) 325 MG tablet Take 2 tablets (650 mg total) by mouth every 6 (six) hours as needed for mild pain (pain score 1-3) or moderate pain (pain score 4-6). 03/30/23   Mdala-Gausi, Masiku Agatha, MD  amLODipine  (NORVASC ) 10 MG tablet Take 10 mg by mouth at bedtime.    [provider]  apixaban  (ELIQUIS ) 2.5 MG TABS tablet Take 2.5 mg by mouth 2 (two) times daily.    [provider]  carboxymethylcellulose 1 % ophthalmic solution Place 1 drop into both eyes 2 (two)  times daily.    [provider]  cholecalciferol (VITAMIN D ) 1000 UNITS tablet Take 1,000 Units by mouth daily with lunch.    [provider]  cholestyramine  (QUESTRAN ) 4 g packet Take 4 g by mouth daily as needed (IBS).    [provider]  cloNIDine  (CATAPRES ) 0.1 MG tablet TAKE 2 TABLETS BY MOUTH EVERY MORNING AND TAKE 1 TABLET BY MOUTH EVERY NIGHT AT BEDTIME 12/29/22   Court Dorn PARAS, MD  furosemide  (LASIX ) 20 MG tablet Take 1 tablet (20 mg total) by mouth daily. 08/27/22 03/23/23  Emelia Josefa HERO, NP  hydrALAZINE  (APRESOLINE ) 25 MG tablet TAKE 1 1/2 TABLETS(37.5 MG) THREE TIMES DAILY Patient taking differently: TAKE 1 1/2 TABLETS(37.5 MG) THREE TIMES DAILY with 1 (20mg ) tablet of isosorbide  11/11/22   Court Dorn PARAS, MD  HYDROmorphone  (DILAUDID ) 2 MG tablet Take 0.5 tablets (1 mg total) by mouth every 8 (eight) hours as needed for severe pain (pain score 7-10). 03/30/23   Mdala-Gausi, Masiku Agatha, MD  insulin  glargine (LANTUS ) 100 UNIT/ML injection Inject 0.2 mLs (20 Units total) into the skin daily. 03/31/23   Mdala-Gausi, Masiku Agatha, MD  isosorbide  dinitrate (ISORDIL ) 20 MG tablet TAKE 1 TABLET BY MOUTH THREE TIMES DAILY. TAKE WITH HYDRALAZINE  37.5MG (1 AND A 1/2 TABLETS) 11/11/22   Court Dorn PARAS, MD  metFORMIN  (GLUCOPHAGE ) 500 MG tablet Take 1 tablet (500 mg total) by mouth daily with breakfast. 03/30/23   Mdala-Gausi, Golden Pillow, MD  Multiple Vitamin (MULTIVITAMIN WITH MINERALS) TABS Take 1 tablet by mouth daily with supper.    [provider]  nitroGLYCERIN  (NITROSTAT ) 0.4 MG SL tablet PLACE 1 TABLET (0.4 MG TOTAL) UNDER THE TONGUE EVERY FIVE MINUTES X 3 DOSES AS NEEDED FOR CHEST PAIN. 04/13/20 03/23/23  Anner Alm ORN, MD  omeprazole  (PRILOSEC) 40 MG capsule TAKE 1 CAPSULE BY MOUTH DAILY WITH SUPPER 01/15/22   Court Dorn PARAS, MD  potassium chloride  (KLOR-CON ) 10 MEQ tablet TAKE 1 TABLET(10 MEQ) BY MOUTH DAILY-- MAY TAKE AN EXTRA WITH A  WEIGHT INCREASE OF 2LBS OVERNIGHT OR 5 LBS TOTAL AS DIRECTED 11/11/22   Court Dorn PARAS, MD  Probiotic Product (PRO-BIOTIC BLEND) CAPS Take 1 capsule by mouth at bedtime. Takes OTC probiotic    Shepard Ade, MD  ranolazine  (RANEXA ) 500 MG 12 hr tablet TAKE 1 TABLET(500 MG) BY MOUTH TWICE DAILY 11/11/22   Court Dorn PARAS, MD  sitaGLIPtin (JANUVIA) 100 MG tablet Take 100 mg by mouth daily with lunch.    [provider]    Allergies: Codeine, Hydrocodone , Prednisone , and Sulfa antibiotics    Review of Systems  Neurological:  Positive for weakness.  All other systems reviewed and are negative.   Updated Vital Signs BP (!) 170/86 (BP Location: Left Arm)   Pulse 64   Temp (!) 97.1 F (36.2 C) (Oral)   Resp 20   Ht 5' 7.2 (1.707 m)   Wt 65.8 kg   SpO2 100%   BMI 22.58 kg/m   Physical Exam Vitals and nursing note reviewed.  Constitutional:      Appearance: She is well-developed.     Comments: Elderly female laying in bed appears anxious and unwell  HENT:     Head: Atraumatic.  Eyes:     Extraocular Movements: Extraocular movements intact.     Conjunctiva/sclera: Conjunctivae normal.     Pupils: Pupils are equal, round, and reactive to light.  Cardiovascular:     Rate and Rhythm: Normal rate. Rhythm irregular.     Pulses: Normal pulses.     Heart sounds: Normal heart sounds.  Pulmonary:     Effort: Pulmonary effort is normal.  Abdominal:     Palpations: Abdomen is soft.  Musculoskeletal:     Cervical back: Normal range of motion and neck supple.  Skin:    Findings: No rash.  Neurological:     Mental Status: She is alert. Mental status is at baseline.     Comments: Neurologic exam:  Speech is dysarthric, pupils equal round reactive to light, difficulty tracking fingers Having difficulty detecting number of fingers in L eye Cranial nerves III through XII normal including no facial droop Follows commands, moves all extremities with 4/5 strength to RUE and  RLE, 3/5 strength to LUE and LLE. Diminished left grip strength Sensation normal to light touch  No limb ataxia No pronator drift Unable to stand up to ambulate.    Psychiatric:        Mood and Affect: Mood normal.     (all labs ordered are listed, but only abnormal results are displayed) Labs Reviewed  CBC - Abnormal; Notable for the following components:      Result Value   RBC 3.74 (*)    All other components within normal limits  COMPREHENSIVE METABOLIC PANEL WITH GFR - Abnormal; Notable for the following components:   CO2 21 (*)    Glucose, Bld 176 (*)    BUN 27 (*)    Creatinine,  Ser 1.38 (*)    GFR, Estimated 35 (*)    All other components within normal limits  URINALYSIS, ROUTINE W REFLEX MICROSCOPIC - Abnormal; Notable for the following components:   Protein, ur 30 (*)    All other components within normal limits  I-STAT CHEM 8, ED - Abnormal; Notable for the following components:   BUN 29 (*)    Creatinine, Ser 1.40 (*)    Glucose, Bld 185 (*)    Calcium , Ion 1.09 (*)    TCO2 21 (*)    All other components within normal limits  PROTIME-INR  APTT  DIFFERENTIAL  ETHANOL  CBG MONITORING, ED    EKG: None  Date: 10/16/2023  Rate: 69  Rhythm: normal sinus rhythm  QRS Axis: normal  Intervals: normal  ST/T Wave abnormalities: normal  Conduction Disutrbances: none  Narrative Interpretation:   Old EKG Reviewed: No significant changes noted    Radiology: DG Chest 2 View Result Date: 10/16/2023 CLINICAL DATA:  Dizziness EXAM: CHEST - 2 VIEW COMPARISON:  03/23/2023 FINDINGS: Frontal and lateral views of the chest demonstrate an unremarkable cardiac silhouette. Stable mitral annular calcifications and atherosclerosis of the aortic arch. Stable chronic pulmonary vascular congestion. No airspace disease, effusion, or pneumothorax. No acute bony abnormalities. Bilateral shoulder arthroplasties. IMPRESSION: 1. Chronic pulmonary vascular congestion without overt edema.  Electronically Signed   By: Ozell Daring M.D.   On: 10/16/2023 15:21   MR BRAIN WO CONTRAST Result Date: 10/16/2023 EXAM: MRI BRAIN WITHOUT CONTRAST 10/16/2023 01:41:53 PM TECHNIQUE: Multiplanar multisequence MRI of the head/brain was performed without the administration of intravenous contrast. COMPARISON: CT head and CTA head and neck earlier same day. CLINICAL HISTORY: Neuro deficit, acute, stroke suspected. FINDINGS: BRAIN AND VENTRICLES: No acute infarct. No intracranial hemorrhage. No mass. No midline shift. No hydrocephalus. The sella is unremarkable. Normal flow voids. T2 and FLAIR hyperintensity in the periventricular and subcortical white matter compatible with moderate chronic microvascular ischemic changes. Generalized parenchymal volume loss. Remote infarcts in the left cerebellum. ORBITS: Bilateral lens replacement. SINUSES AND MASTOIDS: No acute abnormality. BONES AND SOFT TISSUES: Normal marrow signal. No acute soft tissue abnormality. IMPRESSION: 1. No acute intracranial abnormality. 2. Moderate chronic microvascular ischemic disease and generalized parenchymal volume loss. 3. Remote left cerebellar infarcts. Electronically signed by: Donnice Mania MD 10/16/2023 01:59 PM EDT RP Workstation: [id number]   CT ANGIO HEAD NECK W WO CM (CODE STROKE) Result Date: 10/16/2023 EXAM: CTA HEAD AND NECK WITHOUT AND WITH 10/16/2023 01:06:31 PM TECHNIQUE: CTA of the head and neck was performed with and without the administration of intravenous contrast. Multiplanar 2D and/or 3D reformatted images are provided for review. Automated exposure control, iterative reconstruction, and/or weight based adjustment of the mA/kV was utilized to reduce the radiation dose to as low as reasonably achievable. Stenosis of the internal carotid arteries measured using NASCET criteria. COMPARISON: Head CT 10/16/2023 CLINICAL HISTORY: Neuro deficit, acute, stroke suspected. Code stroke FINDINGS: CTA NECK: AORTIC ARCH AND ARCH  VESSELS: Atherosclerotic calcifications of the aortic arch and arch vessel origins, which remain patent. No dissection or arterial injury. No significant stenosis of the brachiocephalic or subclavian arteries. CERVICAL CAROTID ARTERIES: No dissection, arterial injury, or hemodynamically significant stenosis by NASCET criteria. CERVICAL VERTEBRAL ARTERIES: Severe narrowing of the bilateral distal vertebral arteries at the basilar confluence. The vertebral arteries are patent proximally. No dissection or arterial injury. LUNGS AND MEDIASTINUM: Unremarkable. SOFT TISSUES: No acute abnormality. BONES: No acute abnormality. CTA HEAD: ANTERIOR CIRCULATION: Extensive calcified plaque of  the bilateral carotid siphons resulting in at least mild stenosis of the bilateral supraclinoid segments. The anterior and middle cerebral arteries are patent proximally without significant stenosis. No aneurysm. POSTERIOR CIRCULATION: Severe narrowing of the bilateral distal vertebral arteries at the basilar confluence. No significant stenosis of the posterior cerebral arteries. No significant stenosis of the basilar artery. No aneurysm. OTHER: No dural venous sinus thrombosis on this non-dedicated study. IMPRESSION: 1. No large vessel occlusion. 2. Severe narrowing of the bilateral distal vertebral arteries at the basilar confluence; vertebral arteries remain patent proximally. 3. Extensive calcified plaque of the bilateral carotid siphons with at least mild stenosis of the bilateral supraclinoid ICA segments. Code stroke results were communicated to Dr. Arora at 1:17 pm on 10/16/2023 by secure text paging. Electronically signed by: Ryan Chess MD 10/16/2023 01:18 PM EDT RP Workstation: [id number]   CT HEAD CODE STROKE WO CONTRAST Result Date: 10/16/2023 EXAM: CT HEAD WITHOUT CONTRAST 10/16/2023 12:50:45 PM TECHNIQUE: CT of the head was performed without the administration of intravenous contrast. Automated exposure control,  iterative reconstruction, and/or weight based adjustment of the mA/kV was utilized to reduce the radiation dose to as low as reasonably achievable. COMPARISON: 09/08/2023 CLINICAL HISTORY: Neuro deficit, acute, stroke suspected. Left side weakness, aphasia; LKW 1100; Dr voncile FINDINGS: BRAIN AND VENTRICLES: Unchanged old infarct in the inferior aspect of the left cerebellar hemisphere. Stable background of moderate chronic small vessel disease, greater within the right frontal subcortical and deep cerebral white matter. No new loss of gray-white differentiation. No acute hemorrhage. No evidence of acute infarct. No hydrocephalus. No extra-axial collection. No mass effect or midline shift. ORBITS: No acute abnormality. SINUSES: No acute abnormality. SOFT TISSUES AND SKULL: No acute soft tissue abnormality. No skull fracture. [place] Stroke Program Early CT (ASPECT) Score: Ganglionic (caudate, IC, lentiform nucleus, insula, M1-M3): 7 Supraganglionic (M4-M6): 3 Total: 10 IMPRESSION: 1. No acute intracranial abnormality. ASPECT score: 10. 2. Stable background of moderate chronic small vessel disease, greater within the right frontal subcortical and deep cerebral white matter. Code stroke results were communicated to Dr. Arora at 1:03 pm on 10/16/2023 by secure messaging. Electronically signed by: Ryan Chess MD 10/16/2023 01:03 PM EDT RP Workstation: [id number]     .Critical Care  Performed by: Nivia Colon, PA-C Authorized by: Nivia Colon, PA-C   Critical care provider statement:    Critical care time (minutes):  45   Critical care was time spent personally by me on the following activities:  Development of treatment plan with patient or surrogate, discussions with consultants, evaluation of patient's response to treatment, examination of patient, ordering and review of laboratory studies, ordering and review of radiographic studies, ordering and performing treatments and interventions, pulse oximetry,  re-evaluation of patient's condition and review of old charts    Medications Ordered in the ED  meclizine  (ANTIVERT ) tablet 25 mg (25 mg Oral Not Given 10/16/23 1523)  sodium chloride  flush (NS) 0.9 % injection 3 mL (3 mLs Intravenous Given 10/16/23 1437)  ondansetron  (ZOFRAN ) 4 MG/2ML injection (4 mg  Given 10/16/23 1301)  iohexol  (OMNIPAQUE ) 350 MG/ML injection 75 mL (75 mLs Intravenous Contrast Given 10/16/23 1306)  LORazepam  (ATIVAN ) injection 1 mg (1 mg Intravenous Given 10/16/23 1322)  diazepam  (VALIUM ) injection 5 mg (5 mg Intravenous Given 10/16/23 1533)                                    Medical Decision Making  Amount and/or Complexity of Data Reviewed Labs: ordered. Radiology: ordered. ECG/medicine tests: ordered.  Risk Prescription drug management.   BP (!) 170/86 (BP Location: Left Arm)   Pulse 64   Temp (!) 97.1 F (36.2 C) (Oral)   Resp 20   Ht 5' 7.2 (1.707 m)   Wt 65.8 kg   SpO2 100%   BMI 22.58 kg/m   34:74 PM  89 year old female with significant history of diabetes, hypertension, CHF, paroxysmal atrial fibrillation, brought here here via EMS from home with concerns of weakness.  History obtained through patient and through daughter who is now at bedside.  Patient states she felt fine this morning she went to get her head down and she was in the process of going grocery shopping with her daughter when she developed acute onset of dizziness.  She is unable to tell me exactly how it feels but felt more like lightheadedness, feeling unsteady and states my head felt funny.  She also reported feeling weak as well and having trouble walking.  She does not endorse any significant headache no chest pain no trouble breathing no abdominal pain.  She does feels nauseous.  She denies focal numbness.  Patient was recently taken off her Eliquis  due to having recurrent falls.  She is currently on aspirin .  Daughter states last known normal was less than 3 hours ago.  Patient does  have a MOST form with her  Exam notable for dysarthric speech, diminished left grip strength and exhibiting left lower extremity weakness compared to right.  Patient unable to ambulate.  She is also having difficulty tracking my fingers and difficulty detecting numbers of fingers through her left eye.  No obvious facial droop and no obvious neglect.  Patient is within the window to activate code stroke.  Code stroke activated.  Last known normal was less than 3 hours ago.  Furthermore, patient has history of paroxysmal atrial fibrillation previously on Eliquis  but was recently stopped Eliquis  due to having recurrent falls.  This puts her at high risk for stroke.  -Labs ordered, independently viewed and interpreted by me.  Labs remarkable for Cr 1.4, IVF given.  CBG 185.   -The patient was maintained on a cardiac monitor.  I personally viewed and interpreted the cardiac monitored which showed an underlying rhythm of: NSR -Imaging independently viewed and interpreted by me and I agree with radiologist's interpretation.  Result remarkable for brain MRI along with head CTA without acute stroke. Moderate microvascular ischemic disease noted.  Remote cerebella infarcts.   -This patient presents to the ED for concern of dizzy, this involves an extensive number of treatment options, and is a complaint that carries with it a high risk of complications and morbidity.  The differential diagnosis includes stroke, peripheral vertigo, infectious cause, anemia, electrolytes derangement, cardiac arrhythmia -Co morbidities that complicate the patient evaluation includes DM, HTN, CHF, afib -Treatment includes IVF, meclizine , valium , PT/OT -Reevaluation of the patient after these medicines showed that the patient improved -PCP office notes or outside notes reviewed -Discussion with specialist neurologist Dr Voncile, attending Dr. Cleotilde and Dr Pamella. neurology felt that patient's dizziness is likely secondary to vertebral  basilar insufficiency with negative MRI for acute stroke.  Recommend admission to hospitalist.  Appreciate consultation from hospitalist -Escalation to admission/observation considered: pt agrees      Final diagnoses:  Dizziness    ED Discharge Orders     None          Nivia Colon, PA-C 10/16/23  [address]    Pamella Ozell LABOR, DO 10/19/23 1036

## 2023-10-17 ENCOUNTER — Inpatient Hospital Stay (HOSPITAL_COMMUNITY)

## 2023-10-17 DIAGNOSIS — G459 Transient cerebral ischemic attack, unspecified: Secondary | ICD-10-CM

## 2023-10-17 DIAGNOSIS — R42 Dizziness and giddiness: Secondary | ICD-10-CM | POA: Diagnosis not present

## 2023-10-17 LAB — LIPID PANEL
Cholesterol: 96 mg/dL (ref 0–200)
HDL: 46 mg/dL (ref >40)
LDL Cholesterol: 23 mg/dL (ref 0–99)
Total CHOL/HDL Ratio: 2.1 ratio
Triglycerides: 137 mg/dL (ref <150)
VLDL: 27 mg/dL (ref 0–40)

## 2023-10-17 LAB — CBG MONITORING, ED
Glucose-Capillary: 148 mg/dL — ABNORMAL HIGH (ref 70–99)
Glucose-Capillary: 155 mg/dL — ABNORMAL HIGH (ref 70–99)

## 2023-10-17 LAB — BASIC METABOLIC PANEL WITH GFR
Anion gap: 12 (ref 5–15)
BUN: 24 mg/dL — ABNORMAL HIGH (ref 8–23)
CO2: 24 mmol/L (ref 22–32)
Calcium: 8.8 mg/dL — ABNORMAL LOW (ref 8.9–10.3)
Chloride: 102 mmol/L (ref 98–111)
Creatinine, Ser: 1.07 mg/dL — ABNORMAL HIGH (ref 0.44–1.00)
GFR, Estimated: 48 mL/min — ABNORMAL LOW (ref >60)
Glucose, Bld: 137 mg/dL — ABNORMAL HIGH (ref 70–99)
Potassium: 3.3 mmol/L — ABNORMAL LOW (ref 3.5–5.1)
Sodium: 138 mmol/L (ref 135–145)

## 2023-10-17 LAB — GLUCOSE, CAPILLARY
Glucose-Capillary: 163 mg/dL — ABNORMAL HIGH (ref 70–99)
Glucose-Capillary: 209 mg/dL — ABNORMAL HIGH (ref 70–99)

## 2023-10-17 LAB — ECHOCARDIOGRAM COMPLETE
Area-P 1/2: 5.13 cm2
Height: 67.2 in
MV M vel: 6.01 m/s
MV Peak grad: 144.5 mmHg
S' Lateral: 3.3 cm
Weight: 2320 [oz_av]

## 2023-10-17 LAB — CBC
HCT: 32.3 % — ABNORMAL LOW (ref 36.0–46.0)
Hemoglobin: 11.3 g/dL — ABNORMAL LOW (ref 12.0–15.0)
MCH: 33.7 pg (ref 26.0–34.0)
MCHC: 35 g/dL (ref 30.0–36.0)
MCV: 96.4 fL (ref 80.0–100.0)
Platelets: 176 K/uL (ref 150–400)
RBC: 3.35 MIL/uL — ABNORMAL LOW (ref 3.87–5.11)
RDW: 11.8 % (ref 11.5–15.5)
WBC: 7.9 K/uL (ref 4.0–10.5)
nRBC: 0 % (ref 0.0–0.2)

## 2023-10-17 LAB — MAGNESIUM: Magnesium: 1.5 mg/dL — ABNORMAL LOW (ref 1.7–2.4)

## 2023-10-17 LAB — PHOSPHORUS: Phosphorus: 3.9 mg/dL (ref 2.5–4.6)

## 2023-10-17 LAB — TSH: TSH: 0.957 u[IU]/mL (ref 0.350–4.500)

## 2023-10-17 MED ORDER — APIXABAN 2.5 MG PO TABS
2.5000 mg | ORAL_TABLET | Freq: Two times a day (BID) | ORAL | Status: DC
  Administered 2023-10-17 – 2023-10-19 (×4): 2.5 mg via ORAL
  Filled 2023-10-17 (×4): qty 1

## 2023-10-17 MED ORDER — ENSURE PLUS HIGH PROTEIN PO LIQD
237.0000 mL | Freq: Two times a day (BID) | ORAL | Status: DC
  Administered 2023-10-18 – 2023-10-19 (×4): 237 mL via ORAL

## 2023-10-17 MED ORDER — AMLODIPINE BESYLATE 5 MG PO TABS
10.0000 mg | ORAL_TABLET | Freq: Every day | ORAL | Status: DC
  Administered 2023-10-17 – 2023-10-18 (×2): 10 mg via ORAL
  Filled 2023-10-17 (×2): qty 2

## 2023-10-17 MED ORDER — ASPIRIN 81 MG PO TBEC: 81.0000 mg | DELAYED_RELEASE_TABLET | Freq: Every day | ORAL | Status: DC

## 2023-10-17 MED ORDER — MAGNESIUM SULFATE 2 GM/50ML IV SOLN
2.0000 g | Freq: Once | INTRAVENOUS | Status: AC
Start: 2023-10-17 — End: 2023-10-17
  Administered 2023-10-17: 2 g via INTRAVENOUS
  Filled 2023-10-17: qty 50

## 2023-10-17 MED ORDER — MECLIZINE HCL 25 MG PO TABS
12.5000 mg | ORAL_TABLET | Freq: Three times a day (TID) | ORAL | Status: DC | PRN
  Administered 2023-10-18: 12.5 mg via ORAL
  Filled 2023-10-17: qty 1

## 2023-10-17 MED ORDER — ASPIRIN 81 MG PO TBEC
81.0000 mg | DELAYED_RELEASE_TABLET | Freq: Every day | ORAL | Status: DC
  Administered 2023-10-18 – 2023-10-19 (×2): 81 mg via ORAL
  Filled 2023-10-17 (×2): qty 1

## 2023-10-17 MED ORDER — HYDRALAZINE HCL 50 MG PO TABS
50.0000 mg | ORAL_TABLET | Freq: Three times a day (TID) | ORAL | Status: DC
  Administered 2023-10-17 – 2023-10-19 (×7): 50 mg via ORAL
  Filled 2023-10-17 (×7): qty 1

## 2023-10-17 MED ORDER — INFLUENZA VAC SPLIT HIGH-DOSE 0.5 ML IM SUSY
0.5000 mL | PREFILLED_SYRINGE | INTRAMUSCULAR | Status: AC
  Administered 2023-10-19: 0.5 mL via INTRAMUSCULAR
  Filled 2023-10-17: qty 0.5

## 2023-10-17 MED ORDER — ISOSORBIDE DINITRATE 20 MG PO TABS
20.0000 mg | ORAL_TABLET | Freq: Three times a day (TID) | ORAL | Status: DC
  Administered 2023-10-17 – 2023-10-19 (×7): 20 mg via ORAL
  Filled 2023-10-17 (×8): qty 1

## 2023-10-17 MED ORDER — POTASSIUM CHLORIDE 20 MEQ PO PACK
40.0000 meq | PACK | Freq: Once | ORAL | Status: AC
  Administered 2023-10-17: 40 meq via ORAL
  Filled 2023-10-17: qty 2

## 2023-10-17 NOTE — Progress Notes (Signed)
 Physical Therapy Treatment Patient Details Name: Allison Duffy MRN: [id number] DOB: Jun 06, 1926 Today's Date: 10/17/2023   History of Present Illness Allison Duffy is a 89 y.o. female admitted 9/19 with acute onset of dizziness. workup for UTI.  PMH: acute on chronic combined systolic and diastolic CHF, arthritis, chest pain, hyperlipidemia, hypertension, type 2 diabetes, A-fib previously on Eliquis  discontinued medication approximately 2 weeks ago according to patient's daughter due to 2 recent falls    PT Comments  Due to recent imaging performed modified dix-hallpike with minimal cervical extension due to vertebral artery stenosis without significant increase in symptoms or torsional nystagmus. Pt continues to be dizzy which stays constant throughout; most likely related to poor perfusion at this time. Currently pt is Min A to CGA for bed mobility, Min A for sit to stand and CGA to Min A for short non-functional distance gait with RW. Pt lives alone. Due to pt current functional status, home set up and available assistance at home recommending skilled physical therapy services < 3 hours/day in order to address strength, balance and functional mobility to decrease risk for falls, injury, immobility, skin break down and re-hospitalization.      If plan is discharge home, recommend the following: Assistance with cooking/housework;Assist for transportation   Can travel by private vehicle     Yes  Equipment Recommendations  None recommended by PT    Recommendations for Other Services       Precautions / Restrictions Precautions Precautions: Fall Recall of Precautions/Restrictions: Intact Restrictions Weight Bearing Restrictions Per Provider Order: No     Mobility  Bed Mobility Overal bed mobility: Needs Assistance Bed Mobility: Rolling, Sidelying to Sit, Sit to Supine Rolling: Contact guard assist Sidelying to sit: Min assist   Sit to supine: Contact guard assist   General  bed mobility comments: Min A for trunk to midline, CGA for rolling and to supine for safety on stretcher.    Transfers Overall transfer level: Needs assistance Equipment used: Rolling walker (2 wheels) Transfers: Sit to/from Stand, Bed to chair/wheelchair/BSC Sit to Stand: Min assist   Step pivot transfers: Min assist       General transfer comment: Min A for safety/balance and stability.    Ambulation/Gait Ambulation/Gait assistance: Contact guard assist, Min assist Gait Distance (Feet): 20 Feet Assistive device: Rolling walker (2 wheels) Gait Pattern/deviations: Step-through pattern, Decreased step length - right, Decreased step length - left, Shuffle Gait velocity: decreased Gait velocity interpretation: <1.31 ft/sec, indicative of household ambulator   General Gait Details: very short/shuffling step pattern, Min A for safety intermittently with balance and navigating AD with with forward progression of RW from COM.      Balance Overall balance assessment: Needs assistance Sitting-balance support: Bilateral upper extremity supported, Feet unsupported Sitting balance-Leahy Scale: Fair Sitting balance - Comments: close SBA   Standing balance support: Bilateral upper extremity supported, During functional activity, Reliant on assistive device for balance Standing balance-Leahy Scale: Poor Standing balance comment: Intermittent Min A      Communication Communication Communication: No apparent difficulties Factors Affecting Communication: Hearing impaired  Cognition Arousal: Alert Behavior During Therapy: WFL for tasks assessed/performed   PT - Cognitive impairments: No apparent impairments     Following commands: Intact            General Comments General comments (skin integrity, edema, etc.): BP 195/86 in sitting, 185/87 in standing and 187/77 after 3 min of standing.      Pertinent Vitals/Pain Pain Assessment Pain Assessment: No/denies  pain     PT  Goals (current goals can now be found in the care plan section) Acute Rehab PT Goals Patient Stated Goal: to go home PT Goal Formulation: With patient Time For Goal Achievement: 10/30/23 Potential to Achieve Goals: Good Progress towards PT goals: Progressing toward goals    Frequency    Min 2X/week      PT Plan  Continue with current POC        AM-PAC PT 6 Clicks Mobility   Outcome Measure  Help needed turning from your back to your side while in a flat bed without using bedrails?: A Little Help needed moving from lying on your back to sitting on the side of a flat bed without using bedrails?: A Little Help needed moving to and from a bed to a chair (including a wheelchair)?: A Little Help needed standing up from a chair using your arms (e.g., wheelchair or bedside chair)?: A Little Help needed to walk in hospital room?: A Lot Help needed climbing 3-5 steps with a railing? : A Lot 6 Click Score: 16    End of Session Equipment Utilized During Treatment: Gait belt Activity Tolerance: Patient tolerated treatment well Patient left: with call bell/phone within reach;in bed Nurse Communication: Mobility status PT Visit Diagnosis: Muscle weakness (generalized) (M62.81);BPPV;Dizziness and giddiness (R42) BPPV - Right/Left : Left     Time: 1050-1130 PT Time Calculation (min) (ACUTE ONLY): 40 min  Charges:    $Therapeutic Activity: 38-52 mins PT General Charges $$ ACUTE PT VISIT: 1 Visit                    Allison Duffy, DPT, CLT  Acute Rehabilitation Services Office: [phone] (Secure chat preferred)    Allison Duffy 10/17/2023, 11:49 AM

## 2023-10-17 NOTE — ED Notes (Signed)
 Pt eating lunch

## 2023-10-17 NOTE — ED Notes (Signed)
 Neurology at bedside.

## 2023-10-17 NOTE — Progress Notes (Signed)
 SLP Cancellation Note  Patient Details Name: Allison Duffy MRN: [id number] DOB: [birth year]   Cancelled treatment:       Reason Eval/Treat Not Completed: SLP screened, no needs identified. RN reports pt passed a repeat swallow screen and did well with am meal. Pt reports no changes with swallow, communication or cognition. No obvious deficits noted and imaging negative. Will sign off.     Wilder Kin, MA, CCC-SLP Acute Rehabilitation Services Office Number: [phone]  Wilder KANDICE Kin 10/17/2023, 9:01 AM

## 2023-10-17 NOTE — Progress Notes (Addendum)
 [facility] Progress Note  Patient: Allison Duffy    FMW:[id number]  DOA: 10/16/2023     Date of Service: the patient was seen and examined on 10/17/2023  Chief Complaint  Patient presents with   Weakness   Brief hospital course: PEJA ALLENDER is a 89 y.o. female with medical history significant for A-fib recently discontinued anticoagulation, HTN, HLD, chronic diastolic HF, CKD 3B, arthritis, T2DM and CAD who presented to the ED for evaluation of dizziness. Patient reports that she went to the beauty shop and then to the grocery store with her daughter. When they got to the parking lot, she started feeling dizzy and very weak  felt like I was going to die. She was brought to the ED for further evaluation. At bedside, she denies any dizziness at rest but reports very dry mouth and feeling very weak. She denies any chest pain, shortness of breath, vision changes, abdominal pain, nausea, vomiting, dysuria, fevers or chills.  Per daughter, patient's speech seems slightly off.    ED Course: Initial vitals show patient afebrile, HR 60-70s, SBP 140-180s, SpO2 100% on room air. Initial labs significant for creatinine 1.38, glucose 185, normal CBC, UA with no signs of infection, negative ethanol levels and normal INR. EKG shows ectopic atrial rhythm. CXR shows no active disease.  CT head with no acute intracranial abnormality. CTA head and neck shows severe narrowing of the bilateral distal vertebral arteries and the basilar confluence and no LVO. MRI brain with no acute intracranial abnormality. Pt received IV Ativan  1 mg x 1, IV Valium  5 mg x 1.  Neurology was consulted for evaluation. [facility] was consulted for admission.    Assessment and Plan: # Dizziness # Vertebrobasilar insufficiency - Patient presented with acute onset of dizziness and generalized weakness - Patient globally weak but no focal neurodeficits on exam - CTA head and MRI brain does not show any acute intracranial  abnormalities - CTA head and neck with no LVO but shows severe narrowing of the bilateral distal vertebral arteries at the basilar confluence - There is a concern for possible vertebrobasilar insufficiency versus MRI negative stroke - Patient does have history of paroxysmal A-fib however rate controlled so unclear if this is contributing to her presenting symptoms - Neurology following, recommends VBI/stroke/TIA workup - Check orthostatic vitals  - Aspirin  80 mg p.o. daily Neurology recommended to resume Eliquis  2.5 mg p.o. twice daily home dose.  TTE: LVEF 55 to 60%, grade 3 DD, moderate pulm hypertension, severe LA dilatation and echocardiogram - IV LR 1 L bolus followed by 100 cc/h infusion - Follow-up A1c,  lipid panel LDL 23 below goal <70, TSH 0.95 within normal range - PT/OT/SLP eval and treat - Frequent neurochecks - Telemetry   # AKI on CKD 3a Baseline creatinine 1.07, eGFR 48  - Creatinine 1.40 - Patient does look quite dry on exam - IV LR 1 L bolus followed by IV LR 100 cc/hr - Trend renal function and avoid nephrotoxic agents    # Hypokalemia, potassium repleted # Hypomagnesemia, mag repleted. Check electrolytes daily and replete as needed.   # Hypertensive urgency - Permissive hypertension due to possible Acute CVA hold all BP meds for now Gradually resume home medications Held clonidine  due to bradycardia - IV hydralazine  PRN for SBP > 220/110   # Chronic diastolic HF - Last TTE February 2025 showed EF 60 to 65%, mild LVH, G1DD, and severely dilated LA - Patient quite dry on exam, hold Lasix   Repeat TTE as above    # Paroxysmal A-fib - Rate controlled, remains in normal sinus - Per daughter, Eliquis  recently discontinued due to falls - Continue telemetry Continue aspirin  Resumed Eliquis , discussed with patient's daughter. 9/20 bradycardia, continue to monitor on telemetry, avoid AV nodal blockers    # T2DM with hyperglycemia - Last A1c 7.9% 6 months  ago - Blood glucose elevated to 176 on admission - SSI with meals, CBG monitoring - Follow-up repeat A1c   # CAD - Chronic and stable, no chest pain - Continue aspirin  and Ranexa    # HLD - Continue rosuvastatin  -lipid panel LDL 23   Body mass index is 22.58 kg/m.  Interventions:  Diet: Carb modified diet DVT Prophylaxis: Subcutaneous Lovenox    Advance goals of care discussion: DNR-limited  Family Communication: family was not present at bedside, at the time of interview.  The pt provided permission to discuss medical plan with the family. Opportunity was given to ask question and all questions were answered satisfactorily.   Disposition:  Pt is from Home, admitted with dizziness due to possible acute stroke, still has dizziness and hypertension, which precludes a safe discharge. Discharge to home, when stable and cleared by neurology, may need few days to improve.  Subjective: No significant events overnight, patient is still having dizziness but feels improvement.  Denies any weakness or numbness, no chest pain or palpitation, no shortness of breath.  Physical Exam: General: NAD, lying comfortably Appear in no distress, affect appropriate Eyes: PERRLA ENT: Oral Mucosa Clear, moist  Neck: no JVD,  Cardiovascular: S1 and S2 Present, no Murmur,  Respiratory: good respiratory effort, Bilateral Air entry equal and Decreased, no Crackles, no wheezes Abdomen: Bowel Sound present, Soft and no tenderness,  Skin: no rashes Extremities: no Pedal edema, no calf tenderness Neurologic: without any new focal findings Gait not checked due to patient safety concerns  Vitals:   10/17/23 1245 10/17/23 1315 10/17/23 1345 10/17/23 1415  BP: (!) 186/90 (!) 200/91 (!) 199/78 (!) 197/76  Pulse: 70 70 (!) 28 69  Resp: 15 16 18 19   Temp: 97.9 F (36.6 C)     TempSrc: Oral     SpO2: 100% 100% 99% 97%  Weight:      Height:       No intake or output data in the 24 hours ending 10/17/23  1445 Filed Weights   10/16/23 1229  Weight: 65.8 kg    Data Reviewed: I have personally reviewed and interpreted daily labs, tele strips, imagings as discussed above. I reviewed all nursing notes, pharmacy notes, vitals, pertinent old records I have discussed plan of care as described above with RN and patient/family.  CBC: Recent Labs  Lab 10/16/23 1243 10/16/23 1305 10/17/23 0534  WBC 6.8  --  7.9  NEUTROABS 4.8  --   --   HGB 12.6 12.2 11.3*  HCT 36.9 36.0 32.3*  MCV 98.7  --  96.4  PLT 195  --  176   Basic Metabolic Panel: Recent Labs  Lab 10/16/23 1243 10/16/23 1305 10/17/23 0233 10/17/23 0534  NA 138 140  --  138  K 3.6 3.7  --  3.3*  CL 104 106  --  102  CO2 21*  --   --  24  GLUCOSE 176* 185*  --  137*  BUN 27* 29*  --  24*  CREATININE 1.38* 1.40*  --  1.07*  CALCIUM  9.3  --   --  8.8*  MG  --   --  1.5*  --   PHOS  --   --  3.9  --     Studies: ECHOCARDIOGRAM COMPLETE Result Date: 10/17/2023    ECHOCARDIOGRAM REPORT   Patient Name:   SHERIAN VALENZA Date of Exam: 10/17/2023 Medical Rec #:  [id number]        Height:       67.2 in Accession #:    [id number]       Weight:       145.0 lb Date of Birth:  Nov 18, 1926        BSA:          1.768 m Patient Age:    89 years         BP:           172/78 mmHg Patient Gender: F                HR:           70 bpm. Exam Location:  Inpatient Procedure: 2D Echo (Both Spectral and Color Flow Doppler were utilized during            procedure). Indications:    stroke  History:        Patient has prior history of Echocardiogram examinations, most                 recent 03/27/2023. CAD, chronic kidney disease; Risk                 Factors:Diabetes and Hypertension.  Sonographer:    Tinnie Barefoot RDCS Referring Phys: [id number] PROSPER M AMPONSAH IMPRESSIONS  1. Left ventricular ejection fraction, by estimation, is 55 to 60%. The left ventricle has normal function. The left ventricle has no regional wall motion abnormalities. There is  mild concentric left ventricular hypertrophy. Left ventricular diastolic parameters are consistent with Grade III diastolic dysfunction (restrictive).  2. Right ventricular systolic function is normal. The right ventricular size is normal. There is moderately elevated pulmonary artery systolic pressure. The estimated right ventricular systolic pressure is 51.0 mmHg.  3. Left atrial size was severely dilated.  4. The mitral valve is normal in structure. Mild to moderate mitral valve regurgitation. No evidence of mitral stenosis.  5. Tricuspid valve regurgitation is mild to moderate.  6. The aortic valve is tricuspid. There is mild calcification of the aortic valve. Aortic valve regurgitation is not visualized. Aortic valve sclerosis/calcification is present, without any evidence of aortic stenosis.  7. The inferior vena cava is dilated in size with <50% respiratory variability, suggesting right atrial pressure of 15 mmHg. Conclusion(s)/Recommendation(s): The rhythm is regular. Interpretation of diastolic function assumes that the patient is in normal sinus rhythm. Cannot exclude that the current rhythm is atrial flutter with 4:1 AV block, in which case diastolic function cannot be appropriately interpreted. FINDINGS  Left Ventricle: Left ventricular ejection fraction, by estimation, is 55 to 60%. The left ventricle has normal function. The left ventricle has no regional wall motion abnormalities. The left ventricular internal cavity size was normal in size. There is  mild concentric left ventricular hypertrophy. Left ventricular diastolic parameters are consistent with Grade III diastolic dysfunction (restrictive). Right Ventricle: The right ventricular size is normal. No increase in right ventricular wall thickness. Right ventricular systolic function is normal. There is moderately elevated pulmonary artery systolic pressure. The tricuspid regurgitant velocity is 3.28 m/s, and with an assumed right atrial pressure  of 8 mmHg, the estimated right ventricular systolic pressure is 51.0 mmHg. Left Atrium:  Left atrial size was severely dilated. Right Atrium: Right atrial size was normal in size. Pericardium: There is no evidence of pericardial effusion. Mitral Valve: The mitral valve is normal in structure. Mild mitral annular calcification. Mild to moderate mitral valve regurgitation, with centrally-directed jet. No evidence of mitral valve stenosis. Tricuspid Valve: The tricuspid valve is normal in structure. Tricuspid valve regurgitation is mild to moderate. Aortic Valve: The aortic valve is tricuspid. There is mild calcification of the aortic valve. Aortic valve regurgitation is not visualized. Aortic valve sclerosis/calcification is present, without any evidence of aortic stenosis. Pulmonic Valve: The pulmonic valve was grossly normal. Pulmonic valve regurgitation is trivial. No evidence of pulmonic stenosis. Aorta: The aortic root and ascending aorta are structurally normal, with no evidence of dilitation. Venous: The inferior vena cava is dilated in size with less than 50% respiratory variability, suggesting right atrial pressure of 15 mmHg. IAS/Shunts: No atrial level shunt detected by color flow Doppler.  LEFT VENTRICLE PLAX 2D LVIDd:         4.80 cm   Diastology LVIDs:         3.30 cm   LV e' medial:    5.98 cm/s LV PW:         1.20 cm   LV E/e' medial:  19.7 LV IVS:        1.20 cm   LV e' lateral:   13.40 cm/s LVOT diam:     2.10 cm   LV E/e' lateral: 8.8 LV SV:         58 LV SV Index:   33 LVOT Area:     3.46 cm  RIGHT VENTRICLE          IVC RV Basal diam:  3.05 cm  IVC diam: 2.20 cm TAPSE (M-mode): 1.6 cm LEFT ATRIUM              Index        RIGHT ATRIUM           Index LA diam:        4.70 cm  2.66 cm/m   RA Area:     15.40 cm LA Vol (A2C):   95.9 ml  54.25 ml/m  RA Volume:   41.30 ml  23.36 ml/m LA Vol (A4C):   101.0 ml 57.14 ml/m LA Biplane Vol: 100.0 ml 56.57 ml/m  AORTIC VALVE             PULMONIC VALVE  LVOT Vmax:   77.10 cm/s  PR End Diast Vel: 5.29 msec LVOT Vmean:  50.100 cm/s LVOT VTI:    0.167 m  AORTA Ao Root diam: 3.00 cm Ao Asc diam:  3.10 cm MITRAL VALVE                TRICUSPID VALVE MV Area (PHT): 5.13 cm     TR Peak grad:   43.0 mmHg MV Decel Time: 148 msec     TR Vmax:        328.00 cm/s MR Peak grad: 144.5 mmHg MR Mean grad: 99.0 mmHg     SHUNTS MR Vmax:      601.00 cm/s   Systemic VTI:  0.17 m MR Vmean:     477.0 cm/s    Systemic Diam: 2.10 cm MV E velocity: 118.00 cm/s MV A velocity: 39.20 cm/s MV E/A ratio:  3.01 Mihai Croitoru MD Electronically signed by Jerel Balding MD Signature Date/Time: 10/17/2023/1:49:48 PM    Final    DG Chest  2 View Result Date: 10/16/2023 CLINICAL DATA:  Dizziness EXAM: CHEST - 2 VIEW COMPARISON:  03/23/2023 FINDINGS: Frontal and lateral views of the chest demonstrate an unremarkable cardiac silhouette. Stable mitral annular calcifications and atherosclerosis of the aortic arch. Stable chronic pulmonary vascular congestion. No airspace disease, effusion, or pneumothorax. No acute bony abnormalities. Bilateral shoulder arthroplasties. IMPRESSION: 1. Chronic pulmonary vascular congestion without overt edema. Electronically Signed   By: Ozell Daring M.D.   On: 10/16/2023 15:21    Scheduled Meds:  aspirin  EC  81 mg Oral Daily   enoxaparin  (LOVENOX ) injection  30 mg Subcutaneous Q24H   insulin  aspart  0-15 Units Subcutaneous TID WC   insulin  aspart  0-5 Units Subcutaneous QHS   meclizine   25 mg Oral Once   ranolazine   500 mg Oral BID   rosuvastatin   20 mg Oral QHS   Continuous Infusions: PRN Meds: acetaminophen  **OR** acetaminophen , bisacodyl , hydrALAZINE , meclizine , ondansetron  **OR** ondansetron  (ZOFRAN ) IV, senna-docusate  Time spent: 55 minutes  Author: ELVAN SOR. MD [facility] 10/17/2023 2:45 PM  To reach On-call, see care teams to locate the attending and reach out to them via [email or web address]. If 7PM-7AM, please contact  night-coverage If you still have difficulty reaching the attending provider, please page the [facility] (Director on Call) for [facility] on amion for assistance.

## 2023-10-17 NOTE — ED Notes (Signed)
 Pt heart rate dropped to 55 and rhythm became irregular. ELG captured. Pt is not having any symptoms. MD Von advised via secure chat.

## 2023-10-17 NOTE — Evaluation (Signed)
 Occupational Therapy Evaluation Patient Details Name: Allison Duffy MRN: [id number] DOB: 22-Dec-1926 Today's Date: 10/17/2023   History of Present Illness   Allison Duffy is a 89 y.o. female admitted 9/19 with acute onset of dizziness. workup for UTI.  PMH: acute on chronic combined systolic and diastolic CHF, arthritis, chest pain, hyperlipidemia, hypertension, type 2 diabetes, A-fib previously on Eliquis  discontinued medication approximately 2 weeks ago according to patient's daughter due to 2 recent falls     Clinical Impressions PTA pt lives alone @ modified independent level. Pt had a recent fall 3 weeks ago and c/o dizziness during session regardless of position. Pt presents as a significant fall risk, requires min A with mobility and mod A with ADL tasks at RW level due to below listed deficits. Patient will benefit from continued inpatient follow up therapy, <3 hours/day to maximize functional level of independence with goal of returning home. Acute OT to follow. BP 219/100 at end of session - nsg aware.  Acute OT to follow.      If plan is discharge home, recommend the following:   A little help with walking and/or transfers;A lot of help with bathing/dressing/bathroom     Functional Status Assessment   Patient has had a recent decline in their functional status and demonstrates the ability to make significant improvements in function in a reasonable and predictable amount of time.     Equipment Recommendations   BSC/3in1     Recommendations for Other Services         Precautions/Restrictions   Precautions Precautions: Fall Recall of Precautions/Restrictions: Intact Restrictions Weight Bearing Restrictions Per Provider Order: No     Mobility Bed Mobility Overal bed mobility: Needs Assistance         Sit to supine: Contact guard assist        Transfers Overall transfer level: Needs assistance Equipment used: Rolling walker (2  wheels) Transfers: Sit to/from Stand, Bed to chair/wheelchair/BSC Sit to Stand: Min assist     Step pivot transfers: Min assist     General transfer comment: Min A for safety/balance and stability.      Balance Overall balance assessment: Needs assistance Sitting-balance support: Bilateral upper extremity supported, Feet unsupported Sitting balance-Leahy Scale: Fair     Standing balance support: Bilateral upper extremity supported, During functional activity, Reliant on assistive device for balance Standing balance-Leahy Scale: Poor                             ADL either performed or assessed with clinical judgement   ADL Overall ADL's : Needs assistance/impaired Eating/Feeding: Modified independent   Grooming: Set up;Sitting   Upper Body Bathing: Set up;Sitting   Lower Body Bathing: Moderate assistance;Sit to/from stand   Upper Body Dressing : Set up;Sitting   Lower Body Dressing: Moderate assistance;Sit to/from stand   Toilet Transfer: Minimal assistance;Ambulation   Toileting- Clothing Manipulation and Hygiene: Moderate assistance       Functional mobility during ADLs: Minimal assistance;Rolling walker (2 wheels)       Vision Baseline Vision/History: 1 Wears glasses Ability to See in Adequate Light: 0 Adequate       Perception         Praxis         Pertinent Vitals/Pain       Extremity/Trunk Assessment Upper Extremity Assessment Upper Extremity Assessment: Generalized weakness;LUE deficits/detail;Right hand dominant LUE Deficits / Details: LUE shoulder/elbow and hand ROM deficits form  previous injury; shoulder ROM limited to # 30 degrees; elbow lacks ext but funcitonal; loss of ROM in digits however uses a limited pinch pattern LUE Coordination: decreased fine motor;decreased gross motor   Lower Extremity Assessment Lower Extremity Assessment: Defer to PT evaluation   Cervical / Trunk Assessment Cervical / Trunk Assessment:  Kyphotic   Communication Communication Communication: No apparent difficulties Factors Affecting Communication: Hearing impaired   Cognition Arousal: Alert Behavior During Therapy: WFL for tasks assessed/performed Cognition: No family/caregiver present to determine baseline (most likely at baseline)                               Following commands: Intact       Cueing  General Comments      BP 219/100 end of session - nsg aware   Exercises     Shoulder Instructions      Home Living Family/patient expects to be discharged to:: Private residence Living Arrangements: Alone Available Help at Discharge: Family;Available PRN/intermittently Type of Home: House Home Access: Stairs to enter [employer] of Steps: 5 Entrance Stairs-Rails: Right;Left Home Layout: One level     Bathroom Shower/Tub: Chief Strategy Officer: Handicapped height Bathroom Accessibility: Yes How Accessible: Accessible via walker Home Equipment: Rolling Walker (2 wheels);Rollator (4 wheels);Tub bench;Grab bars - tub/shower;Cane - quad;Hand held shower head   Additional Comments: Pt from home and independent baseline      Prior Functioning/Environment Prior Level of Function : Independent/Modified Independent;Driving             Mobility Comments: used primarily  RW or cane without assist PTA ADLs Comments: No driving, light cooking, does laundry and dishes, B/D self; daughter sets up pillbox adn does finances; daughter/neighbors provide most meals; daughter goes by daily to check on her    OT Problem List: Decreased strength;Decreased range of motion;Decreased activity tolerance;Impaired balance (sitting and/or standing);Decreased safety awareness;Decreased knowledge of use of DME or AE;Cardiopulmonary status limiting activity   OT Treatment/Interventions: Self-care/ADL training;Therapeutic exercise;Energy conservation;DME and/or AE instruction;Therapeutic  activities;Patient/family education;Balance training      OT Goals(Current goals can be found in the care plan section)   Acute Rehab OT Goals Patient Stated Goal: get stronger OT Goal Formulation: With patient Time For Goal Achievement: 10/31/23 Potential to Achieve Goals: Good   OT Frequency:  Min 2X/week    Co-evaluation              AM-PAC OT 6 Clicks Daily Activity     Outcome Measure Help from another person eating meals?: None Help from another person taking care of personal grooming?: A Little Help from another person toileting, which includes using toliet, bedpan, or urinal?: A Lot Help from another person bathing (including washing, rinsing, drying)?: A Lot Help from another person to put on and taking off regular upper body clothing?: A Little Help from another person to put on and taking off regular lower body clothing?: A Lot 6 Click Score: 16   End of Session Equipment Utilized During Treatment: Gait belt;Rolling walker (2 wheels) Nurse Communication: Mobility status  Activity Tolerance: Patient tolerated treatment well Patient left: in bed;with call bell/phone within reach  OT Visit Diagnosis: Unsteadiness on feet (R26.81);Other abnormalities of gait and mobility (R26.89);Muscle weakness (generalized) (M62.81);History of falling (Z91.81);Other symptoms and signs involving the nervous system (R29.898);Dizziness and giddiness (R42)                Time:  8866-8850 OT Time Calculation (min): 16 min Charges:  OT General Charges $OT Visit: 1 Visit OT Evaluation $OT Eval Moderate Complexity: 1 Mod  Onie Kasparek, OT/L   Acute OT Clinical Specialist Acute Rehabilitation Services Pager [phone] Office [phone]   [facility] 10/17/2023, 11:58 AM

## 2023-10-17 NOTE — ED Notes (Signed)
Patient transported to Echo.

## 2023-10-17 NOTE — Progress Notes (Addendum)
 STROKE TEAM PROGRESS NOTE   INTERIM HISTORY/SUBJECTIVE  No focal neurodeficit on exam, strength equal bilaterally.  She is alert and oriented.  No complaints of dizziness today.  Echo is still pending She states her Eliquis  was stopped about a month ago after a fall, she is currently taking a baby aspirin . MRI is negative likely vertebrobasilar insufficiency due to narrowing of her vertebral arteries.  Blood pressure is stable  OBJECTIVE  CBC    Component Value Date/Time   WBC 7.9 10/17/2023 0534   RBC 3.35 (L) 10/17/2023 0534   HGB 11.3 (L) 10/17/2023 0534   HCT 32.3 (L) 10/17/2023 0534   PLT 176 10/17/2023 0534   MCV 96.4 10/17/2023 0534   MCH 33.7 10/17/2023 0534   MCHC 35.0 10/17/2023 0534   RDW 11.8 10/17/2023 0534   LYMPHSABS 1.4 10/16/2023 1243   MONOABS 0.4 10/16/2023 1243   EOSABS 0.1 10/16/2023 1243   BASOSABS 0.1 10/16/2023 1243    BMET    Component Value Date/Time   NA 138 10/17/2023 0534   NA 140 08/27/2022 1041   K 3.3 (L) 10/17/2023 0534   CL 102 10/17/2023 0534   CO2 24 10/17/2023 0534   GLUCOSE 137 (H) 10/17/2023 0534   BUN 24 (H) 10/17/2023 0534   BUN 28 08/27/2022 1041   CREATININE 1.07 (H) 10/17/2023 0534   CALCIUM  8.8 (L) 10/17/2023 0534   EGFR 34 (L) 08/27/2022 1041   GFRNONAA 48 (L) 10/17/2023 0534    IMAGING past 24 hours DG Chest 2 View Result Date: 10/16/2023 CLINICAL DATA:  Dizziness EXAM: CHEST - 2 VIEW COMPARISON:  03/23/2023 FINDINGS: Frontal and lateral views of the chest demonstrate an unremarkable cardiac silhouette. Stable mitral annular calcifications and atherosclerosis of the aortic arch. Stable chronic pulmonary vascular congestion. No airspace disease, effusion, or pneumothorax. No acute bony abnormalities. Bilateral shoulder arthroplasties. IMPRESSION: 1. Chronic pulmonary vascular congestion without overt edema. Electronically Signed   By: Allison Duffy M.D.   On: 10/16/2023 15:21   MR BRAIN WO CONTRAST Result Date:  10/16/2023 EXAM: MRI BRAIN WITHOUT CONTRAST 10/16/2023 01:41:53 PM TECHNIQUE: Multiplanar multisequence MRI of the head/brain was performed without the administration of intravenous contrast. COMPARISON: CT head and CTA head and neck earlier same day. CLINICAL HISTORY: Neuro deficit, acute, stroke suspected. FINDINGS: BRAIN AND VENTRICLES: No acute infarct. No intracranial hemorrhage. No mass. No midline shift. No hydrocephalus. The sella is unremarkable. Normal flow voids. T2 and FLAIR hyperintensity in the periventricular and subcortical white matter compatible with moderate chronic microvascular ischemic changes. Generalized parenchymal volume loss. Remote infarcts in the left cerebellum. ORBITS: Bilateral lens replacement. SINUSES AND MASTOIDS: No acute abnormality. BONES AND SOFT TISSUES: Normal marrow signal. No acute soft tissue abnormality. IMPRESSION: 1. No acute intracranial abnormality. 2. Moderate chronic microvascular ischemic disease and generalized parenchymal volume loss. 3. Remote left cerebellar infarcts. Electronically signed by: Allison Mania MD 10/16/2023 01:59 PM EDT RP Workstation: [id number]   CT ANGIO HEAD NECK W WO CM (CODE STROKE) Result Date: 10/16/2023 EXAM: CTA HEAD AND NECK WITHOUT AND WITH 10/16/2023 01:06:31 PM TECHNIQUE: CTA of the head and neck was performed with and without the administration of intravenous contrast. Multiplanar 2D and/or 3D reformatted images are provided for review. Automated exposure control, iterative reconstruction, and/or weight based adjustment of the mA/kV was utilized to reduce the radiation dose to as low as reasonably achievable. Stenosis of the internal carotid arteries measured using NASCET criteria. COMPARISON: Head CT 10/16/2023 CLINICAL HISTORY: Neuro deficit, acute, stroke suspected.  Code stroke FINDINGS: CTA NECK: AORTIC ARCH AND ARCH VESSELS: Atherosclerotic calcifications of the aortic arch and arch vessel origins, which remain patent. No  dissection or arterial injury. No significant stenosis of the brachiocephalic or subclavian arteries. CERVICAL CAROTID ARTERIES: No dissection, arterial injury, or hemodynamically significant stenosis by NASCET criteria. CERVICAL VERTEBRAL ARTERIES: Severe narrowing of the bilateral distal vertebral arteries at the basilar confluence. The vertebral arteries are patent proximally. No dissection or arterial injury. LUNGS AND MEDIASTINUM: Unremarkable. SOFT TISSUES: No acute abnormality. BONES: No acute abnormality. CTA HEAD: ANTERIOR CIRCULATION: Extensive calcified plaque of the bilateral carotid siphons resulting in at least mild stenosis of the bilateral supraclinoid segments. The anterior and middle cerebral arteries are patent proximally without significant stenosis. No aneurysm. POSTERIOR CIRCULATION: Severe narrowing of the bilateral distal vertebral arteries at the basilar confluence. No significant stenosis of the posterior cerebral arteries. No significant stenosis of the basilar artery. No aneurysm. OTHER: No dural venous sinus thrombosis on this non-dedicated study. IMPRESSION: 1. No large vessel occlusion. 2. Severe narrowing of the bilateral distal vertebral arteries at the basilar confluence; vertebral arteries remain patent proximally. 3. Extensive calcified plaque of the bilateral carotid siphons with at least mild stenosis of the bilateral supraclinoid ICA segments. Code stroke results were communicated to Dr. Arora at 1:17 pm on 10/16/2023 by secure text paging. Electronically signed by: Allison Chess MD 10/16/2023 01:18 PM EDT RP Workstation: [id number]   CT HEAD CODE STROKE WO CONTRAST Result Date: 10/16/2023 EXAM: CT HEAD WITHOUT CONTRAST 10/16/2023 12:50:45 PM TECHNIQUE: CT of the head was performed without the administration of intravenous contrast. Automated exposure control, iterative reconstruction, and/or weight based adjustment of the mA/kV was utilized to reduce the radiation dose to  as low as reasonably achievable. COMPARISON: 09/08/2023 CLINICAL HISTORY: Neuro deficit, acute, stroke suspected. Left side weakness, aphasia; LKW 1100; Dr voncile FINDINGS: BRAIN AND VENTRICLES: Unchanged old infarct in the inferior aspect of the left cerebellar hemisphere. Stable background of moderate chronic small vessel disease, greater within the right frontal subcortical and deep cerebral white matter. No new loss of gray-white differentiation. No acute hemorrhage. No evidence of acute infarct. No hydrocephalus. No extra-axial collection. No mass effect or midline shift. ORBITS: No acute abnormality. SINUSES: No acute abnormality. SOFT TISSUES AND SKULL: No acute soft tissue abnormality. No skull fracture. [place] Stroke Program Early CT (ASPECT) Score: Ganglionic (caudate, IC, lentiform nucleus, insula, M1-M3): 7 Supraganglionic (M4-M6): 3 Total: 10 IMPRESSION: 1. No acute intracranial abnormality. ASPECT score: 10. 2. Stable background of moderate chronic small vessel disease, greater within the right frontal subcortical and deep cerebral white matter. Code stroke results were communicated to Dr. Arora at 1:03 pm on 10/16/2023 by secure messaging. Electronically signed by: Allison Chess MD 10/16/2023 01:03 PM EDT RP Workstation: [id number]    Vitals:   10/17/23 0445 10/17/23 0545 10/17/23 0629 10/17/23 0800  BP: (!) 195/81 (!) 193/81 (!) 191/82 (!) 190/82  Pulse: 69 68 68 69  Resp: 12 14 16 16   Temp:  97.9 F (36.6 C)    TempSrc:  Oral    SpO2: 97% 94% 95% 100%  Weight:      Height:         PHYSICAL EXAM General:  Alert, well-nourished, well-developed patient in no acute distress Psych:  Mood and affect appropriate for situation CV: Regular rate and rhythm on monitor Respiratory:  Regular, unlabored respirations on room air GI: Abdomen soft and nontender   NEURO:  Mental Status: AA&Ox3, patient is able to  give clear and coherent history Speech/Language: speech is without dysarthria  or aphasia.  Naming, repetition, fluency, and comprehension intact.  Cranial Nerves:  II: PERRL. Visual fields full.  III, IV, VI: EOMI. Eyelids elevate symmetrically.  V: Sensation is intact to light touch and symmetrical to face.  VII: Face is symmetrical resting and smiling VIII: hearing intact to voice. IX, X: Palate elevates symmetrically. Phonation is normal.  KP:Dynloizm shrug 5/5. XII: tongue is midline without fasciculations. Motor: 5/5 strength to all muscle groups tested.  Tone: is normal and bulk is normal Sensation- Intact to light touch bilaterally. Extinction absent to light touch to DSS.   Coordination: FTN intact bilaterally, HKS: no ataxia in BLE.No drift.  Gait- deferred  Most Recent NIH 0     ASSESSMENT/PLAN  Ms. Allison Duffy is a 89 y.o. female with history of acute on chronic combined systolic and diastolic CHF, arthritis, chest pain, hyperlipidemia, hypertension, type 2 diabetes, A-fib previously on Eliquis  discontinued medication approximately 2 weeks ago according to patient's daughter due to 2 recent falls presents to the ED 9/19 for evaluation of acute onset of dizziness.  NIH on Admission 5.  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 ASPECTS 10 CTA head & neck Severe narrowing of the bilateral distal vertebral arteries at the basilar confluence; vertebral arteries remain patent proximally. MRI no acute intracranial abnormality 2D Echo pending LDL 23 HgbA1c 7.9 VTE prophylaxis -Lovenox  aspirin  81 mg daily prior to admission, now on aspirin  81 mg daily  Was taken off of Eliquis  after a fall last month, from a neurological perspective she may resume the Eliquis  however this can be determined by the primary team Therapy recommendations: SNF Disposition:  Pending competition of work up   Atrial fibrillation Home Meds:  Continue telemetry monitoring She was previously on Eliquis  however this was  discontinued after a fall.  From a neurological perspective she may resume Eliquis , however this can be determined by the primary team based on her fall risk  Combined systolic and diastolic heart failure Hypertension Home meds: Clonidine , Lasix , hydralazine , isosorbide  dinitrate, Januvia Stable Normotension, avoid hypotension in the setting of severe narrowing of her vertebral arteries  Hyperlipidemia Home meds: Crestor  20 mg, resumed in hospital LDL 23, goal < 70 Continue statin at discharge  Diabetes type II Uncontrolled Home meds: Metformin , Januvia HgbA1c 7.9, goal < 7.0 CBGs SSI Recommend close follow-up with PCP for better DM control  Other Active Problems Arthritis CKD 3B  Hospital day # 1  Patient seen and examined by NP/APP with MD. MD to update note as needed.   Jorene Last, DNP, FNP-BC [facility] Pager: [phone]   ATTENDING NOTE: I reviewed above note and agree with the assessment and plan. Pt was seen and examined.   ATTENDING NOTE: I reviewed above note and agree with the assessment and plan. Pt was seen and examined.  Together with the NP/PA, we formulated the assessment and plan of care which reflects our mutual decision.  I have made any additions or clarifications directly to the above note and agree with the findings and plan as currently documented. She is back to baseline, MRI negative for acute stroke. May resume anticoagulation. Patient will follow up with outpatient PCP. Will sign off, please call us  for any additional questions, or concerns.  Spent a total of 35 minutes dedicated to the care of this patient.     For detailed assessment and plan, please refer  to above as I have made changes wherever appropriate.   Pastor Falling, MD  Stroke Neurology 10/17/2023 1:54 PM    To contact Stroke Continuity provider, please refer to [email or web address]. After hours, contact General Neurology

## 2023-10-17 NOTE — ED Notes (Signed)
 Pt ambulated to restroom with assistance.

## 2023-10-17 NOTE — Progress Notes (Signed)
  Echocardiogram 2D Echocardiogram has been performed.  LAMON MAXWELL 10/17/2023, 10:49 AM

## 2023-10-17 NOTE — ED Notes (Addendum)
 Pt ambulated to the restroom with assistance. PT now at bedside

## 2023-10-18 ENCOUNTER — Other Ambulatory Visit: Payer: Self-pay

## 2023-10-18 DIAGNOSIS — R42 Dizziness and giddiness: Secondary | ICD-10-CM | POA: Diagnosis not present

## 2023-10-18 LAB — CBC
HCT: 35 % — ABNORMAL LOW (ref 36.0–46.0)
Hemoglobin: 12.4 g/dL (ref 12.0–15.0)
MCH: 33.6 pg (ref 26.0–34.0)
MCHC: 35.4 g/dL (ref 30.0–36.0)
MCV: 94.9 fL (ref 80.0–100.0)
Platelets: 200 K/uL (ref 150–400)
RBC: 3.69 MIL/uL — ABNORMAL LOW (ref 3.87–5.11)
RDW: 11.7 % (ref 11.5–15.5)
WBC: 8.5 K/uL (ref 4.0–10.5)
nRBC: 0 % (ref 0.0–0.2)

## 2023-10-18 LAB — MAGNESIUM: Magnesium: 1.9 mg/dL (ref 1.7–2.4)

## 2023-10-18 LAB — BASIC METABOLIC PANEL WITH GFR
Anion gap: 9 (ref 5–15)
BUN: 23 mg/dL (ref 8–23)
CO2: 27 mmol/L (ref 22–32)
Calcium: 9.3 mg/dL (ref 8.9–10.3)
Chloride: 105 mmol/L (ref 98–111)
Creatinine, Ser: 1.25 mg/dL — ABNORMAL HIGH (ref 0.44–1.00)
GFR, Estimated: 40 mL/min — ABNORMAL LOW (ref >60)
Glucose, Bld: 162 mg/dL — ABNORMAL HIGH (ref 70–99)
Potassium: 3.4 mmol/L — ABNORMAL LOW (ref 3.5–5.1)
Sodium: 141 mmol/L (ref 135–145)

## 2023-10-18 LAB — GLUCOSE, CAPILLARY
Glucose-Capillary: 182 mg/dL — ABNORMAL HIGH (ref 70–99)
Glucose-Capillary: 229 mg/dL — ABNORMAL HIGH (ref 70–99)
Glucose-Capillary: 178 mg/dL — ABNORMAL HIGH (ref 70–99)
Glucose-Capillary: 163 mg/dL — ABNORMAL HIGH (ref 70–99)

## 2023-10-18 LAB — PHOSPHORUS: Phosphorus: 4.1 mg/dL (ref 2.5–4.6)

## 2023-10-18 MED ORDER — POTASSIUM CHLORIDE CRYS ER 20 MEQ PO TBCR
40.0000 meq | EXTENDED_RELEASE_TABLET | Freq: Once | ORAL | Status: AC
  Administered 2023-10-18: 40 meq via ORAL
  Filled 2023-10-18: qty 2

## 2023-10-18 MED ORDER — MAGNESIUM SULFATE IN D5W 1-5 GM/100ML-% IV SOLN
1.0000 g | Freq: Once | INTRAVENOUS | Status: AC
  Administered 2023-10-18: 1 g via INTRAVENOUS
  Filled 2023-10-18: qty 100

## 2023-10-18 MED ORDER — ATORVASTATIN CALCIUM 80 MG PO TABS
80.0000 mg | ORAL_TABLET | Freq: Every day | ORAL | Status: DC
Start: 2023-10-18 — End: 2023-10-20
  Administered 2023-10-18: 80 mg via ORAL
  Filled 2023-10-18: qty 1

## 2023-10-18 MED ORDER — POTASSIUM CHLORIDE CRYS ER 20 MEQ PO TBCR
20.0000 meq | EXTENDED_RELEASE_TABLET | Freq: Once | ORAL | Status: AC
  Administered 2023-10-18: 20 meq via ORAL
  Filled 2023-10-18: qty 1

## 2023-10-18 NOTE — Plan of Care (Signed)
  Problem: Education: Goal: Knowledge of disease or condition will improve Outcome: Progressing   Problem: Education: Goal: Ability to describe self-care measures that may prevent or decrease complications (Diabetes Survival Skills Education) will improve Outcome: Progressing   Problem: Coping: Goal: Ability to adjust to condition or change in health will improve Outcome: Progressing   Problem: Skin Integrity: Goal: Risk for impaired skin integrity will decrease Outcome: Progressing

## 2023-10-18 NOTE — Plan of Care (Signed)

## 2023-10-18 NOTE — TOC CM/SW Note (Signed)
 Pt recommended for SNF. Pt accepting of rec, Pt states that she will only go to [facility] where she had been recently. Pt states if she cannot go to [facility], she wants to go home. PASRR obtained and FL2 completed. SNF ref sent to [facility] and a few other SNF as backup; awaiting bed offers.

## 2023-10-18 NOTE — Progress Notes (Signed)
 PROGRESS NOTE  NYJAI GRAFF  FMW:[id number] DOB: 16-Nov-1926 DOA: 10/16/2023 PCP: Shepard Ade, MD  Consultants  Brief Narrative:  89 y.o. female with medical history significant for A-fib recently discontinued anticoagulation, HTN, HLD, chronic diastolic HF, CKD 3B, arthritis, T2DM and CAD who presented to the ED for evaluation of dizziness. Patient reports that she went to the beauty shop and then to the grocery store with her daughter. When they got to the parking lot, she started feeling dizzy and very weak  felt like I was going to die. She was brought to the ED for further evaluation. At bedside, she denies any dizziness at rest but reports very dry mouth and feeling very weak. She denies any chest pain, shortness of breath, vision changes, abdominal pain, nausea, vomiting, dysuria, fevers or chills.  Per daughter, patient's speech seems slightly off.    ED Course: Initial vitals show patient afebrile, HR 60-70s, SBP 140-180s, SpO2 100% on room air. Initial labs significant for creatinine 1.38, glucose 185, normal CBC, UA with no signs of infection, negative ethanol levels and normal INR. EKG shows ectopic atrial rhythm. CXR shows no active disease.  CT head with no acute intracranial abnormality. CTA head and neck shows severe narrowing of the bilateral distal vertebral arteries and the basilar confluence and no LVO. MRI brain with no acute intracranial abnormality. Pt received IV Ativan  1 mg x 1, IV Valium  5 mg x 1.  Neurology was consulted for evaluation. TRH was consulted for admission.    Assessment & Plan: # Dizziness # Vertebrobasilar insufficiency - Patient presented with acute onset of dizziness and generalized weakness -CTA/MRI brain showed no acute stroke but did show severe narrowing of the bilateral distal vertebral arteries at the basilar confluence - Neurology has been following, signed off 9/20 Neurology recommended to resume Eliquis  2.5 mg p.o. twice daily home dose.   TTE: LVEF 55 to 60%, grade 3 DD, moderate pulm hypertension, severe LA dilatation -Overall much improved and back to her room baseline.  Daughter is concerned about left arm strength which was addressed at prior rehab visit.  Will continue with PT for this as well   # AKI on CKD 3a -Baseline creatinine appears to be anywhere from 1.2-1.5 over the past year. - Creatinine 1.25 today, back to her baseline.    # Hypokalemia - Remains low today, replaced  # Hypomagnesemia, mag repleted. Check electrolytes daily and replace as needed  # Hypertensive urgency - Now resolved. - Back on majority of her home blood pressure medications. Held clonidine  due to bradycardia and risk for rebound hypertension - IV hydralazine  PRN for SBP > 220/110   # Chronic diastolic HF - Last TTE February 2025 showed EF 60 to 65%, mild LVH, G1DD, and severely dilated LA - Patient quite dry on exam, hold Lasix  Repeat TTE as above   # Paroxysmal A-fib - Rate controlled, remains in normal sinus - Per daughter, Eliquis  recently discontinued due to falls--> resumed this admission after being discussed with patient's daughter   # T2DM with hyperglycemia - Last A1c 7.9% 6 months ago - Blood glucose elevated to 176 on admission - SSI with meals, CBG monitoring - Follow-up repeat A1c   # CAD - Chronic and stable, no chest pain - Continue aspirin  and Ranexa    # HLD - Continue rosuvastatin  -lipid panel LDL 23       Wound 10/17/23 1556 Pressure Injury Sacrum Medial Stage 2 -  Partial thickness loss of dermis presenting as  a shallow open injury with a red, pink wound bed without slough. (Active)   DVT prophylaxis:  apixaban  (ELIQUIS ) tablet 2.5 mg Start: 10/17/23 1730 apixaban  (ELIQUIS ) tablet 2.5 mg  Code Status:   Code Status: Limited: Do not attempt resuscitation (DNR) -DNR-LIMITED -Do Not Intubate/DNI  Family Communication: Daughter at bedside, discussed in detail Level of care: Progressive Status is:  Inpatient Dispo: PT/OT recommending SNF at discharge.  TOC working with patient   Consults called: Neurology, now signed off  Subjective: Patient feels awake and alert.  No concerns today.  Feels she is close to if not back at her baseline  Objective: Vitals:   10/17/23 2335 10/18/23 0324 10/18/23 0819 10/18/23 1119  BP: 139/74 (!) 151/76 (!) 156/70 (!) 160/77  Pulse: 81 71 79 74  Resp: 15 18 16 17   Temp: 98.5 F (36.9 C) 98.5 F (36.9 C) 97.9 F (36.6 C) 97.8 F (36.6 C)  TempSrc: Oral Oral    SpO2: 97% 94% 99% 96%  Weight:      Height:        Intake/Output Summary (Last 24 hours) at 10/18/2023 1516 Last data filed at 10/18/2023 1505 Gross per 24 hour  Intake 760 ml  Output 480 ml  Net 280 ml   Filed Weights   10/16/23 1229  Weight: 65.8 kg   Body mass index is 22.58 kg/m.  Gen: 89 y.o. female in no apparent distress.  Nontoxic Pulm: Non-labored breathing.  Clear to auscultation bilaterally.  CV: Regular rate and rhythm. No murmur, rub, or gallop. No JVD GI: Abdomen soft, non-tender, non-distended, with normoactive bowel sounds. No organomegaly or masses felt. Ext: Warm, no deformities Skin: No rashes, lesions  Neuro: Alert and oriented x 4.  With slight tremor but otherwise.  Strength is 4/5 muscle groups. Psych: Calm  Judgement and insight appear normal. Mood & affect appropriate.    I have personally reviewed the following labs and images: CBC: Recent Labs  Lab 10/16/23 1243 10/16/23 1305 10/17/23 0534 10/18/23 0603  WBC 6.8  --  7.9 8.5  NEUTROABS 4.8  --   --   --   HGB 12.6 12.2 11.3* 12.4  HCT 36.9 36.0 32.3* 35.0*  MCV 98.7  --  96.4 94.9  PLT 195  --  176 200   BMP &GFR Recent Labs  Lab 10/16/23 1243 10/16/23 1305 10/17/23 0233 10/17/23 0534 10/18/23 0603  NA 138 140  --  138 141  K 3.6 3.7  --  3.3* 3.4*  CL 104 106  --  102 105  CO2 21*  --   --  24 27  GLUCOSE 176* 185*  --  137* 162*  BUN 27* 29*  --  24* 23  CREATININE 1.38*  1.40*  --  1.07* 1.25*  CALCIUM  9.3  --   --  8.8* 9.3  MG  --   --  1.5*  --  1.9  PHOS  --   --  3.9  --  4.1   Estimated Creatinine Clearance: 26.4 mL/min (A) (by C-G formula based on SCr of 1.25 mg/dL (H)). Liver & Pancreas: Recent Labs  Lab 10/16/23 1243  AST 23  ALT 20  ALKPHOS 51  BILITOT 0.7  PROT 7.5  ALBUMIN  4.2   No results for input(s): LIPASE, AMYLASE in the last 168 hours. No results for input(s): AMMONIA in the last 168 hours. Diabetic: No results for input(s): HGBA1C in the last 72 hours. Recent Labs  Lab 10/17/23 1148  10/17/23 1613 10/17/23 2059 10/18/23 0624 10/18/23 1130  GLUCAP 155* 163* 209* 182* 229*   Cardiac Enzymes: No results for input(s): CKTOTAL, CKMB, CKMBINDEX, TROPONINI in the last 168 hours. No results for input(s): PROBNP in the last 8760 hours. Coagulation Profile: Recent Labs  Lab 10/16/23 1243  INR 1.1   Thyroid  Function Tests: Recent Labs    10/17/23 0233  TSH 0.957   Lipid Profile: Recent Labs    10/17/23 0233  CHOL 96  HDL 46  LDLCALC 23  TRIG 137  CHOLHDL 2.1   Anemia Panel: No results for input(s): VITAMINB12, FOLATE, FERRITIN, TIBC, IRON, RETICCTPCT in the last 72 hours. Urine analysis:    Component Value Date/Time   COLORURINE YELLOW 10/16/2023 1615   APPEARANCEUR CLEAR 10/16/2023 1615   LABSPEC 1.018 10/16/2023 1615   PHURINE 5.0 10/16/2023 1615   GLUCOSEU NEGATIVE 10/16/2023 1615   HGBUR NEGATIVE 10/16/2023 1615   BILIRUBINUR NEGATIVE 10/16/2023 1615   KETONESUR NEGATIVE 10/16/2023 1615   PROTEINUR 30 (A) 10/16/2023 1615   UROBILINOGEN 0.2 06/23/2014 1140   NITRITE NEGATIVE 10/16/2023 1615   LEUKOCYTESUR NEGATIVE 10/16/2023 1615   Sepsis Labs: Invalid input(s): PROCALCITONIN, LACTICIDVEN  Microbiology: No results found for this or any previous visit (from the past 240 hours).  Radiology Studies: No results found.  Scheduled Meds:  amLODipine   10 mg Oral QHS    apixaban   2.5 mg Oral BID   aspirin  EC  81 mg Oral Daily   atorvastatin   80 mg Oral QHS   feeding supplement  237 mL Oral BID BM   hydrALAZINE   50 mg Oral Q8H   Influenza vac split trivalent PF  0.5 mL Intramuscular Tomorrow-1000   insulin  aspart  0-15 Units Subcutaneous TID WC   insulin  aspart  0-5 Units Subcutaneous QHS   isosorbide  dinitrate  20 mg Oral TID   ranolazine   500 mg Oral BID   Continuous Infusions:   LOS: 2 days   35 minutes with more than 50% spent in reviewing records, counseling patient/family and coordinating care.  Reyes VEAR Gaw, MD [facility] [email or web address] 10/18/2023, 3:16 PM

## 2023-10-18 NOTE — [place] FL2 (Signed)
 [place]  MEDICAID FL2 LEVEL OF CARE FORM     IDENTIFICATION  Patient Name: Allison Duffy Birthdate: July 30, 1926 Sex: female Admission Date (Current Location): 10/16/2023  [facility] and [place] Number:  Producer, television/film/video and Address:  [facility], [address] [place], [place] [address]      Provider Number: [id number]  Attending Physician Name and Address:  Elpidio Reyes DEL, MD  Relative Name and Phone Number:  Allison Duffy,Allison Duffy (Daughter)  [phone]    Current Level of Care: Hospital Recommended Level of Care: Skilled Nursing Facility Prior Approval Number:    Date Approved/Denied:   PASRR Number: [id number]  Discharge Plan: SNF    Current Diagnoses: Patient Active Problem List   Diagnosis Date Noted   VBI (vertebrobasilar insufficiency) 10/16/2023   Dizziness 10/16/2023   CKD stage 3b, GFR 30-44 ml/min (HCC) 10/16/2023   Type 2 diabetes mellitus with hyperglycemia, without long-term current use of insulin  (HCC) 10/16/2023   Intractable pain 03/23/2023   Atypical chest pain 07/24/2022   SOB (shortness of breath) 07/24/2022   Acute respiratory failure with hypoxia secondary to community acquired pneumonia 03/29/2021   Acute respiratory failure with hypoxia (HCC) 03/29/2021   Diarrhea 03/29/2021   Lactic acidosis 03/29/2021   Hypokalemia 03/29/2021   Coronary artery disease 04/13/2020   Unstable angina (HCC) 04/11/2020   Mitral regurgitation 03/01/2019   Chronic diastolic HF (heart failure) (HCC) 01/28/2019   Paroxysmal atrial fibrillation (HCC) 01/28/2019   Colitis, acute 05/31/2016   Primary osteoarthritis of right shoulder 12/27/2014   Pulmonary nodule 06/23/2014   Essential hypertension 07/05/2013   Hyperlipidemia associated with type 2 diabetes mellitus (HCC) 07/05/2013   Type 2 diabetes mellitus with complication, without long-term current use of insulin  (HCC) 07/05/2013   Chest pain CAD 07/05/2013    Orientation  RESPIRATION BLADDER Height & Weight     Self, Situation, Time, Place  Normal Continent Weight: 145 lb (65.8 kg) Height:  5' 7.2 (170.7 cm)  BEHAVIORAL SYMPTOMS/MOOD NEUROLOGICAL BOWEL NUTRITION STATUS      Continent Diet (see dc summary)  AMBULATORY STATUS COMMUNICATION OF NEEDS Skin   Limited Assist Verbally Normal                       Personal Care Assistance Level of Assistance  Feeding, Bathing, Dressing Bathing Assistance: Limited assistance Feeding assistance: Independent Dressing Assistance: Limited assistance     Functional Limitations Info  Speech, Hearing, Sight Sight Info: Impaired Hearing Info: Adequate Speech Info: Adequate    SPECIAL CARE FACTORS FREQUENCY  PT (By licensed PT), OT (By licensed OT)     PT Frequency: 5x/wk OT Frequency: 5x/wk            Contractures Contractures Info: Not present    Additional Factors Info  Code Status, Allergies Code Status Info: DNR Allergies Info: Codeine  Hydrocodone   Morphine  Sulfate  Prednisone   Sulfa Antibiotics           Current Medications (10/18/2023):  This is the current hospital active medication list Current Facility-Administered Medications  Medication Dose Route Frequency Provider Last Rate Last Admin   acetaminophen  (TYLENOL ) tablet 650 mg  650 mg Oral Q6H PRN Amponsah, Prosper M, MD       Or   acetaminophen  (TYLENOL ) suppository 650 mg  650 mg Rectal Q6H PRN Lou Claretta HERO, MD       amLODipine  (NORVASC ) tablet 10 mg  10 mg Oral QHS Von Bellis, MD   10  mg at 10/17/23 2047   apixaban  (ELIQUIS ) tablet 2.5 mg  2.5 mg Oral BID Von Bellis, MD   2.5 mg at 10/18/23 0854   aspirin  EC tablet 81 mg  81 mg Oral Daily Von Bellis, MD   81 mg at 10/18/23 0853   bisacodyl  (DULCOLAX) EC tablet 5 mg  5 mg Oral Daily PRN Amponsah, Prosper M, MD       feeding supplement (ENSURE PLUS HIGH PROTEIN) liquid 237 mL  237 mL Oral BID BM Von Bellis, MD   237 mL at 10/18/23 0854   hydrALAZINE   (APRESOLINE ) injection 10 mg  10 mg Intravenous Q6H PRN Lou Claretta HERO, MD       hydrALAZINE  (APRESOLINE ) tablet 50 mg  50 mg Oral Q8H Von Bellis, MD   50 mg at 10/18/23 0645   Influenza vac split trivalent PF (FLUZONE HIGH-DOSE) injection 0.5 mL  0.5 mL Intramuscular Tomorrow-1000 Von Bellis, MD       insulin  aspart (novoLOG ) injection 0-15 Units  0-15 Units Subcutaneous TID WC Amponsah, Prosper M, MD   3 Units at 10/18/23 9353   insulin  aspart (novoLOG ) injection 0-5 Units  0-5 Units Subcutaneous QHS Lou Claretta HERO, MD   2 Units at 10/17/23 2215   isosorbide  dinitrate (ISORDIL ) tablet 20 mg  20 mg Oral TID Von Bellis, MD   20 mg at 10/18/23 9145   meclizine  (ANTIVERT ) tablet 12.5 mg  12.5 mg Oral TID PRN Von Bellis, MD       ondansetron  (ZOFRAN ) tablet 4 mg  4 mg Oral Q6H PRN Lou Claretta HERO, MD       Or   ondansetron  (ZOFRAN ) injection 4 mg  4 mg Intravenous Q6H PRN Amponsah, Prosper M, MD       ranolazine  (RANEXA ) 12 hr tablet 500 mg  500 mg Oral BID Amponsah, Prosper M, MD   500 mg at 10/18/23 9146   rosuvastatin  (CRESTOR ) tablet 20 mg  20 mg Oral QHS Amponsah, Prosper M, MD   20 mg at 10/17/23 2047   senna-docusate (Senokot-S) tablet 1 tablet  1 tablet Oral QHS PRN Amponsah, Prosper M, MD   1 tablet at 10/17/23 2047     Discharge Medications: Please see discharge summary for a list of discharge medications.  Relevant Imaging Results:  Relevant Lab Results:   Additional Information SSN [id number]  [name], [name]

## 2023-10-19 DIAGNOSIS — H04123 Dry eye syndrome of bilateral lacrimal glands: Secondary | ICD-10-CM | POA: Diagnosis not present

## 2023-10-19 DIAGNOSIS — I340 Nonrheumatic mitral (valve) insufficiency: Secondary | ICD-10-CM | POA: Diagnosis not present

## 2023-10-19 DIAGNOSIS — I480 Paroxysmal atrial fibrillation: Secondary | ICD-10-CM | POA: Diagnosis not present

## 2023-10-19 DIAGNOSIS — I10 Essential (primary) hypertension: Secondary | ICD-10-CM | POA: Diagnosis not present

## 2023-10-19 DIAGNOSIS — Z23 Encounter for immunization: Secondary | ICD-10-CM | POA: Diagnosis not present

## 2023-10-19 DIAGNOSIS — E1165 Type 2 diabetes mellitus with hyperglycemia: Secondary | ICD-10-CM | POA: Diagnosis not present

## 2023-10-19 DIAGNOSIS — E1122 Type 2 diabetes mellitus with diabetic chronic kidney disease: Secondary | ICD-10-CM | POA: Diagnosis not present

## 2023-10-19 DIAGNOSIS — Z8673 Personal history of transient ischemic attack (TIA), and cerebral infarction without residual deficits: Secondary | ICD-10-CM | POA: Diagnosis not present

## 2023-10-19 DIAGNOSIS — E785 Hyperlipidemia, unspecified: Secondary | ICD-10-CM | POA: Diagnosis not present

## 2023-10-19 DIAGNOSIS — I5030 Unspecified diastolic (congestive) heart failure: Secondary | ICD-10-CM | POA: Diagnosis not present

## 2023-10-19 DIAGNOSIS — H6122 Impacted cerumen, left ear: Secondary | ICD-10-CM | POA: Diagnosis not present

## 2023-10-19 DIAGNOSIS — G450 Vertebro-basilar artery syndrome: Secondary | ICD-10-CM | POA: Diagnosis not present

## 2023-10-19 DIAGNOSIS — R42 Dizziness and giddiness: Secondary | ICD-10-CM | POA: Diagnosis not present

## 2023-10-19 DIAGNOSIS — I5033 Acute on chronic diastolic (congestive) heart failure: Secondary | ICD-10-CM | POA: Diagnosis not present

## 2023-10-19 DIAGNOSIS — N1831 Chronic kidney disease, stage 3a: Secondary | ICD-10-CM | POA: Diagnosis not present

## 2023-10-19 DIAGNOSIS — L988 Other specified disorders of the skin and subcutaneous tissue: Secondary | ICD-10-CM | POA: Diagnosis not present

## 2023-10-19 DIAGNOSIS — K589 Irritable bowel syndrome without diarrhea: Secondary | ICD-10-CM | POA: Diagnosis not present

## 2023-10-19 DIAGNOSIS — Z8781 Personal history of (healed) traumatic fracture: Secondary | ICD-10-CM | POA: Diagnosis not present

## 2023-10-19 DIAGNOSIS — R4181 Age-related cognitive decline: Secondary | ICD-10-CM | POA: Diagnosis not present

## 2023-10-19 DIAGNOSIS — R531 Weakness: Secondary | ICD-10-CM | POA: Diagnosis not present

## 2023-10-19 DIAGNOSIS — K5900 Constipation, unspecified: Secondary | ICD-10-CM | POA: Diagnosis not present

## 2023-10-19 DIAGNOSIS — I2510 Atherosclerotic heart disease of native coronary artery without angina pectoris: Secondary | ICD-10-CM | POA: Diagnosis not present

## 2023-10-19 DIAGNOSIS — E876 Hypokalemia: Secondary | ICD-10-CM | POA: Diagnosis not present

## 2023-10-19 DIAGNOSIS — Z743 Need for continuous supervision: Secondary | ICD-10-CM | POA: Diagnosis not present

## 2023-10-19 LAB — GLUCOSE, CAPILLARY
Glucose-Capillary: 169 mg/dL — ABNORMAL HIGH (ref 70–99)
Glucose-Capillary: 275 mg/dL — ABNORMAL HIGH (ref 70–99)
Glucose-Capillary: 140 mg/dL — ABNORMAL HIGH (ref 70–99)

## 2023-10-19 LAB — BASIC METABOLIC PANEL WITH GFR
Anion gap: 9 (ref 5–15)
BUN: 33 mg/dL — ABNORMAL HIGH (ref 8–23)
CO2: 24 mmol/L (ref 22–32)
Calcium: 9.2 mg/dL (ref 8.9–10.3)
Chloride: 104 mmol/L (ref 98–111)
Creatinine, Ser: 1.59 mg/dL — ABNORMAL HIGH (ref 0.44–1.00)
GFR, Estimated: 30 mL/min — ABNORMAL LOW (ref >60)
Glucose, Bld: 165 mg/dL — ABNORMAL HIGH (ref 70–99)
Potassium: 4.3 mmol/L (ref 3.5–5.1)
Sodium: 137 mmol/L (ref 135–145)

## 2023-10-19 MED ORDER — CARBAMIDE PEROXIDE 6.5 % OT SOLN: 5.0000 [drp] | Freq: Two times a day (BID) | OTIC | 0 refills | Status: DC

## 2023-10-19 MED ORDER — BISACODYL 5 MG PO TBEC: 5.0000 mg | DELAYED_RELEASE_TABLET | Freq: Every day | ORAL | 0 refills | Status: DC | PRN

## 2023-10-19 MED ORDER — MECLIZINE HCL 12.5 MG PO TABS: 12.5000 mg | ORAL_TABLET | Freq: Three times a day (TID) | ORAL | 0 refills | Status: DC | PRN

## 2023-10-19 NOTE — Progress Notes (Signed)
 Physical Therapy Treatment Patient Details Name: Allison Duffy MRN: [id number] DOB: [birth year] Today's Date: 10/19/2023   History of Present Illness Allison Duffy is a 89 y.o. female admitted 9/19 with acute onset of dizziness. workup for UTI.  PMH: acute on chronic combined systolic and diastolic CHF, arthritis, chest pain, hyperlipidemia, hypertension, type 2 diabetes, A-fib previously on Eliquis  discontinued medication approximately 2 weeks ago according to patient's daughter due to 2 recent falls    PT Comments  Pt received sitting up in chair; she is pleasant and agreeable for PT session. Pt reports mild dizziness 3/10 and not provoked with head turns, so did not perform vestibular treatment, instead focused session on mobility progression. Pt demonstrates improved activity tolerance and ambulation distance this session. Requires minA for transfers and ambulating limited hallway distance with RW and CGA. Gait speed of 0.67 ft/s indicative of high fall risk. Patient will benefit from continued inpatient follow up therapy, <3 hours/day.    If plan is discharge home, recommend the following: Assistance with cooking/housework;Assist for transportation   Can travel by private vehicle     Yes  Equipment Recommendations  None recommended by PT    Recommendations for Other Services       Precautions / Restrictions Precautions Precautions: Fall Recall of Precautions/Restrictions: Intact Restrictions Weight Bearing Restrictions Per Provider Order: No     Mobility  Bed Mobility               General bed mobility comments: OOB in chair    Transfers Overall transfer level: Needs assistance Equipment used: Rolling walker (2 wheels) Transfers: Sit to/from Stand Sit to Stand: Min assist           General transfer comment: MinA to fully power up from chair    Ambulation/Gait Ambulation/Gait assistance: Contact guard assist Gait Distance (Feet): 100 Feet (100,  100) Assistive device: Rolling walker (2 wheels) Gait Pattern/deviations: Step-through pattern, Decreased stride length, Trunk flexed Gait velocity: 0.67 ft/s Gait velocity interpretation: <1.31 ft/sec, indicative of household ambulator   General Gait Details: CGA for safety, decreased bilateral foot clearance   Stairs             Wheelchair Mobility     Tilt Bed    Modified Rankin (Stroke Patients Only)       Balance Overall balance assessment: Needs assistance Sitting-balance support: Feet supported Sitting balance-Leahy Scale: Fair     Standing balance support: Bilateral upper extremity supported Standing balance-Leahy Scale: Poor                              Communication Communication Communication: Impaired Factors Affecting Communication: Hearing impaired  Cognition Arousal: Alert Behavior During Therapy: WFL for tasks assessed/performed   PT - Cognitive impairments: No apparent impairments                         Following commands: Intact      Cueing Cueing Techniques: Verbal cues  Exercises      General Comments        Pertinent Vitals/Pain Pain Assessment Pain Assessment: No/denies pain    Home Living                          Prior Function            PT Goals (current goals can now be found in the care  plan section) Acute Rehab PT Goals Patient Stated Goal: to go home Potential to Achieve Goals: Good Progress towards PT goals: Progressing toward goals    Frequency    Min 2X/week      PT Plan      Co-evaluation              AM-PAC PT 6 Clicks Mobility   Outcome Measure  Help needed turning from your back to your side while in a flat bed without using bedrails?: None Help needed moving from lying on your back to sitting on the side of a flat bed without using bedrails?: A Little Help needed moving to and from a bed to a chair (including a wheelchair)?: A Little Help needed  standing up from a chair using your arms (e.g., wheelchair or bedside chair)?: A Little Help needed to walk in hospital room?: A Little Help needed climbing 3-5 steps with a railing? : A Lot 6 Click Score: 18    End of Session Equipment Utilized During Treatment: Gait belt Activity Tolerance: Patient tolerated treatment well Patient left: in chair;with call bell/phone within reach;with chair alarm set Nurse Communication: Mobility status PT Visit Diagnosis: Muscle weakness (generalized) (M62.81);BPPV;Dizziness and giddiness (R42) BPPV - Right/Left : Left     Time: 9140-9078 PT Time Calculation (min) (ACUTE ONLY): 22 min  Charges:    $Therapeutic Activity: 8-22 mins PT General Charges $$ ACUTE PT VISIT: 1 Visit                     Aleck Daring, PT, DPT Acute Rehabilitation Services Office [phone]    Aleck ONEIDA Daring 10/19/2023, 9:25 AM

## 2023-10-19 NOTE — TOC Transition Note (Signed)
 Transition of Care [facility]) - Discharge Note   Patient Details  Name: Allison Duffy MRN: [id number] Date of Birth: Jan 20, 1927  [facility] CM/SW Contact:  [name], [name] Phone Number: 10/19/2023, 3:43 PM   Clinical Narrative:   CSW reached out to [facility] to review referral, they are able to offer a bed for patient. Bed is available today, patient medically stable. CSW updated patient and daughter, Jori, via phone, they are in agreement. Transport arranged with [facility] for next available.  Nurse to call report to [phone], Room 202.    Final next level of care: Skilled Nursing Facility Barriers to Discharge: Barriers Resolved   Patient Goals and CMS Choice            Discharge Placement              Patient chooses bed at: [facility], [place] Patient to be transferred to facility by: [facility] Name of family member notified: Self, Jori Patient and family notified of of transfer: 10/19/23  Discharge Plan and Services Additional resources added to the After Visit Summary for                                       Social Drivers of Health (SDOH) Interventions SDOH Screenings   Food Insecurity: No Food Insecurity (10/17/2023)  Housing: Low Risk  (10/17/2023)  Transportation Needs: No Transportation Needs (10/17/2023)  Utilities: Not At Risk (10/17/2023)  Depression (PHQ2-9): Low Risk  (04/30/2021)  Social Connections: Socially Isolated (10/17/2023)  Tobacco Use: Medium Risk (10/16/2023)     Readmission Risk Interventions    03/25/2023   10:24 AM 07/30/2022    2:17 PM  Readmission Risk Prevention Plan  Transportation Screening Complete Complete  PCP or Specialist Appt within 3-5 Days Complete Complete  HRI or Home Care Consult Complete   Social Work Consult for Recovery Care Planning/Counseling Complete Complete  Palliative Care Screening Complete Complete  Medication Review Oceanographer) Complete Complete

## 2023-10-19 NOTE — Discharge Summary (Signed)
 Physician Discharge Summary   Patient: Allison Duffy MRN: [id number] DOB: 04/28/1926  Admit date:     10/16/2023  Discharge date: 10/19/23  Discharge Physician: Reyes VEAR Gaw   PCP: Shepard Ade, MD   Recommendations at discharge:   Patient mated for dizziness and the most likely not vertebrobasilar insufficiency.  Recommended to follow-up with neurology in the next 1 to 2 months to ensure that she is continue to doing well. Daughter expressed to me that she had noticed some hearing changes but especially balance changes in her mom over the past couple weeks.  I did otoscopic exam which showed complete occlusion with dried cerumen of left ear canal.  Starting Debrox and recommend follow-up to ensure resolution and follow-up with ENT/audiology for hearing and further evaluation after Debrox use.  Discharge Diagnoses: Principal Problem:   Dizziness Active Problems:   Chronic diastolic HF (heart failure) (HCC)   VBI (vertebrobasilar insufficiency)   CKD stage 3b, GFR 30-44 ml/min (HCC)   Type 2 diabetes mellitus with hyperglycemia, without long-term current use of insulin  (HCC)  Resolved Problems:   * No resolved hospital problems. *  Hospital Course: 89 y.o. female with medical history significant for A-fib recently discontinued anticoagulation, HTN, HLD, chronic diastolic HF, CKD 3B, arthritis, T2DM and CAD who presented to the ED for evaluation of dizziness. Patient reports that she went to the beauty shop and then to the grocery store with her daughter. When they got to the parking lot, she started feeling dizzy and very weak  felt like I was going to die. She was brought to the ED for further evaluation. At bedside, she denies any dizziness at rest but reports very dry mouth and feeling very weak. She denies any chest pain, shortness of breath, vision changes, abdominal pain, nausea, vomiting, dysuria, fevers or chills.  Per daughter, patient's speech seems slightly off.     ED Course: Initial vitals show patient afebrile, HR 60-70s, SBP 140-180s, SpO2 100% on room air. Initial labs significant for creatinine 1.38, glucose 185, normal CBC, UA with no signs of infection, negative ethanol levels and normal INR. EKG shows ectopic atrial rhythm. CXR shows no active disease.  CT head with no acute intracranial abnormality. CTA head and neck shows severe narrowing of the bilateral distal vertebral arteries and the basilar confluence and no LVO. MRI brain with no acute intracranial abnormality. Pt received IV Ativan  1 mg x 1, IV Valium  5 mg x 1.  Neurology was consulted for evaluation. TR[facility] was consulted for admission.   Dizziness resolved pretty shortly after admission.  Neurology followed patient while in house.  Full workup was negative for any acute issues and she was deemed to have most likely vertebrobasilar insufficiency with plans for medical management.  Daughter also expressed concern for patient's hearing over the same period of time.  Otoscopic examination on day of discharge revealed cerumen impaction left auditory canal, Debrox started and ordered to be continued for a week after discharge.  Recommending follow-up outpatient with ENT to further evaluate hearing and if any ear/vestibular cause to her dizziness.     Assessment & Plan: # Dizziness # Vertebrobasilar insufficiency - Patient presented with acute onset of dizziness and generalized weakness -CTA/MRI brain showed no acute stroke but did show severe narrowing of the bilateral distal vertebral arteries at the basilar confluence - Neurology has been following, signed off 9/20 Neurology recommended to resume Eliquis  2.5 mg p.o. twice daily home dose.  TTE: LVEF 55 to 60%,  grade 3 DD, moderate pulm hypertension, severe LA dilatation -Overall much improved and back to her room baseline.  Daughter is concerned about left arm strength which was addressed at prior rehab visit.  Will continue with PT for this as  well   # AKI on CKD 3a -Baseline creatinine appears to be anywhere from 1.2-1.5 over the past year. - Creatinine is hovered between her baseline for the past several days including day of discharge.    # Hypokalemia - Remains low today, replaced  # Hypomagnesemia, mag repleted. Check electrolytes daily and replace as needed--> stable prior to discharge.   # Hypertensive urgency - Now resolved. - Back on majority of her home blood pressure medications. Held clonidine  due to bradycardia, no issues with rebound hypertension   # Chronic diastolic HF - Last TTE February 2025 showed EF 60 to 65%, mild LVH, G1DD, and severely dilated LA - Patient quite dry on exam, hold Lasix  Repeat TTE as above  Cerumen impaction: -Started Debrox today.  Could be contributing to hearing loss on left side. - Recommend ENT follow-up at discharge.   # Paroxysmal A-fib - Rate controlled, remains in normal sinus - Per daughter, Eliquis  recently discontinued due to falls--> resumed this admission after being discussed with patient's daughter   # T2DM with hyperglycemia - Last A1c 7.9% 6 months ago - Blood glucose elevated to 176 on admission - SSI with meals, CBG monitoring - Follow-up repeat A1c   # CAD - Chronic and stable, no chest pain - Continue aspirin  and Ranexa    # HLD - Continue rosuvastatin  -lipid panel LDL 23 Wound 10/17/23 1556 Pressure Injury Sacrum Medial Stage 2 -  Partial thickness loss of dermis presenting as a shallow open injury with a red, pink wound bed without slough. (Active)           Consultants: Neurology Procedures performed: None Disposition: Skilled nursing facility Diet recommendation:  Discharge Diet Orders (From admission, onward)     Start     Ordered   10/19/23 0000  Diet - low sodium heart healthy        10/19/23 1411           Heart healthy diet DISCHARGE MEDICATION: Allergies as of 10/19/2023       Reactions   Codeine Nausea And Vomiting    Hydrocodone  Nausea And Vomiting   Nausea and vomiting   Morphine  Sulfate Nausea And Vomiting   Prednisone     Other reaction(s): sick on her stomach   Sulfa Antibiotics Itching, Nausea Only, Other (See Comments)        Medication List     TAKE these medications    amLODipine  10 MG tablet Commonly known as: NORVASC  Take 10 mg by mouth at bedtime.   apixaban  2.5 MG Tabs tablet Commonly known as: ELIQUIS  Take 2.5 mg by mouth 2 (two) times daily.   aspirin  EC 81 MG tablet Take 81 mg by mouth daily. Swallow whole.   bisacodyl  5 MG EC tablet Commonly known as: DULCOLAX Take 1 tablet (5 mg total) by mouth daily as needed for moderate constipation.   carbamide peroxide 6.5 % OTIC solution Commonly known as: DEBROX Place 5 drops into the left ear 2 (two) times daily. X 7 days   carboxymethylcellulose 1 % ophthalmic solution Place 1 drop into both eyes 2 (two) times daily.   cholecalciferol 25 MCG (1000 UNIT) tablet Commonly known as: VITAMIN D3 Take 1,000 Units by mouth daily with lunch.   cholestyramine   4 g packet Commonly known as: QUESTRAN  Take 4 g by mouth daily as needed (IBS).   cloNIDine  0.1 MG tablet Commonly known as: CATAPRES  TAKE 2 TABLETS BY MOUTH EVERY MORNING AND TAKE 1 TABLET BY MOUTH EVERY NIGHT AT BEDTIME   furosemide  20 MG tablet Commonly known as: LASIX  Take 1 tablet (20 mg total) by mouth daily. What changed: how much to take   hydrALAZINE  25 MG tablet Commonly known as: APRESOLINE  TAKE 1 1/2 TABLETS(37.5 MG) THREE TIMES DAILY   isosorbide  dinitrate 20 MG tablet Commonly known as: ISORDIL  TAKE 1 TABLET BY MOUTH THREE TIMES DAILY. TAKE WITH HYDRALAZINE  37.5MG (1 AND A 1/2 TABLETS)   meclizine  12.5 MG tablet Commonly known as: ANTIVERT  Take 1 tablet (12.5 mg total) by mouth 3 (three) times daily as needed for dizziness.   metFORMIN  500 MG tablet Commonly known as: GLUCOPHAGE  Take 1 tablet (500 mg total) by mouth daily with breakfast. What  changed: when to take this   multivitamin with minerals Tabs tablet Take 1 tablet by mouth daily with supper.   nitroGLYCERIN  0.4 MG SL tablet Commonly known as: NITROSTAT  PLACE 1 TABLET (0.4 MG TOTAL) UNDER THE TONGUE EVERY FIVE MINUTES X 3 DOSES AS NEEDED FOR CHEST PAIN.   omeprazole  40 MG capsule Commonly known as: PRILOSEC TAKE 1 CAPSULE BY MOUTH DAILY WITH SUPPER   potassium chloride  10 MEQ tablet Commonly known as: KLOR-CON  TAKE 1 TABLET(10 MEQ) BY MOUTH DAILY-- MAY TAKE AN EXTRA WITH A WEIGHT INCREASE OF 2LBS OVERNIGHT OR 5 LBS TOTAL AS DIRECTED What changed: See the new instructions.   Pro-biotic Blend Caps Take 1 capsule by mouth at bedtime. Takes OTC probiotic   ranolazine  500 MG 12 hr tablet Commonly known as: RANEXA  TAKE 1 TABLET(500 MG) BY MOUTH TWICE DAILY   rosuvastatin  20 MG tablet Commonly known as: CRESTOR  Take 20 mg by mouth at bedtime.   sitaGLIPtin 100 MG tablet Commonly known as: JANUVIA Take 100 mg by mouth daily with lunch.        Contact information for after-discharge care     Destination     [facility], [place] .   Service: Skilled Nursing Contact information: [address] [place]  [phone]                    Discharge Exam: Allison Duffy   10/16/23 1229  Weight: 65.8 kg   Gen: 89 y.o. female in no apparent distress.  Nontoxic Pulm: Non-labored breathing.  Clear to auscultation bilaterally.  CV: Regular rate and rhythm. No murmur, rub, or gallop. No JVD GI: Abdomen soft, non-tender, non-distended, with normoactive bowel sounds. No organomegaly or masses felt. Ext: Warm, no deformities Skin: No rashes, lesions  Neuro: Alert and oriented x 4.  With slight tremor but otherwise.  Strength is 4/5 muscle groups. Psych: Calm  Judgement and insight appear normal. Mood & affect appropriate.  Condition at discharge: Good  The results of significant diagnostics from this  hospitalization (including imaging, microbiology, ancillary and laboratory) are listed below for reference.   Imaging Studies: ECHOCARDIOGRAM COMPLETE Result Date: 10/17/2023    ECHOCARDIOGRAM REPORT   Patient Name:   Allison Duffy Date of Exam: 10/17/2023 Medical Rec #:  [id number]        Height:       67.2 in Accession #:    [id number]       Weight:       145.0 lb Date of Birth:  28-Aug-1926        BSA:          1.768 m Patient Age:    89 years         BP:           172/78 mmHg Patient Gender: F                HR:           70 bpm. Exam Location:  Inpatient Procedure: 2D Echo (Both Spectral and Color Flow Doppler were utilized during            procedure). Indications:    stroke  History:        Patient has prior history of Echocardiogram examinations, most                 recent 03/27/2023. CAD, chronic kidney disease; Risk                 Factors:Diabetes and Hypertension.  Sonographer:    Tinnie Barefoot RDCS Referring Phys: [id number] PROSPER M AMPONSAH IMPRESSIONS  1. Left ventricular ejection fraction, by estimation, is 55 to 60%. The left ventricle has normal function. The left ventricle has no regional wall motion abnormalities. There is mild concentric left ventricular hypertrophy. Left ventricular diastolic parameters are consistent with Grade III diastolic dysfunction (restrictive).  2. Right ventricular systolic function is normal. The right ventricular size is normal. There is moderately elevated pulmonary artery systolic pressure. The estimated right ventricular systolic pressure is 51.0 mmHg.  3. Left atrial size was severely dilated.  4. The mitral valve is normal in structure. Mild to moderate mitral valve regurgitation. No evidence of mitral stenosis.  5. Tricuspid valve regurgitation is mild to moderate.  6. The aortic valve is tricuspid. There is mild calcification of the aortic valve. Aortic valve regurgitation is not visualized. Aortic valve sclerosis/calcification is present, without any  evidence of aortic stenosis.  7. The inferior vena cava is dilated in size with <50% respiratory variability, suggesting right atrial pressure of 15 mmHg. Conclusion(s)/Recommendation(s): The rhythm is regular. Interpretation of diastolic function assumes that the patient is in normal sinus rhythm. Cannot exclude that the current rhythm is atrial flutter with 4:1 AV block, in which case diastolic function cannot be appropriately interpreted. FINDINGS  Left Ventricle: Left ventricular ejection fraction, by estimation, is 55 to 60%. The left ventricle has normal function. The left ventricle has no regional wall motion abnormalities. The left ventricular internal cavity size was normal in size. There is  mild concentric left ventricular hypertrophy. Left ventricular diastolic parameters are consistent with Grade III diastolic dysfunction (restrictive). Right Ventricle: The right ventricular size is normal. No increase in right ventricular wall thickness. Right ventricular systolic function is normal. There is moderately elevated pulmonary artery systolic pressure. The tricuspid regurgitant velocity is 3.28 m/s, and with an assumed right atrial pressure of 8 mmHg, the estimated right ventricular systolic pressure is 51.0 mmHg. Left Atrium: Left atrial size was severely dilated. Right Atrium: Right atrial size was normal in size. Pericardium: There is no evidence of pericardial effusion. Mitral Valve: The mitral valve is normal in structure. Mild mitral annular calcification. Mild to moderate mitral valve regurgitation, with centrally-directed jet. No evidence of mitral valve stenosis. Tricuspid Valve: The tricuspid valve is normal in structure. Tricuspid valve regurgitation is mild to moderate. Aortic Valve: The aortic valve is tricuspid. There is mild calcification of the aortic valve. Aortic valve regurgitation is not visualized. Aortic valve  sclerosis/calcification is present, without any evidence of aortic stenosis.  Pulmonic Valve: The pulmonic valve was grossly normal. Pulmonic valve regurgitation is trivial. No evidence of pulmonic stenosis. Aorta: The aortic root and ascending aorta are structurally normal, with no evidence of dilitation. Venous: The inferior vena cava is dilated in size with less than 50% respiratory variability, suggesting right atrial pressure of 15 mmHg. IAS/Shunts: No atrial level shunt detected by color flow Doppler.  LEFT VENTRICLE PLAX 2D LVIDd:         4.80 cm   Diastology LVIDs:         3.30 cm   LV e' medial:    5.98 cm/s LV PW:         1.20 cm   LV E/e' medial:  19.7 LV IVS:        1.20 cm   LV e' lateral:   13.40 cm/s LVOT diam:     2.10 cm   LV E/e' lateral: 8.8 LV SV:         58 LV SV Index:   33 LVOT Area:     3.46 cm  RIGHT VENTRICLE          IVC RV Basal diam:  3.05 cm  IVC diam: 2.20 cm TAPSE (M-mode): 1.6 cm LEFT ATRIUM              Index        RIGHT ATRIUM           Index LA diam:        4.70 cm  2.66 cm/m   RA Area:     15.40 cm LA Vol (A2C):   95.9 ml  54.25 ml/m  RA Volume:   41.30 ml  23.36 ml/m LA Vol (A4C):   101.0 ml 57.14 ml/m LA Biplane Vol: 100.0 ml 56.57 ml/m  AORTIC VALVE             PULMONIC VALVE LVOT Vmax:   77.10 cm/s  PR End Diast Vel: 5.29 msec LVOT Vmean:  50.100 cm/s LVOT VTI:    0.167 m  AORTA Ao Root diam: 3.00 cm Ao Asc diam:  3.10 cm MITRAL VALVE                TRICUSPID VALVE MV Area (PHT): 5.13 cm     TR Peak grad:   43.0 mmHg MV Decel Time: 148 msec     TR Vmax:        328.00 cm/s MR Peak grad: 144.5 mmHg MR Mean grad: 99.0 mmHg     SHUNTS MR Vmax:      601.00 cm/s   Systemic VTI:  0.17 m MR Vmean:     477.0 cm/s    Systemic Diam: 2.10 cm MV E velocity: 118.00 cm/s MV A velocity: 39.20 cm/s MV E/A ratio:  3.01 Mihai Croitoru MD Electronically signed by Jerel Balding MD Signature Date/Time: 10/17/2023/1:49:48 PM    Final    DG Chest 2 View Result Date: 10/16/2023 CLINICAL DATA:  Dizziness EXAM: CHEST - 2 VIEW COMPARISON:  03/23/2023 FINDINGS: Frontal  and lateral views of the chest demonstrate an unremarkable cardiac silhouette. Stable mitral annular calcifications and atherosclerosis of the aortic arch. Stable chronic pulmonary vascular congestion. No airspace disease, effusion, or pneumothorax. No acute bony abnormalities. Bilateral shoulder arthroplasties. IMPRESSION: 1. Chronic pulmonary vascular congestion without overt edema. Electronically Signed   By: Ozell Daring M.D.   On: 10/16/2023 15:21   MR BRAIN WO CONTRAST Result Date:  10/16/2023 EXAM: MRI BRAIN WITHOUT CONTRAST 10/16/2023 01:41:53 PM TECHNIQUE: Multiplanar multisequence MRI of the head/brain was performed without the administration of intravenous contrast. COMPARISON: CT head and CTA head and neck earlier same day. CLINICAL HISTORY: Neuro deficit, acute, stroke suspected. FINDINGS: BRAIN AND VENTRICLES: No acute infarct. No intracranial hemorrhage. No mass. No midline shift. No hydrocephalus. The sella is unremarkable. Normal flow voids. T2 and FLAIR hyperintensity in the periventricular and subcortical white matter compatible with moderate chronic microvascular ischemic changes. Generalized parenchymal volume loss. Remote infarcts in the left cerebellum. ORBITS: Bilateral lens replacement. SINUSES AND MASTOIDS: No acute abnormality. BONES AND SOFT TISSUES: Normal marrow signal. No acute soft tissue abnormality. IMPRESSION: 1. No acute intracranial abnormality. 2. Moderate chronic microvascular ischemic disease and generalized parenchymal volume loss. 3. Remote left cerebellar infarcts. Electronically signed by: Donnice Mania MD 10/16/2023 01:59 PM EDT RP Workstation: [id number]   CT ANGIO HEAD NECK W WO CM (CODE STROKE) Result Date: 10/16/2023 EXAM: CTA HEAD AND NECK WITHOUT AND WITH 10/16/2023 01:06:31 PM TECHNIQUE: CTA of the head and neck was performed with and without the administration of intravenous contrast. Multiplanar 2D and/or 3D reformatted images are provided for review.  Automated exposure control, iterative reconstruction, and/or weight based adjustment of the mA/kV was utilized to reduce the radiation dose to as low as reasonably achievable. Stenosis of the internal carotid arteries measured using NASCET criteria. COMPARISON: Head CT 10/16/2023 CLINICAL HISTORY: Neuro deficit, acute, stroke suspected. Code stroke FINDINGS: CTA NECK: AORTIC ARCH AND ARCH VESSELS: Atherosclerotic calcifications of the aortic arch and arch vessel origins, which remain patent. No dissection or arterial injury. No significant stenosis of the brachiocephalic or subclavian arteries. CERVICAL CAROTID ARTERIES: No dissection, arterial injury, or hemodynamically significant stenosis by NASCET criteria. CERVICAL VERTEBRAL ARTERIES: Severe narrowing of the bilateral distal vertebral arteries at the basilar confluence. The vertebral arteries are patent proximally. No dissection or arterial injury. LUNGS AND MEDIASTINUM: Unremarkable. SOFT TISSUES: No acute abnormality. BONES: No acute abnormality. CTA HEAD: ANTERIOR CIRCULATION: Extensive calcified plaque of the bilateral carotid siphons resulting in at least mild stenosis of the bilateral supraclinoid segments. The anterior and middle cerebral arteries are patent proximally without significant stenosis. No aneurysm. POSTERIOR CIRCULATION: Severe narrowing of the bilateral distal vertebral arteries at the basilar confluence. No significant stenosis of the posterior cerebral arteries. No significant stenosis of the basilar artery. No aneurysm. OTHER: No dural venous sinus thrombosis on this non-dedicated study. IMPRESSION: 1. No large vessel occlusion. 2. Severe narrowing of the bilateral distal vertebral arteries at the basilar confluence; vertebral arteries remain patent proximally. 3. Extensive calcified plaque of the bilateral carotid siphons with at least mild stenosis of the bilateral supraclinoid ICA segments. Code stroke results were communicated to Dr.  Arora at 1:17 pm on 10/16/2023 by secure text paging. Electronically signed by: Ryan Chess MD 10/16/2023 01:18 PM EDT RP Workstation: [id number]   CT HEAD CODE STROKE WO CONTRAST Result Date: 10/16/2023 EXAM: CT HEAD WITHOUT CONTRAST 10/16/2023 12:50:45 PM TECHNIQUE: CT of the head was performed without the administration of intravenous contrast. Automated exposure control, iterative reconstruction, and/or weight based adjustment of the mA/kV was utilized to reduce the radiation dose to as low as reasonably achievable. COMPARISON: 09/08/2023 CLINICAL HISTORY: Neuro deficit, acute, stroke suspected. Left side weakness, aphasia; LKW 1100; Dr voncile FINDINGS: BRAIN AND VENTRICLES: Unchanged old infarct in the inferior aspect of the left cerebellar hemisphere. Stable background of moderate chronic small vessel disease, greater within the right frontal subcortical and deep cerebral  white matter. No new loss of gray-white differentiation. No acute hemorrhage. No evidence of acute infarct. No hydrocephalus. No extra-axial collection. No mass effect or midline shift. ORBITS: No acute abnormality. SINUSES: No acute abnormality. SOFT TISSUES AND SKULL: No acute soft tissue abnormality. No skull fracture. [place] Stroke Program Early CT (ASPECT) Score: Ganglionic (caudate, IC, lentiform nucleus, insula, M1-M3): 7 Supraganglionic (M4-M6): 3 Total: 10 IMPRESSION: 1. No acute intracranial abnormality. ASPECT score: 10. 2. Stable background of moderate chronic small vessel disease, greater within the right frontal subcortical and deep cerebral white matter. Code stroke results were communicated to Dr. Arora at 1:03 pm on 10/16/2023 by secure messaging. Electronically signed by: Ryan Chess MD 10/16/2023 01:03 PM EDT RP Workstation: [id number]    Microbiology: Results for orders placed or performed during the hospital encounter of 03/23/23  Surgical pcr screen     Status: None   Collection Time: 03/27/23  3:55 PM    Specimen: Nasal Mucosa; Nasal Swab  Result Value Ref Range Status   MRSA, PCR NEGATIVE NEGATIVE Final   Staphylococcus aureus NEGATIVE NEGATIVE Final    Comment: (NOTE) The Xpert SA Assay (FDA approved for NASAL specimens in patients 32 years of age and older), is one component of a comprehensive surveillance program. It is not intended to diagnose infection nor to guide or monitor treatment. Performed at [facility], [address], [place], [place] [address]     Labs: CBC: Recent Labs  Lab 10/16/23 1243 10/16/23 1305 10/17/23 0534 10/18/23 0603  WBC 6.8  --  7.9 8.5  NEUTROABS 4.8  --   --   --   HGB 12.6 12.2 11.3* 12.4  HCT 36.9 36.0 32.3* 35.0*  MCV 98.7  --  96.4 94.9  PLT 195  --  176 200   Basic Metabolic Panel: Recent Labs  Lab 10/16/23 1243 10/16/23 1305 10/17/23 0233 10/17/23 0534 10/18/23 0603 10/19/23 0212  NA 138 140  --  138 141 137  K 3.6 3.7  --  3.3* 3.4* 4.3  CL 104 106  --  102 105 104  CO2 21*  --   --  24 27 24   GLUCOSE 176* 185*  --  137* 162* 165*  BUN 27* 29*  --  24* 23 33*  CREATININE 1.38* 1.40*  --  1.07* 1.25* 1.59*  CALCIUM  9.3  --   --  8.8* 9.3 9.2  MG  --   --  1.5*  --  1.9  --   PHOS  --   --  3.9  --  4.1  --    Liver Function Tests: Recent Labs  Lab 10/16/23 1243  AST 23  ALT 20  ALKPHOS 51  BILITOT 0.7  PROT 7.5  ALBUMIN  4.2   CBG: Recent Labs  Lab 10/18/23 1130 10/18/23 1630 10/18/23 2100 10/19/23 0602 10/19/23 1403  GLUCAP 229* 178* 163* 169* 275*    Discharge time spent: Less than 30 minutes.  Signed: Reyes VEAR Gaw, MD [facility] 10/19/2023

## 2023-10-21 DIAGNOSIS — I480 Paroxysmal atrial fibrillation: Secondary | ICD-10-CM | POA: Diagnosis not present

## 2023-10-21 DIAGNOSIS — I5030 Unspecified diastolic (congestive) heart failure: Secondary | ICD-10-CM | POA: Diagnosis not present

## 2023-10-21 DIAGNOSIS — G450 Vertebro-basilar artery syndrome: Secondary | ICD-10-CM | POA: Diagnosis not present

## 2023-10-21 DIAGNOSIS — E1122 Type 2 diabetes mellitus with diabetic chronic kidney disease: Secondary | ICD-10-CM | POA: Diagnosis not present

## 2023-10-22 DIAGNOSIS — L988 Other specified disorders of the skin and subcutaneous tissue: Secondary | ICD-10-CM | POA: Diagnosis not present

## 2023-10-28 DIAGNOSIS — R509 Fever, unspecified: Secondary | ICD-10-CM | POA: Diagnosis not present

## 2023-10-30 DIAGNOSIS — G450 Vertebro-basilar artery syndrome: Secondary | ICD-10-CM | POA: Diagnosis not present

## 2023-10-30 DIAGNOSIS — I5030 Unspecified diastolic (congestive) heart failure: Secondary | ICD-10-CM | POA: Diagnosis not present

## 2023-10-30 DIAGNOSIS — I480 Paroxysmal atrial fibrillation: Secondary | ICD-10-CM | POA: Diagnosis not present

## 2023-10-30 DIAGNOSIS — E1122 Type 2 diabetes mellitus with diabetic chronic kidney disease: Secondary | ICD-10-CM | POA: Diagnosis not present

## 2023-11-18 ENCOUNTER — Encounter (HOSPITAL_COMMUNITY): Payer: Self-pay | Admitting: Internal Medicine

## 2023-11-18 ENCOUNTER — Emergency Department (HOSPITAL_COMMUNITY)

## 2023-11-18 ENCOUNTER — Other Ambulatory Visit: Payer: Self-pay

## 2023-11-18 ENCOUNTER — Inpatient Hospital Stay (HOSPITAL_COMMUNITY)
Admission: EM | Admit: 2023-11-18 | Discharge: 2023-11-28 | DRG: 291 | Disposition: E | Attending: Family Medicine | Admitting: Family Medicine

## 2023-11-18 DIAGNOSIS — R4182 Altered mental status, unspecified: Secondary | ICD-10-CM | POA: Diagnosis present

## 2023-11-18 DIAGNOSIS — N1832 Chronic kidney disease, stage 3b: Secondary | ICD-10-CM | POA: Diagnosis present

## 2023-11-18 DIAGNOSIS — J9601 Acute respiratory failure with hypoxia: Secondary | ICD-10-CM | POA: Diagnosis not present

## 2023-11-18 DIAGNOSIS — I2583 Coronary atherosclerosis due to lipid rich plaque: Secondary | ICD-10-CM | POA: Diagnosis not present

## 2023-11-18 DIAGNOSIS — Z7984 Long term (current) use of oral hypoglycemic drugs: Secondary | ICD-10-CM

## 2023-11-18 DIAGNOSIS — Z833 Family history of diabetes mellitus: Secondary | ICD-10-CM | POA: Diagnosis not present

## 2023-11-18 DIAGNOSIS — E1169 Type 2 diabetes mellitus with other specified complication: Secondary | ICD-10-CM | POA: Diagnosis present

## 2023-11-18 DIAGNOSIS — R0602 Shortness of breath: Secondary | ICD-10-CM | POA: Diagnosis not present

## 2023-11-18 DIAGNOSIS — D631 Anemia in chronic kidney disease: Secondary | ICD-10-CM | POA: Diagnosis present

## 2023-11-18 DIAGNOSIS — Z885 Allergy status to narcotic agent status: Secondary | ICD-10-CM

## 2023-11-18 DIAGNOSIS — E1122 Type 2 diabetes mellitus with diabetic chronic kidney disease: Secondary | ICD-10-CM | POA: Diagnosis present

## 2023-11-18 DIAGNOSIS — D649 Anemia, unspecified: Secondary | ICD-10-CM | POA: Diagnosis not present

## 2023-11-18 DIAGNOSIS — J90 Pleural effusion, not elsewhere classified: Secondary | ICD-10-CM | POA: Diagnosis not present

## 2023-11-18 DIAGNOSIS — Z90710 Acquired absence of both cervix and uterus: Secondary | ICD-10-CM

## 2023-11-18 DIAGNOSIS — I2510 Atherosclerotic heart disease of native coronary artery without angina pectoris: Secondary | ICD-10-CM | POA: Diagnosis present

## 2023-11-18 DIAGNOSIS — E7849 Other hyperlipidemia: Secondary | ICD-10-CM | POA: Diagnosis present

## 2023-11-18 DIAGNOSIS — Z7982 Long term (current) use of aspirin: Secondary | ICD-10-CM

## 2023-11-18 DIAGNOSIS — E785 Hyperlipidemia, unspecified: Secondary | ICD-10-CM

## 2023-11-18 DIAGNOSIS — I110 Hypertensive heart disease with heart failure: Secondary | ICD-10-CM | POA: Diagnosis not present

## 2023-11-18 DIAGNOSIS — I5043 Acute on chronic combined systolic (congestive) and diastolic (congestive) heart failure: Secondary | ICD-10-CM | POA: Diagnosis present

## 2023-11-18 DIAGNOSIS — Z9841 Cataract extraction status, right eye: Secondary | ICD-10-CM

## 2023-11-18 DIAGNOSIS — Z9842 Cataract extraction status, left eye: Secondary | ICD-10-CM

## 2023-11-18 DIAGNOSIS — J811 Chronic pulmonary edema: Secondary | ICD-10-CM | POA: Diagnosis not present

## 2023-11-18 DIAGNOSIS — Z8701 Personal history of pneumonia (recurrent): Secondary | ICD-10-CM

## 2023-11-18 DIAGNOSIS — Z882 Allergy status to sulfonamides status: Secondary | ICD-10-CM

## 2023-11-18 DIAGNOSIS — I130 Hypertensive heart and chronic kidney disease with heart failure and stage 1 through stage 4 chronic kidney disease, or unspecified chronic kidney disease: Secondary | ICD-10-CM | POA: Diagnosis present

## 2023-11-18 DIAGNOSIS — Z515 Encounter for palliative care: Secondary | ICD-10-CM

## 2023-11-18 DIAGNOSIS — Z7189 Other specified counseling: Secondary | ICD-10-CM | POA: Diagnosis not present

## 2023-11-18 DIAGNOSIS — Z87891 Personal history of nicotine dependence: Secondary | ICD-10-CM

## 2023-11-18 DIAGNOSIS — I5033 Acute on chronic diastolic (congestive) heart failure: Secondary | ICD-10-CM | POA: Insufficient documentation

## 2023-11-18 DIAGNOSIS — E118 Type 2 diabetes mellitus with unspecified complications: Secondary | ICD-10-CM | POA: Diagnosis not present

## 2023-11-18 DIAGNOSIS — Z9049 Acquired absence of other specified parts of digestive tract: Secondary | ICD-10-CM

## 2023-11-18 DIAGNOSIS — Z961 Presence of intraocular lens: Secondary | ICD-10-CM | POA: Diagnosis present

## 2023-11-18 DIAGNOSIS — I480 Paroxysmal atrial fibrillation: Secondary | ICD-10-CM | POA: Diagnosis present

## 2023-11-18 DIAGNOSIS — Z79899 Other long term (current) drug therapy: Secondary | ICD-10-CM

## 2023-11-18 DIAGNOSIS — I509 Heart failure, unspecified: Secondary | ICD-10-CM | POA: Diagnosis not present

## 2023-11-18 DIAGNOSIS — J810 Acute pulmonary edema: Secondary | ICD-10-CM | POA: Diagnosis not present

## 2023-11-18 DIAGNOSIS — I5032 Chronic diastolic (congestive) heart failure: Secondary | ICD-10-CM | POA: Diagnosis present

## 2023-11-18 DIAGNOSIS — Z8249 Family history of ischemic heart disease and other diseases of the circulatory system: Secondary | ICD-10-CM

## 2023-11-18 DIAGNOSIS — Z888 Allergy status to other drugs, medicaments and biological substances status: Secondary | ICD-10-CM

## 2023-11-18 DIAGNOSIS — M1990 Unspecified osteoarthritis, unspecified site: Secondary | ICD-10-CM | POA: Diagnosis present

## 2023-11-18 DIAGNOSIS — Z96612 Presence of left artificial shoulder joint: Secondary | ICD-10-CM | POA: Diagnosis present

## 2023-11-18 DIAGNOSIS — E876 Hypokalemia: Secondary | ICD-10-CM | POA: Diagnosis present

## 2023-11-18 DIAGNOSIS — J189 Pneumonia, unspecified organism: Secondary | ICD-10-CM | POA: Diagnosis not present

## 2023-11-18 DIAGNOSIS — I10 Essential (primary) hypertension: Secondary | ICD-10-CM | POA: Diagnosis present

## 2023-11-18 DIAGNOSIS — Z7901 Long term (current) use of anticoagulants: Secondary | ICD-10-CM | POA: Diagnosis not present

## 2023-11-18 DIAGNOSIS — R918 Other nonspecific abnormal finding of lung field: Secondary | ICD-10-CM | POA: Diagnosis not present

## 2023-11-18 DIAGNOSIS — Z96611 Presence of right artificial shoulder joint: Secondary | ICD-10-CM | POA: Diagnosis not present

## 2023-11-18 DIAGNOSIS — Z66 Do not resuscitate: Secondary | ICD-10-CM | POA: Diagnosis not present

## 2023-11-18 LAB — I-STAT CHEM 8, ED
BUN: 23 mg/dL (ref 8–23)
Calcium, Ion: 1.12 mmol/L — ABNORMAL LOW (ref 1.15–1.40)
Chloride: 106 mmol/L (ref 98–111)
Creatinine, Ser: 1.3 mg/dL — ABNORMAL HIGH (ref 0.44–1.00)
Glucose, Bld: 245 mg/dL — ABNORMAL HIGH (ref 70–99)
HCT: 34 % — ABNORMAL LOW (ref 36.0–46.0)
Hemoglobin: 11.6 g/dL — ABNORMAL LOW (ref 12.0–15.0)
Potassium: 3.6 mmol/L (ref 3.5–5.1)
Sodium: 141 mmol/L (ref 135–145)
TCO2: 23 mmol/L (ref 22–32)

## 2023-11-18 LAB — COMPREHENSIVE METABOLIC PANEL WITH GFR
ALT: 16 U/L (ref 0–44)
AST: 20 U/L (ref 15–41)
Albumin: 3.8 g/dL (ref 3.5–5.0)
Alkaline Phosphatase: 49 U/L (ref 38–126)
Anion gap: 10 (ref 5–15)
BUN: 22 mg/dL (ref 8–23)
CO2: 20 mmol/L — ABNORMAL LOW (ref 22–32)
Calcium: 8.5 mg/dL — ABNORMAL LOW (ref 8.9–10.3)
Chloride: 105 mmol/L (ref 98–111)
Creatinine, Ser: 1.35 mg/dL — ABNORMAL HIGH (ref 0.44–1.00)
GFR, Estimated: 36 mL/min — ABNORMAL LOW (ref >60)
Glucose, Bld: 230 mg/dL — ABNORMAL HIGH (ref 70–99)
Potassium: 3.4 mmol/L — ABNORMAL LOW (ref 3.5–5.1)
Sodium: 135 mmol/L (ref 135–145)
Total Bilirubin: 1.1 mg/dL (ref 0.0–1.2)
Total Protein: 7.2 g/dL (ref 6.5–8.1)

## 2023-11-18 LAB — BRAIN NATRIURETIC PEPTIDE: B Natriuretic Peptide: 240.9 pg/mL — ABNORMAL HIGH (ref 0.0–100.0)

## 2023-11-18 LAB — CBC WITH DIFFERENTIAL/PLATELET
Abs Immature Granulocytes: 0 K/uL (ref 0.00–0.07)
Basophils Absolute: 0.1 K/uL (ref 0.0–0.1)
Basophils Relative: 1 %
Eosinophils Absolute: 0.1 K/uL (ref 0.0–0.5)
Eosinophils Relative: 1 %
HCT: 36.2 % (ref 36.0–46.0)
Hemoglobin: 12.2 g/dL (ref 12.0–15.0)
Immature Granulocytes: 0 %
Lymphocytes Relative: 20 %
Lymphs Abs: 1.9 K/uL (ref 0.7–4.0)
MCH: 34.4 pg — ABNORMAL HIGH (ref 26.0–34.0)
MCHC: 33.7 g/dL (ref 30.0–36.0)
MCV: 102 fL — ABNORMAL HIGH (ref 80.0–100.0)
Monocytes Absolute: 0.7 K/uL (ref 0.1–1.0)
Monocytes Relative: 7 %
Neutro Abs: 6.5 K/uL (ref 1.7–7.7)
Neutrophils Relative %: 71 %
Platelets: 229 K/uL (ref 150–400)
RBC: 3.55 MIL/uL — ABNORMAL LOW (ref 3.87–5.11)
RDW: 13.5 % (ref 11.5–15.5)
WBC: 9.1 K/uL (ref 4.0–10.5)

## 2023-11-18 LAB — TROPONIN I (HIGH SENSITIVITY)
Troponin I (High Sensitivity): 23 ng/L — ABNORMAL HIGH (ref <18)
Troponin I (High Sensitivity): 25 ng/L — ABNORMAL HIGH (ref <18)

## 2023-11-18 LAB — MAGNESIUM: Magnesium: 1.7 mg/dL (ref 1.7–2.4)

## 2023-11-18 LAB — GLUCOSE, CAPILLARY: Glucose-Capillary: 145 mg/dL — ABNORMAL HIGH (ref 70–99)

## 2023-11-18 MED ORDER — ROSUVASTATIN CALCIUM 20 MG PO TABS
20.0000 mg | ORAL_TABLET | Freq: Every day | ORAL | Status: DC
  Administered 2023-11-18 – 2023-11-19 (×2): 20 mg via ORAL
  Filled 2023-11-18 (×2): qty 1

## 2023-11-18 MED ORDER — ACETAMINOPHEN 650 MG RE SUPP: 650.0000 mg | Freq: Four times a day (QID) | RECTAL | Status: DC | PRN

## 2023-11-18 MED ORDER — ACETAMINOPHEN 325 MG PO TABS
650.0000 mg | ORAL_TABLET | Freq: Four times a day (QID) | ORAL | Status: DC | PRN
  Administered 2023-11-18 – 2023-11-20 (×3): 650 mg via ORAL
  Filled 2023-11-18 (×3): qty 2

## 2023-11-18 MED ORDER — SODIUM CHLORIDE 0.9% FLUSH
3.0000 mL | Freq: Two times a day (BID) | INTRAVENOUS | Status: DC
  Administered 2023-11-18 – 2023-11-21 (×7): 3 mL via INTRAVENOUS

## 2023-11-18 MED ORDER — APIXABAN 2.5 MG PO TABS
2.5000 mg | ORAL_TABLET | Freq: Two times a day (BID) | ORAL | Status: DC
  Administered 2023-11-18 – 2023-11-20 (×4): 2.5 mg via ORAL
  Filled 2023-11-18 (×4): qty 1

## 2023-11-18 MED ORDER — HYDRALAZINE HCL 25 MG PO TABS
37.5000 mg | ORAL_TABLET | Freq: Three times a day (TID) | ORAL | Status: DC
  Administered 2023-11-18 – 2023-11-20 (×6): 37.5 mg via ORAL
  Filled 2023-11-18 (×6): qty 2

## 2023-11-18 MED ORDER — FUROSEMIDE 10 MG/ML IJ SOLN
60.0000 mg | Freq: Two times a day (BID) | INTRAMUSCULAR | Status: DC
  Administered 2023-11-19 – 2023-11-20 (×3): 60 mg via INTRAVENOUS
  Filled 2023-11-18 (×3): qty 6

## 2023-11-18 MED ORDER — PANTOPRAZOLE SODIUM 40 MG PO TBEC
40.0000 mg | DELAYED_RELEASE_TABLET | Freq: Every day | ORAL | Status: DC
  Administered 2023-11-19: 40 mg via ORAL
  Filled 2023-11-18: qty 1

## 2023-11-18 MED ORDER — CLONIDINE HCL 0.1 MG PO TABS
0.1000 mg | ORAL_TABLET | Freq: Every evening | ORAL | Status: DC
Start: 2023-11-18 — End: 2023-11-20
  Administered 2023-11-18 – 2023-11-19 (×2): 0.1 mg via ORAL
  Filled 2023-11-18 (×2): qty 1

## 2023-11-18 MED ORDER — AMLODIPINE BESYLATE 10 MG PO TABS
10.0000 mg | ORAL_TABLET | Freq: Every day | ORAL | Status: DC
Start: 2023-11-18 — End: 2023-11-20
  Administered 2023-11-18 – 2023-11-19 (×2): 10 mg via ORAL
  Filled 2023-11-18 (×2): qty 1

## 2023-11-18 MED ORDER — CLONIDINE HCL 0.2 MG PO TABS
0.2000 mg | ORAL_TABLET | Freq: Every morning | ORAL | Status: DC
  Administered 2023-11-19 – 2023-11-20 (×2): 0.2 mg via ORAL
  Filled 2023-11-18 (×2): qty 1

## 2023-11-18 MED ORDER — POLYETHYLENE GLYCOL 3350 17 G PO PACK: 17.0000 g | PACK | Freq: Every day | ORAL | Status: DC | PRN

## 2023-11-18 MED ORDER — ASPIRIN 81 MG PO TBEC
81.0000 mg | DELAYED_RELEASE_TABLET | Freq: Every day | ORAL | Status: DC
  Administered 2023-11-19 – 2023-11-20 (×2): 81 mg via ORAL
  Filled 2023-11-18 (×2): qty 1

## 2023-11-18 MED ORDER — ISOSORBIDE DINITRATE 20 MG PO TABS
20.0000 mg | ORAL_TABLET | Freq: Three times a day (TID) | ORAL | Status: DC
  Administered 2023-11-18 – 2023-11-20 (×5): 20 mg via ORAL
  Filled 2023-11-18 (×8): qty 1

## 2023-11-18 MED ORDER — ONDANSETRON HCL 4 MG/2ML IJ SOLN
4.0000 mg | Freq: Once | INTRAMUSCULAR | Status: AC
Start: 2023-11-18 — End: 2023-11-18
  Administered 2023-11-18: 4 mg via INTRAVENOUS
  Filled 2023-11-18: qty 2

## 2023-11-18 MED ORDER — FUROSEMIDE 10 MG/ML IJ SOLN
20.0000 mg | Freq: Once | INTRAMUSCULAR | Status: AC
  Administered 2023-11-18: 20 mg via INTRAVENOUS
  Filled 2023-11-18: qty 2

## 2023-11-18 MED ORDER — CLONIDINE HCL 0.1 MG PO TABS: 0.1000 mg | ORAL_TABLET | Freq: Two times a day (BID) | ORAL | Status: DC

## 2023-11-18 MED ORDER — AMLODIPINE BESYLATE 10 MG PO TABS: 10.0000 mg | ORAL_TABLET | Freq: Every day | ORAL | Status: DC

## 2023-11-18 MED ORDER — ASPIRIN 325 MG PO TBEC
325.0000 mg | DELAYED_RELEASE_TABLET | Freq: Once | ORAL | Status: AC
  Administered 2023-11-18: 325 mg via ORAL
  Filled 2023-11-18: qty 1

## 2023-11-18 MED ORDER — RANOLAZINE ER 500 MG PO TB12
500.0000 mg | ORAL_TABLET | Freq: Two times a day (BID) | ORAL | Status: DC
  Administered 2023-11-18 – 2023-11-20 (×4): 500 mg via ORAL
  Filled 2023-11-18 (×4): qty 1

## 2023-11-18 MED ORDER — INSULIN ASPART 100 UNIT/ML IJ SOLN
0.0000 [IU] | Freq: Three times a day (TID) | INTRAMUSCULAR | Status: DC
  Administered 2023-11-18: 2 [IU] via SUBCUTANEOUS
  Administered 2023-11-19: 3 [IU] via SUBCUTANEOUS
  Administered 2023-11-19: 5 [IU] via SUBCUTANEOUS
  Administered 2023-11-19: 3 [IU] via SUBCUTANEOUS
  Administered 2023-11-20: 2 [IU] via SUBCUTANEOUS
  Administered 2023-11-20: 8 [IU] via SUBCUTANEOUS

## 2023-11-18 NOTE — ED Provider Notes (Signed)
 Care assumed from previous team.  At time of transfer of care, patient is awaiting callback for admission for new oxygen  requirement with evidence of pulm edema and fluid overload.   Ophie Burrowes, Lonni PARAS, MD 11/18/23 [phone]

## 2023-11-18 NOTE — Plan of Care (Signed)

## 2023-11-18 NOTE — H&P (Signed)
 History and Physical   Allison Duffy FMW:[id number] DOB: [birth year] DOA: 11/18/2023  PCP: Allison Ade, MD   Patient coming from: Home  Chief Complaint: Shortness of breath, chest pressure, edema  HPI: Allison Duffy is a 89 y.o. female with medical history significant of hypertension, hyperlipidemia, diabetes, CKD 3B, A-fib, CAD, chronic diastolic CHF, anemia presenting with shortness of breath.  Patient was recently discharged from SNF a few days ago after insurance payments ran out and family had paid for 3 additional days.  Reportedly recently treated for pneumonia.  Patient had increased shortness of breath overnight and has been having some lower extremity edema.  Also reports some associated nausea and had some chest pressure on presentation which has improved.  Initially when patient arrived she had stated that she did not want any interventions done, but after daughter arrived and they had further discussion that she has opted for further interventions but does remain DNR/DNI.  Denies fevers, chills, abdominal pain, constipation, diarrhea.  ED Course: Vital signs in the ED notable for blood pressure in the 170s-200s systolic.  Requiring 6 L to maintain saturations, 83% on room air.  Lab workup included CMP with potassium 3.4, bicarb 20, creatinine stable 1.35, glucose 230, calcium  8.5.  CBC within normal limits.  Troponin 23 with repeat pending.  BNP borderline at 240.  Chest x-ray showed mild pulmonary edema with small left pleural effusion.  Also noted was retrocardiac opacity suspicious for atelectasis versus aspiration versus pneumonia.  Patient received aspirin  and Lasix  in the ED.  Reportedly diuresed almost 2 L already.  Review of Systems: As per HPI otherwise all other systems reviewed and are negative.  Past Medical History:  Diagnosis Date   Acute on chronic combined systolic and diastolic CHF (congestive heart failure) (HCC) 01/28/2019   Acute respiratory  failure with hypoxia (HCC) 03/29/2021   Acute respiratory failure with hypoxia secondary to community acquired pneumonia 03/29/2021   Arthritis    scattered joints (07/29/2013)   Chest pain    Colitis    Colitis, acute 05/31/2016   Gout attack ?1980's   Hyperlipidemia    Hypertension    Lactic acidosis 03/29/2021   PONV (postoperative nausea and vomiting)    Type II diabetes mellitus (HCC)     Past Surgical History:  Procedure Laterality Date   APPENDECTOMY     CARPAL TUNNEL RELEASE Right    CATARACT EXTRACTION W/ INTRAOCULAR LENS  IMPLANT, BILATERAL Bilateral    CHOLECYSTECTOMY     KNEE ARTHROSCOPY Right    LEFT HEART CATH AND CORONARY ANGIOGRAPHY N/A 04/12/2020   Procedure: LEFT HEART CATH AND CORONARY ANGIOGRAPHY;  Surgeon: Allison Dorn PARAS, MD;  Location: [facility];  Service: Cardiovascular;  Laterality: N/A;   OLECRANON BURSECTOMY Left 07/29/2013   in ER   PARATHYROIDECTOMY     REVERSE SHOULDER ARTHROPLASTY Left 03/27/2023   Procedure: REVERSE TOTAL  SHOULDER ARTHROPLASTY WITH CLOSED FIXATION OF DISTAL RADIUS;  Surgeon: Allison Selinda Dover, MD;  Location: [facility] OR;  Service: Orthopedics;  Laterality: Left;   TONSILLECTOMY     TOTAL SHOULDER REPLACEMENT  2017   VAGINAL HYSTERECTOMY      Social History  reports that she quit smoking about 70 years ago. Her smoking use included cigarettes. She started smoking about 75 years ago. She has never used smokeless tobacco. She reports that she does not drink alcohol  and does not use drugs.  Allergies  Allergen Reactions   Codeine Nausea And Vomiting   Hydrocodone   Nausea And Vomiting    Nausea and vomiting   Morphine  Sulfate Nausea And Vomiting   Prednisone      Other reaction(s): sick on her stomach   Sulfa Antibiotics Itching, Nausea Only and Other (See Comments)    Family History  Problem Relation Age of Onset   Hypertension Father    Heart disease Father    Heart disease Mother    Diabetes Mother     Hypertension Mother    Heart disease Brother        both brothers   Diabetes Brother    Hypertension Brother   Reviewed on admission  Prior to Admission medications   Medication Sig Start Date End Date Taking? Authorizing Provider  amLODipine  (NORVASC ) 10 MG tablet Take 10 mg by mouth at bedtime.    [provider]  apixaban  (ELIQUIS ) 2.5 MG TABS tablet Take 2.5 mg by mouth 2 (two) times daily. Patient not taking: Reported on 10/16/2023    [provider]  aspirin  EC 81 MG tablet Take 81 mg by mouth daily. Swallow whole.    [provider]  bisacodyl  (DULCOLAX) 5 MG EC tablet Take 1 tablet (5 mg total) by mouth daily as needed for moderate constipation. 10/19/23   Allison Reyes DEL, MD  carbamide peroxide (DEBROX) 6.5 % OTIC solution Place 5 drops into the left ear 2 (two) times daily. X 7 days 10/19/23 10/18/24  Allison Reyes DEL, MD  carboxymethylcellulose 1 % ophthalmic solution Place 1 drop into both eyes 2 (two) times daily.    [provider]  cholecalciferol (VITAMIN D ) 1000 UNITS tablet Take 1,000 Units by mouth daily with lunch.    [provider]  cholestyramine  (QUESTRAN ) 4 g packet Take 4 g by mouth daily as needed (IBS).    [provider]  cloNIDine  (CATAPRES ) 0.1 MG tablet TAKE 2 TABLETS BY MOUTH EVERY MORNING AND TAKE 1 TABLET BY MOUTH EVERY NIGHT AT BEDTIME 12/29/22   Allison Dorn PARAS, MD  furosemide  (LASIX ) 20 MG tablet Take 1 tablet (20 mg total) by mouth daily. Patient taking differently: Take 40 mg by mouth daily. 08/27/22 10/16/23  Allison Josefa HERO, NP  hydrALAZINE  (APRESOLINE ) 25 MG tablet TAKE 1 1/2 TABLETS(37.5 MG) THREE TIMES DAILY 11/11/22   Allison Dorn PARAS, MD  isosorbide  dinitrate (ISORDIL ) 20 MG tablet TAKE 1 TABLET BY MOUTH THREE TIMES DAILY. TAKE WITH HYDRALAZINE  37.5MG (1 AND A 1/2 TABLETS) 11/11/22   Allison Dorn PARAS, MD  meclizine  (ANTIVERT ) 12.5 MG tablet Take 1 tablet (12.5 mg total) by mouth 3 (three) times  daily as needed for dizziness. 10/19/23   Allison Reyes DEL, MD  metFORMIN  (GLUCOPHAGE ) 500 MG tablet Take 1 tablet (500 mg total) by mouth daily with breakfast. Patient taking differently: Take 500 mg by mouth in the morning and at bedtime. 03/30/23   Mdala-Gausi, Masiku Agatha, MD  Multiple Vitamin (MULTIVITAMIN WITH MINERALS) TABS Take 1 tablet by mouth daily with supper.    [provider]  nitroGLYCERIN  (NITROSTAT ) 0.4 MG SL tablet PLACE 1 TABLET (0.4 MG TOTAL) UNDER THE TONGUE EVERY FIVE MINUTES X 3 DOSES AS NEEDED FOR CHEST PAIN. 04/13/20 10/16/23  Anner Alm ORN, MD  omeprazole  (PRILOSEC) 40 MG capsule TAKE 1 CAPSULE BY MOUTH DAILY WITH SUPPER 01/15/22   Allison Dorn PARAS, MD  potassium chloride  (KLOR-CON ) 10 MEQ tablet TAKE 1 TABLET(10 MEQ) BY MOUTH DAILY-- MAY TAKE AN EXTRA WITH A WEIGHT INCREASE OF 2LBS OVERNIGHT OR 5 LBS TOTAL AS DIRECTED Patient  taking differently: 20 mEq daily. 11/11/22   Allison Dorn PARAS, MD  Probiotic Product (PRO-BIOTIC BLEND) CAPS Take 1 capsule by mouth at bedtime. Takes OTC probiotic    Allison Ade, MD  ranolazine  (RANEXA ) 500 MG 12 hr tablet TAKE 1 TABLET(500 MG) BY MOUTH TWICE DAILY 11/11/22   Allison Dorn PARAS, MD  rosuvastatin  (CRESTOR ) 20 MG tablet Take 20 mg by mouth at bedtime.    [provider]  sitaGLIPtin (JANUVIA) 100 MG tablet Take 100 mg by mouth daily with lunch.    [provider]    Physical Exam: Vitals:   11/18/23 1111 11/18/23 1248 11/18/23 1415 11/18/23 1434  BP: (!) 209/78   (!) 172/86  Pulse: 79  81 80  Resp: 19  (!) 24 16  Temp: 98.1 F (36.7 C)     SpO2: 99% 95% 96% 93%  Weight: 63.5 kg     Height: 5' 6 (1.676 m)       Physical Exam Constitutional:      General: She is not in acute distress.    Appearance: Normal appearance.  HENT:     Head: Normocephalic and atraumatic.     Mouth/Throat:     Mouth: Mucous membranes are moist.     Pharynx: Oropharynx is clear.  Eyes:     Extraocular  Movements: Extraocular movements intact.     Pupils: Pupils are equal, round, and reactive to light.  Cardiovascular:     Rate and Rhythm: Normal rate and regular rhythm.     Pulses: Normal pulses.     Heart sounds: Normal heart sounds.  Pulmonary:     Effort: Pulmonary effort is normal. No respiratory distress.     Breath sounds: Rales present.  Abdominal:     General: Bowel sounds are normal. There is no distension.     Palpations: Abdomen is soft.     Tenderness: There is no abdominal tenderness.  Musculoskeletal:        General: No swelling or deformity.     Right lower leg: Edema present.     Left lower leg: Edema present.  Skin:    General: Skin is warm and dry.  Neurological:     General: No focal deficit present.     Mental Status: Mental status is at baseline.    Labs on Admission: I have personally reviewed following labs and imaging studies  CBC: Recent Labs  Lab 11/18/23 1140 11/18/23 1149  WBC 9.1  --   NEUTROABS 6.5  --   HGB 12.2 11.6*  HCT 36.2 34.0*  MCV 102.0*  --   PLT 229  --     Basic Metabolic Panel: Recent Labs  Lab 11/18/23 1140 11/18/23 1149  NA 135 141  K 3.4* 3.6  CL 105 106  CO2 20*  --   GLUCOSE 230* 245*  BUN 22 23  CREATININE 1.35* 1.30*  CALCIUM  8.5*  --     GFR: Estimated Creatinine Clearance: 24.2 mL/min (A) (by C-G formula based on SCr of 1.3 mg/dL (H)).  Liver Function Tests: Recent Labs  Lab 11/18/23 1140  AST 20  ALT 16  ALKPHOS 49  BILITOT 1.1  PROT 7.2  ALBUMIN  3.8    Urine analysis:    Component Value Date/Time   COLORURINE YELLOW 10/16/2023 1615   APPEARANCEUR CLEAR 10/16/2023 1615   LABSPEC 1.018 10/16/2023 1615   PHURINE 5.0 10/16/2023 1615   GLUCOSEU NEGATIVE 10/16/2023 1615   HGBUR NEGATIVE 10/16/2023 1615   BILIRUBINUR  NEGATIVE 10/16/2023 1615   KETONESUR NEGATIVE 10/16/2023 1615   PROTEINUR 30 (A) 10/16/2023 1615   UROBILINOGEN 0.2 06/23/2014 1140   NITRITE NEGATIVE 10/16/2023 1615    LEUKOCYTESUR NEGATIVE 10/16/2023 1615    Radiological Exams on Admission: DG Chest Portable 1 View Result Date: 11/18/2023 CLINICAL DATA:  Hypoxia EXAM: PORTABLE CHEST 1 VIEW COMPARISON:  Chest radiograph dated 10/16/2023 FINDINGS: Normal lung volumes. Increased diffuse interstitial opacities. Dense left retrocardiac opacity. Small left pleural effusion. No pneumothorax. Similar enlarged cardiomediastinal silhouette. No acute osseous abnormality. Bilateral shoulder arthroplasties. Surgical clips project over the superior mediastinum. IMPRESSION: 1. Mild pulmonary edema and small left pleural effusion. 2. Dense left retrocardiac opacity, likely atelectasis. Aspiration or pneumonia can be considered in the appropriate clinical setting. Electronically Signed   By: Limin  Xu M.D.   On: 11/18/2023 12:08   EKG: Independently reviewed.  Sinus rhythm at 87 bpm.  Nonspecific T wave changes.  Minimal baseline wander and minimal baseline artifact.    Assessment/Plan Active Problems:   Coronary artery disease   Type 2 diabetes mellitus with complication, without long-term current use of insulin  (HCC)   Essential hypertension   Hyperlipidemia associated with type 2 diabetes mellitus (HCC)   Chronic diastolic HF (heart failure) (HCC)   Paroxysmal atrial fibrillation (HCC)   CKD stage 3b, GFR 30-44 ml/min (HCC)   Anemia   Acute respiratory failure with hypoxia (HCC)   Acute on chronic diastolic (congestive) heart failure (HCC)   Acute respiratory failure with hypoxia Acute on chronic diastolic CHF > Present with worsening shortness of breath.  Noted to have associated lower extremity edema. > Initially had some chest pressure in the setting of significantly elevated blood pressure but this has improved with diuresis in the ED. > Troponin mildly elevated at 23, repeat pending.  BNP 240. > Chest x-ray with pulmonary edema and small left pleural effusion.  Retrocardiac opacity possibly representing  atelectasis, unable to radiologically rule out aspiration/pneumonia. > Presentation most suspicious for respiratory failure is secondary to CHF exacerbation.  Improving with initial diuresis of almost 2 L in the ED. > Last echocardiogram was last month with EF of 50-60%, G3 DD, normal RV function. -Monitor on progressive unit overnight - Continue with supplemental oxygen , wean as tolerated - Continue with Lasix  60 mg IV twice daily - Trend renal function and electrolytes - Check magnesium  - Strict I's and O's, daily weights - No repeat echocardiogram, recently done  Hypertension - Continue home amlodipine , clonidine  - Lasix  as above  Hyperlipidemia - Continue home rosuvastatin   Diabetes - SSI  CKD 3B > Creatinine stable at 1.35 - Trend renal function and electrolytes  Atrial fibrillation - Continue home Eliquis   CAD - Continue home ASA, rosuvastatin , Eliquis   Anemia > Normal in the ED - Trend CBC  DVT prophylaxis: Eliquis  Code Status:   DNR/DNI Family Communication:   Updated at bedside Disposition Plan:   Patient is from:  Home  Anticipated DC to:  Pending clinical course  Anticipated DC date:  1 to 3 days  Anticipated DC barriers: None  Consults called:  None Admission status:  Observation, progressive  Severity of Illness: The appropriate patient status for this patient is OBSERVATION. Observation status is judged to be reasonable and necessary in order to provide the required intensity of service to ensure the patient's safety. The patient's presenting symptoms, physical exam findings, and initial radiographic and laboratory data in the context of their medical condition is felt to place them at decreased risk  for further clinical deterioration. Furthermore, it is anticipated that the patient will be medically stable for discharge from the hospital within 2 midnights of admission.    Marsa KATHEE Scurry MD [facility]  How to contact the [facility] Attending or  Consulting provider 7A - 7P or covering provider during after hours 7P -7A, for this patient?   Check the care team in [facility] and look for a) attending/consulting [facility] provider listed and b) the [facility] team listed Log into [email or web address] and use [facility]'s universal password to access. If you do not have the password, please contact the hospital operator. Locate the [facility] provider you are looking for under [facility] and page to a number that you can be directly reached. If you still have difficulty reaching the provider, please page the [facility] (Director on Call) for the Hospitalists listed on amion for assistance.  11/18/2023, 3:33 PM

## 2023-11-18 NOTE — ED Provider Notes (Signed)
 [place] EMERGENCY DEPARTMENT AT [facility] Provider Note   CSN: [id number] Arrival date & time: 11/18/23  1057     Patient presents with: Altered Mental Status   Allison Duffy is a 89 y.o. female.   HPI   89 y.o. female with medical history significant for A-fib recently discontinued anticoagulation, HTN, HLD, chronic diastolic HF, CKD 3B, arthritis, T2DM and CAD who presents to the emergency department with a chief complaint of hypoxia and confusion.  The history was provided by EMS, the patient and the patient's daughter who is her healthcare POA.  They state that the patient was just discharged from her nursing facility within the past few days.  The patient's daughter states that the patient had run out of insurance coverage for the facility and they actually paid for an extra 3 days out-of-pocket to keep her in the facility while she was out of town.  She was just treated with antibiotics for pneumonia.    She is on Eliquis , has a history of atrial fibrillation.  She states that she developed acute onset shortness of breath overnight.  She has bilateral lower extremity swelling.  She endorses left-sided pressure in her chest.  She initially refused care stating I just want to go but subsequently acquiesced to an IV placement, EKG and labs in addition to chest x-ray.  She arrives with a MOST form, it is currently confirmed DNR limited.  Her daughter arrived at bedside and confirmed she would like everything short of CPR and intubation.  Okay with medications IV and initial workup.   Per EMS, the patient was notably hypoxic to 83% on room air and more slow to respond.  EMS administered 6 L O2 via nasal cannula and the patient became more responsive.  She refused an IV with EMS initially.  Her O2 saturations improved with 6 L O2 via nasal cannula.  She endorses mild nausea.  Prior to Admission medications   Medication Sig Start Date End Date Taking? Authorizing Provider   amLODipine  (NORVASC ) 10 MG tablet Take 10 mg by mouth at bedtime.    [provider]  apixaban  (ELIQUIS ) 2.5 MG TABS tablet Take 2.5 mg by mouth 2 (two) times daily. Patient not taking: Reported on 10/16/2023    [provider]  aspirin  EC 81 MG tablet Take 81 mg by mouth daily. Swallow whole.    [provider]  bisacodyl  (DULCOLAX) 5 MG EC tablet Take 1 tablet (5 mg total) by mouth daily as needed for moderate constipation. 10/19/23   Elpidio Reyes DEL, MD  carbamide peroxide (DEBROX) 6.5 % OTIC solution Place 5 drops into the left ear 2 (two) times daily. X 7 days 10/19/23 10/18/24  Elpidio Reyes DEL, MD  carboxymethylcellulose 1 % ophthalmic solution Place 1 drop into both eyes 2 (two) times daily.    [provider]  cholecalciferol (VITAMIN D ) 1000 UNITS tablet Take 1,000 Units by mouth daily with lunch.    [provider]  cholestyramine  (QUESTRAN ) 4 g packet Take 4 g by mouth daily as needed (IBS).    [provider]  cloNIDine  (CATAPRES ) 0.1 MG tablet TAKE 2 TABLETS BY MOUTH EVERY MORNING AND TAKE 1 TABLET BY MOUTH EVERY NIGHT AT BEDTIME 12/29/22   Court Dorn PARAS, MD  furosemide  (LASIX ) 20 MG tablet Take 1 tablet (20 mg total) by mouth daily. Patient taking differently: Take 40 mg by mouth daily. 08/27/22 10/16/23  Emelia Josefa HERO, NP  hydrALAZINE  (APRESOLINE ) 25  MG tablet TAKE 1 1/2 TABLETS(37.5 MG) THREE TIMES DAILY 11/11/22   Court Dorn PARAS, MD  isosorbide  dinitrate (ISORDIL ) 20 MG tablet TAKE 1 TABLET BY MOUTH THREE TIMES DAILY. TAKE WITH HYDRALAZINE  37.5MG (1 AND A 1/2 TABLETS) 11/11/22   Court Dorn PARAS, MD  meclizine  (ANTIVERT ) 12.5 MG tablet Take 1 tablet (12.5 mg total) by mouth 3 (three) times daily as needed for dizziness. 10/19/23   Elpidio Reyes DEL, MD  metFORMIN  (GLUCOPHAGE ) 500 MG tablet Take 1 tablet (500 mg total) by mouth daily with breakfast. Patient taking differently: Take 500 mg by mouth in the morning and at  bedtime. 03/30/23   Mdala-Gausi, Masiku Agatha, MD  Multiple Vitamin (MULTIVITAMIN WITH MINERALS) TABS Take 1 tablet by mouth daily with supper.    [provider]  nitroGLYCERIN  (NITROSTAT ) 0.4 MG SL tablet PLACE 1 TABLET (0.4 MG TOTAL) UNDER THE TONGUE EVERY FIVE MINUTES X 3 DOSES AS NEEDED FOR CHEST PAIN. 04/13/20 10/16/23  Anner Alm ORN, MD  omeprazole  (PRILOSEC) 40 MG capsule TAKE 1 CAPSULE BY MOUTH DAILY WITH SUPPER 01/15/22   Court Dorn PARAS, MD  potassium chloride  (KLOR-CON ) 10 MEQ tablet TAKE 1 TABLET(10 MEQ) BY MOUTH DAILY-- MAY TAKE AN EXTRA WITH A WEIGHT INCREASE OF 2LBS OVERNIGHT OR 5 LBS TOTAL AS DIRECTED Patient taking differently: 20 mEq daily. 11/11/22   Court Dorn PARAS, MD  Probiotic Product (PRO-BIOTIC BLEND) CAPS Take 1 capsule by mouth at bedtime. Takes OTC probiotic    Shepard Ade, MD  ranolazine  (RANEXA ) 500 MG 12 hr tablet TAKE 1 TABLET(500 MG) BY MOUTH TWICE DAILY 11/11/22   Court Dorn PARAS, MD  rosuvastatin  (CRESTOR ) 20 MG tablet Take 20 mg by mouth at bedtime.    [provider]  sitaGLIPtin (JANUVIA) 100 MG tablet Take 100 mg by mouth daily with lunch.    [provider]    Allergies: Codeine, Hydrocodone , Morphine  sulfate, Prednisone , and Sulfa antibiotics    Review of Systems  Updated Vital Signs BP (!) 172/86   Pulse 80   Temp 98.1 F (36.7 C)   Resp 16   Ht 5' 6 (1.676 m)   Wt 63.5 kg   SpO2 93%   BMI 22.60 kg/m   Physical Exam Vitals and nursing note reviewed.  Constitutional:      General: She is not in acute distress.    Appearance: She is well-developed. She is not ill-appearing.  HENT:     Head: Normocephalic and atraumatic.  Eyes:     Conjunctiva/sclera: Conjunctivae normal.  Cardiovascular:     Rate and Rhythm: Normal rate and regular rhythm.     Heart sounds: No murmur heard. Pulmonary:     Effort: No respiratory distress.     Breath sounds: Normal breath sounds.     Comments: Pt in no  immediate respiratory distress, saturating 100% on 6L O2 via nasal cannula. Bibasilar crackles auscultated. Abdominal:     Palpations: Abdomen is soft.     Tenderness: There is no abdominal tenderness.  Musculoskeletal:     Cervical back: Neck supple.     Right lower leg: Edema present.     Left lower leg: Edema present.     Comments: 2+ bilateral pitting LE edema  Skin:    General: Skin is warm and dry.     Capillary Refill: Capillary refill takes less than 2 seconds.  Neurological:     Mental Status: She is alert.  Psychiatric:        Mood  and Affect: Mood is anxious. Affect is tearful.     (all labs ordered are listed, but only abnormal results are displayed) Labs Reviewed  CBC WITH DIFFERENTIAL/PLATELET - Abnormal; Notable for the following components:      Result Value   RBC 3.55 (*)    MCV 102.0 (*)    MCH 34.4 (*)    All other components within normal limits  COMPREHENSIVE METABOLIC PANEL WITH GFR - Abnormal; Notable for the following components:   Potassium 3.4 (*)    CO2 20 (*)    Glucose, Bld 230 (*)    Creatinine, Ser 1.35 (*)    Calcium  8.5 (*)    GFR, Estimated 36 (*)    All other components within normal limits  BRAIN NATRIURETIC PEPTIDE - Abnormal; Notable for the following components:   B Natriuretic Peptide 240.9 (*)    All other components within normal limits  I-STAT CHEM 8, ED - Abnormal; Notable for the following components:   Creatinine, Ser 1.30 (*)    Glucose, Bld 245 (*)    Calcium , Ion 1.12 (*)    Hemoglobin 11.6 (*)    HCT 34.0 (*)    All other components within normal limits  TROPONIN I (HIGH SENSITIVITY) - Abnormal; Notable for the following components:   Troponin I (High Sensitivity) 23 (*)    All other components within normal limits  TROPONIN I (HIGH SENSITIVITY)    EKG: EKG Interpretation Date/Time:  Wednesday November 18 2023 11:06:37 EDT Ventricular Rate:  87 PR Interval:    QRS Duration:  110 QT Interval:  390 QTC  Calculation: 475 R Axis:   32  Text Interpretation: Normal sinus rhythm Borderline repolarization abnormality Confirmed by Jerrol Agent ([id number]) on 11/18/2023 11:11:45 AM  Radiology: DG Chest Portable 1 View Result Date: 11/18/2023 CLINICAL DATA:  Hypoxia EXAM: PORTABLE CHEST 1 VIEW COMPARISON:  Chest radiograph dated 10/16/2023 FINDINGS: Normal lung volumes. Increased diffuse interstitial opacities. Dense left retrocardiac opacity. Small left pleural effusion. No pneumothorax. Similar enlarged cardiomediastinal silhouette. No acute osseous abnormality. Bilateral shoulder arthroplasties. Surgical clips project over the superior mediastinum. IMPRESSION: 1. Mild pulmonary edema and small left pleural effusion. 2. Dense left retrocardiac opacity, likely atelectasis. Aspiration or pneumonia can be considered in the appropriate clinical setting. Electronically Signed   By: Limin  Xu M.D.   On: 11/18/2023 12:08     .Critical Care  Performed by: Jerrol Agent, MD Authorized by: Jerrol Agent, MD   Critical care provider statement:    Critical care time (minutes):  30   Critical care was necessary to treat or prevent imminent or life-threatening deterioration of the following conditions:  Respiratory failure   Critical care was time spent personally by me on the following activities:  Development of treatment plan with patient or surrogate, discussions with consultants, evaluation of patient's response to treatment, examination of patient, ordering and review of laboratory studies, ordering and review of radiographic studies, ordering and performing treatments and interventions, pulse oximetry, re-evaluation of patient's condition and review of old charts   Care discussed with: admitting provider      Medications Ordered in the ED  aspirin  EC tablet 325 mg (325 mg Oral Given 11/18/23 1211)  ondansetron  (ZOFRAN ) injection 4 mg (4 mg Intravenous Given 11/18/23 1212)  furosemide  (LASIX ) injection 20  mg (20 mg Intravenous Given 11/18/23 1214)  Medical Decision Making Amount and/or Complexity of Data Reviewed Labs: ordered. Radiology: ordered.  Risk OTC drugs. Prescription drug management. Decision regarding hospitalization.   89 y.o. female with medical history significant for A-fib recently discontinued anticoagulation, HTN, HLD, chronic diastolic HF, CKD 3B, arthritis, T2DM and CAD who presents to the emergency department with a chief complaint of hypoxia and confusion.  The history was provided by EMS, the patient and the patient's daughter who is her healthcare POA.  They state that the patient was just discharged from her nursing facility within the past few days.  The patient's daughter states that the patient had run out of insurance coverage for the facility and they actually paid for an extra 3 days out-of-pocket to keep her in the facility while she was out of town.  She was just treated with antibiotics for pneumonia.    She is on Eliquis , has a history of atrial fibrillation.  She states that she developed acute onset shortness of breath overnight.  She has bilateral lower extremity swelling.  She endorses left-sided pressure in her chest.  She initially refused care stating I just want to go but subsequently acquiesced to an IV placement, EKG and labs in addition to chest x-ray.  She arrives with a MOST form, it is currently confirmed DNR limited.  Her daughter arrived at bedside and confirmed she would like everything short of CPR and intubation.  Okay with medications IV and initial workup.   Per EMS, the patient was notably hypoxic to 83% on room air and more slow to respond.  EMS administered 6 L O2 via nasal cannula and the patient became more responsive.  She refused an IV with EMS initially.  Her O2 saturations improved with 6 L O2 via nasal cannula.  She endorses mild nausea.  CXR: IMPRESSION:  1. Mild pulmonary edema and small  left pleural effusion.  2. Dense left retrocardiac opacity, likely atelectasis. Aspiration  or pneumonia can be considered in the appropriate clinical setting.   The patient was administered IV Lasix  20mg  given pulmonary edema, LE edema, and hypoxic respiratory failure.   Labs: CBC without a cytosis or anemia, CMP with mildly low bicarb at 20, anion gap normal, LFTs normal, creatinine 1.35 is at the patient's baseline, mildly low potassium at 3.4, replenished orally.  Discussed with family likely pulmonary edema from CHF exacerbation, hypoxic respiratory failure and patient is appearing confused has put out close to 2 L of urine in the emergency department, feeling symptomatically improved with no longer experiencing chest pain, blood pressure improved to 172/86.  Discussed with family admission for observation and continued diuresis which they were comfortable with.  Hospitalist medicine consulted for admission.     Final diagnoses:  Acute respiratory failure with hypoxia (HCC)  Acute pulmonary edema (HCC)  Acute on chronic congestive heart failure, unspecified heart failure type [facility])    ED Discharge Orders     None          Jerrol Agent, MD 11/18/23 [id number]

## 2023-11-18 NOTE — ED Triage Notes (Signed)
 Patient presents to the ER via EMS from home with altered LOC. Patient was reported to be at 83% on room air and slow to respond . EMS gave 6L nasal cannula and patient became more responsive. Patient c/o chest tightness and nausea. Patient refused IV per EMS. Patient is still refusing IV and labs in ER.

## 2023-11-19 DIAGNOSIS — I5033 Acute on chronic diastolic (congestive) heart failure: Secondary | ICD-10-CM | POA: Diagnosis not present

## 2023-11-19 DIAGNOSIS — Z7984 Long term (current) use of oral hypoglycemic drugs: Secondary | ICD-10-CM | POA: Diagnosis not present

## 2023-11-19 DIAGNOSIS — I10 Essential (primary) hypertension: Secondary | ICD-10-CM | POA: Diagnosis not present

## 2023-11-19 DIAGNOSIS — Z833 Family history of diabetes mellitus: Secondary | ICD-10-CM | POA: Diagnosis not present

## 2023-11-19 DIAGNOSIS — I480 Paroxysmal atrial fibrillation: Secondary | ICD-10-CM | POA: Diagnosis present

## 2023-11-19 DIAGNOSIS — Z8249 Family history of ischemic heart disease and other diseases of the circulatory system: Secondary | ICD-10-CM | POA: Diagnosis not present

## 2023-11-19 DIAGNOSIS — I130 Hypertensive heart and chronic kidney disease with heart failure and stage 1 through stage 4 chronic kidney disease, or unspecified chronic kidney disease: Secondary | ICD-10-CM | POA: Diagnosis present

## 2023-11-19 DIAGNOSIS — Z8701 Personal history of pneumonia (recurrent): Secondary | ICD-10-CM | POA: Diagnosis not present

## 2023-11-19 DIAGNOSIS — Z515 Encounter for palliative care: Secondary | ICD-10-CM | POA: Diagnosis not present

## 2023-11-19 DIAGNOSIS — J9601 Acute respiratory failure with hypoxia: Secondary | ICD-10-CM | POA: Diagnosis present

## 2023-11-19 DIAGNOSIS — E7849 Other hyperlipidemia: Secondary | ICD-10-CM | POA: Diagnosis present

## 2023-11-19 DIAGNOSIS — I2510 Atherosclerotic heart disease of native coronary artery without angina pectoris: Secondary | ICD-10-CM | POA: Diagnosis present

## 2023-11-19 DIAGNOSIS — R4182 Altered mental status, unspecified: Secondary | ICD-10-CM | POA: Diagnosis present

## 2023-11-19 DIAGNOSIS — Z7982 Long term (current) use of aspirin: Secondary | ICD-10-CM | POA: Diagnosis not present

## 2023-11-19 DIAGNOSIS — Z90710 Acquired absence of both cervix and uterus: Secondary | ICD-10-CM | POA: Diagnosis not present

## 2023-11-19 DIAGNOSIS — E876 Hypokalemia: Secondary | ICD-10-CM | POA: Diagnosis present

## 2023-11-19 DIAGNOSIS — Z96612 Presence of left artificial shoulder joint: Secondary | ICD-10-CM | POA: Diagnosis present

## 2023-11-19 DIAGNOSIS — I509 Heart failure, unspecified: Secondary | ICD-10-CM

## 2023-11-19 DIAGNOSIS — E1169 Type 2 diabetes mellitus with other specified complication: Secondary | ICD-10-CM | POA: Diagnosis present

## 2023-11-19 DIAGNOSIS — E118 Type 2 diabetes mellitus with unspecified complications: Secondary | ICD-10-CM | POA: Diagnosis not present

## 2023-11-19 DIAGNOSIS — Z882 Allergy status to sulfonamides status: Secondary | ICD-10-CM | POA: Diagnosis not present

## 2023-11-19 DIAGNOSIS — Z87891 Personal history of nicotine dependence: Secondary | ICD-10-CM | POA: Diagnosis not present

## 2023-11-19 DIAGNOSIS — I5043 Acute on chronic combined systolic (congestive) and diastolic (congestive) heart failure: Secondary | ICD-10-CM | POA: Diagnosis present

## 2023-11-19 DIAGNOSIS — D631 Anemia in chronic kidney disease: Secondary | ICD-10-CM | POA: Diagnosis present

## 2023-11-19 DIAGNOSIS — Z7189 Other specified counseling: Secondary | ICD-10-CM | POA: Diagnosis not present

## 2023-11-19 DIAGNOSIS — Z66 Do not resuscitate: Secondary | ICD-10-CM | POA: Diagnosis present

## 2023-11-19 DIAGNOSIS — E1122 Type 2 diabetes mellitus with diabetic chronic kidney disease: Secondary | ICD-10-CM | POA: Diagnosis present

## 2023-11-19 DIAGNOSIS — Z7901 Long term (current) use of anticoagulants: Secondary | ICD-10-CM | POA: Diagnosis not present

## 2023-11-19 DIAGNOSIS — J810 Acute pulmonary edema: Secondary | ICD-10-CM | POA: Diagnosis not present

## 2023-11-19 DIAGNOSIS — M1990 Unspecified osteoarthritis, unspecified site: Secondary | ICD-10-CM | POA: Diagnosis present

## 2023-11-19 DIAGNOSIS — N1832 Chronic kidney disease, stage 3b: Secondary | ICD-10-CM | POA: Diagnosis present

## 2023-11-19 LAB — COMPREHENSIVE METABOLIC PANEL WITH GFR
ALT: 14 U/L (ref 0–44)
AST: 18 U/L (ref 15–41)
Albumin: 3.1 g/dL — ABNORMAL LOW (ref 3.5–5.0)
Alkaline Phosphatase: 35 U/L — ABNORMAL LOW (ref 38–126)
Anion gap: 13 (ref 5–15)
BUN: 18 mg/dL (ref 8–23)
CO2: 23 mmol/L (ref 22–32)
Calcium: 8.4 mg/dL — ABNORMAL LOW (ref 8.9–10.3)
Chloride: 104 mmol/L (ref 98–111)
Creatinine, Ser: 1.35 mg/dL — ABNORMAL HIGH (ref 0.44–1.00)
GFR, Estimated: 36 mL/min — ABNORMAL LOW (ref >60)
Glucose, Bld: 156 mg/dL — ABNORMAL HIGH (ref 70–99)
Potassium: 3.2 mmol/L — ABNORMAL LOW (ref 3.5–5.1)
Sodium: 140 mmol/L (ref 135–145)
Total Bilirubin: 1.2 mg/dL (ref 0.0–1.2)
Total Protein: 6.2 g/dL — ABNORMAL LOW (ref 6.5–8.1)

## 2023-11-19 LAB — CBC
HCT: 33.2 % — ABNORMAL LOW (ref 36.0–46.0)
Hemoglobin: 11.2 g/dL — ABNORMAL LOW (ref 12.0–15.0)
MCH: 34.3 pg — ABNORMAL HIGH (ref 26.0–34.0)
MCHC: 33.7 g/dL (ref 30.0–36.0)
MCV: 101.5 fL — ABNORMAL HIGH (ref 80.0–100.0)
Platelets: 202 K/uL (ref 150–400)
RBC: 3.27 MIL/uL — ABNORMAL LOW (ref 3.87–5.11)
RDW: 13.4 % (ref 11.5–15.5)
WBC: 6.5 K/uL (ref 4.0–10.5)
nRBC: 0 % (ref 0.0–0.2)

## 2023-11-19 LAB — GLUCOSE, CAPILLARY
Glucose-Capillary: 196 mg/dL — ABNORMAL HIGH (ref 70–99)
Glucose-Capillary: 158 mg/dL — ABNORMAL HIGH (ref 70–99)
Glucose-Capillary: 212 mg/dL — ABNORMAL HIGH (ref 70–99)
Glucose-Capillary: 193 mg/dL — ABNORMAL HIGH (ref 70–99)

## 2023-11-19 MED ORDER — MAGNESIUM SULFATE 2 GM/50ML IV SOLN
2.0000 g | Freq: Once | INTRAVENOUS | Status: AC
  Administered 2023-11-19: 2 g via INTRAVENOUS
  Filled 2023-11-19: qty 50

## 2023-11-19 MED ORDER — CHLORHEXIDINE GLUCONATE CLOTH 2 % EX PADS
6.0000 | MEDICATED_PAD | Freq: Every day | CUTANEOUS | Status: DC
  Administered 2023-11-19 – 2023-11-20 (×2): 6 via TOPICAL

## 2023-11-19 MED ORDER — MORPHINE SULFATE (PF) 2 MG/ML IV SOLN
1.0000 mg | Freq: Once | INTRAVENOUS | Status: AC
  Administered 2023-11-19: 1 mg via INTRAVENOUS
  Filled 2023-11-19: qty 1

## 2023-11-19 MED ORDER — POTASSIUM CHLORIDE CRYS ER 20 MEQ PO TBCR
20.0000 meq | EXTENDED_RELEASE_TABLET | Freq: Once | ORAL | Status: AC
  Administered 2023-11-19: 20 meq via ORAL
  Filled 2023-11-19: qty 1

## 2023-11-19 MED ORDER — MORPHINE SULFATE (PF) 2 MG/ML IV SOLN
1.0000 mg | INTRAVENOUS | Status: DC | PRN
  Administered 2023-11-19 – 2023-11-20 (×3): 1 mg via INTRAVENOUS
  Filled 2023-11-19 (×3): qty 1

## 2023-11-19 MED ORDER — POTASSIUM CHLORIDE CRYS ER 20 MEQ PO TBCR: 40.0000 meq | EXTENDED_RELEASE_TABLET | Freq: Once | ORAL | Status: DC

## 2023-11-19 MED ORDER — SODIUM CHLORIDE 0.9 % IV SOLN
12.5000 mg | Freq: Four times a day (QID) | INTRAVENOUS | Status: DC | PRN
  Administered 2023-11-20 (×2): 12.5 mg via INTRAVENOUS
  Filled 2023-11-19: qty 12.5

## 2023-11-19 NOTE — Plan of Care (Signed)

## 2023-11-19 NOTE — Progress Notes (Signed)
 [facility]  PROGRESS NOTE  Allison Duffy FMW:[id number] DOB: 09/12/1926 DOA: 11/18/2023 PCP: Shepard Ade, MD   Brief HPI:    89 y.o. female with medical history significant of hypertension, hyperlipidemia, diabetes, CKD 3B, A-fib, CAD, chronic diastolic CHF, anemia presenting with shortness of breath.   Patient was recently discharged from SNF a few days ago after insurance payments ran out and family had paid for 3 additional days.  Reportedly recently treated for pneumonia.  Patient had increased shortness of breath overnight and has been having some lower extremity edema.  Also reports some associated nausea and had some chest pressure on presentation which has improved.   Initially when patient arrived she had stated that she did not want any interventions done, but after daughter arrived and they had further discussion that she has opted for further interventions but does remain DNR/DNI. Chest x-ray showed mild pulmonary edema with small left pleural effusion. Also noted was retrocardiac opacity suspicious for atelectasis versus aspiration versus pneumonia.    Assessment/Plan:    Acute hypoxemic respiratory failure -Secondary to acute on chronic diastolic CHF - Presented with worsening shortness of breath, found to have lower extremity edema - BNP 240, chest x-ray showed pulm edema with small left pleural effusion - Diuresed well with Lasix  in the ED - Last echocardiogram was last month with EF of 50-60%, G3 DD, normal RV function. -Started on Lasix  60 mg IV twice daily    Hypertension - Continue home amlodipine , clonidine  - Lasix  as above   Hyperlipidemia - Continue home rosuvastatin    Diabetes - SSI   CKD 3B > Creatinine stable at 1.35 - Trend renal function and electrolytes   Atrial fibrillation - Continue home Eliquis    CAD - Continue home ASA, rosuvastatin , Eliquis    Anemia > Normal in the ED - Trend CBC  Hypokalemia - Potassium is 3.2 -  Replace potassium and follow BMP in am  Goals of care - Discussed with patient, her daughter and granddaughter at bedside.  Patient is DNR.  Family wants to focus on comfort.  Will start morphine  1 mg IV every 4 hours as needed.  Palliative care has been consulted for clarification of goals of care.    DVT prophylaxis: Apixaban   Medications     amLODipine   10 mg Oral QHS   apixaban   2.5 mg Oral BID   aspirin  EC  81 mg Oral Daily   Chlorhexidine  Gluconate Cloth  6 each Topical Daily   cloNIDine   0.2 mg Oral q morning   And   cloNIDine   0.1 mg Oral QPM   furosemide   60 mg Intravenous BID   hydrALAZINE   37.5 mg Oral Q8H   insulin  aspart  0-15 Units Subcutaneous TID WC   isosorbide  dinitrate  20 mg Oral TID   pantoprazole   40 mg Oral Q supper   ranolazine   500 mg Oral BID   rosuvastatin   20 mg Oral QHS   sodium chloride  flush  3 mL Intravenous Q12H     Data Reviewed:   CBG:  Recent Labs  Lab 11/18/23 2118 11/19/23 0809 11/19/23 1136 11/19/23 1528  GLUCAP 145* 196* 158* 212*    SpO2: 98 % O2 Flow Rate (L/min): 4 L/min    Vitals:   11/19/23 0807 11/19/23 0830 11/19/23 1100 11/19/23 1526  BP: (!) 182/62  (!) 147/70 (!) 157/64  Pulse: 74 78  71  Resp: 20  19 16   Temp: 97.8 F (36.6 C)  98.5 F (36.9 C)  97.9 F (36.6 C)  TempSrc: Oral  Oral Oral  SpO2: 94%   98%  Weight:      Height:          Data Reviewed:  Basic Metabolic Panel: Recent Labs  Lab 11/18/23 1140 11/18/23 1149 11/18/23 1754 11/19/23 0407  NA 135 141  --  140  K 3.4* 3.6  --  3.2*  CL 105 106  --  104  CO2 20*  --   --  23  GLUCOSE 230* 245*  --  156*  BUN 22 23  --  18  CREATININE 1.35* 1.30*  --  1.35*  CALCIUM  8.5*  --   --  8.4*  MG  --   --  1.7  --     CBC: Recent Labs  Lab 11/18/23 1140 11/18/23 1149 11/19/23 0407  WBC 9.1  --  6.5  NEUTROABS 6.5  --   --   HGB 12.2 11.6* 11.2*  HCT 36.2 34.0* 33.2*  MCV 102.0*  --  101.5*  PLT 229  --  202    LFT Recent  Labs  Lab 11/18/23 1140 11/19/23 0407  AST 20 18  ALT 16 14  ALKPHOS 49 35*  BILITOT 1.1 1.2  PROT 7.2 6.2*  ALBUMIN  3.8 3.1*     Antibiotics: Anti-infectives (From admission, onward)    None        CONSULTS palliative care  Code Status: DNR  Family Communication: Discussed with patient's daughter and granddaughter at bedside     Subjective   Patient was tachypneic this morning.  Started on morphine  1 mg IV every 4 hours as needed   Objective    Physical Examination:   Appears in mild respiratory distress S1-S2, regular Lungs bibasilar crackles Abdomen is soft, nontender Extremities no edema   Status is: Inpatient:             [name]   [facility] If 7PM-7AM, please contact night-coverage at [email or web address], Office  [phone]   11/19/2023, 4:33 PM  LOS: 0 days

## 2023-11-19 NOTE — Care Management Obs Status (Signed)
 MEDICARE OBSERVATION STATUS NOTIFICATION   Patient Details  Name: Allison Duffy MRN: [id number] Date of Birth: 08/11/1926   Medicare Observation Status Notification Given:  Yes    Vonzell Arrie Sharps 11/19/2023, 9:48 AM

## 2023-11-20 DIAGNOSIS — J810 Acute pulmonary edema: Secondary | ICD-10-CM

## 2023-11-20 DIAGNOSIS — J9601 Acute respiratory failure with hypoxia: Secondary | ICD-10-CM | POA: Diagnosis not present

## 2023-11-20 DIAGNOSIS — Z515 Encounter for palliative care: Secondary | ICD-10-CM | POA: Diagnosis not present

## 2023-11-20 DIAGNOSIS — I509 Heart failure, unspecified: Secondary | ICD-10-CM | POA: Diagnosis not present

## 2023-11-20 DIAGNOSIS — Z7189 Other specified counseling: Secondary | ICD-10-CM | POA: Diagnosis not present

## 2023-11-20 DIAGNOSIS — Z66 Do not resuscitate: Secondary | ICD-10-CM

## 2023-11-20 LAB — BASIC METABOLIC PANEL WITH GFR
Anion gap: 13 (ref 5–15)
BUN: 21 mg/dL (ref 8–23)
CO2: 25 mmol/L (ref 22–32)
Calcium: 8.5 mg/dL — ABNORMAL LOW (ref 8.9–10.3)
Chloride: 101 mmol/L (ref 98–111)
Creatinine, Ser: 1.37 mg/dL — ABNORMAL HIGH (ref 0.44–1.00)
GFR, Estimated: 36 mL/min — ABNORMAL LOW (ref >60)
Glucose, Bld: 181 mg/dL — ABNORMAL HIGH (ref 70–99)
Potassium: 3.3 mmol/L — ABNORMAL LOW (ref 3.5–5.1)
Sodium: 139 mmol/L (ref 135–145)

## 2023-11-20 LAB — GLUCOSE, CAPILLARY
Glucose-Capillary: 253 mg/dL — ABNORMAL HIGH (ref 70–99)
Glucose-Capillary: 144 mg/dL — ABNORMAL HIGH (ref 70–99)

## 2023-11-20 MED ORDER — MORPHINE SULFATE (PF) 2 MG/ML IV SOLN
1.0000 mg | Freq: Once | INTRAVENOUS | Status: AC
  Administered 2023-11-20: 1 mg via INTRAVENOUS
  Filled 2023-11-20: qty 1

## 2023-11-20 MED ORDER — SENNA 8.6 MG PO TABS
2.0000 | ORAL_TABLET | Freq: Every day | ORAL | Status: DC
  Administered 2023-11-20: 17.2 mg via ORAL
  Filled 2023-11-20: qty 2

## 2023-11-20 MED ORDER — POTASSIUM CHLORIDE CRYS ER 20 MEQ PO TBCR
40.0000 meq | EXTENDED_RELEASE_TABLET | Freq: Once | ORAL | Status: AC
  Administered 2023-11-20: 40 meq via ORAL
  Filled 2023-11-20: qty 2

## 2023-11-20 MED ORDER — MORPHINE SULFATE (PF) 2 MG/ML IV SOLN
1.0000 mg | INTRAVENOUS | Status: DC | PRN
  Administered 2023-11-20 – 2023-11-22 (×4): 1 mg via INTRAVENOUS
  Filled 2023-11-20 (×4): qty 1

## 2023-11-20 MED ORDER — FUROSEMIDE 10 MG/ML IJ SOLN: 20.0000 mg | Freq: Two times a day (BID) | INTRAMUSCULAR | Status: DC

## 2023-11-20 MED ORDER — BIOTENE DRY MOUTH MT LIQD
15.0000 mL | Freq: Three times a day (TID) | OROMUCOSAL | Status: DC
  Filled 2023-11-20: qty 15

## 2023-11-20 MED ORDER — HYDROMORPHONE BOLUS VIA INFUSION
0.2500 mg | INTRAVENOUS | Status: DC | PRN
  Administered 2023-11-20 – 2023-11-21 (×4): 0.25 mg via INTRAVENOUS
  Administered 2023-11-22: 0.5 mg via INTRAVENOUS
  Administered 2023-11-22 (×3): 0.25 mg via INTRAVENOUS

## 2023-11-20 MED ORDER — HYDROMORPHONE HCL-NACL 50-0.9 MG/50ML-% IV SOLN
0.2500 mg/h | INTRAVENOUS | Status: DC
  Administered 2023-11-20: 0.25 mg/h via INTRAVENOUS
  Administered 2023-11-21 – 2023-11-22 (×2): 0.5 mg/h via INTRAVENOUS
  Filled 2023-11-20 (×3): qty 50

## 2023-11-20 MED ORDER — LORAZEPAM 2 MG/ML IJ SOLN
0.5000 mg | INTRAMUSCULAR | Status: DC | PRN
  Administered 2023-11-22: 1 mg via INTRAVENOUS
  Filled 2023-11-20: qty 1

## 2023-11-20 MED ORDER — POLYVINYL ALCOHOL 1.4 % OP SOLN
1.0000 [drp] | Freq: Four times a day (QID) | OPHTHALMIC | Status: DC | PRN
  Administered 2023-11-20: 1 [drp] via OPHTHALMIC
  Filled 2023-11-20: qty 15

## 2023-11-20 MED ORDER — BISACODYL 10 MG RE SUPP
10.0000 mg | Freq: Once | RECTAL | Status: AC
  Administered 2023-11-20: 10 mg via RECTAL
  Filled 2023-11-20: qty 1

## 2023-11-20 NOTE — Plan of Care (Signed)
  Problem: Education: Goal: Ability to describe self-care measures that may prevent or decrease complications (Diabetes Survival Skills Education) will improve Outcome: Progressing Goal: Individualized Educational Video(s) Outcome: Progressing   Problem: Coping: Goal: Ability to adjust to condition or change in health will improve Outcome: Progressing   Problem: Fluid Volume: Goal: Ability to maintain a balanced intake and output will improve Outcome: Progressing   Problem: Health Behavior/Discharge Planning: Goal: Ability to identify and utilize available resources and services will improve Outcome: Progressing Goal: Ability to manage health-related needs will improve Outcome: Progressing   Problem: Metabolic: Goal: Ability to maintain appropriate glucose levels will improve Outcome: Progressing   Problem: Nutritional: Goal: Maintenance of adequate nutrition will improve Outcome: Progressing Goal: Progress toward achieving an optimal weight will improve Outcome: Progressing   Problem: Skin Integrity: Goal: Risk for impaired skin integrity will decrease Outcome: Progressing   Problem: Tissue Perfusion: Goal: Adequacy of tissue perfusion will improve Outcome: Progressing   Problem: Education: Goal: Knowledge of General Education information will improve Description: Including pain rating scale, medication(s)/side effects and non-pharmacologic comfort measures Outcome: Progressing   Problem: Health Behavior/Discharge Planning: Goal: Ability to manage health-related needs will improve Outcome: Progressing   Problem: Clinical Measurements: Goal: Ability to maintain clinical measurements within normal limits will improve Outcome: Progressing Goal: Will remain free from infection Outcome: Progressing Goal: Diagnostic test results will improve Outcome: Progressing Goal: Respiratory complications will improve Outcome: Progressing Goal: Cardiovascular complication will  be avoided Outcome: Progressing   Problem: Activity: Goal: Risk for activity intolerance will decrease Outcome: Progressing   Problem: Nutrition: Goal: Adequate nutrition will be maintained Outcome: Progressing   Problem: Coping: Goal: Level of anxiety will decrease Outcome: Progressing   Problem: Elimination: Goal: Will not experience complications related to bowel motility Outcome: Progressing Goal: Will not experience complications related to urinary retention Outcome: Progressing   Problem: Pain Managment: Goal: General experience of comfort will improve and/or be controlled Outcome: Progressing   Problem: Safety: Goal: Ability to remain free from injury will improve Outcome: Progressing   Problem: Skin Integrity: Goal: Risk for impaired skin integrity will decrease Outcome: Progressing   Problem: Education: Goal: Ability to demonstrate management of disease process will improve Outcome: Progressing Goal: Ability to verbalize understanding of medication therapies will improve Outcome: Progressing Goal: Individualized Educational Video(s) Outcome: Progressing   Problem: Activity: Goal: Capacity to carry out activities will improve Outcome: Progressing   Problem: Cardiac: Goal: Ability to achieve and maintain adequate cardiopulmonary perfusion will improve Outcome: Progressing   Problem: Education: Goal: Knowledge of the prescribed therapeutic regimen will improve Outcome: Progressing   Problem: Coping: Goal: Ability to identify and develop effective coping behavior will improve Outcome: Progressing   Problem: Clinical Measurements: Goal: Quality of life will improve Outcome: Progressing   Problem: Respiratory: Goal: Verbalizations of increased ease of respirations will increase Outcome: Progressing   Problem: Role Relationship: Goal: Family's ability to cope with current situation will improve Outcome: Progressing Goal: Ability to verbalize  concerns, feelings, and thoughts to partner or family member will improve Outcome: Progressing   Problem: Pain Management: Goal: Satisfaction with pain management regimen will improve Outcome: Progressing

## 2023-11-20 NOTE — TOC Initial Note (Signed)
 Transition of Care [facility]) - Initial/Assessment Note    Patient Details  Name: Allison Duffy MRN: [id number] Date of Birth: 30-Apr-1926  Transition of Care [facility]) CM/SW Contact:    Allison Duffy, LCSWA Phone Number: 11/20/2023, 2:58 PM  Clinical Narrative:                    CSW received consult from [facility] NP with palliative for residential hospice placement for patient. CSW spoke with patients daughter Allison Duffy who is agreeable for CSW to make a referral to [facility] for Hospice home in [facility] for patient. CSW made referral to Allison Duffy with [facility]. CSW will continue to follow.     Patient Goals and CMS Choice            Expected Discharge Plan and Services                                              Prior Living Arrangements/Services                       Activities of Daily Living   ADL Screening (condition at time of admission) Independently performs ADLs?: Yes (appropriate for developmental age) Is the patient deaf or have difficulty hearing?: No Does the patient have difficulty seeing, even when wearing glasses/contacts?: No Does the patient have difficulty concentrating, remembering, or making decisions?: No  Permission Sought/Granted                  Emotional Assessment              Admission diagnosis:  Acute pulmonary edema (HCC) [J81.0] Acute respiratory failure with hypoxia (HCC) [J96.01] Acute on chronic congestive heart failure, unspecified heart failure type (HCC) [I50.9] Patient Active Problem List   Diagnosis Date Noted   Anemia 11/18/2023   Acute respiratory failure with hypoxia (HCC) 11/18/2023   Acute on chronic diastolic (congestive) heart failure (HCC) 11/18/2023   VBI (vertebrobasilar insufficiency) 10/16/2023   Dizziness 10/16/2023   CKD stage 3b, GFR 30-44 ml/min (HCC) 10/16/2023   Intractable pain 03/23/2023   Atypical chest pain 07/24/2022   Diarrhea 03/29/2021    Hypokalemia 03/29/2021   Coronary artery disease 04/13/2020   Unstable angina (HCC) 04/11/2020   Mitral regurgitation 03/01/2019   Chronic diastolic HF (heart failure) (HCC) 01/28/2019   Paroxysmal atrial fibrillation (HCC) 01/28/2019   Primary osteoarthritis of right shoulder 12/27/2014   Pulmonary nodule 06/23/2014   Essential hypertension 07/05/2013   Hyperlipidemia associated with type 2 diabetes mellitus (HCC) 07/05/2013   Type 2 diabetes mellitus with complication, without long-term current use of insulin  (HCC) 07/05/2013   Chest pain CAD 07/05/2013   PCP:  Allison Ade, MD Pharmacy:   [facility] [phone] [place], [place] - [address] AT [facility] [address] [place] [address] Phone: [phone] Fax: [phone]  [facility] [address] [place] [address] Phone: [phone] Fax: [phone]  [id number] [place], [place] - [address] AT [facility] [address] [facility] [address] [place] [address] Phone: [phone] Fax: [phone]     Social Drivers of Health (SDOH) Social History: SDOH Screenings   Food Insecurity: No Food Insecurity (11/18/2023)  Housing: High Risk (11/18/2023)  Transportation Needs: No Transportation Needs (  11/18/2023)  Utilities: Not At Risk (11/18/2023)  Depression (PHQ2-9): Low Risk  (04/30/2021)  Social Connections: Socially Isolated (11/18/2023)  Tobacco Use: Medium Risk (11/18/2023)   SDOH Interventions:     Readmission Risk Interventions    03/25/2023   10:24 AM 07/30/2022    2:17 PM  Readmission Risk Prevention Plan  Transportation Screening Complete Complete  PCP or Specialist Appt within 3-5 Days Complete Complete  HRI or Home Care Consult Complete   Social Work Consult for Recovery Care Planning/Counseling Complete Complete  Palliative Care Screening Complete Complete  Medication Review Oceanographer) Complete Complete

## 2023-11-20 NOTE — Progress Notes (Signed)
 Heart Failure Navigator Progress Note  Assessed for Heart & Vascular TOC clinic readiness.  Patient does not meet criteria due to patient wishes to focus on comfort. Palliative care consulted for goals of care.   Navigator available for reassessment of patient.   Duwaine Plant, PharmD, BCPS Heart Failure Stewardship Pharmacist Phone [phone]

## 2023-11-20 NOTE — Progress Notes (Signed)
 [facility]  PROGRESS NOTE  Allison Duffy FMW:[id number] DOB: 12-Feb-1926 DOA: 11/18/2023 PCP: Allison Ade, MD   Brief HPI:    89 y.o. female with medical history significant of hypertension, hyperlipidemia, diabetes, CKD 3B, A-fib, CAD, chronic diastolic CHF, anemia presenting with shortness of breath.   Patient was recently discharged from SNF a few days ago after insurance payments ran out and family had paid for 3 additional days.  Reportedly recently treated for pneumonia.  Patient had increased shortness of breath overnight and has been having some lower extremity edema.  Also reports some associated nausea and had some chest pressure on presentation which has improved.   Initially when patient arrived she had stated that she did not want any interventions done, but after daughter arrived and they had further discussion that she has opted for further interventions but does remain DNR/DNI. Chest x-ray showed mild pulmonary edema with small left pleural effusion. Also noted was retrocardiac opacity suspicious for atelectasis versus aspiration versus pneumonia.    Assessment/Plan:    Acute hypoxemic respiratory failure -Secondary to acute on chronic diastolic CHF - Presented with worsening shortness of breath, found to have lower extremity edema - BNP 240, chest x-ray showed pulm edema with small left pleural effusion - Diuresed well with Lasix  in the ED - Last echocardiogram was last month with EF of 50-60%, G3 DD, normal RV function. -Started on Lasix  60 mg IV twice daily    Hypertension - Continue home amlodipine , clonidine  - Lasix  as above   Hyperlipidemia - Continue home rosuvastatin    Diabetes - SSI   CKD 3B > Creatinine stable at 1.35 - Trend renal function and electrolytes   Atrial fibrillation - Continue home Eliquis    CAD - Continue home ASA, rosuvastatin , Eliquis    Anemia > Normal in the ED - Trend CBC  Hypokalemia - Potassium is 3.2 -  Replace potassium and follow BMP in am  Goals of care - Discussed with patient, her daughter and granddaughter at bedside.  Patient is DNR.  Family wants to focus on comfort.  Will change morphine  to 1 mg IV every hour as needed.  Will give 1 dose of potassium.  Awaiting palliative care discussion for goals of care.  Likely will go to residential hospice versus home with hospice.      DVT prophylaxis: Apixaban   Medications     amLODipine   10 mg Oral QHS   apixaban   2.5 mg Oral BID   aspirin  EC  81 mg Oral Daily   Chlorhexidine  Gluconate Cloth  6 each Topical Daily   cloNIDine   0.2 mg Oral q morning   And   cloNIDine   0.1 mg Oral QPM   furosemide   60 mg Intravenous BID   hydrALAZINE   37.5 mg Oral Q8H   insulin  aspart  0-15 Units Subcutaneous TID WC   isosorbide  dinitrate  20 mg Oral TID    morphine  injection  1 mg Intravenous Once   pantoprazole   40 mg Oral Q supper   ranolazine   500 mg Oral BID   rosuvastatin   20 mg Oral QHS   sodium chloride  flush  3 mL Intravenous Q12H     Data Reviewed:   CBG:  Recent Labs  Lab 11/19/23 0809 11/19/23 1136 11/19/23 1528 11/19/23 2153 11/20/23 0808  GLUCAP 196* 158* 212* 193* 253*    SpO2: 94 % O2 Flow Rate (L/min): 5 L/min    Vitals:   11/19/23 2120 11/20/23 0313 11/20/23 0500 11/20/23 0803  BP: ROLLEN)  149/66 (!) 145/61  (!) 161/66  Pulse: 74 70    Resp:  18  19  Temp:  97.6 F (36.4 C)  98.3 F (36.8 C)  TempSrc:  Oral  Oral  SpO2: 95% 94%    Weight:   66 kg   Height:          Data Reviewed:  Basic Metabolic Panel: Recent Labs  Lab 11/18/23 1140 11/18/23 1149 11/18/23 1754 11/19/23 0407 11/20/23 0356  NA 135 141  --  140 139  K 3.4* 3.6  --  3.2* 3.3*  CL 105 106  --  104 101  CO2 20*  --   --  23 25  GLUCOSE 230* 245*  --  156* 181*  BUN 22 23  --  18 21  CREATININE 1.35* 1.30*  --  1.35* 1.37*  CALCIUM  8.5*  --   --  8.4* 8.5*  MG  --   --  1.7  --   --     CBC: Recent Labs  Lab 11/18/23 1140  11/18/23 1149 11/19/23 0407  WBC 9.1  --  6.5  NEUTROABS 6.5  --   --   HGB 12.2 11.6* 11.2*  HCT 36.2 34.0* 33.2*  MCV 102.0*  --  101.5*  PLT 229  --  202    LFT Recent Labs  Lab 11/18/23 1140 11/19/23 0407  AST 20 18  ALT 16 14  ALKPHOS 49 35*  BILITOT 1.1 1.2  PROT 7.2 6.2*  ALBUMIN  3.8 3.1*     Antibiotics: Anti-infectives (From admission, onward)    None        CONSULTS palliative care  Code Status: DNR  Family Communication: Discussed with patient's daughter and granddaughter at bedside     Subjective   Patient seen and examined, complains of shortness of breath.  Has been getting high-dose Lasix  with good diuresis.  She was started on morphine  1 mg IV every 4 hours..  Wants morphine  more frequently.   Objective    Physical Examination:  Appears in mild respiratory distress S1-S2, regular, no murmur auscultated Abdomen is soft, nontender, no organomegaly Extremities no edema  Status is: Inpatient:             [name]   [facility] If 7PM-7AM, please contact night-coverage at [email or web address], Office  [phone]   11/20/2023, 9:10 AM  LOS: 1 day

## 2023-11-20 NOTE — Consult Note (Signed)
 Consultation Note Date: 11/20/2023   Patient Name: Allison Duffy  DOB: [birth year]  MRN: [id number]  Age / Sex: 89 y.o., female  PCP: Shepard Ade, MD Referring Physician: Drusilla Sabas RAMAN, MD  Reason for Consultation: Establishing goals of care  HPI/Patient Profile: 89 y.o. female  with past medical history of hypertension, hyperlipidemia, diabetes, CKD 3B, A-fib, CAD, chronic diastolic CHF, and anemia admitted on 11/18/2023 with shortness of breath.  She is being treated for acute respiratory failure secondary to acute on chronic diastolic CHF.  PMT consulted to discuss goals of care.  Clinical Assessment and Goals of Care: I have reviewed medical records including EPIC notes, labs and imaging, received report from RN, assessed the patient and then met with patient, her daughter, and her granddaughter to discuss diagnosis prognosis, GOC, EOL wishes, disposition and options.  I introduced Palliative Medicine as specialized medical care for people living with serious illness. It focuses on providing relief from the symptoms and stress of a serious illness. The goal is to improve quality of life for both the patient and the family.  As soon as conversation is started patient immediately expresses her readiness to die.  She tells me she does not want anything else done to prolong her life.  She tells me she does not feel well and she is old and she is ready to go whenever it is her time.  Family reviews with me her recent course with hospital stay and then to SNF and then home -did not do well at home with respiratory status.  They share she has been very sharp mentally even during this hospitalization.   We discussed patient's current illness and what it means in the larger context of patient's on-going co-morbidities.  Natural disease trajectory and expectations at EOL were discussed.  Patient and family are all clear and their goals to focus  on her comfort and not proceed with any measures that we will prolong her life.  We reviewed the option of a hospice facility to help care for her and keep her comfortable.  They are all interested in this option.  Would like hospice facility in [facility].  We reviewed transitioning current care here in the hospital to the care she would receive at the hospice facility -slowly measures used to keep her comfortable.  Reviewed all the medications and reviewed stopping the majority of them -patient and family agree to this as well.  Family shared they are concerned that patient's shortness of breath is not being adequately treated as she continues to complain of heavy chest pressure and appears quite short of breath.  They share that patient does not want to feel like a burden and so often does not ask for medication even when she needs it.  They ask how we can ensure she gets medication she needs.  We discussed the option of scheduling medications or starting a low-dose infusion of medications to keep her comfortable.  Family likes the option of infusion to keep a steady dose of medication in her system and try to keep her more comfortable.  They share historically she has not tolerated morphine  well but has tolerated Dilaudid  in the past.  We discussed plan to transition her to a low-dose Dilaudid  infusion.  We discussed that Dilaudid  boluses will be available along with Ativan  pushes if needed.  Will also add Senokot.  Patient just received suppository.  Questions and concerns were addressed. The family was encouraged to call with questions or concerns.  Primary Decision Maker PATIENT Joined by next of kin-daughter  SUMMARY OF RECOMMENDATIONS   -Transition to comfort measures only - DC all life-prolonging measures -to include insulin  and Lasix  -patient only wants medications to keep her comfortable at this point - Initiate low-dose Dilaudid  infusion with as needed boluses - As needed Ativan  -  Refer to [facility] for inpatient facility placement - signed DNR has been placed on chart  Code Status/Advance Care Planning: DNR     Primary Diagnoses: Present on Admission:  Type 2 diabetes mellitus with complication, without long-term current use of insulin  (HCC)  Paroxysmal atrial fibrillation (HCC)  Hyperlipidemia associated with type 2 diabetes mellitus (HCC)  Essential hypertension  Coronary artery disease  CKD stage 3b, GFR 30-44 ml/min (HCC)  Chronic diastolic HF (heart failure) (HCC)  Anemia  Acute respiratory failure with hypoxia (HCC)   I have reviewed the medical record, interviewed the patient and family, and examined the patient. The following aspects are pertinent.  Past Medical History:  Diagnosis Date   Acute on chronic combined systolic and diastolic CHF (congestive heart failure) (HCC) 01/28/2019   Acute respiratory failure with hypoxia (HCC) 03/29/2021   Acute respiratory failure with hypoxia secondary to community acquired pneumonia 03/29/2021   Arthritis    scattered joints (07/29/2013)   Chest pain    Colitis    Colitis, acute 05/31/2016   Gout attack ?1980's   Hyperlipidemia    Hypertension    Lactic acidosis 03/29/2021   PONV (postoperative nausea and vomiting)    Type II diabetes mellitus (HCC)    Social History   Socioeconomic History   Marital status: Widowed    Spouse name: 4 children and 3 living   Number of children: 4   Years of education: Not on file   Highest education level: Not on file  Occupational History   Occupation: Retired  Tobacco Use   Smoking status: Former    Current packs/day: 0.00    Types: Cigarettes    Start date: 07/05/1948    Quit date: 07/05/1953    Years since quitting: 70.4   Smokeless tobacco: Never  Vaping Use   Vaping status: Never Used  Substance and Sexual Activity   Alcohol  use: No   Drug use: No   Sexual activity: Not Currently  Other Topics Concern   Not on file  Social History  Narrative   Lives independently in her own home. Daughter, Jori, is 5 min. away and her grandson lives next door.   Social Drivers of Corporate investment banker Strain: Not on file  Food Insecurity: No Food Insecurity (11/18/2023)   Hunger Vital Sign    Worried About Running Out of Food in the Last Year: Never true    Ran Out of Food in the Last Year: Never true  Transportation Needs: No Transportation Needs (11/18/2023)   PRAPARE - Administrator, Civil Service (Medical): No    Lack of Transportation (Non-Medical): No  Physical Activity: Not on file  Stress: Not on file  Social Connections: Socially Isolated (11/18/2023)   Social Connection and Isolation Panel    Frequency of Communication with Friends and Family: More than three times a week    Frequency of Social Gatherings with Friends and Family: More than three times a week    Attends Religious Services: Never    Database administrator or Organizations: No    Attends Banker Meetings: Never    Marital Status:  Widowed   Family History  Problem Relation Age of Onset   Hypertension Father    Heart disease Father    Heart disease Mother    Diabetes Mother    Hypertension Mother    Heart disease Brother        both brothers   Diabetes Brother    Hypertension Brother    Scheduled Meds:  antiseptic oral rinse  15 mL Topical TID   sodium chloride  flush  3 mL Intravenous Q12H   Continuous Infusions:  HYDROmorphone      promethazine  (PHENERGAN ) injection (IM or IVPB) 12.5 mg (11/20/23 1040)   PRN Meds:.acetaminophen  **OR** acetaminophen , artificial tears, HYDROmorphone , LORazepam , morphine  injection, polyethylene glycol, promethazine  (PHENERGAN ) injection (IM or IVPB) Allergies  Allergen Reactions   Codeine Nausea And Vomiting   Hydrocodone  Nausea And Vomiting    Nausea and vomiting   Morphine  Sulfate Nausea And Vomiting   Prednisone      Other reaction(s): sick on her stomach   Sulfa  Antibiotics Itching, Nausea Only and Other (See Comments)   Review of Systems  Respiratory:  Positive for shortness of breath.   Cardiovascular:  Positive for chest pain.  Neurological:  Positive for weakness.    Physical Exam Constitutional:      General: She is not in acute distress.    Appearance: She is ill-appearing.  Pulmonary:     Comments: Breathing appears slightly labored Skin:    General: Skin is warm and dry.  Neurological:     Mental Status: She is alert and oriented to person, place, and time.  Psychiatric:        Mood and Affect: Mood normal.        Behavior: Behavior normal.     Vital Signs: BP (!) 155/55   Pulse 70   Temp 98.4 F (36.9 C) (Oral)   Resp 17   Ht 5' 6 (1.676 m)   Wt 66 kg   SpO2 94%   BMI 23.48 kg/m  Pain Scale: 0-10   Pain Score: 8    SpO2: SpO2: 94 % O2 Device:SpO2: 94 % O2 Flow Rate: .O2 Flow Rate (L/min): 5 L/min  IO: Intake/output summary:  Intake/Output Summary (Last 24 hours) at 11/20/2023 1454 Last data filed at 11/20/2023 1237 Gross per 24 hour  Intake 50 ml  Output 2850 ml  Net -2800 ml    LBM: Last BM Date : 11/17/23 Baseline Weight: Weight: 63.5 kg Most recent weight: Weight: 66 kg     Palliative Assessment/Data: PPS 30%     *Please note that this is a verbal dictation therefore any spelling or grammatical errors are due to the Dragon Medical One system interpretation.   Time Total: 80 minutes Time spent includes: Detailed review of medical records (labs, imaging, vital signs), medically appropriate exam, discussion with treatment team, counseling and educating patient, family and/or staff, documenting clinical information, medication management and coordination of care.    Tobey Jama Barnacle, DNP, AGNP-C Palliative Medicine Team [phone] Pager: [phone]

## 2023-11-21 DIAGNOSIS — Z515 Encounter for palliative care: Secondary | ICD-10-CM | POA: Diagnosis not present

## 2023-11-21 DIAGNOSIS — J9601 Acute respiratory failure with hypoxia: Secondary | ICD-10-CM | POA: Diagnosis not present

## 2023-11-21 DIAGNOSIS — I5033 Acute on chronic diastolic (congestive) heart failure: Secondary | ICD-10-CM | POA: Diagnosis not present

## 2023-11-21 DIAGNOSIS — Z7189 Other specified counseling: Secondary | ICD-10-CM

## 2023-11-21 DIAGNOSIS — I509 Heart failure, unspecified: Secondary | ICD-10-CM | POA: Diagnosis not present

## 2023-11-21 DIAGNOSIS — J810 Acute pulmonary edema: Secondary | ICD-10-CM | POA: Diagnosis not present

## 2023-11-21 MED ORDER — DIPHENHYDRAMINE HCL 50 MG/ML IJ SOLN
25.0000 mg | Freq: Four times a day (QID) | INTRAMUSCULAR | Status: DC | PRN
  Administered 2023-11-21: 25 mg via INTRAVENOUS
  Filled 2023-11-21: qty 1

## 2023-11-21 NOTE — Progress Notes (Addendum)
 Palliative Medicine Inpatient Follow Up Note   HPI: 89 y.o. female  with past medical history of hypertension, hyperlipidemia, diabetes, CKD 3B, A-fib, CAD, chronic diastolic CHF, and anemia admitted on 11/18/2023 with shortness of breath.  She is being treated for acute respiratory failure secondary to acute on chronic diastolic CHF.  PMT consulted to discuss goals of care    Today's Discussion 11/21/2023  *Please note that this is a verbal dictation therefore any spelling or grammatical errors are due to the Dragon Medical One system interpretation.  Chart reviewed inclusive of vital signs, progress notes, laboratory results, and diagnostic images.   I visited the patient at her bedside, where several family members were present, including her daughter, Jori. The patient was resting but easily arousable upon gentle stimulation. She was alert, oriented, and able to engage in brief conversation with me. During our interaction, she expressed feeling comfortable, free of pain, and without any distress. Clinically, she did not exhibit signs or symptoms of respiratory compromise. She remains on supplemental oxygen  at 3.5 liters per minute via nasal cannula.  The patient's goals of care remain unchanged. She is designated as Do Not Resuscitate (DNR) with a focus on comfort measures. All appropriate medications to manage distressing symptoms are in place. A continuous low-dose Dilaudid  infusion is currently running, with additional doses available as needed for symptom control. Plans are in place for her transfer to the [facility] inpatient facility once a bed becomes available.  Family members at the bedside shared their anticipation of additional loved ones arriving from in and out of state to visit. They expressed a preference for the patient to remain as alert as possible for now to allow meaningful interaction during these visits. The family appears to be at peace with the  decision to honor the patient's wishes for full comfort-focused care, recognizing the importance of preserving her quality of life during this final stage.  Questions and concerns addressed   Palliative Support Provided.   Objective Assessment: Vital Signs Vitals:   11/20/23 1616 11/20/23 2030  BP: (!) 159/59 (!) 152/117  Pulse:    Resp: 18 20  Temp: 98.6 F (37 C) 99.2 F (37.3 C)  SpO2:  96%    Intake/Output Summary (Last 24 hours) at 11/21/2023 0850 Last data filed at 11/21/2023 0600 Gross per 24 hour  Intake 296.69 ml  Output 1800 ml  Net -1503.31 ml   Last Weight  Most recent update: 11/20/2023  6:12 AM    Weight  66 kg (145 lb 8.1 oz)             Review of Systems  Respiratory:  Positive for shortness of breath.   Cardiovascular:  Positive for chest pain.  Neurological:  Positive for weakness.   Physical Exam Constitutional:      General: She is not in acute distress.    Appearance: She is ill-appearing.  Pulmonary:     Comments: Breathing appears slightly labored Skin:    General: Skin is warm and dry.  Neurological:     Mental Status: She is alert and oriented to person, place, and time.  Psychiatric:        Mood and Affect: Mood normal.        Behavior: Behavior normal.   SUMMARY OF RECOMMENDATIONS   Code Status: DNR-Comfort Maintain comfort measures only DC all life-prolonging measures -to include insulin  and Lasix  -patient only wants medications to keep her comfortable at this point Initiate low-dose Dilaudid   infusion with as needed boluses As needed Ativan  If stable, patient is to be transferred to inpatient facility ad [facility] once bed becomes available.   Time Spent: 35 minutes   Detailed review of medical records (labs, imaging, vital signs), medically appropriate exam, discussed with treatment team, counseling and education to patient, family, & staff, documenting clinical information, coordination of care.    ________________________________________________________________________ Kathlyne Bolder NP-C [place] Palliative Medicine Team Team Cell Phone: [phone] Please utilize secure chat with additional questions, if there is no response within 30 minutes please call the above phone number  Palliative Medicine Team providers are available by phone from 7am to 7pm daily and can be reached through the team cell phone.  Should this patient require assistance outside of these hours, please call the patient's attending physician.

## 2023-11-21 NOTE — Progress Notes (Signed)
 [facility]  PROGRESS NOTE  Allison Duffy FMW:[id number] DOB: [birth year] DOA: 11/18/2023 PCP: Shepard Ade, MD   Brief HPI:    89 y.o. female with medical history significant of hypertension, hyperlipidemia, diabetes, CKD 3B, A-fib, CAD, chronic diastolic CHF, anemia presenting with shortness of breath.   Patient was recently discharged from SNF a few days ago after insurance payments ran out and family had paid for 3 additional days.  Reportedly recently treated for pneumonia.  Patient had increased shortness of breath overnight and has been having some lower extremity edema.  Also reports some associated nausea and had some chest pressure on presentation which has improved.   Initially when patient arrived she had stated that she did not want any interventions done, but after daughter arrived and they had further discussion that she has opted for further interventions but does remain DNR/DNI. Chest x-ray showed mild pulmonary edema with small left pleural effusion. Also noted was retrocardiac opacity suspicious for atelectasis versus aspiration versus pneumonia.    Assessment/Plan:    Acute hypoxemic respiratory failure -Secondary to acute on chronic diastolic CHF - Presented with worsening shortness of breath, found to have lower extremity edema - BNP 240, chest x-ray showed pulm edema with small left pleural effusion - Diuresed well with Lasix  in the ED - Last echocardiogram was last month with EF of 50-60%, G3 DD, normal RV function. -Started on Lasix  60 mg IV twice daily - Lasix  has been discontinued, patient on Dilaudid  gtt. for comfort    CKD 3B > Creatinine stable at 1.35 - T   Atrial fibrillation - Eliquis  has been discontinued   CAD - Home meds discontinued as patient is on comfort measures only   Goals of care - Discussed with patient, her daughter and granddaughter at bedside.  Patient is DNR.  Family wants to focus on comfort.  Started on Dilaudid   infusion .palliative care following.  Plan to go to residential hospice once bed becomes available.      DVT prophylaxis: Apixaban   Medications     antiseptic oral rinse  15 mL Topical TID   senna  2 tablet Oral QHS   sodium chloride  flush  3 mL Intravenous Q12H     Data Reviewed:   CBG:  Recent Labs  Lab 11/19/23 1136 11/19/23 1528 11/19/23 2153 11/20/23 0808 11/20/23 1231  GLUCAP 158* 212* 193* 253* 144*    SpO2: 96 % O2 Flow Rate (L/min): 3.5 L/min    Vitals:   11/20/23 1234 11/20/23 1414 11/20/23 1616 11/20/23 2030  BP:  (!) 155/55 (!) 159/59 (!) 152/117  Pulse:      Resp: 17  18 20   Temp: 98.4 F (36.9 C)  98.6 F (37 C) 99.2 F (37.3 C)  TempSrc: Oral  Oral Oral  SpO2:    96%  Weight:      Height:          Data Reviewed:  Basic Metabolic Panel: Recent Labs  Lab 11/18/23 1140 11/18/23 1149 11/18/23 1754 11/19/23 0407 11/20/23 0356  NA 135 141  --  140 139  K 3.4* 3.6  --  3.2* 3.3*  CL 105 106  --  104 101  CO2 20*  --   --  23 25  GLUCOSE 230* 245*  --  156* 181*  BUN 22 23  --  18 21  CREATININE 1.35* 1.30*  --  1.35* 1.37*  CALCIUM  8.5*  --   --  8.4* 8.5*  MG  --   --  1.7  --   --     CBC: Recent Labs  Lab 11/18/23 1140 11/18/23 1149 11/19/23 0407  WBC 9.1  --  6.5  NEUTROABS 6.5  --   --   HGB 12.2 11.6* 11.2*  HCT 36.2 34.0* 33.2*  MCV 102.0*  --  101.5*  PLT 229  --  202    LFT Recent Labs  Lab 11/18/23 1140 11/19/23 0407  AST 20 18  ALT 16 14  ALKPHOS 49 35*  BILITOT 1.1 1.2  PROT 7.2 6.2*  ALBUMIN  3.8 3.1*     Antibiotics: Anti-infectives (From admission, onward)    None        CONSULTS palliative care  Code Status: DNR  Family Communication: Discussed with patient's daughter and granddaughter at bedside     Subjective   Patient seen and examined, resting comfortably.  Started on IV Dilaudid  infusion   Objective    Physical Examination:  Appears lethargic Opens eyes to verbal  stimuli Lungs-decreased breath sounds bilaterally S1-S2, regular  Status is: Inpatient:             Allison Duffy Brod   [facility] If 7PM-7AM, please contact night-coverage at [email or web address], Office  [phone]   11/21/2023, 5:50 PM  LOS: 2 days

## 2023-11-21 NOTE — TOC Progression Note (Signed)
 Transition of Care [facility]) - Progression Note    Patient Details  Name: Allison Duffy MRN: [id number] Date of Birth: 12/31/1926  Transition of Care [facility]) CM/SW Contact  Isaiah Public, LCSWA Phone Number: 11/21/2023, 10:26 AM  Clinical Narrative:     CSW spoke with Frankie with [facility] who informed CSW that patient has been approved for [facility] in [place]. Frankie informed CSW that they do not have a bed today, request for CSW to follow up tomorrow on bed availability. CSW informed MD and [name].                    Expected Discharge Plan and Services                                               Social Drivers of Health (SDOH) Interventions SDOH Screenings   Food Insecurity: No Food Insecurity (11/18/2023)  Housing: High Risk (11/18/2023)  Transportation Needs: No Transportation Needs (11/18/2023)  Utilities: Not At Risk (11/18/2023)  Depression (PHQ2-9): Low Risk  (04/30/2021)  Social Connections: Socially Isolated (11/18/2023)  Tobacco Use: Medium Risk (11/18/2023)    Readmission Risk Interventions    03/25/2023   10:24 AM 07/30/2022    2:17 PM  Readmission Risk Prevention Plan  Transportation Screening Complete Complete  PCP or Specialist Appt within 3-5 Days Complete Complete  HRI or Home Care Consult Complete   Social Work Consult for Recovery Care Planning/Counseling Complete Complete  Palliative Care Screening Complete Complete  Medication Review Oceanographer) Complete Complete

## 2023-11-22 DIAGNOSIS — I5033 Acute on chronic diastolic (congestive) heart failure: Secondary | ICD-10-CM | POA: Diagnosis not present

## 2023-11-22 DIAGNOSIS — I10 Essential (primary) hypertension: Secondary | ICD-10-CM | POA: Diagnosis not present

## 2023-11-22 DIAGNOSIS — I509 Heart failure, unspecified: Secondary | ICD-10-CM | POA: Diagnosis not present

## 2023-11-22 DIAGNOSIS — J9601 Acute respiratory failure with hypoxia: Secondary | ICD-10-CM | POA: Diagnosis not present

## 2023-11-22 DIAGNOSIS — Z515 Encounter for palliative care: Secondary | ICD-10-CM | POA: Diagnosis not present

## 2023-11-22 DIAGNOSIS — J810 Acute pulmonary edema: Secondary | ICD-10-CM | POA: Diagnosis not present

## 2023-11-22 MED ORDER — HYDROMORPHONE BOLUS VIA INFUSION
0.5000 mg | INTRAVENOUS | Status: DC | PRN
  Administered 2023-11-22 (×5): 0.5 mg via INTRAVENOUS

## 2023-11-28 NOTE — Progress Notes (Signed)
   12-19-23 1901  Spiritual Encounters  Type of Visit Initial  Care provided to: Family  Reason for visit End-of-life  OnCall Visit Yes   Upon request from nurse, Chaplain visited family of patient who had just died. Chaplain provided spiritual care and emotional comfort to family (6 people) and spoke to them about the patient who had died. The family had very strong faith as did the patient. The family shared that the night before the patient had reached out to the sky calling the name of her beloved husband who had passed and said that she saw Jesus. The family described the patient's death (and her life) as beautiful, full of love, and faith.  Chaplain Therisa Samuel

## 2023-11-28 NOTE — Progress Notes (Addendum)
 Palliative Medicine Inpatient Follow Up Note   HPI: 89 y.o. female  with past medical history of hypertension, hyperlipidemia, diabetes, CKD 3B, A-fib, CAD, chronic diastolic CHF, and anemia admitted on 11/18/2023 with shortness of breath.  She is being treated for acute respiratory failure secondary to acute on chronic diastolic CHF.  PMT consulted to discuss goals of care    Today's Discussion 12-15-2023  *Please note that this is a verbal dictation therefore any spelling or grammatical errors are due to the Dragon Medical One system interpretation.  Chart reviewed inclusive of vital signs, progress notes, laboratory results, and diagnostic images.   Visited the patient at bedside, where several family members were present, including her daughter, Jori. The patient appeared comfortable and in no visible distress. However, on closer assessment, she was noted to have intermittent deep breathing, slightly labored with prolonged pauses. She was unresponsive to verbal and tactile stimuli. Extremities were warm to touch. The patient remains on a Dilaudid  infusion at 0.5 mg/hour.  Jori shared that she and the rest of the family are at peace with honoring the patient's wishes for a comfort-focused approach. She inquired about the end-of-life process, and I explained that based on the patient's current presentation, her passing appears imminent, likely within hours to days. Jori also shared a meaningful experience from the previous night, where the patient appeared to have an encounter with her deceased husband, "Carlin," and was reportedly seeing other departed loved ones. I reassured her that such experiences are common near the end of life and often bring comfort. I emphasized the importance of the love and presence of family during this time. Jori expressed gratitude for the life they've shared with the patient and affirmed that the patient is ready for this transition.  The patient's goals  of care remain unchanged. She is designated as Do Not Resuscitate (DNR) with a focus on comfort measures only. All appropriate medications for symptom management are in place. The plan is to anticipate a hospital death. The family has expressed no interest in transferring the patient to an inpatient hospice facility at this time, as they feel it may cause unnecessary discomfort. I support this decision.  The family appears to be at peace with the decision to honor the patient's wishes for full comfort-focused care, recognizing the importance of preserving her dignity and quality of life during this final stage.  Questions and concerns addressed   Palliative Support Provided.   1732 Update: Received report from bedside RN regarding patient's increasing labored breathing. Dilaudid  infusion adjusted to up to 2mg /hr.  Objective Assessment: Vital Signs Vitals:   11/20/23 1616 11/20/23 2030  BP: (!) 159/59 (!) 152/117  Pulse:    Resp: 18 20  Temp: 98.6 F (37 C) 99.2 F (37.3 C)  SpO2:  96%    Intake/Output Summary (Last 24 hours) at 12-15-23 0849 Last data filed at 12-15-2023 0300 Gross per 24 hour  Intake 7.12 ml  Output 750 ml  Net -742.88 ml   Last Weight  Most recent update: 11/20/2023  6:12 AM    Weight  66 kg (145 lb 8.1 oz)             Review of Systems  Respiratory:  Positive for shortness of breath.   Cardiovascular:  Positive for chest pain.  Neurological:  Positive for weakness.   Physical Exam Constitutional:      General: She is not in acute distress.    Appearance: She is ill-appearing.  Pulmonary:  Comments: Breathing appears deep, slighlt labored with long pauses Skin:    General: Skin is warm and dry.  Neurological:     Mental Status: She is alert and oriented to person, place, and time.  Psychiatric:        Mood and Affect: Mood normal.        Behavior: Behavior normal.   SUMMARY OF RECOMMENDATIONS   Code Status: DNR-Comfort Maintain  comfort measures only DC all life-prolonging measures -to include insulin  and Lasix  -patient only wants medications to keep her comfortable at this point Maintain Dilaudid  infusion with as needed boluses. I increased the dilaudid  boluses to 0.5mg . At 1732, dilaudid  infusion dose increased to max of 2mg /hr As needed Ativan  I anticipate hospital death. Family wishes not to transfer patient to inpatient-hospice. I support this given patient's clinical presentation.   Time Spent: 35 minutes   Detailed review of medical records (labs, imaging, vital signs), medically appropriate exam, discussed with treatment team, counseling and education to patient, family, & staff, documenting clinical information, coordination of care.   ________________________________________________________________________ Kathlyne Bolder NP-C [place] Palliative Medicine Team Team Cell Phone: [phone] Please utilize secure chat with additional questions, if there is no response within 30 minutes please call the above phone number  Palliative Medicine Team providers are available by phone from 7am to 7pm daily and can be reached through the team cell phone.  Should this patient require assistance outside of these hours, please call the patient's attending physician.

## 2023-11-28 NOTE — Progress Notes (Addendum)
 [facility]  PROGRESS NOTE  Allison Duffy FMW:[id number] DOB: 12-12-1926 DOA: 11/18/2023 PCP: Shepard Ade, MD   Brief HPI:    89 y.o. female with medical history significant of hypertension, hyperlipidemia, diabetes, CKD 3B, A-fib, CAD, chronic diastolic CHF, anemia presenting with shortness of breath.   Patient was recently discharged from SNF a few days ago after insurance payments ran out and family had paid for 3 additional days.  Reportedly recently treated for pneumonia.  Patient had increased shortness of breath overnight and has been having some lower extremity edema.  Also reports some associated nausea and had some chest pressure on presentation which has improved.   Initially when patient arrived she had stated that she did not want any interventions done, but after daughter arrived and they had further discussion that she has opted for further interventions but does remain DNR/DNI. Chest x-ray showed mild pulmonary edema with small left pleural effusion. Also noted was retrocardiac opacity suspicious for atelectasis versus aspiration versus pneumonia.    Assessment/Plan:    Acute hypoxemic respiratory failure -Secondary to acute on chronic diastolic CHF - Presented with worsening shortness of breath, found to have lower extremity edema - BNP 240, chest x-ray showed pulm edema with small left pleural effusion - Diuresed well with Lasix  in the ED - Last echocardiogram was last month with EF of 50-60%, G3 DD, normal RV function. -Started on Lasix  60 mg IV twice daily - Lasix  has been discontinued, patient on Dilaudid  gtt. for comfort    CKD 3B > Creatinine stable at 1.35 -   Atrial fibrillation - Eliquis  has been discontinued   CAD - Home meds discontinued as patient is on comfort measures only   Goals of care - Discussed with patient, her daughter and granddaughter at bedside.  Patient is DNR.  Family wants to focus on comfort.  Started on Dilaudid   infusion .patient is now reclining and approaching end-of-life.  Anticipate hospital death in the next 24 to 48 hours.    DVT prophylaxis: Apixaban   Medications     antiseptic oral rinse  15 mL Topical TID   senna  2 tablet Oral QHS   sodium chloride  flush  3 mL Intravenous Q12H     Data Reviewed:   CBG:  Recent Labs  Lab 11/19/23 1136 11/19/23 1528 11/19/23 2153 11/20/23 0808 11/20/23 1231  GLUCAP 158* 212* 193* 253* 144*    SpO2: 96 % O2 Flow Rate (L/min): 3.5 L/min    Vitals:   11/20/23 1234 11/20/23 1414 11/20/23 1616 11/20/23 2030  BP:  (!) 155/55 (!) 159/59 (!) 152/117  Pulse:      Resp: 17  18 20   Temp: 98.4 F (36.9 C)  98.6 F (37 C) 99.2 F (37.3 C)  TempSrc: Oral  Oral Oral  SpO2:    96%  Weight:      Height:          Data Reviewed:  Basic Metabolic Panel: Recent Labs  Lab 11/18/23 1140 11/18/23 1149 11/18/23 1754 11/19/23 0407 11/20/23 0356  NA 135 141  --  140 139  K 3.4* 3.6  --  3.2* 3.3*  CL 105 106  --  104 101  CO2 20*  --   --  23 25  GLUCOSE 230* 245*  --  156* 181*  BUN 22 23  --  18 21  CREATININE 1.35* 1.30*  --  1.35* 1.37*  CALCIUM  8.5*  --   --  8.4* 8.5*  MG  --   --  1.7  --   --     CBC: Recent Labs  Lab 11/18/23 1140 11/18/23 1149 11/19/23 0407  WBC 9.1  --  6.5  NEUTROABS 6.5  --   --   HGB 12.2 11.6* 11.2*  HCT 36.2 34.0* 33.2*  MCV 102.0*  --  101.5*  PLT 229  --  202    LFT Recent Labs  Lab 11/18/23 1140 11/19/23 0407  AST 20 18  ALT 16 14  ALKPHOS 49 35*  BILITOT 1.1 1.2  PROT 7.2 6.2*  ALBUMIN  3.8 3.1*     Antibiotics: Anti-infectives (From admission, onward)    None        CONSULTS palliative care  Code Status: DNR  Family Communication: Discussed with patient's daughter and granddaughter at bedside     Subjective   Currently on IV Dilaudid  infusion for comfort.  Has been lethargic   Objective    Physical Examination:  Appears lethargic Chyne Stokes  breathing S1-S2, regular Extremities no edema  Status is: Inpatient:             Allison Duffy   [facility] If 7PM-7AM, please contact night-coverage at [email or web address], Office  [phone]   November 25, 2023, 9:08 AM  LOS: 3 days

## 2023-11-28 NOTE — Progress Notes (Signed)
 60.5mg  dilaudid  wasted in steri-cycle with Harlene Kraft, RN.

## 2023-11-28 NOTE — Discharge Summary (Signed)
 Death Summary  Allison Duffy FMW:[id number] DOB: [birth year] DOA: 11/27/23  PCP: Shepard Ade, MD   Admit date: 27-Nov-2023 Date of Death: December 02, 2023  Final Diagnoses:  Active Problems:   Coronary artery disease   Type 2 diabetes mellitus with complication, without long-term current use of insulin  (HCC)   Essential hypertension   Hyperlipidemia associated with type 2 diabetes mellitus (HCC)   Chronic diastolic HF (heart failure) (HCC)   Paroxysmal atrial fibrillation (HCC)   CKD stage 3b, GFR 30-44 ml/min (HCC)   Anemia   Acute respiratory failure with hypoxia (HCC)   Acute on chronic diastolic (congestive) heart failure (HCC)      History of present illness:  89 y.o. female with medical history significant of hypertension, hyperlipidemia, diabetes, CKD 3B, A-fib, CAD, chronic diastolic CHF, anemia presenting with shortness of breath.   Patient was recently discharged from SNF a few days ago after insurance payments ran out and family had paid for 3 additional days.  Reportedly recently treated for pneumonia.  Patient had increased shortness of breath overnight and has been having some lower extremity edema.  Also reports some associated nausea and had some chest pressure on presentation which has improved.   Initially when patient arrived she had stated that she did not want any interventions done, but after daughter arrived and they had further discussion that she has opted for further interventions but does remain DNR/DNI. Chest x-ray showed mild pulmonary edema with small left pleural effusion. Also noted was retrocardiac opacity suspicious for atelectasis versus aspiration versus pneumonia.   Hospital Course:   Acute hypoxemic respiratory failure -Secondary to acute on chronic diastolic CHF - Presented with worsening shortness of breath, found to have lower extremity edema - BNP 240, chest x-ray showed pulm edema with small left pleural effusion - Diuresed well with  Lasix  in the ED - Last echocardiogram was last month with EF of 50-60%, G3 DD, normal RV function. -Started on Lasix  60 mg IV twice daily -After discussion with palliative care, family decided full comfort measures -Lasix  was discontinued, patient started on Dilaudid  gtt. for comfort    -   Atrial fibrillation - Eliquis  was discontinued   CAD - Home meds discontinued as patient is on comfort measures only   Goals of care - Discussed with patient, her daughter and granddaughter at bedside.  Patient is DNR.  Family wants to focus on comfort.  Started on Dilaudid  infusion .patient died in the hospital on 01-Dec-2023 at 1816   Time: Time of death 12-10-1814  Signed:  [name]  [facility] 02-Dec-2023, 1:10 PM

## 2023-11-28 DEATH — deceased
